# Patient Record
Sex: Male | Born: 1945 | Race: White | Hispanic: No | Marital: Married
Health system: Southern US, Academic
[De-identification: ages and names within clinical notes are randomized; demographics above are authoritative.]

## PROBLEM LIST (undated history)

## (undated) ENCOUNTER — Ambulatory Visit

## (undated) ENCOUNTER — Encounter

## (undated) ENCOUNTER — Encounter: Attending: "Endocrinology | Primary: "Endocrinology

## (undated) ENCOUNTER — Telehealth

## (undated) ENCOUNTER — Ambulatory Visit: Payer: MEDICARE | Attending: Internal Medicine | Primary: Internal Medicine

## (undated) ENCOUNTER — Ambulatory Visit: Payer: MEDICARE

## (undated) ENCOUNTER — Encounter: Attending: Nephrology | Primary: Nephrology

## (undated) ENCOUNTER — Telehealth: Attending: "Endocrinology | Primary: "Endocrinology

## (undated) ENCOUNTER — Ambulatory Visit: Payer: MEDICARE | Attending: Nephrology | Primary: Nephrology

## (undated) ENCOUNTER — Telehealth: Attending: Vascular Surgery | Primary: Vascular Surgery

## (undated) ENCOUNTER — Encounter: Attending: Internal Medicine | Primary: Internal Medicine

## (undated) ENCOUNTER — Ambulatory Visit: Payer: MEDICARE | Attending: Vascular Surgery | Primary: Vascular Surgery

## (undated) ENCOUNTER — Institutional Professional Consult (permissible substitution): Payer: MEDICARE | Attending: "Endocrinology | Primary: "Endocrinology

## (undated) ENCOUNTER — Ambulatory Visit: Payer: MEDICARE | Attending: "Endocrinology | Primary: "Endocrinology

## (undated) ENCOUNTER — Ambulatory Visit
Payer: MEDICARE | Attending: Student in an Organized Health Care Education/Training Program | Primary: Student in an Organized Health Care Education/Training Program

## (undated) DIAGNOSIS — M1990 Unspecified osteoarthritis, unspecified site: Secondary | ICD-10-CM

## (undated) DIAGNOSIS — G4730 Sleep apnea, unspecified: Secondary | ICD-10-CM

## (undated) DIAGNOSIS — L570 Actinic keratosis: Secondary | ICD-10-CM

## (undated) DIAGNOSIS — Z940 Kidney transplant status: Secondary | ICD-10-CM

## (undated) DIAGNOSIS — I639 Cerebral infarction, unspecified: Secondary | ICD-10-CM

## (undated) DIAGNOSIS — I2699 Other pulmonary embolism without acute cor pulmonale: Secondary | ICD-10-CM

## (undated) DIAGNOSIS — N184 Chronic kidney disease, stage 4 (severe): Secondary | ICD-10-CM

## (undated) DIAGNOSIS — I10 Essential (primary) hypertension: Secondary | ICD-10-CM

## (undated) DIAGNOSIS — G5790 Unspecified mononeuropathy of unspecified lower limb: Secondary | ICD-10-CM

## (undated) HISTORY — PX: KIDNEY TRANSPLANT: SHX239

## (undated) HISTORY — DX: Unspecified mononeuropathy of unspecified lower limb: G57.90

## (undated) HISTORY — DX: Unspecified osteoarthritis, unspecified site: M19.90

## (undated) HISTORY — DX: Essential (primary) hypertension: I10

## (undated) HISTORY — DX: Cerebral infarction, unspecified: I63.9

## (undated) HISTORY — PX: TOE SURGERY: SHX1073

## (undated) HISTORY — DX: Kidney transplant status: Z94.0

## (undated) HISTORY — DX: Chronic kidney disease, stage 4 (severe): N18.4

## (undated) HISTORY — DX: Sleep apnea, unspecified: G47.30

## (undated) HISTORY — DX: Actinic keratosis: L57.0

## (undated) MED ORDER — ASPIRIN 81 MG TABLET,DELAYED RELEASE: Freq: Every day | ORAL | 0 days | PRN

---
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
## 1977-06-04 HISTORY — PX: VASECTOMY: SHX75

## 2006-04-02 ENCOUNTER — Ambulatory Visit: Payer: Self-pay | Admitting: Gastroenterology

## 2006-04-24 ENCOUNTER — Ambulatory Visit: Payer: Self-pay | Admitting: Gastroenterology

## 2006-06-04 HISTORY — PX: KNEE ARTHROSCOPY: SUR90

## 2006-10-22 ENCOUNTER — Ambulatory Visit: Payer: Self-pay

## 2006-11-12 ENCOUNTER — Ambulatory Visit: Payer: Self-pay | Admitting: Orthopaedic Surgery

## 2006-11-22 ENCOUNTER — Ambulatory Visit: Payer: Self-pay | Admitting: Orthopaedic Surgery

## 2006-11-26 ENCOUNTER — Ambulatory Visit: Payer: Self-pay | Admitting: Orthopaedic Surgery

## 2006-12-24 ENCOUNTER — Ambulatory Visit: Payer: Self-pay | Admitting: Internal Medicine

## 2007-06-05 HISTORY — PX: CATARACT EXTRACTION: SUR2

## 2008-01-16 ENCOUNTER — Ambulatory Visit: Payer: Self-pay | Admitting: Urology

## 2008-07-08 ENCOUNTER — Telehealth: Payer: Self-pay | Admitting: Gastroenterology

## 2009-02-28 ENCOUNTER — Ambulatory Visit: Payer: Self-pay | Admitting: Ophthalmology

## 2010-08-25 ENCOUNTER — Ambulatory Visit: Payer: Self-pay

## 2010-09-03 ENCOUNTER — Ambulatory Visit: Payer: Self-pay

## 2010-10-03 ENCOUNTER — Ambulatory Visit: Payer: Self-pay

## 2011-01-23 ENCOUNTER — Encounter: Payer: Self-pay | Admitting: Gastroenterology

## 2011-02-22 ENCOUNTER — Ambulatory Visit (AMBULATORY_SURGERY_CENTER): Payer: BC Managed Care – PPO | Admitting: *Deleted

## 2011-02-22 ENCOUNTER — Encounter: Payer: Self-pay | Admitting: Gastroenterology

## 2011-02-22 VITALS — Ht 73.0 in | Wt 258.5 lb

## 2011-02-22 DIAGNOSIS — Z1211 Encounter for screening for malignant neoplasm of colon: Secondary | ICD-10-CM

## 2011-02-22 MED ORDER — PEG-KCL-NACL-NASULF-NA ASC-C 100 G PO SOLR: ORAL | Status: DC

## 2011-03-09 ENCOUNTER — Encounter: Payer: Self-pay | Admitting: Gastroenterology

## 2011-03-09 ENCOUNTER — Ambulatory Visit (AMBULATORY_SURGERY_CENTER): Payer: BC Managed Care – PPO | Admitting: Gastroenterology

## 2011-03-09 VITALS — BP 144/68 | HR 65 | Temp 98.7°F | Resp 14 | Ht 73.0 in | Wt 258.0 lb

## 2011-03-09 DIAGNOSIS — D126 Benign neoplasm of colon, unspecified: Secondary | ICD-10-CM | POA: Insufficient documentation

## 2011-03-09 DIAGNOSIS — Z860100 Personal history of colon polyps, unspecified: Secondary | ICD-10-CM | POA: Insufficient documentation

## 2011-03-09 DIAGNOSIS — Z1211 Encounter for screening for malignant neoplasm of colon: Secondary | ICD-10-CM

## 2011-03-09 DIAGNOSIS — K5730 Diverticulosis of large intestine without perforation or abscess without bleeding: Secondary | ICD-10-CM | POA: Insufficient documentation

## 2011-03-09 MED ORDER — SODIUM CHLORIDE 0.9 % IV SOLN: 500.0000 mL | INTRAVENOUS | Status: DC

## 2011-03-09 NOTE — Patient Instructions (Signed)
Moderate diverticulosis, multiple polyps, await pathology results  See blue and green sheets for additional d/c instructions.

## 2011-03-09 NOTE — Progress Notes (Signed)
Pt states yesterday with first bowel prep passed out about 5pm and when wife returned home at 7pm was still unresponsive. Called ems and they arrived and determined pts blood sugar low. Dr Henrene Pastor notified of episode last pm, pt instructed to continue prep as directed per pt and pt did with no further complications. Blood sugar in admitting was 234. Pt alert and denies and problems. EWM

## 2011-03-12 ENCOUNTER — Telehealth: Payer: Self-pay | Admitting: *Deleted

## 2011-03-12 DIAGNOSIS — D126 Benign neoplasm of colon, unspecified: Secondary | ICD-10-CM

## 2011-03-12 NOTE — Telephone Encounter (Signed)

## 2011-03-13 ENCOUNTER — Encounter: Payer: Self-pay | Admitting: Gastroenterology

## 2011-06-12 ENCOUNTER — Emergency Department: Payer: Self-pay | Admitting: Emergency Medicine

## 2011-06-13 LAB — COMPREHENSIVE METABOLIC PANEL
Albumin: 3.6 g/dL (ref 3.4–5.0)
Alkaline Phosphatase: 67 U/L (ref 50–136)
BUN: 99 mg/dL — ABNORMAL HIGH (ref 7–18)
Bilirubin,Total: 0.4 mg/dL (ref 0.2–1.0)
Co2: 24 mmol/L (ref 21–32)
Creatinine: 4.36 mg/dL — ABNORMAL HIGH (ref 0.60–1.30)
EGFR (African American): 18 — ABNORMAL LOW
EGFR (Non-African Amer.): 15 — ABNORMAL LOW
SGOT(AST): 18 U/L (ref 15–37)
SGPT (ALT): 23 U/L
Total Protein: 7.3 g/dL (ref 6.4–8.2)

## 2011-06-13 LAB — CBC WITH DIFFERENTIAL/PLATELET
Basophil #: 0 10*3/uL (ref 0.0–0.1)
Eosinophil #: 0 10*3/uL (ref 0.0–0.7)
Eosinophil %: 0.1 %
HGB: 11.2 g/dL — ABNORMAL LOW (ref 13.0–18.0)
MCH: 29 pg (ref 26.0–34.0)
MCHC: 33.8 g/dL (ref 32.0–36.0)
MCV: 86 fL (ref 80–100)
Neutrophil #: 7.4 10*3/uL — ABNORMAL HIGH (ref 1.4–6.5)
Neutrophil %: 87.9 %
Platelet: 150 10*3/uL (ref 150–440)
RBC: 3.86 10*6/uL — ABNORMAL LOW (ref 4.40–5.90)
RDW: 14.1 % (ref 11.5–14.5)

## 2011-06-13 LAB — CK TOTAL AND CKMB (NOT AT ARMC)
CK, Total: 164 U/L (ref 35–232)
CK-MB: 5 ng/mL — ABNORMAL HIGH (ref 0.5–3.6)

## 2011-06-13 LAB — TROPONIN I: Troponin-I: 0.03 ng/mL

## 2011-06-13 LAB — PRO B NATRIURETIC PEPTIDE: B-Type Natriuretic Peptide: 1872 pg/mL — ABNORMAL HIGH (ref 0–125)

## 2011-08-27 ENCOUNTER — Other Ambulatory Visit: Payer: Self-pay

## 2011-08-27 LAB — CBC WITH DIFFERENTIAL/PLATELET
Basophil #: 0 10*3/uL (ref 0.0–0.1)
HCT: 40.4 % (ref 40.0–52.0)
Lymphocyte %: 0.6 %
MCH: 27.5 pg (ref 26.0–34.0)
MCV: 86 fL (ref 80–100)
Monocyte #: 0.2 10*3/uL (ref 0.0–0.7)
Monocyte %: 1.3 %
Neutrophil #: 17.4 10*3/uL — ABNORMAL HIGH (ref 1.4–6.5)
Neutrophil %: 98.1 %
RDW: 14.2 % (ref 11.5–14.5)

## 2011-08-27 LAB — BASIC METABOLIC PANEL
Anion Gap: 11 (ref 7–16)
BUN: 47 mg/dL — ABNORMAL HIGH (ref 7–18)
Creatinine: 1.4 mg/dL — ABNORMAL HIGH (ref 0.60–1.30)
Glucose: 176 mg/dL — ABNORMAL HIGH (ref 65–99)
Osmolality: 294 (ref 275–301)
Potassium: 5.2 mmol/L — ABNORMAL HIGH (ref 3.5–5.1)
Sodium: 139 mmol/L (ref 136–145)

## 2011-08-27 LAB — MAGNESIUM: Magnesium: 1.9 mg/dL

## 2011-08-27 LAB — PROTIME-INR
INR: 1.7
Prothrombin Time: 20.7 secs — ABNORMAL HIGH (ref 11.5–14.7)

## 2011-08-29 LAB — CBC WITH DIFFERENTIAL/PLATELET
Eosinophil %: 0 %
HCT: 37.1 % — ABNORMAL LOW (ref 40.0–52.0)
HGB: 12.4 g/dL — ABNORMAL LOW (ref 13.0–18.0)
Lymphocyte #: 0.1 10*3/uL — ABNORMAL LOW (ref 1.0–3.6)
MCH: 28.6 pg (ref 26.0–34.0)
MCHC: 33.5 g/dL (ref 32.0–36.0)
MCV: 85 fL (ref 80–100)
Monocyte %: 2.6 %
Neutrophil %: 96.3 %

## 2011-08-29 LAB — BASIC METABOLIC PANEL
Anion Gap: 10 (ref 7–16)
Chloride: 102 mmol/L (ref 98–107)
Co2: 25 mmol/L (ref 21–32)
Creatinine: 1.65 mg/dL — ABNORMAL HIGH (ref 0.60–1.30)
EGFR (African American): 54 — ABNORMAL LOW
Glucose: 105 mg/dL — ABNORMAL HIGH (ref 65–99)

## 2011-08-29 LAB — MAGNESIUM: Magnesium: 1.8 mg/dL

## 2011-08-29 LAB — PROTIME-INR: Prothrombin Time: 25.2 secs — ABNORMAL HIGH (ref 11.5–14.7)

## 2011-09-03 ENCOUNTER — Other Ambulatory Visit: Payer: Self-pay

## 2011-09-03 LAB — CBC WITH DIFFERENTIAL/PLATELET
Basophil #: 0 10*3/uL (ref 0.0–0.1)
Basophil %: 0.1 %
Eosinophil %: 0.1 %
HCT: 38.7 % — ABNORMAL LOW (ref 40.0–52.0)
Lymphocyte #: 0.1 10*3/uL — ABNORMAL LOW (ref 1.0–3.6)
Lymphocyte %: 1.1 %
MCHC: 32.6 g/dL (ref 32.0–36.0)
Monocyte #: 0.6 10*3/uL (ref 0.0–0.7)
Monocyte %: 4.6 %
Neutrophil #: 11.7 10*3/uL — ABNORMAL HIGH (ref 1.4–6.5)
Neutrophil %: 94.1 %
RBC: 4.42 10*6/uL (ref 4.40–5.90)
RDW: 15 % — ABNORMAL HIGH (ref 11.5–14.5)
WBC: 12.4 10*3/uL — ABNORMAL HIGH (ref 3.8–10.6)

## 2011-09-03 LAB — BASIC METABOLIC PANEL
BUN: 9 mg/dL (ref 7–18)
Creatinine: 1.09 mg/dL (ref 0.60–1.30)
EGFR (African American): >60
EGFR (Non-African Amer.): >60
Glucose: 261 mg/dL — ABNORMAL HIGH (ref 65–99)
Osmolality: 280 (ref 275–301)
Potassium: 4.8 mmol/L (ref 3.5–5.1)

## 2011-09-03 LAB — MAGNESIUM: Magnesium: 1.1 mg/dL — ABNORMAL LOW

## 2011-09-05 LAB — CBC WITH DIFFERENTIAL/PLATELET
Basophil #: 0 10*3/uL (ref 0.0–0.1)
Basophil %: 0 %
HCT: 36.3 % — ABNORMAL LOW (ref 40.0–52.0)
Lymphocyte #: 0.1 10*3/uL — ABNORMAL LOW (ref 1.0–3.6)
MCH: 28.6 pg (ref 26.0–34.0)
MCHC: 33.7 g/dL (ref 32.0–36.0)
MCV: 85 fL (ref 80–100)
Monocyte #: 0.4 10*3/uL (ref 0.0–0.7)
Neutrophil %: 93.4 %
RBC: 4.27 10*6/uL — ABNORMAL LOW (ref 4.40–5.90)
RDW: 16.1 % — ABNORMAL HIGH (ref 11.5–14.5)
WBC: 8.1 10*3/uL (ref 3.8–10.6)

## 2011-09-05 LAB — MAGNESIUM: Magnesium: 1 mg/dL — ABNORMAL LOW

## 2011-09-05 LAB — BASIC METABOLIC PANEL
BUN: 15 mg/dL (ref 7–18)
Chloride: 102 mmol/L (ref 98–107)
Co2: 25 mmol/L (ref 21–32)
Creatinine: 0.86 mg/dL (ref 0.60–1.30)
Osmolality: 275 (ref 275–301)
Potassium: 5 mmol/L (ref 3.5–5.1)

## 2011-09-05 LAB — PHOSPHORUS: Phosphorus: 2.5 mg/dL (ref 2.5–4.9)

## 2011-09-10 LAB — CBC WITH DIFFERENTIAL/PLATELET
Basophil #: 0 10*3/uL (ref 0.0–0.1)
Eosinophil %: 0.2 %
HGB: 12.8 g/dL — ABNORMAL LOW (ref 13.0–18.0)
Lymphocyte %: 1.6 %
MCH: 28.7 pg (ref 26.0–34.0)
MCV: 86 fL (ref 80–100)
Monocyte #: 0.5 10*3/uL (ref 0.0–0.7)
Monocyte %: 5.1 %
Neutrophil %: 93.1 %
Platelet: 320 10*3/uL (ref 150–440)
RBC: 4.46 10*6/uL (ref 4.40–5.90)
RDW: 15.9 % — ABNORMAL HIGH (ref 11.5–14.5)

## 2011-09-10 LAB — BASIC METABOLIC PANEL
BUN: 22 mg/dL — ABNORMAL HIGH (ref 7–18)
Calcium, Total: 8.6 mg/dL (ref 8.5–10.1)
Glucose: 216 mg/dL — ABNORMAL HIGH (ref 65–99)
Osmolality: 284 (ref 275–301)
Sodium: 137 mmol/L (ref 136–145)

## 2011-09-10 LAB — MAGNESIUM: Magnesium: 1.2 mg/dL — ABNORMAL LOW

## 2011-09-12 LAB — BASIC METABOLIC PANEL
Anion Gap: 7 (ref 7–16)
Chloride: 103 mmol/L (ref 98–107)
Co2: 28 mmol/L (ref 21–32)
Creatinine: 1.23 mg/dL (ref 0.60–1.30)
EGFR (African American): >60
Glucose: 178 mg/dL — ABNORMAL HIGH (ref 65–99)
Osmolality: 285 (ref 275–301)
Potassium: 5.4 mmol/L — ABNORMAL HIGH (ref 3.5–5.1)
Sodium: 138 mmol/L (ref 136–145)

## 2011-09-12 LAB — CBC WITH DIFFERENTIAL/PLATELET
Eosinophil #: 0 10*3/uL (ref 0.0–0.7)
Eosinophil %: 0.2 %
HCT: 38.1 % — ABNORMAL LOW (ref 40.0–52.0)
HGB: 12.5 g/dL — ABNORMAL LOW (ref 13.0–18.0)
Lymphocyte %: 2.6 %
MCH: 28.4 pg (ref 26.0–34.0)
MCV: 87 fL (ref 80–100)
Monocyte #: 0.5 x10 3/mm (ref 0.2–1.0)
Monocyte %: 5.8 %
Neutrophil #: 7.2 10*3/uL — ABNORMAL HIGH (ref 1.4–6.5)
Neutrophil %: 91.4 %

## 2011-09-12 LAB — MAGNESIUM: Magnesium: 1.3 mg/dL — ABNORMAL LOW

## 2011-09-12 LAB — PROTIME-INR: Prothrombin Time: 22.5 secs — ABNORMAL HIGH (ref 11.5–14.7)

## 2011-09-14 LAB — CBC WITH DIFFERENTIAL/PLATELET
Basophil #: 0 10*3/uL (ref 0.0–0.1)
Basophil %: 0.2 %
Eosinophil %: 0.6 %
HGB: 12.5 g/dL — ABNORMAL LOW (ref 13.0–18.0)
MCH: 27.9 pg (ref 26.0–34.0)
MCHC: 33.2 g/dL (ref 32.0–36.0)
MCV: 84 fL (ref 80–100)
Monocyte #: 0.6 x10 3/mm (ref 0.2–1.0)
Monocyte %: 8.7 %
Neutrophil %: 88.7 %
Platelet: 313 10*3/uL (ref 150–440)
RDW: 15.7 % — ABNORMAL HIGH (ref 11.5–14.5)

## 2011-09-14 LAB — BASIC METABOLIC PANEL
Chloride: 99 mmol/L (ref 98–107)
Creatinine: 1.17 mg/dL (ref 0.60–1.30)
EGFR (African American): >60
EGFR (Non-African Amer.): >60
Glucose: 291 mg/dL — ABNORMAL HIGH (ref 65–99)
Osmolality: 281 (ref 275–301)
Potassium: 4.8 mmol/L (ref 3.5–5.1)
Sodium: 133 mmol/L — ABNORMAL LOW (ref 136–145)

## 2011-09-14 LAB — PROTIME-INR
INR: 2
Prothrombin Time: 22.9 secs — ABNORMAL HIGH (ref 11.5–14.7)

## 2011-09-14 LAB — PHOSPHORUS: Phosphorus: 2.7 mg/dL (ref 2.5–4.9)

## 2011-09-14 LAB — MAGNESIUM: Magnesium: 1.3 mg/dL — ABNORMAL LOW

## 2011-09-19 LAB — BASIC METABOLIC PANEL
Anion Gap: 8 (ref 7–16)
BUN: 22 mg/dL — ABNORMAL HIGH (ref 7–18)
Calcium, Total: 8.9 mg/dL (ref 8.5–10.1)
Chloride: 99 mmol/L (ref 98–107)
Co2: 28 mmol/L (ref 21–32)
Creatinine: 1.24 mg/dL (ref 0.60–1.30)
EGFR (Non-African Amer.): >60
Glucose: 300 mg/dL — ABNORMAL HIGH (ref 65–99)
Osmolality: 285 (ref 275–301)
Sodium: 135 mmol/L — ABNORMAL LOW (ref 136–145)

## 2011-09-19 LAB — PROTIME-INR
INR: 1.9
Prothrombin Time: 22 secs — ABNORMAL HIGH (ref 11.5–14.7)

## 2011-09-19 LAB — PHOSPHORUS: Phosphorus: 3.2 mg/dL (ref 2.5–4.9)

## 2011-09-19 LAB — CBC WITH DIFFERENTIAL/PLATELET
Eosinophil %: 0.4 %
HGB: 12.7 g/dL — ABNORMAL LOW (ref 13.0–18.0)
Lymphocyte #: 0.2 10*3/uL — ABNORMAL LOW (ref 1.0–3.6)
Lymphocyte %: 2.4 %
MCHC: 33.3 g/dL (ref 32.0–36.0)
Monocyte %: 10.1 %
Platelet: 344 10*3/uL (ref 150–440)
RBC: 4.51 10*6/uL (ref 4.40–5.90)
RDW: 15.8 % — ABNORMAL HIGH (ref 11.5–14.5)
WBC: 7.7 10*3/uL (ref 3.8–10.6)

## 2011-09-19 LAB — MAGNESIUM: Magnesium: 1.3 mg/dL — ABNORMAL LOW

## 2011-09-21 LAB — BASIC METABOLIC PANEL
Anion Gap: 8 (ref 7–16)
BUN: 22 mg/dL — ABNORMAL HIGH (ref 7–18)
Creatinine: 1.27 mg/dL (ref 0.60–1.30)
EGFR (Non-African Amer.): 59 — ABNORMAL LOW
Glucose: 278 mg/dL — ABNORMAL HIGH (ref 65–99)

## 2011-09-21 LAB — CBC WITH DIFFERENTIAL/PLATELET
Basophil #: 0 10*3/uL (ref 0.0–0.1)
Basophil %: 0.3 %
HCT: 38.3 % — ABNORMAL LOW (ref 40.0–52.0)
Lymphocyte %: 1.9 %
MCH: 27.7 pg (ref 26.0–34.0)
MCHC: 33 g/dL (ref 32.0–36.0)
MCV: 84 fL (ref 80–100)
Monocyte #: 0.8 x10 3/mm (ref 0.2–1.0)
Monocyte %: 8.9 %
Neutrophil #: 7.4 10*3/uL — ABNORMAL HIGH (ref 1.4–6.5)
RDW: 15.7 % — ABNORMAL HIGH (ref 11.5–14.5)
WBC: 8.4 10*3/uL (ref 3.8–10.6)

## 2011-09-21 LAB — PROTIME-INR
INR: 2
Prothrombin Time: 23.3 secs — ABNORMAL HIGH (ref 11.5–14.7)

## 2011-09-21 LAB — MAGNESIUM: Magnesium: 1.3 mg/dL — ABNORMAL LOW

## 2011-09-21 LAB — PHOSPHORUS: Phosphorus: 2.9 mg/dL (ref 2.5–4.9)

## 2011-09-24 LAB — CBC WITH DIFFERENTIAL/PLATELET
Basophil %: 0.3 %
Eosinophil #: 0.1 10*3/uL (ref 0.0–0.7)
HGB: 13 g/dL (ref 13.0–18.0)
Lymphocyte #: 0.2 10*3/uL — ABNORMAL LOW (ref 1.0–3.6)
MCV: 84 fL (ref 80–100)
Monocyte %: 7.5 %
Neutrophil #: 10.6 10*3/uL — ABNORMAL HIGH (ref 1.4–6.5)
Neutrophil %: 89.5 %
Platelet: 368 10*3/uL (ref 150–440)

## 2011-09-24 LAB — PROTIME-INR: INR: 2.1

## 2011-09-24 LAB — BASIC METABOLIC PANEL
Anion Gap: 7 (ref 7–16)
BUN: 24 mg/dL — ABNORMAL HIGH (ref 7–18)
Chloride: 101 mmol/L (ref 98–107)
Co2: 28 mmol/L (ref 21–32)
Creatinine: 1.24 mg/dL (ref 0.60–1.30)
EGFR (Non-African Amer.): >60
Glucose: 255 mg/dL — ABNORMAL HIGH (ref 65–99)
Osmolality: 285 (ref 275–301)
Sodium: 136 mmol/L (ref 136–145)

## 2011-09-28 LAB — BASIC METABOLIC PANEL
Anion Gap: 11 (ref 7–16)
BUN: 24 mg/dL — ABNORMAL HIGH (ref 7–18)
Chloride: 101 mmol/L (ref 98–107)
Creatinine: 1.28 mg/dL (ref 0.60–1.30)
Potassium: 5 mmol/L (ref 3.5–5.1)
Sodium: 136 mmol/L (ref 136–145)

## 2011-09-28 LAB — CBC WITH DIFFERENTIAL/PLATELET
Basophil #: 0 10*3/uL (ref 0.0–0.1)
Basophil %: 0.2 %
HCT: 38.3 % — ABNORMAL LOW (ref 40.0–52.0)
Lymphocyte #: 0.2 10*3/uL — ABNORMAL LOW (ref 1.0–3.6)
Lymphocyte %: 2.3 %
MCHC: 33.7 g/dL (ref 32.0–36.0)
MCV: 85 fL (ref 80–100)
Monocyte #: 0.6 x10 3/mm (ref 0.2–1.0)
Neutrophil %: 90.7 %
Platelet: 286 10*3/uL (ref 150–440)
RDW: 15.9 % — ABNORMAL HIGH (ref 11.5–14.5)
WBC: 9.9 10*3/uL (ref 3.8–10.6)

## 2011-09-28 LAB — PHOSPHORUS: Phosphorus: 3.1 mg/dL (ref 2.5–4.9)

## 2011-09-28 LAB — MAGNESIUM: Magnesium: 1.4 mg/dL — ABNORMAL LOW

## 2011-09-28 LAB — APTT: Activated PTT: 42.7 secs — ABNORMAL HIGH (ref 23.6–35.9)

## 2011-09-28 LAB — PROTIME-INR: Prothrombin Time: 24 secs — ABNORMAL HIGH (ref 11.5–14.7)

## 2011-10-01 LAB — CBC WITH DIFFERENTIAL/PLATELET
Basophil #: 0 10*3/uL (ref 0.0–0.1)
Eosinophil #: 0.2 10*3/uL (ref 0.0–0.7)
Eosinophil %: 1.7 %
HCT: 38 % — ABNORMAL LOW (ref 40.0–52.0)
HGB: 12.9 g/dL — ABNORMAL LOW (ref 13.0–18.0)
Lymphocyte %: 1.9 %
MCH: 28.4 pg (ref 26.0–34.0)
MCHC: 34.1 g/dL (ref 32.0–36.0)
MCV: 83 fL (ref 80–100)
Monocyte %: 6.5 %
Neutrophil %: 89.6 %
Platelet: 276 10*3/uL (ref 150–440)
RBC: 4.55 10*6/uL (ref 4.40–5.90)
WBC: 10.1 10*3/uL (ref 3.8–10.6)

## 2011-10-01 LAB — BASIC METABOLIC PANEL
Anion Gap: 8 (ref 7–16)
Calcium, Total: 9 mg/dL (ref 8.5–10.1)
Chloride: 102 mmol/L (ref 98–107)
Co2: 27 mmol/L (ref 21–32)
Creatinine: 1.1 mg/dL (ref 0.60–1.30)
EGFR (Non-African Amer.): >60
Glucose: 253 mg/dL — ABNORMAL HIGH (ref 65–99)
Osmolality: 286 (ref 275–301)
Potassium: 4.9 mmol/L (ref 3.5–5.1)

## 2011-10-01 LAB — PROTIME-INR: INR: 1.7

## 2011-10-03 ENCOUNTER — Other Ambulatory Visit: Payer: Self-pay

## 2011-10-08 LAB — CBC WITH DIFFERENTIAL/PLATELET
Basophil %: 0.1 %
Eosinophil %: 2.7 %
HCT: 37.1 % — ABNORMAL LOW (ref 40.0–52.0)
HGB: 12.7 g/dL — ABNORMAL LOW (ref 13.0–18.0)
Lymphocyte #: 0.3 10*3/uL — ABNORMAL LOW (ref 1.0–3.6)
Lymphocyte %: 3.8 %
MCV: 84 fL (ref 80–100)
Monocyte #: 0.5 x10 3/mm (ref 0.2–1.0)
Monocyte %: 6.6 %
Neutrophil #: 6.8 10*3/uL — ABNORMAL HIGH (ref 1.4–6.5)
Platelet: 265 10*3/uL (ref 150–440)
RBC: 4.4 10*6/uL (ref 4.40–5.90)

## 2011-10-08 LAB — BASIC METABOLIC PANEL
BUN: 21 mg/dL — ABNORMAL HIGH (ref 7–18)
Calcium, Total: 8.5 mg/dL (ref 8.5–10.1)
Osmolality: 290 (ref 275–301)
Potassium: 4.2 mmol/L (ref 3.5–5.1)
Sodium: 140 mmol/L (ref 136–145)

## 2011-10-08 LAB — MAGNESIUM: Magnesium: 1.2 mg/dL — ABNORMAL LOW

## 2011-10-15 LAB — CBC WITH DIFFERENTIAL/PLATELET
Basophil #: 0 10*3/uL (ref 0.0–0.1)
Eosinophil #: 0.3 10*3/uL (ref 0.0–0.7)
HCT: 38.4 % — ABNORMAL LOW (ref 40.0–52.0)
Lymphocyte %: 4.2 %
MCHC: 33.6 g/dL (ref 32.0–36.0)
Monocyte #: 0.7 x10 3/mm (ref 0.2–1.0)
Neutrophil %: 83.4 %
Platelet: 277 10*3/uL (ref 150–440)
WBC: 8.3 10*3/uL (ref 3.8–10.6)

## 2011-10-15 LAB — BASIC METABOLIC PANEL
Anion Gap: 9 (ref 7–16)
BUN: 20 mg/dL — ABNORMAL HIGH (ref 7–18)
Co2: 24 mmol/L (ref 21–32)
EGFR (African American): >60
Glucose: 187 mg/dL — ABNORMAL HIGH (ref 65–99)
Osmolality: 285 (ref 275–301)
Potassium: 4.7 mmol/L (ref 3.5–5.1)
Sodium: 139 mmol/L (ref 136–145)

## 2011-10-15 LAB — PROTIME-INR
INR: 2
Prothrombin Time: 22.6 secs — ABNORMAL HIGH (ref 11.5–14.7)

## 2011-10-15 LAB — MAGNESIUM: Magnesium: 1.2 mg/dL — ABNORMAL LOW

## 2011-10-22 LAB — COMPREHENSIVE METABOLIC PANEL
Alkaline Phosphatase: 105 U/L (ref 50–136)
Anion Gap: 10 (ref 7–16)
BUN: 20 mg/dL — ABNORMAL HIGH (ref 7–18)
Co2: 24 mmol/L (ref 21–32)
EGFR (African American): >60
Glucose: 218 mg/dL — ABNORMAL HIGH (ref 65–99)
Osmolality: 289 (ref 275–301)
Potassium: 4.7 mmol/L (ref 3.5–5.1)
SGOT(AST): 24 U/L (ref 15–37)
SGPT (ALT): 24 U/L
Sodium: 140 mmol/L (ref 136–145)
Total Protein: 7.3 g/dL (ref 6.4–8.2)

## 2011-10-22 LAB — CBC WITH DIFFERENTIAL/PLATELET
Eosinophil %: 2.6 %
HGB: 13.1 g/dL (ref 13.0–18.0)
MCH: 29.1 pg (ref 26.0–34.0)
MCV: 85 fL (ref 80–100)
Monocyte #: 0.6 x10 3/mm (ref 0.2–1.0)
Neutrophil #: 5.6 10*3/uL (ref 1.4–6.5)
Platelet: 257 10*3/uL (ref 150–440)
RBC: 4.52 10*6/uL (ref 4.40–5.90)
RDW: 16.2 % — ABNORMAL HIGH (ref 11.5–14.5)

## 2011-10-22 LAB — LIPID PANEL
Cholesterol: 169 mg/dL (ref 0–200)
HDL Cholesterol: 35 mg/dL — ABNORMAL LOW (ref 40–60)
VLDL Cholesterol, Calc: 39 mg/dL (ref 5–40)

## 2011-10-22 LAB — MAGNESIUM: Magnesium: 1.1 mg/dL — ABNORMAL LOW

## 2011-10-22 LAB — PHOSPHORUS: Phosphorus: 3 mg/dL (ref 2.5–4.9)

## 2011-10-29 LAB — CBC WITH DIFFERENTIAL/PLATELET
Basophil %: 0.2 %
HCT: 37.6 % — ABNORMAL LOW (ref 40.0–52.0)
HGB: 12.7 g/dL — ABNORMAL LOW (ref 13.0–18.0)
Lymphocyte %: 5.6 %
MCH: 28.9 pg (ref 26.0–34.0)
MCHC: 33.9 g/dL (ref 32.0–36.0)
MCV: 85 fL (ref 80–100)
Monocyte #: 0.6 x10 3/mm (ref 0.2–1.0)
Monocyte %: 9.2 %
Neutrophil #: 5.6 10*3/uL (ref 1.4–6.5)
Neutrophil %: 82.3 %
RBC: 4.4 10*6/uL (ref 4.40–5.90)
RDW: 15.9 % — ABNORMAL HIGH (ref 11.5–14.5)
WBC: 6.8 10*3/uL (ref 3.8–10.6)

## 2011-10-29 LAB — PHOSPHORUS: Phosphorus: 3.2 mg/dL (ref 2.5–4.9)

## 2011-10-29 LAB — COMPREHENSIVE METABOLIC PANEL
Albumin: 3.3 g/dL — ABNORMAL LOW (ref 3.4–5.0)
Alkaline Phosphatase: 102 U/L (ref 50–136)
Anion Gap: 8 (ref 7–16)
BUN: 21 mg/dL — ABNORMAL HIGH (ref 7–18)
Bilirubin,Total: 0.3 mg/dL (ref 0.2–1.0)
Calcium, Total: 8.5 mg/dL (ref 8.5–10.1)
Co2: 25 mmol/L (ref 21–32)
Creatinine: 1.28 mg/dL (ref 0.60–1.30)
EGFR (African American): >60
EGFR (Non-African Amer.): 59 — ABNORMAL LOW
Glucose: 123 mg/dL — ABNORMAL HIGH (ref 65–99)
Potassium: 4.5 mmol/L (ref 3.5–5.1)
SGOT(AST): 26 U/L (ref 15–37)
SGPT (ALT): 22 U/L
Sodium: 140 mmol/L (ref 136–145)
Total Protein: 7 g/dL (ref 6.4–8.2)

## 2011-10-29 LAB — LIPID PANEL
Cholesterol: 153 mg/dL (ref 0–200)
Ldl Cholesterol, Calc: 89 mg/dL (ref 0–100)
Triglycerides: 152 mg/dL (ref 0–200)
VLDL Cholesterol, Calc: 30 mg/dL (ref 5–40)

## 2011-10-29 LAB — HEPATIC FUNCTION PANEL A (ARMC): Bilirubin, Direct: <0.1 mg/dL (ref 0.00–0.20)

## 2011-11-03 ENCOUNTER — Other Ambulatory Visit: Payer: Self-pay

## 2011-11-05 LAB — CBC WITH DIFFERENTIAL/PLATELET
HGB: 13.1 g/dL (ref 13.0–18.0)
Lymphocyte %: 4.9 %
Monocyte #: 0.5 x10 3/mm (ref 0.2–1.0)
Monocyte %: 8.4 %
Neutrophil #: 5 10*3/uL (ref 1.4–6.5)
Platelet: 247 10*3/uL (ref 150–440)
RBC: 4.5 10*6/uL (ref 4.40–5.90)
RDW: 16.3 % — ABNORMAL HIGH (ref 11.5–14.5)

## 2011-11-05 LAB — MAGNESIUM: Magnesium: 1.2 mg/dL — ABNORMAL LOW

## 2011-11-05 LAB — BASIC METABOLIC PANEL
BUN: 20 mg/dL — ABNORMAL HIGH (ref 7–18)
Chloride: 105 mmol/L (ref 98–107)
Co2: 22 mmol/L (ref 21–32)
Creatinine: 1.26 mg/dL (ref 0.60–1.30)
EGFR (African American): >60
Potassium: 4.7 mmol/L (ref 3.5–5.1)
Sodium: 138 mmol/L (ref 136–145)

## 2011-11-12 LAB — CBC WITH DIFFERENTIAL/PLATELET
Basophil #: 0 10*3/uL (ref 0.0–0.1)
Eosinophil %: 1.8 %
HGB: 13.1 g/dL (ref 13.0–18.0)
Lymphocyte #: 0.3 10*3/uL — ABNORMAL LOW (ref 1.0–3.6)
Lymphocyte %: 5.6 %
MCV: 85 fL (ref 80–100)
Monocyte #: 0.5 x10 3/mm (ref 0.2–1.0)
Monocyte %: 9.1 %
Neutrophil #: 4.9 10*3/uL (ref 1.4–6.5)
RBC: 4.58 10*6/uL (ref 4.40–5.90)
WBC: 5.9 10*3/uL (ref 3.8–10.6)

## 2011-11-12 LAB — BASIC METABOLIC PANEL
Anion Gap: 9 (ref 7–16)
BUN: 24 mg/dL — ABNORMAL HIGH (ref 7–18)
Co2: 23 mmol/L (ref 21–32)
Creatinine: 1.23 mg/dL (ref 0.60–1.30)
EGFR (African American): >60
Glucose: 198 mg/dL — ABNORMAL HIGH (ref 65–99)
Potassium: 4.5 mmol/L (ref 3.5–5.1)
Sodium: 137 mmol/L (ref 136–145)

## 2011-11-12 LAB — HEPATIC FUNCTION PANEL A (ARMC)
Alkaline Phosphatase: 131 U/L (ref 50–136)
Bilirubin, Direct: 0.1 mg/dL (ref 0.00–0.20)
Bilirubin,Total: 0.6 mg/dL (ref 0.2–1.0)
SGPT (ALT): 23 U/L
Total Protein: 7 g/dL (ref 6.4–8.2)

## 2011-11-12 LAB — LIPID PANEL
Cholesterol: 135 mg/dL (ref 0–200)
HDL Cholesterol: 30 mg/dL — ABNORMAL LOW (ref 40–60)
Ldl Cholesterol, Calc: 58 mg/dL (ref 0–100)
VLDL Cholesterol, Calc: 47 mg/dL — ABNORMAL HIGH (ref 5–40)

## 2011-11-19 LAB — BASIC METABOLIC PANEL
Anion Gap: 8 (ref 7–16)
BUN: 24 mg/dL — ABNORMAL HIGH (ref 7–18)
Calcium, Total: 9.2 mg/dL (ref 8.5–10.1)
Chloride: 100 mmol/L (ref 98–107)
Co2: 27 mmol/L (ref 21–32)
Creatinine: 1.46 mg/dL — ABNORMAL HIGH (ref 0.60–1.30)
EGFR (Non-African Amer.): 50 — ABNORMAL LOW
Glucose: 245 mg/dL — ABNORMAL HIGH (ref 65–99)
Osmolality: 282 (ref 275–301)
Sodium: 135 mmol/L — ABNORMAL LOW (ref 136–145)

## 2011-11-19 LAB — CBC WITH DIFFERENTIAL/PLATELET
Basophil #: 0 10*3/uL (ref 0.0–0.1)
HGB: 13.2 g/dL (ref 13.0–18.0)
Lymphocyte #: 0.4 10*3/uL — ABNORMAL LOW (ref 1.0–3.6)
Lymphocyte %: 6.3 %
MCH: 28.8 pg (ref 26.0–34.0)
MCV: 84 fL (ref 80–100)
Monocyte #: 0.6 x10 3/mm (ref 0.2–1.0)
Monocyte %: 10.2 %
Neutrophil #: 4.5 10*3/uL (ref 1.4–6.5)
Neutrophil %: 81.5 %
RDW: 15.3 % — ABNORMAL HIGH (ref 11.5–14.5)
WBC: 5.6 10*3/uL (ref 3.8–10.6)

## 2011-11-19 LAB — HEPATIC FUNCTION PANEL A (ARMC)
Albumin: 3.4 g/dL (ref 3.4–5.0)
Alkaline Phosphatase: 131 U/L (ref 50–136)
Bilirubin,Total: 0.5 mg/dL (ref 0.2–1.0)
SGOT(AST): 29 U/L (ref 15–37)
Total Protein: 7.2 g/dL (ref 6.4–8.2)

## 2011-11-19 LAB — MAGNESIUM: Magnesium: 1.6 mg/dL — ABNORMAL LOW

## 2011-11-19 LAB — PHOSPHORUS: Phosphorus: 3.3 mg/dL (ref 2.5–4.9)

## 2011-11-19 LAB — LIPID PANEL
Triglycerides: 217 mg/dL — ABNORMAL HIGH (ref 0–200)
VLDL Cholesterol, Calc: 43 mg/dL — ABNORMAL HIGH (ref 5–40)

## 2011-11-30 LAB — BASIC METABOLIC PANEL
Anion Gap: 9 (ref 7–16)
BUN: 28 mg/dL — ABNORMAL HIGH (ref 7–18)
Calcium, Total: 9.2 mg/dL (ref 8.5–10.1)
Chloride: 103 mmol/L (ref 98–107)
Creatinine: 1.42 mg/dL — ABNORMAL HIGH (ref 0.60–1.30)
EGFR (African American): >60
EGFR (Non-African Amer.): 52 — ABNORMAL LOW
Glucose: 252 mg/dL — ABNORMAL HIGH (ref 65–99)
Osmolality: 290 (ref 275–301)
Potassium: 4.6 mmol/L (ref 3.5–5.1)
Sodium: 138 mmol/L (ref 136–145)

## 2011-11-30 LAB — CBC WITH DIFFERENTIAL/PLATELET
Basophil #: 0 10*3/uL (ref 0.0–0.1)
Eosinophil %: 1.8 %
HGB: 13.2 g/dL (ref 13.0–18.0)
Lymphocyte #: 0.5 10*3/uL — ABNORMAL LOW (ref 1.0–3.6)
Lymphocyte %: 7.8 %
MCH: 28.7 pg (ref 26.0–34.0)
Monocyte #: 0.6 x10 3/mm (ref 0.2–1.0)
Monocyte %: 8.7 %
Neutrophil #: 5.6 10*3/uL (ref 1.4–6.5)
Neutrophil %: 81.4 %
Platelet: 259 10*3/uL (ref 150–440)
RBC: 4.59 10*6/uL (ref 4.40–5.90)
WBC: 6.9 10*3/uL (ref 3.8–10.6)

## 2011-11-30 LAB — APTT: Activated PTT: 30.9 secs (ref 23.6–35.9)

## 2011-11-30 LAB — PROTIME-INR
INR: 1.2
Prothrombin Time: 15.9 secs — ABNORMAL HIGH (ref 11.5–14.7)

## 2011-11-30 LAB — MAGNESIUM: Magnesium: 1.5 mg/dL — ABNORMAL LOW

## 2011-11-30 LAB — PHOSPHORUS: Phosphorus: 3.6 mg/dL (ref 2.5–4.9)

## 2011-12-03 ENCOUNTER — Other Ambulatory Visit: Payer: Self-pay

## 2011-12-10 LAB — BASIC METABOLIC PANEL
Anion Gap: 7 (ref 7–16)
BUN: 26 mg/dL — ABNORMAL HIGH (ref 7–18)
Chloride: 102 mmol/L (ref 98–107)
Co2: 29 mmol/L (ref 21–32)
Creatinine: 1.58 mg/dL — ABNORMAL HIGH (ref 0.60–1.30)
Glucose: 203 mg/dL — ABNORMAL HIGH (ref 65–99)
Potassium: 5.1 mmol/L (ref 3.5–5.1)
Sodium: 138 mmol/L (ref 136–145)

## 2011-12-10 LAB — CBC WITH DIFFERENTIAL/PLATELET
Basophil #: 0 10*3/uL (ref 0.0–0.1)
Eosinophil #: 0.2 10*3/uL (ref 0.0–0.7)
Lymphocyte %: 6.3 %
MCH: 28.9 pg (ref 26.0–34.0)
MCHC: 34.3 g/dL (ref 32.0–36.0)
Monocyte #: 0.8 x10 3/mm (ref 0.2–1.0)
Neutrophil #: 6.8 10*3/uL — ABNORMAL HIGH (ref 1.4–6.5)
Neutrophil %: 82.2 %
RDW: 14.7 % — ABNORMAL HIGH (ref 11.5–14.5)

## 2011-12-10 LAB — PHOSPHORUS: Phosphorus: 3.3 mg/dL (ref 2.5–4.9)

## 2011-12-17 LAB — BASIC METABOLIC PANEL
Anion Gap: 8 (ref 7–16)
Calcium, Total: 9 mg/dL (ref 8.5–10.1)
Co2: 28 mmol/L (ref 21–32)
Creatinine: 1.79 mg/dL — ABNORMAL HIGH (ref 0.60–1.30)
EGFR (African American): 45 — ABNORMAL LOW
Glucose: 217 mg/dL — ABNORMAL HIGH (ref 65–99)

## 2011-12-17 LAB — CBC WITH DIFFERENTIAL/PLATELET
Basophil #: 0 10*3/uL (ref 0.0–0.1)
Eosinophil #: 0.1 10*3/uL (ref 0.0–0.7)
HGB: 13.2 g/dL (ref 13.0–18.0)
Lymphocyte #: 0.6 10*3/uL — ABNORMAL LOW (ref 1.0–3.6)
MCH: 28.6 pg (ref 26.0–34.0)
MCHC: 34 g/dL (ref 32.0–36.0)
MCV: 84 fL (ref 80–100)
Monocyte #: 0.8 x10 3/mm (ref 0.2–1.0)
Monocyte %: 9 %
Neutrophil %: 82.6 %
Platelet: 265 10*3/uL (ref 150–440)
RBC: 4.62 10*6/uL (ref 4.40–5.90)
RDW: 14.5 % (ref 11.5–14.5)
WBC: 8.9 10*3/uL (ref 3.8–10.6)

## 2011-12-24 LAB — BASIC METABOLIC PANEL
Anion Gap: 13 (ref 7–16)
BUN: 27 mg/dL — ABNORMAL HIGH (ref 7–18)
Calcium, Total: 9.4 mg/dL (ref 8.5–10.1)
Creatinine: 1.51 mg/dL — ABNORMAL HIGH (ref 0.60–1.30)
EGFR (Non-African Amer.): 48 — ABNORMAL LOW
Glucose: 172 mg/dL — ABNORMAL HIGH (ref 65–99)
Potassium: 4.5 mmol/L (ref 3.5–5.1)

## 2011-12-24 LAB — CBC WITH DIFFERENTIAL/PLATELET
Basophil %: 0.2 %
Eosinophil %: 1 %
HCT: 39.6 % — ABNORMAL LOW (ref 40.0–52.0)
HGB: 13.5 g/dL (ref 13.0–18.0)
Lymphocyte #: 0.5 10*3/uL — ABNORMAL LOW (ref 1.0–3.6)
Lymphocyte %: 6.6 %
MCH: 28.3 pg (ref 26.0–34.0)
MCHC: 34.1 g/dL (ref 32.0–36.0)
Monocyte #: 0.7 x10 3/mm (ref 0.2–1.0)
Neutrophil %: 83 %
Platelet: 306 10*3/uL (ref 150–440)
RBC: 4.77 10*6/uL (ref 4.40–5.90)
WBC: 7.3 10*3/uL (ref 3.8–10.6)

## 2011-12-24 LAB — MAGNESIUM: Magnesium: 1.8 mg/dL

## 2011-12-24 LAB — PHOSPHORUS: Phosphorus: 4 mg/dL (ref 2.5–4.9)

## 2012-01-01 LAB — CBC WITH DIFFERENTIAL/PLATELET
Basophil #: 0 10*3/uL (ref 0.0–0.1)
Eosinophil #: 0.1 10*3/uL (ref 0.0–0.7)
Lymphocyte #: 0.8 10*3/uL — ABNORMAL LOW (ref 1.0–3.6)
MCH: 28.6 pg (ref 26.0–34.0)
MCHC: 34.7 g/dL (ref 32.0–36.0)
MCV: 82 fL (ref 80–100)
Monocyte #: 0.8 x10 3/mm (ref 0.2–1.0)
Neutrophil %: 78.5 %
Platelet: 286 10*3/uL (ref 150–440)
RDW: 13.8 % (ref 11.5–14.5)

## 2012-01-01 LAB — BASIC METABOLIC PANEL
Anion Gap: 12 (ref 7–16)
Calcium, Total: 8.7 mg/dL (ref 8.5–10.1)
Chloride: 96 mmol/L — ABNORMAL LOW (ref 98–107)
Creatinine: 2.27 mg/dL — ABNORMAL HIGH (ref 0.60–1.30)
EGFR (African American): 34 — ABNORMAL LOW
EGFR (Non-African Amer.): 29 — ABNORMAL LOW
Glucose: 249 mg/dL — ABNORMAL HIGH (ref 65–99)
Sodium: 136 mmol/L (ref 136–145)

## 2012-01-01 LAB — PHOSPHORUS: Phosphorus: 4.2 mg/dL (ref 2.5–4.9)

## 2012-01-01 LAB — MAGNESIUM: Magnesium: 2 mg/dL

## 2012-01-03 ENCOUNTER — Other Ambulatory Visit: Payer: Self-pay

## 2012-01-03 LAB — BASIC METABOLIC PANEL
BUN: 41 mg/dL — ABNORMAL HIGH (ref 7–18)
Calcium, Total: 9.2 mg/dL (ref 8.5–10.1)
Chloride: 96 mmol/L — ABNORMAL LOW (ref 98–107)
Co2: 30 mmol/L (ref 21–32)
Creatinine: 2.04 mg/dL — ABNORMAL HIGH (ref 0.60–1.30)
EGFR (African American): 39 — ABNORMAL LOW
EGFR (Non-African Amer.): 33 — ABNORMAL LOW
Glucose: 179 mg/dL — ABNORMAL HIGH (ref 65–99)
Osmolality: 287 (ref 275–301)

## 2012-01-03 LAB — MAGNESIUM: Magnesium: 2 mg/dL

## 2012-01-03 LAB — PHOSPHORUS: Phosphorus: 4.3 mg/dL (ref 2.5–4.9)

## 2012-01-14 LAB — CBC WITH DIFFERENTIAL/PLATELET
Basophil %: 0.5 %
Eosinophil #: 0.1 10*3/uL (ref 0.0–0.7)
Eosinophil %: 1.5 %
HCT: 38.5 % — ABNORMAL LOW (ref 40.0–52.0)
HGB: 13.2 g/dL (ref 13.0–18.0)
Lymphocyte %: 6.3 %
MCH: 28.1 pg (ref 26.0–34.0)
MCHC: 34.2 g/dL (ref 32.0–36.0)
Monocyte %: 7.6 %
Neutrophil #: 8 10*3/uL — ABNORMAL HIGH (ref 1.4–6.5)
Platelet: 273 10*3/uL (ref 150–440)
RBC: 4.69 10*6/uL (ref 4.40–5.90)

## 2012-01-14 LAB — PHOSPHORUS: Phosphorus: 2.9 mg/dL (ref 2.5–4.9)

## 2012-01-14 LAB — BASIC METABOLIC PANEL
BUN: 42 mg/dL — ABNORMAL HIGH (ref 7–18)
Calcium, Total: 9.1 mg/dL (ref 8.5–10.1)
Chloride: 95 mmol/L — ABNORMAL LOW (ref 98–107)
Co2: 30 mmol/L (ref 21–32)
EGFR (Non-African Amer.): 34 — ABNORMAL LOW
Glucose: 338 mg/dL — ABNORMAL HIGH (ref 65–99)
Osmolality: 292 (ref 275–301)
Potassium: 3.7 mmol/L (ref 3.5–5.1)
Sodium: 134 mmol/L — ABNORMAL LOW (ref 136–145)

## 2012-01-30 LAB — MAGNESIUM: Magnesium: 2.2 mg/dL

## 2012-01-30 LAB — PHOSPHORUS: Phosphorus: 3.3 mg/dL (ref 2.5–4.9)

## 2012-01-30 LAB — BASIC METABOLIC PANEL
BUN: 41 mg/dL — ABNORMAL HIGH (ref 7–18)
Chloride: 102 mmol/L (ref 98–107)
Co2: 32 mmol/L (ref 21–32)
Glucose: 155 mg/dL — ABNORMAL HIGH (ref 65–99)
Osmolality: 293 (ref 275–301)
Potassium: 3.7 mmol/L (ref 3.5–5.1)
Sodium: 140 mmol/L (ref 136–145)

## 2012-01-30 LAB — CBC WITH DIFFERENTIAL/PLATELET
Basophil %: 0.4 %
Eosinophil %: 2.9 %
HCT: 38.9 % — ABNORMAL LOW (ref 40.0–52.0)
Lymphocyte #: 0.7 10*3/uL — ABNORMAL LOW (ref 1.0–3.6)
MCH: 28.3 pg (ref 26.0–34.0)
MCV: 82 fL (ref 80–100)
Monocyte #: 0.6 x10 3/mm (ref 0.2–1.0)
Monocyte %: 8.8 %
Neutrophil #: 5.3 10*3/uL (ref 1.4–6.5)
RBC: 4.72 10*6/uL (ref 4.40–5.90)
WBC: 6.8 10*3/uL (ref 3.8–10.6)

## 2012-02-03 ENCOUNTER — Other Ambulatory Visit: Payer: Self-pay

## 2012-02-05 LAB — BASIC METABOLIC PANEL
Anion Gap: 7 (ref 7–16)
Calcium, Total: 9 mg/dL (ref 8.5–10.1)
Chloride: 100 mmol/L (ref 98–107)
Co2: 31 mmol/L (ref 21–32)
Creatinine: 1.94 mg/dL — ABNORMAL HIGH (ref 0.60–1.30)
EGFR (African American): 41 — ABNORMAL LOW
Glucose: 176 mg/dL — ABNORMAL HIGH (ref 65–99)
Potassium: 3.8 mmol/L (ref 3.5–5.1)
Sodium: 138 mmol/L (ref 136–145)

## 2012-02-05 LAB — CBC WITH DIFFERENTIAL/PLATELET
Eosinophil #: 0.1 10*3/uL (ref 0.0–0.7)
Eosinophil %: 1.8 %
Lymphocyte %: 7.6 %
MCH: 28.4 pg (ref 26.0–34.0)
MCHC: 34.7 g/dL (ref 32.0–36.0)
Monocyte #: 0.7 x10 3/mm (ref 0.2–1.0)
Neutrophil %: 81.9 %
Platelet: 257 10*3/uL (ref 150–440)
RBC: 4.75 10*6/uL (ref 4.40–5.90)
RDW: 14.2 % (ref 11.5–14.5)

## 2012-02-11 LAB — CBC WITH DIFFERENTIAL/PLATELET
Basophil %: 0.5 %
Eosinophil %: 1.8 %
HCT: 38.8 % — ABNORMAL LOW (ref 40.0–52.0)
HGB: 13.3 g/dL (ref 13.0–18.0)
Lymphocyte %: 7.9 %
MCH: 27.9 pg (ref 26.0–34.0)
MCHC: 34.3 g/dL (ref 32.0–36.0)
MCV: 81 fL (ref 80–100)
Monocyte %: 7.4 %
Neutrophil #: 6.7 10*3/uL — ABNORMAL HIGH (ref 1.4–6.5)
RBC: 4.76 10*6/uL (ref 4.40–5.90)
RDW: 14.2 % (ref 11.5–14.5)

## 2012-02-11 LAB — HEPATIC FUNCTION PANEL A (ARMC)
Albumin: 3.7 g/dL (ref 3.4–5.0)
Bilirubin, Direct: 0.1 mg/dL (ref 0.00–0.20)
SGOT(AST): 34 U/L (ref 15–37)
Total Protein: 7.7 g/dL (ref 6.4–8.2)

## 2012-02-11 LAB — BASIC METABOLIC PANEL
Anion Gap: 6 — ABNORMAL LOW (ref 7–16)
BUN: 42 mg/dL — ABNORMAL HIGH (ref 7–18)
Chloride: 99 mmol/L (ref 98–107)
Creatinine: 1.97 mg/dL — ABNORMAL HIGH (ref 0.60–1.30)
EGFR (African American): 40 — ABNORMAL LOW
EGFR (Non-African Amer.): 35 — ABNORMAL LOW
Glucose: 157 mg/dL — ABNORMAL HIGH (ref 65–99)
Osmolality: 286 (ref 275–301)

## 2012-02-11 LAB — LIPID PANEL
Cholesterol: 150 mg/dL (ref 0–200)
Ldl Cholesterol, Calc: 57 mg/dL (ref 0–100)

## 2012-02-11 LAB — MAGNESIUM: Magnesium: 1.9 mg/dL

## 2012-02-11 LAB — GAMMA GT: GGT: 26 U/L (ref 5–85)

## 2012-02-18 LAB — CBC WITH DIFFERENTIAL/PLATELET
Basophil %: 0.6 %
HCT: 39.3 % — ABNORMAL LOW (ref 40.0–52.0)
HGB: 13.1 g/dL (ref 13.0–18.0)
MCH: 27.3 pg (ref 26.0–34.0)
MCHC: 33.2 g/dL (ref 32.0–36.0)
MCV: 82 fL (ref 80–100)
Monocyte #: 0.6 x10 3/mm (ref 0.2–1.0)
Monocyte %: 8 %
Neutrophil #: 5.9 10*3/uL (ref 1.4–6.5)
Platelet: 285 10*3/uL (ref 150–440)
RDW: 14.5 % (ref 11.5–14.5)
WBC: 7.2 10*3/uL (ref 3.8–10.6)

## 2012-02-18 LAB — COMPREHENSIVE METABOLIC PANEL
Alkaline Phosphatase: 108 U/L (ref 50–136)
BUN: 37 mg/dL — ABNORMAL HIGH (ref 7–18)
Bilirubin,Total: 0.4 mg/dL (ref 0.2–1.0)
Co2: 30 mmol/L (ref 21–32)
Creatinine: 2.02 mg/dL — ABNORMAL HIGH (ref 0.60–1.30)
EGFR (African American): 39 — ABNORMAL LOW
EGFR (Non-African Amer.): 34 — ABNORMAL LOW
SGOT(AST): 24 U/L (ref 15–37)
SGPT (ALT): 24 U/L (ref 12–78)
Sodium: 138 mmol/L (ref 136–145)

## 2012-02-18 LAB — HEMOGLOBIN A1C: Hemoglobin A1C: 7.5 % — ABNORMAL HIGH (ref 4.2–6.3)

## 2012-03-04 ENCOUNTER — Other Ambulatory Visit: Payer: Self-pay

## 2012-03-17 LAB — CBC WITH DIFFERENTIAL/PLATELET
Basophil %: 0.6 %
Eosinophil #: 0.1 10*3/uL (ref 0.0–0.7)
Eosinophil %: 1.7 %
HCT: 40 % (ref 40.0–52.0)
HGB: 13.5 g/dL (ref 13.0–18.0)
Lymphocyte %: 9.1 %
MCH: 27.7 pg (ref 26.0–34.0)
Monocyte #: 0.7 x10 3/mm (ref 0.2–1.0)
Neutrophil %: 78.9 %
RBC: 4.87 10*6/uL (ref 4.40–5.90)

## 2012-03-17 LAB — BASIC METABOLIC PANEL
Anion Gap: 8 (ref 7–16)
BUN: 48 mg/dL — ABNORMAL HIGH (ref 7–18)
Chloride: 99 mmol/L (ref 98–107)
EGFR (Non-African Amer.): 36 — ABNORMAL LOW
Osmolality: 294 (ref 275–301)
Potassium: 3.9 mmol/L (ref 3.5–5.1)

## 2012-04-03 LAB — CBC WITH DIFFERENTIAL/PLATELET
Basophil %: 0.3 %
Eosinophil #: 0.1 10*3/uL (ref 0.0–0.7)
Eosinophil %: 1 %
HCT: 39.3 % — ABNORMAL LOW (ref 40.0–52.0)
HGB: 13.5 g/dL (ref 13.0–18.0)
Lymphocyte #: 0.7 10*3/uL — ABNORMAL LOW (ref 1.0–3.6)
MCH: 27.8 pg (ref 26.0–34.0)
MCHC: 34.3 g/dL (ref 32.0–36.0)
MCV: 81 fL (ref 80–100)
Monocyte #: 0.7 x10 3/mm (ref 0.2–1.0)
Monocyte %: 7.8 %
Neutrophil #: 7.7 10*3/uL — ABNORMAL HIGH (ref 1.4–6.5)
Platelet: 286 10*3/uL (ref 150–440)
RBC: 4.85 10*6/uL (ref 4.40–5.90)
WBC: 9.2 10*3/uL (ref 3.8–10.6)

## 2012-04-03 LAB — BASIC METABOLIC PANEL
Calcium, Total: 8.6 mg/dL (ref 8.5–10.1)
Co2: 27 mmol/L (ref 21–32)
Creatinine: 2.12 mg/dL — ABNORMAL HIGH (ref 0.60–1.30)
Sodium: 139 mmol/L (ref 136–145)

## 2012-04-04 ENCOUNTER — Other Ambulatory Visit: Payer: Self-pay

## 2012-04-14 LAB — CBC WITH DIFFERENTIAL/PLATELET
Basophil %: 0.5 %
Eosinophil %: 1.9 %
HCT: 40.4 % (ref 40.0–52.0)
HGB: 13.9 g/dL (ref 13.0–18.0)
Lymphocyte #: 0.7 10*3/uL — ABNORMAL LOW (ref 1.0–3.6)
Lymphocyte %: 7.6 %
MCH: 28.1 pg (ref 26.0–34.0)
MCV: 82 fL (ref 80–100)
Monocyte #: 0.7 x10 3/mm (ref 0.2–1.0)
Monocyte %: 8.4 %
Neutrophil #: 7.2 10*3/uL — ABNORMAL HIGH (ref 1.4–6.5)
Platelet: 277 10*3/uL (ref 150–440)
RBC: 4.95 10*6/uL (ref 4.40–5.90)
WBC: 8.8 10*3/uL (ref 3.8–10.6)

## 2012-04-14 LAB — BASIC METABOLIC PANEL
Anion Gap: 10 (ref 7–16)
BUN: 55 mg/dL — ABNORMAL HIGH (ref 7–18)
Chloride: 96 mmol/L — ABNORMAL LOW (ref 98–107)
Co2: 29 mmol/L (ref 21–32)
EGFR (African American): 31 — ABNORMAL LOW
Glucose: 189 mg/dL — ABNORMAL HIGH (ref 65–99)
Osmolality: 290 (ref 275–301)
Sodium: 135 mmol/L — ABNORMAL LOW (ref 136–145)

## 2012-04-14 LAB — MAGNESIUM: Magnesium: 2.2 mg/dL

## 2012-04-18 LAB — BASIC METABOLIC PANEL
BUN: 40 mg/dL — ABNORMAL HIGH (ref 7–18)
Calcium, Total: 9.2 mg/dL (ref 8.5–10.1)
Chloride: 101 mmol/L (ref 98–107)
Creatinine: 1.96 mg/dL — ABNORMAL HIGH (ref 0.60–1.30)
EGFR (African American): 41 — ABNORMAL LOW
EGFR (Non-African Amer.): 35 — ABNORMAL LOW
Glucose: 135 mg/dL — ABNORMAL HIGH (ref 65–99)
Osmolality: 287 (ref 275–301)
Potassium: 3.5 mmol/L (ref 3.5–5.1)

## 2012-04-18 LAB — CBC WITH DIFFERENTIAL/PLATELET
Basophil %: 0.8 %
Eosinophil %: 2.2 %
HCT: 39.5 % — ABNORMAL LOW (ref 40.0–52.0)
HGB: 13.6 g/dL (ref 13.0–18.0)
Lymphocyte #: 0.7 10*3/uL — ABNORMAL LOW (ref 1.0–3.6)
Lymphocyte %: 8.2 %
MCH: 27.9 pg (ref 26.0–34.0)
MCV: 81 fL (ref 80–100)
Monocyte %: 9.2 %
Neutrophil #: 6.6 10*3/uL — ABNORMAL HIGH (ref 1.4–6.5)
Platelet: 274 10*3/uL (ref 150–440)
RBC: 4.86 10*6/uL (ref 4.40–5.90)
WBC: 8.3 10*3/uL (ref 3.8–10.6)

## 2012-04-18 LAB — MAGNESIUM: Magnesium: 2.4 mg/dL

## 2012-04-18 LAB — PHOSPHORUS: Phosphorus: 3.5 mg/dL (ref 2.5–4.9)

## 2012-04-28 LAB — CBC WITH DIFFERENTIAL/PLATELET
HCT: 40.6 % (ref 40.0–52.0)
Lymphocyte #: 0.5 10*3/uL — ABNORMAL LOW (ref 1.0–3.6)
MCHC: 34.1 g/dL (ref 32.0–36.0)
MCV: 82 fL (ref 80–100)
Monocyte %: 7.4 %
Neutrophil #: 6.4 10*3/uL (ref 1.4–6.5)
Neutrophil %: 83.1 %
Platelet: 284 10*3/uL (ref 150–440)
RDW: 14.9 % — ABNORMAL HIGH (ref 11.5–14.5)
WBC: 7.7 10*3/uL (ref 3.8–10.6)

## 2012-04-28 LAB — BASIC METABOLIC PANEL
Anion Gap: 6 — ABNORMAL LOW (ref 7–16)
BUN: 38 mg/dL — ABNORMAL HIGH (ref 7–18)
Calcium, Total: 8.9 mg/dL (ref 8.5–10.1)
Chloride: 98 mmol/L (ref 98–107)
Creatinine: 2.2 mg/dL — ABNORMAL HIGH (ref 0.60–1.30)
EGFR (African American): 35 — ABNORMAL LOW
EGFR (Non-African Amer.): 31 — ABNORMAL LOW
Glucose: 166 mg/dL — ABNORMAL HIGH (ref 65–99)
Osmolality: 283 (ref 275–301)

## 2012-04-28 LAB — PHOSPHORUS: Phosphorus: 3.2 mg/dL (ref 2.5–4.9)

## 2012-05-04 ENCOUNTER — Other Ambulatory Visit: Payer: Self-pay

## 2012-05-12 LAB — CBC WITH DIFFERENTIAL/PLATELET
Basophil #: 0.1 10*3/uL (ref 0.0–0.1)
Basophil %: 1.1 %
Eosinophil #: 0.1 10*3/uL (ref 0.0–0.7)
Eosinophil %: 1.3 %
HCT: 40.9 % (ref 40.0–52.0)
HGB: 13.8 g/dL (ref 13.0–18.0)
Lymphocyte #: 0.8 10*3/uL — ABNORMAL LOW (ref 1.0–3.6)
MCH: 27.6 pg (ref 26.0–34.0)
MCHC: 33.8 g/dL (ref 32.0–36.0)
MCV: 82 fL (ref 80–100)
Monocyte #: 0.6 x10 3/mm (ref 0.2–1.0)
Monocyte %: 7.2 %
Neutrophil #: 7.3 10*3/uL — ABNORMAL HIGH (ref 1.4–6.5)
Platelet: 292 10*3/uL (ref 150–440)
RBC: 5.02 10*6/uL (ref 4.40–5.90)
WBC: 9 10*3/uL (ref 3.8–10.6)

## 2012-05-12 LAB — LIPID PANEL
HDL Cholesterol: 31 mg/dL — ABNORMAL LOW (ref 40–60)
Ldl Cholesterol, Calc: 45 mg/dL (ref 0–100)
Triglycerides: 310 mg/dL — ABNORMAL HIGH (ref 0–200)
VLDL Cholesterol, Calc: 62 mg/dL — ABNORMAL HIGH (ref 5–40)

## 2012-05-12 LAB — COMPREHENSIVE METABOLIC PANEL
Alkaline Phosphatase: 117 U/L (ref 50–136)
Anion Gap: 7 (ref 7–16)
BUN: 42 mg/dL — ABNORMAL HIGH (ref 7–18)
Chloride: 99 mmol/L (ref 98–107)
Creatinine: 1.95 mg/dL — ABNORMAL HIGH (ref 0.60–1.30)
EGFR (African American): 41 — ABNORMAL LOW
Glucose: 164 mg/dL — ABNORMAL HIGH (ref 65–99)
SGOT(AST): 27 U/L (ref 15–37)
SGPT (ALT): 28 U/L (ref 12–78)
Total Protein: 7.3 g/dL (ref 6.4–8.2)

## 2012-05-12 LAB — MAGNESIUM: Magnesium: 2.1 mg/dL

## 2012-05-12 LAB — GAMMA GT: GGT: 18 U/L (ref 5–85)

## 2012-05-12 LAB — PHOSPHORUS: Phosphorus: 3.3 mg/dL (ref 2.5–4.9)

## 2012-05-12 LAB — BILIRUBIN, DIRECT: Bilirubin, Direct: <0.1 mg/dL (ref 0.00–0.20)

## 2012-05-26 LAB — CBC WITH DIFFERENTIAL/PLATELET
Eosinophil %: 1.5 %
HCT: 39.1 % — ABNORMAL LOW (ref 40.0–52.0)
Lymphocyte #: 0.8 10*3/uL — ABNORMAL LOW (ref 1.0–3.6)
Lymphocyte %: 9.4 %
MCHC: 33.6 g/dL (ref 32.0–36.0)
MCV: 82 fL (ref 80–100)
Monocyte #: 0.6 x10 3/mm (ref 0.2–1.0)
Monocyte %: 7.3 %
Neutrophil %: 81.4 %
Platelet: 264 10*3/uL (ref 150–440)
RBC: 4.75 10*6/uL (ref 4.40–5.90)
WBC: 8.2 10*3/uL (ref 3.8–10.6)

## 2012-05-26 LAB — BASIC METABOLIC PANEL
Anion Gap: 10 (ref 7–16)
Calcium, Total: 9 mg/dL (ref 8.5–10.1)
Chloride: 102 mmol/L (ref 98–107)
Co2: 26 mmol/L (ref 21–32)
EGFR (African American): 38 — ABNORMAL LOW
Osmolality: 292 (ref 275–301)

## 2012-05-26 LAB — PHOSPHORUS: Phosphorus: 3.2 mg/dL (ref 2.5–4.9)

## 2012-05-26 LAB — MAGNESIUM: Magnesium: 2.3 mg/dL

## 2012-06-04 ENCOUNTER — Other Ambulatory Visit: Payer: Self-pay

## 2012-06-09 LAB — CBC WITH DIFFERENTIAL/PLATELET
Eosinophil #: 0.2 10*3/uL (ref 0.0–0.7)
Eosinophil %: 2.9 %
HGB: 13.5 g/dL (ref 13.0–18.0)
Lymphocyte #: 0.7 10*3/uL — ABNORMAL LOW (ref 1.0–3.6)
MCH: 27.8 pg (ref 26.0–34.0)
MCV: 82 fL (ref 80–100)
Monocyte #: 0.7 x10 3/mm (ref 0.2–1.0)
Monocyte %: 8.5 %
Neutrophil #: 6.3 10*3/uL (ref 1.4–6.5)
Neutrophil %: 79.1 %

## 2012-06-09 LAB — BASIC METABOLIC PANEL
BUN: 33 mg/dL — ABNORMAL HIGH (ref 7–18)
Calcium, Total: 8.8 mg/dL (ref 8.5–10.1)
EGFR (African American): 44 — ABNORMAL LOW
EGFR (Non-African Amer.): 38 — ABNORMAL LOW
Glucose: 103 mg/dL — ABNORMAL HIGH (ref 65–99)
Osmolality: 287 (ref 275–301)
Potassium: 4.1 mmol/L (ref 3.5–5.1)
Sodium: 140 mmol/L (ref 136–145)

## 2012-06-09 LAB — PHOSPHORUS: Phosphorus: 3.4 mg/dL (ref 2.5–4.9)

## 2012-06-09 LAB — MAGNESIUM: Magnesium: 2 mg/dL

## 2012-07-05 ENCOUNTER — Other Ambulatory Visit: Payer: Self-pay

## 2012-07-07 LAB — BASIC METABOLIC PANEL
Anion Gap: 9 (ref 7–16)
Co2: 24 mmol/L (ref 21–32)
Creatinine: 2.11 mg/dL — ABNORMAL HIGH (ref 0.60–1.30)
EGFR (Non-African Amer.): 32 — ABNORMAL LOW
Osmolality: 285 (ref 275–301)
Sodium: 136 mmol/L (ref 136–145)

## 2012-07-07 LAB — CBC WITH DIFFERENTIAL/PLATELET
Basophil %: 0.3 %
Eosinophil %: 2.1 %
Lymphocyte #: 0.9 10*3/uL — ABNORMAL LOW (ref 1.0–3.6)
Lymphocyte %: 9.9 %
MCHC: 34 g/dL (ref 32.0–36.0)
MCV: 82 fL (ref 80–100)
Monocyte #: 0.8 x10 3/mm (ref 0.2–1.0)
Platelet: 275 10*3/uL (ref 150–440)
RBC: 4.96 10*6/uL (ref 4.40–5.90)
RDW: 14.6 % — ABNORMAL HIGH (ref 11.5–14.5)

## 2012-07-07 LAB — PHOSPHORUS: Phosphorus: 3.5 mg/dL (ref 2.5–4.9)

## 2012-07-23 LAB — CBC WITH DIFFERENTIAL/PLATELET
Basophil #: 0 10*3/uL (ref 0.0–0.1)
Basophil %: 0.6 %
HCT: 41.9 % (ref 40.0–52.0)
HGB: 14.2 g/dL (ref 13.0–18.0)
Lymphocyte %: 9.2 %
MCH: 28 pg (ref 26.0–34.0)
MCV: 83 fL (ref 80–100)
Monocyte #: 0.8 x10 3/mm (ref 0.2–1.0)
Monocyte %: 8.6 %
Neutrophil #: 6.9 10*3/uL — ABNORMAL HIGH (ref 1.4–6.5)
Neutrophil %: 79 %
RDW: 14.3 % (ref 11.5–14.5)
WBC: 8.7 10*3/uL (ref 3.8–10.6)

## 2012-07-23 LAB — MAGNESIUM: Magnesium: 2.1 mg/dL

## 2012-07-23 LAB — BASIC METABOLIC PANEL
Anion Gap: 6 — ABNORMAL LOW (ref 7–16)
Calcium, Total: 9 mg/dL (ref 8.5–10.1)
Chloride: 100 mmol/L (ref 98–107)
Creatinine: 2.14 mg/dL — ABNORMAL HIGH (ref 0.60–1.30)
EGFR (Non-African Amer.): 31 — ABNORMAL LOW
Glucose: 115 mg/dL — ABNORMAL HIGH (ref 65–99)
Osmolality: 288 (ref 275–301)

## 2012-08-02 ENCOUNTER — Other Ambulatory Visit: Payer: Self-pay

## 2012-08-04 LAB — CBC WITH DIFFERENTIAL/PLATELET
Basophil %: 0.3 %
Eosinophil %: 1.6 %
Lymphocyte %: 7.8 %
MCHC: 34.2 g/dL (ref 32.0–36.0)
Monocyte #: 0.9 x10 3/mm (ref 0.2–1.0)
Monocyte %: 9.4 %
Neutrophil #: 7.9 10*3/uL — ABNORMAL HIGH (ref 1.4–6.5)
Neutrophil %: 80.9 %
Platelet: 267 10*3/uL (ref 150–440)
RBC: 5.09 10*6/uL (ref 4.40–5.90)
RDW: 14.4 % (ref 11.5–14.5)

## 2012-08-04 LAB — COMPREHENSIVE METABOLIC PANEL
Alkaline Phosphatase: 92 U/L (ref 50–136)
Bilirubin,Total: 0.7 mg/dL (ref 0.2–1.0)
Calcium, Total: 8.9 mg/dL (ref 8.5–10.1)
Chloride: 102 mmol/L (ref 98–107)
Creatinine: 1.88 mg/dL — ABNORMAL HIGH (ref 0.60–1.30)
EGFR (African American): 42 — ABNORMAL LOW
EGFR (Non-African Amer.): 37 — ABNORMAL LOW
Glucose: 183 mg/dL — ABNORMAL HIGH (ref 65–99)
Osmolality: 287 (ref 275–301)
Potassium: 3.7 mmol/L (ref 3.5–5.1)
SGOT(AST): 26 U/L (ref 15–37)
SGPT (ALT): 26 U/L (ref 12–78)
Total Protein: 7.6 g/dL (ref 6.4–8.2)

## 2012-08-04 LAB — LIPID PANEL
Cholesterol: 158 mg/dL (ref 0–200)
HDL Cholesterol: 32 mg/dL — ABNORMAL LOW (ref 40–60)

## 2012-08-04 LAB — GAMMA GT: GGT: 15 U/L (ref 5–85)

## 2012-08-04 LAB — BILIRUBIN, DIRECT: Bilirubin, Direct: 0.1 mg/dL (ref 0.00–0.20)

## 2012-08-04 LAB — HEMOGLOBIN A1C: Hemoglobin A1C: 6 % (ref 4.2–6.3)

## 2012-08-04 LAB — MAGNESIUM: Magnesium: 1.9 mg/dL

## 2012-08-04 LAB — PHOSPHORUS: Phosphorus: 3.5 mg/dL (ref 2.5–4.9)

## 2012-09-02 ENCOUNTER — Other Ambulatory Visit: Payer: Self-pay

## 2012-09-15 LAB — BASIC METABOLIC PANEL
Anion Gap: 7 (ref 7–16)
BUN: 34 mg/dL — ABNORMAL HIGH (ref 7–18)
Calcium, Total: 8.9 mg/dL (ref 8.5–10.1)
Co2: 27 mmol/L (ref 21–32)
Creatinine: 1.85 mg/dL — ABNORMAL HIGH (ref 0.60–1.30)
EGFR (African American): 43 — ABNORMAL LOW
Glucose: 202 mg/dL — ABNORMAL HIGH (ref 65–99)
Osmolality: 287 (ref 275–301)
Potassium: 3.8 mmol/L (ref 3.5–5.1)

## 2012-09-15 LAB — CBC WITH DIFFERENTIAL/PLATELET
Basophil #: 0 10*3/uL (ref 0.0–0.1)
Basophil %: 0.3 %
Eosinophil %: 1.8 %
Lymphocyte #: 0.8 10*3/uL — ABNORMAL LOW (ref 1.0–3.6)
MCH: 27.2 pg (ref 26.0–34.0)
MCHC: 33.2 g/dL (ref 32.0–36.0)
MCV: 82 fL (ref 80–100)
Monocyte #: 0.7 x10 3/mm (ref 0.2–1.0)
Monocyte %: 8.2 %
Neutrophil #: 6.6 10*3/uL — ABNORMAL HIGH (ref 1.4–6.5)
Neutrophil %: 80.2 %
RDW: 14 % (ref 11.5–14.5)

## 2012-09-15 LAB — PHOSPHORUS: Phosphorus: 3.9 mg/dL (ref 2.5–4.9)

## 2012-09-15 LAB — MAGNESIUM: Magnesium: 1.9 mg/dL

## 2012-09-29 LAB — BASIC METABOLIC PANEL
Anion Gap: 9 (ref 7–16)
Calcium, Total: 9.1 mg/dL (ref 8.5–10.1)
Co2: 29 mmol/L (ref 21–32)
EGFR (Non-African Amer.): 37 — ABNORMAL LOW
Glucose: 246 mg/dL — ABNORMAL HIGH (ref 65–99)
Osmolality: 288 (ref 275–301)
Potassium: 4 mmol/L (ref 3.5–5.1)
Sodium: 136 mmol/L (ref 136–145)

## 2012-09-29 LAB — CBC WITH DIFFERENTIAL/PLATELET
Basophil #: 0.1 10*3/uL (ref 0.0–0.1)
Basophil %: 0.6 %
Eosinophil #: 0.1 10*3/uL (ref 0.0–0.7)
Eosinophil %: 1.4 %
HCT: 40.5 % (ref 40.0–52.0)
HGB: 14.1 g/dL (ref 13.0–18.0)
Lymphocyte #: 0.7 10*3/uL — ABNORMAL LOW (ref 1.0–3.6)
MCV: 81 fL (ref 80–100)
Monocyte #: 0.7 x10 3/mm (ref 0.2–1.0)
Neutrophil %: 82.4 %
RBC: 5.04 10*6/uL (ref 4.40–5.90)

## 2012-09-29 LAB — PHOSPHORUS: Phosphorus: 3.3 mg/dL (ref 2.5–4.9)

## 2012-09-29 LAB — MAGNESIUM: Magnesium: 1.7 mg/dL — ABNORMAL LOW

## 2012-10-02 ENCOUNTER — Other Ambulatory Visit: Payer: Self-pay

## 2012-10-14 LAB — BASIC METABOLIC PANEL
Anion Gap: 8 (ref 7–16)
BUN: 40 mg/dL — ABNORMAL HIGH (ref 7–18)
Calcium, Total: 9.4 mg/dL (ref 8.5–10.1)
Chloride: 101 mmol/L (ref 98–107)
Co2: 29 mmol/L (ref 21–32)
Creatinine: 2.04 mg/dL — ABNORMAL HIGH (ref 0.60–1.30)
Osmolality: 288 (ref 275–301)
Potassium: 3.7 mmol/L (ref 3.5–5.1)
Sodium: 138 mmol/L (ref 136–145)

## 2012-10-14 LAB — CBC WITH DIFFERENTIAL/PLATELET
Basophil %: 0.5 %
Eosinophil #: 0.2 10*3/uL (ref 0.0–0.7)
Lymphocyte #: 0.7 10*3/uL — ABNORMAL LOW (ref 1.0–3.6)
Lymphocyte %: 10.4 %
MCHC: 34 g/dL (ref 32.0–36.0)
Monocyte #: 0.5 x10 3/mm (ref 0.2–1.0)
Monocyte %: 7.2 %
Neutrophil #: 5.7 10*3/uL (ref 1.4–6.5)
Neutrophil %: 79.6 %
Platelet: 271 10*3/uL (ref 150–440)
RBC: 5.05 10*6/uL (ref 4.40–5.90)
RDW: 13.8 % (ref 11.5–14.5)
WBC: 7.2 10*3/uL (ref 3.8–10.6)

## 2012-10-14 LAB — MAGNESIUM: Magnesium: 1.9 mg/dL

## 2012-10-27 LAB — MAGNESIUM: Magnesium: 1.8 mg/dL

## 2012-10-27 LAB — CBC WITH DIFFERENTIAL/PLATELET
Basophil #: 0 10*3/uL (ref 0.0–0.1)
Basophil %: 0.4 %
Eosinophil %: 1.8 %
HCT: 41.3 % (ref 40.0–52.0)
HGB: 14.3 g/dL (ref 13.0–18.0)
Lymphocyte #: 0.9 10*3/uL — ABNORMAL LOW (ref 1.0–3.6)
Lymphocyte %: 10.3 %
MCH: 27.9 pg (ref 26.0–34.0)
MCHC: 34.5 g/dL (ref 32.0–36.0)
MCV: 81 fL (ref 80–100)
Monocyte %: 7.8 %
Neutrophil #: 6.9 10*3/uL — ABNORMAL HIGH (ref 1.4–6.5)
Neutrophil %: 79.7 %
Platelet: 269 10*3/uL (ref 150–440)
RBC: 5.12 10*6/uL (ref 4.40–5.90)
RDW: 14.3 % (ref 11.5–14.5)
WBC: 8.6 10*3/uL (ref 3.8–10.6)

## 2012-10-27 LAB — HEMOGLOBIN A1C: Hemoglobin A1C: 6.8 % — ABNORMAL HIGH (ref 4.2–6.3)

## 2012-10-27 LAB — BASIC METABOLIC PANEL
Anion Gap: 8 (ref 7–16)
Calcium, Total: 9.1 mg/dL (ref 8.5–10.1)
Chloride: 100 mmol/L (ref 98–107)
Co2: 29 mmol/L (ref 21–32)
Creatinine: 1.98 mg/dL — ABNORMAL HIGH (ref 0.60–1.30)
EGFR (African American): 40 — ABNORMAL LOW
Osmolality: 290 (ref 275–301)
Potassium: 3.8 mmol/L (ref 3.5–5.1)
Sodium: 137 mmol/L (ref 136–145)

## 2012-10-27 LAB — PHOSPHORUS: Phosphorus: 3.9 mg/dL (ref 2.5–4.9)

## 2012-11-02 ENCOUNTER — Other Ambulatory Visit: Payer: Self-pay

## 2012-11-10 LAB — BASIC METABOLIC PANEL
Anion Gap: 6 — ABNORMAL LOW (ref 7–16)
Calcium, Total: 9.2 mg/dL (ref 8.5–10.1)
Chloride: 102 mmol/L (ref 98–107)
Co2: 29 mmol/L (ref 21–32)
EGFR (African American): 39 — ABNORMAL LOW
EGFR (Non-African Amer.): 34 — ABNORMAL LOW
Glucose: 168 mg/dL — ABNORMAL HIGH (ref 65–99)
Osmolality: 287 (ref 275–301)
Sodium: 137 mmol/L (ref 136–145)

## 2012-11-10 LAB — CBC WITH DIFFERENTIAL/PLATELET
Basophil %: 0.3 %
HCT: 40.6 % (ref 40.0–52.0)
MCH: 27.9 pg (ref 26.0–34.0)
MCHC: 34.4 g/dL (ref 32.0–36.0)
Neutrophil #: 7.1 10*3/uL — ABNORMAL HIGH (ref 1.4–6.5)
Neutrophil %: 82.5 %
Platelet: 268 10*3/uL (ref 150–440)
RBC: 5.01 10*6/uL (ref 4.40–5.90)
RDW: 14.6 % — ABNORMAL HIGH (ref 11.5–14.5)
WBC: 8.6 10*3/uL (ref 3.8–10.6)

## 2012-11-10 LAB — LIPID PANEL
Cholesterol: 140 mg/dL (ref 0–200)
HDL Cholesterol: 31 mg/dL — ABNORMAL LOW (ref 40–60)
Ldl Cholesterol, Calc: 76 mg/dL (ref 0–100)
Triglycerides: 164 mg/dL (ref 0–200)
VLDL Cholesterol, Calc: 33 mg/dL (ref 5–40)

## 2012-11-10 LAB — HEPATIC FUNCTION PANEL A (ARMC)
Bilirubin,Total: 0.4 mg/dL (ref 0.2–1.0)
Total Protein: 7.2 g/dL (ref 6.4–8.2)

## 2012-11-10 LAB — HEMOGLOBIN A1C: Hemoglobin A1C: 6.9 % — ABNORMAL HIGH (ref 4.2–6.3)

## 2012-11-10 LAB — GAMMA GT: GGT: 22 U/L (ref 5–85)

## 2012-11-10 LAB — MAGNESIUM: Magnesium: 1.7 mg/dL — ABNORMAL LOW

## 2012-11-25 LAB — CBC WITH DIFFERENTIAL/PLATELET
Eosinophil #: 0.1 10*3/uL (ref 0.0–0.7)
Eosinophil %: 1.9 %
HCT: 40.2 % (ref 40.0–52.0)
HGB: 13.8 g/dL (ref 13.0–18.0)
Lymphocyte #: 0.7 10*3/uL — ABNORMAL LOW (ref 1.0–3.6)
Lymphocyte %: 9.1 %
MCH: 27.8 pg (ref 26.0–34.0)
MCHC: 34.4 g/dL (ref 32.0–36.0)
MCV: 81 fL (ref 80–100)
Neutrophil #: 6.2 10*3/uL (ref 1.4–6.5)
Platelet: 278 10*3/uL (ref 150–440)
RDW: 14.5 % (ref 11.5–14.5)
WBC: 7.7 10*3/uL (ref 3.8–10.6)

## 2012-11-25 LAB — BASIC METABOLIC PANEL
Anion Gap: 6 — ABNORMAL LOW (ref 7–16)
Calcium, Total: 8.8 mg/dL (ref 8.5–10.1)
Co2: 30 mmol/L (ref 21–32)
Creatinine: 1.91 mg/dL — ABNORMAL HIGH (ref 0.60–1.30)
EGFR (African American): 42 — ABNORMAL LOW
EGFR (Non-African Amer.): 36 — ABNORMAL LOW
Sodium: 138 mmol/L (ref 136–145)

## 2012-11-25 LAB — MAGNESIUM: Magnesium: 1.9 mg/dL

## 2012-11-25 LAB — PHOSPHORUS: Phosphorus: 3.5 mg/dL (ref 2.5–4.9)

## 2012-12-02 ENCOUNTER — Other Ambulatory Visit: Payer: Self-pay

## 2012-12-23 LAB — BASIC METABOLIC PANEL
Calcium, Total: 8.9 mg/dL (ref 8.5–10.1)
Chloride: 99 mmol/L (ref 98–107)
Co2: 32 mmol/L (ref 21–32)
Creatinine: 2.02 mg/dL — ABNORMAL HIGH (ref 0.60–1.30)
EGFR (African American): 39 — ABNORMAL LOW
EGFR (Non-African Amer.): 34 — ABNORMAL LOW
Potassium: 3.6 mmol/L (ref 3.5–5.1)

## 2012-12-23 LAB — CBC WITH DIFFERENTIAL/PLATELET
Basophil #: 0 10*3/uL (ref 0.0–0.1)
Basophil %: 0.3 %
Eosinophil #: 0.2 10*3/uL (ref 0.0–0.7)
Eosinophil %: 2.6 %
HCT: 40.6 % (ref 40.0–52.0)
HGB: 13.8 g/dL (ref 13.0–18.0)
Lymphocyte #: 1 10*3/uL (ref 1.0–3.6)
Lymphocyte %: 11.4 %
MCH: 27.4 pg (ref 26.0–34.0)
MCHC: 33.9 g/dL (ref 32.0–36.0)
MCV: 81 fL (ref 80–100)
Monocyte #: 0.7 x10 3/mm (ref 0.2–1.0)
Monocyte %: 8.1 %
Neutrophil %: 77.6 %
Platelet: 259 10*3/uL (ref 150–440)
RDW: 14.5 % (ref 11.5–14.5)

## 2012-12-23 LAB — MAGNESIUM: Magnesium: 1.8 mg/dL

## 2013-01-02 ENCOUNTER — Other Ambulatory Visit: Payer: Self-pay

## 2013-01-19 LAB — CBC WITH DIFFERENTIAL/PLATELET
Eosinophil %: 1.8 %
HCT: 42.3 % (ref 40.0–52.0)
HGB: 15 g/dL (ref 13.0–18.0)
MCH: 28.4 pg (ref 26.0–34.0)
MCHC: 35.4 g/dL (ref 32.0–36.0)
MCV: 80 fL (ref 80–100)
Monocyte #: 0.7 x10 3/mm (ref 0.2–1.0)
Neutrophil #: 6.2 10*3/uL (ref 1.4–6.5)
Platelet: 275 10*3/uL (ref 150–440)
RBC: 5.28 10*6/uL (ref 4.40–5.90)

## 2013-01-19 LAB — BASIC METABOLIC PANEL
BUN: 35 mg/dL — ABNORMAL HIGH (ref 7–18)
Calcium, Total: 9.3 mg/dL (ref 8.5–10.1)
Co2: 29 mmol/L (ref 21–32)
Creatinine: 1.87 mg/dL — ABNORMAL HIGH (ref 0.60–1.30)
EGFR (Non-African Amer.): 37 — ABNORMAL LOW
Osmolality: 282 (ref 275–301)
Potassium: 3.8 mmol/L (ref 3.5–5.1)
Sodium: 133 mmol/L — ABNORMAL LOW (ref 136–145)

## 2013-01-19 LAB — PHOSPHORUS: Phosphorus: 3.7 mg/dL (ref 2.5–4.9)

## 2013-02-02 ENCOUNTER — Other Ambulatory Visit: Payer: Self-pay

## 2013-02-16 LAB — CBC WITH DIFFERENTIAL/PLATELET
Basophil #: 0 10*3/uL (ref 0.0–0.1)
Basophil %: 0.4 %
Eosinophil #: 0.1 10*3/uL (ref 0.0–0.7)
Eosinophil %: 1.3 %
HCT: 43.4 % (ref 40.0–52.0)
HGB: 14.9 g/dL (ref 13.0–18.0)
Lymphocyte #: 0.8 10*3/uL — ABNORMAL LOW (ref 1.0–3.6)
Lymphocyte %: 11.4 %
MCH: 27.9 pg (ref 26.0–34.0)
MCHC: 34.3 g/dL (ref 32.0–36.0)
MCV: 81 fL (ref 80–100)
Neutrophil #: 5.7 10*3/uL (ref 1.4–6.5)
RBC: 5.34 10*6/uL (ref 4.40–5.90)
RDW: 14.9 % — ABNORMAL HIGH (ref 11.5–14.5)
WBC: 7.2 10*3/uL (ref 3.8–10.6)

## 2013-02-16 LAB — LIPID PANEL
HDL Cholesterol: 35 mg/dL — ABNORMAL LOW (ref 40–60)
Ldl Cholesterol, Calc: 86 mg/dL (ref 0–100)

## 2013-02-16 LAB — BASIC METABOLIC PANEL
BUN: 32 mg/dL — ABNORMAL HIGH (ref 7–18)
Chloride: 97 mmol/L — ABNORMAL LOW (ref 98–107)
Co2: 31 mmol/L (ref 21–32)
Creatinine: 1.84 mg/dL — ABNORMAL HIGH (ref 0.60–1.30)
EGFR (Non-African Amer.): 38 — ABNORMAL LOW
Osmolality: 285 (ref 275–301)
Potassium: 4.1 mmol/L (ref 3.5–5.1)

## 2013-02-16 LAB — GAMMA GT: GGT: 21 U/L (ref 5–85)

## 2013-02-16 LAB — HEPATIC FUNCTION PANEL A (ARMC)
Albumin: 3.7 g/dL (ref 3.4–5.0)
Alkaline Phosphatase: 98 U/L (ref 50–136)
SGOT(AST): 23 U/L (ref 15–37)

## 2013-02-16 LAB — HEMOGLOBIN A1C: Hemoglobin A1C: 7.5 % — ABNORMAL HIGH (ref 4.2–6.3)

## 2013-02-16 LAB — PHOSPHORUS: Phosphorus: 3.2 mg/dL (ref 2.5–4.9)

## 2013-03-04 ENCOUNTER — Other Ambulatory Visit: Payer: Self-pay

## 2013-03-16 LAB — CBC WITH DIFFERENTIAL/PLATELET
Basophil #: 0 10*3/uL (ref 0.0–0.1)
Basophil %: 0.6 %
HCT: 41.6 % (ref 40.0–52.0)
HGB: 14.3 g/dL (ref 13.0–18.0)
Lymphocyte %: 9.6 %
MCH: 28.1 pg (ref 26.0–34.0)
MCV: 82 fL (ref 80–100)
Neutrophil #: 5.2 10*3/uL (ref 1.4–6.5)
RBC: 5.07 10*6/uL (ref 4.40–5.90)
RDW: 14.3 % (ref 11.5–14.5)

## 2013-03-16 LAB — BASIC METABOLIC PANEL
Anion Gap: 7 (ref 7–16)
BUN: 28 mg/dL — ABNORMAL HIGH (ref 7–18)
Calcium, Total: 9.4 mg/dL (ref 8.5–10.1)
Co2: 30 mmol/L (ref 21–32)
Creatinine: 1.88 mg/dL — ABNORMAL HIGH (ref 0.60–1.30)
Osmolality: 286 (ref 275–301)
Potassium: 4.3 mmol/L (ref 3.5–5.1)
Sodium: 137 mmol/L (ref 136–145)

## 2013-03-16 LAB — MAGNESIUM: Magnesium: 1.8 mg/dL

## 2013-04-04 ENCOUNTER — Other Ambulatory Visit: Payer: Self-pay

## 2013-04-04 IMAGING — CR DG CHEST 2V
1 series · 2 of 2 positions shown · non-contrast
Comparison: none

REASON FOR EXAM: cough, wheezing and congestion
COMMENTS:

PROCEDURE:     DXR - DXR CHEST PA (OR AP) AND LATERAL  - June 12, 2011  [DATE]
RESULT:     The lung fields are clear. No pneumonia, pneumothorax or pleural
effusion is seen. The heart is mildly enlarged. No acute bony abnormalities
are seen.

[Series 1: pa · 0.17mm/px · 2 of 2 slices shown]
[im 1/2]
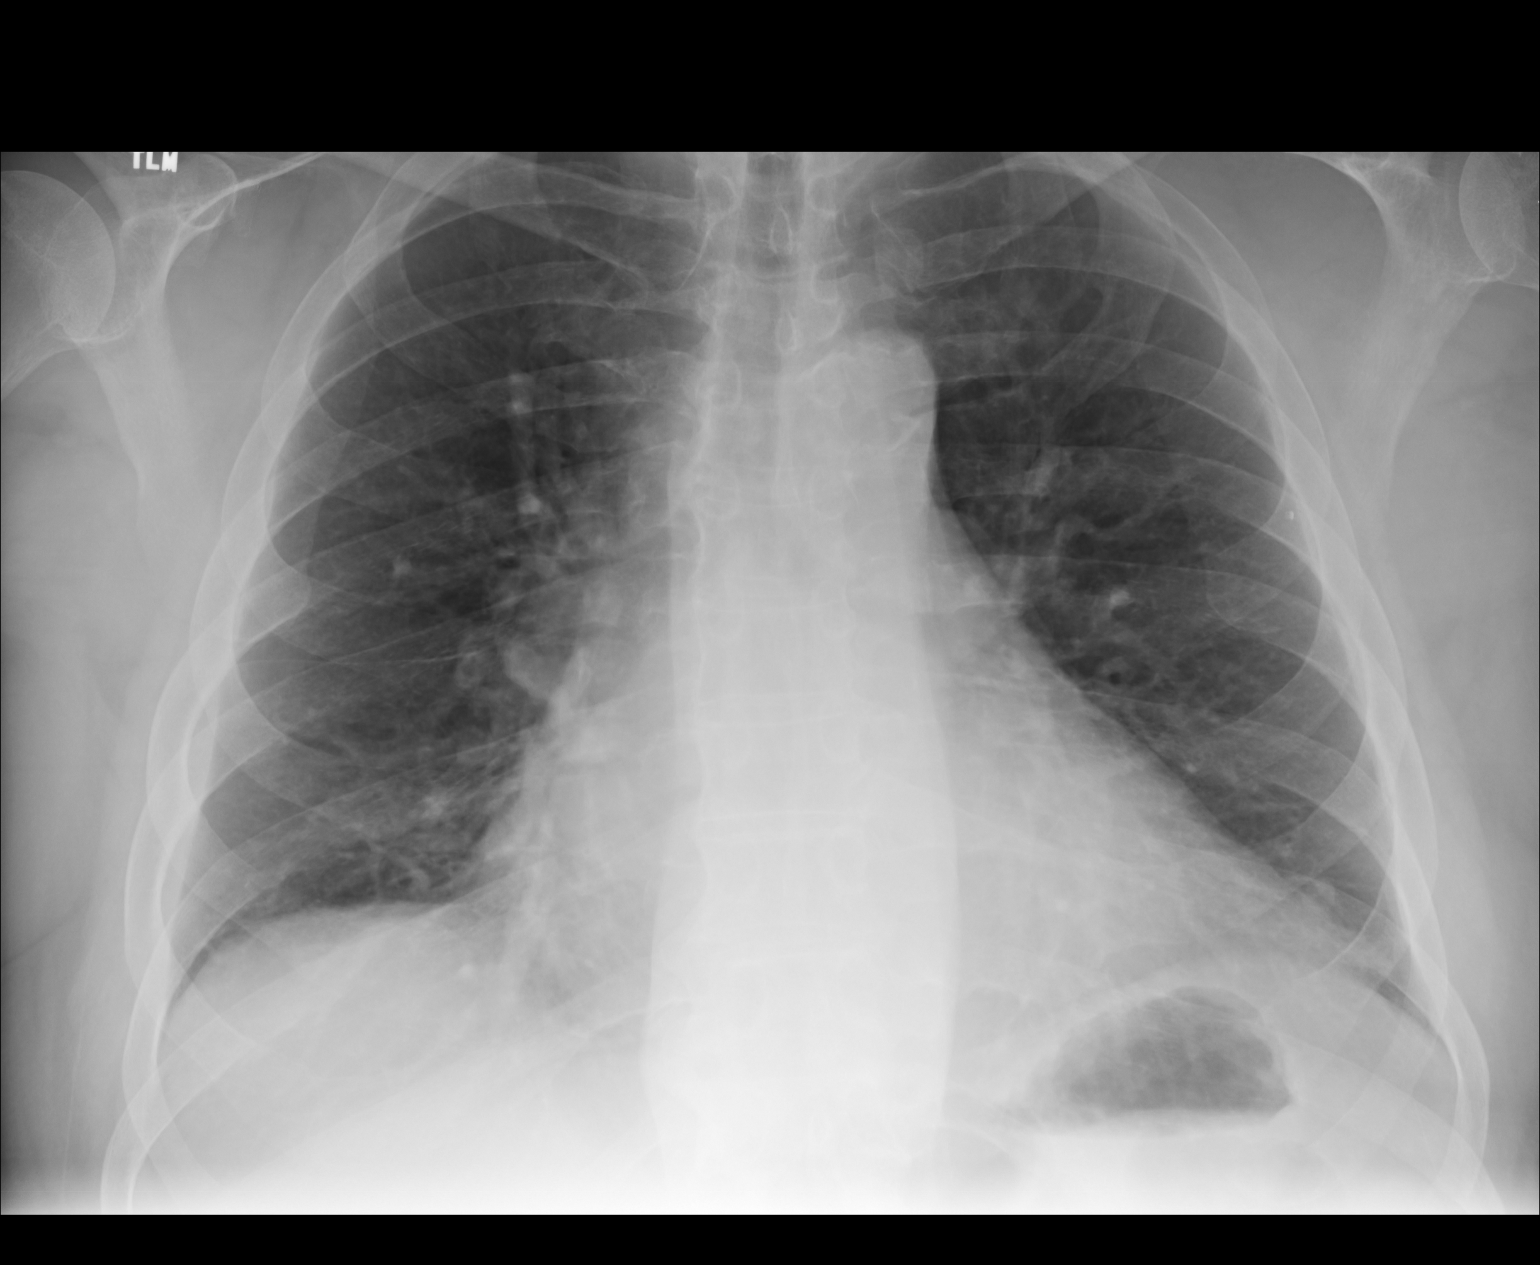
[im 2/2]
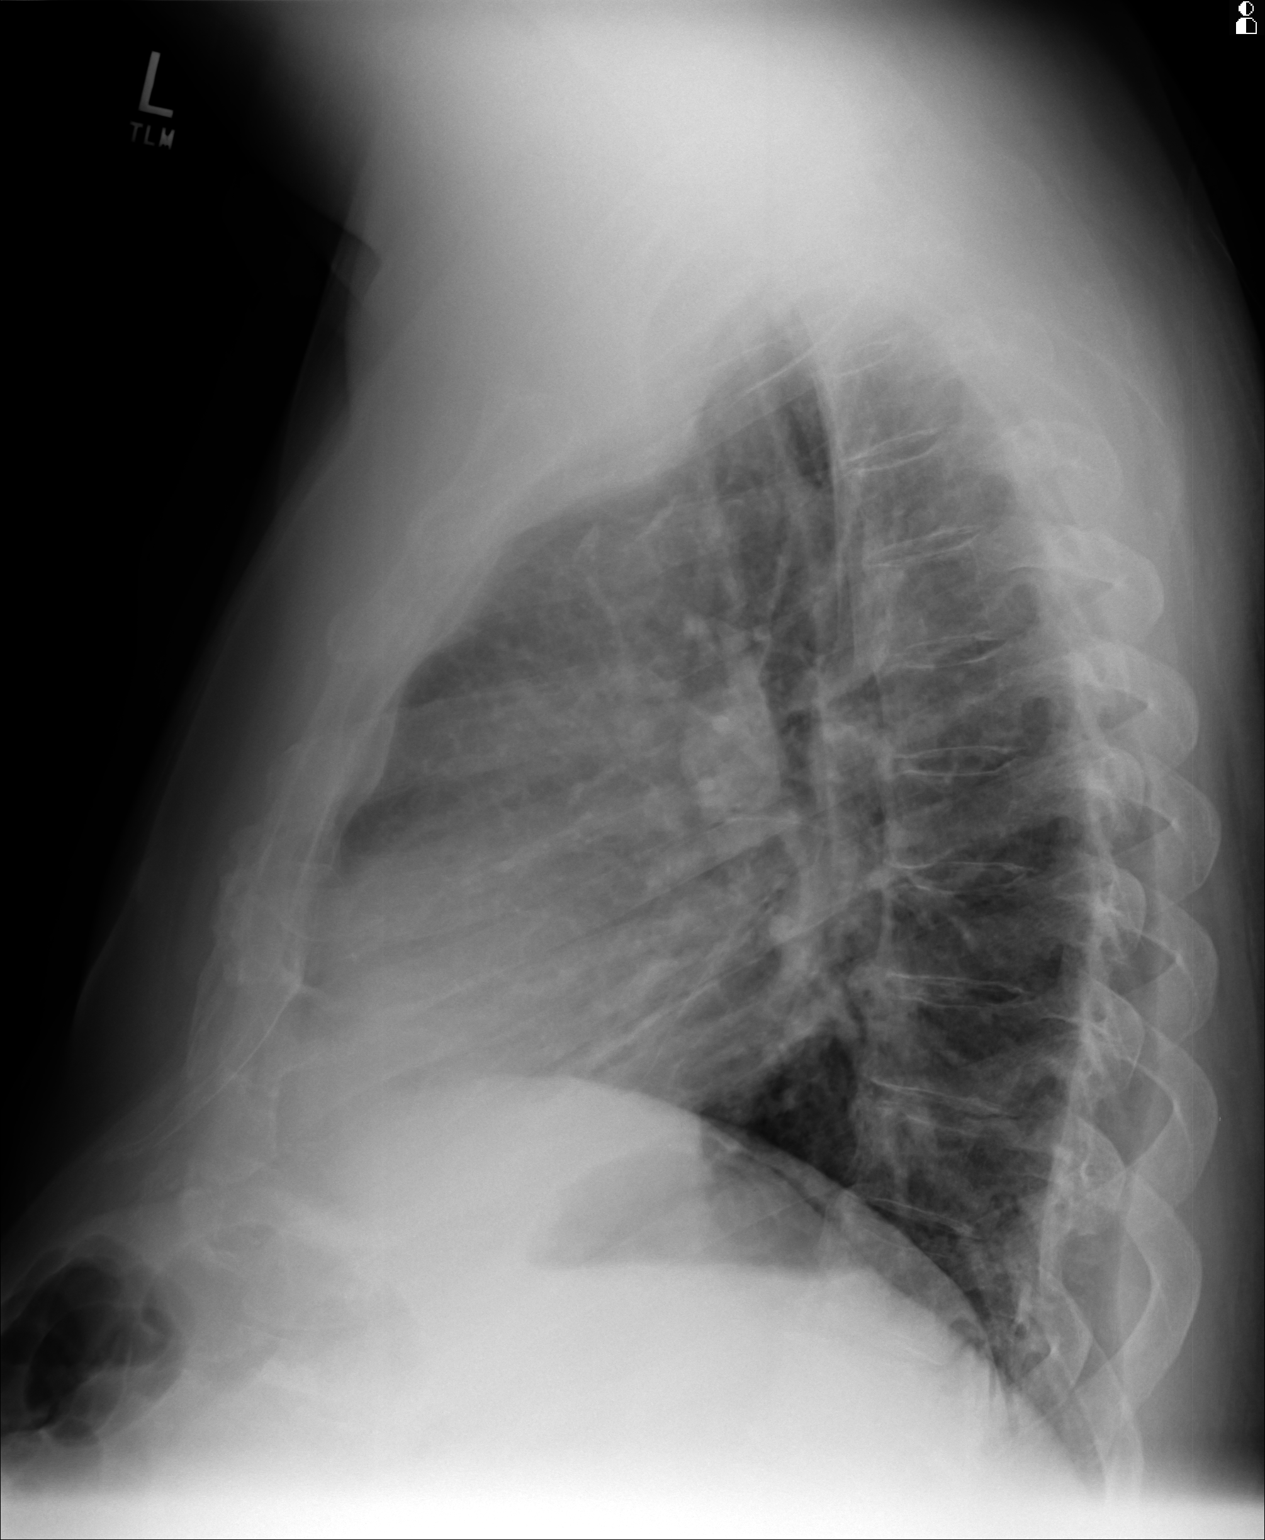

[2 of 2 positions shown; findings below may reference images not displayed]

IMPRESSION: 1. The lung fields are clear.
2. The heart is mildly enlarged.

## 2013-04-13 LAB — BASIC METABOLIC PANEL
Anion Gap: 4 — ABNORMAL LOW (ref 7–16)
Calcium, Total: 9.6 mg/dL (ref 8.5–10.1)
Chloride: 100 mmol/L (ref 98–107)
Creatinine: 1.7 mg/dL — ABNORMAL HIGH (ref 0.60–1.30)
Osmolality: 287 (ref 275–301)
Potassium: 3.8 mmol/L (ref 3.5–5.1)
Sodium: 135 mmol/L — ABNORMAL LOW (ref 136–145)

## 2013-04-13 LAB — CBC WITH DIFFERENTIAL/PLATELET
Basophil #: 0 10*3/uL (ref 0.0–0.1)
Basophil %: 0.8 %
HCT: 41.1 % (ref 40.0–52.0)
Lymphocyte %: 11.1 %
MCHC: 34.9 g/dL (ref 32.0–36.0)
MCV: 81 fL (ref 80–100)
Monocyte #: 0.5 x10 3/mm (ref 0.2–1.0)
Monocyte %: 9.3 %
Platelet: 215 10*3/uL (ref 150–440)
RBC: 5.08 10*6/uL (ref 4.40–5.90)

## 2013-04-13 LAB — PHOSPHORUS: Phosphorus: 3.3 mg/dL (ref 2.5–4.9)

## 2013-04-13 LAB — MAGNESIUM: Magnesium: 1.4 mg/dL — ABNORMAL LOW

## 2013-05-11 ENCOUNTER — Other Ambulatory Visit: Payer: Self-pay

## 2013-05-11 LAB — CBC WITH DIFFERENTIAL/PLATELET
Basophil #: 0 10*3/uL (ref 0.0–0.1)
Basophil %: 0.7 %
Eosinophil #: 0 10*3/uL (ref 0.0–0.7)
Eosinophil %: 0.9 %
HCT: 42.5 % (ref 40.0–52.0)
HGB: 14.7 g/dL (ref 13.0–18.0)
Lymphocyte %: 9.6 %
MCH: 28 pg (ref 26.0–34.0)
Monocyte %: 10.7 %
Platelet: 303 10*3/uL (ref 150–440)

## 2013-05-11 LAB — BASIC METABOLIC PANEL
Anion Gap: 8 (ref 7–16)
BUN: 26 mg/dL — ABNORMAL HIGH (ref 7–18)
Calcium, Total: 9.4 mg/dL (ref 8.5–10.1)
Chloride: 99 mmol/L (ref 98–107)
EGFR (African American): 46 — ABNORMAL LOW
EGFR (Non-African Amer.): 40 — ABNORMAL LOW
Glucose: 234 mg/dL — ABNORMAL HIGH (ref 65–99)
Osmolality: 284 (ref 275–301)
Sodium: 136 mmol/L (ref 136–145)

## 2013-05-11 LAB — HEPATIC FUNCTION PANEL A (ARMC)
Albumin: 3.8 g/dL (ref 3.4–5.0)
Alkaline Phosphatase: 89 U/L
Bilirubin,Total: 0.7 mg/dL (ref 0.2–1.0)
SGOT(AST): 37 U/L (ref 15–37)
SGPT (ALT): 24 U/L (ref 12–78)

## 2013-05-11 LAB — LIPID PANEL
Cholesterol: 166 mg/dL (ref 0–200)
Ldl Cholesterol, Calc: 91 mg/dL (ref 0–100)
Triglycerides: 193 mg/dL (ref 0–200)
VLDL Cholesterol, Calc: 39 mg/dL (ref 5–40)

## 2013-05-11 LAB — MAGNESIUM: Magnesium: 1.7 mg/dL — ABNORMAL LOW

## 2013-05-22 ENCOUNTER — Emergency Department: Payer: Self-pay | Admitting: Emergency Medicine

## 2013-05-22 LAB — BASIC METABOLIC PANEL
Anion Gap: 9 (ref 7–16)
BUN: 41 mg/dL — ABNORMAL HIGH (ref 7–18)
Chloride: 101 mmol/L (ref 98–107)
Co2: 28 mmol/L (ref 21–32)
EGFR (Non-African Amer.): 28 — ABNORMAL LOW
Potassium: 3.7 mmol/L (ref 3.5–5.1)
Sodium: 138 mmol/L (ref 136–145)

## 2013-05-22 LAB — CBC WITH DIFFERENTIAL/PLATELET
Basophil #: 0 10*3/uL (ref 0.0–0.1)
Basophil %: 0.5 %
Eosinophil #: 0 10*3/uL (ref 0.0–0.7)
HCT: 41.1 % (ref 40.0–52.0)
HGB: 13.6 g/dL (ref 13.0–18.0)
Lymphocyte %: 12 %
MCHC: 33.1 g/dL (ref 32.0–36.0)
Monocyte #: 0.6 x10 3/mm (ref 0.2–1.0)
Monocyte %: 8.2 %
RDW: 14 % (ref 11.5–14.5)
WBC: 6.8 10*3/uL (ref 3.8–10.6)

## 2013-05-22 LAB — PROTIME-INR: Prothrombin Time: 13.9 secs (ref 11.5–14.7)

## 2013-05-22 LAB — APTT: Activated PTT: 30.3 secs (ref 23.6–35.9)

## 2013-05-23 ENCOUNTER — Ambulatory Visit (HOSPITAL_COMMUNITY)
Admission: AD | Admit: 2013-05-23 | Discharge: 2013-05-23 | Disposition: A | Discharge status: Home / Self Care | Payer: Medicare Other | Source: Other Acute Inpatient Hospital | Attending: Emergency Medicine | Admitting: Emergency Medicine

## 2013-05-23 DIAGNOSIS — R55 Syncope and collapse: Secondary | ICD-10-CM | POA: Insufficient documentation

## 2013-05-23 DIAGNOSIS — I2699 Other pulmonary embolism without acute cor pulmonale: Secondary | ICD-10-CM | POA: Insufficient documentation

## 2013-05-23 DIAGNOSIS — R0902 Hypoxemia: Secondary | ICD-10-CM | POA: Insufficient documentation

## 2013-05-26 MED FILL — Ondansetron HCl Inj 4 MG/2ML (2 MG/ML): INTRAMUSCULAR | Qty: 2 | Status: AC

## 2013-06-04 ENCOUNTER — Other Ambulatory Visit: Payer: Self-pay

## 2013-06-08 LAB — CBC WITH DIFFERENTIAL/PLATELET
Basophil #: 0.1 10*3/uL (ref 0.0–0.1)
Basophil %: 1 %
Eosinophil #: 0 10*3/uL (ref 0.0–0.7)
Eosinophil %: 0.6 %
HCT: 42.4 % (ref 40.0–52.0)
HGB: 14 g/dL (ref 13.0–18.0)
Lymphocyte #: 0.5 10*3/uL — ABNORMAL LOW (ref 1.0–3.6)
Lymphocyte %: 9.1 %
MCH: 26.7 pg (ref 26.0–34.0)
MCHC: 33.1 g/dL (ref 32.0–36.0)
MCV: 81 fL (ref 80–100)
MONO ABS: 0.5 x10 3/mm (ref 0.2–1.0)
Monocyte %: 10.5 %
NEUTROS ABS: 4 10*3/uL (ref 1.4–6.5)
Neutrophil %: 78.8 %
PLATELETS: 376 10*3/uL (ref 150–440)
RBC: 5.25 10*6/uL (ref 4.40–5.90)
RDW: 14.5 % (ref 11.5–14.5)
WBC: 5.1 10*3/uL (ref 3.8–10.6)

## 2013-06-08 LAB — MAGNESIUM: Magnesium: 1.9 mg/dL

## 2013-06-08 LAB — BASIC METABOLIC PANEL
ANION GAP: 9 (ref 7–16)
BUN: 34 mg/dL — AB (ref 7–18)
CHLORIDE: 103 mmol/L (ref 98–107)
CREATININE: 1.9 mg/dL — AB (ref 0.60–1.30)
Calcium, Total: 9.1 mg/dL (ref 8.5–10.1)
Co2: 22 mmol/L (ref 21–32)
EGFR (African American): 42 — ABNORMAL LOW
EGFR (Non-African Amer.): 36 — ABNORMAL LOW
Glucose: 219 mg/dL — ABNORMAL HIGH (ref 65–99)
Osmolality: 283 (ref 275–301)
Potassium: 5.2 mmol/L — ABNORMAL HIGH (ref 3.5–5.1)
SODIUM: 134 mmol/L — AB (ref 136–145)

## 2013-06-08 LAB — PROTIME-INR
INR: 2.3
PROTHROMBIN TIME: 24.8 s — AB (ref 11.5–14.7)

## 2013-06-08 LAB — PHOSPHORUS: PHOSPHORUS: 4 mg/dL (ref 2.5–4.9)

## 2013-06-11 LAB — PROTIME-INR
INR: 3.1
Prothrombin Time: 31.3 secs — ABNORMAL HIGH (ref 11.5–14.7)

## 2013-06-15 LAB — PROTIME-INR
INR: 3.5
PROTHROMBIN TIME: 33.7 s — AB (ref 11.5–14.7)

## 2013-06-18 LAB — PROTIME-INR
INR: 3.3
PROTHROMBIN TIME: 32.6 s — AB (ref 11.5–14.7)

## 2013-06-22 LAB — PROTIME-INR
INR: 3
Prothrombin Time: 30.5 secs — ABNORMAL HIGH (ref 11.5–14.7)

## 2013-06-29 LAB — PROTIME-INR
INR: 2.8
Prothrombin Time: 28.7 secs — ABNORMAL HIGH (ref 11.5–14.7)

## 2013-07-05 ENCOUNTER — Other Ambulatory Visit: Payer: Self-pay

## 2013-10-27 DIAGNOSIS — C4492 Squamous cell carcinoma of skin, unspecified: Secondary | ICD-10-CM

## 2013-10-27 HISTORY — DX: Squamous cell carcinoma of skin, unspecified: C44.92

## 2014-08-09 ENCOUNTER — Other Ambulatory Visit: Payer: Self-pay | Admitting: Internal Medicine

## 2014-10-08 ENCOUNTER — Encounter: Payer: Self-pay | Admitting: Gastroenterology

## 2015-05-16 DIAGNOSIS — C4491 Basal cell carcinoma of skin, unspecified: Secondary | ICD-10-CM

## 2015-05-16 HISTORY — DX: Basal cell carcinoma of skin, unspecified: C44.91

## 2015-12-12 NOTE — Unmapped (Signed)
Erroneous encounter

## 2016-01-25 ENCOUNTER — Encounter: Payer: Self-pay | Admitting: Gastroenterology

## 2016-03-16 ENCOUNTER — Encounter: Payer: Self-pay | Admitting: Gastroenterology

## 2016-06-07 ENCOUNTER — Ambulatory Visit (INDEPENDENT_AMBULATORY_CARE_PROVIDER_SITE_OTHER): Payer: Medicare Other | Admitting: Gastroenterology

## 2016-06-07 ENCOUNTER — Encounter: Payer: Self-pay | Admitting: Gastroenterology

## 2016-06-07 VITALS — BP 100/64 | HR 64 | Ht 72.0 in | Wt 269.2 lb

## 2016-06-07 DIAGNOSIS — Z86010 Personal history of colonic polyps: Secondary | ICD-10-CM

## 2016-06-07 DIAGNOSIS — Z8673 Personal history of transient ischemic attack (TIA), and cerebral infarction without residual deficits: Secondary | ICD-10-CM

## 2016-06-07 DIAGNOSIS — Z86711 Personal history of pulmonary embolism: Secondary | ICD-10-CM

## 2016-06-07 DIAGNOSIS — I4891 Unspecified atrial fibrillation: Secondary | ICD-10-CM

## 2016-06-07 DIAGNOSIS — Z940 Kidney transplant status: Secondary | ICD-10-CM

## 2016-06-07 NOTE — Progress Notes (Signed)
[facility] Consult Note:  History: Larry Bright 06/07/2016  Referring physician: Pcp Not In System  Reason for consult/chief complaint: colon recall History of adenomatous colon polyps  Subjective  HPI:  Larry Bright was brought for an office visit to discuss: Polyp surveillance in light of his multiple medical problems. He had a colonoscopy with adenomatous polyps in 2004, and again in 2007, and 2012 year 3 mm tubular adenoma. There was no dysplasia on any of them. Of note, his wife reports that his glucose became "very low" the day prior to his last colonoscopy. They were told to use half dose of his typical insulin, but he was not told to consume glucose containing liquids. His wife reports that he was "practically comatose" when she got home from work that day. He is a, located medical history, most of which could be cleaned from review of his chart. Unfortunately, it is made more challenging since virtually all of his care is in the [facility] system. I reviewed his most recent cardiologist note from 03/09/2016. 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 Even his wife report that he was on Coumadin after that, but it was stopped by hematologist when it was felt it was no longer necessary. He then suffered a "light stroke" sometime after that, and seems to have had paroxysmal atrial fibrillation. His cardiologist note indicates an EKG earlier this year in sinus rhythm, and he also had a regular rhythm on exam in the October visit. Echocardiogram March 2017 shows normal ejection fraction. [name] had a renal transplant about 5 years ago and has chronic kidney disease after that. Last known creatinine in our system was 1.9 in January 2015. He has had applications of toes in the past due to poorly controlled diabetes. He is on U 500 insulin for this.  He denies chronic abdominal pain and change in bowel habits or rectal bleeding. He denies  dysphagia, odynophagia, nausea vomiting or weight loss.  ROS:  Review of Systems  He denies chest pain or dysuria, he has chronic stable dyspnea with exertion. Past Medical History: Past Medical History:  Diagnosis Date  . Arthritis   . Chronic kidney disease (CKD), stage IV (severe) ([place])   . Diabetes mellitus   . Hypertension   . Kidney transplant recipient   . Neuropathy of foot   . Sleep apnea    CPAP  . Stroke (cerebrum) ([place])    A fibrillation A flutter ablation 2013  Past Surgical History: Past Surgical History:  Procedure Laterality Date  . CATARACT EXTRACTION  2009  . KIDNEY TRANSPLANT    . KNEE ARTHROSCOPY  2008   left  . TOE SURGERY     X 2  . [place] for CVA 2014  Family History: Family History  Problem Relation Age of Onset  . Diabetes Mother   . Kidney disease Father     was on dialysis  . Skin cancer Father   . Alcoholism Brother   . Colon cancer Neg Hx   . Stomach cancer Neg Hx   . Rectal cancer Neg Hx   . Esophageal cancer Neg Hx   . Liver cancer Neg Hx     Social History: Social History   Social History  . Marital status: Married    Spouse name: N/A  . Number of children: 2  . Years of education: N/A   Occupational History  . Retired    Social History  Main Topics  . Smoking status: Never Smoker  . Smokeless tobacco: Never Used  . Alcohol use No  . Drug use: No  . Sexual activity: Not Asked   Other Topics Concern  . None   Social History Narrative  . None    Allergies: Allergies  Allergen Reactions  . Eggs Or Egg-Derived Products     rash  . Enalapril     cough  . Epinephrine     Heart palpitations  . Mepivacaine Hcl     unspecified  . Penicillins     As child    Outpatient Meds: Current Outpatient Prescriptions  Medication Sig Dispense Refill  . aspirin 81 MG tablet Take 81 mg by mouth as needed.     . chlorthalidone (HYGROTON) 25 MG tablet daily at 2 PM.  10  . Cholecalciferol (VITAMIN  D-1000 MAX ST) 1000 units tablet Take 2,000 Int'l Units by mouth daily at 2 PM.    . furosemide (LASIX) 40 MG tablet Take 40 mg by mouth daily at 2 PM.     . gabapentin (NEURONTIN) 300 MG capsule 2 (two) times daily.  2  . HUMULIN R U-500 KWIKPEN 500 UNIT/ML kwikpen Sliding scale  10  . magnesium chloride (SLOW-MAG) 64 MG TBEC SR tablet Take 2 tablets by mouth 3 (three) times daily.    . metoprolol (TOPROL-XL) 200 MG 24 hr tablet 2 (two) times daily.  2  . Multiple Vitamin (MULTIVITAMIN) tablet Take 1 tablet by mouth daily.    . mycophenolate (MYFORTIC) 180 MG EC tablet Take 180 mg by mouth as directed. Three   Twice daily    . ONE TOUCH ULTRA TEST test strip     . [place] LANCETS MISC     . pravastatin (PRAVACHOL) 20 MG tablet Take 20 mg by mouth daily at 2 PM.    . tacrolimus (PROGRAF) 1 MG capsule Take 3 mg by mouth. Pt taking 3mg  b.i d  DOSE VARIES    . valsartan (DIOVAN) 80 MG tablet daily at 2 PM.  2  . warfarin (COUMADIN) 5 MG tablet Alternating with 2.5 and 5mg  qod     No current facility-administered medications for this visit.       ___________________________________________________________________ Objective   Exam:  BP 100/64   Pulse 64   Ht 6' (1.829 m)   Wt 269 lb 4 oz (122.1 kg)   BMI 36.52 kg/m    General: this is a(n) Obese, chronically ill-appearing man, alert conversational and appropriate   Eyes: sclera anicteric, no redness  ENT: oral mucosa moist without lesions, no cervical or supraclavicular lymphadenopathy, good dentition  CV: RRR without murmur, S1/S2, no JVD, bilateral leg edema, left greater than right she says is due to previous knee surgery  Resp: clear to auscultation bilaterally, normal RR and effort noted  GI: soft, no tenderness, with active bowel sounds. No guarding or palpable organomegaly noted.  Skin; warm and dry, no rash or jaundice noted. He has brawny erythema of the lower left leg  Neuro: awake, alert and oriented x 3.  Normal gross motor function and fluent speech  Labs:  Recent CMP noted   Assessment: Encounter Diagnoses  Name Primary?  . History of colonic polyps Yes  . Atrial fibrillation, unspecified type ([place])   . History of stroke   . History of pulmonary embolism   . History of renal transplant     He is definitely at increased risk for colonoscopy. I advised him  to have a colon guard test, though it has not been studied for polyp surveillance. Nevertheless, it has utility in this situation because if negative, we can be reassured and not pursue a colonoscopy. If positive, he would be willing to undergo the procedure, at which time we would contact his cardiologist regarding recommendations on his Coumadin and possible need for Lovenox bridging.  Plan:  Colo-guard  Total 30 minute time, over half spent reviewing his extensive records counseling and Court knitting care. Thank you for the courtesy of this consult.  Please call me with any questions or concerns.  Nelida Meuse III  CC: Pcp Not In System

## 2016-06-07 NOTE — Patient Instructions (Signed)
We have sent your demographic and insurance information to [employer]. They should contact you within the next week regarding your Cologuard (colon cancer screening) test. If you have not heard from them within the next week, please call our office at [phone].  If you are age 69 or older, your body mass index should be between 23-30. Your Body mass index is 36.52 kg/m. If this is out of the aforementioned range listed, please consider follow up with your Primary Care Provider.  If you are age 88 or younger, your body mass index should be between 19-25. Your Body mass index is 36.52 kg/m. If this is out of the aformentioned range listed, please consider follow up with your Primary Care Provider.   Thank you for choosing [facility]  Dr Wilfrid Lund III

## 2016-07-05 ENCOUNTER — Other Ambulatory Visit: Payer: Self-pay

## 2016-07-05 LAB — COLOGUARD: Cologuard: NEGATIVE

## 2016-12-04 ENCOUNTER — Ambulatory Visit: Admission: RE | Admit: 2016-12-04 | Discharge: 2016-12-04 | Admitting: Ophthalmology

## 2016-12-04 DIAGNOSIS — H4312 Vitreous hemorrhage, left eye: Principal | ICD-10-CM

## 2016-12-17 ENCOUNTER — Ambulatory Visit
Admission: RE | Admit: 2016-12-17 | Discharge: 2016-12-17 | Disposition: A | Discharge status: Home / Self Care | Attending: Physician Assistant | Admitting: Physician Assistant

## 2016-12-17 ENCOUNTER — Ambulatory Visit
Admission: RE | Admit: 2016-12-17 | Discharge: 2016-12-17 | Disposition: A | Discharge status: Home / Self Care | Payer: MEDICARE

## 2016-12-17 DIAGNOSIS — L03116 Cellulitis of left lower limb: Secondary | ICD-10-CM

## 2016-12-17 DIAGNOSIS — L97522 Non-pressure chronic ulcer of other part of left foot with fat layer exposed: Secondary | ICD-10-CM

## 2016-12-17 DIAGNOSIS — I739 Peripheral vascular disease, unspecified: Secondary | ICD-10-CM

## 2016-12-17 DIAGNOSIS — E11621 Type 2 diabetes mellitus with foot ulcer: Principal | ICD-10-CM

## 2016-12-17 MED ORDER — DOXYCYCLINE HYCLATE 100 MG TABLET
ORAL_TABLET | Freq: Two times a day (BID) | ORAL | 0 refills | 0.00000 days | Status: CP
Start: 2016-12-17 — End: 2016-12-31

## 2016-12-19 MED ORDER — MYCOPHENOLATE SODIUM 180 MG TABLET,DELAYED RELEASE
ORAL_TABLET | Freq: Two times a day (BID) | ORAL | 3 refills | 0 days | Status: CP
Start: 2016-12-19 — End: 2017-12-08

## 2016-12-19 MED ORDER — MYFORTIC 180 MG TABLET,DELAYED RELEASE
ORAL_TABLET | 11 refills | 0 days
Start: 2016-12-19 — End: 2016-12-19

## 2016-12-19 MED ORDER — TACROLIMUS 1 MG CAPSULE
ORAL_CAPSULE | Freq: Two times a day (BID) | ORAL | 3 refills | 0 days | Status: CP
Start: 2016-12-19 — End: 2017-12-04

## 2016-12-19 MED ORDER — PROGRAF 1 MG CAPSULE
ORAL_CAPSULE | 11 refills | 0 days
Start: 2016-12-19 — End: 2016-12-19

## 2016-12-20 MED FILL — MYFORTIC/180MG/TAB: MYFORTIC/180MG/TAB | 30 days supply | Qty: 180 | Fill #0

## 2016-12-20 MED FILL — PROGRAF/1MG/CAP: PROGRAF/1MG/CAP | 30 days supply | Qty: 180 | Fill #0

## 2016-12-26 ENCOUNTER — Ambulatory Visit
Admission: RE | Admit: 2016-12-26 | Discharge: 2016-12-26 | Disposition: A | Discharge status: Home / Self Care | Attending: Vascular Surgery | Admitting: Vascular Surgery

## 2016-12-26 ENCOUNTER — Ambulatory Visit: Admission: RE | Admit: 2016-12-26 | Discharge: 2016-12-26 | Disposition: A | Discharge status: Home / Self Care

## 2016-12-26 DIAGNOSIS — L03116 Cellulitis of left lower limb: Secondary | ICD-10-CM

## 2016-12-26 DIAGNOSIS — Z79899 Other long term (current) drug therapy: Secondary | ICD-10-CM

## 2016-12-26 DIAGNOSIS — E11621 Type 2 diabetes mellitus with foot ulcer: Principal | ICD-10-CM

## 2016-12-26 DIAGNOSIS — L97522 Non-pressure chronic ulcer of other part of left foot with fat layer exposed: Secondary | ICD-10-CM

## 2016-12-26 DIAGNOSIS — D899 Disorder involving the immune mechanism, unspecified: Secondary | ICD-10-CM

## 2016-12-26 DIAGNOSIS — I739 Peripheral vascular disease, unspecified: Secondary | ICD-10-CM

## 2016-12-26 DIAGNOSIS — Z940 Kidney transplant status: Secondary | ICD-10-CM

## 2016-12-26 DIAGNOSIS — I4891 Unspecified atrial fibrillation: Secondary | ICD-10-CM

## 2016-12-26 DIAGNOSIS — L97529 Non-pressure chronic ulcer of other part of left foot with unspecified severity: Secondary | ICD-10-CM

## 2016-12-26 LAB — COMPREHENSIVE METABOLIC PANEL
ALBUMIN: 3.7 g/dL (ref 3.5–5.0)
ALT (SGPT): 30 U/L (ref 19–72)
ANION GAP: 15 mmol/L (ref 9–15)
AST (SGOT): 29 U/L (ref 19–55)
BILIRUBIN TOTAL: 0.5 mg/dL (ref 0.0–1.2)
BLOOD UREA NITROGEN: 58 mg/dL — ABNORMAL HIGH (ref 7–21)
CALCIUM: 8.6 mg/dL (ref 8.5–10.2)
CHLORIDE: 98 mmol/L (ref 98–107)
CO2: 25 mmol/L (ref 22.0–30.0)
CREATININE: 1.64 mg/dL — ABNORMAL HIGH (ref 0.70–1.30)
EGFR MDRD AF AMER: 51 mL/min/{1.73_m2} — ABNORMAL LOW (ref >=60)
GLUCOSE RANDOM: 223 mg/dL — ABNORMAL HIGH (ref 65–179)
POTASSIUM: 4 mmol/L (ref 3.5–5.0)
PROTEIN TOTAL: 6.4 g/dL — ABNORMAL LOW (ref 6.5–8.3)
SODIUM: 138 mmol/L (ref 135–145)

## 2016-12-26 LAB — CBC W/ AUTO DIFF
BASOPHILS ABSOLUTE COUNT: 0 10*9/L (ref 0.0–0.1)
EOSINOPHILS ABSOLUTE COUNT: 0.1 10*9/L (ref 0.0–0.4)
HEMATOCRIT: 45.5 % (ref 41.0–53.0)
HEMOGLOBIN: 15 g/dL (ref 13.5–17.5)
LARGE UNSTAINED CELLS: 2 % (ref 0–4)
MEAN CORPUSCULAR HEMOGLOBIN CONC: 33 g/dL (ref 31.0–37.0)
MEAN CORPUSCULAR HEMOGLOBIN: 27.8 pg (ref 26.0–34.0)
MEAN CORPUSCULAR VOLUME: 84.3 fL (ref 80.0–100.0)
MEAN PLATELET VOLUME: 7.1 fL (ref 7.0–10.0)
MONOCYTES ABSOLUTE COUNT: 0.5 10*9/L (ref 0.2–0.8)
NEUTROPHILS ABSOLUTE COUNT: 5.3 10*9/L (ref 2.0–7.5)
PLATELET COUNT: 245 10*9/L (ref 150–440)
RED CELL DISTRIBUTION WIDTH: 14.2 % (ref 12.0–15.0)
WBC ADJUSTED: 7.2 10*9/L (ref 4.5–11.0)

## 2016-12-26 LAB — EGFR MDRD AF AMER
Glomerular filtration rate/1.73 sq M.predicted.black:ArVRat:Pt:Ser/Plas/Bld:Qn:Creatinine-based formula (MDRD): 51 — ABNORMAL LOW

## 2016-12-26 LAB — TACROLIMUS BLOOD: Lab: 6.3

## 2016-12-26 LAB — PHOSPHORUS
PHOSPHORUS: 4.3 mg/dL (ref 2.9–4.7)
Phosphate:MCnc:Pt:Ser/Plas:Qn:: 4.3

## 2016-12-26 LAB — PROTIME-INR: PROTIME: 35.4 s — ABNORMAL HIGH (ref 10.2–12.8)

## 2016-12-26 LAB — PROTIME: Lab: 35.4 — ABNORMAL HIGH

## 2016-12-26 LAB — MEAN PLATELET VOLUME: Lab: 7.1

## 2016-12-26 NOTE — Unmapped (Signed)
David Ellis is a 71 y.o. yo male who returns today for f/u of a recurrent left DFU. The patient reports no specific problems with his previous dressings and no fevers/chills or other new symptoms. No problems with offloading orthotic. Has been using offloading boot and brings custom footwear for evaluation.     Physical exam:    Well developed well nourished adult patient in no acute distress  Vitals:    12/26/16 0905   BP: 140/67   Pulse: 63   Temp: 36.3 ??C (97.3 ??F)       Chest: normal air entry  Abdomen: abdomen is soft without significant tenderness  Pulses: present  Extremities: diabetic foot ulcer is significantly decreased in size, new granulation in wound bed. Some eschar that is separating and requires debrdiement  Infection: , no wound infection noted..    Wound exam:   Wound 07/19/15 left lateral foot (Active)   Wound Status Not Healed 12/26/2016  9:06 AM   Pain 0 12/26/2016  9:06 AM   Dressing Status      Removed 12/26/2016  9:06 AM   Wound Length (cm)      0.5 12/26/2016  9:06 AM   Wound Width (cm)      0.3 12/26/2016  9:06 AM   Wound Depth (cm)      0.2 12/26/2016  9:06 AM   Area (sq cm) 0.15 12/26/2016  9:06 AM   Volume (mL) 0.03 12/26/2016  9:06 AM   Odor None 12/26/2016  9:06 AM   Margins Undefined edges 12/26/2016  9:06 AM   Peri-wound Assessment      Callus 12/26/2016  9:06 AM   Encounter Subsequent 12/26/2016  9:06 AM   Slough % None 12/26/2016  9:06 AM   Eschar % None 12/26/2016  9:06 AM   Epithelialization % None 12/26/2016  9:06 AM   Granulation % 76-100% 12/26/2016  9:06 AM   Exudate Type      Sero-sanguineous 12/26/2016  9:06 AM   Exudate Amnt      Moderate 12/26/2016  9:06 AM   Thickness Full Thickness 12/26/2016  9:06 AM   Exposed Structure N/A 12/26/2016  9:06 AM   Paring No 12/17/2016 10:23 AM   Texture Callus 12/26/2016  9:06 AM   Moisture Moist 12/26/2016  9:06 AM   Temperature WNL 12/26/2016  9:06 AM   S/S Infection No 12/17/2016 10:23 AM   Hypergranuation No 12/26/2016  9:06 AM   Tunneling      No 12/26/2016  9:06 AM   Undermining     No 12/26/2016  9:06 AM   Sinus Tract No 12/26/2016  9:06 AM   Treatments Cleansed/Irrigation 12/26/2016  9:06 AM   Picture Taken Yes 12/26/2016  9:06 AM   Dressing Dry gauze;Gauze wrap - 4.5 Kerlix;Other (Comment) 12/17/2016 12:46 PM   Length (Pre Debridement) 0.5 cm 12/26/2016  9:47 AM   Width (Pre Debridement) 0.3 cm 12/26/2016  9:47 AM   Depth (Pre Debridement) 0.2 cm 12/26/2016  9:47 AM   Pre-Procedure Pain 0 12/26/2016  9:47 AM   Time Out Taken Yes 12/26/2016  9:47 AM   Instrument Used scalpel 12/26/2016  9:47 AM   Tissue/Material Removed non-viable;callus 12/26/2016  9:47 AM   Bleeding minimal 12/26/2016  9:47 AM   Bleeding controlled with pressure 12/26/2016  9:47 AM   Specimen Taken none 12/26/2016  9:47 AM   Type of Debridement selective 12/26/2016  9:47 AM   Level of Debridement skin, dermis 12/26/2016  9:47 AM   Procedural Pain 0 12/26/2016  9:47 AM   Length (Post-Debridement) 0.5 cm 12/26/2016  9:47 AM   Width (Post Debridement) 0.3 cm 12/26/2016  9:47 AM   Depth (Post-Debridement) 0.2 cm 12/26/2016  9:47 AM   Post Procedural Pain 0 12/26/2016  9:47 AM   Area(Pre-Debridement) 0.15 sq cm 12/26/2016  9:47 AM   Area(Post-Debridement) 0.15 sq cm 12/26/2016  9:47 AM   Volume(Pre-Debridement) 0.03 sq cm 12/26/2016  9:47 AM   Volume(Post-Debridement) 0.03 sq cm 12/26/2016  9:47 AM       Wound 08/25/15 Foot Left Bottom Lateral Fissure (Active)       Wound 09/14/16 left lateral foot (Active)      Based on the necrotic tissue and biofilm present, the decision was made to debride. All Benefits, risks, and alternatives were explained in detail to the patient. A time out was taken prior to the procedure. The area was prepped and draped in standard clinic fashion. Selective debridement was performed to fully debride all devitalized and necrotic tissue down to bleeding granulation tissue with the use of a curette, forceps and scalpel. Topical 4% lidocaine was applied 10 minutes prior to the debridement and the patient tolerated this well. Hemostasis was achieved with pressure and the measurements did not change post-operatively.    Assessment:  Diabetic foot ulceration. Wound is improving No new problems identified. Discussed options for offloading and exercise. Reviewed best ways to avoid excessive pressure on feet while exercising.     Plan:  1. Topical dressing using hydrogel  2. Offloading using diabetic boot  3. F/U per below for re-evaluation and dressing change.

## 2016-12-26 NOTE — Unmapped (Signed)
Diabetic Foot Ulcer: Care Instructions  Your Care Instructions  Diabetes can damage the nerve endings and blood vessels in your feet. That means you are less likely to notice when your feet are injured. A small skin problem like a callus, blister, or cracked skin can turn into a larger sore, called a foot ulcer. Foot ulcers form most often on the pad (ball) of the foot or the bottom of the big toe. You can also get them on the top and bottom of each toe.  Foot ulcers can get infected. If the infection is severe, then tissue in the foot can die. This is called gangrene. In that case, one or more of the toes, part or all of the foot, and sometimes part of the leg may have to be removed (amputated).  Your doctor may have removed the dead tissue and cleaned the ulcer. Your foot wound may be wrapped in a protective bandage. It is very important to keep your weight off your injured foot. After a foot ulcer has formed, it will not heal as long as you keep putting weight on the area.  Always get early treatment for foot problems. A minor irritation can lead to a major problem if it's not taken care of soon.  Follow-up care is a key part of your treatment and safety. Be sure to make and go to all appointments, and call your doctor if you are having problems. It's also a good idea to know your test results and keep a list of the medicines you take.  How can you care for yourself at home?  ?? Follow your doctor's instructions about keeping pressure off the foot ulcer. You may need to use crutches or a wheelchair. Or you may wear a cast or a walking boot.  ?? Follow your doctor's instructions on how to clean the ulcer and change the bandage.  ?? If your doctor prescribed antibiotics, take them as directed. Do not stop taking them just because you feel better. You need to take the full course of antibiotics.  To prevent foot ulcers  ?? Keep your blood sugar close to normal by watching what and how much you eat. Track your blood sugar, take medicines if prescribed, and get regular exercise.  ?? Do not smoke. Smoking affects blood flow and can make foot problems worse. If you need help quitting, talk to your doctor about stop-smoking programs and medicines. These can increase your chances of quitting for good.  ?? Do not go barefoot. Protect your feet by wearing shoes that fit well. Choose shoes that are made of materials that are flexible and breathable, such as leather or cloth.  ?? Inspect your feet daily for blisters, cuts, cracks, or sores. If you can't see well, use a mirror or have someone help you.  ?? Have your doctor check your feet during each visit. If you have a foot problem, see your doctor. Do not try to treat your foot problem on your own. Home remedies or treatments that you can buy without a prescription (such as corn removers) can be harmful.  When should you call for help?  Call your doctor now or seek immediate medical care if:  ?? ?? You have symptoms of infection, such as:  ?? Increased pain, swelling, warmth, or redness.  ?? Red streaks leading from the area.  ?? Pus draining from the area.  ?? A fever.   ??Watch closely for changes in your health, and be sure to contact your  doctor if:  ?? ?? You have a new problem with your feet, such as:  ?? A new sore or ulcer.  ?? A break in the skin that is not healing after several days.  ?? Bleeding corns or calluses.  ?? An ingrown toenail.   ?? ?? You do not get better as expected.   Where can you learn more?  Go to [facility] at [email or web address]  Select Preferences in the upper right hand corner, then select Health Library under Resources. Enter T131 in the search box to learn more about Diabetic Foot Ulcer: Care Instructions.  Current as of: May 10, 2016  Content Version: 11.7  ?? 2006-2018 [employer]. Care instructions adapted under license by [facility]. If you have questions about a medical condition or this instruction, always ask your healthcare professional. [employer] Inc[employer] disclaims any warranty or liability for your use of this information.

## 2016-12-28 ENCOUNTER — Ambulatory Visit
Admission: RE | Admit: 2016-12-28 | Discharge: 2016-12-28 | Disposition: A | Discharge status: Home / Self Care | Payer: MEDICARE | Attending: "Endocrinology | Admitting: "Endocrinology

## 2016-12-28 DIAGNOSIS — E11649 Type 2 diabetes mellitus with hypoglycemia without coma: Secondary | ICD-10-CM

## 2016-12-28 DIAGNOSIS — E11621 Type 2 diabetes mellitus with foot ulcer: Principal | ICD-10-CM

## 2016-12-28 NOTE — Unmapped (Signed)
Personal Freestyle Libre meter uploaded under [employer]. POC glucose done. PP 7:45 am. 220 mg/dL

## 2016-12-28 NOTE — Unmapped (Signed)
ASSESSMENT / PLAN  1. 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 Continue Freestyle Libre with hypoglycemic unawareness.   ?? With some overnight hypoglycemia, we should make slight reduction in dinner time insulin:    Current Regimen:  (9-10am) Breakfast dial up to 90 units (he counts 18 clicks = 90 units)               Reduce to 70 units if premeal glucose 75-100              Reduce 60 units if glucose < 75 AND you are getting ready to eat a full meal  (2pm) Lunch dial up to 60 units (he counts 12 clicks = 60 units)  (7ish) Dinner: +45 u units 101- 200 (he counts 9 clicks = 45 units)              +50   units for glucose 201- 300 (he counts 10 clicks = 50 units)              +55   units 301- 400 (count 11 clicks = 55 units)    If glucose < 100 at meal, reduce dose by 75%, and if glucose is < 75, reduce dose 50%.     RTC 87mo    SUBJECTIVE    PCP: [name]  Tplant: Amy Mottle  Optho:  Wynonia Sours MD, [facility]  Wound: Keagy  Podiatry: Dr. Ether Griffins, [facility]     HPI: David Ellis is a 71 y.o. male who I last saw 08/2016 returns for follow up of T2DM with hx of kidney transplant.    He saw Fletcher Anon 11/2016 with continuation of current plan, though noted improvement in mid day spikes with increased AM dose of insulin.   Complications since last visit: Left foot back in boot and on antibiotics, also s/p vitrectomy though vision in R eye improving.     Maintains u500 insulin, and is now using freestyle libre. Out of doughnut hole, so u500 affordable, and freestyle libre covered.   Trulicity was covered via 11/2017, but not affordable so not using.     On vacation, glucoses definitely incresaed, but since home on diet of increased meat/veg, less carbs, glucoses much more stable during the day though with some concerns of nocturnal hypoglycemia that he typically does not sense, but detects on POC glucose checks before bed 2-3am.        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 Waunita Schooner 12/2016 at [facility].  CKD followed by Nephrology.  Peripheral neuropathy w hx of amputation, now active foot ulcer managed by podiatry.  On Neurontin.  BP controlled on meds including ARB  Lipids on low dose pravachol.  Need to review next visit why not on more powerful statin.  Not on ASA, but chonic coumadin.    Endocrine Obtained / Updated Problem List:   1. T2DM dx 1992, insulin roughly 2000:   ?? Proliferative DR, Stable, Wynonia Sours MD, [facility]  ?? 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 No vibratory 08/2016.   ?? Hypoglycemic emergency requiring EMS 03/2011.   ?? Considered Victoza in past, though he was resistant in setting of costs and already multiple daily injections. Trulicity 600$ per mo.  ?? Offered to check cpeptide to see if he would qualify for insulin pump under medcare, though he is not interested.    ?? Off  MTF since end of 2015/early 2016.   2. 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   3. HTN   4. Nephrolithiasis   5. OSA on cpap   6. Aflutter s/p ablation 06/2011, atrial fibrillation  7. PE    Patient Active Problem List   Diagnosis   ??? Type II diabetes mellitus with renal manifestations, uncontrolled (CMS-HCC)   ??? Hypertension   ??? History of kidney transplant   ??? Mixed hyperlipidemia   ??? 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 Transplanted kidney   ??? Pulmonary embolism (CMS-HCC)   ??? Edema   ??? Right ventricular dysfunction   ??? Atrial fibrillation (CMS-HCC)   ??? Aftercare following organ transplant   ??? Diverticulosis of colon   ??? Routine general medical examination at a health care facility   ??? OSA on CPAP   ??? Diabetic polyneuropathy associated with type 2 diabetes mellitus (CMS-HCC)   ??? Diabetic ulcer of left foot associated with type 2 diabetes mellitus (CMS-HCC)   ??? Toe amputation status (CMS-HCC)   ??? On statin therapy due to risk of future cardiovascular event   ??? Left knee pain   ??? BCC (basal cell carcinoma), face   ??? Critical lower limb ischemia   ??? Acquired absence of other toe(s), unspecified side (CMS-HCC)   ??? History of colonic polyps   ??? Non-pressure chronic ulcer of other part of unspecified foot with unspecified severity (CMS-HCC)   ??? Type 2 diabetes mellitus with foot ulcer (CODE) (CMS-HCC)   ??? 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 Hypoglycemia associated with type 2 diabetes mellitus (CMS-HCC)     Past Medical History:   Diagnosis Date   ??? Atrial flutter (CMS-HCC)    ??? Diabetes mellitus (CMS-HCC)    ??? Diabetic nephropathy (CMS-HCC)    ??? Diabetic retinopathy (CMS-HCC)    ??? Hyperlipidemia    ??? Hypertension    ??? Osteomyelitis (CMS-HCC) June 2016   ??? Pulmonary embolism (CMS-HCC) Dec 2015   ??? 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 Stroke (CMS-HCC)    ??? Transplanted kidney 08/21/2011     Medicines:     Current Outpatient Prescriptions   Medication Sig Dispense Refill   ??? amLODIPine (NORVASC) 5 MG tablet Take 5 mg by mouth daily.     ??? ammonium lactate (LAC-HYDRIN) 12 % lotion Apply 1 application topically Two (2) times a day. 400 g 3   ??? aspirin (ECOTRIN) 81 MG tablet Take 81 mg by mouth continuous as needed. Only when flying and/or traveling by car more than 2 hours.     ??? BD INSULIN SYRINGE ULTRA-FINE 0.3 mL 31 gauge x 15/64 Syrg 1 each by Other route Three (3) times a day. 300 Syringe 3   ??? BD ULTRA-FINE NANO PEN NEEDLE 32 gauge x 5/32 Ndle USE UP TO 3 TIMES DAILY 100 each 11   ??? blood sugar diagnostic (FREESTYLE PRECISION NEO STRIPS) Strp Use to test blood sugar 4 times daily. Dx E11.65. 150 each 5   ??? chlorthalidone (HYGROTON) 25 MG tablet Take 1 tablet (25 mg total) by mouth daily. 90 tablet 3   ??? cholecalciferol, vitamin D3, (VITAMIN D3) 2,000 unit Tab Take 2,000 Units by mouth daily.      ??? doxycycline (VIBRA-TABS) 100 MG tablet Take 1 tablet (100 mg total)  by mouth Two (2) times a day. for 14 days 28 tablet 0   ??? furosemide (LASIX) 40 MG tablet Take 1-2 tablets once daily 180 tablet 3   ??? gabapentin (NEURONTIN) 300 MG capsule TAKE 1 CAPSULE TWICE DAILY 180 capsule 3   ??? glucagon, human recombinant, (GLUCAGON) 1 mg injection Follow package directions for low blood sugar. 1 mg 1   ??? HUMULIN R U-500, CONC, KWIKPEN 500 unit/mL (3 mL) CONCENTRATED injection DIAL TO MAX 100 UNITS BEFORE [facility] AND DINNER.INJECT 30 MINS BEFORE MEALS.MAX OF 300 UNITS DAILY 18 mL 11   ??? hydrocortisone 2.5 % cream   0   ??? ketoconazole (NIZORAL) 2 % cream   0   ??? lancets Misc 1 each by Miscellaneous route Four (4) times a day. 150 each 6   ??? magnesium chloride (SLOW_MAG) 64 mg TbEC Take 128 mg by mouth three (3) times a day (at 6am, noon and 6pm).      ??? metoprolol succinate (TOPROL-XL) 200 MG 24 hr tablet 200 mg by mouth twice daily 180 tablet 3   ??? multivitamin capsule Take 1 capsule by mouth daily.     ??? mycophenolate (MYFORTIC) 180 MG EC tablet Take 3 tablets (540 mg total) by mouth Two (2) times a day. 540 tablet 3   ??? tacrolimus (PROGRAF) 1 MG capsule Take 3 capsules (3 mg total) by mouth Two (2) times a day. Z94.0 540 capsule 3   ??? valsartan (DIOVAN) 80 MG tablet Take 1 tablet (80 mg total) by mouth daily. 90 tablet 3   ??? warfarin (COUMADIN) 2.5 MG tablet Take 1 tablet (2.5 mg total) by mouth Every Monday. 30 tablet 2   ??? warfarin (COUMADIN) 5 MG tablet Take 5 mg every day except mondays 30 tablet 6   ??? blood-glucose meter kit Use as instructed. 1 each 0   ??? pravastatin (PRAVACHOL) 20 MG tablet Take 1 tablet (20 mg total) by mouth daily. 30 tablet 11     No current facility-administered medications for this visit.        Social History: reviewed: his wife was kidney donor. rare etoh. no cig. 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     Other past medical history, medications, allergies and problem list are reviewed in the medical record    REVIEW OF SYSTEMS: Pertinent positives and negatives as in HPI, otherwise, all other systems reviewed are negative.     OBJECTIVE  Vitals:    12/28/16 0845   BP: 112/58   Pulse: 61   Weight: (!) 117.9 kg (259 lb 14.8 oz)   Height: 185.4 cm (6' 0.99)     Body mass index is 34.3 kg/m??.    BP Readings from Last 3 Encounters:   12/28/16 112/58   12/26/16 140/67   12/17/16 158/72     Wt Readings from Last 3 Encounters:   12/28/16 (!) 117.9 kg (259 lb 14.8 oz)   12/26/16 (!) 117.9 kg (260 lb)   11/30/16 (!) 121.7 kg (268 lb 3.2 oz)     GENERAL: well appearing  PSYCH: calm, cooperative with full affect  EYES: eomi, sclera anicteric    Pertinent labs:  Office Visit on 12/28/2016   Component Date Value Ref Range Status   ??? Glucose, POC 12/28/2016 220* 65 - 179 mg/dL Final   Lab on 40/98/1191   Component Date Value Ref Range Status   ??? Sodium 12/26/2016 138  135 - 145 mmol/L Final   ??? Potassium 12/26/2016 4.0  3.5 -  5.0 mmol/L Final   ??? Chloride 12/26/2016 98  98 - 107 mmol/L Final   ??? CO2 12/26/2016 25.0  22.0 - 30.0 mmol/L Final   ??? BUN 12/26/2016 58* 7 - 21 mg/dL Final   ??? Creatinine 12/26/2016 1.64* 0.70 - 1.30 mg/dL Final   ??? BUN/Creatinine Ratio 12/26/2016 35   Final   ??? EGFR MDRD Non Af Amer 12/26/2016 42* >=60 mL/min/1.20m2 Final   ??? EGFR MDRD Af Amer 12/26/2016 51* >=60 mL/min/1.67m2 Final   ??? Anion Gap 12/26/2016 15  9 - 15 mmol/L Final   ??? Glucose 12/26/2016 223* 65 - 179 mg/dL Final   ??? Calcium 16/03/9603 8.6  8.5 - 10.2 mg/dL Final   ??? Albumin 54/02/8118 3.7  3.5 - 5.0 g/dL Final   ??? Total Protein 12/26/2016 6.4* 6.5 - 8.3 g/dL Final   ??? Total Bilirubin 12/26/2016 0.5  0.0 - 1.2 mg/dL Final   ??? AST 14/78/2956 29  19 - 55 U/L Final   ??? ALT 12/26/2016 30  19 - 72 U/L Final   ??? Alkaline Phosphatase 12/26/2016 88  38 - 126 U/L Final   ??? Phosphorus 12/26/2016 4.3  2.9 - 4.7 mg/dL Final   ??? Tacrolimus, Timed 12/26/2016 6.3  ng/mL Final   ??? PT 12/26/2016 35.4* 10.2 - 12.8 sec Final   ??? INR 12/26/2016 3.11   Final   ??? WBC 12/26/2016 7.2  4.5 - 11.0 10*9/L Final   ??? RBC 12/26/2016 5.40  4.50 - 5.90 10*12/L Final   ??? HGB 12/26/2016 15.0  13.5 - 17.5 g/dL Final   ??? HCT 21/30/8657 45.5  41.0 - 53.0 % Final   ??? MCV 12/26/2016 84.3  80.0 - 100.0 fL Final   ??? MCH 12/26/2016 27.8  26.0 - 34.0 pg Final   ??? MCHC 12/26/2016 33.0  31.0 - 37.0 g/dL Final   ??? RDW 84/69/6295 14.2  12.0 - 15.0 % Final   ??? MPV 12/26/2016 7.1  7.0 - 10.0 fL Final   ??? Platelet 12/26/2016 245  150 - 440 10*9/L Final   ??? Absolute Neutrophils 12/26/2016 5.3  2.0 - 7.5 10*9/L Final   ??? Absolute Lymphocytes 12/26/2016 1.3* 1.5 - 5.0 10*9/L Final   ??? Absolute Monocytes 12/26/2016 0.5  0.2 - 0.8 10*9/L Final   ??? Absolute Eosinophils 12/26/2016 0.1  0.0 - 0.4 10*9/L Final   ??? Absolute Basophils 12/26/2016 0.0  0.0 - 0.1 10*9/L Final   ??? Large Unstained Cells 12/26/2016 2  0 - 4 % Final   Office Visit on 12/17/2016   Component Date Value Ref Range Status   ??? Aerobic Culture 12/17/2016 Mixed Gram Positive/Gram Negative Organisms Isolated; This culture was screened and determined to be negative for multi-drug resistant Enterobacteriaceae    Final   ??? Gram Stain Result 12/17/2016 No polymorphonuclear leukocytes seen   Final   ??? Gram Stain Result 12/17/2016 3+ Mixed Gram Positive/Negative Organisms   Final   ??? Sed Rate 12/17/2016 12  0 - 20 mm/h Final   ??? Creatinine 12/17/2016 1.41* 0.70 - 1.30 mg/dL Final   ??? EGFR MDRD Af Amer 12/17/2016 >=60  >=60 mL/min/1.87m2 Final   ??? EGFR MDRD Non Af Amer 12/17/2016 50* >=60 mL/min/1.82m2 Final   ??? WBC 12/17/2016 7.0  4.5 - 11.0 10*9/L Final   ??? RBC 12/17/2016 5.18  4.50 - 5.90 10*12/L Final   ??? HGB 12/17/2016 15.0  13.5 - 17.5 g/dL Final   ??? HCT 28/41/3244 44.4  41.0 -  53.0 % Final   ??? MCV 12/17/2016 85.7  80.0 - 100.0 fL Final   ??? MCH 12/17/2016 29.0  26.0 - 34.0 pg Final   ??? MCHC 12/17/2016 33.8  31.0 - 37.0 g/dL Final   ??? RDW 13/01/6577 13.8  12.0 - 15.0 % Final   ??? MPV 12/17/2016 7.5  7.0 - 10.0 fL Final   ??? Platelet 12/17/2016 222  150 - 440 10*9/L Final   ??? Absolute Neutrophils 12/17/2016 5.4  2.0 - 7.5 10*9/L Final   ??? Absolute Lymphocytes 12/17/2016 1.0* 1.5 - 5.0 10*9/L Final   ??? Absolute Monocytes 12/17/2016 0.3  0.2 - 0.8 10*9/L Final   ??? Absolute Eosinophils 12/17/2016 0.1  0.0 - 0.4 10*9/L Final   ??? Absolute Basophils 12/17/2016 0.0  0.0 - 0.1 10*9/L Final   ??? Large Unstained Cells 12/17/2016 3  0 - 4 % Final   ??? Hypochromasia 12/17/2016 Slight* Not Present Final   Office Visit on 11/30/2016   Component Date Value Ref Range Status   ??? Spec Gravity/POC 11/30/2016 1.010  1.003 - 1.030 Final   ??? PH/POC 11/30/2016 5.0  5.0 - 9.0 Final   ??? Leuk Esterase/POC 11/30/2016 Negative  Negative Final   ??? Nitrite/POC 11/30/2016 Negative  Negative Final   ??? Protein/POC 11/30/2016 Negative  Negative Final   ??? UA Glucose/POC 11/30/2016 Negative  Negative Final   ??? Ketones, POC 11/30/2016 Negative  Negative Final   ??? Bilirubin/POC 11/30/2016 Negative  Negative Final   ??? Blood/POC 11/30/2016 Negative  Negative Final   ??? Urobilinogen/POC 11/30/2016 0.2  0.2 - 1.0 mg/dL Final   Lab on 46/96/2952   Component Date Value Ref Range Status   ??? Sodium 11/30/2016 140  135 - 145 mmol/L Final   ??? Potassium 11/30/2016 4.9  3.5 - 5.0 mmol/L Final   ??? Chloride 11/30/2016 96* 98 - 107 mmol/L Final   ??? CO2 11/30/2016 32.0* 22.0 - 30.0 mmol/L Final   ??? BUN 11/30/2016 43* 7 - 21 mg/dL Final   ??? Creatinine 11/30/2016 1.70* 0.70 - 1.30 mg/dL Final   ??? BUN/Creatinine Ratio 11/30/2016 25   Final   ??? EGFR MDRD Non Af Amer 11/30/2016 40* >=60 mL/min/1.35m2 Final   ??? EGFR MDRD Af Amer 11/30/2016 49* >=60 mL/min/1.48m2 Final   ??? Anion Gap 11/30/2016 12  9 - 15 mmol/L Final   ??? Glucose 11/30/2016 252* 65 - 99 mg/dL Final   ??? Calcium 84/13/2440 9.9  8.5 - 10.2 mg/dL Final   ??? Albumin 03/31/2535 3.8  3.5 - 5.0 g/dL Final   ??? Total Protein 11/30/2016 6.7  6.5 - 8.3 g/dL Final   ??? Total Bilirubin 11/30/2016 0.9  0.0 - 1.2 mg/dL Final   ??? AST 64/40/3474 31  19 - 55 U/L Final   ??? ALT 11/30/2016 41  19 - 72 U/L Final   ??? Alkaline Phosphatase 11/30/2016 75  38 - 126 U/L Final   ??? Phosphorus 11/30/2016 4.1  2.9 - 4.7 mg/dL Final   ??? Tacrolimus, Timed 11/30/2016 7.0  ng/mL Final   ??? PT 11/30/2016 29.1* 10.2 - 12.8 sec Final   ??? INR 11/30/2016 2.55   Final   ??? WBC 11/30/2016 6.6  4.5 - 11.0 10*9/L Final   ??? RBC 11/30/2016 5.67  4.50 - 5.90 10*12/L Final   ??? HGB 11/30/2016 16.2  13.5 - 17.5 g/dL Final   ??? HCT 25/95/6387 48.1  41.0 - 53.0 % Final   ??? MCV 11/30/2016 84.8  80.0 - 100.0 fL Final   ??? MCH 11/30/2016 28.7  26.0 - 34.0 pg Final   ??? MCHC 11/30/2016 33.8  31.0 - 37.0 g/dL Final   ??? RDW 60/45/4098 14.2  12.0 - 15.0 % Final   ??? MPV 11/30/2016 6.9* 7.0 - 10.0 fL Final   ??? Platelet 11/30/2016 223  150 - 440 10*9/L Final   ??? Absolute Neutrophils 11/30/2016 4.7  2.0 - 7.5 10*9/L Final   ??? Absolute Lymphocytes 11/30/2016 1.2* 1.5 - 5.0 10*9/L Final   ??? Absolute Monocytes 11/30/2016 0.4  0.2 - 0.8 10*9/L Final   ??? Absolute Eosinophils 11/30/2016 0.1  0.0 - 0.4 10*9/L Final   ??? Absolute Basophils 11/30/2016 0.0  0.0 - 0.1 10*9/L Final   ??? Large Unstained Cells 11/30/2016 2  0 - 4 % Final     Lab Results   Component Value Date    A1C 8.3 (H) 10/24/2016    A1C 7.5 (H) 10/01/2016    A1C 8.0 (H) 08/08/2016    A1C 8.1 (H) 07/13/2016    A1C 8.0 (H) 10/03/2015    A1C 6.7 (H) 08/04/2014    A1C 7.5 07/20/2014    A1C 7.1 (H) 07/04/2014    A1C 7.6 (H) 05/17/2014    A1C 8.0 (H) 11/23/2013    A1C 6.7 (H) 04/07/2012    A1C 7.4 (H) 03/30/2011     No components found for: SODIUM  Lab Results   Component Value Date    K 4.0 12/26/2016     Lab Results   Component Value Date    CREATININE 1.64 (H) 12/26/2016     Lab Results   Component Value Date    PTH 88.2 (H) 08/08/2016    CALCIUM 8.6 12/26/2016     Lab Results   Component Value Date    ALT 30 12/26/2016    ALT 22 04/21/2015     Lab Results   Component Value Date    CHOL 124 06/14/2016     Lab Results   Component Value Date    LDL 53 (L) 06/14/2016     Lab Results   Component Value Date    HDL 30 (L) 06/14/2016     Lab Results   Component Value Date    TRIG 204 (H) 06/14/2016     Lab Results   Component Value Date    TSH 1.840 07/13/2015    TSH 1.420 07/09/2015    TSH 1.20 07/02/2011     Lab Results   Component Value Date    TSH 1.840 07/13/2015     No results found for: CBC  No results found for: VITAMINB12  Lab Results   Component Value Date    VITD2 <5 08/17/2015    VITD3 42 08/17/2015    VITDTOTAL 42 08/17/2015     No results found for: Janeece Fitting, MALBCRERAT  Lab Results   Component Value Date    PROTEINUR 10.1 08/08/2016    PROTEINUR 15 08/05/2014    PCRATIOUR 0.061 08/08/2016    PCRATIOUR 0.116 08/05/2014       IMAGING:

## 2016-12-28 NOTE — Unmapped (Addendum)
??   Continue Freestyle Libre.   ?? With some overnight hypoglycemia, we should make slight reduction in dinner time insulin:    Current Regimen:  (9-10am) Breakfast dial up to 90 units (he counts 18 clicks = 90 units)               Reduce to 70 units if premeal glucose 75-100              Reduce 60 units if glucose < 75 AND you are getting ready to eat a full meal  (2pm) Lunch dial up to 60 units (he counts 12 clicks = 60 units)  (7ish) Dinner: +45 u units 101- 200 (he counts 9 clicks = 45 units)              +50   units for glucose 201- 300 (he counts 10 clicks = 50 units)              +55   units 301- 400 (count 11 clicks = 55 units)    If glucose < 100 at meal, reduce dose by 75%, and if glucose is < 75, reduce dose 50%.

## 2016-12-31 NOTE — Unmapped (Signed)
-----   Message from Sharlee Blew, MD sent at 12/30/2016  9:11 PM EDT -----  Call and let patient know labs ok except INR is a touch high.  What is current dosing?    Kidneys stable.   Forwarded to nephroloyg.    [name], M.D.     [facility] Internal Medicine at [facility]   [address]  [address]  [place], [place]  [address]  [phone]

## 2016-12-31 NOTE — Unmapped (Signed)
INR high.  Take 5 mg daily ecept Mon and THu, take 2.5.    REpeat INR one week.    [name], M.D.     [facility] Internal Medicine at [facility]   [address]  [address]  [place], [place]  [address]  [phone]

## 2016-12-31 NOTE — Unmapped (Signed)
Patient states that he takes 5 mg coumadin daily, except 2.5 mg on Mondays.  He is unsure if he could have taken an extra dose one time.   He denies diet changes, medication changes.      Please advise.

## 2016-12-31 NOTE — Unmapped (Signed)
Patient notified of dose change, voiced understanding.   Coumadin clinic visit scheduled for Aug 8, and anticoagulation tracker updated.

## 2017-01-01 ENCOUNTER — Ambulatory Visit
Admission: RE | Admit: 2017-01-01 | Discharge: 2017-01-01 | Disposition: A | Discharge status: Home / Self Care | Attending: Vascular Surgery | Admitting: Vascular Surgery

## 2017-01-01 DIAGNOSIS — I739 Peripheral vascular disease, unspecified: Secondary | ICD-10-CM

## 2017-01-01 DIAGNOSIS — L97522 Non-pressure chronic ulcer of other part of left foot with fat layer exposed: Principal | ICD-10-CM

## 2017-01-01 DIAGNOSIS — L97529 Non-pressure chronic ulcer of other part of left foot with unspecified severity: Secondary | ICD-10-CM

## 2017-01-01 DIAGNOSIS — E11621 Type 2 diabetes mellitus with foot ulcer: Secondary | ICD-10-CM

## 2017-01-01 MED ORDER — SILVER SULFADIAZINE 1 % TOPICAL CREAM
Freq: Every day | TOPICAL | 0 refills | 0.00000 days | Status: CP
Start: 2017-01-01 — End: 2018-01-23

## 2017-01-01 NOTE — Unmapped (Signed)
BP 143/62  - Pulse 58  - Temp 36.8 ??C (98.3 ??F) (Temporal)  - Ht 185.4 cm (6' 0.99)  - Wt (!) 117.9 kg (259 lb 14.8 oz)  - BMI 34.30 kg/m??     Wound 07/19/15 left lateral foot (Active)   Wound Status Not Healed 01/01/2017 12:48 PM   Pain 0 01/01/2017 12:48 PM   Dressing Status      Removed 01/01/2017 12:48 PM   Wound Length (cm)      0.5 01/01/2017 12:48 PM   Wound Width (cm)      0.3 01/01/2017 12:48 PM   Wound Depth (cm)      0.2 01/01/2017 12:48 PM   Area (sq cm) 0.15 01/01/2017 12:48 PM   Volume (mL) 0.03 01/01/2017 12:48 PM   Odor None 01/01/2017 12:48 PM   Margins Undefined edges 01/01/2017 12:48 PM   Peri-wound Assessment      Callus 01/01/2017 12:48 PM   Encounter Subsequent 01/01/2017 12:48 PM   Slough % None 01/01/2017 12:48 PM   Eschar % None 01/01/2017 12:48 PM   Epithelialization % None 01/01/2017 12:48 PM   Granulation % 76-100% 01/01/2017 12:48 PM   Exudate Type      Sero-sanguineous 01/01/2017 12:48 PM   Exudate Amnt      Small 01/01/2017 12:48 PM   Thickness Full Thickness 01/01/2017 12:48 PM   Exposed Structure N/A 01/01/2017 12:48 PM   Paring No 12/17/2016 10:23 AM   Texture Callus 01/01/2017 12:48 PM   Moisture Moist 01/01/2017 12:48 PM   Temperature WNL 01/01/2017 12:48 PM   S/S Infection No 12/17/2016 10:23 AM   Hypergranuation No 01/01/2017 12:48 PM   Tunneling      No 01/01/2017 12:48 PM   Undermining     No 01/01/2017 12:48 PM   Sinus Tract No 01/01/2017 12:48 PM   Treatments Cleansed/Irrigation;Pharmaceutical agent 01/01/2017 12:48 PM   Picture Taken No 01/01/2017 12:48 PM   Dressing Dry gauze;Gauze wrap - 4.5 Kerlix;Other (Comment) 12/17/2016 12:46 PM   Length (Pre Debridement) 0.5 cm 01/01/2017  1:25 PM   Width (Pre Debridement) 0.3 cm 01/01/2017  1:25 PM   Depth (Pre Debridement) 0.2 cm 01/01/2017  1:25 PM   Pre-Procedure Pain 0 01/01/2017  1:25 PM   Time Out Taken Yes 01/01/2017  1:25 PM   Instrument Used scalpel;forceps 01/01/2017  1:25 PM   Tissue/Material Removed non-viable;callus;slough 01/01/2017  1:25 PM   Bleeding minimal 01/01/2017  1:25 PM   Bleeding controlled with pressure 01/01/2017  1:25 PM   Specimen Taken none 01/01/2017  1:25 PM   Type of Debridement selective 01/01/2017  1:25 PM   Level of Debridement skin, dermis 01/01/2017  1:25 PM   Procedural Pain 0 01/01/2017  1:25 PM   Length (Post-Debridement) 0.5 cm 01/01/2017  1:25 PM   Width (Post Debridement) 0.4 cm 01/01/2017  1:25 PM   Depth (Post-Debridement) 0.3 cm 01/01/2017  1:25 PM   Post Procedural Pain 0 01/01/2017  1:25 PM   Area(Pre-Debridement) 0.15 sq cm 01/01/2017  1:25 PM   Area(Post-Debridement) 0.2 sq cm 01/01/2017  1:25 PM   Volume(Pre-Debridement) 0.03 sq cm 01/01/2017  1:25 PM   Volume(Post-Debridement) 0.06 sq cm 01/01/2017  1:25 PM       Wound 08/25/15 Foot Left Bottom Lateral Fissure (Active)       Wound 09/14/16 left lateral foot (Active)     This is a patient we are following for wounds on the lateral aspect of left foot  at the site of a previous amputation. At one point the wound had an odor to it however this is much improved.    On physical exam the patient's awake alert in no acute distress. He is able to communicate without difficulty    Vital signs reviewed and the patient's afebrile    The wound on the lateral aspect of the left foot is much improved. There is some scab present in the ulcer itself measures only 3 x 3 mm.    Impression wound is about    Plan we will have him use Silvadene on the wound in addition to the triple cleansing solutions and we will see him back in clinic in a week

## 2017-01-01 NOTE — Unmapped (Addendum)
WOUND CARE INSTRUCTIONS:        Wash area with Antibacterial liquid soap with one wash cloth, clean the soap off with a clean cloth, and then pat dry with a dry cloth before the dressing change.    Dressing: silvadene, gauze kerlix and tape      If you have any questions or concerns regarding your wound or wound care, please contact us at the [facility] clinic at [phone].      If your wound starts to develop the following , please call the [facility] for further advise:    ??  Increased drainage  ??  Redness around the wound  ??  Strong odor from the wound when changing the bandages  ??  Increased pain    Please do not hesitate to leave a voicemail on the nurse line. We make every effort to return your call the same day or the next day. Please leave a clear message with your name, date of birth,  and your medical record number. Leave a brief description of your problem.    If you are experiencing the following, please call us for advise or consider going to the nearest local Emergency Department or call 911.    ??  Fever of 100 F  ??  Nausea or Vomitting  ??  Pus draining from your wound  ??  Redness of the whole foot or leg  ??  Severe increase in pain above your baseline.    [facility]  [phone]

## 2017-01-08 ENCOUNTER — Ambulatory Visit
Admission: RE | Admit: 2017-01-08 | Discharge: 2017-01-08 | Disposition: A | Discharge status: Home / Self Care | Payer: MEDICARE | Attending: Vascular Surgery | Admitting: Vascular Surgery

## 2017-01-08 ENCOUNTER — Ambulatory Visit: Admission: RE | Admit: 2017-01-08 | Discharge: 2017-01-08 | Disposition: A | Discharge status: Home / Self Care

## 2017-01-08 ENCOUNTER — Ambulatory Visit
Admission: RE | Admit: 2017-01-08 | Discharge: 2017-01-08 | Disposition: A | Discharge status: Home / Self Care | Attending: Internal Medicine | Admitting: Internal Medicine

## 2017-01-08 DIAGNOSIS — I4891 Unspecified atrial fibrillation: Secondary | ICD-10-CM

## 2017-01-08 DIAGNOSIS — E785 Hyperlipidemia, unspecified: Secondary | ICD-10-CM

## 2017-01-08 DIAGNOSIS — L97401 Non-pressure chronic ulcer of unspecified heel and midfoot limited to breakdown of skin: Principal | ICD-10-CM

## 2017-01-08 DIAGNOSIS — I10 Essential (primary) hypertension: Principal | ICD-10-CM

## 2017-01-08 MED ORDER — IRBESARTAN 150 MG TABLET
ORAL_TABLET | Freq: Every evening | ORAL | 3 refills | 0 days | Status: CP
Start: 2017-01-08 — End: 2017-09-25

## 2017-01-08 MED ORDER — WARFARIN 5 MG TABLET
ORAL_TABLET | 6 refills | 0 days | Status: CP
Start: 2017-01-08 — End: 2017-11-25

## 2017-01-08 NOTE — Unmapped (Signed)
COnfirmed.  Thanks.  [name], M.D.     [facility] Internal Medicine at [facility]   [address]  [address]  [place], [place]  [address]  [phone]

## 2017-01-08 NOTE — Unmapped (Signed)
BP 151/76  - Pulse 61  - Temp 36.6 ??C (97.9 ??F) (Temporal)     Wound 07/19/15 left lateral foot (Active)   Wound Status Not Healed 01/08/2017 10:09 AM   Pain 0 01/08/2017 10:09 AM   Dressing Status      Removed 01/08/2017 10:09 AM   Wound Length (cm)      0.3 01/08/2017 10:09 AM   Wound Width (cm)      0.3 01/08/2017 10:09 AM   Wound Depth (cm)      0.1 01/08/2017 10:09 AM   Area (sq cm) 0.09 01/08/2017 10:09 AM   Volume (mL) 0.01 01/08/2017 10:09 AM   Odor None 01/08/2017 10:09 AM   Margins Undefined edges 01/08/2017 10:09 AM   Peri-wound Assessment      Callus 01/08/2017 10:09 AM   Encounter Subsequent 01/08/2017 10:09 AM   Slough % None 01/08/2017 10:09 AM   Eschar % None 01/08/2017 10:09 AM   Epithelialization % None 01/08/2017 10:09 AM   Granulation % 76-100% 01/08/2017 10:09 AM   Exudate Type      Sero-sanguineous 01/08/2017 10:09 AM   Exudate Amnt      Small 01/08/2017 10:09 AM   Thickness Full Thickness 01/08/2017 10:09 AM   Exposed Structure N/A 01/08/2017 10:09 AM   Paring No 12/17/2016 10:23 AM   Texture Callus 01/08/2017 10:09 AM   Moisture Moist 01/08/2017 10:09 AM   Temperature WNL 01/08/2017 10:09 AM   S/S Infection No 12/17/2016 10:23 AM   Hypergranuation No 01/08/2017 10:09 AM   Tunneling      No 01/08/2017 10:09 AM   Undermining     No 01/08/2017 10:09 AM   Sinus Tract No 01/08/2017 10:09 AM   Treatments Cleansed/Irrigation;Pharmaceutical agent 01/08/2017 10:09 AM   Picture Taken Yes 01/08/2017 10:09 AM   Dressing Dry gauze;Gauze wrap - 4.5 Kerlix;Other (Comment) 12/17/2016 12:46 PM   Length (Pre Debridement) 0.5 cm 12/26/2016  9:47 AM   Width (Pre Debridement) 0.3 cm 12/26/2016  9:47 AM   Depth (Pre Debridement) 0.2 cm 12/26/2016  9:47 AM   Pre-Procedure Pain 0 12/26/2016  9:47 AM   Time Out Taken Yes 12/26/2016  9:47 AM   Instrument Used scalpel 12/26/2016  9:47 AM   Tissue/Material Removed non-viable;callus 12/26/2016  9:47 AM   Bleeding minimal 12/26/2016  9:47 AM   Bleeding controlled with pressure 12/26/2016  9:47 AM   Specimen Taken none 12/26/2016  9:47 AM Type of Debridement selective 12/26/2016  9:47 AM   Level of Debridement skin, dermis 12/26/2016  9:47 AM   Procedural Pain 0 12/26/2016  9:47 AM   Length (Post-Debridement) 0.5 cm 12/26/2016  9:47 AM   Width (Post Debridement) 0.3 cm 12/26/2016  9:47 AM   Depth (Post-Debridement) 0.2 cm 12/26/2016  9:47 AM   Post Procedural Pain 0 12/26/2016  9:47 AM   Area(Pre-Debridement) 0.15 sq cm 12/26/2016  9:47 AM   Area(Post-Debridement) 0.15 sq cm 12/26/2016  9:47 AM   Volume(Pre-Debridement) 0.03 sq cm 12/26/2016  9:47 AM   Volume(Post-Debridement) 0.03 sq cm 12/26/2016  9:47 AM       Wound 08/25/15 Foot Left Bottom Lateral Fissure (Active)       Wound 09/14/16 left lateral foot (Active)     This is a patient with a wound on the lateral aspect of the left foot. This is at the site of a previous amputation of toes 4 and 5 and a portion of the metatarsal.    On physical  exam the patient's awake alert in no acute distress. He is able to communicate without difficulty    Vital signs were reviewed and the patient's afebrile    The wound shows improvement. The dimensions are much less with the wound diameter of approximately 3 mm. There is minimal depth to the wound and no evidence of infection    Impression healing wound lateral aspect of left foot    Plan he will continue the present dressing. I have given employment for 2 weeks time.

## 2017-01-08 NOTE — Unmapped (Addendum)
Assessment and Plan:   David Ellis is a 71 y.o. male with a history of atrial fibrillation, hypertension, atrial flutter status post ablation, and pulmonary embolism status post catheter directed lytics 05/2013 with subsequent RV dysfunction who presents in clinic today for follow-up.    1. Atrial fibrillation  Symptomatically doing well. Continue metoprolol and warfarin.  We did discuss anticoagulation options and his preference is to remain on warfarin.    2. Hypertension  Blood pressure is well-controlled.  However, he has been on valsartan which has recently been recalled.  We will switch him to a different ARB.  I will contact Dr. Arvin Collard to see if she has a strong preference on which ARB.  Otherwise, may consider irbesartan 150 mg daily.    3. Hyperlipidemia  Continue pravastatin 20 mg daily. As we reviewed, this tends to minimally interact with transplant drugs and has been the statin of choice in our heart transplant program. Non-HDL recently less than 100.      I personally spent more than 25 minutes with the patient and greater than 50% of the time was spent in counseling and coordination of care regarding the above diagnoses.       ADDENDUM:    Heard back from Dr. Arvin Collard who was in agreement with irbesartan.  We will send prescription for 150 mg daily to his pharmacy.    Vevelyn Francois, MD  [facility]  Pager [phone]      Subjective:   PCP: [name], MD  Patient: David Ellis  DOB: Dec 23, 1945    Reason for visit:  Afib, HTN, RV failure  HPI: David Ellis is a 71 y.o. male with a history of atrial fibrillation, hypertension, atrial flutter status post ablation, and pulmonary embolism status post catheter directed lytics 05/2013 with subsequent RV dysfunction who presents in clinic today for follow-up. Since his last visit, he has had a setback with the wound on his foot.  This occurred after a recent trip with his grandchildren.  He has been prescribed a different orthotic and has been instructed to limit weightbearing exercises such as walking around the mall.  Otherwise, he reports that he is feeling well.  He has not noticed any palpitations or atrial fibrillation recently.  He also denies any chest pain, shortness of breath, lightheadedness, fevers, nausea, or other complaints today.  Of note, he reports his blood pressures have typically been in the 130s over 70s at home.  He has been off his valsartan for a few days due to the recent recall and wishes to discuss alternative options today.    ______________________________________________________________________  Pertinent Medical History, Cardiovascular History & Procedures:    Pertinent PMH:   ?? AFib  ?? Typical atrial flutter s/p ablation 06/2011  ?? DM  ?? 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 ESRD s/p transplant  ?? HTN  ?? OSA on CPAP  ?? Skin CA  ?? PE s/p catheter directed lytics 05/2013  ?? 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 Wedge defects in left lung without any thrombus visualized, could represent chronic changes or small subsegmental PEs.    Risk Factors: HTN, DM     Cath / PCI:   ?? None    CV Surgery:   ?? None    EP Procedures and Devices:   ?? None    Non-Invasive Evaluation(s):   ?? TTE 3/17: Normal EF, LVH, grade 1 diastolic dysfunction  ??  Ziopatch 2/16: Rare SVE and VE. 29 episodes of SVT, longest episode lasting 20 beats. No atrial fibrillation identified.  ?? TTE from 06/30/13: Normal EF 55-60%, mildly dilated RV, mild RV dysfunction.  ?? Nuclear stress test 07/2011:  ?? Normal myocardial perfusion study  ?? No evidence for significant ischemia or scar EF 62% and RV is mildly dilated.  ?? Minimal coronary calcification is noted involving the LAD and RCA    ______________________________________________________________________    Other past medical history, social history, family history, medications, allergies and problem list reviewed in the medical record.    Current cardiac medications include:   ?? Metoprolol 200 mg twice daily  ?? Amlodipine 5 mg daily  ?? Aspirin 81 mg daily  ?? Pravastatin 20 mg daily  ?? Chlorthalidone 25 mg daily  ?? Furosemide 40 mg once daily  ?? Valsartan 80 mg daily, switching today  ?? Warfarin      Objective:     BP 132/60  - Pulse 66  - Resp 19  - Ht 185.4 cm (6' 0.99)  - Wt (!) 120.2 kg (265 lb)  - SpO2 96%  - BMI 34.97 kg/m??     PHYSICAL EXAMINATION:   GENERAL: Alert, NAD  EYES: Sclerae clear, EOMI b/l  ENT: OP clear w/o exudate  NECK: Supple  CARDIOVASCULAR: RRR, normal S1/S2, no murmurs, rubs, or gallops.  RESPIRATORY: Clear to auscultation bilaterally. No wheezes, crackles, or rhonchi. Normal work of breathing.  ABDOMEN/GI: Soft, non-tender, non-distended with normoactive bowel sounds.  NEUROLOGIC: CN III-XII in tact, motor exam grossly non-focal.  SKIN: L foot in walking boot  PSYCH: Normal mental status, mood, and affect.  MSK: No effusions      ______________________________________________________________________  EKG 07/10/16 shows sinus bradycardia, left axis deviation, and septal Q waves    Recent CV pertinent labs:  Lab Results   Component Value Date    LDL 53 (L) 06/14/2016    LDL 74 04/21/2015    NONHDL 94 06/14/2016    HDL 30 (L) 06/14/2016    HDL 36 (L) 04/21/2015    INR 3.0 01/08/2017    INR 1.3 07/08/2015    INR 2.80 12/10/2014    PROBNP 375 (H) 06/29/2014    CR 1.36 08/09/2014    CREATININE 1.64 (H) 12/26/2016    CREATININE 1.43 (H) 04/21/2015    K 4.0 12/26/2016    K 4.5 04/21/2015    BUN 58 (H) 12/26/2016    BUN 25 04/21/2015    TSH 1.840 07/13/2015    TSH 1.20 07/02/2011

## 2017-01-08 NOTE — Unmapped (Signed)
Patient came in today for POC INR visit which read 3.0. Patient is currently taking 2.5 mg coumadin on Mondays and Thursdays, and 5 mg all other days. Denies any missed doses, diet changes, bleeding, or new medications.     Last INR was 3.1 on 12/26/16. At that time Dr. Dellis Filbert advised current dosing (above).    Patient advised to continue current dose and recheck INR in 2 weeks per standing order.

## 2017-01-08 NOTE — Unmapped (Signed)
WOUND CARE INSTRUCTIONS:        Wash area with Antibacterial liquid soap with one wash cloth, clean the soap off with a clean cloth, and then pat dry with a dry cloth before the dressing change.    Dressing: silvadene, gauze kerlix and tape      If you have any questions or concerns regarding your wound or wound care, please contact us at the [facility] clinic at [phone].      If your wound starts to develop the following , please call the [facility] for further advise:    ??  Increased drainage  ??  Redness around the wound  ??  Strong odor from the wound when changing the bandages  ??  Increased pain    Please do not hesitate to leave a voicemail on the nurse line. We make every effort to return your call the same day or the next day. Please leave a clear message with your name, date of birth,  and your medical record number. Leave a brief description of your problem.    If you are experiencing the following, please call us for advise or consider going to the nearest local Emergency Department or call 911.    ??  Fever of 100 F  ??  Nausea or Vomitting  ??  Pus draining from your wound  ??  Redness of the whole foot or leg  ??  Severe increase in pain above your baseline.    [facility]  [phone]

## 2017-01-09 NOTE — Unmapped (Signed)
Addended by: Midge Minium on: 01/08/2017 05:43 PM     Modules accepted: Orders

## 2017-01-09 NOTE — Unmapped (Signed)
-----   Message from Yehuda Budd, MD sent at 01/08/2017  5:43 PM EDT -----  Could you please let him know that I heard back from his nephrologist and she was in agreement with me about starting irbesartan 150 mg daily?  I sent a prescription to his pharmacy.    Ree Kida    ----- Message -----  From: Lilia Pro, MD  Sent: 01/08/2017   5:00 PM  To: Yehuda Budd, MD    Cleone Slim Ree Kida- that would be great. Thanks so much!  Amy     ----- Message -----  From: Yehuda Budd, MD  Sent: 01/08/2017   2:16 PM  To: Amy Vinnie Langton, MD    Amy,    I saw [name] today. He seems to be doing well aside from the setback with his foot. As you may remember, he is on valsartan and is affected by the recall. I was planning to switch him to an equivalent dose of irbesartan instead, unless there is a different ARB you prefer.    Thanks,    Ree Kida

## 2017-01-09 NOTE — Unmapped (Signed)
[facility] Refill Coordination Note  Specialty Medication(s): PROGRAF 1MG  AND MYFORTIC 180MG   Additional Medications shipped: NO    JURRELL ROYSTER, DOB: 01/06/1946  Phone: [phone] (home) [phone] (work), Alternate phone contact: N/A  Phone or address changes today?: No  All above HIPAA information was verified with patient.  Shipping Address: [address]  [place] [address]   Insurance changes? No    Completed refill call assessment today to schedule patient's medication shipment from the [facility] [phone]).      Confirmed the medication and dosage are correct and have not changed: Yes, regimen is correct and unchanged.    Confirmed patient started or stopped the following medications in the past month:  No, there are no changes reported at this time.    Are you tolerating your medication?:  Lehi reports tolerating the medication.    ADHERENCE    Prograf 1 mg   Quantity filled last month: 180   # of tablets left on hand: 84      Myfortic 180 mg   Quantity filled last month: 180   # of tablets left on hand: 84    Did you miss any doses in the past 4 weeks? No missed doses reported.    FINANCIAL/SHIPPING    Delivery Scheduled: Yes, Expected medication delivery date: 01/18/17     [facility] did not have any additional questions at this time.    Delivery address validated in [facility] scheduling system: Yes, address listed in [facility] is correct.    We will follow up with patient monthly for standard refill processing and delivery.      Thank you,  Marletta Lor   [facility] Specialty Pharmacist

## 2017-01-09 NOTE — Unmapped (Signed)
Spoke with patient about starting the irbesartan 150 mg daily per Dr. Zenaida Deed. David Ellis said he would start the new med today!

## 2017-01-17 MED FILL — PROGRAF/1MG/CAP: PROGRAF/1MG/CAP | 30 days supply | Qty: 180 | Fill #1

## 2017-01-17 MED FILL — MYFORTIC/180MG/TAB: MYFORTIC/180MG/TAB | 30 days supply | Qty: 180 | Fill #1

## 2017-01-28 ENCOUNTER — Ambulatory Visit
Admission: RE | Admit: 2017-01-28 | Discharge: 2017-01-28 | Disposition: A | Discharge status: Home / Self Care | Payer: MEDICARE | Attending: Pain Medicine | Admitting: Pain Medicine

## 2017-01-28 ENCOUNTER — Ambulatory Visit: Admission: RE | Admit: 2017-01-28 | Discharge: 2017-01-28 | Disposition: A | Discharge status: Home / Self Care

## 2017-01-28 DIAGNOSIS — H4312 Vitreous hemorrhage, left eye: Principal | ICD-10-CM

## 2017-01-28 NOTE — Unmapped (Signed)
Ophthalmology Pre-Operative H&P  ??  ??  History of Present Illness:  71 y.o. male with vitreous hemorrhage OS  ??     ??  Allergies   Allergen Reactions   ??? Enalapril Other (See Comments)     cough  cough   ??? Grass Pollen-[place], Standard      Other reaction(s): ITCHING   ??? Levofloxacin Other (See Comments)     Other reaction(s): Unknown  Unknown   ??? Lidocaine Other (See Comments)     I don't remember States is fine with Bupivicaine   ??? Penicillins      As child  Other reaction(s): UNKNOWN.   (UPDATE: Tolerated amp/sulbactam without side effects during 10/2014 hospitalization)   ??? Egg Derived Rash     rash  rash   ??? Epinephrine Palpitations     Heart palpitations   ??? Lisinopril Rash   ??? Mepivacaine Hcl Palpitations     unspecified       ??  No current facility-administered medications on file prior to encounter.      Current Outpatient Prescriptions on File Prior to Encounter   Medication Sig Dispense Refill   ??? amLODIPine (NORVASC) 5 MG tablet Take 5 mg by mouth daily.     ??? ammonium lactate (LAC-HYDRIN) 12 % lotion Apply 1 application topically Two (2) times a day. 400 g 3   ??? aspirin (ECOTRIN) 81 MG tablet Take 81 mg by mouth continuous as needed. Only when flying and/or traveling by car more than 2 hours.     ??? BD INSULIN SYRINGE ULTRA-FINE 0.3 mL 31 gauge x 15/64 Syrg 1 each by Other route Three (3) times a day. 300 Syringe 3   ??? BD ULTRA-FINE NANO PEN NEEDLE 32 gauge x 5/32 Ndle USE UP TO 3 TIMES DAILY 100 each 11   ??? blood sugar diagnostic (FREESTYLE PRECISION NEO STRIPS) Strp Use to test blood sugar 4 times daily. Dx E11.65. 150 each 5   ??? chlorthalidone (HYGROTON) 25 MG tablet Take 1 tablet (25 mg total) by mouth daily. 90 tablet 3   ??? cholecalciferol, vitamin D3, (VITAMIN D3) 2,000 unit Tab Take 2,000 Units by mouth daily.      ??? furosemide (LASIX) 40 MG tablet Take 1-2 tablets once daily 180 tablet 3   ??? gabapentin (NEURONTIN) 300 MG capsule TAKE 1 CAPSULE TWICE DAILY 180 capsule 3   ??? HUMULIN R U-500, CONC, KWIKPEN 500 unit/mL (3 mL) CONCENTRATED injection DIAL TO MAX 100 UNITS BEFORE [facility] AND DINNER.INJECT 30 MINS BEFORE MEALS.MAX OF 300 UNITS DAILY 18 mL 11   ??? irbesartan (AVAPRO) 150 MG tablet Take 1 tablet (150 mg total) by mouth nightly. 90 tablet 3   ??? lancets Misc 1 each by Miscellaneous route Four (4) times a day. 150 each 6   ??? magnesium chloride (SLOW_MAG) 64 mg TbEC Take 128 mg by mouth three (3) times a day (at 6am, noon and 6pm).      ??? metoprolol succinate (TOPROL-XL) 200 MG 24 hr tablet 200 mg by mouth twice daily 180 tablet 3   ??? multivitamin capsule Take 1 capsule by mouth daily.     ??? mycophenolate (MYFORTIC) 180 MG EC tablet Take 3 tablets (540 mg total) by mouth Two (2) times a day. 540 tablet 3   ??? silver sulfaDIAZINE (SILVADENE) 1 % cream Apply topically daily. 50 g 0   ??? tacrolimus (PROGRAF) 1 MG capsule Take 3 capsules (3 mg total) by mouth Two (2) times  a day. Z94.0 540 capsule 3   ??? warfarin (COUMADIN) 2.5 MG tablet Take 1 tablet (2.5 mg total) by mouth Every Monday. 30 tablet 2   ??? warfarin (COUMADIN) 5 MG tablet Take 5 mg every day except mondays 30 tablet 6   ??? blood-glucose meter kit Use as instructed. 1 each 0   ??? glucagon, human recombinant, (GLUCAGON) 1 mg injection Follow package directions for low blood sugar. (Patient not taking: Reported on 01/28/2017) 1 mg 1   ??? hydrocortisone 2.5 % cream   0   ??? ketoconazole (NIZORAL) 2 % cream   0   ??? pravastatin (PRAVACHOL) 20 MG tablet Take 1 tablet (20 mg total) by mouth daily. 30 tablet 11       ??  Past Medical History:   Diagnosis Date   ??? Atrial flutter (CMS-HCC)    ??? Diabetes mellitus (CMS-HCC)    ??? Diabetic nephropathy (CMS-HCC)    ??? Diabetic retinopathy (CMS-HCC)    ??? Hyperlipidemia    ??? Hypertension    ??? Osteomyelitis (CMS-HCC) June 2016   ??? Pulmonary embolism (CMS-HCC) Dec 2015   ??? 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 Stroke (CMS-HCC)    ??? 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 NEPHRECTOMY TRANSPLANTED ORGAN Right     LURD - spouse received in 2013.   ??? PR AMPUTATION METATARSAL+TOE,SINGLE Left 07/06/2014    Procedure: AMPUTATION, METATARSAL, WITH TOE SINGLE;  Surgeon: Marion Downer, MD;  Location: MAIN OR [facility];  Service: Vascular   ??? PR AMPUTATION METATARSAL+TOE,SINGLE Left 10/28/2014    Procedure: AMPUTATION, METATARSAL, WITH TOE SINGLE;  Surgeon: Maple Mirza, MD;  Location: MAIN OR [facility];  Service: Vascular   ??? PR VITRECTOMY,PANRETINAL LASER RX Right 11/26/2016    Procedure: VITRECTOMY, MECHANICAL, PARS PLANA APPROACH; WITH ENDOLASER PANRETINAL PHOTOCOAGULATION;  Surgeon: Melvyn Novas, MD;  Location[facility];  Service: Ophthalmology   ??? SKIN BIOPSY         ??  Family History   Problem Relation Age of Onset   ??? Kidney disease Mother    ??? Diabetes Mother    ??? Kidney disease Father    ??? Kidney disease Maternal Grandmother    ??? Diabetes Maternal Grandmother      ??       Review of Systems:  10 point review of systems is negative other than HPI.  ??  Objective:   ??  Vitals:    01/28/17 0642   BP: 120/64   Pulse: (!) 49   Resp: 19   Temp: 36.3 ??C (97.3 ??F)   SpO2: 98%     ??  ??  Ocular Exam:  vitreous hemorrhage OS  ??  Impression:  vitreous hemorrhage OS  ??  Recommendations:  Pars plana vitrectomy/endolaser/gas vs oil OS  ??  Please page the ophthalmology consults resident or resident on call with questions.  The [facility], [address] ([address], [address]) can be reached at [phone].

## 2017-01-28 NOTE — Unmapped (Addendum)
Preoperative Diagnosis:   1) Persistent vitreous hemorrhage left eye  2) Proliferative diabetic retinopathy left eye   3)  pseudophakia left eye  Procedure(s) Performed:  1)  Pars plana vitrectomy,  endolaser  left eye      Teaching Surgeon: Murray Hodgkins, M.D.   Assistants: Joanna Hews, MD.   Resident surgeon: Dayna Barker, MD  Anesthesia: Monitored anesthesia care with retrobulbar block.   Specimens: None.   Estimated Blood Loss: Less than 1 mL.  Complications: None.     Indications for Surgery: David Ellis is a 71 y.o. male with persistant vitreous hemorrhage  in the left eye. Risks and benefits of surgery, including but not limited to need for additional surgery, pain, vision loss, infection, hemorrhage, double vision, cataract and blindness were discussed with the patient in detail who agreed to proceed.   Operative Findings: See above.    Procedure: The patient was identified in the holding area and brought to operating room #2 at [facility]. A peribulbar block consisting of 2% lidocaine and 0.75% bupivacaine was administered to the left eye in standard fashion. The right eye was then prepped and draped in the usual sterile fashion and a lid speculum applied. Three trocars of standard 25-gauge vitrectomy were inserted 3.5 mm posterior to the limbus. The wide angle viewing system was brought into place. A core vitrecotmy was performed and the vitreous shave out to the periphery. A PVD was not present. There was a fibrous stalk emanating from the disc which was shaved down and diathermized.  PRP laser was placed 360. The optic nerve was clearly perfused and there was no active hemorrhage.  A scleral depressed exam was performed and no new peripheral retinal pathology was noted. Each sclerotomy was found to be self -sealing. 1.25mg  of avastin was injected. Subconjunctival Dexamethasone and Ancef were injected and the lid speculum was removed. A drop of Vigamox and Atropine was instilled as well as Bacitracin ointment.  A light patch and shield was placed. The patient left the operating room in good condition, having tolerated the procedure well without any complications. Positioning was discussed with the patient.  Post-op Plan/Instructions: A follow up appointment was made for the next day.  Teaching Surgeon Attestation: Dr. Waunita Schooner performed was present and scrubbed for the entirety of the procedure. A fully trained ophthalmologist (Dr. Joanna Hews) was necessary as assistant given the complexity of this case and the need for scleral depression and or bimanual manipulation.

## 2017-01-29 ENCOUNTER — Ambulatory Visit: Admission: RE | Admit: 2017-01-29 | Discharge: 2017-01-29 | Disposition: A | Discharge status: Home / Self Care

## 2017-01-29 DIAGNOSIS — L97529 Non-pressure chronic ulcer of other part of left foot with unspecified severity: Secondary | ICD-10-CM

## 2017-01-29 DIAGNOSIS — E11621 Type 2 diabetes mellitus with foot ulcer: Principal | ICD-10-CM

## 2017-01-29 DIAGNOSIS — H4312 Vitreous hemorrhage, left eye: Principal | ICD-10-CM

## 2017-01-29 DIAGNOSIS — E1129 Type 2 diabetes mellitus with other diabetic kidney complication: Secondary | ICD-10-CM

## 2017-01-29 DIAGNOSIS — I4891 Unspecified atrial fibrillation: Secondary | ICD-10-CM

## 2017-01-29 DIAGNOSIS — Z0000 Encounter for general adult medical examination without abnormal findings: Secondary | ICD-10-CM

## 2017-01-29 DIAGNOSIS — I159 Secondary hypertension, unspecified: Secondary | ICD-10-CM

## 2017-01-29 DIAGNOSIS — Z794 Long term (current) use of insulin: Secondary | ICD-10-CM

## 2017-01-29 DIAGNOSIS — Z940 Kidney transplant status: Secondary | ICD-10-CM

## 2017-01-29 DIAGNOSIS — E1165 Type 2 diabetes mellitus with hyperglycemia: Secondary | ICD-10-CM

## 2017-01-29 DIAGNOSIS — I2699 Other pulmonary embolism without acute cor pulmonale: Secondary | ICD-10-CM

## 2017-01-29 LAB — CBC W/ AUTO DIFF
HEMATOCRIT: 43.8 % (ref 41.0–53.0)
HEMOGLOBIN: 14.4 g/dL (ref 13.5–17.5)
LARGE UNSTAINED CELLS: 2 % (ref 0–4)
LYMPHOCYTES ABSOLUTE COUNT: 1 10*9/L — ABNORMAL LOW (ref 1.5–5.0)
MEAN CORPUSCULAR HEMOGLOBIN CONC: 32.8 g/dL (ref 31.0–37.0)
MEAN CORPUSCULAR VOLUME: 86.6 fL (ref 80.0–100.0)
MEAN PLATELET VOLUME: 8.4 fL (ref 7.0–10.0)
MONOCYTES ABSOLUTE COUNT: 0.5 10*9/L (ref 0.2–0.8)
NEUTROPHILS ABSOLUTE COUNT: 5.2 10*9/L (ref 2.0–7.5)
PLATELET COUNT: 217 10*9/L (ref 150–440)
RED BLOOD CELL COUNT: 5.06 10*12/L (ref 4.50–5.90)
RED CELL DISTRIBUTION WIDTH: 14.2 % (ref 12.0–15.0)
WBC ADJUSTED: 6.9 10*9/L (ref 4.5–11.0)

## 2017-01-29 LAB — COMPREHENSIVE METABOLIC PANEL
ALBUMIN: 3.7 g/dL (ref 3.5–5.0)
ALKALINE PHOSPHATASE: 85 U/L (ref 38–126)
ALT (SGPT): 35 U/L (ref 19–72)
ANION GAP: 14 mmol/L (ref 9–15)
AST (SGOT): 26 U/L (ref 19–55)
BLOOD UREA NITROGEN: 42 mg/dL — ABNORMAL HIGH (ref 7–21)
BUN / CREAT RATIO: 26
CALCIUM: 8.8 mg/dL (ref 8.5–10.2)
CO2: 30 mmol/L (ref 22.0–30.0)
CREATININE: 1.61 mg/dL — ABNORMAL HIGH (ref 0.70–1.30)
EGFR MDRD AF AMER: 52 mL/min/{1.73_m2} — ABNORMAL LOW (ref >=60)
EGFR MDRD NON AF AMER: 43 mL/min/{1.73_m2} — ABNORMAL LOW (ref >=60)
GLUCOSE RANDOM: 421 mg/dL (ref 65–179)
POTASSIUM: 5 mmol/L (ref 3.5–5.0)
PROTEIN TOTAL: 6.4 g/dL — ABNORMAL LOW (ref 6.5–8.3)
SODIUM: 139 mmol/L (ref 135–145)

## 2017-01-29 LAB — URINALYSIS WITH CULTURE REFLEX
BACTERIA: NONE SEEN /HPF
BILIRUBIN UA: NEGATIVE
BLOOD UA: NEGATIVE
GLUCOSE UA: >1000 — AB
KETONES UA: NEGATIVE
LEUKOCYTE ESTERASE UA: NEGATIVE
NITRITE UA: NEGATIVE
SPECIFIC GRAVITY UA: 1.016 (ref 1.003–1.030)
SQUAMOUS EPITHELIAL: <1 /HPF (ref 0–5)
UROBILINOGEN UA: 0.2
WBC UA: <1 /HPF (ref <2)

## 2017-01-29 LAB — LIPID PANEL
CHOLESTEROL/HDL RATIO SCREEN: 3.8 (ref <5.0)
HDL CHOLESTEROL: 31 mg/dL — ABNORMAL LOW (ref 40–59)
NON-HDL CHOLESTEROL: 86 mg/dL
TRIGLYCERIDES: 237 mg/dL — ABNORMAL HIGH (ref 1–149)
VLDL CHOLESTEROL CAL: 47.4 mg/dL — ABNORMAL HIGH (ref 12–42)

## 2017-01-29 LAB — CHOLESTEROL/HDL RATIO SCREEN: Lab: 3.8

## 2017-01-29 LAB — PROSTATE SPECIFIC ANTIGEN: Prostate specific Ag:MCnc:Pt:Ser/Plas:Qn:: 0.66

## 2017-01-29 LAB — LARGE UNSTAINED CELLS: Lab: 2

## 2017-01-29 LAB — BLOOD UREA NITROGEN: Urea nitrogen:MCnc:Pt:Ser/Plas:Qn:: 42 — ABNORMAL HIGH

## 2017-01-29 LAB — INR: Lab: 2.07

## 2017-01-29 LAB — PROTIME-INR: PROTIME: 23.6 s — ABNORMAL HIGH (ref 10.2–12.8)

## 2017-01-29 LAB — WBC UA: Lab: <1

## 2017-01-29 LAB — TACROLIMUS BLOOD: Lab: 7.1

## 2017-01-29 LAB — ESTIMATED AVERAGE GLUCOSE: Estimated average glucose:MCnc:Pt:Bld:Qn:Estimated from glycated hemoglobin: 220

## 2017-01-29 NOTE — Unmapped (Signed)
[facility] called to report a critical value from today's labs   Glucose value is 421.

## 2017-01-29 NOTE — Unmapped (Signed)
Patient ID: David Ellis is a 71 y.o. male who presents for eval of medical issues.      Assessment/Plan:        Atrial fibrillation (CMS-HCC)  Does not appear tob e in afib today?  Remains on coumadin.  Continue.  Check INR todyad.    Diabetic ulcer of left foot associated with type 2 diabetes mellitus (CMS-HCC)  Per wound clinic.  He will address any of these issues with them and is aware of high risk of recurrence and non healing.    Transplanted kidney  Check Tac level and creatiine today.  Has been reasonably stable.    Type II diabetes mellitus with renal manifestations, uncontrolled (CMS-HCC)  Check A1C with lipid toyda.  He reports he is seeing endocrinology next month.  He understanding importance of compliance.  He is uncertain of pravastatin taking and he'll check on that.    Hypertension  Well controlled on current meds.    Labs today; rtc in three months.    Orders Placed This Encounter   Procedures   ??? Comprehensive Metabolic Panel   ??? Lipid Panel   ??? CBC w/ Differential   ??? Hemoglobin A1c   ??? Urinalysis with Culture Reflex   ??? PSA, Diagnostic   ??? PT-INR   ??? Tacrolimus Level, Timed ([place])       -- Discussed the new prescription noted above, including potential side effects, drug interactions, instructions for taking the medication, and the consequences of not taking it.  -- Patient verbalized an understanding of today's assessment and recommendations, as well as the purpose of ongoing medications.    Subjective:     Current Health Status  Patient Active Problem List    Diagnosis Date Noted   ??? Diabetic ulcer of left foot associated with type 2 diabetes mellitus (CMS-HCC) 07/03/2014     Priority: High   ??? OSA on CPAP 02/17/2014     Priority: High   ??? Routine general medical examination at a health care facility 11/17/2013     Priority: High   ??? Atrial fibrillation (CMS-HCC) 07/06/2013     Priority: High   ??? Right ventricular dysfunction 06/30/2013     Priority: High   ??? Pulmonary embolism (CMS-HCC) 05/23/2013     Priority: High   ??? Transplanted kidney      Priority: High   ??? Mixed hyperlipidemia 10/30/2011     Priority: High   ??? Type II diabetes mellitus with renal manifestations, uncontrolled (CMS-HCC) 03/26/2011     Priority: High   ??? Hypertension 10/10/2010     Priority: High   ??? Hypoglycemia associated with type 2 diabetes mellitus (CMS-HCC) 09/03/2016   ??? Critical lower limb ischemia 08/08/2015   ??? BCC (basal cell carcinoma), face 07/18/2015   ??? Left knee pain 11/09/2014   ??? On statin therapy due to risk of future cardiovascular event 10/04/2014   ??? Non-pressure chronic ulcer of other part of unspecified foot with unspecified severity (CMS-HCC) 08/24/2014   ??? Toe amputation status (CMS-HCC) 08/10/2014   ??? Acquired absence of other toe(s), unspecified side (CMS-HCC) 08/10/2014   ??? Type 2 diabetes mellitus with foot ulcer (CODE) (CMS-HCC) 07/03/2014   ??? Diabetic polyneuropathy associated with type 2 diabetes mellitus (CMS-HCC) 03/15/2014   ??? Aftercare following organ transplant 08/27/2013   ??? Edema 06/30/2013   ??? 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 History of kidney transplant 10/30/2011   ??? Type 2 diabetes mellitus with other  diabetic kidney complication (CMS-HCC) 03/26/2011   ??? Diverticulosis of colon 03/09/2011   ??? History of colonic polyps 03/09/2011      Here today for eval of medical issues.    He reports he has been doing ok.    He recently underwent surgery on the left eye.  Underwent vitrectomy by Dr. Waunita Schooner without complications.  No pain in the eye overnight.  Seeing Dr. Waunita Schooner later this morning.      The left foot continues to smolder.  He continues to wear a foot from podiatry to unload the weight off the pressure ulcer on the foot.  He has been following up with them closel.  He is aware of not using sandals or anything to put his foot at higher risk.  He looks at both of his feet every day.      Remains on coumadin for afib and PE through me.  No bleeding problems.  Needs INR today.    Continues to see Dr. Leretha Pol for DM and remains compliant with insulin.  Denies any hypoglycemia.    NO other concerns.  The patient is without complaints of chest pain, sob, fever, chills, nausea, vomiting, diarrhea, constipation, BRBPR, melena, abdominal pains, weight changes, confusion, headaches.        Allergies   Allergen Reactions   ??? Enalapril Other (See Comments)     cough  cough   ??? Grass Pollen-[place], Standard      Other reaction(s): ITCHING   ??? Levofloxacin Other (See Comments)     Other reaction(s): Unknown  Unknown   ??? Lidocaine Other (See Comments)     I don't remember States is fine with Bupivicaine   ??? Penicillins      As child  Other reaction(s): UNKNOWN.   (UPDATE: Tolerated amp/sulbactam without side effects during 10/2014 hospitalization)   ??? Egg Derived Rash     rash  rash   ??? Epinephrine Palpitations     Heart palpitations   ??? Lisinopril Rash   ??? Mepivacaine Hcl Palpitations     unspecified       Current Outpatient Prescriptions   Medication Sig Dispense Refill   ??? amLODIPine (NORVASC) 5 MG tablet Take 5 mg by mouth daily.     ??? BD INSULIN SYRINGE ULTRA-FINE 0.3 mL 31 gauge x 15/64 Syrg 1 each by Other route Three (3) times a day. 300 Syringe 3   ??? BD ULTRA-FINE NANO PEN NEEDLE 32 gauge x 5/32 Ndle USE UP TO 3 TIMES DAILY 100 each 11   ??? blood sugar diagnostic (FREESTYLE PRECISION NEO STRIPS) Strp Use to test blood sugar 4 times daily. Dx E11.65. 150 each 5   ??? chlorthalidone (HYGROTON) 25 MG tablet Take 1 tablet (25 mg total) by mouth daily. 90 tablet 3   ??? cholecalciferol, vitamin D3, (VITAMIN D3) 2,000 unit Tab Take 2,000 Units by mouth daily.      ??? furosemide (LASIX) 40 MG tablet Take 1-2 tablets once daily 180 tablet 3   ??? gabapentin (NEURONTIN) 300 MG capsule TAKE 1 CAPSULE TWICE DAILY 180 capsule 3   ??? HUMULIN R U-500, CONC, KWIKPEN 500 unit/mL (3 mL) CONCENTRATED injection DIAL TO MAX 100 UNITS BEFORE [facility] [place] AND DINNER.INJECT 30 MINS BEFORE MEALS.MAX OF 300 UNITS DAILY 18 mL 11   ??? irbesartan (AVAPRO) 150 MG tablet Take 1 tablet (150 mg total) by mouth nightly. 90 tablet 3   ??? lancets Misc 1 each by Miscellaneous route Four (4) times a day.  150 each 6   ??? magnesium chloride (SLOW_MAG) 64 mg TbEC Take 128 mg by mouth three (3) times a day (at 6am, noon and 6pm).      ??? metoprolol succinate (TOPROL-XL) 200 MG 24 hr tablet 200 mg by mouth twice daily 180 tablet 3   ??? multivitamin capsule Take 1 capsule by mouth daily.     ??? mycophenolate (MYFORTIC) 180 MG EC tablet Take 3 tablets (540 mg total) by mouth Two (2) times a day. 540 tablet 3   ??? tacrolimus (PROGRAF) 1 MG capsule Take 3 capsules (3 mg total) by mouth Two (2) times a day. Z94.0 540 capsule 3   ??? warfarin (COUMADIN) 2.5 MG tablet Take 1 tablet (2.5 mg total) by mouth Every Monday. 30 tablet 2   ??? warfarin (COUMADIN) 5 MG tablet Take 5 mg every day except mondays 30 tablet 6   ??? ammonium lactate (LAC-HYDRIN) 12 % lotion Apply 1 application topically Two (2) times a day. 400 g 3   ??? aspirin (ECOTRIN) 81 MG tablet Take 81 mg by mouth continuous as needed. Only when flying and/or traveling by car more than 2 hours.     ??? blood-glucose meter kit Use as instructed. 1 each 0   ??? glucagon, human recombinant, (GLUCAGON) 1 mg injection Follow package directions for low blood sugar. 1 mg 1   ??? hydrocortisone 2.5 % cream   0   ??? ketoconazole (NIZORAL) 2 % cream   0   ??? pravastatin (PRAVACHOL) 20 MG tablet Take 1 tablet (20 mg total) by mouth daily. (Patient not taking: Reported on 01/29/2017) 30 tablet 11   ??? silver sulfaDIAZINE (SILVADENE) 1 % cream Apply topically daily. 50 g 0     No current facility-administered medications for this visit.      Facility-Administered Medications Ordered in Other Visits   Medication Dose Route Frequency Provider Last Rate Last Dose   ??? bevacizumab    PRN (once a day) Melvyn Novas, MD   2.5 mg at 01/28/17 0750       Past Medical History:   Diagnosis Date   ??? Atrial flutter (CMS-HCC)    ??? Diabetes mellitus (CMS-HCC)    ??? Diabetic nephropathy (CMS-HCC)    ??? Diabetic retinopathy (CMS-HCC)    ??? Hyperlipidemia    ??? Hypertension    ??? Osteomyelitis (CMS-HCC) June 2016   ??? Pulmonary embolism (CMS-HCC) Dec 2015   ??? 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 Stroke (CMS-HCC)    ??? 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 NEPHRECTOMY TRANSPLANTED ORGAN Right     LURD - spouse received in 2013.   ??? PR AMPUTATION METATARSAL+TOE,SINGLE Left 07/06/2014    Procedure: AMPUTATION, METATARSAL, WITH TOE SINGLE;  Surgeon: Marion Downer, MD;  Location: MAIN OR [facility];  Service: Vascular   ??? PR AMPUTATION METATARSAL+TOE,SINGLE Left 10/28/2014    Procedure: AMPUTATION, METATARSAL, WITH TOE SINGLE;  Surgeon: Maple Mirza, MD;  Location: MAIN OR [facility];  Service: Vascular   ??? PR VITRECTOMY,PANRETINAL LASER RX Right 11/26/2016    Procedure: VITRECTOMY, MECHANICAL, PARS PLANA APPROACH; WITH ENDOLASER PANRETINAL PHOTOCOAGULATION;  Surgeon: Melvyn Novas, MD;  Location: [facility];  Service: Ophthalmology   ??? SKIN BIOPSY         Family History   Problem Relation Age of Onset   ???  Kidney disease Mother    ??? Diabetes Mother    ??? Kidney disease Father    ??? Kidney disease Maternal Grandmother    ??? Diabetes Maternal Grandmother        Social History   Substance Use Topics   ??? Smoking status: Never Smoker   ??? Smokeless tobacco: Never Used   ??? Alcohol use No             Objective:       Vital Signs  BP 120/64  - Pulse 59  - Temp 36.4 ??C (97.6 ??F) (Oral)  - Ht 182.9 cm (6')  - Wt (!) 121.1 kg (267 lb)  - SpO2 97%  - BMI 36.21 kg/m??      Exam  Physical Exam:    Well developed, well nourished male in no acute distress.      Vitals:    01/29/17 0842   BP: 120/64   Pulse: 59   Temp: 36.4 ??C (97.6 ??F)   SpO2: 97%     LUNGS:  CTA  CARDIOVASCULAR:  RRR without murmurs, or gallops, or rubs.  Pulses 2+ bilaterally and symmetric.    ABDOMEN:  NABS/ NT/ND/soft.  NO hsm nor abdominal masses.    EXTREMITIES:  No c/c/e.  No ulcers or abrasions.

## 2017-01-29 NOTE — Unmapped (Signed)
Check labs today for   Coumadin, sugar, kidney, tac and psa.  I will let you know results.    Meds confirmed BUT  Please confirm you are on pravastatin 20  Mg daily (this is for chol).    Bp is ok.    Back every three months.

## 2017-01-29 NOTE — Unmapped (Signed)
Post-op phone call not required due to next day follow-up with surgeon.

## 2017-01-29 NOTE — Unmapped (Signed)
1 day postop. Doing well. IOP WNL, Retinal all flat. Start Pred acetate qid, vigamox qid, scop bid. Instructions reviewed and handout given.   RTC 1 week

## 2017-01-30 MED ORDER — PRAVASTATIN 20 MG TABLET
ORAL_TABLET | Freq: Every day | ORAL | 11 refills | 0 days | Status: CP
Start: 2017-01-30 — End: 2017-07-09

## 2017-01-30 NOTE — Unmapped (Signed)
Well-controlled on current meds 

## 2017-01-30 NOTE — Unmapped (Signed)
Per wound clinic.  He will address any of these issues with them and is aware of high risk of recurrence and non healing.

## 2017-01-30 NOTE — Unmapped (Signed)
Does not appear tob e in afib today?  Remains on coumadin.  Continue.  Check INR todyad.

## 2017-01-30 NOTE — Unmapped (Signed)
Check A1C with lipid toyda.  He reports he is seeing endocrinology next month.  He understanding importance of compliance.  He is uncertain of pravastatin taking and he'll check on that.

## 2017-01-30 NOTE — Unmapped (Signed)
I caled and spoke with aptient.   He reports sugars this evening 270.     A1C high however.    9.3.  This is incresaing.    He'll call and speak with Dr. Lanora Manis about further care: re: diabetes.      [name], M.D.     [facility] at [facility]   [address]  [address]  [place], [place]  [address]  [phone]

## 2017-01-30 NOTE — Unmapped (Signed)
Check Tac level and creatiine today.  Has been reasonably stable.

## 2017-01-31 ENCOUNTER — Ambulatory Visit
Admission: RE | Admit: 2017-01-31 | Discharge: 2017-01-31 | Disposition: A | Discharge status: Home / Self Care | Payer: MEDICARE | Attending: Vascular Surgery | Admitting: Vascular Surgery

## 2017-01-31 DIAGNOSIS — E08621 Diabetes mellitus due to underlying condition with foot ulcer: Principal | ICD-10-CM

## 2017-01-31 DIAGNOSIS — L97401 Non-pressure chronic ulcer of unspecified heel and midfoot limited to breakdown of skin: Secondary | ICD-10-CM

## 2017-01-31 NOTE — Unmapped (Signed)
Patient notified and voiced understanding.    Anticoagulation tracker updated.

## 2017-01-31 NOTE — Unmapped (Signed)
-----   Message from Sharlee Blew, MD sent at 01/31/2017 10:16 AM EDT -----  Call and letaptient know labs stable except sugars high.  Forwarded to Dr. Tiburcio Pea his endocrinologist.      Kidney stable.    INR ok.  Same dose; repeat INR one month.    [name], M.D.     [facility] at [facility]   [address]  [address]  [place], [place]  [address]  [phone]

## 2017-01-31 NOTE — Unmapped (Signed)
Left a message for patient to return our call.

## 2017-01-31 NOTE — Unmapped (Signed)
WOUND CARE INSTRUCTIONS:      ??  Wash area with Antibacterial liquid soap with one wash cloth, clean the soap off with a clean cloth, and then pat dry with a dry cloth before the dressing change.  ??  Dressing: iodoform packing strip, gently packed into areas with depth and laid across the superficial areas  ??  ??  If you have any questions or concerns regarding your wound or wound care, please contact us at the [facility] clinic at [phone].  ??  ??  If your wound starts to develop the following , please call the [facility] for further advise:  ??  ??             Increased drainage  ??             Redness around the wound  ??             Strong odor from the wound when changing the bandages  ??             Increased pain  ??  Please do not hesitate to leave a voicemail on the nurse line. We make every effort to return your call the same day or the next day. Please leave a clear message with your name, date of birth,  and your medical record number. Leave a brief description of your problem.  ??  If you are experiencing the following, please call us for advise or consider going to the nearest local Emergency Department or call 911.  ??  ??             Fever of 100 F  ??             Nausea or Vomitting  ??             Pus draining from your wound  ??             Redness of the whole foot or leg  ??             Severe increase in pain above your baseline.  ??  [facility]

## 2017-01-31 NOTE — Unmapped (Signed)
BP 160/81  - Pulse 67  - Temp 36.5 ??C (97.7 ??F) (Temporal)     Wound 07/19/15 left lateral foot (Active)   Wound Status Not Healed 01/31/2017  8:54 AM   Pain 0 01/31/2017  8:54 AM   Dressing Status      Removed 01/31/2017  8:54 AM   Wound Length (cm)      0.3 01/31/2017  8:54 AM   Wound Width (cm)      0.3 01/31/2017  8:54 AM   Wound Depth (cm)      0.1 01/31/2017  8:54 AM   Area (sq cm) 0.09 01/31/2017  8:54 AM   Volume (mL) 0.01 01/31/2017  8:54 AM   Odor None 01/31/2017  8:54 AM   Margins Undefined edges 01/31/2017  8:54 AM   Peri-wound Assessment      Callus 01/31/2017  8:54 AM   Encounter Subsequent 01/31/2017  8:54 AM   Slough % None 01/31/2017  8:54 AM   Eschar % None 01/31/2017  8:54 AM   Epithelialization % None 01/31/2017  8:54 AM   Granulation % 76-100% 01/31/2017  8:54 AM   Exudate Type      Sero-sanguineous 01/31/2017  8:54 AM   Exudate Amnt      Small 01/31/2017  8:54 AM   Thickness Full Thickness 01/31/2017  8:54 AM   Exposed Structure N/A 01/31/2017  8:54 AM   Texture Callus 01/31/2017  8:54 AM   Moisture Moist 01/31/2017  8:54 AM   Temperature WNL 01/31/2017  8:54 AM   Hypergranuation No 01/31/2017  8:54 AM   Tunneling      No 01/31/2017  8:54 AM   Undermining     No 01/31/2017  8:54 AM   Sinus Tract No 01/31/2017  8:54 AM   Treatments Cleansed/Irrigation;Pharmaceutical agent 01/31/2017  8:54 AM   Picture Taken Yes 01/31/2017  8:54 AM   Dressing Other (Comment) 01/31/2017  9:06 AM   Length (Pre Debridement) 0.3 cm 01/31/2017  9:06 AM   Width (Pre Debridement) 0.3 cm 01/31/2017  9:06 AM   Depth (Pre Debridement) 0.1 cm 01/31/2017  9:06 AM   Pre-Procedure Pain 0 01/31/2017  9:06 AM   Time Out Taken Yes 01/31/2017  9:06 AM   Instrument Used scalpel;forceps 01/31/2017  9:06 AM   Tissue/Material Removed non-viable;callus 01/31/2017  9:06 AM   Bleeding none 01/31/2017  9:06 AM   Bleeding controlled with n/a 01/31/2017  9:06 AM   Specimen Taken none 01/31/2017  9:06 AM   Type of Debridement selective 01/31/2017  9:06 AM   Level of Debridement skin, dermis 01/31/2017  9:06 AM   Procedural Pain 0 01/31/2017  9:06 AM   Length (Post-Debridement) 0.3 cm 01/31/2017  9:06 AM   Width (Post Debridement) 0.3 cm 01/31/2017  9:06 AM   Depth (Post-Debridement) 0.1 cm 01/31/2017  9:06 AM   Post Procedural Pain 0 01/31/2017  9:06 AM   Area(Pre-Debridement) 0.09 sq cm 01/31/2017  9:06 AM   Area(Post-Debridement) 0.09 sq cm 01/31/2017  9:06 AM   Volume(Pre-Debridement) 0.01 sq cm 01/31/2017  9:06 AM   Volume(Post-Debridement) 0.01 sq cm 01/31/2017  9:06 AM       Wound 08/25/15 Foot Left Bottom Lateral Fissure (Active)       Wound 09/14/16 left lateral foot (Active)     Based on the necrotic tissue and biofilm present, the decision was made to debride. All Benefits, risks, and alternatives were explained in detail to the patient. A time  out was taken prior to the procedure. The area was prepped and draped in standard clinic fashion. Selective debridement was performed to fully debride all devitalized and necrotic tissue down to bleeding granulation tissue with the use of a curette, forceps and scalpel. Topical 4% lidocaine was applied 10 minutes prior to the debridement and the patient tolerated this well. Hemostasis was achieved with pressure and the measurements did not change post-operatively.    This is a patient with a wound on the lateral aspect of the left foot. He is had minor toe amputations on that side as well as a portion of the metatarsals. He periodically gets ulcers in this area    On physical exam the patient's awake alert in no acute distress. He is able to communicate without difficulty    Vital signs were reviewed and the patient's afebrile    There is callus present on the lateral aspect of the left foot. After this was removed as is noted there are 2 ulcers present. 1 him which is most posterior one has a depth of approximately 1 cm. The anterior wound has a depth of 5 mm    Impression ulcers as above    Plan we will have him pack the wound with iodoform gauze. I will see him again in 7-10 days. If there is not significant improvement obtain an x-ray at that time.

## 2017-02-01 NOTE — Unmapped (Signed)
Called pt in regards to note from Dr. Dellis Filbert w rising HgbA1c and glu > 400.   Pt feels like this was a blip, and improving with glu this AM of 91.   He does still have wound, and is less active.   Request review of info.    Will see if RN can download his freestyle when in building to see wound care on 9/11.   I could then review data.   Order placed.  If RN cannot see him, he will try downloading at home, and bringing to me.

## 2017-02-01 NOTE — Unmapped (Signed)
-----   Message from Sharlee Blew, MD sent at 01/31/2017 10:16 AM EDT -----  Call and letaptient know labs stable except sugars high.  Forwarded to Dr. Tiburcio Pea his endocrinologist.      Kidney stable.    INR ok.  Same dose; repeat INR one month.    [name], M.D.     [facility] at [facility]   [address]  [address]  [place], [place]  [address]  [phone]

## 2017-02-11 NOTE — Unmapped (Signed)
[facility] Refill Coordination Note  Specialty Medication(s): MYFORTIC 180MG  AND PROGRAF 1MG    Additional Medications shipped: NONE     CUONG MOORMAN, DOB: 1945/10/28  Phone: [phone] (home) [phone] (work), Alternate phone contact: N/A  Phone or address changes today?: No  All above HIPAA information was verified with patient.  Shipping Address: [address]  [place] [address]   Insurance changes? No    Completed refill call assessment today to schedule patient's medication shipment from the [facility] [phone]).      Confirmed the medication and dosage are correct and have not changed: Yes, regimen is correct and unchanged.    Confirmed patient started or stopped the following medications in the past month:  No, there are no changes reported at this time.    Are you tolerating your medication?:  Harris reports tolerating the medication.    ADHERENCE    (Below is required for Medicare Part B or Transplant patients only - per drug):   Prograf 1 mg   Quantity filled last month: 180   # of tablets left on hand: 42  Myfortic 180 mg   Quantity filled last month: 180   # of tablets left on hand: 42        Did you miss any doses in the past 4 weeks? No missed doses reported.    FINANCIAL/SHIPPING    Delivery Scheduled: Yes, Expected medication delivery date: 02/19/2017     Bing did not have any additional questions at this time.    Delivery address validated in [facility] scheduling system: Yes, address listed in [facility] is correct.    We will follow up with patient monthly for standard refill processing and delivery.      Thank you,  Dorothea Ogle   [facility] Specialty Technician

## 2017-02-12 ENCOUNTER — Ambulatory Visit: Admission: RE | Admit: 2017-02-12 | Discharge: 2017-02-12 | Disposition: A | Discharge status: Home / Self Care

## 2017-02-12 ENCOUNTER — Ambulatory Visit
Admission: RE | Admit: 2017-02-12 | Discharge: 2017-02-12 | Disposition: A | Discharge status: Home / Self Care | Payer: MEDICARE

## 2017-02-12 ENCOUNTER — Ambulatory Visit
Admission: RE | Admit: 2017-02-12 | Discharge: 2017-02-12 | Disposition: A | Discharge status: Home / Self Care | Attending: Vascular Surgery | Admitting: Vascular Surgery

## 2017-02-12 DIAGNOSIS — E11621 Type 2 diabetes mellitus with foot ulcer: Secondary | ICD-10-CM

## 2017-02-12 DIAGNOSIS — I739 Peripheral vascular disease, unspecified: Principal | ICD-10-CM

## 2017-02-12 DIAGNOSIS — H4312 Vitreous hemorrhage, left eye: Principal | ICD-10-CM

## 2017-02-12 DIAGNOSIS — L97522 Non-pressure chronic ulcer of other part of left foot with fat layer exposed: Secondary | ICD-10-CM

## 2017-02-12 NOTE — Unmapped (Signed)
Freestyle libre flash CGM uploaded and save to patient file. Routed to MD

## 2017-02-12 NOTE — Unmapped (Signed)
BP 152/76  - Pulse 57  - Temp 36.8 ??C (98.3 ??F) (Oral)     Wound 07/19/15 left lateral foot scattered (Active)   Wound Status Not Healed 02/12/2017 10:17 AM   Pain 0 02/12/2017 10:17 AM   Dressing Status      Removed 02/12/2017 10:17 AM   Wound Length (cm)      1.5 02/12/2017 10:17 AM   Wound Width (cm)      1 02/12/2017 10:17 AM   Wound Depth (cm)      0.2 02/12/2017 10:17 AM   Area (sq cm) 1.5 02/12/2017 10:17 AM   Volume (mL) 0.3 02/12/2017 10:17 AM   Odor None 02/12/2017 10:17 AM   Margins Undefined edges 02/12/2017 10:17 AM   Peri-wound Assessment      Callus 02/12/2017 10:17 AM   Encounter Subsequent 02/12/2017 10:17 AM   Slough % None 02/12/2017 10:17 AM   Eschar % None 02/12/2017 10:17 AM   Epithelialization % None 02/12/2017 10:17 AM   Granulation % 76-100% 02/12/2017 10:17 AM   Exudate Type      Sero-sanguineous 02/12/2017 10:17 AM   Exudate Amnt      Moderate 02/12/2017 10:17 AM   Thickness Full Thickness 02/12/2017 10:17 AM   Exposed Structure N/A 02/12/2017 10:17 AM   Texture Callus 02/12/2017 10:17 AM   Moisture Dry/Scaly 02/12/2017 10:17 AM   Temperature WNL 02/12/2017 10:17 AM   Hypergranuation No 02/12/2017 10:17 AM   Tunneling      No 02/12/2017 10:17 AM   Undermining     No 02/12/2017 10:17 AM   Sinus Tract No 02/12/2017 10:17 AM   Treatments Cleansed/Irrigation;Pharmaceutical agent 02/12/2017 10:17 AM   Picture Taken Yes 02/12/2017 10:17 AM   Dressing Other (Comment) 01/31/2017  9:06 AM   Length (Pre Debridement) 1.5 cm 02/12/2017 10:27 AM   Width (Pre Debridement) 1 cm 02/12/2017 10:27 AM   Depth (Pre Debridement) 0.2 cm 02/12/2017 10:27 AM   Pre-Procedure Pain 0 02/12/2017 10:27 AM   Time Out Taken Yes 02/12/2017 10:27 AM   Instrument Used scalpel 02/12/2017 10:27 AM   Tissue/Material Removed non-viable;slough 02/12/2017 10:27 AM   Bleeding minimal 02/12/2017 10:27 AM   Bleeding controlled with pressure 02/12/2017 10:27 AM   Specimen Taken none 02/12/2017 10:27 AM   Type of Debridement selective 02/12/2017 10:27 AM   Level of Debridement skin, dermis 02/12/2017 10:27 AM   Procedural Pain 0 02/12/2017 10:27 AM   Length (Post-Debridement) 1.5 cm 02/12/2017 10:27 AM   Width (Post Debridement) 1 cm 02/12/2017 10:27 AM   Depth (Post-Debridement) 0.5 cm 02/12/2017 10:27 AM   Post Procedural Pain 0 02/12/2017 10:27 AM   Area(Pre-Debridement) 1.5 sq cm 02/12/2017 10:27 AM   Area(Post-Debridement) 1.5 sq cm 02/12/2017 10:27 AM   Volume(Pre-Debridement) 0.3 sq cm 02/12/2017 10:27 AM   Volume(Post-Debridement) 0.75 sq cm 02/12/2017 10:27 AM       Wound 08/25/15 Foot Left Bottom Lateral Fissure (Active)       Wound 09/14/16 left lateral foot (Active)     This is a patient with a wound on the lateral aspect of the left foot. Of note is he said previous minor amputations in that location.    On physical exam the patient's awake alert in no acute distress. He is able to communicate without difficulty    Vital signs were reviewed and the patient's afebrile    The wound on the lateral aspect of the left foot still has 2 areas with depth  of approximately 5 mm. There is no purulent drainage none evidence of infection. However I'm able to go down to the bone on the most anterior wound.    Impression wounds as above    Plan:  1. We will continue packing with iodoform gauze  2. We will obtain an x-ray today to rule out osteomyelitis.

## 2017-02-12 NOTE — Unmapped (Signed)
3 week postop. Doing well. IOP WNL, Retinal all flat.   Start taper Pred acetate to TID for 1 week, BID for 1 week and QD, stop other drops.   RTC 12 weeks

## 2017-02-12 NOTE — Unmapped (Signed)
Dressing:    Wash area with Antibacterial liquid soap with one wash cloth, clean the soap off with a clean cloth, and then pat dry with a dry cloth before the dressing change.    Continue with you current dressing of iodoform packing, and 4 sided adhesive band-aid      If you have any questions or concerns regarding your wound or wound care, please contact us at the [facility] Wound Healing and Podiatry clinic at [phone].        Today Dr Clide Dales ordered you [facility]  Address: [address], [place], [place] [address]

## 2017-02-18 MED FILL — PROGRAF/1MG/CAP: PROGRAF/1MG/CAP | 30 days supply | Qty: 180 | Fill #2

## 2017-02-18 MED FILL — MYFORTIC/180MG/TAB: MYFORTIC/180MG/TAB | 30 days supply | Qty: 180 | Fill #2

## 2017-02-27 ENCOUNTER — Ambulatory Visit
Admission: RE | Admit: 2017-02-27 | Discharge: 2017-02-27 | Disposition: A | Discharge status: Home / Self Care | Payer: MEDICARE | Attending: Vascular Surgery | Admitting: Vascular Surgery

## 2017-02-27 DIAGNOSIS — E11621 Type 2 diabetes mellitus with foot ulcer: Principal | ICD-10-CM

## 2017-02-27 DIAGNOSIS — L97522 Non-pressure chronic ulcer of other part of left foot with fat layer exposed: Secondary | ICD-10-CM

## 2017-02-27 NOTE — Unmapped (Signed)
Dressing:    Wash area with Antibacterial liquid soap with one wash cloth, clean the soap off with a clean cloth, and then pat dry with a dry cloth before the dressing change.    Continue with you current dressing of iodoform packing, and 4 sided adhesive band-aid      If you have any questions or concerns regarding your wound or wound care, please contact us at the [facility] Wound Healing and Podiatry clinic at [phone].

## 2017-02-28 NOTE — Unmapped (Signed)
Salik Grewell Lavalley is a patient with a wound on the lateral aspect of the left foot associated with DM, and neuropathy. S/p 4th/5th toe amputations and this is at the lateral foot pressure site. He is using a CROW type boot for offloading and is doing daily dressing changes. Reports decrease in drainage, no recent F/C. No other new symptoms    Past Medical History:   Diagnosis Date   ??? Atrial flutter (CMS-HCC)    ??? Diabetes mellitus (CMS-HCC)    ??? Diabetic nephropathy (CMS-HCC)    ??? Diabetic retinopathy (CMS-HCC)    ??? Hyperlipidemia    ??? Hypertension    ??? Osteomyelitis (CMS-HCC) June 2016   ??? Pulmonary embolism (CMS-HCC) Dec 2015   ??? 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 Stroke (CMS-HCC)    ??? Transplanted kidney 08/21/2011     PE:  Vitals:    02/27/17 1434   BP: 131/65   Pulse: 60   Temp: 36.5 ??C (97.7 ??F)     Chest: CTA bilat  Abd: soft, NT, Nd,   Extr: Palpable pedal pulses  No erythema/cellulitis  Wound has moderate overlying callus and non-viable tissue requiring debridement, does not probe to bone.     Wound 07/19/15 left lateral foot scattered (Active)   Wound Status Not Healed 02/27/2017  2:36 PM   Pain 0 02/27/2017  2:36 PM   Dressing Status      Removed 02/27/2017  2:36 PM   Wound Length (cm)      1.5 02/27/2017  2:36 PM   Wound Width (cm)      1.3 02/27/2017  2:36 PM   Wound Depth (cm)      0.3 02/27/2017  2:36 PM   Area (sq cm) 1.95 02/27/2017  2:36 PM   Volume (mL) 0.59 02/27/2017  2:36 PM   Odor None 02/27/2017  2:36 PM   Margins Undefined edges 02/27/2017  2:36 PM   Peri-wound Assessment      Callus 02/27/2017  2:36 PM   Encounter Subsequent 02/27/2017  2:36 PM   Slough % None 02/27/2017  2:36 PM   Eschar % None 02/27/2017  2:36 PM   Epithelialization % None 02/27/2017  2:36 PM   Granulation % 76-100% 02/27/2017  2:36 PM   Exudate Type      Sero-sanguineous 02/27/2017  2:36 PM   Exudate Amnt      Moderate 02/27/2017  2:36 PM   Thickness Full Thickness 02/27/2017  2:36 PM   Exposed Structure N/A 02/27/2017  2:36 PM   Texture Callus 02/27/2017  2:36 PM   Moisture Dry/Scaly 02/27/2017  2:36 PM   Temperature WNL 02/27/2017  2:36 PM   Hypergranuation No 02/27/2017  2:36 PM   Tunneling      No 02/27/2017  2:36 PM   Undermining     No 02/27/2017  2:36 PM   Sinus Tract No 02/27/2017  2:36 PM   Treatments Cleansed/Irrigation 02/27/2017  2:36 PM   Picture Taken Yes 02/27/2017  2:36 PM   Dressing Other (Comment) 02/27/2017  3:11 PM   Length (Pre Debridement) 1.5 cm 02/12/2017 10:27 AM   Width (Pre Debridement) 1 cm 02/12/2017 10:27 AM   Depth (Pre Debridement) 0.2 cm 02/12/2017 10:27 AM   Pre-Procedure Pain 0 02/12/2017 10:27 AM   Time Out Taken Yes 02/12/2017 10:27 AM   Instrument Used scalpel 02/12/2017 10:27 AM   Tissue/Material Removed non-viable;slough  02/12/2017 10:27 AM   Bleeding minimal 02/12/2017 10:27 AM   Bleeding controlled with pressure 02/12/2017 10:27 AM   Specimen Taken none 02/12/2017 10:27 AM   Type of Debridement selective 02/12/2017 10:27 AM   Level of Debridement skin, dermis 02/12/2017 10:27 AM   Procedural Pain 0 02/12/2017 10:27 AM   Length (Post-Debridement) 1.5 cm 02/12/2017 10:27 AM   Width (Post Debridement) 1 cm 02/12/2017 10:27 AM   Depth (Post-Debridement) 0.5 cm 02/12/2017 10:27 AM   Post Procedural Pain 0 02/12/2017 10:27 AM   Area(Pre-Debridement) 1.5 sq cm 02/12/2017 10:27 AM   Area(Post-Debridement) 1.5 sq cm 02/12/2017 10:27 AM   Volume(Pre-Debridement) 0.3 sq cm 02/12/2017 10:27 AM   Volume(Post-Debridement) 0.75 sq cm 02/12/2017 10:27 AM       Wound 08/25/15 Foot Left Bottom Lateral Fissure (Active)       Wound 09/14/16 left lateral foot (Active)       Wounds no longer probe to bone  Based on the necrotic tissue and biofilm present, the decision was made to debride. All Benefits, risks, and alternatives were explained in detail to the patient. A time out was taken prior to the procedure. The area was prepped and draped in standard clinic fashion. Selective debridement was performed to fully debride all devitalized and necrotic tissue down to bleeding granulation tissue with the use of a curette, forceps and scalpel. Topical 4% lidocaine was applied 10 minutes prior to the debridement and the patient tolerated this well. Hemostasis was achieved with pressure and the measurements did not change post-operatively..    Impression wounds as above  No evidence of OM on films from 9/11    Plan:  1. We will continue packing with iodoform gauze  2.Continue offloading  3. RTC 2-3 weeks

## 2017-03-01 ENCOUNTER — Ambulatory Visit
Admission: RE | Admit: 2017-03-01 | Discharge: 2017-03-01 | Disposition: A | Discharge status: Home / Self Care | Payer: MEDICARE

## 2017-03-01 DIAGNOSIS — Z940 Kidney transplant status: Principal | ICD-10-CM

## 2017-03-01 LAB — COMPREHENSIVE METABOLIC PANEL
ALKALINE PHOSPHATASE: 75 U/L (ref 38–126)
ANION GAP: 15 mmol/L (ref 9–15)
BILIRUBIN TOTAL: 0.9 mg/dL (ref 0.0–1.2)
BLOOD UREA NITROGEN: 44 mg/dL — ABNORMAL HIGH (ref 7–21)
BUN / CREAT RATIO: 27
CALCIUM: 9.2 mg/dL (ref 8.5–10.2)
CHLORIDE: 98 mmol/L (ref 98–107)
CO2: 25 mmol/L (ref 22.0–30.0)
CREATININE: 1.63 mg/dL — ABNORMAL HIGH (ref 0.70–1.30)
EGFR MDRD AF AMER: 51 mL/min/{1.73_m2} — ABNORMAL LOW (ref >=60)
EGFR MDRD NON AF AMER: 42 mL/min/{1.73_m2} — ABNORMAL LOW (ref >=60)
GLUCOSE RANDOM: 335 mg/dL — ABNORMAL HIGH (ref 65–179)
POTASSIUM: 4.6 mmol/L (ref 3.5–5.0)
PROTEIN TOTAL: 6.6 g/dL (ref 6.5–8.3)
SODIUM: 138 mmol/L (ref 135–145)

## 2017-03-01 LAB — PHOSPHORUS: Phosphate:MCnc:Pt:Ser/Plas:Qn:: 3.1

## 2017-03-01 LAB — CBC W/ AUTO DIFF
EOSINOPHILS ABSOLUTE COUNT: 0.1 10*9/L (ref 0.0–0.4)
HEMOGLOBIN: 14.8 g/dL (ref 13.5–17.5)
LARGE UNSTAINED CELLS: 2 % (ref 0–4)
LYMPHOCYTES ABSOLUTE COUNT: 0.9 10*9/L — ABNORMAL LOW (ref 1.5–5.0)
MEAN CORPUSCULAR HEMOGLOBIN CONC: 33.4 g/dL (ref 31.0–37.0)
MEAN CORPUSCULAR HEMOGLOBIN: 28.7 pg (ref 26.0–34.0)
MEAN CORPUSCULAR VOLUME: 85.9 fL (ref 80.0–100.0)
MEAN PLATELET VOLUME: 9 fL (ref 7.0–10.0)
MONOCYTES ABSOLUTE COUNT: 0.5 10*9/L (ref 0.2–0.8)
NEUTROPHILS ABSOLUTE COUNT: 5 10*9/L (ref 2.0–7.5)
PLATELET COUNT: 237 10*9/L (ref 150–440)
RED BLOOD CELL COUNT: 5.16 10*12/L (ref 4.50–5.90)
RED CELL DISTRIBUTION WIDTH: 14.2 % (ref 12.0–15.0)
WBC ADJUSTED: 6.7 10*9/L (ref 4.5–11.0)

## 2017-03-01 LAB — PROTIME: Lab: 18.9 — ABNORMAL HIGH

## 2017-03-01 LAB — NEUTROPHILS ABSOLUTE COUNT: Lab: 5

## 2017-03-01 LAB — ALKALINE PHOSPHATASE: Alkaline phosphatase:CCnc:Pt:Ser/Plas:Qn:: 75

## 2017-03-01 NOTE — Unmapped (Signed)
PCP:  [name], MD    12/29/2016      ASSESSMENT/PLAN:  [name] is a 71 y.o. year old patient s/p LURD kidney transplant 2013 with postoperative complications of a massive PE in 2014 and osteomyelitis requiring 4th/5th toe amputations in 2016.     1. CKD stage 3: Renal functionat his baseline and his electrolytes are WNL. Urine dipstick negative for proteinuria and most recent UACR was within normal range. Continue ARB at low dose. Could consider maximizing at some point but doing well so will leave alone.    2. S/P LURD kidney transplant 2013.  Has had 3 prior biopsies all in 2013 revealing moderate to severe arteriosclerosis.  The patient has a history of a DQ 7 donor specific antibody noted on 09/17/2011 with all subsequent HLA antibody screens revealing no DSA's.  Continue current dose of mycophenolate and tacrolimus (goal trough of 4-7).    3. HTN. BP within goal.    4. T2DM. Uncontrolled. Discussed the importance of this to retain kidney function and eyesight. Goal HbA1c < 8%.    5. Hyperlipidemia. Controlled. Continue pravastatin.     6. Need for chronic anticoagulation. Completed treatment for PE but continues on coumadin for paroxysmal AFib. We'd discussed transition to eliquis but given lack of reversal agent, it was decided to continue the warfarin since he's been very stable on it.     7. HM: Had his flu shot and UTD on pneumonia vaccine. Due for colonoscopy and reminded him of this. Continue follow up with dermatology re: recurrent basal cell CA.    DavidOnnie A Dolinski will return to see me in 3mos.    ______________________________________________________________________    HPI:  David Ellis is an 71 y.o. year old man s/p LURD kidney transplant (spouse) 08/21/2011 and B/L creatinine ~1.4-1.7 who returns for f/u. 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 He has significant PAD and neuropathy and has no sensation on either leg below his knees.     He  feels well and denies F/C, CP, SOB, cough, palpitations, N/V/D, hematuria/dysuria/frothy urine. Tacrolimus troughs have been 5-7 which is his target range. Having worsening blood sugars with A1c in August of 9.3% and working with Dr. Tiburcio Pea in Endo to increase his insulin. He continues on warfarin and has this tightly monitored by Dr. Dellis Filbert. He had a right vitrectomy in August and left one this month and he's doing well. Continues to see wound clinic for left foot wound and still using offloading boot. He denies F/C, CP, SOB, N/V/D, hematuria/dysuria or changes in LE edema. He's frustrated with having so many medical issues but continues to have a great attitude and sense of humor.     ROS: As per HPI. The remainder of the 10 system review is negative.    PAST MEDICAL HISTORY:  Past Medical History:   Diagnosis Date   ??? Atrial flutter (CMS-HCC)    ??? Diabetes mellitus (CMS-HCC)    ??? Diabetic nephropathy (CMS-HCC)    ??? Diabetic retinopathy (CMS-HCC)    ??? Hyperlipidemia    ??? Hypertension    ??? Osteomyelitis (CMS-HCC) June 2016   ??? Pulmonary embolism (CMS-HCC) Dec 2015   ??? Retinopathy due to secondary diabetes (  CMS-HCC) Aug 2014   ??? 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 Stroke (CMS-HCC)    ??? Transplanted kidney 08/21/2011       ALLERGIES  Enalapril; Grass pollen-bermuda, standard; Levofloxacin; Lidocaine; Penicillins; Egg derived; Epinephrine; Lisinopril; and Mepivacaine hcl    MEDICATIONS:  Current Outpatient Prescriptions   Medication Sig Dispense Refill   ??? amLODIPine (NORVASC) 5 MG tablet Take 5 mg by mouth daily.     ??? ammonium lactate (LAC-HYDRIN) 12 % lotion Apply 1 application topically Two (2) times a day. 400 g 3   ??? aspirin (ECOTRIN) 81 MG tablet Take 81 mg by mouth continuous as needed. Only when flying and/or traveling by car more than 2 hours.     ??? BD INSULIN SYRINGE ULTRA-FINE 0.3 mL 31 gauge x 15/64 Syrg 1 each by Other route Three (3) times a day. 300 Syringe 3   ??? BD ULTRA-FINE NANO PEN NEEDLE 32 gauge x 5/32 Ndle USE UP TO 3 TIMES DAILY 100 each 11   ??? blood sugar diagnostic (FREESTYLE PRECISION NEO STRIPS) Strp Use to test blood sugar 4 times daily. Dx E11.65. 150 each 5   ??? blood-glucose meter kit Use as instructed. 1 each 0   ??? chlorthalidone (HYGROTON) 25 MG tablet Take 1 tablet (25 mg total) by mouth daily. 90 tablet 3   ??? cholecalciferol, vitamin D3, (VITAMIN D3) 2,000 unit Tab Take 2,000 Units by mouth daily.      ??? furosemide (LASIX) 40 MG tablet Take 1-2 tablets once daily 180 tablet 3   ??? gabapentin (NEURONTIN) 300 MG capsule TAKE 1 CAPSULE TWICE DAILY 180 capsule 3   ??? glucagon, human recombinant, (GLUCAGON) 1 mg injection Follow package directions for low blood sugar. 1 mg 1   ??? HUMULIN R U-500, CONC, KWIKPEN 500 unit/mL (3 mL) CONCENTRATED injection DIAL TO MAX 100 UNITS BEFORE [facility] AND DINNER.INJECT 30 MINS BEFORE MEALS.MAX OF 300 UNITS DAILY 18 mL 11   ??? hydrocortisone 2.5 % cream   0   ??? irbesartan (AVAPRO) 150 MG tablet Take 1 tablet (150 mg total) by mouth nightly. 90 tablet 3   ??? ketoconazole (NIZORAL) 2 % cream   0   ??? lancets Misc 1 each by Miscellaneous route Four (4) times a day. 150 each 6   ??? magnesium chloride (SLOW_MAG) 64 mg TbEC Take 128 mg by mouth three (3) times a day (at 6am, noon and 6pm).      ??? metoprolol succinate (TOPROL-XL) 200 MG 24 hr tablet 200 mg by mouth twice daily 180 tablet 3   ??? multivitamin capsule Take 1 capsule by mouth daily.     ??? mycophenolate (MYFORTIC) 180 MG EC tablet Take 3 tablets (540 mg total) by mouth Two (2) times a day. 540 tablet 3   ??? pravastatin (PRAVACHOL) 20 MG tablet Take 1 tablet (20 mg total) by mouth daily. 30 tablet 11   ??? silver sulfaDIAZINE (SILVADENE) 1 % cream Apply topically daily. 50 g 0   ??? tacrolimus (PROGRAF) 1 MG capsule Take 3 capsules (3 mg total) by mouth Two (2) times a day. Z94.0 540 capsule 3   ??? warfarin (COUMADIN) 2.5 MG tablet Take 1 tablet (2.5 mg total) by mouth Every Monday. 30 tablet 2   ??? warfarin (COUMADIN) 5 MG tablet Take 5 mg every day except mondays 30 tablet 6     No current facility-administered medications for this visit.      Facility-Administered Medications Ordered in  Other Visits   Medication Dose Route Frequency Provider Last Rate Last Dose   ??? bevacizumab    PRN (once a day) Melvyn Novas, MD   2.5 mg at 01/28/17 0750         PHYSICAL EXAM:  BP 126/72 HR 72 AF  CONSTITUTIONAL: Alert,well appearing, no distress  CARDIOVASCULAR: Regular, normal S1/S2 heart sounds  PULM: Clear to auscultation bilaterally  EXTREMITIES:  1+ B/L LE edema (L>R chronic)    MEDICAL DECISION MAKING    Results for orders placed or performed in visit on 03/01/17   POCT Urinalysis Dipstick   Result Value Ref Range    Spec Gravity/POC 1.010 1.003 - 1.030    PH/POC 5.5 5.0 - 9.0    Leuk Esterase/POC Negative Negative    Nitrite/POC Negative Negative    Protein/POC Negative Negative    UA Glucose/POC 2+ (A) Negative    Ketones, POC Negative Negative    Bilirubin/POC Negative Negative    Blood/POC Negative Negative    Urobilinogen/POC 0.2 0.2 - 1.0 mg/dL     *Note: Due to a large number of results and/or encounters for the requested time period, some results have not been displayed. A complete set of results can be found in Results Review.        Lab Results   Component Value Date    NA 138 03/01/2017    K 4.6 03/01/2017    CL 98 03/01/2017    CO2 25.0 03/01/2017    BUN 44 (H) 03/01/2017    CREATININE 1.63 (H) 03/01/2017    GFRAA 51 (L) 03/01/2017    GFRNONAA 42 (L) 03/01/2017    ALBUMIN 3.8 03/01/2017     Lab Results   Component Value Date    WBC 6.7 03/01/2017    HGB 14.8 03/01/2017    HCT 44.3 03/01/2017    PLT 237 03/01/2017 Lab Results   Component Value Date    PTH 88.2 (H) 08/08/2016    CALCIUM 9.2 03/01/2017    PHOS 3.1 03/01/2017

## 2017-03-01 NOTE — Unmapped (Signed)
Urine sample collected

## 2017-03-01 NOTE — Unmapped (Signed)
Nice pickup.  Sorry.  Incresae to 5 mg daily except Wed, 2.5.    Repeat INR one week.  [name], M.D.     [facility] Internal Medicine at [facility]   [address]  [address]  [place], [place]  [address]  [phone]

## 2017-03-01 NOTE — Unmapped (Signed)
INR low.  INcrease to 2.5/5 mg alternating days.  REpeat INR 1-2 weeks.    [name], M.D.     [facility] Internal Medicine at [facility]   [address]  [address]  [place], [place]  [address]  [phone]

## 2017-03-01 NOTE — Unmapped (Signed)
-----   Message from Sharlee Blew, MD sent at 03/01/2017  2:46 PM EDT -----  INR low.  What is current coumadin doing?    Other labs stable.    [name], M.D.     [facility] Internal Medicine at [facility]   [address]  [address]  [place], [place]  [address]  [phone]

## 2017-03-01 NOTE — Unmapped (Signed)
Patient notified of new dosing instructions, voiced understanding.

## 2017-03-01 NOTE — Unmapped (Signed)
Patient notified of coumadin dosage change and voiced understanding.  Coumadin clinic appt scheduled for Oct 10.

## 2017-03-01 NOTE — Unmapped (Signed)
Patient states that he has been taking 2.5 mg on Mondays and Thursdays, and 5 mg all other days.  Please advise.

## 2017-03-01 NOTE — Unmapped (Signed)
That dosing is actually a decrease , please advise.

## 2017-03-02 LAB — TACROLIMUS BLOOD: Lab: 7.3

## 2017-03-04 LAB — VITAMIN D, TOTAL (25OH): Lab: 33.8

## 2017-03-07 LAB — VITAMIN D 1,25 DIHYDROXY: VITAMIN D 1,25-DIHYDROXY: 44 pg/mL

## 2017-03-07 LAB — VITAMIN D 1,25-DIHYDROXY: 1,25-Dihydroxyvitamin D:MCnc:Pt:Ser/Plas:Qn:: 44

## 2017-03-12 NOTE — Unmapped (Signed)
[facility] Refill and Clinical Coordination Note  Medication(s): PROGRAF 1MG  AND MYFORTIC 180MG     David Ellis, DOB: [birth year]  Phone: [phone] (home) [phone] (work), Alternate phone contact: N/A  Shipping address: [name]  [place] [address]  Phone or address changes today?: No  All above HIPAA information verified.  Insurance changes? No    Completed refill and clinical call assessment today to schedule patient's medication shipment from the [facility] [phone]).      MEDICATION RECONCILIATION    Confirmed the medication and dosage are correct and have not changed: Yes, regimen is correct and unchanged.    Were there any changes to your medication(s) in the past month:  No, there are no changes reported at this time.    ADHERENCE    Is this medicine transplant or covered by Medicare Part B? Yes.    Prograf 1 mg   Quantity filled last month: 180   # of tablets left on hand: 84      Myfortic 180 mg   Quantity filled last month: 180   # of tablets left on hand: 84    Did you miss any doses in the past 4 weeks? No missed doses reported.  Adherence counseling provided? Not needed     SIDE EFFECT MANAGEMENT    Are you tolerating your medication?:  David Ellis reports tolerating the medication.  Side effect management discussed: None      Therapy is appropriate and should be continued.    Evidence of clinical benefit: See Epic note from 03/01/17      FINANCIAL/SHIPPING    Delivery Scheduled: Yes, Expected medication delivery date: 03/20/17   Additional medications refilled: No additional medications/refills needed at this time.    David Ellis did not have any additional questions at this time.    Delivery address validated in [facility] scheduling system: Yes, address listed above is correct.      We will follow up with patient monthly for standard refill processing and delivery.      Thank you,  Marletta Lor   [facility] Specialty Pharmacist

## 2017-03-13 ENCOUNTER — Ambulatory Visit: Admission: RE | Admit: 2017-03-13 | Discharge: 2017-03-13 | Payer: MEDICARE

## 2017-03-13 DIAGNOSIS — I482 Chronic atrial fibrillation, unspecified: Principal | ICD-10-CM

## 2017-03-13 NOTE — Unmapped (Signed)
Patient came in today for POC INR visit which read 2.8. Patient is currently taking 2.5 mg on Wednesdays, 5 mg all other days for a weekly total of 32.5 mg. Denies any missed doses, diet changes, bleeding, or new medications.     Last INR was 1.66 on 03/01/17. At that time we advised the patient to increase weekly coumadin dose from 30 mg to 32.5 mg.    Patient advised to continue current dose and recheck INR in 2 weeks per standing order.

## 2017-03-18 NOTE — Unmapped (Signed)
I called patient because as of today we have not received any forms form [employer]. He needs to contact [employer] and have them fax [place] the form to [phone] or [phone]    I'm also sending message via my chart

## 2017-03-18 NOTE — Unmapped (Signed)
-----   Message from Donzetta Kohut, MD sent at 03/18/2017  2:22 PM EDT -----  Regarding: form from [employer]  Pt contacted me via Text Fri PM.   I have looked in EPIC and see no prior mychart messages, etc, so not sure why he resulted to text as 1st notification, but I am not going to respond to his text.    His request:  Felisa Bonier called him to tell him they are holding his libre flash until we contact them w chart notes.   They need last office visit, T1vsT2DM, how many times check daily, and sig/date.  [employer] fax: [phone].     I will make sure my last clinic note has the above information, but could you contact pt and/or [employer] to get appropriate paperwork moving.     Thx, beth

## 2017-03-19 MED FILL — MYFORTIC/180MG/TAB: MYFORTIC/180MG/TAB | 30 days supply | Qty: 180 | Fill #3

## 2017-03-19 MED FILL — PROGRAF/1MG/CAP: PROGRAF/1MG/CAP | 30 days supply | Qty: 180 | Fill #3

## 2017-03-21 ENCOUNTER — Ambulatory Visit
Admission: RE | Admit: 2017-03-21 | Discharge: 2017-03-21 | Disposition: A | Discharge status: Home / Self Care | Attending: Vascular Surgery | Admitting: Vascular Surgery

## 2017-03-21 DIAGNOSIS — I739 Peripheral vascular disease, unspecified: Secondary | ICD-10-CM

## 2017-03-21 DIAGNOSIS — L97522 Non-pressure chronic ulcer of other part of left foot with fat layer exposed: Secondary | ICD-10-CM

## 2017-03-21 DIAGNOSIS — E11621 Type 2 diabetes mellitus with foot ulcer: Principal | ICD-10-CM

## 2017-03-21 NOTE — Unmapped (Signed)
BP 128/60  - Pulse 59  - Temp 36.5 ??C (97.7 ??F) (Oral)     Wound 07/19/15 left lateral foot scattered (Active)   Wound Status Not Healed 03/21/2017  8:19 AM   Pain 3 03/21/2017  8:19 AM   Dressing Status      Removed 03/21/2017  8:19 AM   Wound Length (cm)      0.1 03/21/2017  8:19 AM   Wound Width (cm)      0.1 03/21/2017  8:19 AM   Wound Depth (cm)      0.1 03/21/2017  8:19 AM   Area (sq cm) 0.01 03/21/2017  8:19 AM   Volume (mL) 0 03/21/2017  8:19 AM   Odor None 03/21/2017  8:19 AM   Margins Undefined edges 03/21/2017  8:19 AM   Peri-wound Assessment      Callus 03/21/2017  8:19 AM   Encounter Subsequent 03/21/2017  8:19 AM   Slough % None 03/21/2017  8:19 AM   Eschar % None 03/21/2017  8:19 AM   Epithelialization % None 03/21/2017  8:19 AM   Granulation % None 03/21/2017  8:19 AM   Exudate Type      Sero-sanguineous 03/21/2017  8:19 AM   Exudate Amnt      Scant 03/21/2017  8:19 AM   Thickness Partial Thickness 03/21/2017  8:19 AM   Exposed Structure N/A 03/21/2017  8:19 AM   Texture Callus 03/21/2017  8:19 AM   Moisture Dry/Scaly 03/21/2017  8:19 AM   Temperature WNL 03/21/2017  8:19 AM   Hypergranuation No 03/21/2017  8:19 AM   Tunneling      No 03/21/2017  8:19 AM   Undermining     No 03/21/2017  8:19 AM   Sinus Tract No 03/21/2017  8:19 AM   Treatments Cleansed/Irrigation 03/21/2017  8:19 AM   Picture Taken Yes 03/21/2017  8:19 AM   Dressing Other (Comment) 03/21/2017  8:57 AM   Length (Pre Debridement) 0.1 cm 03/21/2017  8:57 AM   Width (Pre Debridement) 0.1 cm 03/21/2017  8:57 AM   Depth (Pre Debridement) 0.1 cm 03/21/2017  8:57 AM   Pre-Procedure Pain 0 03/21/2017  8:57 AM   Time Out Taken Yes 03/21/2017  8:57 AM   Instrument Used scalpel;forceps 03/21/2017  8:57 AM   Tissue/Material Removed non-viable;callus 03/21/2017  8:57 AM   Bleeding minimal 03/21/2017  8:57 AM   Bleeding controlled with pressure 03/21/2017  8:57 AM   Specimen Taken none 03/21/2017  8:57 AM   Type of Debridement selective 03/21/2017 8:57 AM   Level of Debridement skin, dermis 03/21/2017  8:57 AM   Procedural Pain 0 03/21/2017  8:57 AM   Length (Post-Debridement) 0.2 cm 03/21/2017  8:57 AM   Width (Post Debridement) 0.2 cm 03/21/2017  8:57 AM   Depth (Post-Debridement) 0.2 cm 03/21/2017  8:57 AM   Post Procedural Pain 0 03/21/2017  8:57 AM   Area(Pre-Debridement) 0.01 sq cm 03/21/2017  8:57 AM   Area(Post-Debridement) 0.04 sq cm 03/21/2017  8:57 AM   Volume(Pre-Debridement) 0 sq cm 03/21/2017  8:57 AM   Volume(Post-Debridement) 0.01 sq cm 03/21/2017  8:57 AM       Wound 08/25/15 Foot Left Bottom Lateral Fissure (Active)       Wound 09/14/16 left lateral foot (Active)     Based on the necrotic tissue and biofilm present, the decision was made to debride. All Benefits, risks, and alternatives were explained in detail to the patient. A time out  was taken prior to the procedure. The area was prepped and draped in standard clinic fashion. Selective debridement was performed to fully debride all devitalized and necrotic tissue down to bleeding granulation tissue with the use of a curette, forceps and scalpel. Topical 4% lidocaine was applied 10 minutes prior to the debridement and the patient tolerated this well. Hemostasis was achieved with pressure and the measurements did not change post-operatively.  This is a patient who is had previous amputations on the lateral portion of the left foot including toes and metatarsal. He has at the amputation site a small ulceration.    On physical exam the patient's awake alert no acute distress. He is able to communicate without difficulty    Vital signs were reviewed and the patient's afebrile    There is a callus present in the lateral portion of the left foot. This was removed with the use of forceps and a #10 blade. There is a 3 mm in depth wound under the callus.    Impression wound is described above    Plan we will have him pack the wound with iodoform gauze see him back in clinic in 2-3 weeks.

## 2017-03-21 NOTE — Unmapped (Addendum)
Dressing:    Wash area with Antibacterial liquid soap with one wash cloth, clean the soap off with a clean cloth, and then pat dry with a dry cloth before the dressing change.    Continue with you current dressing of iodoform packing, and 4 sided adhesive band-aid. Change daily      If you have any questions or concerns regarding your wound or wound care, please contact us at the [facility] Wound Healing and Podiatry clinic at [phone].        Wear your DH2 shoe

## 2017-03-28 ENCOUNTER — Ambulatory Visit
Admission: RE | Admit: 2017-03-28 | Discharge: 2017-03-28 | Disposition: A | Discharge status: Home / Self Care | Payer: MEDICARE

## 2017-03-28 DIAGNOSIS — I4891 Unspecified atrial fibrillation: Secondary | ICD-10-CM

## 2017-03-28 DIAGNOSIS — Z940 Kidney transplant status: Principal | ICD-10-CM

## 2017-03-28 LAB — COMPREHENSIVE METABOLIC PANEL
ALBUMIN: 3.8 g/dL (ref 3.5–5.0)
ALKALINE PHOSPHATASE: 78 U/L (ref 38–126)
ALT (SGPT): 44 U/L (ref 19–72)
ANION GAP: 12 mmol/L (ref 9–15)
AST (SGOT): 28 U/L (ref 19–55)
BILIRUBIN TOTAL: 0.9 mg/dL (ref 0.0–1.2)
BLOOD UREA NITROGEN: 49 mg/dL — ABNORMAL HIGH (ref 7–21)
BUN / CREAT RATIO: 29
CALCIUM: 9.2 mg/dL (ref 8.5–10.2)
CHLORIDE: 96 mmol/L — ABNORMAL LOW (ref 98–107)
CO2: 29 mmol/L (ref 22.0–30.0)
CREATININE: 1.71 mg/dL — ABNORMAL HIGH (ref 0.70–1.30)
EGFR MDRD AF AMER: 48 mL/min/{1.73_m2} — ABNORMAL LOW (ref >=60)
EGFR MDRD NON AF AMER: 40 mL/min/{1.73_m2} — ABNORMAL LOW (ref >=60)
POTASSIUM: 4.8 mmol/L (ref 3.5–5.0)
SODIUM: 137 mmol/L (ref 135–145)

## 2017-03-28 LAB — CBC W/ AUTO DIFF
BASOPHILS ABSOLUTE COUNT: 0 10*9/L (ref 0.0–0.1)
HEMATOCRIT: 44.3 % (ref 41.0–53.0)
HEMOGLOBIN: 14.4 g/dL (ref 13.5–17.5)
LARGE UNSTAINED CELLS: 2 % (ref 0–4)
LYMPHOCYTES ABSOLUTE COUNT: 1.2 10*9/L — ABNORMAL LOW (ref 1.5–5.0)
MEAN CORPUSCULAR HEMOGLOBIN CONC: 32.6 g/dL (ref 31.0–37.0)
MEAN CORPUSCULAR HEMOGLOBIN: 28.4 pg (ref 26.0–34.0)
MEAN CORPUSCULAR VOLUME: 87 fL (ref 80.0–100.0)
MONOCYTES ABSOLUTE COUNT: 0.4 10*9/L (ref 0.2–0.8)
NEUTROPHILS ABSOLUTE COUNT: 5 10*9/L (ref 2.0–7.5)
RED BLOOD CELL COUNT: 5.09 10*12/L (ref 4.50–5.90)
RED CELL DISTRIBUTION WIDTH: 13.9 % (ref 12.0–15.0)
WBC ADJUSTED: 6.8 10*9/L (ref 4.5–11.0)

## 2017-03-28 LAB — PROTIME: Lab: 21 — ABNORMAL HIGH

## 2017-03-28 LAB — EGFR MDRD NON AF AMER
Glomerular filtration rate/1.73 sq M.predicted.non black:ArVRat:Pt:Ser/Plas/Bld:Qn:Creatinine-based formula (MDRD): 40 — ABNORMAL LOW

## 2017-03-28 LAB — TACROLIMUS BLOOD: Lab: 6.9

## 2017-03-28 LAB — EOSINOPHILS ABSOLUTE COUNT: Lab: 0.1

## 2017-03-28 LAB — PHOSPHORUS: Phosphate:MCnc:Pt:Ser/Plas:Qn:: 4.3

## 2017-03-29 NOTE — Unmapped (Signed)
Increase to 5 mg daily.    REpeat INR one to two weeks.    [name], M.D.     [facility] Internal Medicine at [facility]   [address]  [address]  [place], [place]  [address]  [phone]

## 2017-03-29 NOTE — Unmapped (Signed)
-----   Message from Sharlee Blew, MD sent at 03/29/2017  7:49 AM EDT -----  PT sl low.  WHat is current coumadin dosing?    [name], M.D.     [facility] Internal Medicine at [facility]   [address]  [address]  [place], [place]  [address]  [phone]

## 2017-03-29 NOTE — Unmapped (Signed)
INR was 1.84 on 10/25, previous INR was 2.8 on 10/10.  Current dosing is 2.5mg  on Wednesdays and 5mg  all other days.

## 2017-04-01 NOTE — Unmapped (Signed)
Relayed new dosing to patient. Patient's wife will call and schedule Coumadin Clinic visit for 1-2 weeks.

## 2017-04-10 ENCOUNTER — Ambulatory Visit
Admission: RE | Admit: 2017-04-10 | Discharge: 2017-04-10 | Disposition: A | Discharge status: Home / Self Care | Attending: Physician Assistant

## 2017-04-10 DIAGNOSIS — L97522 Non-pressure chronic ulcer of other part of left foot with fat layer exposed: Secondary | ICD-10-CM

## 2017-04-10 DIAGNOSIS — E11621 Type 2 diabetes mellitus with foot ulcer: Principal | ICD-10-CM

## 2017-04-10 DIAGNOSIS — I739 Peripheral vascular disease, unspecified: Secondary | ICD-10-CM

## 2017-04-11 NOTE — Unmapped (Signed)
Dressing:    Wash area with Antibacterial liquid soap with one wash cloth, clean the soap off with a clean cloth, and then pat dry with a dry cloth before the dressing change.    Apply Iodoflex to the wound bed, and 4 sided adhesive band-aid. Change daily      If you have any questions or concerns regarding your wound or wound care, please contact us at the [facility] Wound Healing and Podiatry clinic at [phone].        Wear your DH2 shoe

## 2017-04-15 ENCOUNTER — Ambulatory Visit: Admission: RE | Admit: 2017-04-15 | Discharge: 2017-04-15 | Payer: MEDICARE

## 2017-04-15 DIAGNOSIS — I4891 Unspecified atrial fibrillation: Principal | ICD-10-CM

## 2017-04-15 NOTE — Unmapped (Signed)
[facility] Specialty Pharmacy Refill Coordination Note  Specialty Medication(s): Myfortic 180mg  & Prograf 1mg      David Ellis, DOB: [birth year]  Phone: [phone] (home) [phone] (work), Alternate phone contact: N/A  Phone or address changes today?: No  All above HIPAA information was verified with patient.  Shipping Address: [address]  [place] [address]   Insurance changes? No    Completed refill call assessment today to schedule patient's medication shipment from the [facility] [phone]).      Confirmed the medication and dosage are correct and have not changed: Yes, regimen is correct and unchanged.    Confirmed patient started or stopped the following medications in the past month:  No, there are no changes reported at this time.    Are you tolerating your medication?:  David Ellis reports tolerating the medication.    ADHERENCE    (Below is required for Medicare Part B or Transplant patients only - per drug):   How many tablets were dispensed last month:   Myfortic 180 mg   Quantity filled last month: 180   # of tablets left on hand: 10 days  Prograf 1 mg   Quantity filled last month: 180   # of tablets left on hand: 10 days    Did you miss any doses in the past 4 weeks? No missed doses reported.    FINANCIAL/SHIPPING    Delivery Scheduled: Yes, Expected medication delivery date: 04/24/2017     David Ellis did not have any additional questions at this time.    Delivery address validated in FSI scheduling system: Yes, address listed in FSI is correct.    We will follow up with patient monthly for standard refill processing and delivery.      Thank you,  Tamala Fothergill   [facility] Specialty Technician

## 2017-04-15 NOTE — Unmapped (Signed)
Patient came in today for POC INR visit which read 2.9. Patient is currently taking  5 mg per day. Denies any missed doses, diet changes, bleeding, or new medications.     Last INR was 1.84 on 03/28/17. At that time we changed dosage of medication back to 5 mg daily.    Patient advised to continue current dose and recheck INR in 2 weeks per standing order.

## 2017-04-23 MED FILL — MYFORTIC/180MG/TAB: MYFORTIC/180MG/TAB | 30 days supply | Qty: 180 | Fill #4

## 2017-04-23 MED FILL — PROGRAF/1MG/CAP: PROGRAF/1MG/CAP | 30 days supply | Qty: 180 | Fill #4

## 2017-04-26 NOTE — Unmapped (Signed)
Debridement  Date/Time: 04/10/2017 1:29 PM  Performed by: Barbette Or  Authorized by: Lattie Corns SUE   Preparation: Patient was prepped and draped in the usual sterile fashion.  Local anesthesia used: yes    Anesthesia:  Local anesthesia used: yes  Local Anesthetic: topical anesthetic    Sedation:  Patient sedated: no  Patient tolerance: Patient tolerated the procedure well with no immediate complications  Comments: Based on the necrotic tissue and biofilm present, the decision was made to debride. All Benefits, risks, and alternatives were explained in detail to the patient. A time out was taken prior to the procedure. The area was prepped and draped in standard clinic fashion. Selective debridement was performed to fully debride all devitalized and necrotic tissue down to bleeding granulation tissue with the use of a curette, forceps and scalpel. Topical 4% lidocaine was applied 10 minutes prior to the debridement and the patient tolerated this well. Hemostasis was achieved with pressure and the measurements did not change post-operatively.            Patient Active Problem List   Diagnosis   ??? Type II diabetes mellitus with renal manifestations, uncontrolled (CMS-HCC)   ??? Hypertension   ??? History of kidney transplant   ??? Mixed hyperlipidemia   ??? 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 Transplanted kidney   ??? Pulmonary embolism (CMS-HCC)   ??? Edema   ??? Right ventricular dysfunction   ??? Atrial fibrillation (CMS-HCC)   ??? Aftercare following organ transplant   ??? Diverticulosis of colon   ??? Routine general medical examination at a health care facility   ??? Diabetic polyneuropathy associated with type 2 diabetes mellitus (CMS-HCC)   ??? Diabetic ulcer of left foot associated with type 2 diabetes mellitus (CMS-HCC)   ??? Toe amputation status (CMS-HCC)   ??? On statin therapy due to risk of future cardiovascular event   ??? Left knee pain   ??? BCC (basal cell carcinoma), face   ??? Critical lower limb ischemia   ??? Acquired absence of other toe(s), unspecified side (CMS-HCC)   ??? History of colonic polyps   ??? Non-pressure chronic ulcer of other part of unspecified foot with unspecified severity (CMS-HCC)   ??? 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 Hypoglycemia associated with type 2 diabetes mellitus (CMS-HCC)   ??? PAD (peripheral artery disease) (CMS-HCC)       David Ellis is a 71 year old gentleman with the above medical problems who returns for routine follow-up of his left lateral diabetic foot ulcer.  He has underlying PAD with runoff disease and a toe pressure of 92 mmHg.  His last x-ray was in September showing no evidence of osteomyelitis.  His last PVL was in July showing runoff disease but a normal toe pressure.  The area continues to callus over with no resolution of the ulceration.  He has had no fevers or chills or night sweats or any redness or swelling around the ulcerative area.  He has had the fourth and fifth toe amputated as well as partial metatarsal resection.  The ulcer is over the lateral foot at the amputation site.    Physical exam    General: 71 year old gentleman in no apparent distress.    Blood pressure 149/61, pulse 55, temperature 36.6 ??C (97.8 ??F), temperature source Oral, height 182.9 cm (6' 0.01), weight (!) 122.8 kg (270 lb 11.6 oz).    Wound 07/19/15 left lateral foot scattered (Active)  Wound Status Not Healed 04/10/2017  3:32 PM   Pain 0 04/10/2017  3:32 PM   Dressing Status      Removed 04/10/2017  3:32 PM   Wound Length (cm)      0.2 04/10/2017  3:32 PM   Wound Width (cm)      0.6 04/10/2017  3:32 PM   Wound Depth (cm)      0.6 04/10/2017  3:32 PM   Area (sq cm) 0.12 04/10/2017  3:32 PM   Volume (mL) 0.07 04/10/2017  3:32 PM   Odor None 04/10/2017  3:32 PM   Margins Undefined edges 04/10/2017  3:32 PM   Peri-wound Assessment      Callus 04/10/2017  3:32 PM   Encounter Subsequent 04/10/2017  3:32 PM   Slough % 26-50% 04/10/2017  3:32 PM   Eschar % None 03/21/2017  8:19 AM   Epithelialization % None 03/21/2017  8:19 AM   Granulation % 26-50% 04/10/2017  3:32 PM   Exudate Type      Sero-sanguineous 04/10/2017  3:32 PM   Exudate Amnt      Small 04/10/2017  3:32 PM   Thickness Full Thickness 04/10/2017  3:32 PM   Exposed Structure N/A 03/21/2017  8:19 AM   Texture Callus 04/10/2017  3:32 PM   Moisture Normal For Patient 04/10/2017  3:32 PM   Temperature WNL 04/10/2017  3:32 PM   Hypergranuation No 03/21/2017  8:19 AM   Tunneling      No 03/21/2017  8:19 AM   Undermining     No 03/21/2017  8:19 AM   Sinus Tract No 03/21/2017  8:19 AM   Treatments Cleansed/Irrigation 04/10/2017  3:32 PM   Picture Taken Yes 04/10/2017  3:32 PM   Dressing Other (Comment) 03/21/2017  8:57 AM   Length (Pre Debridement) 0.2 cm 04/10/2017  3:48 PM   Width (Pre Debridement) 0.6 cm 04/10/2017  3:48 PM   Depth (Pre Debridement) 0.6 cm 04/10/2017  3:48 PM   Pre-Procedure Pain 0 04/10/2017  3:48 PM   Time Out Taken Yes 04/10/2017  3:48 PM   Instrument Used scalpel 04/10/2017  3:48 PM   Tissue/Material Removed callus;non-viable 04/10/2017  3:48 PM   Bleeding minimal 04/10/2017  3:48 PM   Bleeding controlled with pressure 04/10/2017  3:48 PM   Specimen Taken none 04/10/2017  3:48 PM   Type of Debridement selective 04/10/2017  3:48 PM   Level of Debridement subcutaneous tissue 04/10/2017  3:48 PM   Procedural Pain 0 04/10/2017  3:48 PM   Length (Post-Debridement) 0.4 cm 04/10/2017  3:48 PM   Width (Post Debridement) 0.9 cm 04/10/2017  3:48 PM   Depth (Post-Debridement) 0.2 cm 04/10/2017  3:48 PM   Post Procedural Pain 0 03/21/2017  8:57 AM   Area(Pre-Debridement) 0.12 sq cm 04/10/2017  3:48 PM   Area(Post-Debridement) 0.36 sq cm 04/10/2017  3:48 PM   Volume(Pre-Debridement) 0.07 sq cm 04/10/2017  3:48 PM   Volume(Post-Debridement) 0.07 sq cm 04/10/2017  3:48 PM       Wound 08/25/15 Foot Left Bottom Lateral Fissure (Active)       Wound 09/14/16 left lateral foot (Active)     Extremities: The patient has strong audible Doppler signals that are biphasic by hand-held Doppler.  The lateral foot ulcer is completely covered with callusing.  However there is fluctuance with a small pinpoint amount of drainage when palpating over the callus.  Therefore selective debridement was performed as described above.  A small  ulcer which is 0.12 cm?? is evident.  Post debridement measurements as described.  I was able to excise all of the callus encroaching over the ulcer.  There was granulation tissue present at the base of the wound.  Maceration noted where drainage had eroded intact skin surrounding the wound.    Impression: Left diabetic foot ulcer and PAD    Treatment plan    1.  There is no evidence of infection so we will hold off on culturing the wound.  No antibiotics warranted at this time but low threshold to culture and call in a prescription for antibiotics.  Patient is status post renal transplant.    2.  Feel confident now that all of the callusing has been excised that the Iodoflex will now be able to work.  He will use a triple cleansing regimen including Hibiclens, acetic acid wash, and Dakin solution to cleanse the ulcer prior to dressing changes.  Daily dressings.    3.  Continue to use the offloading shoe.    4.  If the ulcer has no improvement by the next visit then will repeat x-rays and PVLs.    5.  Return to clinic in 2 weeks for routine evaluation and dressing change and return precautions have been reviewed in detail.

## 2017-05-01 ENCOUNTER — Ambulatory Visit
Admission: RE | Admit: 2017-05-01 | Discharge: 2017-05-01 | Disposition: A | Discharge status: Home / Self Care | Payer: MEDICARE | Attending: Vascular Surgery | Admitting: Vascular Surgery

## 2017-05-01 ENCOUNTER — Ambulatory Visit
Admission: RE | Admit: 2017-05-01 | Discharge: 2017-05-01 | Disposition: A | Discharge status: Home / Self Care | Payer: MEDICARE

## 2017-05-01 DIAGNOSIS — L97529 Non-pressure chronic ulcer of other part of left foot with unspecified severity: Secondary | ICD-10-CM

## 2017-05-01 DIAGNOSIS — L97522 Non-pressure chronic ulcer of other part of left foot with fat layer exposed: Secondary | ICD-10-CM

## 2017-05-01 DIAGNOSIS — E11621 Type 2 diabetes mellitus with foot ulcer: Principal | ICD-10-CM

## 2017-05-01 LAB — COMPREHENSIVE METABOLIC PANEL
ALBUMIN: 3.9 g/dL (ref 3.5–5.0)
ALKALINE PHOSPHATASE: 73 U/L (ref 38–126)
ALT (SGPT): 37 U/L (ref 19–72)
ANION GAP: 11 mmol/L (ref 9–15)
AST (SGOT): 32 U/L (ref 19–55)
BILIRUBIN TOTAL: 0.7 mg/dL (ref 0.0–1.2)
BLOOD UREA NITROGEN: 39 mg/dL — ABNORMAL HIGH (ref 7–21)
BUN / CREAT RATIO: 25
CALCIUM: 9.2 mg/dL (ref 8.5–10.2)
CHLORIDE: 101 mmol/L (ref 98–107)
CO2: 28 mmol/L (ref 22.0–30.0)
CREATININE: 1.56 mg/dL — ABNORMAL HIGH (ref 0.70–1.30)
EGFR MDRD AF AMER: 54 mL/min/{1.73_m2} — ABNORMAL LOW (ref >=60)
EGFR MDRD NON AF AMER: 44 mL/min/{1.73_m2} — ABNORMAL LOW (ref >=60)
GLUCOSE RANDOM: 217 mg/dL — ABNORMAL HIGH (ref 65–179)
PROTEIN TOTAL: 7 g/dL (ref 6.5–8.3)
SODIUM: 140 mmol/L (ref 135–145)

## 2017-05-01 LAB — PROTIME-INR: PROTIME: 30.3 s — ABNORMAL HIGH (ref 10.2–12.8)

## 2017-05-01 LAB — CBC W/ AUTO DIFF
BASOPHILS ABSOLUTE COUNT: 0 10*9/L (ref 0.0–0.1)
EOSINOPHILS ABSOLUTE COUNT: 0.1 10*9/L (ref 0.0–0.4)
HEMATOCRIT: 47.6 % (ref 41.0–53.0)
HEMOGLOBIN: 15.7 g/dL (ref 13.5–17.5)
LARGE UNSTAINED CELLS: 3 % (ref 0–4)
LYMPHOCYTES ABSOLUTE COUNT: 1.2 10*9/L — ABNORMAL LOW (ref 1.5–5.0)
MEAN CORPUSCULAR HEMOGLOBIN CONC: 33 g/dL (ref 31.0–37.0)
MEAN CORPUSCULAR HEMOGLOBIN: 29.1 pg (ref 26.0–34.0)
MEAN CORPUSCULAR VOLUME: 88.3 fL (ref 80.0–100.0)
MEAN PLATELET VOLUME: 8.9 fL (ref 7.0–10.0)
NEUTROPHILS ABSOLUTE COUNT: 4.5 10*9/L (ref 2.0–7.5)
PLATELET COUNT: 170 10*9/L (ref 150–440)
RED CELL DISTRIBUTION WIDTH: 14.5 % (ref 12.0–15.0)
WBC ADJUSTED: 6.5 10*9/L (ref 4.5–11.0)

## 2017-05-01 LAB — CREATININE: Creatinine:MCnc:Pt:Ser/Plas:Qn:: 1.56 — ABNORMAL HIGH

## 2017-05-01 LAB — MONOCYTES ABSOLUTE COUNT: Lab: 0.4

## 2017-05-01 LAB — PHOSPHORUS: Phosphate:MCnc:Pt:Ser/Plas:Qn:: 4.5

## 2017-05-01 LAB — PROTIME: Lab: 30.3 — ABNORMAL HIGH

## 2017-05-01 LAB — TACROLIMUS BLOOD: Lab: 7.3

## 2017-05-01 NOTE — Unmapped (Signed)
Dressing:    Wash area with Antibacterial liquid soap with one wash cloth, clean the soap off with a clean cloth, and then pat dry with a dry cloth before the dressing change.    Apply silver gel to the wound bed, and 4 sided adhesive band-aid. Change daily      If you have any questions or concerns regarding your wound or wound care, please contact us at the [facility] Wound Healing and Podiatry clinic at [phone].      Wear your DH2 shoe

## 2017-05-02 NOTE — Unmapped (Signed)
David Ellis is a 71 year old gentleman with multiple medical problems who returns for routine follow-up of his left lateral diabetic foot ulcer. He has underlying PAD with runoff disease and a toe pressure of 92 mmHg.   The area continues to callus over with no resolution of the ulceration.  He has had no fevers or chills or night sweats or any redness or swelling around the ulcerative area. Currently applying iodoform dressings which seem to dry out the wound and callus is thick.  Physical exam    General: 71 year old gentleman in no apparent distress.    Vitals:    05/01/17 1447   BP: 132/74   Pulse: 60   Temp: 36.8 ??C (98.3 ??F)     Wound 07/19/15 left lateral foot scattered (Active)   Wound Status Not Healed 04/10/2017  3:32 PM   Pain 0 04/10/2017  3:32 PM   Dressing Status      Removed 04/10/2017  3:32 PM   Wound Length (cm)      0.2 05/01/2017  2:43 PM   Wound Width (cm)      0.3 05/01/2017  2:43 PM   Wound Depth (cm)      0.5 05/01/2017  2:43 PM   Area (sq cm) 0.06 05/01/2017  2:43 PM   Volume (mL) 0.03 05/01/2017  2:43 PM   Odor None 05/01/2017  2:43 PM   Margins Undefined edges 05/01/2017  2:43 PM   Peri-wound Assessment      Callus 05/01/2017  2:43 PM   Encounter Subsequent 05/01/2017  2:43 PM   Slough % 26-50% 05/01/2017  2:43 PM   Granulation % 26-50% 05/01/2017  2:43 PM   Exudate Type      Sero-sanguineous 05/01/2017  2:43 PM   Exudate Amnt      Moderate 05/01/2017  2:43 PM   Thickness Full Thickness 05/01/2017  2:43 PM   Exposed Structure N/A 05/01/2017  2:43 PM   Paring Yes 05/01/2017  2:43 PM   Texture Callus 05/01/2017  2:43 PM   Moisture Normal For Patient 05/01/2017  2:43 PM   Temperature WNL 05/01/2017  2:43 PM   Treatments Cleansed/Irrigation 05/01/2017  2:43 PM   Picture Taken Yes 05/01/2017  2:43 PM   Dressing Other (Comment) 05/01/2017  3:32 PM   Length (Pre Debridement) 0.2 cm 04/10/2017  3:48 PM   Width (Pre Debridement) 0.6 cm 04/10/2017  3:48 PM   Depth (Pre Debridement) 0.6 cm 04/10/2017 3:48 PM   Pre-Procedure Pain 0 04/10/2017  3:48 PM   Time Out Taken Yes 04/10/2017  3:48 PM   Instrument Used scalpel 04/10/2017  3:48 PM   Tissue/Material Removed callus;non-viable 04/10/2017  3:48 PM   Bleeding minimal 04/10/2017  3:48 PM   Bleeding controlled with pressure 04/10/2017  3:48 PM   Specimen Taken none 04/10/2017  3:48 PM   Type of Debridement selective 04/10/2017  3:48 PM   Level of Debridement subcutaneous tissue 04/10/2017  3:48 PM   Procedural Pain 0 04/10/2017  3:48 PM   Length (Post-Debridement) 0.4 cm 04/10/2017  3:48 PM   Width (Post Debridement) 0.9 cm 04/10/2017  3:48 PM   Depth (Post-Debridement) 0.2 cm 04/10/2017  3:48 PM   Area(Pre-Debridement) 0.12 sq cm 04/10/2017  3:48 PM   Area(Post-Debridement) 0.36 sq cm 04/10/2017  3:48 PM   Volume(Pre-Debridement) 0.07 sq cm 04/10/2017  3:48 PM   Volume(Post-Debridement) 0.07 sq cm 04/10/2017  3:48 PM       Wound 08/25/15 Foot Left Bottom  Lateral Fissure (Active)       Wound 09/14/16 left lateral foot (Active)     Extremities:  Palp pedal pulses bilat  The lateral foot ulcer is completely covered with callusing, no evidence of drainage beneath. No evidence of infection. After callus removal tissue is healthy except for a very small pinpoint wound.    No tunnels, no other complications    Impression: Left diabetic foot ulcer and PAD    Treatment plan    1.  The dressings may be drying the wound too much. Discussed use of Silvasorb gel QD and protective cover    2.  Continue to use the offloading shoe.    3.  Continue aggressive offloading.    4.  Consider football if no progress

## 2017-05-03 NOTE — Unmapped (Signed)
Left a message for patient to return our call.

## 2017-05-03 NOTE — Unmapped (Signed)
Patient notified, voiced understanding.

## 2017-05-03 NOTE — Unmapped (Signed)
-----   Message from Sharlee Blew, MD sent at 05/03/2017  7:50 AM EST -----  Call and let patient know labs stable.  INR ok.  Same dose; repeat in one month    [name], M.D.     [facility] Internal Medicine at [facility]   [address]  [address]  [place], [place]  [address]  [phone]

## 2017-05-15 NOTE — Unmapped (Signed)
[facility] Pharm[facility] Refill Coordination Note  Specialty Medication(s): Prograf 1mg  & Myfortic 180mg     STONY STEGMANN, DOB: February 15, 1946  Phone: [phone] (home) [phone] (work), Alternate phone contact: N/A  Phone or address changes today?: No  All above HIPAA information was verified with patient.  Shipping Address: [address]  [place] [address]   Insurance changes? No    Completed refill call assessment today to schedule patient's medication shipment from the [facility] [phone]).      Confirmed the medication and dosage are correct and have not changed: Yes, regimen is correct and unchanged.    Confirmed patient started or stopped the following medications in the past month:  No, there are no changes reported at this time.    Are you tolerating your medication?:  [place] reports tolerating the medication.    ADHERENCE    (Below is required for Medicare Part B or Transplant patients only - per drug):   How many tablets were dispensed last month:     Myfortic 180 mg   Quantity filled last month: 180   # of tablets left on hand: 10 Days    Prograf 1 mg   Quantity filled last month: 180   # of tablets left on hand: 10 Days    Did you miss any doses in the past 4 weeks? No missed doses reported.    FINANCIAL/SHIPPING    Delivery Scheduled: Yes, Expected medication delivery date: 05/23/2017     [place] did not have any additional questions at this time.    Delivery address validated in FSI scheduling system: Yes, address listed in FSI is correct.    We will follow up with patient monthly for standard refill processing and delivery.      Thank you,  Tamala Fothergill   [facility] Specialty Technician

## 2017-05-17 NOTE — Unmapped (Signed)
Per lab result note from Dr. Dellis Filbert on 05/03/17, INR okay and patient to recheck in one month. INR was 2.66, patient is taking 5mg  every day.

## 2017-05-20 MED ORDER — METOPROLOL SUCCINATE ER 200 MG TABLET,EXTENDED RELEASE 24 HR
ORAL_TABLET | 3 refills | 0 days | Status: CP
Start: 2017-05-20 — End: 2018-01-24

## 2017-05-20 NOTE — Unmapped (Signed)
[facility] Internal Med pt

## 2017-05-21 NOTE — Unmapped (Signed)
Rx

## 2017-05-22 ENCOUNTER — Ambulatory Visit
Admission: RE | Admit: 2017-05-22 | Discharge: 2017-05-22 | Disposition: A | Discharge status: Home / Self Care | Payer: MEDICARE | Attending: Vascular Surgery | Admitting: Vascular Surgery

## 2017-05-22 DIAGNOSIS — L97522 Non-pressure chronic ulcer of other part of left foot with fat layer exposed: Secondary | ICD-10-CM

## 2017-05-22 DIAGNOSIS — E11621 Type 2 diabetes mellitus with foot ulcer: Principal | ICD-10-CM

## 2017-05-22 MED FILL — MYFORTIC/180MG/TAB: MYFORTIC/180MG/TAB | 30 days supply | Qty: 180 | Fill #5

## 2017-05-22 MED FILL — PROGRAF/1MG/CAP: PROGRAF/1MG/CAP | 30 days supply | Qty: 180 | Fill #5

## 2017-05-22 NOTE — Unmapped (Signed)
Dressing:    Wash area with Antibacterial liquid soap with one wash cloth, clean the soap off with a clean cloth, and then pat dry with a dry cloth before the dressing change.    Continue with you current dressing of silver gel, bordered gauze      If you have any questions or concerns regarding your wound or wound care, please contact us at the [facility] Wound Healing and Podiatry clinic at [phone].

## 2017-05-23 NOTE — Unmapped (Signed)
David Ellis is a 71 year old gentleman with multiple medical problems who returns for routine follow-up of his left lateral diabetic foot ulcer. He has underlying PAD with runoff disease and a toe pressure of 92 mmHg.   The area continues to callus over with no resolution of the ulceration.  He has had no fevers or chills or night sweats or any redness or swelling around the ulcerative area. Currently applying iodoform dressings which seem to dry out the wound and callus is thick.  Physical exam    General: 71 year old gentleman in no apparent distress.    Vitals:    05/22/17 0817   BP: 145/70   Pulse: 64   Temp: 36.4 ??C (97.5 ??F)     Wound 07/19/15 left lateral foot scattered (Active)   Wound Status Not Healed 05/22/2017  8:19 AM   Pain 0 05/22/2017  8:19 AM   Dressing Status      Removed 05/22/2017  8:19 AM   Wound Length (cm)      0.1 05/22/2017  8:19 AM   Wound Width (cm)      0.3 05/22/2017  8:19 AM   Wound Depth (cm)      0.3 05/22/2017  8:19 AM   Area (sq cm) (Calculated) 0.03 05/22/2017  8:19 AM   Volume (mL) (Calculated) 0.01 05/22/2017  8:19 AM   Odor None 05/22/2017  8:19 AM   Margins Defined edges 05/22/2017  8:19 AM   Peri-wound Assessment      Callus 05/22/2017  8:19 AM   Encounter Subsequent 05/22/2017  8:19 AM   Slough % None 05/22/2017  8:19 AM   Eschar % None 05/22/2017  8:19 AM   Epithelialization % None 05/22/2017  8:19 AM   Granulation % 76-100% 05/22/2017  8:19 AM   Exudate Type      Sero-sanguineous 05/22/2017  8:19 AM   Exudate Amnt      Moderate 05/22/2017  8:19 AM   Thickness Full Thickness 05/22/2017  8:19 AM   Exposed Structure N/A 05/22/2017  8:19 AM   Paring Yes 05/01/2017  2:43 PM   Texture Callus 05/22/2017  8:19 AM   Moisture Normal For Patient 05/22/2017  8:19 AM   Temperature WNL 05/22/2017  8:19 AM   Hypergranuation No 05/22/2017  8:19 AM   Tunneling      No 05/22/2017  8:19 AM   Undermining     No 05/22/2017  8:19 AM   Sinus Tract No 05/22/2017  8:19 AM   Treatments Cleansed/Irrigation 05/22/2017  8:19 AM   Picture Taken Yes 05/22/2017  8:19 AM   Dressing Other (Comment) 05/01/2017  3:32 PM   Length (Pre Debridement) 0.1 cm 05/22/2017  8:58 AM   Width (Pre Debridement) 0.3 cm 05/22/2017  8:58 AM   Depth (Pre Debridement) 0.3 cm 05/22/2017  8:58 AM   Pre-Procedure Pain 0 05/22/2017  8:58 AM   Time Out Taken Yes 05/22/2017  8:58 AM   Instrument Used scalpel 05/22/2017  8:58 AM   Tissue/Material Removed non-viable;callus 05/22/2017  8:58 AM   Bleeding minimal 05/22/2017  8:58 AM   Bleeding controlled with pressure 05/22/2017  8:58 AM   Specimen Taken none 05/22/2017  8:58 AM   Type of Debridement selective 05/22/2017  8:58 AM   Level of Debridement skin, dermis 05/22/2017  8:58 AM   Procedural Pain 0 05/22/2017  8:58 AM   Length (Post-Debridement) 0.2 cm 05/22/2017  8:58 AM   Width (Post Debridement) 0.3 cm 05/22/2017  8:58 AM   Depth (Post-Debridement) 0.2 cm 05/22/2017  8:58 AM   Area(Pre-Debridement) 0.03 sq cm 05/22/2017  8:58 AM   Area(Post-Debridement) 0.06 sq cm 05/22/2017  8:58 AM   Volume(Pre-Debridement) 0.01 sq cm 05/22/2017  8:58 AM   Volume(Post-Debridement) 0.01 sq cm 05/22/2017  8:58 AM       Wound 08/25/15 Foot Left Bottom Lateral Fissure (Active)       Wound 09/14/16 left lateral foot (Active)       Extremities:  Palp pedal pulses bilat  The lateral foot ulcer is completely covered with callusing, no evidence of drainage beneath. No evidence of infection. After callus removal tissue is healthy except for a very small pinpoint wound.    Overall this wound has shown good progress over the last 6 weeks.  No tunnels, no other complications    Based on the necrotic tissue and biofilm present, the decision was made to debride. All Benefits, risks, and alternatives were explained in detail to the patient. A time out was taken prior to the procedure. The area was prepped and draped in standard clinic fashion. Selective debridement was performed to fully debride all devitalized and necrotic tissue down to bleeding granulation tissue with the use of a curette, forceps and scalpel. Topical 4% lidocaine was applied 10 minutes prior to the debridement and the patient tolerated this well. Hemostasis was achieved with pressure and the measurements did not change post-operatively.  Impression: Left diabetic foot ulcer and PAD    Treatment plan    1.  The dressings may be drying the wound too much. Discussed use of Silvasorb gel QD and protective cover    2.  Continue to use the offloading shoe.    3.  Continue aggressive offloading.    4.  Will need to slowly increase activity to prevent recurrent ulceration.

## 2017-05-30 ENCOUNTER — Ambulatory Visit: Admission: RE | Admit: 2017-05-30 | Discharge: 2017-05-30 | Disposition: A | Discharge status: Home / Self Care

## 2017-05-30 ENCOUNTER — Ambulatory Visit
Admission: RE | Admit: 2017-05-30 | Discharge: 2017-05-30 | Disposition: A | Discharge status: Home / Self Care | Payer: MEDICARE

## 2017-05-30 DIAGNOSIS — E1159 Type 2 diabetes mellitus with other circulatory complications: Secondary | ICD-10-CM

## 2017-05-30 DIAGNOSIS — I4891 Unspecified atrial fibrillation: Secondary | ICD-10-CM

## 2017-05-30 DIAGNOSIS — Z940 Kidney transplant status: Principal | ICD-10-CM

## 2017-05-30 LAB — CBC W/ AUTO DIFF
BASOPHILS ABSOLUTE COUNT: 0 10*9/L (ref 0.0–0.1)
EOSINOPHILS ABSOLUTE COUNT: 0.1 10*9/L (ref 0.0–0.4)
HEMATOCRIT: 47.5 % (ref 41.0–53.0)
LARGE UNSTAINED CELLS: 2 % (ref 0–4)
MEAN CORPUSCULAR HEMOGLOBIN CONC: 31.6 g/dL (ref 31.0–37.0)
MEAN CORPUSCULAR VOLUME: 87.4 fL (ref 80.0–100.0)
MEAN PLATELET VOLUME: 9.3 fL (ref 7.0–10.0)
MONOCYTES ABSOLUTE COUNT: 0.5 10*9/L (ref 0.2–0.8)
NEUTROPHILS ABSOLUTE COUNT: 5.4 10*9/L (ref 2.0–7.5)
PLATELET COUNT: 217 10*9/L (ref 150–440)
RED BLOOD CELL COUNT: 5.43 10*12/L (ref 4.50–5.90)
RED CELL DISTRIBUTION WIDTH: 14.2 % (ref 12.0–15.0)
WBC ADJUSTED: 7.4 10*9/L (ref 4.5–11.0)

## 2017-05-30 LAB — COMPREHENSIVE METABOLIC PANEL
ALBUMIN: 3.9 g/dL (ref 3.5–5.0)
ALKALINE PHOSPHATASE: 76 U/L (ref 38–126)
ALT (SGPT): 35 U/L (ref 19–72)
BILIRUBIN TOTAL: 0.9 mg/dL (ref 0.0–1.2)
BLOOD UREA NITROGEN: 37 mg/dL — ABNORMAL HIGH (ref 7–21)
BUN / CREAT RATIO: 27
CALCIUM: 9.1 mg/dL (ref 8.5–10.2)
CHLORIDE: 104 mmol/L (ref 98–107)
CO2: 26 mmol/L (ref 22.0–30.0)
CREATININE: 1.36 mg/dL — ABNORMAL HIGH (ref 0.70–1.30)
EGFR MDRD AF AMER: >=60 mL/min/{1.73_m2} (ref >=60)
EGFR MDRD NON AF AMER: 52 mL/min/{1.73_m2} — ABNORMAL LOW (ref >=60)
GLUCOSE RANDOM: 231 mg/dL — ABNORMAL HIGH (ref 65–179)
POTASSIUM: 5.5 mmol/L — ABNORMAL HIGH (ref 3.5–5.0)
PROTEIN TOTAL: 6.4 g/dL — ABNORMAL LOW (ref 6.5–8.3)
SODIUM: 139 mmol/L (ref 135–145)

## 2017-05-30 LAB — HEMOGLOBIN A1C
HEMOGLOBIN A1C: 9.8 % — ABNORMAL HIGH (ref 4.8–5.6)
Hemoglobin A1c/Hemoglobin.total:MFr:Pt:Bld:Qn:: 9.8 — ABNORMAL HIGH

## 2017-05-30 LAB — PHOSPHORUS: Phosphate:MCnc:Pt:Ser/Plas:Qn:: 4.3

## 2017-05-30 LAB — TACROLIMUS BLOOD: Lab: 7.5

## 2017-05-30 LAB — INR: Lab: 3.48

## 2017-05-30 LAB — CREATININE: Creatinine:MCnc:Pt:Ser/Plas:Qn:: 1.36 — ABNORMAL HIGH

## 2017-05-30 LAB — RED BLOOD CELL COUNT: Lab: 5.43

## 2017-05-31 NOTE — Unmapped (Signed)
Left message for patient to return call to office. 

## 2017-05-31 NOTE — Unmapped (Signed)
Spoke with patient who voiced understanding of dosing change. He states that his wife coordinates his schedule, so she will call back to set appointment for INR check.

## 2017-05-31 NOTE — Unmapped (Signed)
Decrease from 5 mg daily to 5 mg daily except MOn, Thu, take 2.5.    REpeat INR 1-2 weeks.    [name], M.D.     [facility] Internal Medicine at [facility]   [address]  [address]  [place], [place]  [address]  [phone]

## 2017-05-31 NOTE — Unmapped (Signed)
-----   Message from Sharlee Blew, MD sent at 05/30/2017  7:01 PM EST -----  Call and let aptienet know las ok except INR is high.  What is current dosing?    [name], M.D.     [facility] Internal Medicine at [facility]   [address]  [address]  [place], [place]  [address]  [phone]

## 2017-05-31 NOTE — Unmapped (Signed)
Patient states he is taking 5 mg daily for a weekly total of 35 mg. Please advise as to any change in dosing.

## 2017-06-06 ENCOUNTER — Ambulatory Visit: Admit: 2017-06-06 | Discharge: 2017-06-06 | Payer: MEDICARE

## 2017-06-06 ENCOUNTER — Other Ambulatory Visit: Admit: 2017-06-06 | Discharge: 2017-06-06 | Payer: MEDICARE

## 2017-06-06 DIAGNOSIS — H4312 Vitreous hemorrhage, left eye: Principal | ICD-10-CM

## 2017-06-06 DIAGNOSIS — H35373 Puckering of macula, bilateral: Secondary | ICD-10-CM

## 2017-06-06 DIAGNOSIS — E113593 Type 2 diabetes mellitus with proliferative diabetic retinopathy without macular edema, bilateral: Secondary | ICD-10-CM

## 2017-06-06 LAB — PROTIME-INR: PROTIME: 24.7 s — ABNORMAL HIGH (ref 10.2–12.8)

## 2017-06-06 LAB — INR: Lab: 2.17

## 2017-06-06 NOTE — Unmapped (Addendum)
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  I reviewed the resident???s note.  I agree with the resident???s findings and plan including interpretation of images.

## 2017-06-07 NOTE — Unmapped (Signed)
Patient had labs drawn today, INR was 2.17. Last INR was 3.48 on 05/30/17. Patient to keep dose the same and recheck in two weeks.

## 2017-06-12 ENCOUNTER — Ambulatory Visit: Admit: 2017-06-12 | Discharge: 2017-06-13 | Payer: MEDICARE

## 2017-06-12 DIAGNOSIS — L97522 Non-pressure chronic ulcer of other part of left foot with fat layer exposed: Secondary | ICD-10-CM

## 2017-06-12 DIAGNOSIS — I739 Peripheral vascular disease, unspecified: Secondary | ICD-10-CM

## 2017-06-12 DIAGNOSIS — E11621 Type 2 diabetes mellitus with foot ulcer: Principal | ICD-10-CM

## 2017-06-12 NOTE — Unmapped (Addendum)
Dressing:    Wash area with Antibacterial liquid soap with one wash cloth, clean the soap off with a clean cloth, and then pat dry with a dry cloth before the dressing change.    Continue with you current dressing of silver gel, bordered gauze      If you have any questions or concerns regarding your wound or wound care, please contact us at the [facility] clinic at [phone].

## 2017-06-12 NOTE — Unmapped (Signed)
Debridement  Date/Time: 06/12/2017 9:44 AM  Performed by: Barbette Or  Authorized by: Barbette Or   Preparation: Patient was prepped and draped in the usual sterile fashion.  Local anesthesia used: yes    Anesthesia:  Local anesthesia used: yes  Local Anesthetic: topical anesthetic    Sedation:  Patient sedated: no  Patient tolerance: Patient tolerated the procedure well with no immediate complications  Comments: Based on the necrotic tissue and biofilm present, the decision was made to debride. All Benefits, risks, and alternatives were explained in detail to the patient. A time out was taken prior to the procedure. The area was prepped and draped in standard clinic fashion. Selective debridement was performed to fully debride all devitalized and necrotic tissue down to bleeding granulation tissue with the use of a curette, forceps and scalpel. Topical 4% lidocaine was applied 10 minutes prior to the debridement and the patient tolerated this well. Hemostasis was achieved with pressure and the measurements did not change post-operatively.               Patient Active Problem List   Diagnosis   ??? Type II diabetes mellitus with renal manifestations, uncontrolled (CMS-HCC)   ??? Hypertension   ??? History of kidney transplant   ??? Mixed hyperlipidemia   ??? 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 Transplanted kidney   ??? Pulmonary embolism (CMS-HCC)   ??? Edema   ??? Right ventricular dysfunction   ??? Atrial fibrillation (CMS-HCC)   ??? Aftercare following organ transplant   ??? Diverticulosis of colon   ??? Routine general medical examination at a health care facility   ??? Diabetic polyneuropathy associated with type 2 diabetes mellitus (CMS-HCC)   ??? Diabetic ulcer of left foot associated with type 2 diabetes mellitus (CMS-HCC)   ??? Toe amputation status (CMS-HCC)   ??? On statin therapy due to risk of future cardiovascular event   ??? Left knee pain   ??? BCC (basal cell carcinoma), face   ??? Critical lower limb ischemia ??? Acquired absence of other toe(s), unspecified side (CMS-HCC)   ??? History of colonic polyps   ??? Non-pressure chronic ulcer of other part of unspecified foot with unspecified severity (CMS-HCC)   ??? 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 Hypoglycemia associated with type 2 diabetes mellitus (CMS-HCC)   ??? PAD (peripheral artery disease) (CMS-HCC)     David Ellis is a 72 year old gentleman with the above medical problems who returns for routine follow-up of his left lateral diabetic foot ulcer.  He has underlying PAD with a toe pressure of 92 mmHg on his last evaluation.  He denies any rest pain or claudication symptoms.  He is needing a new offloading shoe prescription and if appropriate would like to be considered for diabetic shoes and custom orthotics.  On his last visit he was switched from iodoform packing to silver sorb gel and there has been some improvement.  He is not developed nearly as much callus over the ulcer as has been in the past.  No recent fevers or chills or night sweats or other new concerns.    Physical exam    General: 72 year old gentleman in no apparent distress.    Blood pressure 139/73, pulse 64, temperature 36.6 ??C (97.9 ??F), temperature source Temporal.    Wound 07/19/15 left lateral foot scattered (Active)   Wound Status Not Healed 06/12/2017  8:03 AM   Pain 0 06/12/2017  8:03 AM  Dressing Status      Removed;Other (Comment) 06/12/2017  8:03 AM   Wound Length (cm)      0.1 06/12/2017  8:03 AM   Wound Width (cm)      0.1 06/12/2017  8:03 AM   Wound Depth (cm)      0.1 06/12/2017  8:03 AM   Area (sq cm) (Calculated) 0.01 06/12/2017  8:03 AM   Volume (mL) (Calculated) 0 06/12/2017  8:03 AM   Odor None 06/12/2017  8:03 AM   Margins Undefined edges 06/12/2017  8:03 AM   Peri-wound Assessment      Clean;Dry;Callus 06/12/2017  8:03 AM   Encounter Subsequent 06/12/2017  8:03 AM   Slough % None 05/22/2017  8:19 AM   Eschar % None 05/22/2017  8:19 AM   Epithelialization % None 05/22/2017  8:19 AM   Granulation % 76-100% 06/12/2017  8:03 AM   Exudate Type      Sero-sanguineous 06/12/2017  8:03 AM   Exudate Amnt      Scant 06/12/2017  8:03 AM   Thickness Full Thickness 06/12/2017  8:03 AM   Exposed Structure N/A 05/22/2017  8:19 AM   Paring No 06/12/2017  8:03 AM   Texture Callus 06/12/2017  8:03 AM   Moisture Normal For Patient 06/12/2017  8:03 AM   Temperature WNL 05/22/2017  8:19 AM   Hypergranuation No 06/12/2017  8:03 AM   Tunneling      No 06/12/2017  8:03 AM   Undermining     No 06/12/2017  8:03 AM   Sinus Tract No 06/12/2017  8:03 AM   Treatments Cleansed/Irrigation 06/12/2017  8:03 AM   Picture Taken Yes 06/12/2017  8:03 AM   Dressing Other (Comment) 06/12/2017  8:31 AM   Length (Pre Debridement) 0.1 cm 06/12/2017  8:31 AM   Width (Pre Debridement) 0.1 cm 06/12/2017  8:31 AM   Depth (Pre Debridement) 0.1 cm 06/12/2017  8:31 AM   Pre-Procedure Pain 0 06/12/2017  8:31 AM   Time Out Taken Yes 06/12/2017  8:31 AM   Instrument Used scalpel 06/12/2017  8:31 AM   Tissue/Material Removed non-viable;callus 06/12/2017  8:31 AM   Bleeding minimal 06/12/2017  8:31 AM   Bleeding controlled with pressure 06/12/2017  8:31 AM   Specimen Taken none 06/12/2017  8:31 AM   Type of Debridement selective 06/12/2017  8:31 AM   Level of Debridement skin, dermis 06/12/2017  8:31 AM   Procedural Pain 0 06/12/2017  8:31 AM   Length (Post-Debridement) 0.2 cm 06/12/2017  8:31 AM   Width (Post Debridement) 0.2 cm 06/12/2017  8:31 AM   Depth (Post-Debridement) 0.1 cm 06/12/2017  8:31 AM   Post Procedural Pain 0 06/12/2017  8:31 AM   Area(Pre-Debridement) 0.01 sq cm 06/12/2017  8:31 AM   Area(Post-Debridement) 0.04 sq cm 06/12/2017  8:31 AM   Volume(Pre-Debridement) 0 sq cm 06/12/2017  8:31 AM   Volume(Post-Debridement) 0 sq cm 06/12/2017  8:31 AM       Wound 08/25/15 Foot Left Bottom Lateral Fissure (Active)       Wound 09/14/16 left lateral foot (Active)     Extremities: The patient has brisk audible Doppler signals by handheld Doppler.  There is callus overlying the ulceration on the plantar left foot which is located in the high pressure area where multiple toes have been amputated as well as partial metatarsal resection.  Despite this there is less callus formation since his last visit.  Selective debridement has been  performed to fully debride all excessive callusing and necrotic tissue surrounding the ulcer.  After debridement the ulcer only measures approximately 2 mm x 1 mm x 1 mm.  It is nearly healed.  No evidence of acute infection and no sinus tracts or undermining.    Impression: Nearly healed left diabetic foot ulcer    Treatment plan    1.  Continue with topical silver sorb gel once daily.    2.  The offloading shoe he is wearing appears to be significantly oversized.    Description for an offloading shoe has been provided.    3.  I think at this point he will be ready to transition to diabetic shoes and orthotics fairly soon.  A prescription has been provided.  He tends to have swelling that waxes and wanes and so edema accommodating shoes would likely be appropriate and this patient.  Toe filler also recommended as well.    4.  Return to clinic in 2-3 weeks for routine evaluation and dressing change and return precautions have been reviewed in detail.

## 2017-06-13 NOTE — Unmapped (Signed)
[facility] Refill Coordination Note  Specialty Medication(s): Prograf 1mg , and Myfortic 180mg   Additional Medications shipped: none    David Ellis, DOB: 12-11-1945  Phone: [phone] (home) [phone] (work), Alternate phone contact: N/A  Phone or address changes today?: No  All above HIPAA information was verified with patient.  Shipping Address: [address]  [place] [address]   Insurance changes? No    Completed refill call assessment today to schedule patient's medication shipment from the [facility] [phone]).      Confirmed the medication and dosage are correct and have not changed: Yes, regimen is correct and unchanged.    Confirmed patient started or stopped the following medications in the past month:  No, there are no changes reported at this time.    Are you tolerating your medication?:  [name] reports tolerating the medication.    [facility]    Prograf 1 mg   Quantity filled last month: 180   # of capsules left on hand: 84      Myfortic 180 mg   Quantity filled last month: 180   # of tablets left on hand: 84          Did you miss any doses in the past 4 weeks? No missed doses reported.    FINANCIAL/SHIPPING    Delivery Scheduled: Yes, Expected medication delivery date: 06/19/17     David Ellis did not have any additional questions at this time.    Delivery address validated in [facility] scheduling system: Yes, address listed in [facility] is correct.    We will follow up with patient monthly for standard refill processing and delivery.      Thank you,  Roderic Palau   [facility] Specialty Pharmacist

## 2017-06-17 MED ORDER — GABAPENTIN 300 MG CAPSULE
ORAL_CAPSULE | 0 refills | 0 days | Status: CP
Start: 2017-06-17 — End: 2017-08-27

## 2017-06-17 MED ORDER — CHLORTHALIDONE 25 MG TABLET
ORAL_TABLET | 0 refills | 0 days | Status: CP
Start: 2017-06-17 — End: 2017-09-25

## 2017-06-17 NOTE — Unmapped (Signed)
Per last MD note pt to continue taking Chorthalidone 25mg  daily. Refilled #60 with no refills to get pt to f/u appt in Feb. And refilled Gabapentin 300mg  twice daily. Refilled #90 with no refills to get pt to f/u appt in Feb

## 2017-06-18 MED FILL — MYFORTIC/180MG/TAB: MYFORTIC/180MG/TAB | 30 days supply | Qty: 180 | Fill #6

## 2017-06-18 MED FILL — PROGRAF/1MG/CAP: PROGRAF/1MG/CAP | 30 days supply | Qty: 180 | Fill #6

## 2017-06-18 NOTE — Unmapped (Signed)
Error

## 2017-06-27 ENCOUNTER — Ambulatory Visit: Admit: 2017-06-27 | Discharge: 2017-06-28 | Payer: MEDICARE | Attending: "Endocrinology | Primary: "Endocrinology

## 2017-06-27 DIAGNOSIS — E1129 Type 2 diabetes mellitus with other diabetic kidney complication: Principal | ICD-10-CM

## 2017-06-27 DIAGNOSIS — L97529 Non-pressure chronic ulcer of other part of left foot with unspecified severity: Secondary | ICD-10-CM

## 2017-06-27 DIAGNOSIS — E11621 Type 2 diabetes mellitus with foot ulcer: Secondary | ICD-10-CM

## 2017-06-27 DIAGNOSIS — Z89429 Acquired absence of other toe(s), unspecified side: Secondary | ICD-10-CM

## 2017-06-27 DIAGNOSIS — I739 Peripheral vascular disease, unspecified: Secondary | ICD-10-CM

## 2017-06-27 DIAGNOSIS — E11649 Type 2 diabetes mellitus with hypoglycemia without coma: Secondary | ICD-10-CM

## 2017-06-27 NOTE — Unmapped (Signed)
ASSESSMENT / PLAN  1. 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)  ?? Although HgbA1c was above goal in December, your glucoses the past several weeks have been in general exceellent, though trend of hypERglycemia at noon, and risk for hypOglycemia overnight -- I assume this is from stacking of insulin doses as day progresses.  To address this, increase bfast coverage to decrease mid day hyperglycemia, and decrease lunch and dinner coverage to reduce risk of hypoglycemia with accumulating doses.   ??   (9-10am) Bfast DIAL UP TO 105 UNITS (21 CLICKS)   ????????????????????????Reduce to 70 units if premeal glucose 75-100   ????????????????????????Reduce 60 units if glucose <??75 AND you are getting ready to eat a full meal   (2pm) Lunch DIAL UP TO 60 UNITS (12 CLICKS)  For PNButter, reduce to 50 units (10 clicks)  (7ish) Dinner: DIAL UP TO 40 UNITS (8 CLICKS)  For seafood, reduce to 30units (6 clicks)  ????????????????????????+45 ????units for glucose 201- 300   ????????????????????????+50 ????units 301- 400   If glucose <??100 at meal, reduce dose by 75%, and if glucose is <??75, reduce dose 50%.     - TSH; Future  - Endocrine Nurse; Future    2. Hypoglycemia associated with type 2 diabetes mellitus (CMS-HCC)  *The patient requires a therapeutic CGM.   *The patient has diabetes mellitus and is insulin-treated with 3 or more daily injections of insulin(MDII).   *The patient has been using CGM and is checking glucose 4 or more times per day x 30 days.  *The patient???s insulin treatment regimen requires frequent adjustments by the patient on the basis of therapeutic CGM testing results.   *Within the last six months prior to ordering the CGM, I had an in-person visit with the beneficiary to evaluate their diabetes control and determine that the above criteria are met.   *Every six months following the initial prescription of the CGM, I will have an in-person visit with the beneficiary to assess adherence to their CGM regimen and diabetes treatment plan.      3. Diabetic ulcer of left foot associated with type 2 diabetes mellitus, unspecified part of foot, unspecified ulcer stage (CMS-HCC)  4. Acquired absence of other toe(s), unspecified side (CMS-HCC)  5. PAD (peripheral artery disease) (CMS-HCC)  ?? Pt qualifies for DM shoes.  Please see details of exam below.     ?? Return in 3 months for RN visit to download [facility]; Return 6 months to see Tiburcio Pea  ?? Next time labs are drawn, add on TSH.     Greater than 50% of 25 min visit was spent face to face with patient on counseling and coordination of care regarding management of insulin dependent diabetes with multiple complications as detailed in note.      SUBJECTIVE  PCP: [name]  Tplant: Amy Mottle  Optho:  Herma Ard  Wound: Keagy  Podiatry: Dr. Ether Griffins, Gavin Potters     HPI: David Ellis is a 72 y.o. male who I last saw 12/2016 returns for follow up of T2DM with hx of kidney transplant.    Here with wife today.    After download of Freestyle in Sept, we communicated via MyChart with increase in insulin:  (9-10am) Bfast DIAL UP TO 95 UNITS (19 CLICKS = 95 UNITS)   ????????????????????????Reduce to 70 units if premeal glucose 75-100   ????????????????????????Reduce 60 units if glucose <??75 AND you are getting ready to eat a full meal   (  2pm) Lunch DIAL UP TO 65 UNITS (13 CLICKS ??= 65 UNITS)   (7ish) Dinner: DIAL UP TO 50 UNITS (10 CLICKS = 50 UNITS)   ????????????????????????+55 ????units for glucose 201- 300 (he counts 11 clicks = 555 units)   ????????????????????????+60 ????units 301- 400 (count 12 clicks = 60 units)   If glucose <??100 at meal, reduce dose by 75%, and if glucose is <??75, reduce dose 50%.     Although late December HgbA1c remained elevated above goal at 9.8, he has been aware of lower glucoses the past several weeks for no certain reasons.   Denies any change with new year, though did remove his boot for 1st time in 6 months last week, and is moving more.   Example of excellent glucoses this past week checking glucose 4x daily with Freestyle Libre.     Trends with chronic hyperglycemia around noon (after bfast) with hypoglycemia M-6am.  He reports his hypoglycemia hits quickly with change in vision and incoherent.  His wife can also tell.   He is aware of hypoglycemia after PNButter sandwich for lunch and after seafood for dinner.    Notably, he has gained 15 lbs (260 to 275 lbs) since I last saw him 12/2016.  He is aware of some increase in LE.     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 S/p 2 operations June/Aug 2018 w Dr. Waunita Schooner.  Saw FU 06/2017. Improved vision, but still some blurry.   CKD followed by Nephrology.  Peripheral neuropathy w hx of amputation, now healing foot ulcer managed by podiatry.  On Neurontin.  BP controlled on meds including ARB  Lipids on low dose pravachol.    ASA only prn on chonic coumadin.      Endocrine Obtained / Updated Problem List:   1. T2DM dx 1992, insulin roughly 2000:   ?? Proliferative DR, Stable, Wynonia Sours MD, [facility]  ?? CKD s/p renal transplant  ?? 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 No vibratory 06/2017.   ?? Hypoglycemic emergency requiring EMS 03/2011.   ?? Considered Victoza in past, though he was resistant in setting of costs and already multiple daily injections. Trulicity 600$ per mo.  ?? Offered to check cpeptide to see if he would qualify for insulin pump under medcare, though he is not interested.    ?? Off MTF since end of 2015/early 2016.   2. 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   3. HTN   4. Nephrolithiasis   5. OSA on cpap   6. Aflutter s/p ablation 06/2011, atrial fibrillation  7. PE    Patient Active Problem List   Diagnosis   ??? Type II diabetes mellitus with renal manifestations, uncontrolled (CMS-HCC)   ??? Hypertension   ??? History of kidney transplant   ??? Mixed hyperlipidemia   ??? 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 Transplanted kidney   ??? Pulmonary embolism (CMS-HCC)   ??? Edema   ??? Right ventricular dysfunction   ??? Atrial fibrillation (CMS-HCC)   ??? Aftercare following organ transplant   ??? Diverticulosis of colon   ??? Routine general medical examination at a health care facility   ??? Diabetic polyneuropathy associated with type 2 diabetes mellitus (CMS-HCC)   ??? Diabetic ulcer of left foot associated with type 2 diabetes mellitus (CMS-HCC)   ??? Toe amputation status (CMS-HCC)   ??? On statin therapy due to risk of future cardiovascular  event   ??? Left knee pain   ??? BCC (basal cell carcinoma), face   ??? Critical lower limb ischemia   ??? Acquired absence of other toe(s), unspecified side (CMS-HCC)   ??? History of colonic polyps   ??? Non-pressure chronic ulcer of other part of unspecified foot with unspecified severity (CMS-HCC)   ??? 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 Hypoglycemia associated with type 2 diabetes mellitus (CMS-HCC)   ??? PAD (peripheral artery disease) (CMS-HCC)     Past Medical History:   Diagnosis Date   ??? Atrial flutter (CMS-HCC)    ??? Diabetes mellitus (CMS-HCC)    ??? Diabetic nephropathy (CMS-HCC)    ??? Diabetic retinopathy (CMS-HCC)    ??? Hyperlipidemia    ??? Hypertension    ??? Osteomyelitis (CMS-HCC) June 2016   ??? Pulmonary embolism (CMS-HCC) Dec 2015   ??? 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 Stroke (CMS-HCC)    ??? Transplanted kidney 08/21/2011     Medicines:     Current Outpatient Prescriptions   Medication Sig Dispense Refill   ??? amLODIPine (NORVASC) 5 MG tablet Take 5 mg by mouth daily.     ??? aspirin (ECOTRIN) 81 MG tablet Take 81 mg by mouth continuous as needed. Only when flying and/or traveling by car more than 2 hours.     ??? BD ULTRA-FINE NANO PEN NEEDLE 32 gauge x 5/32 Ndle USE UP TO 3 TIMES DAILY 100 each 11   ??? blood sugar diagnostic (FREESTYLE PRECISION NEO STRIPS) Strp Use to test blood sugar 4 times daily. Dx E11.65. 150 each 5   ??? chlorthalidone (HYGROTON) 25 MG tablet TAKE ONE TABLET EVERY DAY 60 tablet 0   ??? cholecalciferol, vitamin D3, (VITAMIN D3) 2,000 unit Tab Take 2,000 Units by mouth daily.      ??? furosemide (LASIX) 40 MG tablet Take 1-2 tablets once daily 180 tablet 3   ??? gabapentin (NEURONTIN) 300 MG capsule TAKE 1 CAPSULE BY MOUTH TWICE DAILY 90 capsule 0   ??? HUMULIN R U-500, CONC, KWIKPEN 500 unit/mL (3 mL) CONCENTRATED injection DIAL TO MAX 100 UNITS BEFORE [facility] AND DINNER.INJECT 30 MINS BEFORE MEALS.MAX OF 300 UNITS DAILY 18 mL 11   ??? hydrocortisone 2.5 % cream   0   ??? irbesartan (AVAPRO) 150 MG tablet Take 1 tablet (150 mg total) by mouth nightly. 90 tablet 3   ??? ketoconazole (NIZORAL) 2 % cream   0   ??? lancets Misc 1 each by Miscellaneous route Four (4) times a day. 150 each 6   ??? magnesium chloride (SLOW_MAG) 64 mg TbEC Take 128 mg by mouth three (3) times a day (at 6am, noon and 6pm).      ??? metoprolol succinate (TOPROL-XL) 200 MG 24 hr tablet TAKE ONE TABLET BY MOUTH TWICE DAILY 180 tablet 3   ??? multivitamin capsule Take 1 capsule by mouth daily.     ??? mycophenolate (MYFORTIC) 180 MG EC tablet Take 3 tablets (540 mg total) by mouth Two (2) times a day. 540 tablet 3   ??? silver sulfaDIAZINE (SILVADENE) 1 % cream Apply topically daily. 50 g 0   ??? tacrolimus (PROGRAF) 1 MG capsule Take 3 capsules (3 mg total) by mouth Two (2) times a day. Z94.0 540 capsule 3   ??? warfarin (COUMADIN) 2.5 MG tablet Take 1 tablet (2.5 mg total) by mouth Every Monday.  30 tablet 2   ??? warfarin (COUMADIN) 5 MG tablet Take 5 mg every day except mondays 30 tablet 6   ??? BD INSULIN SYRINGE ULTRA-FINE 0.3 mL 31 gauge x 15/64 Syrg 1 each by Other route Three (3) times a day. (Patient not taking: Reported on 06/06/2017) 300 Syringe 3   ??? blood-glucose meter kit Use as instructed. 1 each 0   ??? glucagon, human recombinant, (GLUCAGON) 1 mg injection Follow package directions for low blood sugar. (Patient not taking: Reported on 06/06/2017) 1 mg 1   ??? pravastatin (PRAVACHOL) 20 MG tablet Take 1 tablet (20 mg total) by mouth daily. 30 tablet 11     No current facility-administered medications for this visit.      Facility-Administered Medications Ordered in Other Visits   Medication Dose Route Frequency Provider Last Rate Last Dose   ??? bevacizumab    PRN (once a day) Melvyn Novas, MD   2.5 mg at 01/28/17 1610       Social History: reviewed: his wife was kidney donor. rare etoh. no cig. 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     Other past medical history, medications, allergies and problem list are reviewed in the medical record    REVIEW OF SYSTEMS: Pertinent positives and negatives as in HPI, otherwise, all other systems reviewed are negative.     OBJECTIVE  Vitals:    06/27/17 0907   BP: 120/56   Pulse: 62   Weight: (!) 124.9 kg (275 lb 6.4 oz)   Height: 182.9 cm (6' 0.01)     Body mass index is 37.34 kg/m??.    BP Readings from Last 3 Encounters:   06/27/17 120/56   06/12/17 139/73   05/22/17 145/70     Wt Readings from Last 3 Encounters:   06/27/17 (!) 124.9 kg (275 lb 6.4 oz)   05/01/17 (!) 122.5 kg (270 lb)   04/10/17 (!) 122.8 kg (270 lb 11.6 oz)     GENERAL: well appearing  PSYCH: calm, cooperative with full affect  EYES: eomi, sclera anicteric  NECK: thyroid normal size, no nodules  CV: sinus rate, reg rhythm, neither DP nor PT palpable   LUNG: CTA throughout  LE: L foot s/p partial amputation, callous covering L plantar ulceration    Pertinent labs:  Office Visit on 06/27/2017   Component Date Value Ref Range Status   ??? Glucose, POC 06/27/2017 216* 65 - 179 mg/dL Final   Lab on 96/09/5407   Component Date Value Ref Range Status   ??? PT 06/06/2017 24.7* 10.2 - 12.8 sec Final   ??? INR 06/06/2017 2.17   Final   ??? Sodium 05/30/2017 139  135 - 145 mmol/L Final   ??? Potassium 05/30/2017 5.5* 3.5 - 5.0 mmol/L Final   ??? Chloride 05/30/2017 104  98 - 107 mmol/L Final   ??? CO2 05/30/2017 26.0  22.0 - 30.0 mmol/L Final   ??? BUN 05/30/2017 37* 7 - 21 mg/dL Final   ??? Creatinine 05/30/2017 1.36* 0.70 - 1.30 mg/dL Final   ??? BUN/Creatinine Ratio 05/30/2017 27   Final   ??? EGFR MDRD Non Af Amer 05/30/2017 52* >=60 mL/min/1.54m2 Final   ??? EGFR MDRD Af Amer 05/30/2017 >=60  >=60 mL/min/1.15m2 Final   ??? Anion Gap 05/30/2017 9  9 - 15 mmol/L Final   ??? Glucose 05/30/2017 231* 65 - 179 mg/dL Final   ??? Calcium 81/19/1478 9.1  8.5 - 10.2 mg/dL Final   ??? Albumin 29/56/2130 3.9  3.5 -  5.0 g/dL Final   ??? Total Protein 05/30/2017 6.4* 6.5 - 8.3 g/dL Final   ??? Total Bilirubin 05/30/2017 0.9  0.0 - 1.2 mg/dL Final   ??? AST 16/03/9603 30  19 - 55 U/L Final   ??? ALT 05/30/2017 35  19 - 72 U/L Final   ??? Alkaline Phosphatase 05/30/2017 76  38 - 126 U/L Final   ??? Phosphorus 05/30/2017 4.3  2.9 - 4.7 mg/dL Final   ??? Tacrolimus, Timed 05/30/2017 7.5  ng/mL Final   ??? PT 05/30/2017 39.7* 10.2 - 12.8 sec Final   ??? INR 05/30/2017 3.48   Final   ??? WBC 05/30/2017 7.4  4.5 - 11.0 10*9/L Final   ??? RBC 05/30/2017 5.43  4.50 - 5.90 10*12/L Final   ??? HGB 05/30/2017 15.0  13.5 - 17.5 g/dL Final   ??? HCT 54/02/8118 47.5  41.0 - 53.0 % Final   ??? MCV 05/30/2017 87.4  80.0 - 100.0 fL Final   ??? MCH 05/30/2017 27.7  26.0 - 34.0 pg Final   ??? MCHC 05/30/2017 31.6  31.0 - 37.0 g/dL Final   ??? RDW 14/78/2956 14.2  12.0 - 15.0 % Final   ??? MPV 05/30/2017 9.3  7.0 - 10.0 fL Final   ??? Platelet 05/30/2017 217  150 - 440 10*9/L Final   ??? Absolute Neutrophils 05/30/2017 5.4  2.0 - 7.5 10*9/L Final   ??? Absolute Lymphocytes 05/30/2017 1.3* 1.5 - 5.0 10*9/L Final   ??? Absolute Monocytes 05/30/2017 0.5  0.2 - 0.8 10*9/L Final   ??? Absolute Eosinophils 05/30/2017 0.1  0.0 - 0.4 10*9/L Final   ??? Absolute Basophils 05/30/2017 0.0  0.0 - 0.1 10*9/L Final   ??? Large Unstained Cells 05/30/2017 2  0 - 4 % Final   ??? Hemoglobin A1C 05/30/2017 9.8* 4.8 - 5.6 % Final   ??? Estimated Average Glucose 05/30/2017 235  mg/dL Final     Lab Results   Component Value Date    A1C 9.8 (H) 05/30/2017    A1C 9.3 (H) 01/29/2017    A1C 8.3 (H) 10/24/2016    A1C 7.5 (H) 10/01/2016    A1C 8.0 (H) 10/03/2015    A1C 6.7 (H) 08/04/2014    A1C 7.5 07/20/2014    A1C 7.1 (H) 07/04/2014    A1C 7.6 (H) 05/17/2014    A1C 8.0 (H) 11/23/2013    A1C 6.7 (H) 04/07/2012    A1C 7.4 (H) 03/30/2011     No components found for: SODIUM  Lab Results   Component Value Date    K 5.5 (H) 05/30/2017     Lab Results   Component Value Date    CREATININE 1.36 (H) 05/30/2017     Lab Results   Component Value Date    PTH 88.2 (H) 08/08/2016    CALCIUM 9.1 05/30/2017     Lab Results   Component Value Date    ALT 35 05/30/2017    ALT 22 04/21/2015     Lab Results   Component Value Date    CHOL 117 01/29/2017     Lab Results   Component Value Date    LDL 39 (L) 01/29/2017     Lab Results   Component Value Date    HDL 31 (L) 01/29/2017     Lab Results   Component Value Date    TRIG 237 (H) 01/29/2017     Lab Results   Component Value Date    TSH 1.840 07/13/2015  TSH 1.420 07/09/2015    TSH 1.20 07/02/2011     Lab Results   Component Value Date    TSH 1.840 07/13/2015     No results found for: CBC  No results found for: VITAMINB12  Lab Results   Component Value Date    VITD2 <5 08/17/2015    VITD3 42 08/17/2015    VITDTOTAL 33.8 03/01/2017     No results found for: Janeece Fitting, MALBCRERAT  Lab Results   Component Value Date    PROTEINUR 10.1 08/08/2016    PROTEINUR 15 08/05/2014    PCRATIOUR 0.061 08/08/2016    PCRATIOUR 0.116 08/05/2014       IMAGING:

## 2017-06-27 NOTE — Unmapped (Signed)
Freestyle [employer]. See report under media. PP 0800 today. POCT glucose performed. A1c not due today. 216 mg/dL.

## 2017-06-27 NOTE — Unmapped (Addendum)
Although HgbA1c was above goal in December, your glucoses the past several weeks have been in general exceellent, though trend of hyperglycemia at noon, and risk for hypoglycemia overnight -- assume this is from stacking of insulin doses as day goes one.  To address this, increase bfast coverage, and decrease lunch and dinner coverage.     (9-10am) Bfast DIAL UP TO 105 UNITS (21 CLICKS)   ????????????????????????Reduce to 70 units if premeal glucose 75-100   ????????????????????????Reduce 60 units if glucose <??75 AND you are getting ready to eat a full meal   (2pm) Lunch DIAL UP TO 60 UNITS (12 CLICKS)  For PNButter, reduce to 50 units (10 clicks)  (7ish) Dinner: DIAL UP TO 40 UNITS (8 CLICKS)  For seafood, reduce to 30units (6 clicks)  ????????????????????????+45 ????units for glucose 201- 300   ????????????????????????+50 ????units 301- 400   If glucose <??100 at meal, reduce dose by 75%, and if glucose is <??75, reduce dose 50%.     Schedule nurse visit for download of Freestyle Libre in 3 months.   Klee Kolek 6 months.     I will document for medicare to continue Freestyle [name]

## 2017-06-28 NOTE — Unmapped (Signed)
[employer] [facility] forms placed in your box. Please fill out

## 2017-07-02 ENCOUNTER — Ambulatory Visit: Admit: 2017-07-02 | Discharge: 2017-07-03 | Payer: MEDICARE

## 2017-07-02 DIAGNOSIS — Z0000 Encounter for general adult medical examination without abnormal findings: Secondary | ICD-10-CM

## 2017-07-02 DIAGNOSIS — Z940 Kidney transplant status: Secondary | ICD-10-CM

## 2017-07-02 DIAGNOSIS — E1129 Type 2 diabetes mellitus with other diabetic kidney complication: Secondary | ICD-10-CM

## 2017-07-02 DIAGNOSIS — I159 Secondary hypertension, unspecified: Secondary | ICD-10-CM

## 2017-07-02 DIAGNOSIS — E782 Mixed hyperlipidemia: Secondary | ICD-10-CM

## 2017-07-02 DIAGNOSIS — L97529 Non-pressure chronic ulcer of other part of left foot with unspecified severity: Secondary | ICD-10-CM

## 2017-07-02 DIAGNOSIS — I2699 Other pulmonary embolism without acute cor pulmonale: Secondary | ICD-10-CM

## 2017-07-02 DIAGNOSIS — E11621 Type 2 diabetes mellitus with foot ulcer: Secondary | ICD-10-CM

## 2017-07-02 DIAGNOSIS — E1165 Type 2 diabetes mellitus with hyperglycemia: Secondary | ICD-10-CM

## 2017-07-02 LAB — PHOSPHORUS: Phosphate:MCnc:Pt:Ser/Plas:Qn:: 3.8

## 2017-07-02 LAB — CREATININE: Creatinine:MCnc:Pt:Ser/Plas:Qn:: 1.72 — ABNORMAL HIGH

## 2017-07-02 LAB — LIPID PANEL
CHOLESTEROL/HDL RATIO SCREEN: 5 — ABNORMAL HIGH (ref <5.0)
CHOLESTEROL: 156 mg/dL (ref 100–199)
HDL CHOLESTEROL: 31 mg/dL — ABNORMAL LOW (ref 40–59)
LDL CHOLESTEROL CALCULATED: 77 mg/dL (ref 60–99)
TRIGLYCERIDES: 242 mg/dL — ABNORMAL HIGH (ref 1–149)
VLDL CHOLESTEROL CAL: 48.4 mg/dL — ABNORMAL HIGH (ref 12–42)

## 2017-07-02 LAB — FASTING

## 2017-07-02 LAB — COMPREHENSIVE METABOLIC PANEL
ALBUMIN: 4.1 g/dL (ref 3.5–5.0)
ALKALINE PHOSPHATASE: 75 U/L (ref 38–126)
ALT (SGPT): 31 U/L (ref 19–72)
ANION GAP: 6 mmol/L — ABNORMAL LOW (ref 9–15)
AST (SGOT): 25 U/L (ref 19–55)
BILIRUBIN TOTAL: 0.7 mg/dL (ref 0.0–1.2)
BLOOD UREA NITROGEN: 44 mg/dL — ABNORMAL HIGH (ref 7–21)
BUN / CREAT RATIO: 26
CALCIUM: 9.5 mg/dL (ref 8.5–10.2)
CHLORIDE: 102 mmol/L (ref 98–107)
CO2: 30 mmol/L (ref 22.0–30.0)
EGFR MDRD AF AMER: 48 mL/min/{1.73_m2} — ABNORMAL LOW (ref >=60)
EGFR MDRD NON AF AMER: 40 mL/min/{1.73_m2} — ABNORMAL LOW (ref >=60)
GLUCOSE RANDOM: 190 mg/dL — ABNORMAL HIGH (ref 65–99)
POTASSIUM: 5.9 mmol/L — ABNORMAL HIGH (ref 3.5–5.0)
PROTEIN TOTAL: 6.7 g/dL (ref 6.5–8.3)
SODIUM: 138 mmol/L (ref 135–145)

## 2017-07-02 LAB — CBC W/ AUTO DIFF
BASOPHILS ABSOLUTE COUNT: 0 10*9/L (ref 0.0–0.1)
EOSINOPHILS ABSOLUTE COUNT: 0.1 10*9/L (ref 0.0–0.4)
HEMOGLOBIN: 15.7 g/dL (ref 13.5–17.5)
LARGE UNSTAINED CELLS: 1 % (ref 0–4)
LYMPHOCYTES ABSOLUTE COUNT: 1 10*9/L — ABNORMAL LOW (ref 1.5–5.0)
MEAN CORPUSCULAR HEMOGLOBIN CONC: 32.6 g/dL (ref 31.0–37.0)
MEAN CORPUSCULAR HEMOGLOBIN: 28.2 pg (ref 26.0–34.0)
MEAN CORPUSCULAR VOLUME: 86.6 fL (ref 80.0–100.0)
MEAN PLATELET VOLUME: 8.6 fL (ref 7.0–10.0)
MONOCYTES ABSOLUTE COUNT: 0.5 10*9/L (ref 0.2–0.8)
NEUTROPHILS ABSOLUTE COUNT: 5.6 10*9/L (ref 2.0–7.5)
PLATELET COUNT: 234 10*9/L (ref 150–440)
RED BLOOD CELL COUNT: 5.55 10*12/L (ref 4.50–5.90)

## 2017-07-02 LAB — HEMOGLOBIN A1C
ESTIMATED AVERAGE GLUCOSE: 203 mg/dL
Hemoglobin A1c/Hemoglobin.total:MFr:Pt:Bld:Qn:: 8.7 — ABNORMAL HIGH

## 2017-07-02 LAB — URINALYSIS WITH CULTURE REFLEX
BILIRUBIN UA: NEGATIVE
BLOOD UA: NEGATIVE
KETONES UA: NEGATIVE
LEUKOCYTE ESTERASE UA: NEGATIVE
NITRITE UA: NEGATIVE
PH UA: 5.5 (ref 5.0–9.0)
RBC UA: <1 /HPF (ref <3)
SPECIFIC GRAVITY UA: 1.015 (ref 1.003–1.030)
SQUAMOUS EPITHELIAL: <1 /HPF (ref 0–5)
WBC UA: 1 /HPF (ref <2)

## 2017-07-02 LAB — GLUCOSE UA: Lab: NEGATIVE

## 2017-07-02 LAB — PROSTATE SPECIFIC ANTIGEN: Prostate specific Ag:MCnc:Pt:Ser/Plas:Qn:: 0.73

## 2017-07-02 LAB — PARATHYROID HORMONE INTACT: Parathyrin.intact:MCnc:Pt:Ser/Plas:Qn:: 129.2 — ABNORMAL HIGH

## 2017-07-02 LAB — THYROID STIMULATING HORMONE: Thyrotropin:ACnc:Pt:Ser/Plas:Qn:: 2.202

## 2017-07-02 LAB — TACROLIMUS BLOOD: Lab: 8.5

## 2017-07-02 LAB — LYMPHOCYTES ABSOLUTE COUNT: Lab: 1 — ABNORMAL LOW

## 2017-07-02 LAB — PTH: CALCIUM: 9.5 mg/dL (ref 8.5–10.2)

## 2017-07-02 LAB — PROTIME: Lab: 24.8 — ABNORMAL HIGH

## 2017-07-02 LAB — PROTIME-INR: PROTIME: 24.8 s — ABNORMAL HIGH (ref 10.2–12.8)

## 2017-07-02 NOTE — Unmapped (Signed)
Tolerating statin.  Labs today.

## 2017-07-02 NOTE — Unmapped (Signed)
INR today.    REpeat monthly.

## 2017-07-02 NOTE — Unmapped (Signed)
Check labs today and forward nephrology.  Renal function has been stable.

## 2017-07-02 NOTE — Unmapped (Signed)
Patient ID: David Ellis is a 72 y.o. male who presents for eval of medical issues.      Assessment/Plan:        Type II diabetes mellitus with renal manifestations, uncontrolled (CMS-HCC)  Per endocrinology; Check A1C and TSH today.  Repeat A1C and lipid and ua every three months.  He is aware of the high a1c, but reports that [place] Insulin is prohibitively expensive.  Has been offered insulin pump but he declined.      Transplanted kidney  Check labs today and forward nephrology.  Renal function has been stable.    Routine general medical examination at a health care facility  Need to discuss colonoscopy again in the future; lots of medical things goin gon right now.  Defer to later this year.  PSA today.      Pulmonary embolism (CMS-HCC)  INR today.    REpeat monthly.    Mixed hyperlipidemia  Tolerating statin.  Labs today.    Hypertension  Outstanding control of BP.    RTC in three months.    Orders Placed This Encounter   Procedures   ??? HM DIABETES FOOT EXAM   ??? Comprehensive Metabolic Panel   ??? Lipid Panel   ??? CBC w/ Differential   ??? Hemoglobin A1c   ??? PSA, Diagnostic   ??? Urinalysis with Culture Reflex   ??? PTH   ??? PT-INR   ??? Tacrolimus Level, Timed ([place])   ??? Phosphorus Level   ??? TSH       -- Discussed the new prescription noted above, including potential side effects, drug interactions, instructions for taking the medication, and the consequences of not taking it.  -- Patient verbalized an understanding of today's assessment and recommendations, as well as the purpose of ongoing medications.    Subjective:     Current Health Status  Patient Active Problem List    Diagnosis Date Noted   ??? Diabetic ulcer of left foot associated with type 2 diabetes mellitus (CMS-HCC) 07/03/2014     Priority: High   ??? Routine general medical examination at a health care facility 11/17/2013     Priority: High   ??? Atrial fibrillation (CMS-HCC) 07/06/2013     Priority: High   ??? Right ventricular dysfunction 06/30/2013 Priority: High   ??? Pulmonary embolism (CMS-HCC) 05/23/2013     Priority: High   ??? Transplanted kidney      Priority: High   ??? Mixed hyperlipidemia 10/30/2011     Priority: High   ??? Type II diabetes mellitus with renal manifestations, uncontrolled (CMS-HCC) 03/26/2011     Priority: High   ??? Hypertension 10/10/2010     Priority: High   ??? PAD (peripheral artery disease) (CMS-HCC) 02/12/2017   ??? Hypoglycemia associated with type 2 diabetes mellitus (CMS-HCC) 09/03/2016   ??? Critical lower limb ischemia 08/08/2015   ??? BCC (basal cell carcinoma), face 07/18/2015   ??? Left knee pain 11/09/2014   ??? On statin therapy due to risk of future cardiovascular event 10/04/2014   ??? Non-pressure chronic ulcer of other part of unspecified foot with unspecified severity (CMS-HCC) 08/24/2014   ??? Toe amputation status (CMS-HCC) 08/10/2014   ??? Acquired absence of other toe(s), unspecified side (CMS-HCC) 08/10/2014   ??? Diabetic polyneuropathy associated with type 2 diabetes mellitus (CMS-HCC) 03/15/2014   ??? Aftercare following organ transplant 08/27/2013   ??? Edema 06/30/2013   ??? 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 History of kidney transplant 10/30/2011   ???  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 Diverticulosis of colon 03/09/2011   ??? History of colonic polyps 03/09/2011      Here today for eval of medical issues.    No specific issues.    Reports that his bp has been doing well and does not believe he is taking amlodipine.  BP has been outstanding.      He reports that his sugars have been doing well recently, but more frequent hypoglycemia.  He continues to see Dr. Tiburcio Pea for this.  Last time he saw Dr. Tiburcio Pea last week and will see them again. He is aware of the high a1c, but reports that [place] Insulin is prohibitively expensive.  Has been offered insulin pump but he declined.      'Notes as small right sided conjunctival hemorrhoage this morning, but had two days of solid sneezing.  Sneezing has resolved, but hemorrhage which is normal.     Every three months, lipid and A1C, urinalysis, PTH starting in March  Every month CMP, PHos, tacrolimus, INR, CBC  Just thyroid today.  AND PSA>    Allergies   Allergen Reactions   ??? Enalapril Other (See Comments)     cough  cough   ??? Grass Pollen-[place], Standard      Other reaction(s): ITCHING   ??? Levofloxacin Other (See Comments)     Other reaction(s): Unknown  Unknown   ??? Lidocaine Other (See Comments)     I don't remember States is fine with Bupivicaine   ??? Penicillins      As child  Other reaction(s): UNKNOWN.   (UPDATE: Tolerated amp/sulbactam without side effects during 10/2014 hospitalization)   ??? Egg Derived Rash     rash  rash   ??? Epinephrine Palpitations     Heart palpitations   ??? Lisinopril Rash   ??? Mepivacaine Hcl Palpitations     unspecified       Current Outpatient Prescriptions   Medication Sig Dispense Refill   ??? BD INSULIN SYRINGE ULTRA-FINE 0.3 mL 31 gauge x 15/64 Syrg 1 each by Other route Three (3) times a day. 300 Syringe 3   ??? BD ULTRA-FINE NANO PEN NEEDLE 32 gauge x 5/32 Ndle USE UP TO 3 TIMES DAILY 100 each 11   ??? blood sugar diagnostic (FREESTYLE PRECISION NEO STRIPS) Strp Use to test blood sugar 4 times daily. Dx E11.65. 150 each 5   ??? chlorthalidone (HYGROTON) 25 MG tablet TAKE ONE TABLET EVERY DAY 60 tablet 0   ??? cholecalciferol, vitamin D3, (VITAMIN D3) 2,000 unit Tab Take 2,000 Units by mouth daily.      ??? furosemide (LASIX) 40 MG tablet Take 1-2 tablets once daily 180 tablet 3   ??? gabapentin (NEURONTIN) 300 MG capsule TAKE 1 CAPSULE BY MOUTH TWICE DAILY 90 capsule 0   ??? glucagon, human recombinant, (GLUCAGON) 1 mg injection Follow package directions for low blood sugar. 1 mg 1   ??? HUMULIN R U-500, CONC, KWIKPEN 500 unit/mL (3 mL) CONCENTRATED injection DIAL TO MAX 100 UNITS BEFORE [facility] AND DINNER.INJECT 30 MINS BEFORE MEALS.MAX OF 300 UNITS DAILY 18 mL 11   ??? irbesartan (AVAPRO) 150 MG tablet Take 1 tablet (150 mg total) by mouth nightly. 90 tablet 3   ??? ketoconazole (NIZORAL) 2 % cream   0   ??? lancets Misc 1 each by Miscellaneous route Four (4) times a day. 150 each 6   ??? magnesium chloride (SLOW_MAG) 64 mg  TbEC Take 128 mg by mouth three (3) times a day (at 6am, noon and 6pm).      ??? metoprolol succinate (TOPROL-XL) 200 MG 24 hr tablet TAKE ONE TABLET BY MOUTH TWICE DAILY 180 tablet 3   ??? multivitamin capsule Take 1 capsule by mouth daily.     ??? mycophenolate (MYFORTIC) 180 MG EC tablet Take 3 tablets (540 mg total) by mouth Two (2) times a day. 540 tablet 3   ??? pravastatin (PRAVACHOL) 20 MG tablet Take 1 tablet (20 mg total) by mouth daily. 30 tablet 11   ??? tacrolimus (PROGRAF) 1 MG capsule Take 3 capsules (3 mg total) by mouth Two (2) times a day. Z94.0 540 capsule 3   ??? warfarin (COUMADIN) 2.5 MG tablet Take 1 tablet (2.5 mg total) by mouth Every Monday. 30 tablet 2   ??? warfarin (COUMADIN) 5 MG tablet Take 5 mg every day except mondays 30 tablet 6   ??? aspirin (ECOTRIN) 81 MG tablet Take 81 mg by mouth continuous as needed. Only when flying and/or traveling by car more than 2 hours.     ??? blood-glucose meter kit Use as instructed. 1 each 0   ??? hydrocortisone 2.5 % cream   0   ??? silver sulfaDIAZINE (SILVADENE) 1 % cream Apply topically daily. (Patient not taking: Reported on 07/02/2017) 50 g 0     No current facility-administered medications for this visit.      Facility-Administered Medications Ordered in Other Visits   Medication Dose Route Frequency Provider Last Rate Last Dose   ??? bevacizumab    PRN (once a day) Melvyn Novas, MD   2.5 mg at 01/28/17 0750       Past Medical History:   Diagnosis Date   ??? Atrial flutter (CMS-HCC)    ??? Diabetes mellitus (CMS-HCC)    ??? Diabetic nephropathy (CMS-HCC)    ??? Diabetic retinopathy (CMS-HCC)    ??? Hyperlipidemia    ??? Hypertension    ??? Osteomyelitis (CMS-HCC) June 2016   ??? Pulmonary embolism (CMS-HCC) Dec 2015   ??? 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 Stroke (CMS-HCC)    ??? 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 NEPHRECTOMY TRANSPLANTED ORGAN Right     LURD - spouse received in 2013.   ??? PR AMPUTATION METATARSAL+TOE,SINGLE Left 07/06/2014    Procedure: AMPUTATION, METATARSAL, WITH TOE SINGLE;  Surgeon: Marion Downer, MD;  Location[facility] MAIN [facility];  Service: Vascular   ??? PR AMPUTATION METATARSAL+TOE,SINGLE Left 10/28/2014    Procedure: AMPUTATION, METATARSAL, WITH TOE SINGLE;  Surgeon: Maple Mirza, MD;  Location: MAIN OR [facility];  Service: Vascular   ??? PR VITRECTOMY,PANRETINAL LASER RX Right 11/26/2016    Procedure: VITRECTOMY, MECHANICAL, PARS PLANA APPROACH; WITH ENDOLASER PANRETINAL PHOTOCOAGULATION;  Surgeon: Melvyn Novas, MD;  Location: [facility];  Service: Ophthalmology   ??? PR VITRECTOMY,PANRETINAL LASER RX Left 01/28/2017    Procedure: VITRECTOMY, MECHANICAL, PARS PLANA APPROACH; WITH ENDOLASER PANRETINAL PHOTOCOAGULATION;  Surgeon: Melvyn Novas, MD;  [facility]: [facility] OR [facility];  Service: Ophthalmology   ??? SKIN BIOPSY         Family History   Problem Relation Age of Onset   ??? Kidney disease Mother    ??? Diabetes Mother    ??? Kidney disease Father    ??? Kidney disease  Maternal Grandmother    ??? Diabetes Maternal Grandmother        Social History   Substance Use Topics   ??? Smoking status: Never Smoker   ??? Smokeless tobacco: Never Used   ??? Alcohol use No             Objective:       Vital Signs  BP 112/60 (BP Site: L Arm, BP Position: Sitting)  - Pulse 56  - Ht 185.4 cm (6' 1)  - Wt (!) 124.4 kg (274 lb 3.2 oz)  - SpO2 97%  - BMI 36.18 kg/m??      Exam  Physical Exam:    Well developed, well nourished male in no acute distress.      Vitals:    07/02/17 0810   BP: 112/60   Pulse: 56   SpO2: 97%     LUNGS:  CTA  CARDIOVASCULAR:  RRR without murmurs, or gallops, or rubs.  Pulses 2+ bilaterally and symmetric.    ABDOMEN:  NABS/ NT/ND/soft.  NO hsm nor abdominal masses.    EXTREMITIES:  No c/c/e.  No ulcers or abrasions.  Surgical sites without infection.  Feet are dry.

## 2017-07-02 NOTE — Unmapped (Signed)
Check labs today.    Please confirm that you are no longer taking amlodipine/norvasc; check at home.    Labs reordered.      Back with me every t hree months.

## 2017-07-02 NOTE — Unmapped (Signed)
Outstanding control of BP.

## 2017-07-02 NOTE — Unmapped (Addendum)
Need to discuss colonoscopy again in the future; lots of medical things goin gon right now.  Defer to later this year.  PSA today.

## 2017-07-02 NOTE — Unmapped (Signed)
Per endocrinology; Check A1C and TSH today.  Repeat A1C and lipid and ua every three months.  He is aware of the high a1c, but reports that [place] Insulin is prohibitively expensive.  Has been offered insulin pump but he declined.

## 2017-07-03 NOTE — Unmapped (Signed)
Completed.

## 2017-07-03 NOTE — Unmapped (Signed)
Patient notified and voiced understanding.    Anticoagulation tracker updated.

## 2017-07-03 NOTE — Unmapped (Signed)
-----   Message from Sharlee Blew, MD sent at 07/03/2017  7:56 AM EST -----  Call and let patient know all labs stable.    Creatinine sl worse; repeat in a month.    INR ok.  Same dose; repeat in a month.    A1C improved.    Copies sent o nephrology and endocrinology.    [name], M.D.     [facility] at [facility]   [address]  [address]  [place], [place]  [address]  [phone]

## 2017-07-08 ENCOUNTER — Ambulatory Visit: Admit: 2017-07-08 | Discharge: 2017-07-09 | Payer: MEDICARE

## 2017-07-08 DIAGNOSIS — E11621 Type 2 diabetes mellitus with foot ulcer: Principal | ICD-10-CM

## 2017-07-08 DIAGNOSIS — I739 Peripheral vascular disease, unspecified: Secondary | ICD-10-CM

## 2017-07-08 DIAGNOSIS — L97522 Non-pressure chronic ulcer of other part of left foot with fat layer exposed: Secondary | ICD-10-CM

## 2017-07-08 NOTE — Unmapped (Signed)
Diabetic Foot Ulcer: Care Instructions  Your Care Instructions  Diabetes can damage the nerve endings and blood vessels in your feet. That means you are less likely to notice when your feet are injured. A small skin problem like a callus, blister, or cracked skin can turn into a larger sore, called a foot ulcer. Foot ulcers form most often on the pad (ball) of the foot or the bottom of the big toe. You can also get them on the top and bottom of each toe.  Foot ulcers can get infected. If the infection is severe, then tissue in the foot can die. This is called gangrene. In that case, one or more of the toes, part or all of the foot, and sometimes part of the leg may have to be removed (amputated).  Your doctor may have removed the dead tissue and cleaned the ulcer. Your foot wound may be wrapped in a protective bandage. It is very important to keep your weight off your injured foot. After a foot ulcer has formed, it will not heal as long as you keep putting weight on the area.  Always get early treatment for foot problems. A minor irritation can lead to a major problem if it's not taken care of soon.  Follow-up care is a key part of your treatment and safety. Be sure to make and go to all appointments, and call your doctor if you are having problems. It's also a good idea to know your test results and keep a list of the medicines you take.  How can you care for yourself at home?  ?? Follow your doctor's instructions about keeping pressure off the foot ulcer. You may need to use crutches or a wheelchair. Or you may wear a cast or a walking boot.  ?? Follow your doctor's instructions on how to clean the ulcer and change the bandage.  ?? If your doctor prescribed antibiotics, take them as directed. Do not stop taking them just because you feel better. You need to take the full course of antibiotics.  To prevent foot ulcers  ?? Keep your blood sugar close to normal by watching what and how much you eat. Track your blood sugar, take medicines if prescribed, and get regular exercise.  ?? Do not smoke. Smoking affects blood flow and can make foot problems worse. If you need help quitting, talk to your doctor about stop-smoking programs and medicines. These can increase your chances of quitting for good.  ?? Do not go barefoot. Protect your feet by wearing shoes that fit well. Choose shoes that are made of materials that are flexible and breathable, such as leather or cloth.  ?? Inspect your feet daily for blisters, cuts, cracks, or sores. If you can't see well, use a mirror or have someone help you.  ?? Have your doctor check your feet during each visit. If you have a foot problem, see your doctor. Do not try to treat your foot problem on your own. Home remedies or treatments that you can buy without a prescription (such as corn removers) can be harmful.  When should you call for help?  Call your doctor now or seek immediate medical care if:  ?? ?? You have symptoms of infection, such as:  ? Increased pain, swelling, warmth, or redness.  ? Red streaks leading from the area.  ? Pus draining from the area.  ? A fever.   ??Watch closely for changes in your health, and be sure to contact your  doctor if:  ?? ?? You have a new problem with your feet, such as:  ? A new sore or ulcer.  ? A break in the skin that is not healing after several days.  ? Bleeding corns or calluses.  ? An ingrown toenail.   ?? ?? You do not get better as expected.   Where can you learn more?  Go to [facility] at [email or web address]  Select Preferences in the upper right hand corner, then select Health Library under Resources. Enter T131 in the search box to learn more about Diabetic Foot Ulcer: Care Instructions.  Current as of: December 26, 2016  Content Version: 11.9  ?? 2006-2018 [employer]. Care instructions adapted under license by [facility]. If you have questions about a medical condition or this instruction, always ask your healthcare professional. [employer] Inc[employer] disclaims any warranty or liability for your use of this information.

## 2017-07-08 NOTE — Unmapped (Signed)
Patient Active Problem List   Diagnosis   ??? Type II diabetes mellitus with renal manifestations, uncontrolled (CMS-HCC)   ??? Hypertension   ??? History of kidney transplant   ??? Mixed hyperlipidemia   ??? 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 Transplanted kidney   ??? Pulmonary embolism (CMS-HCC)   ??? Edema   ??? Right ventricular dysfunction   ??? Atrial fibrillation (CMS-HCC)   ??? Aftercare following organ transplant   ??? Diverticulosis of colon   ??? Routine general medical examination at a health care facility   ??? Diabetic polyneuropathy associated with type 2 diabetes mellitus (CMS-HCC)   ??? Diabetic ulcer of left foot associated with type 2 diabetes mellitus (CMS-HCC)   ??? Toe amputation status (CMS-HCC)   ??? On statin therapy due to risk of future cardiovascular event   ??? Left knee pain   ??? BCC (basal cell carcinoma), face   ??? Critical lower limb ischemia   ??? Acquired absence of other toe(s), unspecified side (CMS-HCC)   ??? History of colonic polyps   ??? Non-pressure chronic ulcer of other part of unspecified foot with unspecified severity (CMS-HCC)   ??? 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 Hypoglycemia associated with type 2 diabetes mellitus (CMS-HCC)   ??? PAD (peripheral artery disease) (CMS-HCC)     David Ellis is a pleasant 72 year old gentleman with the above medical problems who returns for routine follow-up.  He has a history of a left diabetic foot ulcer at the plantar lateral portion of his foot.  He has a history of fourth and fifth toe amputation at separate encounters and metatarsal head resection.  The wound is no longer draining and he has gotten his diabetic shoes with custom inserts.  The inserts have a carbon fiber footplate to prevent the foot from rolling laterally.  He has a significant decrease in the amount of callus that is formed.  No recent fevers or chills or night sweats.  No other new concerns.    Physical exam    General: 72 year old gentleman in no apparent distress.    Blood pressure 155/65, pulse 58, temperature 36.4 ??C (97.5 ??F), temperature source Temporal.    Wound 07/19/15 left lateral foot scattered (Active)   Wound Status Not Healed 06/12/2017  8:03 AM   Pain 0 06/12/2017  8:03 AM   Dressing Status      No dressing 07/08/2017  2:50 PM   Wound Length (cm)      0 07/08/2017  2:50 PM   Wound Width (cm)      0 07/08/2017  2:50 PM   Wound Depth (cm)      0 07/08/2017  2:50 PM   Area (sq cm) (Calculated) 0 07/08/2017  2:50 PM   Volume (mL) (Calculated) 0 07/08/2017  2:50 PM   Odor None 06/12/2017  8:03 AM   Margins Undefined edges 06/12/2017  8:03 AM   Peri-wound Assessment      Clean;Dry;Callus 06/12/2017  8:03 AM   Encounter Subsequent 06/12/2017  8:03 AM   Granulation % 76-100% 06/12/2017  8:03 AM   Exudate Type      Sero-sanguineous 06/12/2017  8:03 AM   Exudate Amnt      Scant 06/12/2017  8:03 AM   Thickness Full Thickness 06/12/2017  8:03 AM   Paring No 06/12/2017  8:03 AM   Texture Callus 06/12/2017  8:03 AM   Moisture Normal For Patient 06/12/2017  8:03 AM  Hypergranuation No 06/12/2017  8:03 AM   Tunneling      No 06/12/2017  8:03 AM   Undermining     No 06/12/2017  8:03 AM   Sinus Tract No 06/12/2017  8:03 AM   Treatments Cleansed/Irrigation 06/12/2017  8:03 AM   Picture Taken Yes 06/12/2017  8:03 AM   Dressing Other (Comment) 06/12/2017  8:31 AM   Length (Pre Debridement) 0.1 cm 06/12/2017  8:31 AM   Width (Pre Debridement) 0.1 cm 06/12/2017  8:31 AM   Depth (Pre Debridement) 0.1 cm 06/12/2017  8:31 AM   Pre-Procedure Pain 0 06/12/2017  8:31 AM   Time Out Taken Yes 06/12/2017  8:31 AM   Instrument Used scalpel 06/12/2017  8:31 AM   Tissue/Material Removed non-viable;callus 06/12/2017  8:31 AM   Bleeding minimal 06/12/2017  8:31 AM   Bleeding controlled with pressure 06/12/2017  8:31 AM   Specimen Taken none 06/12/2017  8:31 AM   Type of Debridement selective 06/12/2017  8:31 AM   Level of Debridement skin, dermis 06/12/2017  8:31 AM   Procedural Pain 0 06/12/2017  8:31 AM   Length (Post-Debridement) 0.2 cm 06/12/2017 8:31 AM   Width (Post Debridement) 0.2 cm 06/12/2017  8:31 AM   Depth (Post-Debridement) 0.1 cm 06/12/2017  8:31 AM   Post Procedural Pain 0 06/12/2017  8:31 AM   Area(Pre-Debridement) 0.01 sq cm 06/12/2017  8:31 AM   Area(Post-Debridement) 0.04 sq cm 06/12/2017  8:31 AM   Volume(Pre-Debridement) 0 sq cm 06/12/2017  8:31 AM   Volume(Post-Debridement) 0 sq cm 06/12/2017  8:31 AM       Wound 08/25/15 Foot Left Bottom Lateral Fissure (Active)       Wound 09/14/16 left lateral foot (Active)     Extremities: Brisk audible Doppler signals by handheld Doppler.  Very minimal calluses overlying the plantar lateral left foot where the previous ulcer had been.  I do not feel any soft tissue defect to suggest an ulcer underneath the callus.  Benign paring of the callus has been performed with a #10 scalpel and there is no further ulcer remaining.  Healthy skin was encountered and there was no bleeding at the time of the procedure.  No evidence of acute infection and again all ulcers have resolved.    Impression: Healed left diabetic foot ulcer    Treatment plan    1.  I think at this point a good thick emollient that is applied at least twice per day could be utilized.  I do not think that he needs any further primary dressings such as silver gel or any other antibiotic creams.    2.  He has excellent appearing shoes and orthotics which appear to stabilize his foot.  I was able to watch him walk out of the clinic and his foot did not tend to roll laterally at all.    3.  We will see him back in 3 weeks since he is going on a trip at which point he is going to be doing a good bit of walking.  We will check on him briefly prior to his trip to make sure that there are no problems prior to leaving.  If everything is good at his next appointment then we can see him back in 6 weeks to 2 months.    4.  Return precautions have been reviewed in detail.

## 2017-07-09 ENCOUNTER — Ambulatory Visit: Admit: 2017-07-09 | Discharge: 2017-07-10 | Payer: MEDICARE | Attending: Internal Medicine | Primary: Internal Medicine

## 2017-07-09 DIAGNOSIS — I10 Essential (primary) hypertension: Principal | ICD-10-CM

## 2017-07-09 DIAGNOSIS — I519 Heart disease, unspecified: Secondary | ICD-10-CM

## 2017-07-09 DIAGNOSIS — I4891 Unspecified atrial fibrillation: Secondary | ICD-10-CM

## 2017-07-09 LAB — GLUCOSE: Lab: 248 — ABNORMAL HIGH

## 2017-07-09 LAB — BASIC METABOLIC PANEL
BUN / CREAT RATIO: 28.8 ratio — ABNORMAL HIGH (ref 12.0–25.0)
CALCIUM: 8.9 mg/dL (ref 8.5–10.5)
CHLORIDE: 104 mmol/L (ref 95–110)
CREATININE: 1.7 mg/dL — ABNORMAL HIGH (ref 0.5–1.5)
EGFR: 43 mL/min/{1.73_m2} — ABNORMAL LOW
GLUCOSE: 248 mg/dL — ABNORMAL HIGH (ref 60.00–100.00)
POTASSIUM: 4.6 mmol/L (ref 3.5–5.1)
SODIUM: 139 mmol/L (ref 136–145)

## 2017-07-09 MED ORDER — PRAVASTATIN 20 MG TABLET
ORAL_TABLET | Freq: Every day | ORAL | 3 refills | 0 days | Status: CP
Start: 2017-07-09 — End: 2018-03-25

## 2017-07-09 NOTE — Unmapped (Signed)
RESTART pravastatin

## 2017-07-10 NOTE — Unmapped (Signed)
Patient does not need a refill of specialty medication at this time. Moving specialty refill reminder call to appropriate date and removed call attempt data. Spoke with patient and he states he has enough medication for over 2 weeks and does not need any refills at this time.

## 2017-07-10 NOTE — Unmapped (Signed)
Assessment and Plan:   David Ellis is a 72 y.o. male with a history of atrial fibrillation, hypertension, atrial flutter status post ablation, and pulmonary embolism status post catheter directed lytics 05/2013 with subsequent RV dysfunction who presents in clinic today for follow-up.    1. Atrial fibrillation  Symptomatically doing well. Continue metoprolol and warfarin.  We had previously discussed anticoagulation options and his preference is to remain on warfarin.    2. Hypertension, hyperkalemia  Blood pressure is well-controlled.  However, I am a bit concerned by his potassium trend recently.  He was fairly hyperkalemic last week.  I discussed this with the patient.  Plan to repeat lab work today.  If her potassium remains elevated, we may need to adjust his ARB.  He has follow-up next week with Dr. Arvin Ellis.  Plan to message for if adjustments may be warranted.    3. Hyperlipidemia  We reviewed his recent lipid profile which demonstrates worsened control recently.  He has gained some weight which he attributes to being less active--he has had some setbacks with wound care over the past 6 months.  I had considered increasing his dose of pravastatin.  However, in speaking with him and his wife today, it sounds as though he has inadvertently stopped taking the pravastatin.  In particular, he thought that it was an ARB.  When we had stopped valsartan and switched him to irbesartan at his last visit, he had also inadvertently stopped pravastatin.  I reviewed the difference with him and his wife today.  He will restart the drug.  Previous non-HDL have been well controlled less than 100.  I am hopeful his triglycerides will come down if he is able to lose weight and improve control of his diabetes.  I discussed this with the patient today who communicated understanding.       I personally spent more than 25 minutes with the patient and greater than 50% of the time was spent in counseling and coordination of care regarding the above diagnoses.       ADDENDUM:    Repeat potassium today much better, we will not make any adjustments to his irbesartan.      David Francois, MD  [facility]  Pager [phone]      Subjective:   PCP: David Addison, MD  Patient: David Ellis  DOB: [birth year]    Reason for visit:  Afib, HTN, RV failure  HPI: David Ellis is a 72 y.o. male with a history of atrial fibrillation, hypertension, atrial flutter status post ablation, and pulmonary embolism status post catheter directed lytics 05/2013 with subsequent RV dysfunction who presents in clinic today for follow-up. Since his last visit, he overall reports doing well from the cardiovascular standpoint.  He has not noticed any problems with palpitations or atrial fibrillation.  He also denies any chest pain or shortness of breath.  He has occasional discomfort near his left shoulder that seems to be worse when he is using his arm.  He otherwise denies fevers, nausea, or other complaints today.  Of note, he admits he has gained some weight recently.  Because of the setback he had with his foot last year, he had been off of his feet for quite some time and had limited exercise capacity.  He has now recovered and is hopeful to be more active in this year.    ______________________________________________________________________  Pertinent Medical History, Cardiovascular History & Procedures:    Pertinent PMH:   ??  AFib  ?? Typical atrial flutter s/p ablation 06/2011  ?? DM  ?? 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 ESRD s/p transplant  ?? HTN  ?? OSA on CPAP  ?? Skin CA  ?? PE s/p catheter directed lytics 05/2013  ?? 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 Wedge defects in left lung without any thrombus visualized, could represent chronic changes or small subsegmental PEs.    Risk Factors: HTN, DM     Cath / PCI:   ?? None    CV Surgery:   ?? None    EP Procedures and Devices:   ?? None    Non-Invasive Evaluation(s):   ?? TTE 3/17: Normal EF, LVH, grade 1 diastolic dysfunction  ?? Ziopatch 2/16: Rare SVE and VE. 29 episodes of SVT, longest episode lasting 20 beats. No atrial fibrillation identified.  ?? TTE from 06/30/13: Normal EF 55-60%, mildly dilated RV, mild RV dysfunction.  ?? Nuclear stress test 07/2011:  ?? Normal myocardial perfusion study  ?? No evidence for significant ischemia or scar EF 62% and RV is mildly dilated.  ?? Minimal coronary calcification is noted involving the LAD and RCA    ______________________________________________________________________    Other past medical history, social history, family history, medications, allergies and problem list reviewed in the medical record.    Current cardiac medications include:   ?? Metoprolol 200 mg twice daily  ?? Aspirin 81 mg daily  ?? Chlorthalidone 25 mg daily  ?? Furosemide 40 mg once daily  ?? Irbesartan 150 mg daily  ?? Warfarin  ?? Restart today: Pravastatin      Objective:     BP 134/62 (BP Site: L Arm, BP Position: Sitting, BP Cuff Size: Large)  - Pulse 59  - Resp 12  - Ht 185.4 cm (6' 1)  - Wt (!) 124.7 kg (275 lb)  - SpO2 97%  - BMI 36.28 kg/m??     PHYSICAL EXAMINATION:   GENERAL: Alert, NAD  EYES: Sclerae clear, EOMI b/l  ENT: OP clear w/o exudate  NECK: Supple  CARDIOVASCULAR: RRR, normal S1/S2, no murmurs, rubs, or gallops.  RESPIRATORY: Clear to auscultation bilaterally. No wheezes, crackles, or rhonchi. Normal work of breathing.  ABDOMEN/GI: Soft, non-tender, non-distended with normoactive bowel sounds.  NEUROLOGIC: CN III-XII in tact, motor exam grossly non-focal.  SKIN: Small, scattered ecchymoses  PSYCH: Normal mental status, mood, and affect.  MSK: No effusions      ______________________________________________________________________  EKG today shows sinus bradycardia, left axis deviation, and poor R wave progression    Recent CV pertinent labs:  Lab Results   Component Value Date    LDL 77 07/02/2017    LDL 74 04/21/2015    NONHDL 125 07/02/2017    HDL 31 (L) 07/02/2017    HDL 36 (L) 04/21/2015    INR 2.18 07/02/2017    INR 2.9 04/15/2017    INR 1.3 07/08/2015    INR 2.80 12/10/2014    PROBNP 375 (H) 06/29/2014    CR 1.36 08/09/2014    CREATININE 1.7 (H) 07/09/2017    K 4.6 07/09/2017    BUN 49.00 (H) 07/09/2017    TSH 2.202 07/02/2017    TSH 1.20 07/02/2011

## 2017-07-16 NOTE — Unmapped (Signed)
Limbionic forms placed in your box along with last foot exam from 10/2016

## 2017-07-18 NOTE — Unmapped (Signed)
I also signed/dated that I agreed w Kashefksy's assessment that you had printed, but as you suggested, do not think that is recent enough, so would also print my 06/2017 visit

## 2017-07-19 ENCOUNTER — Ambulatory Visit: Admit: 2017-07-19 | Discharge: 2017-07-20 | Payer: MEDICARE

## 2017-07-19 DIAGNOSIS — Z940 Kidney transplant status: Principal | ICD-10-CM

## 2017-07-19 NOTE — Unmapped (Signed)
A urine specimen was collected at the visit.

## 2017-07-19 NOTE — Unmapped (Signed)
PCP:  [name], MD      ASSESSMENT/PLAN:  [name] is a 72 y.o. year old patient s/p LURD kidney transplant 2013 with postoperative complications of a massive PE in 2014, osteomyelitis requiring 4th/5th toe amputations in 2016 chronic nonhealing wound RLE 2016-2019.     1. CKD stage 3: Renal function at his baseline and his electrolytes are WNL. Urine dipstick negative for proteinuria and most recent UACR was within normal range. Continue ARB at low dose.     2. S/P LURD kidney transplant 2013.  Has had 3 prior biopsies all in 2013 revealing moderate to severe arteriosclerosis.  The patient has a history of a DQ 7 donor specific antibody noted on 09/17/2011 with all subsequent HLA antibody screens revealing no DSA's.  Continue current dose of mycophenolate and tacrolimus (goal trough of 4-7). Most recent was a little high at 8.5 so will continue to monitor and adjust as necessary.    3. HTN. BP within goal.    4. T2DM. Uncontrolled. Very insulin resistant and has had to be more sedentary over last year or two due to foot wound. Followed by endocrine. Goal HbA1c < 8%.    5. Hyperlipidemia. Controlled. Continue pravastatin.     6. Need for chronic anticoagulation. Completed treatment for PE but continues on coumadin for paroxysmal AFib. We'd discussed transition to eliquis but given lack of reversal agent, it was decided to continue the warfarin since he's been very stable on it.     7. HM: Had his flu shot and UTD on pneumonia vaccine. Due for colonoscopy but reportedly GI is leary of stopping anticoagulation so are following cologuard test results. No FH colon CA. Continue biannual follow up with dermatology re: recurrent basal cell CA.    [name] will return to see me in 3mos.    ______________________________________________________________________    HPI:  [name] is an 72 y.o. year old man s/p LURD kidney transplant (spouse) 08/21/2011 and B/L creatinine ~1.4-1.7 who returns for f/u. 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 He has significant PAD and neuropathy and has no sensation on either leg below his knees.     Glycemic control worsened quite a bit in December 2018 to HbA1c of 9.8% but this month had come down to 8.6%. Continues to work with Dr. Tiburcio Pea on this who has been adjusting his insulin regimen. Has a CGM. Also continues to be seen by wound care and thankfully his left lateral ulcer is nearly healed. Eye sight is doing okay and s/p vitreal hemorrhag requiring PPV/endolaser over the summer 2018.     Today, he  feels well and denies F/C, CP, SOB, cough, palpitations, N/V/D, hematuria/dysuria/frothy urine. Tacrolimus troughs have been 4-7 but most recent was 8.5. Creatinine stable at 1.7mg /dl and UA negative for hematuria or proteinuria.  He's finally been discharged from wound care as his foot has completely heeled. His diabetes control has worsened a bit with recent A1c levels of >9%, but most recent one was 8.7%. He's feeling generally well and is celebrating his 72th birthday today. He and his wife are planning a cruise in the fall to celebrate their 50th wedding anniversary. He denies F/C, CP, SOB, N/V/D, hematuria/dysuria  or changes in LE edema.    ROS: As per HPI. The remainder of the 10 system review is negative.    PAST MEDICAL HISTORY:  Past Medical History:   Diagnosis Date   ??? Atrial flutter (CMS-HCC)    ??? Diabetes mellitus (CMS-HCC)    ??? Diabetic nephropathy (CMS-HCC)    ??? Diabetic retinopathy (CMS-HCC)    ??? Hyperlipidemia    ??? Hypertension    ??? Osteomyelitis (CMS-HCC) June 2016   ??? Pulmonary embolism (CMS-HCC) Dec 2015   ??? 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 Stroke (CMS-HCC)    ??? Transplanted kidney 08/21/2011       ALLERGIES  Enalapril; Grass pollen-bermuda, standard; Levofloxacin; Lidocaine; Penicillins; Egg derived; Epinephrine; Lisinopril; and Mepivacaine hcl    MEDICATIONS:  Current Outpatient Prescriptions   Medication Sig Dispense Refill   ??? aspirin (ECOTRIN) 81 MG tablet Take 81 mg by mouth continuous as needed. Only when flying and/or traveling by car more than 2 hours.     ??? BD INSULIN SYRINGE ULTRA-FINE 0.3 mL 31 gauge x 15/64 Syrg 1 each by Other route Three (3) times a day. 300 Syringe 3   ??? BD ULTRA-FINE NANO PEN NEEDLE 32 gauge x 5/32 Ndle USE UP TO 3 TIMES DAILY 100 each 11   ??? blood sugar diagnostic (FREESTYLE PRECISION NEO STRIPS) Strp Use to test blood sugar 4 times daily. Dx E11.65. 150 each 5   ??? blood-glucose meter kit Use as instructed. 1 each 0   ??? chlorthalidone (HYGROTON) 25 MG tablet TAKE ONE TABLET EVERY DAY 60 tablet 0   ??? cholecalciferol, vitamin D3, (VITAMIN D3) 2,000 unit Tab Take 2,000 Units by mouth daily.      ??? furosemide (LASIX) 40 MG tablet Take 1-2 tablets once daily 180 tablet 3   ??? gabapentin (NEURONTIN) 300 MG capsule TAKE 1 CAPSULE BY MOUTH TWICE DAILY 90 capsule 0   ??? glucagon, human recombinant, (GLUCAGON) 1 mg injection Follow package directions for low blood sugar. 1 mg 1   ??? HUMULIN R U-500, CONC, KWIKPEN 500 unit/mL (3 mL) CONCENTRATED injection DIAL TO MAX 100 UNITS BEFORE [facility] AND DINNER.INJECT 30 MINS BEFORE MEALS.MAX OF 300 UNITS DAILY 18 mL 11   ??? hydrocortisone 2.5 % cream   0   ??? irbesartan (AVAPRO) 150 MG tablet Take 1 tablet (150 mg total) by mouth nightly. 90 tablet 3   ??? ketoconazole (NIZORAL) 2 % cream   0   ??? lancets Misc 1 each by Miscellaneous route Four (4) times a day. 150 each 6   ??? magnesium chloride (SLOW_MAG) 64 mg TbEC Take 128 mg by mouth three (3) times a day (at 6am, noon and 6pm).      ??? metoprolol succinate (TOPROL-XL) 200 MG 24 hr tablet TAKE ONE TABLET BY MOUTH TWICE DAILY 180 tablet 3   ??? multivitamin capsule Take 1 capsule by mouth daily.     ??? mycophenolate (MYFORTIC) 180 MG EC tablet Take 3 tablets (540 mg total) by mouth Two (2) times a day. 540 tablet 3   ??? pravastatin (PRAVACHOL) 20 MG tablet Take 1 tablet (20 mg total) by mouth daily. 90 tablet 3   ??? silver sulfaDIAZINE (SILVADENE) 1 % cream Apply topically daily. 50 g 0   ??? tacrolimus (PROGRAF) 1 MG capsule Take 3 capsules (3 mg total) by mouth Two (2) times a day. Z94.0 540 capsule 3   ???  warfarin (COUMADIN) 2.5 MG tablet Take 1 tablet (2.5 mg total) by mouth Every Monday. 30 tablet 2   ??? warfarin (COUMADIN) 5 MG tablet Take 5 mg every day except mondays 30 tablet 6     No current facility-administered medications for this visit.      Facility-Administered Medications Ordered in Other Visits   Medication Dose Route Frequency Provider Last Rate Last Dose   ??? bevacizumab    PRN (once a day) Melvyn Novas, MD   2.5 mg at 01/28/17 0750         PHYSICAL EXAM:  146/70 HR 64 Temp 37C  CONSTITUTIONAL: Alert,well appearing, no distress  CARDIOVASCULAR: Regular, normal S1/S2 heart sounds  PULM: Clear to auscultation bilaterally  EXTREMITIES:  1+ B/L LE edema (L>R chronic)    MEDICAL DECISION MAKING    Results for orders placed or performed in visit on 07/09/17   Basic Metabolic Panel   Result Value Ref Range    Potassium 4.6 3.5 - 5.1 mmol/L    Sodium 139 136 - 145 mmol/L    Chloride 104 95 - 110 mmol/L    CO2 24.2 21.0 - 31.0 mmol/L    Glucose 248.00 (H) 60.00 - 100.00 mg/dL    BUN 42.59 (H) 5.63 - 26.00 mg/dL    Creatinine 1.7 (H) 0.5 - 1.5 mg/dL    BUN/Creatinine Ratio 28.8 (H) 12.0 - 25.0 ratio    Calcium 8.9 8.5 - 10.5 mg/dL    eGFR 43 (L) >87 FI/EPP/2.95 m2    eGFR If Africn Am 52 (L) >59 mL/min/1.73 m2     *Note: Due to a large number of results and/or encounters for the requested time period, some results have not been displayed. A complete set of results can be found in Results Review.        Lab Results   Component Value Date    NA 139 07/09/2017    K 4.6 07/09/2017    CL 104 07/09/2017    CO2 24.2 07/09/2017    BUN 49.00 (H) 07/09/2017    CREATININE 1.7 (H) 07/09/2017    GFRAA 48 (L) 07/02/2017    GFRNONAA 40 (L) 07/02/2017    ALBUMIN 4.1 07/02/2017     Lab Results   Component Value Date    WBC 7.3 07/02/2017    HGB 15.7 07/02/2017    HCT 48.1 07/02/2017    PLT 234 07/02/2017     Lab Results   Component Value Date    PTH 129.2 (H) 07/02/2017    CALCIUM 8.9 07/09/2017    PHOS 3.8 07/02/2017

## 2017-07-22 NOTE — Unmapped (Signed)
Limbionics form placed in box Dr. Tiburcio Pea for completion along with last Endocrinology clinic note.  Will forward to Dr. Tiburcio Pea for follow-up.

## 2017-07-22 NOTE — Unmapped (Signed)
[facility] Refill Coordination Note  Specialty Medication(s): Prograf 1mg  & Myfortic 180mg     David Ellis, DOB: [birth year]  Phone: [phone] (home) , Alternate phone contact: N/A  Phone or address changes today?: No  All above HIPAA information was verified with patient.  Shipping Address: [address]  [place] [address]   Insurance changes? No    Completed refill call assessment today to schedule patient's medication shipment from the [facility] [phone]).      Confirmed the medication and dosage are correct and have not changed: Yes, regimen is correct and unchanged.    Confirmed patient started or stopped the following medications in the past month:  No, there are no changes reported at this time.    Are you tolerating your medication?:  David Ellis reports tolerating the medication.    ADHERENCE    (Below is required for Medicare Part B or Transplant patients only - per drug):   How many tablets were dispensed last month:     Myfortic 180 mg   Quantity filled last month: 180   # of tablets left on hand: 6 DAYS    Prograf 1 mg   Quantity filled last month: 180   # of tablets left on hand: 6 DAYS    Did you miss any doses in the past 4 weeks? No missed doses reported.    FINANCIAL/SHIPPING    Delivery Scheduled: Yes, Expected medication delivery date: 07/26/2017     David Ellis did not have any additional questions at this time.    Delivery address validated in [facility] scheduling system: Yes, address listed in [facility] is correct.    We will follow up with patient monthly for standard refill processing and delivery.      Thank you,  Tamala Fothergill   [facility] Specialty Technician

## 2017-07-25 MED FILL — MYFORTIC/180MG/TAB: MYFORTIC/180MG/TAB | 30 days supply | Qty: 180 | Fill #7

## 2017-07-25 MED FILL — PROGRAF/1MG/CAP: PROGRAF/1MG/CAP | 30 days supply | Qty: 180 | Fill #7

## 2017-07-26 NOTE — Unmapped (Signed)
Signed.

## 2017-07-29 ENCOUNTER — Ambulatory Visit: Admit: 2017-07-29 | Discharge: 2017-07-29 | Payer: MEDICARE

## 2017-07-29 DIAGNOSIS — E1129 Type 2 diabetes mellitus with other diabetic kidney complication: Secondary | ICD-10-CM

## 2017-07-29 DIAGNOSIS — N189 Chronic kidney disease, unspecified: Secondary | ICD-10-CM

## 2017-07-29 DIAGNOSIS — E1165 Type 2 diabetes mellitus with hyperglycemia: Secondary | ICD-10-CM

## 2017-07-29 DIAGNOSIS — D631 Anemia in chronic kidney disease: Secondary | ICD-10-CM

## 2017-07-29 DIAGNOSIS — Z940 Kidney transplant status: Secondary | ICD-10-CM

## 2017-07-29 DIAGNOSIS — I2699 Other pulmonary embolism without acute cor pulmonale: Secondary | ICD-10-CM

## 2017-07-29 DIAGNOSIS — I739 Peripheral vascular disease, unspecified: Principal | ICD-10-CM

## 2017-07-29 DIAGNOSIS — Z1159 Encounter for screening for other viral diseases: Secondary | ICD-10-CM

## 2017-07-29 DIAGNOSIS — Z79899 Other long term (current) drug therapy: Secondary | ICD-10-CM

## 2017-07-29 LAB — COMPREHENSIVE METABOLIC PANEL
ALBUMIN: 4.1 g/dL (ref 3.5–5.0)
ALKALINE PHOSPHATASE: 74 U/L (ref 38–126)
ALT (SGPT): 35 U/L (ref 19–72)
AST (SGOT): 26 U/L (ref 19–55)
BILIRUBIN TOTAL: 0.8 mg/dL (ref 0.0–1.2)
BLOOD UREA NITROGEN: 54 mg/dL — ABNORMAL HIGH (ref 7–21)
BUN / CREAT RATIO: 32
CALCIUM: 9.2 mg/dL (ref 8.5–10.2)
CHLORIDE: 96 mmol/L — ABNORMAL LOW (ref 98–107)
CO2: 28 mmol/L (ref 22.0–30.0)
CREATININE: 1.67 mg/dL — ABNORMAL HIGH (ref 0.70–1.30)
EGFR MDRD AF AMER: 50 mL/min/{1.73_m2} — ABNORMAL LOW (ref >=60)
EGFR MDRD NON AF AMER: 41 mL/min/{1.73_m2} — ABNORMAL LOW (ref >=60)
GLUCOSE RANDOM: 290 mg/dL — ABNORMAL HIGH (ref 65–179)
POTASSIUM: 4.2 mmol/L (ref 3.5–5.0)
PROTEIN TOTAL: 6.4 g/dL — ABNORMAL LOW (ref 6.5–8.3)
SODIUM: 139 mmol/L (ref 135–145)

## 2017-07-29 LAB — URINALYSIS WITH CULTURE REFLEX
BACTERIA: NONE SEEN /HPF
BILIRUBIN UA: NEGATIVE
BLOOD UA: NEGATIVE
GLUCOSE UA: NEGATIVE
KETONES UA: NEGATIVE
LEUKOCYTE ESTERASE UA: NEGATIVE
NITRITE UA: NEGATIVE
PH UA: 5 (ref 5.0–9.0)
PROTEIN UA: NEGATIVE
RBC UA: <1 /HPF (ref <=3)
SPECIFIC GRAVITY UA: 1.011 (ref 1.003–1.030)
SQUAMOUS EPITHELIAL: <1 /HPF (ref 0–5)
UROBILINOGEN UA: 0.2

## 2017-07-29 LAB — CBC W/ AUTO DIFF
BASOPHILS ABSOLUTE COUNT: 0 10*9/L (ref 0.0–0.1)
BASOPHILS RELATIVE PERCENT: 0.2 %
EOSINOPHILS ABSOLUTE COUNT: 0.1 10*9/L (ref 0.0–0.4)
EOSINOPHILS RELATIVE PERCENT: 1.4 %
HEMATOCRIT: 47.3 % (ref 41.0–53.0)
LARGE UNSTAINED CELLS: 2 % (ref 0–4)
LYMPHOCYTES ABSOLUTE COUNT: 1.1 10*9/L — ABNORMAL LOW (ref 1.5–5.0)
LYMPHOCYTES RELATIVE PERCENT: 15.7 %
MEAN CORPUSCULAR HEMOGLOBIN: 28.1 pg (ref 26.0–34.0)
MEAN CORPUSCULAR VOLUME: 86.2 fL (ref 80.0–100.0)
MEAN PLATELET VOLUME: 9 fL (ref 7.0–10.0)
MONOCYTES ABSOLUTE COUNT: 0.4 10*9/L (ref 0.2–0.8)
MONOCYTES RELATIVE PERCENT: 5.8 %
NEUTROPHILS ABSOLUTE COUNT: 5.1 10*9/L (ref 2.0–7.5)
NEUTROPHILS RELATIVE PERCENT: 75.5 %
PLATELET COUNT: 202 10*9/L (ref 150–440)
RED BLOOD CELL COUNT: 5.5 10*12/L (ref 4.50–5.90)
RED CELL DISTRIBUTION WIDTH: 13.4 % (ref 12.0–15.0)

## 2017-07-29 LAB — INR: Lab: 1.89

## 2017-07-29 LAB — EOSINOPHILS ABSOLUTE COUNT: Lab: 0.1

## 2017-07-29 LAB — LIPID PANEL
CHOLESTEROL/HDL RATIO SCREEN: 4.7 (ref <5.0)
CHOLESTEROL: 131 mg/dL (ref 100–199)
NON-HDL CHOLESTEROL: 103 mg/dL
VLDL CHOLESTEROL CAL: 52.6 mg/dL — ABNORMAL HIGH (ref 12–42)

## 2017-07-29 LAB — PARATHYROID HORMONE INTACT
Parathyrin.intact:MCnc:Pt:Ser/Plas:Qn:: 155.5 — ABNORMAL HIGH
Parathyrin.intact:MCnc:Pt:Ser/Plas:Qn:: 157.8 — ABNORMAL HIGH

## 2017-07-29 LAB — IRON & TIBC
IRON SATURATION (CALC): 31 % (ref 20–50)
IRON: 105 ug/dL (ref 35–165)
TRANSFERRIN: 269.6 mg/dL (ref 200.0–380.0)

## 2017-07-29 LAB — CMV DNA, QUANTITATIVE, PCR: CMV VIRAL LD: NOT DETECTED

## 2017-07-29 LAB — CHLORIDE: Chloride:SCnc:Pt:Ser/Plas:Qn:: 96 — ABNORMAL LOW

## 2017-07-29 LAB — CHOLESTEROL/HDL RATIO SCREEN: Lab: 4.7

## 2017-07-29 LAB — PARATHYROID HOMONE (PTH): CALCIUM: 9.2 mg/dL (ref 8.5–10.2)

## 2017-07-29 LAB — PHOSPHORUS: Phosphate:MCnc:Pt:Ser/Plas:Qn:: 3.9

## 2017-07-29 LAB — TACROLIMUS BLOOD: Lab: 9

## 2017-07-29 LAB — SPECIFIC GRAVITY UA: Lab: 1.011

## 2017-07-29 LAB — BILIRUBIN DIRECT: Bilirubin.glucuronidated:MCnc:Pt:Ser/Plas:Qn:: 0.3

## 2017-07-29 LAB — TOTAL IRON BINDING CAPACITY (CALC): Lab: 339.7

## 2017-07-29 LAB — CMV VIRAL LD: Lab: NOT DETECTED

## 2017-07-29 NOTE — Unmapped (Signed)
Diabetes Foot Health: Care Instructions  Your Care Instructions    When you have diabetes, your feet need extra care and attention. Diabetes can damage the nerve endings and blood vessels in your feet, making you less likely to notice when your feet are injured. Diabetes also limits your body's ability to fight infection and get blood to areas that need it. If you get a minor foot injury, it could become an ulcer or a serious infection. With good foot care, you can prevent most of these problems.  Caring for your feet can be quick and easy. Most of the care can be done when you are bathing or getting ready for bed.  Follow-up care is a key part of your treatment and safety. Be sure to make and go to all appointments, and call your doctor if you are having problems. It's also a good idea to know your test results and keep a list of the medicines you take.  How can you care for yourself at home?  ?? Keep your blood sugar close to normal by watching what and how much you eat, monitoring blood sugar, taking medicines if prescribed, and getting regular exercise.  ?? Do not smoke. Smoking affects blood flow and can make foot problems worse. If you need help quitting, talk to your doctor about stop-smoking programs and medicines. These can increase your chances of quitting for good.  ?? Eat a diet that is low in fats. High fat intake can cause fat to build up in your blood vessels and decrease blood flow.  ?? Inspect your feet daily for blisters, cuts, cracks, or sores. If you cannot see well, use a mirror or have someone help you.  ?? Take care of your feet:  ? Wash your feet every day. Use warm (not hot) water. Check the water temperature with your wrists or other part of your body, not your feet.  ? Dry your feet well. Pat them dry. Do not rub the skin on your feet too hard. Dry well between your toes. If the skin on your feet stays moist, bacteria or a fungus can grow, which can lead to infection.  ? Keep your skin soft. Use moisturizing skin cream to keep the skin on your feet soft and prevent calluses and cracks. But do not put the cream between your toes, and stop using any cream that causes a rash.  ? Clean underneath your toenails carefully. Do not use a sharp object to clean underneath your toenails. Use the blunt end of a nail file or other rounded tool.  ? Trim and file your toenails straight across to prevent ingrown toenails. Use a nail clipper, not scissors. Use an emery board to smooth the edges.  ?? Change socks daily. Socks without seams are best, because seams often rub the feet. You can find socks for people with diabetes from specialty catalogs.  ?? Look inside your shoes every day for things like gravel or torn linings, which could cause blisters or sores.  ?? Buy shoes that fit well:  ? Look for shoes that have plenty of space around the toes. This helps prevent bunions and blisters.  ? Try on shoes while wearing the kind of socks you will usually wear with the shoes.  ? Avoid plastic shoes. They may rub your feet and cause blisters. Good shoes should be made of materials that are flexible and breathable, such as leather or cloth.  ? Break in new shoes slowly by wearing them  for no more than an hour a day for several days. Take extra time to check your feet for red areas, blisters, or other problems after you wear new shoes.  ?? Do not go barefoot. Do not wear sandals, and do not wear shoes with very thin soles. Thin soles are easy to puncture. They also do not protect your feet from hot pavement or cold weather.  ?? Have your doctor check your feet during each visit. If you have a foot problem, see your doctor. Do not try to treat an early foot problem at home. Home remedies or treatments that you can buy without a prescription (such as corn removers) can be harmful.  ?? Always get early treatment for foot problems. A minor irritation can lead to a major problem if not properly cared for early.  When should you call for help?  Call your doctor now or seek immediate medical care if:  ?? ?? You have a foot sore, an ulcer or break in the skin that is not healing after 4 days, bleeding corns or calluses, or an ingrown toenail.   ?? ?? You have blue or black areas, which can mean bruising or blood flow problems.   ?? ?? You have peeling skin or tiny blisters between your toes or cracking or oozing of the skin.   ?? ?? You have a fever for more than 24 hours and a foot sore.   ?? ?? You have new numbness or tingling in your feet that does not go away after you move your feet or change positions.   ?? ?? You have unexplained or unusual swelling of the foot or ankle.   ??Watch closely for changes in your health, and be sure to contact your doctor if:  ?? ?? You cannot do proper foot care.   Where can you learn more?  Go to [facility] at [email or web address]  Select Preferences in the upper right hand corner, then select Health Library under Resources. Enter A739 in the search box to learn more about Diabetes Foot Health: Care Instructions.  Current as of: December 26, 2016  Content Version: 11.9  ?? 2006-2018 [employer]. Care instructions adapted under license by [facility] If you have questions about a medical condition or this instruction, always ask your healthcare professional. [employer] disclaims any warranty or liability for your use of this information.

## 2017-07-29 NOTE — Unmapped (Signed)
Patient Active Problem List   Diagnosis   ??? Type II diabetes mellitus with renal manifestations, uncontrolled (CMS-HCC)   ??? Hypertension   ??? History of kidney transplant   ??? Mixed hyperlipidemia   ??? 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 Transplanted kidney   ??? Pulmonary embolism (CMS-HCC)   ??? Edema   ??? Right ventricular dysfunction   ??? Atrial fibrillation (CMS-HCC)   ??? Aftercare following organ transplant   ??? Diverticulosis of colon   ??? Routine general medical examination at a health care facility   ??? Diabetic polyneuropathy associated with type 2 diabetes mellitus (CMS-HCC)   ??? Diabetic ulcer of left foot associated with type 2 diabetes mellitus (CMS-HCC)   ??? Toe amputation status (CMS-HCC)   ??? On statin therapy due to risk of future cardiovascular event   ??? Left knee pain   ??? BCC (basal cell carcinoma), face   ??? Critical lower limb ischemia   ??? Acquired absence of other toe(s), unspecified side (CMS-HCC)   ??? History of colonic polyps   ??? Non-pressure chronic ulcer of other part of unspecified foot with unspecified severity (CMS-HCC)   ??? 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 Hypoglycemia associated with type 2 diabetes mellitus (CMS-HCC)   ??? PAD (peripheral artery disease) (CMS-HCC)     David Ellis is a 72 year old gentleman with the above medical problems who returns for routine follow-up today.  He has a history of fourth and fifth toe amputations as well as partial metatarsal resection and had a nonhealing ulcer for many months.  He now has diabetic shoes with custom inserts.  There is no further evidence of ulceration.  He is back for routine evaluation prior to him going on a 10-day trip.  No recent fevers or chills or night sweats.  Other 10 system review negative except for that mentioned in above history of present illness.    Physical exam    General: 72 year old gentleman in no apparent distress.    Blood pressure 136/54, pulse 61, temperature 36.7 ??C (98 ??F), temperature source Temporal, height 185.4 cm (6' 1), weight (!) 123.8 kg (273 lb).    Wound 07/19/15 left lateral foot scattered (Active)   Dressing Status      No dressing 07/08/2017  2:50 PM   Wound Length (cm)      0 07/08/2017  2:50 PM   Wound Width (cm)      0 07/08/2017  2:50 PM   Wound Depth (cm)      0 07/08/2017  2:50 PM   Area (sq cm) (Calculated) 0 07/08/2017  2:50 PM   Volume (mL) (Calculated) 0 07/08/2017  2:50 PM       Wound 08/25/15 Foot Left Bottom Lateral Fissure (Active)       Wound 09/14/16 left lateral foot (Active)     Extremities: Brisk audible Doppler signals by handheld Doppler for DP and PT pulses for the left foot.  There is very little evidence of callus remaining at the fourth and fifth toe amputation site or previous ulcer had been noted.  There is no need.  Down in the callus as it is very soft and responding well to thick emollients.  No other abnormalities of the left foot.  No interdigital ulcers or signs of infection.    Impression: Healed left diabetic foot ulcer status post previous fourth and fifth toe amputation with partial metatarsal resection    Treatment plan  1.  Continue to wear diabetic shoes with custom inserts.    2.  I see no problem with him going on his trip at this point.  No open ulcers.  He is to evaluate his foot several times per day while on vacation.  He is to avoid walking barefoot at any time and if he walks on the beach he is to have some sort of beach shoe which will prevent rubbing of the sand on his skin.  All precautions reviewed prior to him going on a trip.    3.  We will see him back in a month and if at that point he has done well and has no further concerns then we can extend his visits out to every 3 months or so.  All return precautions reviewed in detail.  He is to seek emergent care should he note any new ulcer.

## 2017-07-30 MED ORDER — GABAPENTIN 300 MG CAPSULE
ORAL_CAPSULE | 0 refills | 0 days | Status: CP
Start: 2017-07-30 — End: 2017-08-24

## 2017-07-30 NOTE — Unmapped (Signed)
-----   Message from Sharlee Blew, MD sent at 07/30/2017  9:59 AM EST -----  Call and let patient know labs are stable except INR is sl low.  What is current coumadin dosing?      Forwarded to nephrology and endocrinology?    [name], M.D.     [facility] Internal Medicine at [facility]   [address]  [address]  [place], [place]  [address]  [phone]

## 2017-07-30 NOTE — Unmapped (Signed)
Patient notified of coumadin dosage change and voiced understanding.   Anticoagulation tracker updated.  Patient stated that he will return in 2 weeks for recheck.

## 2017-07-30 NOTE — Unmapped (Signed)
Patient states that he has not missed any doses and is currently taking 2.5 mg coumadin on Mondays Thursdays, and 5 mg all other days.  Please advise on dosing thanks.

## 2017-07-30 NOTE — Unmapped (Signed)
Increase to 5 mg daily except mOn, just take 2.5.    Repeat INR in one to two weeks.    [name], M.D.     [facility] Internal Medicine at [facility]   [address]  [address]  [place], [place]  [address]  [phone]

## 2017-07-30 NOTE — Unmapped (Signed)
Pt last seen 07/23/17 by Dr.Mottl. Gabapentin 300mg  BID #180 with no refills escribed to get pt to his F/U in three months.

## 2017-07-31 LAB — HLA DS POST TRANSPLANT
ANTI-DONOR DRW #2 MFI: 0 MFI
ANTI-DONOR HLA-B #1 MFI: 58 MFI
ANTI-DONOR HLA-B #2 MFI: 0 MFI
ANTI-DONOR HLA-C #1 MFI: 0 MFI
ANTI-DONOR HLA-C #2 MFI: 0 MFI
ANTI-DONOR HLA-DQB #1 MFI: 19 MFI
ANTI-DONOR HLA-DQB #2 MFI: 99 MFI
ANTI-DONOR HLA-DR #1 MFI: 0 MFI
ANTI-DONOR HLA-DR #2 MFI: 0 MFI
DONOR DRW ANTIGEN #2: 53
DONOR HLA-A ANTIGEN #1: 31
DONOR HLA-B ANTIGEN #2: 62
DONOR HLA-C ANTIGEN #1: 4
DONOR HLA-C ANTIGEN #2: 12
DONOR HLA-DQB ANTIGEN #1: 5
DONOR HLA-DQB ANTIGEN #2: 7
DONOR HLA-DR ANTIGEN #1: 1
DONOR HLA-DR ANTIGEN #2: 4

## 2017-07-31 LAB — FSAB CLASS 1 ANTIBODY SPECIFICITY: HLA CLASS 1 ANTIBODY RESULT: NEGATIVE

## 2017-07-31 LAB — FSAB CLASS 2 ANTIBODY SPECIFICITY: HLA CL2 AB RESULT: NEGATIVE

## 2017-07-31 LAB — ESTIMATED AVERAGE GLUCOSE: Estimated average glucose:MCnc:Pt:Bld:Qn:Estimated from glycated hemoglobin: 212

## 2017-07-31 LAB — CPRA%: Lab: 0

## 2017-07-31 LAB — ANTI-DONOR HLA-DQB #1 MFI: Lab: 19

## 2017-07-31 LAB — HLA CL1 ANTIBODY COMM: Lab: 0

## 2017-08-01 LAB — VITAMIN D 1,25-DIHYDROXY: 1,25-Dihydroxyvitamin D:MCnc:Pt:Ser/Plas:Qn:: 27

## 2017-08-01 NOTE — Unmapped (Signed)
Patient notified and voiced understanding.

## 2017-08-01 NOTE — Unmapped (Signed)
Baby aspirin daily is minimal additional risk to coumadin.  Ok with me.    Do not change the coumadin dosing based on additional aspirin.  THey work on different pathways.    [name], M.D.     [facility] Internal Medicine at [facility]   [address]  [address]  [place], [place]  [address]  [phone]

## 2017-08-01 NOTE — Unmapped (Signed)
Patient is traveling to [facility] for 10 days and stated that his cardiologist advised him to take aspirin 81 mg daily while traveling.  Patient is concerned of the effect on his coumadin level and would like to know if he should change dosing or reschedule the coumadin clinic visit scheduled for two weeks out.  Please advise.

## 2017-08-12 NOTE — Unmapped (Signed)
[facility] Refill and Clinical Coordination Note  Medication(s): MYFORTIC, PROGRAF    [name], DOB: [birth year]  Phone: [phone] (home) , Alternate phone contact: N/A  Shipping address: [name]  [place] [address]  Phone or address changes today?: No  All above HIPAA information verified.  Insurance changes? No    Completed refill and clinical call assessment today to schedule patient's medication shipment from the [facility] [phone]).      MEDICATION RECONCILIATION    Confirmed the medication and dosage are correct and have not changed: Yes, regimen is correct and unchanged.    Were there any changes to your medication(s) in the past month:  WARFARIN IS BEING CONTINUALLY ADJUSTED    ADHERENCE    Is this medicine transplant or covered by Medicare Part B? Yes.    Myfortic 180 mg   Quantity filled last month: 180   # of tablets left on hand: 13 DAYS LEFT  Prograf 1 mg   Quantity filled last month: 180   # of tablets left on hand: 13 DAYS LEFT      Did you miss any doses in the past 4 weeks? No missed doses reported.  Adherence counseling provided? Not needed     SIDE EFFECT MANAGEMENT    Are you tolerating your medication?:  [place]field reports tolerating the medication.  Side effect management discussed: None      Therapy is appropriate and should be continued.    Evidence of clinical benefit: See Epic note from 07/19/17      FINANCIAL/SHIPPING    Delivery Scheduled: Yes, Expected medication delivery date: 08/20/17   Additional medications refilled: No additional medications/refills needed at this time.    The patient will receive an FSI print out for each medication shipped and additional FDA Medication Guides as required.  Patient education from [employer] or [facility]u may also be included in the shipment.    [facility] did not have any additional questions at this time.    Delivery address validated in FSI scheduling system: Yes, address listed above is correct. We will follow up with patient monthly for standard refill processing and delivery.      Thank you,  [name]   [facility] Specialty Pharmacist

## 2017-08-14 ENCOUNTER — Ambulatory Visit: Admit: 2017-08-14 | Discharge: 2017-08-15 | Payer: MEDICARE

## 2017-08-14 DIAGNOSIS — I4891 Unspecified atrial fibrillation: Principal | ICD-10-CM

## 2017-08-14 NOTE — Unmapped (Signed)
Patient came in today for POC INR visit which read 2.8. Patient is currently taking 5 mg daily for a weekly total of 35 mg. Denies any missed doses, diet changes, bleeding, or new medications. The anticoag. tracker states that patient should be taking 32.5 mg weekly.    Last INR was 1.89 on 2/25. At that time we advised the patient to increase dose.    Patient advised to continue current dose (35 mg per week) and recheck INR in 2 weeks per standing order.     Dr. Dellis Filbert states that patient will be back for venipuncture INR check in 1 week.

## 2017-08-19 MED FILL — PROGRAF/1MG/CAP: PROGRAF/1MG/CAP | 30 days supply | Qty: 180 | Fill #8

## 2017-08-19 MED FILL — MYFORTIC/180MG/TAB: MYFORTIC/180MG/TAB | 30 days supply | Qty: 180 | Fill #8

## 2017-08-27 ENCOUNTER — Ambulatory Visit: Admit: 2017-08-27 | Discharge: 2017-08-27 | Payer: MEDICARE

## 2017-08-27 ENCOUNTER — Ambulatory Visit: Admit: 2017-08-27 | Discharge: 2017-08-27 | Payer: MEDICARE | Attending: Nephrology | Primary: Nephrology

## 2017-08-27 ENCOUNTER — Ambulatory Visit: Admit: 2017-08-27 | Discharge: 2017-08-27 | Payer: MEDICARE | Attending: Vascular Surgery | Primary: Vascular Surgery

## 2017-08-27 DIAGNOSIS — M25551 Pain in right hip: Principal | ICD-10-CM

## 2017-08-27 DIAGNOSIS — Z48298 Encounter for aftercare following other organ transplant: Secondary | ICD-10-CM

## 2017-08-27 DIAGNOSIS — Z79899 Other long term (current) drug therapy: Secondary | ICD-10-CM

## 2017-08-27 DIAGNOSIS — I2699 Other pulmonary embolism without acute cor pulmonale: Secondary | ICD-10-CM

## 2017-08-27 DIAGNOSIS — Z940 Kidney transplant status: Principal | ICD-10-CM

## 2017-08-27 DIAGNOSIS — Z1159 Encounter for screening for other viral diseases: Secondary | ICD-10-CM

## 2017-08-27 DIAGNOSIS — I739 Peripheral vascular disease, unspecified: Principal | ICD-10-CM

## 2017-08-27 LAB — PROTEIN URINE: Protein:MCnc:Pt:Urine:Qn:: <4

## 2017-08-27 LAB — CBC W/ AUTO DIFF
BASOPHILS ABSOLUTE COUNT: 0 10*9/L (ref 0.0–0.1)
BASOPHILS RELATIVE PERCENT: 0.5 %
EOSINOPHILS RELATIVE PERCENT: 1.2 %
HEMOGLOBIN: 15.1 g/dL (ref 13.5–17.5)
LARGE UNSTAINED CELLS: 2 % (ref 0–4)
LYMPHOCYTES ABSOLUTE COUNT: 0.9 10*9/L — ABNORMAL LOW (ref 1.5–5.0)
LYMPHOCYTES RELATIVE PERCENT: 14.2 %
MEAN CORPUSCULAR HEMOGLOBIN CONC: 33.2 g/dL (ref 31.0–37.0)
MEAN CORPUSCULAR HEMOGLOBIN: 28.1 pg (ref 26.0–34.0)
MEAN CORPUSCULAR VOLUME: 84.7 fL (ref 80.0–100.0)
MEAN PLATELET VOLUME: 8 fL (ref 7.0–10.0)
MONOCYTES ABSOLUTE COUNT: 0.3 10*9/L (ref 0.2–0.8)
NEUTROPHILS ABSOLUTE COUNT: 5 10*9/L (ref 2.0–7.5)
NEUTROPHILS RELATIVE PERCENT: 77.3 %
PLATELET COUNT: 213 10*9/L (ref 150–440)
RED BLOOD CELL COUNT: 5.36 10*12/L (ref 4.50–5.90)
RED CELL DISTRIBUTION WIDTH: 13.8 % (ref 12.0–15.0)
WBC ADJUSTED: 6.4 10*9/L (ref 4.5–11.0)

## 2017-08-27 LAB — COMPREHENSIVE METABOLIC PANEL
ALBUMIN: 3.6 g/dL (ref 3.5–5.0)
ALKALINE PHOSPHATASE: 77 U/L (ref 38–126)
ALT (SGPT): 32 U/L (ref 19–72)
ANION GAP: 12 mmol/L (ref 9–15)
AST (SGOT): 26 U/L (ref 19–55)
BILIRUBIN TOTAL: 0.6 mg/dL (ref 0.0–1.2)
BLOOD UREA NITROGEN: 49 mg/dL — ABNORMAL HIGH (ref 7–21)
BUN / CREAT RATIO: 30
CALCIUM: 8.7 mg/dL (ref 8.5–10.2)
CO2: 25 mmol/L (ref 22.0–30.0)
CREATININE: 1.66 mg/dL — ABNORMAL HIGH (ref 0.70–1.30)
EGFR MDRD AF AMER: 50 mL/min/{1.73_m2} — ABNORMAL LOW (ref >=60)
EGFR MDRD NON AF AMER: 41 mL/min/{1.73_m2} — ABNORMAL LOW (ref >=60)
GLUCOSE RANDOM: 272 mg/dL — ABNORMAL HIGH (ref 65–179)
POTASSIUM: 4.3 mmol/L (ref 3.5–5.0)
PROTEIN TOTAL: 6.1 g/dL — ABNORMAL LOW (ref 6.5–8.3)
SODIUM: 136 mmol/L (ref 135–145)

## 2017-08-27 LAB — PHOSPHORUS: Phosphate:MCnc:Pt:Ser/Plas:Qn:: 3.6

## 2017-08-27 LAB — URINALYSIS
BACTERIA: NONE SEEN /HPF
BILIRUBIN UA: NEGATIVE
BLOOD UA: NEGATIVE
GLUCOSE UA: NEGATIVE
HYALINE CASTS: 2 /LPF — ABNORMAL HIGH (ref 0–1)
LEUKOCYTE ESTERASE UA: NEGATIVE
NITRITE UA: NEGATIVE
PH UA: 5 (ref 5.0–9.0)
PROTEIN UA: NEGATIVE
RBC UA: <1 /HPF (ref <=3)
SPECIFIC GRAVITY UA: 1.015 (ref 1.003–1.030)
SQUAMOUS EPITHELIAL: <1 /HPF (ref 0–5)
WBC UA: 1 /HPF (ref <=2)

## 2017-08-27 LAB — TACROLIMUS, TROUGH: Lab: 7.4

## 2017-08-27 LAB — MAGNESIUM: Magnesium:MCnc:Pt:Ser/Plas:Qn:: 1.9

## 2017-08-27 LAB — PARATHYROID HORMONE INTACT: Parathyrin.intact:MCnc:Pt:Ser/Plas:Qn:: 133.6 — ABNORMAL HIGH

## 2017-08-27 LAB — HEMATOCRIT: Lab: 45.4

## 2017-08-27 LAB — RBC UA: Lab: <1

## 2017-08-27 LAB — PTH: CALCIUM: 8.9 mg/dL (ref 8.5–10.2)

## 2017-08-27 LAB — ALBUMIN: Albumin:MCnc:Pt:Ser/Plas:Qn:: 3.6

## 2017-08-27 LAB — PROTIME: Lab: 34.1 — ABNORMAL HIGH

## 2017-08-27 LAB — PROTEIN / CREATININE RATIO, URINE

## 2017-08-27 MED ORDER — GABAPENTIN 300 MG CAPSULE
ORAL_CAPSULE | 5 refills | 0 days | Status: CP
Start: 2017-08-27 — End: 2018-09-10

## 2017-08-27 NOTE — Unmapped (Signed)
This is a transplant pt. Can you please refill this medication?    Thanks,  Lenis Dickinson

## 2017-08-27 NOTE — Unmapped (Signed)
Diabetes Foot Health: Care Instructions  Your Care Instructions     When you have diabetes, your feet need extra care and attention. Diabetes can damage the nerve endings and blood vessels in your feet, making you less likely to notice when your feet are injured. Diabetes also limits your body's ability to fight infection and get blood to areas that need it. If you get a minor foot injury, it could become an ulcer or a serious infection. With good foot care, you can prevent most of these problems.  Caring for your feet can be quick and easy. Most of the care can be done when you are bathing or getting ready for bed.  Follow-up care is a key part of your treatment and safety. Be sure to make and go to all appointments, and call your doctor if you are having problems. It???s also a good idea to know your test results and keep a list of the medicines you take.  How can you care for yourself at home?  ?? Keep your blood sugar close to normal by watching what and how much you eat, monitoring blood sugar, taking medicines if prescribed, and getting regular exercise.  ?? Do not smoke. Smoking affects blood flow and can make foot problems worse. If you need help quitting, talk to your doctor about stop-smoking programs and medicines. These can increase your chances of quitting for good.  ?? Eat a diet that is low in fats. High fat intake can cause fat to build up in your blood vessels and decrease blood flow.  ?? Inspect your feet daily for blisters, cuts, cracks, or sores. If you cannot see well, use a mirror or have someone help you.  ?? Take care of your feet:  ?? Wash your feet every day. Use warm (not hot) water. Check the water temperature with your wrists or other part of your body, not your feet.  ?? Dry your feet well. Pat them dry. Do not rub the skin on your feet too hard. Dry well between your toes. If the skin on your feet stays moist, bacteria or a fungus can grow, which can lead to infection.  ?? Keep your skin soft. Use moisturizing skin cream to keep the skin on your feet soft and prevent calluses and cracks. But do not put the cream between your toes, and stop using any cream that causes a rash.  ?? Clean underneath your toenails carefully. Do not use a sharp object to clean underneath your toenails. Use the blunt end of a nail file or other rounded tool.  ?? Trim and file your toenails straight across to prevent ingrown toenails. Use a nail clipper, not scissors. Use an emery board to smooth the edges.  ?? Change socks daily. Socks without seams are best, because seams often rub the feet. You can find socks for people with diabetes from specialty catalogs.  ?? Look inside your shoes every day for things like gravel or torn linings, which could cause blisters or sores.  ?? Buy shoes that fit well:  ?? Look for shoes that have plenty of space around the toes. This helps prevent bunions and blisters.  ?? Try on shoes while wearing the kind of socks you will usually wear with the shoes.  ?? Avoid plastic shoes. They may rub your feet and cause blisters. Good shoes should be made of materials that are flexible and breathable, such as leather or cloth.  ?? Break in new shoes slowly by wearing  them for no more than an hour a day for several days. Take extra time to check your feet for red areas, blisters, or other problems after you wear new shoes.  ?? Do not go barefoot. Do not wear sandals, and do not wear shoes with very thin soles. Thin soles are easy to puncture. They also do not protect your feet from hot pavement or cold weather.  ?? Have your doctor check your feet during each visit. If you have a foot problem, see your doctor. Do not try to treat an early foot problem at home. Home remedies or treatments that you can buy without a prescription (such as corn removers) can be harmful.  ?? Always get early treatment for foot problems. A minor irritation can lead to a major problem if not properly cared for early.  When should you call for help?  Call your doctor now or seek immediate medical care if:  ?? You have a foot sore, an ulcer or break in the skin that is not healing after 4 days, bleeding corns or calluses, or an ingrown toenail.  ?? You have blue or black areas, which can mean bruising or blood flow problems.  ?? You have peeling skin or tiny blisters between your toes or cracking or oozing of the skin.  ?? You have a fever for more than 24 hours and a foot sore.  ?? You have new numbness or tingling in your feet that does not go away after you move your feet or change positions.  ?? You have unexplained or unusual swelling of the foot or ankle.  Watch closely for changes in your health, and be sure to contact your doctor if:  ?? You cannot do proper foot care.  Where can you learn more?  Go to https://myuncchart.org  Enter A739 in the search box to learn more about Diabetes Foot Health: Care Instructions.  ?? 2006-2016 [employer]. Care instructions adapted under license by [facility]. This care instruction is for use with your licensed healthcare professional. If you have questions about a medical condition or this instruction, always ask your healthcare professional. [employer] disclaims any warranty or liability for your use of this information.  Content Version: 11.0.578772; Current as of: Oct 25, 2014

## 2017-08-27 NOTE — Unmapped (Signed)
Transplant Nephrology Clinic Visit    History of Present Illness    Transplant History:  David Ellis is 72 y.o. 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  He has no history of rejection, urinary protein has been consistently normal, and serum creatinine has been stable in the range of approximately 1.3-1.8 mg/dL. 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    His posttransplant course was complicated by a pulmonary embolus in December 2014, foot infections necessitating amputation of 2 toes, and a stroke in February 2017.    Interval history:   Patient underwent several debridements of recurrent left foot diabetic ulcer and treatment for infection. He states the ulcer is currently healed and he is scheduled to be seen in wound clinic today. Diarrhea has largely resolved with only occasional loose stools, lasting 1-2 days, and resolving spontaneously. Appetite remains normal. He denies fever, nausea or vomiting.     He is complaining of right hip pain. He states this pain is not similar to when he had a DVT as its location is in the hip. He is scheduled to see wound clinic today. Patient continues to follow with endocrinology for management of his diabetes. Most recent A1c from February 2019 was 9.0%. At home blood pressure has been normal. Denies urinary symptoms, chest pain or shortness of breath.       Review of Systems    All other systems are reviewed and are negative. A 10 systems review is completed.    Medications    Current Outpatient Prescriptions   Medication Sig Dispense Refill   ??? aspirin (ECOTRIN) 81 MG tablet Take 81 mg by mouth continuous as needed. Only when flying and/or traveling by car more than 2 hours.     ??? BD INSULIN SYRINGE ULTRA-FINE 0.3 mL 31 gauge x 15/64 Syrg 1 each by Other route Three (3) times a day. 300 Syringe 3   ??? BD ULTRA-FINE NANO PEN NEEDLE 32 gauge x 5/32 Ndle USE UP TO 3 TIMES DAILY 100 each 11   ??? blood sugar diagnostic (FREESTYLE PRECISION NEO STRIPS) Strp Use to test blood sugar 4 times daily. Dx E11.65. 150 each 5   ??? chlorthalidone (HYGROTON) 25 MG tablet TAKE ONE TABLET EVERY DAY 60 tablet 0   ??? cholecalciferol, vitamin D3, (VITAMIN D3) 2,000 unit Tab Take 2,000 Units by mouth daily.      ??? furosemide (LASIX) 40 MG tablet Take 1-2 tablets once daily 180 tablet 3   ??? gabapentin (NEURONTIN) 300 MG capsule TAKE 1 CAPSULE BY MOUTH TWICE DAILY 90 capsule 0   ??? gabapentin (NEURONTIN) 300 MG capsule TAKE 1 CAPSULE BY MOUTH TWICE DAILY 180 capsule 0   ??? HUMULIN R U-500, CONC, KWIKPEN 500 unit/mL (3 mL) CONCENTRATED injection DIAL TO MAX 100 UNITS BEFORE [facility] AND DINNER.INJECT 30 MINS BEFORE MEALS.MAX OF 300 UNITS DAILY 18 mL 11   ??? irbesartan (AVAPRO) 150 MG tablet Take 1 tablet (150 mg total) by mouth nightly. 90 tablet 3   ??? lancets Misc 1 each by Miscellaneous route Four (4) times a day. 150 each 6   ??? magnesium chloride (SLOW_MAG) 64 mg TbEC Take 128 mg by mouth three (3) times a day (at 6am, noon and 6pm).      ??? metoprolol succinate (TOPROL-XL) 200 MG 24 hr tablet TAKE ONE TABLET BY MOUTH TWICE DAILY 180 tablet  3   ??? multivitamin capsule Take 1 capsule by mouth daily.     ??? mycophenolate (MYFORTIC) 180 MG EC tablet Take 3 tablets (540 mg total) by mouth Two (2) times a day. 540 tablet 3   ??? pravastatin (PRAVACHOL) 20 MG tablet Take 1 tablet (20 mg total) by mouth daily. 90 tablet 3   ??? tacrolimus (PROGRAF) 1 MG capsule Take 3 capsules (3 mg total) by mouth Two (2) times a day. Z94.0 540 capsule 3   ??? warfarin (COUMADIN) 5 MG tablet Take 5 mg every day except mondays (Patient taking differently: 5 mg. Take 5 mg every day except mondays) 30 tablet 6   ??? blood-glucose meter kit Use as instructed. 1 each 0   ??? glucagon, human recombinant, (GLUCAGON) 1 mg injection Follow package directions for low blood sugar. (Patient not taking: Reported on 08/27/2017) 1 mg 1   ??? hydrocortisone 2.5 % cream   0   ??? ketoconazole (NIZORAL) 2 % cream   0   ??? silver sulfaDIAZINE (SILVADENE) 1 % cream Apply topically daily. (Patient not taking: Reported on 07/19/2017) 50 g 0     No current facility-administered medications for this visit.      Facility-Administered Medications Ordered in Other Visits   Medication Dose Route Frequency Provider Last Rate Last Dose   ??? bevacizumab    PRN (once a day) Melvyn Novas, MD   2.5 mg at 01/28/17 0750       Physical Exam    BP 92/52 (BP Site: L Arm, BP Position: Sitting, BP Cuff Size: Large)  - Pulse 60  - Temp 36.8 ??C (98.2 ??F) (Temporal)  - Ht 185.4 cm (6' 0.99)  - Wt (!) 123.5 kg (272 lb 3.2 oz)  - BMI 35.92 kg/m??   General: Patient is a overweight pleasant male in no apparent distress. Noted well healing surgical site over right nasal bridge. Nontoxic.   Eyes: Sclera anicteric.  Neck: Supple without LAD/JVD/bruits.  Lungs: Clear to auscultation bilaterally, no wheezes/rales/rhonchi.  Cardiovascular: bradycardic, regular, no murmurs.  Abdomen: graft site appears well, overweight, nontender, soft.  Extremities: Status post left foot 4th and 5th toe ray amputation, noted 2+ edema bilaterally   Skin: Without rash.  Foot wound sites are healed.  Neurological: Grossly nonfocal.  Psychiatric: Mood and affect appropriate.    Laboratory Results    Recent Results (from the past 170 hour(s))   Phosphorus Level    Collection Time: 08/27/17  8:17 AM   Result Value Ref Range    Phosphorus 3.6 2.9 - 4.7 mg/dL   Magnesium Level    Collection Time: 08/27/17  8:17 AM   Result Value Ref Range    Magnesium 1.9 1.6 - 2.2 mg/dL   Comprehensive Metabolic Panel    Collection Time: 08/27/17  8:17 AM   Result Value Ref Range    Sodium 136 135 - 145 mmol/L    Potassium 4.3 3.5 - 5.0 mmol/L    Chloride 99 98 - 107 mmol/L    CO2 25.0 22.0 - 30.0 mmol/L    BUN 49 (H) 7 - 21 mg/dL    Creatinine 0.98 (H) 0.70 - 1.30 mg/dL BUN/Creatinine Ratio 30     EGFR MDRD Non Af Amer 41 (L) >=60 mL/min/1.70m2    EGFR MDRD Af Amer 50 (L) >=60 mL/min/1.13m2    Anion Gap 12 9 - 15 mmol/L    Glucose 272 (H) 65 - 179 mg/dL    Calcium 8.7 8.5 -  10.2 mg/dL    Albumin 3.6 3.5 - 5.0 g/dL    Total Protein 6.1 (L) 6.5 - 8.3 g/dL    Total Bilirubin 0.6 0.0 - 1.2 mg/dL    AST 26 19 - 55 U/L    ALT 32 19 - 72 U/L    Alkaline Phosphatase 77 38 - 126 U/L   CBC w/ Differential    Collection Time: 08/27/17  8:17 AM   Result Value Ref Range    WBC 6.4 4.5 - 11.0 10*9/L    RBC 5.36 4.50 - 5.90 10*12/L    HGB 15.1 13.5 - 17.5 g/dL    HCT 16.1 09.6 - 04.5 %    MCV 84.7 80.0 - 100.0 fL    MCH 28.1 26.0 - 34.0 pg    MCHC 33.2 31.0 - 37.0 g/dL    RDW 40.9 81.1 - 91.4 %    MPV 8.0 7.0 - 10.0 fL    Platelet 213 150 - 440 10*9/L    Neutrophils % 77.3 %    Lymphocytes % 14.2 %    Monocytes % 5.2 %    Eosinophils % 1.2 %    Basophils % 0.5 %    Absolute Neutrophils 5.0 2.0 - 7.5 10*9/L    Absolute Lymphocytes 0.9 (L) 1.5 - 5.0 10*9/L    Absolute Monocytes 0.3 0.2 - 0.8 10*9/L    Absolute Eosinophils 0.1 0.0 - 0.4 10*9/L    Absolute Basophils 0.0 0.0 - 0.1 10*9/L    Large Unstained Cells 2 0 - 4 %   PT-INR    Collection Time: 08/27/17  8:17 AM   Result Value Ref Range    PT 34.1 (H) 10.2 - 12.8 sec    INR 2.99    PTH    Collection Time: 08/27/17  8:17 AM   Result Value Ref Range    PTH 133.6 (H) 12.0 - 72.0 pg/mL    Calcium 8.9 8.5 - 10.2 mg/dL       Assessment and Plan    Broadus Costilla is a 72 y.o. male s/p LD kidney transplant 08/21/2011. Active medical issues include:    1.  Status post living unrelated donor kidney transplant with stable allograft function and prior biopsies revealing moderate to severe arteriosclerosis. The patient has a history of a DQ 7 donor specific antibody noted on 09/17/2011 with all subsequent HLA antibody screens revealing no DSA's. Serum creatinine today is 1.66 mg/dL which is within his baseline range of 1.3-1.8 mg/dL. UP/C remains normal. Baseline creatinine has been creeping up over past 2 years but not to a degree to warrant repeat biopsy.    2.  Immunosuppression management.  Patient is tolerating his current immunosuppressive regimen well. His tacrolimus target level is 6-10 and today's level of 7.4 is within the target range.  Mycophenolate will be continued at a dosage of 540 mg twice daily.    3. Intermittent diarrhea. This is relatively infrequent. No further work-up is planned.Patient reports occasional recurrence of symptoms, however this is manageable.       4. Right hip pain. X-ray today does not suggest avascular necrosis. It is unclear if the pain is secondary to primary hip pathology or spine disease with radiculpathy. If persistent he may need to be seen by ortho.      5.  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 His wounds are healed today. Patient will continue to follow in the vascular clinic.  6. Hypertension. Blood pressure is 92/58 in clinic today. He remains asymptomatic. I will not make any antihypertensive medication changes. Will continue to monitor and patient will contact our clinic should he dizzy or weak.    7.  History of  DVT and pulmonary embolus. Patient will continue Coumadin.      8.  Diabetes mellitus.  Hemoglobin A1c 9.0% on 07/2017. He will follow with his endocrinologist.     9.  Atrial fibrillation.  Patient is on Coumadin and rate is controlled with metoprolol.    10. CVA. status post alteplase 07/2015. No residual deficits. On coumadin.    11.  Immunizations.  Patient received first dose of shingrix vaccine today. Will get booster in clinic with Dr. Arvin Collard in 3 months if available.     12.  Hyperparathyroidism and bone and mineral metabolism.  PTH is  133.6 and serum calcium is within normal limits.  Patient does not meet criteria for initiation of Sensipar.    13.  Follow-up.  Patient will continue to follow with Dr. Dellis Filbert and Dr. Arvin Collard . He is scheduled to see dermatology June 2019 and continues to follow biannually, given his long history of sun exposure with his profession.      Scribe's Attestation: Jackey Loge, MD obtained and performed the history, physical exam and medical decision making elements that were entered into the chart.  Signed by Jerl Santos, Scribe, on August 27, 2017 at 9:38 AM.    ----------------------------------------------------------------------------------------------------------------------  August 27, 2017 4:16 PM. Documentation assistance provided by the Scribe. I was present during the time the encounter was recorded. The information recorded by the Scribe was done at my direction and has been reviewed and validated by me.  ----------------------------------------------------------------------------------------------------------------------

## 2017-08-27 NOTE — Unmapped (Signed)
A urine specimen was collected at the visit.

## 2017-08-27 NOTE — Unmapped (Signed)
BP 156/59  - Pulse 57  - Temp 36.5 ??C (97.7 ??F) (Temporal)  - Ht 185.4 cm (6' 1)  - Wt (!) 123.4 kg (272 lb) Comment: stated - BMI 35.89 kg/m??     This is a patient who underwent amputation of the lateral portion of the left foot.  This was open for a while but it is subsequently healed.    On physical exam the patient is awake alert in no acute distress.  He is able to communicate without difficulty    Vital signs are reviewed and the patient's afebrile    The patient's wound is healed.  We will see him back in clinic on a as needed basis.

## 2017-08-28 LAB — CMV DNA, QUANTITATIVE, PCR: CMV VIRAL LD: NOT DETECTED

## 2017-08-28 LAB — CMV COMMENT: Lab: 0

## 2017-08-28 NOTE — Unmapped (Signed)
Pt called and informed of lab results. He is currently on the lab schedule for April 29th, but unsure what labs need to be drawn at that time. Pt stated that his wife will call back to clarify the appt b/c she makes all of his appointments

## 2017-08-28 NOTE — Unmapped (Signed)
-----   Message from Larene Beach, MD sent at 08/27/2017  8:04 PM EDT -----  INR is stable.  Continue present dose and repeat INR in two weeks.

## 2017-09-02 ENCOUNTER — Ambulatory Visit: Admit: 2017-09-02 | Discharge: 2017-09-02 | Payer: MEDICARE

## 2017-09-02 DIAGNOSIS — L97529 Non-pressure chronic ulcer of other part of left foot with unspecified severity: Secondary | ICD-10-CM

## 2017-09-02 DIAGNOSIS — R238 Other skin changes: Secondary | ICD-10-CM

## 2017-09-02 DIAGNOSIS — E11621 Type 2 diabetes mellitus with foot ulcer: Principal | ICD-10-CM

## 2017-09-02 LAB — CBC W/ AUTO DIFF
BASOPHILS RELATIVE PERCENT: 0.4 %
EOSINOPHILS ABSOLUTE COUNT: 0.2 10*9/L (ref 0.0–0.4)
EOSINOPHILS RELATIVE PERCENT: 2.4 %
HEMATOCRIT: 44.2 % (ref 41.0–53.0)
HEMOGLOBIN: 14.4 g/dL (ref 13.5–17.5)
LARGE UNSTAINED CELLS: 3 % (ref 0–4)
LYMPHOCYTES ABSOLUTE COUNT: 1.2 10*9/L — ABNORMAL LOW (ref 1.5–5.0)
LYMPHOCYTES RELATIVE PERCENT: 15.8 %
MEAN CORPUSCULAR HEMOGLOBIN CONC: 32.6 g/dL (ref 31.0–37.0)
MEAN CORPUSCULAR HEMOGLOBIN: 27.9 pg (ref 26.0–34.0)
MEAN CORPUSCULAR VOLUME: 85.7 fL (ref 80.0–100.0)
MEAN PLATELET VOLUME: 8.7 fL (ref 7.0–10.0)
MONOCYTES ABSOLUTE COUNT: 0.6 10*9/L (ref 0.2–0.8)
NEUTROPHILS ABSOLUTE COUNT: 5.3 10*9/L (ref 2.0–7.5)
NEUTROPHILS RELATIVE PERCENT: 70.8 %
PLATELET COUNT: 242 10*9/L (ref 150–440)
RED BLOOD CELL COUNT: 5.16 10*12/L (ref 4.50–5.90)
RED CELL DISTRIBUTION WIDTH: 13.7 % (ref 12.0–15.0)
WBC ADJUSTED: 7.5 10*9/L (ref 4.5–11.0)

## 2017-09-02 LAB — SEDIMENTATION RATE, MANUAL: ERYTHROCYTE SEDIMENTATION RATE: 23 mm/h — ABNORMAL HIGH (ref 0–20)

## 2017-09-02 LAB — PLATELET COUNT: Lab: 242

## 2017-09-02 LAB — C-REACTIVE PROTEIN: C reactive protein:MCnc:Pt:Ser/Plas:Qn:: 16.1 — ABNORMAL HIGH

## 2017-09-02 LAB — ERYTHROCYTE SEDIMENTATION RATE: Lab: 23 — ABNORMAL HIGH

## 2017-09-02 MED ORDER — DOXYCYCLINE HYCLATE 100 MG TABLET/CAPSULE WRAPPER
ORAL_TABLET | Freq: Two times a day (BID) | ORAL | 0 refills | 0 days | Status: CP
Start: 2017-09-02 — End: 2017-09-12

## 2017-09-02 NOTE — Unmapped (Addendum)
Today during your visit Dr. Melburn Hake has ordered for you:    1. X-rays of the left foot.  [facility]  Address: [address] [place], [place] [address]    2. WOUND CARE INSTRUCTIONS:        Wash area with Antibacterial liquid soap with one wash cloth, clean the soap off with a clean cloth, and then pat dry with a dry cloth before the dressing change.    Dressing: paint toe with Betadine and cover with a band-aid.        If you have any questions or concerns regarding your wound or wound care, please contact us at the [facility] clinic at [phone].      If your wound starts to develop the following , please call the [facility] for further advise:    ??  Increased drainage  ??  Redness around the wound  ??  Strong odor from the wound when changing the bandages  ??  Increased pain    Please do not hesitate to leave a voicemail on the nurse line. We make every effort to return your call the same day or the next day. Please leave a clear message with your name, date of birth,  and your medical record number. Leave a brief description of your problem.    If you are experiencing the following, please call us for advise or consider going to the nearest local Emergency Department or call 911.    ??  Fever of 100 F  ??  Nausea or Vomitting  ??  Pus draining from your wound  ??  Redness of the whole foot or leg  ??  Severe increase in pain above your baseline.    [facility]  [phone]

## 2017-09-02 NOTE — Unmapped (Signed)
[facility] Return Visit Note        SUBJECTIVE:         History of Present Illness    David Ellis is a 72 y.o. male, with a history of Kidney transplant (on tacrolimus), DM II, HTN, PAD, left foot amputation and A fib (on warfarin) who returns to the [facility] with left great toe ulcer that appeared 3 days ago.  He's uncertain how this started but he says he uses diabetic shoes or bedroom shoes (at home) consistently.  The blister is intact but the perilesional area is red.  He denies fever, chills, diaphoresis, malaise, nausea, or vomiting.      Past Medical, Family, and Social History    The following portions of the patient's history were reviewed and updated as appropriate: allergies, current medications, past medical history and problem list.    Review of Systems    The following systems were reviewed and found to be negative: Constitutional and Respiratory.  See HPI for other pertinent ROS.            OBJECTIVE:        Physical Exam  Vitals: BP 124/57  - Pulse 55  - Temp 36.6 ??C (97.9 ??F) (Temporal)    General Apperance: obese, comfortable and no acute distress   Eyes: pink conjunctivae, anicteric sclerae and lids clear   ENT / Mouth: ears and nose without external deformities, masses, or lesions and hearing grossly intact   Neck: neck symmetric, trachea midline   Respiratory:  normal respiratory effort   Cardiovascular:  Pedal pulses audible on handheld doppler exam   Musculoskeletal:  no clubbing, cyanosis, effusion   Skin:  Intact hemorrhagic blister.  After puncturing the intact bulla, thick sangiunous exudate noted but no purulence. Proximal and lateral nailfolds are erythematous and warm but no purulence, fluctuance or pus pockets. No probing to bone   Neurologic:  Neuropathy previously noted   Psychiatric:  normal mood, appropriate affect     Wound 07/19/15 left lateral foot scattered (Active)       Wound 08/25/15 Foot Left Bottom Lateral Fissure (Active)       Wound 09/14/16 left lateral foot (Active)       Diagnostic Data  Labs / Tests:    Results for orders placed or performed in visit on 08/27/17   Urine Culture   Result Value Ref Range    Urine Culture, Comprehensive NO GROWTH    Urinalysis   Result Value Ref Range    Color, UA Yellow     Clarity, UA Clear     Specific Gravity, UA 1.015 1.003 - 1.030    pH, UA 5.0 5.0 - 9.0    Leukocyte Esterase, UA Negative Negative    Nitrite, UA Negative Negative    Protein, UA Negative Negative    Glucose, UA Negative Negative    Ketones, UA Negative Negative    Urobilinogen, UA 0.2 mg/dL 0.2 mg/dL, 1.0 mg/dL    Bilirubin, UA Negative Negative    Blood, UA Negative Negative    RBC, UA <1 <=3 /HPF    WBC, UA 1 <=2 /HPF    Squam Epithel, UA <1 0 - 5 /HPF    Bacteria, UA None Seen None Seen /HPF    Hyaline Casts, UA 2 (H) 0 - 1 /LPF   Protein/Creatinine Ratio, Urine   Result Value Ref Range    Creat U 127.2 Undefined mg/dL  Protein, Ur <4.0 Undefined mg/dL    Protein/Creatinine Ratio, Urine  Undefined   Cytology-Urine   Result Value Ref Range    Case Report       Non-gynecologic Cytology                          Case: [id number]                                 Authorizing Provider:  Clayborne Artist, MD   Collected:           08/27/2017 0853              Ordering Location:     [facility] Received:            08/28/2017 1323                                     [address] [place]                                                    Pathologist:           Elise Benne, MD                                                        Specimen:    Urine, voided, decoys                                                                      Final Diagnosis       Urine, voided  - No malignant cells identified  - No decoy cells identified  - Degenerated specimen  - Acute inflammation      This electronic signature is attestation that the pathologist personally reviewed the submitted material(s) and the final diagnosis reflects that evaluation.      Clinical History       The patient is a 72 year old male with history of kidney transplant.  Urine for Decoy cells, s/p Kidney Transplant      Gross Description       Urine, voided  40 mL yellow fluid, unfixed    Materials Prepared & Examined:    Smear Slides???..0  Monolayers??????.1  Cytospins?????????0  Cell Blocks???.???.0  Core Biopsy???....0  Touch Prep???.???0        Microscopic Description       Microscopic examination substantiates the above diagnosis.    Resident Physician: None Assigned      EMBEDDED IMAGES      Specimen Adequacy Satisfactory for evaluation     Disclaimer       Unless otherwise specified, specimens are preserved using 10% neutral buffered formalin. For cases in which immunohistochemical and/or in-situ  hybridization stains are performed, the following statement applies: Appropriate controls for each stain (positive controls with or without negative controls) have been evaluated and stain as expected. These stains have not been separately validated for use on decalcified specimens and should be interpreted with caution in that setting. Some of the reagents used for these stains may be classified as analyte specific reagents (ASR). Tests using ASRs were developed, and their performance characteristics were determined, by the Anatomic Pathology Department [facility]). They have not been cleared or approved by the [place] Food and Drug Administration (FDA). The FDA does not require these tests to go through premarket FDA review. These tests are used for clinical purposes. They should not be regarded as investigational or for research. This laboratory is certified under the Clinical Laboratory Improvement Amendments (CLIA) as qualified to perform high complexity clinical laboratory testing.       *Note: Due to a large number of results and/or encounters for the requested time period, some results have not been displayed. A complete set of results can be found in Results Review.     Imaging:  Radiology studies were personally reviewed          ASSESSMENT / PLAN:      72 y.o. male, with T2DM, neuropathy, S/P kidney transplant and currently immunesuppressed, presenting with:    ##   L great toe diabetic hemorrhagic bulla, with mild clinical infection  ?? Dressings: betadine, then bandaid - change daily  ?? 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 Offloading: DH shoe. Stop diabetic shoes for now  ?? Xray ordered - r/o osteo  ?? Arterial PVLs: 11/2016 - Left with runoff disease, toe pressure 92  ?? Red flags reviewed; ED precautions given  ?? PROCEDURE: puncture of hemorrhagic bulla performed using #15 blade, aerobic cx sent. Pt tolerated procedure well. No complications.   ?? RTC in 1 wk or sooner prn

## 2017-09-02 NOTE — Unmapped (Signed)
Addended by: Benjiman Core on: 09/02/2017 03:02 PM     Modules accepted: Orders

## 2017-09-05 NOTE — Unmapped (Signed)
unos form

## 2017-09-05 NOTE — Unmapped (Signed)
Opened in error

## 2017-09-10 ENCOUNTER — Ambulatory Visit: Admit: 2017-09-10 | Discharge: 2017-09-10 | Payer: MEDICARE | Attending: Vascular Surgery | Primary: Vascular Surgery

## 2017-09-10 ENCOUNTER — Ambulatory Visit: Admit: 2017-09-10 | Discharge: 2017-09-10 | Payer: MEDICARE

## 2017-09-10 DIAGNOSIS — Z940 Kidney transplant status: Secondary | ICD-10-CM

## 2017-09-10 DIAGNOSIS — M25551 Pain in right hip: Principal | ICD-10-CM

## 2017-09-10 DIAGNOSIS — I4891 Unspecified atrial fibrillation: Principal | ICD-10-CM

## 2017-09-10 DIAGNOSIS — I739 Peripheral vascular disease, unspecified: Principal | ICD-10-CM

## 2017-09-10 NOTE — Unmapped (Signed)
BP 159/69  - Pulse 58  - Temp 36.6 ??C (97.8 ??F) (Temporal)     This is a patient who had osteomyelitis of.  He underwent an amputation.  Of the lateral portion of the foot.    More recently probably secondary to minor trauma he developed a small ulcer and cellular toe.  He had an x-ray taken which showed no evidence of osteo-mild    He was treated with antibiotics in the cellulite    On physical exam the patient is awake alert in no acute distress.  He is able to communicate without difficulty    Vital signs are reviewed and the patient's afebrile    The wound is healed.  There is no ulcer present and no drainage.    Impression healed wound great toe    Plan we will see him back on a as needed basis.

## 2017-09-10 NOTE — Unmapped (Signed)
ORTHOPAEDIC NOTE       Farrah Skoda L. 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  Advised to discontinue analgesic and call PCP or got to ER if any stomach pain or blood in the stool is noted.  -Discussed treatment options and patient was amenable to the above plan and was instructed to call and be seen if there is any increasing pain or concerns.    Follow up:  -  As needed per patient's request     Chief Complaint:     Right hip pain     SUBJECTIVE:     HPI: David Ellis is a 72 y.o. male presenting to Clinic with his medical history of diabetes, pulmonary embolism, atrial fibrillation, peripheral artery disease currently being treated for left foot infection complaining of right hip pain referrable to low back pain rated as 4/10 that is chronically but exacerbated the past 8 months after he has had been immobilized in a boot on his left foot due to ulceration.  He is currently on antibiotics.  He is having pain rating down his right leg.   Pain is worse with standing and shaving and improved with leg elevation.   complains of numbness, tingling, but not weakness Right lower extremity.  denies fever, chills, night sweats.  denies bowel or bladder changes  Has no other previous history of trauma or surgery to the low back.       Allergies  Allergies   Allergen Reactions   ??? Enalapril Other (See Comments)     cough  cough   ??? Grass Pollen-[place], Standard      Other reaction(s): ITCHING   ??? Levofloxacin Other (See Comments)     Other reaction(s): Unknown  Unknown   ??? Lidocaine Other (See Comments)     I don't remember States is fine with Bupivicaine   ??? Penicillins      As child  Other reaction(s): UNKNOWN.   (UPDATE: Tolerated amp/sulbactam without side effects during 10/2014 hospitalization)   ??? Egg Derived Rash     rash  rash   ??? Epinephrine Palpitations     Heart palpitations   ??? Lisinopril Rash   ??? Mepivacaine Hcl Palpitations     unspecified      Medicaions  Current Outpatient Medications   Medication Sig Dispense Refill   ??? aspirin (ECOTRIN) 81 MG tablet Take 81 mg by mouth continuous as needed. Only when flying and/or traveling by car more than 2 hours.     ??? BD INSULIN SYRINGE ULTRA-FINE 0.3 mL 31 gauge x 15/64 Syrg 1 each by Other route Three (3) times a day. 300 Syringe 3   ??? BD ULTRA-FINE NANO PEN NEEDLE 32 gauge x 5/32 Ndle USE UP TO 3 TIMES DAILY 100 each 11   ??? blood  sugar diagnostic (FREESTYLE PRECISION NEO STRIPS) Strp Use to test blood sugar 4 times daily. Dx E11.65. 150 each 5   ??? blood-glucose meter kit Use as instructed. 1 each 0   ??? chlorthalidone (HYGROTON) 25 MG tablet TAKE ONE TABLET EVERY DAY 60 tablet 0   ??? cholecalciferol, vitamin D3, (VITAMIN D3) 2,000 unit Tab Take 2,000 Units by mouth daily.      ??? doxycycline (VIBRA-TABS) 100 MG tablet/capsule Take 1 each (100 mg total) by mouth Two (2) times a day. for 10 days 20 tablet 0   ??? furosemide (LASIX) 40 MG tablet Take 1-2 tablets once daily 180 tablet 3   ??? gabapentin (NEURONTIN) 300 MG capsule TAKE 1 CAPSULE TWICE DAILY 180 capsule 5   ??? glucagon, human recombinant, (GLUCAGON) 1 mg injection Follow package directions for low blood sugar. (Patient not taking: Reported on 08/27/2017) 1 mg 1   ??? HUMULIN R U-500, CONC, KWIKPEN 500 unit/mL (3 mL) CONCENTRATED injection DIAL TO MAX 100 UNITS BEFORE [facility] AND DINNER.INJECT 30 MINS BEFORE MEALS.MAX OF 300 UNITS DAILY 18 mL 11   ??? hydrocortisone 2.5 % cream   0   ??? irbesartan (AVAPRO) 150 MG tablet Take 1 tablet (150 mg total) by mouth nightly. 90 tablet 3   ??? ketoconazole (NIZORAL) 2 % cream   0   ??? lancets Misc 1 each by Miscellaneous route Four (4) times a day. 150 each 6   ??? magnesium chloride (SLOW_MAG) 64 mg TbEC Take 128 mg by mouth three (3) times a day (at 6am, noon and 6pm).      ??? metoprolol succinate (TOPROL-XL) 200 MG 24 hr tablet TAKE ONE TABLET BY MOUTH TWICE DAILY 180 tablet 3   ??? multivitamin capsule Take 1 capsule by mouth daily.     ??? mycophenolate (MYFORTIC) 180 MG EC tablet Take 3 tablets (540 mg total) by mouth Two (2) times a day. 540 tablet 3   ??? pravastatin (PRAVACHOL) 20 MG tablet Take 1 tablet (20 mg total) by mouth daily. 90 tablet 3   ??? silver sulfaDIAZINE (SILVADENE) 1 % cream Apply topically daily. (Patient not taking: Reported on 07/19/2017) 50 g 0   ??? tacrolimus (PROGRAF) 1 MG capsule Take 3 capsules (3 mg total) by mouth Two (2) times a day. Z94.0 540 capsule 3   ??? warfarin (COUMADIN) 5 MG tablet Take 5 mg every day except mondays (Patient taking differently: 5 mg. Take 5 mg every day except mondays) 30 tablet 6     No current facility-administered medications for this visit.      Facility-Administered Medications Ordered in Other Visits   Medication Dose Route Frequency Provider Last Rate Last Dose   ??? bevacizumab    PRN (once a day) Melvyn Novas, MD   2.5 mg at 01/28/17 0750      Past Medical HIstory  Past Medical History:   Diagnosis Date   ??? Atrial flutter (CMS-HCC)    ??? Diabetes mellitus (CMS-HCC)    ??? Diabetic nephropathy (CMS-HCC)    ??? Diabetic retinopathy (CMS-HCC)    ??? Fractures    ??? Ganglion cyst    ??? Hand injury    ??? Heart disease    ??? Hyperlipidemia    ??? Hypertension    ??? Joint pain    ??? Osteomyelitis (CMS-HCC) June 2016   ??? Pulmonary embolism (CMS-HCC) Dec 2015   ??? 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 Squamous cell skin cancer June  2015   ??? Stroke (CMS-HCC)    ??? Tear of meniscus of knee    ??? 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 NEPHRECTOMY TRANSPLANTED ORGAN Right     LURD - spouse received in 2013.   ??? PR AMPUTATION METATARSAL+TOE,SINGLE Left 07/06/2014    Procedure: AMPUTATION, METATARSAL, WITH TOE SINGLE;  Surgeon: Marion Downer, MD;  Location: MAIN OR [facility];  Service: Vascular   ??? PR AMPUTATION METATARSAL+TOE,SINGLE Left 10/28/2014    Procedure: AMPUTATION, METATARSAL, WITH TOE SINGLE;  Surgeon: Maple Mirza, MD;  Location: MAIN [facility];  Service: Vascular   ??? PR VITRECTOMY,PANRETINAL LASER RX Right 11/26/2016    Procedure: VITRECTOMY, MECHANICAL, PARS PLANA APPROACH; WITH ENDOLASER PANRETINAL PHOTOCOAGULATION;  Surgeon: Melvyn Novas, MD;  Location: [facility] OR [facility];  Service: Ophthalmology   ??? PR VITRECTOMY,PANRETINAL LASER RX Left 01/28/2017    Procedure: VITRECTOMY, MECHANICAL, PARS PLANA APPROACH; WITH ENDOLASER PANRETINAL PHOTOCOAGULATION;  Surgeon: Melvyn Novas, MD;  Location: [facility] OR [facility];  Service: Ophthalmology   ??? SKIN BIOPSY        Family HIstory  Family History   Problem Relation Age of Onset   ??? Kidney disease Mother    ??? Diabetes Mother    ??? Kidney disease Father    ??? Kidney disease Maternal Grandmother    ??? Diabetes Maternal Grandmother       Social History        Occupational History   ??? Not on file     Social History     Socioeconomic History   ??? Marital status: Married     Spouse name: Not on file   ??? Number of children: Not on file   ??? Years of education: Not on file   ??? Highest education level: Not on file   Occupational History   ??? Not on file   Social Needs   ??? Financial resource strain: Not on file   ??? Food insecurity:     Worry: Not on file     Inability: Not on file   ??? Transportation needs:     Medical: Not on file     Non-medical: Not on file   Tobacco Use   ??? Smoking status: Never Smoker   ??? Smokeless tobacco: Never Used   Substance and Sexual Activity   ??? Alcohol use: No   ??? Drug use: No   ??? Sexual activity: Not on file   Lifestyle   ??? Physical activity:     Days per week: Not on file     Minutes per session: Not on file   ??? Stress: Not on file   Relationships   ??? Social connections:     Talks on phone: Not on file     Gets together: Not on file     Attends religious service: Not on file     Active member of club or organization: Not on file     Attends meetings of clubs or organizations: Not on file     Relationship status: Not on file   ??? Intimate partner violence:     Fear of current or ex partner: Not on file     Emotionally abused: Not on file     Physically abused: Not on file     Forced sexual activity: Not on file   Other Topics Concern   ??? Do you use sunscreen? Yes  Comment: long sleeves & floppy hat   ??? Tanning bed use? No   ??? Excessive sun exposure? No   ??? Blistering sunburns? No   Social History Narrative    Works for the city of [employer].    Retired from the [employer].      Married.      Two healthy children.          Review of Systems  Review of systems was negative except for pertinent items noted in the HPI  (!) Joint pain.       PHYSICAL EXAM:     General: Well developed, well nourished, in no acute distress.  Alert & oriented x 3.   Appropriate Mood and affect  Gait: Antalgic and guarded.   Neuro: Decreased sensation Bilateral lower extremity consistent with neuropathy  DTR: Non pathologic Right patella, achilles tendon reflex  Cardiovascular: Decreased  Bilateral dorsal pedalis pulse  Skin: No scars, rashes or lesions.     MSK: Lumbar Spine       Inspection: No edema. No erythema, ecchymosis, skin intact.       Palpation: Right SI joint and sciatic notch tenderness. No other spinal process, paravertebral, iliac crest, sciatic notch, SI joint or CVA tenderness       Range of motion:  moderately restricted flexion. mildly restricted extension.  moderately restricted lateral bending.  mildly restricted rotation.  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 No other obvious fractures, lucencies, dislocations, or abnormalities.    cc:  [name], MD  *Patient note was created using Dragon Dictation sotware. Errors in syntax or grammar may not have been identified and edited on initial review.

## 2017-09-10 NOTE — Unmapped (Signed)
Patient came in today for POC INR visit which read 3.3. Patient is currently taking 5 mg daily. Denies any missed doses, diet changes, bleeding. Patient is currently taking antibiotics to treat a foot infection.    Last INR was 2.8 on 08/14/17. At that time we did not change above dosing.    Patient advised to alternate 5 mg and 2.5 mg every other day and recheck INR in 1 week per Dr. Dellis Filbert.

## 2017-09-10 NOTE — Unmapped (Signed)
Confirmed.  [name], M.D.     [facility] at [facility]   [address]  [address]  [place], [place]  [address]  [phone]

## 2017-09-11 NOTE — Unmapped (Signed)
[facility] Refill Coordination Note  Specialty Medication(s): Prograf, Myfortic  Additional Medications shipped: na    David Ellis, DOB: 28-Aug-1945  Phone: [phone] (home) [phone] (work), Alternate phone contact: N/A  Phone or address changes today?: No  All above HIPAA information was verified with patient.  Shipping Address: [address]  [place] [address]   Insurance changes? No    Completed refill call assessment today to schedule patient's medication shipment from the [facility] [phone]).      Confirmed the medication and dosage are correct and have not changed: Yes, regimen is correct and unchanged.    Confirmed patient started or stopped the following medications in the past month:  No, there are no changes reported at this time.    Are you tolerating your medication?:  David Ellis reports tolerating the medication.    ADHERENCE    (Below is required for Medicare Part B or Transplant patients only - per drug):   Prograf 1 mg   Quantity filled last month: 180   # of tablets left on hand: 84      Myfortic 180 mg   Quantity filled last month: 180   # of tablets left on hand: 84      Did you miss any doses in the past 4 weeks? No missed doses reported.    FINANCIAL/SHIPPING    Delivery Scheduled: Yes, Expected medication delivery date: Thurs, April 18     The patient will receive an FSI print out for each medication shipped and additional FDA Medication Guides as required.  Patient education from [facility] or Robet Leu may also be included in the shipment    David Ellis did not have any additional questions at this time.    Delivery address validated in FSI scheduling system: Yes, address listed in FSI is correct.    We will follow up with patient monthly for standard refill processing and delivery.      Thank you,  Tawanna Solo [facility] Specialty Pharmacist

## 2017-09-12 NOTE — Unmapped (Signed)
Patient would like advice on recent INR of 3.3 (his goal is 2.0-3.0).  Thank you.

## 2017-09-13 NOTE — Unmapped (Signed)
Patient states that he is taking 5 mg coumadin daily, has not taken extra doses or missed any doses.    Patient states that he has been doxycycline and that this is the last day of treatment.  Please advise thanks.

## 2017-09-16 NOTE — Unmapped (Signed)
Patient notified and voiced understanding to hold tonight's dose, then to resume 5 mg coumadin daily.  One week coumadin clinic scheduled.    Anticoagulation tracker updated.

## 2017-09-18 MED FILL — MYFORTIC/180MG/TAB: MYFORTIC/180MG/TAB | 30 days supply | Qty: 180 | Fill #9

## 2017-09-18 MED FILL — PROGRAF/1MG/CAP: PROGRAF/1MG/CAP | 30 days supply | Qty: 180 | Fill #9

## 2017-09-25 ENCOUNTER — Ambulatory Visit: Admit: 2017-09-25 | Discharge: 2017-09-26 | Payer: MEDICARE

## 2017-09-25 ENCOUNTER — Institutional Professional Consult (permissible substitution): Admit: 2017-09-25 | Discharge: 2017-09-26 | Payer: MEDICARE

## 2017-09-25 DIAGNOSIS — I4891 Unspecified atrial fibrillation: Principal | ICD-10-CM

## 2017-09-25 DIAGNOSIS — E1129 Type 2 diabetes mellitus with other diabetic kidney complication: Principal | ICD-10-CM

## 2017-09-25 NOTE — Unmapped (Signed)
Patient came for [place] meter to upload. Freestyle libre uploaded to [employer]. Patient also given  instruction to download at home for future reference and can let us know through [place] or call that he uploaded it. Patient receptive.

## 2017-09-25 NOTE — Unmapped (Signed)
Patient came in today for POC INR visit which read 3.8. Patient is currently taking 5 mg daily for a weekly total of 35 mg. Denies any missed doses, diet changes, bleeding, or new medications. Patient states that he may have taken a second dose one day in the past two weeks.    Last INR was 3.3 on 09/10/17. At that time we advised the patient hold their dose for one day and then resume 5 mg daily.    Patient advised to decrease dose to 30 mg weekly and recheck INR in 1 week per Dr. Dellis Filbert.

## 2017-09-26 MED ORDER — CHLORTHALIDONE 25 MG TABLET
ORAL_TABLET | 0 refills | 0 days | Status: CP
Start: 2017-09-26 — End: 2017-11-25

## 2017-09-26 MED ORDER — FUROSEMIDE 40 MG TABLET
ORAL_TABLET | 3 refills | 0 days | Status: CP
Start: 2017-09-26 — End: 2018-07-25

## 2017-09-26 MED ORDER — IRBESARTAN 150 MG TABLET
ORAL_TABLET | 3 refills | 0 days | Status: CP
Start: 2017-09-26 — End: 2018-09-25

## 2017-09-26 NOTE — Unmapped (Signed)
Confirmed.  [name], M.D.     [facility] at [facility]   [address]  [address]  [place], [place]  [address]  [phone]

## 2017-09-26 NOTE — Unmapped (Signed)
Lauren, please see refill requests for lasix and chlorthalidone. 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

## 2017-09-30 ENCOUNTER — Ambulatory Visit: Admit: 2017-09-30 | Discharge: 2017-09-30 | Payer: MEDICARE

## 2017-09-30 DIAGNOSIS — Z0000 Encounter for general adult medical examination without abnormal findings: Secondary | ICD-10-CM

## 2017-09-30 DIAGNOSIS — Z940 Kidney transplant status: Secondary | ICD-10-CM

## 2017-09-30 DIAGNOSIS — E782 Mixed hyperlipidemia: Secondary | ICD-10-CM

## 2017-09-30 DIAGNOSIS — I4891 Unspecified atrial fibrillation: Secondary | ICD-10-CM

## 2017-09-30 DIAGNOSIS — I2699 Other pulmonary embolism without acute cor pulmonale: Secondary | ICD-10-CM

## 2017-09-30 DIAGNOSIS — E1165 Type 2 diabetes mellitus with hyperglycemia: Secondary | ICD-10-CM

## 2017-09-30 DIAGNOSIS — E1121 Type 2 diabetes mellitus with diabetic nephropathy: Principal | ICD-10-CM

## 2017-09-30 DIAGNOSIS — I159 Secondary hypertension, unspecified: Secondary | ICD-10-CM

## 2017-09-30 DIAGNOSIS — E1129 Type 2 diabetes mellitus with other diabetic kidney complication: Principal | ICD-10-CM

## 2017-09-30 LAB — URINALYSIS WITH CULTURE REFLEX
BACTERIA: NONE SEEN /HPF
BILIRUBIN UA: NEGATIVE
BLOOD UA: NEGATIVE
GLUCOSE UA: NEGATIVE
HYALINE CASTS: 6 /LPF — ABNORMAL HIGH (ref 0–1)
KETONES UA: NEGATIVE
LEUKOCYTE ESTERASE UA: NEGATIVE
NITRITE UA: NEGATIVE
PH UA: 5.5 (ref 5.0–9.0)
PROTEIN UA: NEGATIVE
RBC UA: <1 /HPF (ref <=3)
SQUAMOUS EPITHELIAL: <1 /HPF (ref 0–5)
UROBILINOGEN UA: 0.2
WBC UA: 1 /HPF (ref <=2)

## 2017-09-30 LAB — LARGE UNSTAINED CELLS: Lab: 1

## 2017-09-30 LAB — COMPREHENSIVE METABOLIC PANEL
ALBUMIN: 3.8 g/dL (ref 3.5–5.0)
ALKALINE PHOSPHATASE: 81 U/L (ref 38–126)
ALT (SGPT): 42 U/L (ref 19–72)
ANION GAP: 12 mmol/L (ref 9–15)
AST (SGOT): 30 U/L (ref 19–55)
BILIRUBIN TOTAL: 1.1 mg/dL (ref 0.0–1.2)
BLOOD UREA NITROGEN: 41 mg/dL — ABNORMAL HIGH (ref 7–21)
BUN / CREAT RATIO: 29
CALCIUM: 9.2 mg/dL (ref 8.5–10.2)
CHLORIDE: 103 mmol/L (ref 98–107)
CO2: 24 mmol/L (ref 22.0–30.0)
EGFR MDRD AF AMER: >=60 mL/min/{1.73_m2} (ref >=60)
EGFR MDRD NON AF AMER: 50 mL/min/{1.73_m2} — ABNORMAL LOW (ref >=60)
GLUCOSE RANDOM: 239 mg/dL — ABNORMAL HIGH (ref 65–179)
POTASSIUM: 5.4 mmol/L — ABNORMAL HIGH (ref 3.5–5.0)
PROTEIN TOTAL: 6.8 g/dL (ref 6.5–8.3)
SODIUM: 139 mmol/L (ref 135–145)

## 2017-09-30 LAB — LIPID PANEL
CHOLESTEROL/HDL RATIO SCREEN: 4.4 (ref <5.0)
CHOLESTEROL: 135 mg/dL (ref 100–199)
LDL CHOLESTEROL CALCULATED: 55 mg/dL — ABNORMAL LOW (ref 60–99)
NON-HDL CHOLESTEROL: 104 mg/dL
VLDL CHOLESTEROL CAL: 48.6 mg/dL — ABNORMAL HIGH (ref 12–42)

## 2017-09-30 LAB — CBC W/ AUTO DIFF
BASOPHILS ABSOLUTE COUNT: 0 10*9/L (ref 0.0–0.1)
BASOPHILS RELATIVE PERCENT: 0.4 %
EOSINOPHILS ABSOLUTE COUNT: 0.1 10*9/L (ref 0.0–0.4)
EOSINOPHILS RELATIVE PERCENT: 1 %
HEMATOCRIT: 46.4 % (ref 41.0–53.0)
HEMOGLOBIN: 15.4 g/dL (ref 13.5–17.5)
LARGE UNSTAINED CELLS: 1 % (ref 0–4)
LYMPHOCYTES ABSOLUTE COUNT: 1.1 10*9/L — ABNORMAL LOW (ref 1.5–5.0)
LYMPHOCYTES RELATIVE PERCENT: 13.1 %
MEAN CORPUSCULAR HEMOGLOBIN: 28.5 pg (ref 26.0–34.0)
MEAN CORPUSCULAR VOLUME: 85.8 fL (ref 80.0–100.0)
MEAN PLATELET VOLUME: 8.3 fL (ref 7.0–10.0)
MONOCYTES ABSOLUTE COUNT: 0.5 10*9/L (ref 0.2–0.8)
MONOCYTES RELATIVE PERCENT: 5.8 %
NEUTROPHILS ABSOLUTE COUNT: 6.3 10*9/L (ref 2.0–7.5)
NEUTROPHILS RELATIVE PERCENT: 78.6 %
RED CELL DISTRIBUTION WIDTH: 14 % (ref 12.0–15.0)
WBC ADJUSTED: 8 10*9/L (ref 4.5–11.0)

## 2017-09-30 LAB — BLOOD UA: Lab: NEGATIVE

## 2017-09-30 LAB — HEMOGLOBIN A1C
ESTIMATED AVERAGE GLUCOSE: 214 mg/dL
Hemoglobin A1c/Hemoglobin.total:MFr:Pt:Bld:Qn:: 9.1 — ABNORMAL HIGH

## 2017-09-30 LAB — TACROLIMUS BLOOD: Lab: 5.9

## 2017-09-30 LAB — INR: Lab: 2.22

## 2017-09-30 LAB — LDL CHOLESTEROL CALCULATED: Cholesterol.in LDL:MCnc:Pt:Ser/Plas:Qn:Calculated: 55 — ABNORMAL LOW

## 2017-09-30 LAB — ALT (SGPT): Alanine aminotransferase:CCnc:Pt:Ser/Plas:Qn:: 42

## 2017-09-30 NOTE — Unmapped (Signed)
Check today; will forward to endo.

## 2017-09-30 NOTE — Unmapped (Signed)
[facility] labs today.  Back in three months.  Call if problems.

## 2017-09-30 NOTE — Unmapped (Signed)
Well controle.  Continued meds.  Lab sdoya.

## 2017-09-30 NOTE — Unmapped (Signed)
Continue AC>  Discussed today about changing to NOAC, which he declines.    PT INR today.

## 2017-09-30 NOTE — Unmapped (Signed)
Patient ID: David Ellis is a 72 y.o. male who presents for eval of medical issues.        Assessment/Plan:        Type II diabetes mellitus with renal manifestations, uncontrolled (CMS-HCC)  Check today; will forward to endo.    Pulmonary embolism (CMS-HCC)  Continue AC>  Discussed today about changing to NOAC, which he declines.    PT INR today.    Routine general medical examination at a health care facility  Reports negative cologuard early 2019.      Hypertension  Well controle.  Continued meds.  Lab sdoya.      Atrial fibrillation (CMS-HCC)  None recently.  Continue AC.      RTC 3 months or prn.    Orders Placed This Encounter   Procedures   ??? CBC w/ Differential   ??? Hemoglobin A1c   ??? Comprehensive Metabolic Panel   ??? Lipid Panel   ??? Urinalysis with Culture Reflex   ??? PT-INR   ??? Tacrolimus Level, Timed ([place])       -- Discussed the new prescription noted above, including potential side effects, drug interactions, instructions for taking the medication, and the consequences of not taking it.  -- Patient verbalized an understanding of today's assessment and recommendations, as well as the purpose of ongoing medications.    Subjective:     Current Health Status  Patient Active Problem List    Diagnosis Date Noted   ??? Routine general medical examination at a health care facility 11/17/2013     Priority: High   ??? Atrial fibrillation (CMS-HCC) 07/06/2013     Priority: High   ??? Right ventricular dysfunction 06/30/2013     Priority: High   ??? Pulmonary embolism (CMS-HCC) 05/23/2013     Priority: High   ??? Transplanted kidney      Priority: High   ??? Mixed hyperlipidemia 10/30/2011     Priority: High   ??? Type II diabetes mellitus with renal manifestations, uncontrolled (CMS-HCC) 03/26/2011     Priority: High   ??? Hypertension 10/10/2010     Priority: High   ??? PAD (peripheral artery disease) (CMS-HCC) 02/12/2017   ??? Hypoglycemia associated with type 2 diabetes mellitus (CMS-HCC) 09/03/2016   ??? BCC (basal cell carcinoma), face 07/18/2015   ??? Left knee pain 11/09/2014   ??? On statin therapy due to risk of future cardiovascular event 10/04/2014   ??? Toe amputation status (CMS-HCC) 08/10/2014   ??? Acquired absence of other toe(s), unspecified side (CMS-HCC) 08/10/2014   ??? Diabetic polyneuropathy associated with type 2 diabetes mellitus (CMS-HCC) 03/15/2014   ??? Aftercare following organ transplant 08/27/2013   ??? Edema 06/30/2013   ??? 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 History of kidney transplant 10/30/2011   ??? 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 Diverticulosis of colon 03/09/2011   ??? History of colonic polyps 03/09/2011      Here today for eval of medical issues.    He reports that generally he has been doing well.  Tolerating coumadin without bleeding.    REports bp is doing well at home.    Avoiding walking too much to avoid foot ulcers; reports that his feet are fine.  Awaiting vascular studies via wound clinic.  REmains on coumadin without bleeding.  The patient is without complaints of chest pain, sob, fever, chills, nausea, vomiting, diarrhea, constipation, BRBPR, melena, abdominal pains, weight changes, confusion,  headaches.        Allergies   Allergen Reactions   ??? Levofloxacin Other (See Comments)     Other reaction(s): Unknown  Unknown   ??? Lidocaine Other (See Comments)     I don't remember States is fine with Bupivicaine   ??? Penicillins      As child  Other reaction(s): UNKNOWN.   (UPDATE: Tolerated amp/sulbactam without side effects during 10/2014 hospitalization)   ??? Egg Derived Rash   ??? Enalapril Cough   ??? Epinephrine Palpitations   ??? Grass Pollen-[place], Standard Itching   ??? Lisinopril Rash   ??? Mepivacaine Hcl Palpitations       Current Outpatient Medications   Medication Sig Dispense Refill   ??? aspirin (ECOTRIN) 81 MG tablet Take 81 mg by mouth continuous as needed. Only when flying and/or traveling by car more than 2 hours.     ??? BD INSULIN SYRINGE ULTRA-FINE 0.3 mL 31 gauge x 15/64 Syrg 1 each by Other route Three (3) times a day. 300 Syringe 3   ??? BD ULTRA-FINE NANO PEN NEEDLE 32 gauge x 5/32 Ndle USE UP TO 3 TIMES DAILY 100 each 11   ??? blood sugar diagnostic (FREESTYLE PRECISION NEO STRIPS) Strp Use to test blood sugar 4 times daily. Dx E11.65. 150 each 5   ??? chlorthalidone (HYGROTON) 25 MG tablet TAKE ONE TABLET BY MOUTH EVERY DAY 60 tablet 0   ??? cholecalciferol, vitamin D3, (VITAMIN D3) 2,000 unit Tab Take 2,000 Units by mouth daily.      ??? furosemide (LASIX) 40 MG tablet TAKE 1 TO 2 TABLETS ONCE A DAY 180 tablet 3   ??? gabapentin (NEURONTIN) 300 MG capsule TAKE 1 CAPSULE TWICE DAILY 180 capsule 5   ??? HUMULIN R U-500, CONC, KWIKPEN 500 unit/mL (3 mL) CONCENTRATED injection DIAL TO MAX 100 UNITS BEFORE [facility] AND DINNER.INJECT 30 MINS BEFORE MEALS.MAX OF 300 UNITS DAILY 18 mL 11   ??? hydrocortisone 2.5 % cream   0   ??? irbesartan (AVAPRO) 150 MG tablet TAKE ONE TABLET AT BEDTIME 90 tablet 3   ??? ketoconazole (NIZORAL) 2 % cream   0   ??? lancets Misc 1 each by Miscellaneous route Four (4) times a day. 150 each 6   ??? magnesium chloride (SLOW_MAG) 64 mg TbEC Take 128 mg by mouth three (3) times a day (at 6am, noon and 6pm).      ??? metoprolol succinate (TOPROL-XL) 200 MG 24 hr tablet TAKE ONE TABLET BY MOUTH TWICE DAILY 180 tablet 3   ??? multivitamin capsule Take 1 capsule by mouth daily.     ??? mycophenolate (MYFORTIC) 180 MG EC tablet Take 3 tablets (540 mg total) by mouth Two (2) times a day. 540 tablet 3   ??? pravastatin (PRAVACHOL) 20 MG tablet Take 1 tablet (20 mg total) by mouth daily. 90 tablet 3   ??? silver sulfaDIAZINE (SILVADENE) 1 % cream Apply topically daily. 50 g 0   ??? tacrolimus (PROGRAF) 1 MG capsule Take 3 capsules (3 mg total) by mouth Two (2) times a day. Z94.0 540 capsule 3   ??? warfarin (COUMADIN) 5 MG tablet Take 5 mg every day except mondays (Patient taking differently: 5 mg. Take 5 mg every day except mondays) 30 tablet 6   ??? blood-glucose meter kit Use as instructed. 1 each 0   ??? glucagon, human recombinant, (GLUCAGON) 1 mg injection Follow package directions for low blood sugar. 1 mg 1     No  current facility-administered medications for this visit.      Facility-Administered Medications Ordered in Other Visits   Medication Dose Route Frequency Provider Last Rate Last Dose   ??? bevacizumab    PRN (once a day) Melvyn Novas, MD   2.5 mg at 01/28/17 0750       Past Medical History:   Diagnosis Date   ??? Atrial flutter (CMS-HCC)    ??? Diabetes mellitus (CMS-HCC)    ??? Diabetic nephropathy (CMS-HCC)    ??? Diabetic retinopathy (CMS-HCC)    ??? Fractures    ??? Ganglion cyst    ??? Hand injury    ??? Heart disease    ??? Hyperlipidemia    ??? Hypertension    ??? Joint pain    ??? Osteomyelitis (CMS-HCC) June 2016   ??? Pulmonary embolism (CMS-HCC) Dec 2015   ??? 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 Stroke (CMS-HCC)    ??? Tear of meniscus of knee    ??? 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 NEPHRECTOMY TRANSPLANTED ORGAN Right     LURD - spouse received in 2013.   ??? PR AMPUTATION METATARSAL+TOE,SINGLE Left 07/06/2014    Procedure: AMPUTATION, METATARSAL, WITH TOE SINGLE;  Surgeon: Marion Downer, MD;  Location: MAIN OR [facility];  Service: Vascular   ??? PR AMPUTATION METATARSAL+TOE,SINGLE Left 10/28/2014    Procedure: AMPUTATION, METATARSAL, WITH TOE SINGLE;  Surgeon: Maple Mirza, MD;  Location: MAIN OR [facility];  Service: Vascular   ??? PR VITRECTOMY,PANRETINAL LASER RX Right 11/26/2016    Procedure: VITRECTOMY, MECHANICAL, PARS PLANA APPROACH; WITH ENDOLASER PANRETINAL PHOTOCOAGULATION;  Surgeon: Melvyn Novas, MD;  Location[facility] OR [facility];  Service: Ophthalmology   ??? PR VITRECTOMY,PANRETINAL LASER RX Left 01/28/2017    Procedure: VITRECTOMY, MECHANICAL, PARS PLANA APPROACH; WITH ENDOLASER PANRETINAL PHOTOCOAGULATION;  Surgeon: Melvyn Novas, MD;  Location[facility] OR [facility];  Service: Ophthalmology   ??? SKIN BIOPSY         Family History   Problem Relation Age of Onset   ??? Kidney disease Mother    ??? Diabetes Mother    ??? Kidney disease Father    ??? Kidney disease Maternal Grandmother    ??? Diabetes Maternal Grandmother        Social History     Tobacco Use   ??? Smoking status: Never Smoker   ??? Smokeless tobacco: Never Used   Substance Use Topics   ??? Alcohol use: No   ??? Drug use: No             Objective:       Vital Signs  BP 118/70 (BP Site: L Arm, BP Position: Sitting)  - Pulse 59  - Ht 185.4 cm (6' 1)  - Wt (!) 122.5 kg (270 lb)  - SpO2 97%  - BMI 35.62 kg/m??      Exam  Physical Exam:    Well developed, well nourished male in no acute distress.      Vitals:    09/30/17 0845   BP: 118/70   Pulse: 59   SpO2: 97%     LUNGS:  CTA  CARDIOVASCULAR:  RRR without murmurs, or gallops, or rubs.  Pulses 2+ bilaterally and symmetric.    ABDOMEN:  NABS/ NT/ND/soft.  NO hsm nor abdominal masses.    EXTREMITIES:  No c/c/e.  No ulcers or abrasions.

## 2017-09-30 NOTE — Unmapped (Signed)
Reports negative cologuard early 2019.

## 2017-09-30 NOTE — Unmapped (Signed)
None recently.  Continue AC.

## 2017-10-03 NOTE — Unmapped (Signed)
-----   Message from Sharlee Blew, MD sent at 10/03/2017  7:50 AM EDT -----  Call and let patient know labs stable except sugars up to 9.1.  INR Ok.  Same dose; repeat INR one month.    [name], M.D.     [facility] Internal Medicine at [facility]   [address]  [address]  [place], [place]  [address]  [phone]

## 2017-10-03 NOTE — Unmapped (Signed)
Pt notified of lab results. No further questions or concerns

## 2017-10-09 NOTE — Unmapped (Signed)
[facility] Refill and Clinical Coordination Note  Medication(s): PROGRAF, MYFORTIC    David Ellis, DOB: 04/26/1946  Phone: [phone] (home) [phone] (work), Alternate phone contact: N/A  Shipping address: [name]  [place] [address]  Phone or address changes today?: No  All above HIPAA information verified.  Insurance changes? No    Completed refill and clinical call assessment today to schedule patient's medication shipment from the [facility] [phone]).      MEDICATION RECONCILIATION    Confirmed the medication and dosage are correct and have not changed: Yes, regimen is correct and unchanged.    Were there any changes to your medication(s) in the past month:  No, there are no changes reported at this time.    ADHERENCE    Is this medicine transplant or covered by Medicare Part B? Yes.    Prograf 1 mg   Quantity filled last month: 180   # of tablets left on hand: 10 DAYS LEFT      Myfortic 180 mg   Quantity filled last month: 180   # of tablets left on hand: 10 DAYS LEFT      Did you miss any doses in the past 4 weeks? No missed doses reported.  Adherence counseling provided? Not needed     SIDE EFFECT MANAGEMENT    Are you tolerating your medication?:  David Ellis reports tolerating the medication.  Side effect management discussed: None      Therapy is appropriate and should be continued.    Evidence of clinical benefit: See Epic note from 08/27/17      FINANCIAL/SHIPPING    Delivery Scheduled: Yes, Expected medication delivery date: 10/14/17   Additional medications refilled: No additional medications/refills needed at this time.    The patient will receive an FSI print out for each medication shipped and additional FDA Medication Guides as required.  Patient education from [place] or Robet Leu may also be included in the shipment.    David Ellis did not have any additional questions at this time.    Delivery address validated in FSI scheduling system: Yes, address listed above is correct.      We will follow up with patient monthly for standard refill processing and delivery.      Thank you,  [name]   [facility] Specialty Pharmacist

## 2017-10-11 MED FILL — PROGRAF/1MG/CAP: PROGRAF/1MG/CAP | 30 days supply | Qty: 180 | Fill #10

## 2017-10-11 MED FILL — MYFORTIC/180MG/TAB: MYFORTIC/180MG/TAB | 30 days supply | Qty: 180 | Fill #10

## 2017-10-25 MED ORDER — BD ULTRA-FINE NANO PEN NEEDLE 32 GAUGE X 5/32" (4 MM)
3 refills | 0 days | Status: CP
Start: 2017-10-25 — End: 2018-11-17

## 2017-10-25 MED ORDER — HUMULIN R U-500 (CONC) INSULIN KWIKPEN 500 UNIT/ML (3 ML) SUBCUTANEOUS
11 refills | 0 days | Status: CP
Start: 2017-10-25 — End: 2018-12-03

## 2017-10-29 ENCOUNTER — Ambulatory Visit: Admit: 2017-10-29 | Discharge: 2017-10-30 | Payer: MEDICARE

## 2017-10-29 DIAGNOSIS — Z940 Kidney transplant status: Secondary | ICD-10-CM

## 2017-10-29 DIAGNOSIS — E1165 Type 2 diabetes mellitus with hyperglycemia: Secondary | ICD-10-CM

## 2017-10-29 DIAGNOSIS — I2699 Other pulmonary embolism without acute cor pulmonale: Secondary | ICD-10-CM

## 2017-10-29 DIAGNOSIS — E1129 Type 2 diabetes mellitus with other diabetic kidney complication: Principal | ICD-10-CM

## 2017-10-29 LAB — HEMOGLOBIN A1C: ESTIMATED AVERAGE GLUCOSE: 209 mg/dL

## 2017-10-29 LAB — CBC W/ AUTO DIFF
BASOPHILS ABSOLUTE COUNT: 0 10*9/L (ref 0.0–0.1)
BASOPHILS RELATIVE PERCENT: 0.4 %
EOSINOPHILS ABSOLUTE COUNT: 0.1 10*9/L (ref 0.0–0.4)
EOSINOPHILS RELATIVE PERCENT: 1.6 %
HEMATOCRIT: 46.4 % (ref 41.0–53.0)
HEMOGLOBIN: 15 g/dL (ref 13.5–17.5)
LYMPHOCYTES ABSOLUTE COUNT: 1.3 10*9/L — ABNORMAL LOW (ref 1.5–5.0)
LYMPHOCYTES RELATIVE PERCENT: 19.4 %
MEAN CORPUSCULAR HEMOGLOBIN CONC: 32.3 g/dL (ref 31.0–37.0)
MEAN CORPUSCULAR HEMOGLOBIN: 28.2 pg (ref 26.0–34.0)
MEAN CORPUSCULAR VOLUME: 87.1 fL (ref 80.0–100.0)
MEAN PLATELET VOLUME: 8.3 fL (ref 7.0–10.0)
MONOCYTES ABSOLUTE COUNT: 0.5 10*9/L (ref 0.2–0.8)
MONOCYTES RELATIVE PERCENT: 6.8 %
NEUTROPHILS ABSOLUTE COUNT: 4.7 10*9/L (ref 2.0–7.5)
NEUTROPHILS RELATIVE PERCENT: 69.4 %
PLATELET COUNT: 205 10*9/L (ref 150–440)
RED BLOOD CELL COUNT: 5.33 10*12/L (ref 4.50–5.90)

## 2017-10-29 LAB — SPECIFIC GRAVITY UA: Lab: 1.014

## 2017-10-29 LAB — COMPREHENSIVE METABOLIC PANEL
ALKALINE PHOSPHATASE: 71 U/L (ref 38–126)
ALT (SGPT): 34 U/L (ref 19–72)
ANION GAP: 11 mmol/L (ref 9–15)
AST (SGOT): 27 U/L (ref 19–55)
BILIRUBIN TOTAL: 0.8 mg/dL (ref 0.0–1.2)
BLOOD UREA NITROGEN: 49 mg/dL — ABNORMAL HIGH (ref 7–21)
BUN / CREAT RATIO: 30
CALCIUM: 9.1 mg/dL (ref 8.5–10.2)
CHLORIDE: 99 mmol/L (ref 98–107)
CO2: 29 mmol/L (ref 22.0–30.0)
CREATININE: 1.63 mg/dL — ABNORMAL HIGH (ref 0.70–1.30)
EGFR MDRD AF AMER: 51 mL/min/{1.73_m2} — ABNORMAL LOW (ref >=60)
EGFR MDRD NON AF AMER: 42 mL/min/{1.73_m2} — ABNORMAL LOW (ref >=60)
GLUCOSE RANDOM: 308 mg/dL — ABNORMAL HIGH (ref 65–179)
POTASSIUM: 5.2 mmol/L — ABNORMAL HIGH (ref 3.5–5.0)
PROTEIN TOTAL: 6.6 g/dL (ref 6.5–8.3)
SODIUM: 139 mmol/L (ref 135–145)

## 2017-10-29 LAB — TACROLIMUS BLOOD: Lab: 6.1

## 2017-10-29 LAB — URINALYSIS WITH CULTURE REFLEX
BACTERIA: NONE SEEN /HPF
BILIRUBIN UA: NEGATIVE
LEUKOCYTE ESTERASE UA: NEGATIVE
NITRITE UA: NEGATIVE
PH UA: 5.5 (ref 5.0–9.0)
PROTEIN UA: NEGATIVE
RBC UA: <1 /HPF (ref <=3)
SPECIFIC GRAVITY UA: 1.014 (ref 1.003–1.030)
SQUAMOUS EPITHELIAL: <1 /HPF (ref 0–5)
UROBILINOGEN UA: 0.2
WBC UA: 1 /HPF (ref <=2)

## 2017-10-29 LAB — LIPID PANEL
CHOLESTEROL/HDL RATIO SCREEN: 4.3 (ref <5.0)
LDL CHOLESTEROL CALCULATED: 47 mg/dL — ABNORMAL LOW (ref 60–99)
NON-HDL CHOLESTEROL: 97 mg/dL
TRIGLYCERIDES: 251 mg/dL — ABNORMAL HIGH (ref 1–149)
VLDL CHOLESTEROL CAL: 50.2 mg/dL — ABNORMAL HIGH (ref 12–42)

## 2017-10-29 LAB — PTH: PARATHYROID HORMONE INTACT: 157.3 pg/mL — ABNORMAL HIGH (ref 12.0–72.0)

## 2017-10-29 LAB — CO2: Carbon dioxide:SCnc:Pt:Ser/Plas:Qn:: 29

## 2017-10-29 LAB — NEUTROPHILS RELATIVE PERCENT: Lab: 69.4

## 2017-10-29 LAB — PHOSPHORUS: Phosphate:MCnc:Pt:Ser/Plas:Qn:: 4.3

## 2017-10-29 LAB — ESTIMATED AVERAGE GLUCOSE: Estimated average glucose:MCnc:Pt:Bld:Qn:Estimated from glycated hemoglobin: 209

## 2017-10-29 LAB — NON-HDL CHOLESTEROL: Cholesterol.non HDL:MCnc:Pt:Ser/Plas:Qn:: 97

## 2017-10-29 LAB — PARATHYROID HORMONE INTACT: Parathyrin.intact:MCnc:Pt:Ser/Plas:Qn:: 157.3 — ABNORMAL HIGH

## 2017-10-29 LAB — INR: Lab: 2.31

## 2017-10-29 NOTE — Unmapped (Signed)
-----   Message from Sharlee Blew, MD sent at 10/29/2017  3:10 PM EDT -----  Call ad letp pt know labs are sable except    1) sugar remains high; follow up with Endocrinology for this.  2) Creatinine bumpbed a bit.  Repeat BMP In a month.  3) INR is ok.  Same dose on coumadin re;eat in a month.    [name], M.D.     [facility] Internal Medicine at [facility]   [address]  [address]  [place], [place]  [address]  [phone]

## 2017-10-29 NOTE — Unmapped (Signed)
Left message for patient to call back for results.  

## 2017-10-30 NOTE — Unmapped (Signed)
Message relayed to patient. No questions at this time. Per patient, he gets labs checked monthly anyway. Updated Anticoag tracker.

## 2017-11-05 NOTE — Unmapped (Signed)
Patient does not need a refill of specialty medication at this time. Moving specialty refill reminder call to appropriate date and removed call attempt data. Spoke with patient and he states he has enough medication (Prograf & myfortic) till the 24th of June. And does not need any refills at this time. Will reach out to him on June 17th to schedule out refills.

## 2017-11-12 NOTE — Unmapped (Signed)
[facility] Refill Coordination Note    Specialty Medication(s) to be Shipped:   Transplant: Myfortic 180mg  and Prograf 1mg      David Ellis, DOB: [birth year]  Phone: [phone] (home) [phone] (work)  Shipping Address: [address]  [place] [address]    All above HIPAA information was verified with patient.     Completed refill call assessment today to schedule patient's medication shipment from the [facility] [phone]).       Specialty medication(s) and dose(s) confirmed: Regimen is correct and unchanged.   Changes to medications: David Ellis reports no changes reported at this time.  Changes to insurance: No  Questions for the pharmacist: No    The patient will receive an FSI print out for each medication shipped and additional FDA Medication Guides as required.  Patient education from David Ellis or David Ellis may also be included in the shipment.    DISEASE-SPECIFIC INFORMATION        N/A    ADHERENCE     Medication Adherence    Patient reported X missed doses in the last month:  0         MEDICARE PART B DOCUMENTATION     Myfortic 180mg : Patient has 6 days worth of tablets on hand.  Prograf 1mg : Patient has 6 days worth of capsules on hand.    SHIPPING     Shipping address confirmed in FSI.     Delivery Scheduled: Yes, Expected medication delivery date: 11/14/2017 via UPS or courier.     David Ellis   [facility] Specialty Technician

## 2017-11-13 MED FILL — PROGRAF/1MG/CAP: PROGRAF/1MG/CAP | 30 days supply | Qty: 180 | Fill #11

## 2017-11-13 MED FILL — MYFORTIC/180MG/TAB: MYFORTIC/180MG/TAB | 30 days supply | Qty: 180 | Fill #11

## 2017-11-15 NOTE — Unmapped (Signed)
Trulicity 0.75mg  Prior Auth approved. Effective from 11/15/2017 through 11/16/2018

## 2017-11-15 NOTE — Unmapped (Signed)
Trulicity 0.75mg  Prior Auth pending

## 2017-11-25 MED ORDER — WARFARIN 5 MG TABLET
ORAL_TABLET | 6 refills | 0 days | Status: CP
Start: 2017-11-25 — End: 2018-12-12

## 2017-11-25 MED ORDER — CHLORTHALIDONE 25 MG TABLET
ORAL_TABLET | 5 refills | 0 days | Status: CP
Start: 2017-11-25 — End: 2018-10-10

## 2017-11-25 NOTE — Unmapped (Signed)
Can you please refill?    Thanks,  Lenis Dickinson

## 2017-11-27 ENCOUNTER — Ambulatory Visit: Admit: 2017-11-27 | Discharge: 2017-11-28 | Payer: MEDICARE

## 2017-11-27 DIAGNOSIS — E1165 Type 2 diabetes mellitus with hyperglycemia: Secondary | ICD-10-CM

## 2017-11-27 DIAGNOSIS — I2699 Other pulmonary embolism without acute cor pulmonale: Secondary | ICD-10-CM

## 2017-11-27 DIAGNOSIS — Z940 Kidney transplant status: Secondary | ICD-10-CM

## 2017-11-27 DIAGNOSIS — E1129 Type 2 diabetes mellitus with other diabetic kidney complication: Principal | ICD-10-CM

## 2017-11-27 LAB — LIPID PANEL
CHOLESTEROL/HDL RATIO SCREEN: 4.1 (ref <5.0)
CHOLESTEROL: 114 mg/dL (ref 100–199)
LDL CHOLESTEROL CALCULATED: 36 mg/dL — ABNORMAL LOW (ref 60–99)
NON-HDL CHOLESTEROL: 86 mg/dL
TRIGLYCERIDES: 250 mg/dL — ABNORMAL HIGH (ref 1–149)

## 2017-11-27 LAB — COMPREHENSIVE METABOLIC PANEL
ALKALINE PHOSPHATASE: 78 U/L (ref 38–126)
ALT (SGPT): 32 U/L (ref 19–72)
ANION GAP: 10 mmol/L (ref 9–15)
AST (SGOT): 22 U/L (ref 19–55)
BILIRUBIN TOTAL: 0.8 mg/dL (ref 0.0–1.2)
BLOOD UREA NITROGEN: 47 mg/dL — ABNORMAL HIGH (ref 7–21)
BUN / CREAT RATIO: 29
CALCIUM: 8.9 mg/dL (ref 8.5–10.2)
CHLORIDE: 97 mmol/L — ABNORMAL LOW (ref 98–107)
CO2: 28 mmol/L (ref 22.0–30.0)
CREATININE: 1.62 mg/dL — ABNORMAL HIGH (ref 0.70–1.30)
EGFR CKD-EPI AA MALE: 49 mL/min/{1.73_m2} — ABNORMAL LOW (ref >=60)
EGFR CKD-EPI NON-AA MALE: 42 mL/min/{1.73_m2} — ABNORMAL LOW (ref >=60)
GLUCOSE RANDOM: 375 mg/dL — ABNORMAL HIGH (ref 65–179)
POTASSIUM: 4.4 mmol/L (ref 3.5–5.0)
PROTEIN TOTAL: 6.2 g/dL — ABNORMAL LOW (ref 6.5–8.3)
SODIUM: 135 mmol/L (ref 135–145)

## 2017-11-27 LAB — URINALYSIS WITH CULTURE REFLEX
BACTERIA: NONE SEEN /HPF
BILIRUBIN UA: NEGATIVE
BLOOD UA: NEGATIVE
GLUCOSE UA: >1000 — AB
HYALINE CASTS: 1 /LPF (ref 0–1)
LEUKOCYTE ESTERASE UA: NEGATIVE
PH UA: 5.5 (ref 5.0–9.0)
RBC UA: <1 /HPF (ref <=3)
SPECIFIC GRAVITY UA: 1.017 (ref 1.003–1.030)
SQUAMOUS EPITHELIAL: <1 /HPF (ref 0–5)
UROBILINOGEN UA: 0.2
WBC UA: 1 /HPF (ref <=2)

## 2017-11-27 LAB — BILIRUBIN UA: Lab: NEGATIVE

## 2017-11-27 LAB — CBC W/ AUTO DIFF
BASOPHILS RELATIVE PERCENT: 0.5 %
EOSINOPHILS RELATIVE PERCENT: 1.2 %
HEMATOCRIT: 44.4 % (ref 41.0–53.0)
HEMOGLOBIN: 14.6 g/dL (ref 13.5–17.5)
LARGE UNSTAINED CELLS: 2 % (ref 0–4)
LYMPHOCYTES RELATIVE PERCENT: 16.2 %
MEAN CORPUSCULAR HEMOGLOBIN CONC: 32.9 g/dL (ref 31.0–37.0)
MEAN CORPUSCULAR HEMOGLOBIN: 28.4 pg (ref 26.0–34.0)
MEAN CORPUSCULAR VOLUME: 86.2 fL (ref 80.0–100.0)
MEAN PLATELET VOLUME: 8 fL (ref 7.0–10.0)
MONOCYTES ABSOLUTE COUNT: 0.4 10*9/L (ref 0.2–0.8)
MONOCYTES RELATIVE PERCENT: 6.5 %
NEUTROPHILS ABSOLUTE COUNT: 4.9 10*9/L (ref 2.0–7.5)
NEUTROPHILS RELATIVE PERCENT: 74 %
PLATELET COUNT: 240 10*9/L (ref 150–440)
RED BLOOD CELL COUNT: 5.16 10*12/L (ref 4.50–5.90)
RED CELL DISTRIBUTION WIDTH: 14.1 % (ref 12.0–15.0)
WBC ADJUSTED: 6.7 10*9/L (ref 4.5–11.0)

## 2017-11-27 LAB — NEUTROPHILS RELATIVE PERCENT: Lab: 74

## 2017-11-27 LAB — HEMOGLOBIN A1C
HEMOGLOBIN A1C: 9.4 % — ABNORMAL HIGH (ref 4.8–5.6)
Hemoglobin A1c/Hemoglobin.total:MFr:Pt:Bld:Qn:: 9.4 — ABNORMAL HIGH

## 2017-11-27 LAB — CHOLESTEROL/HDL RATIO SCREEN: Lab: 4.1

## 2017-11-27 LAB — EGFR CKD-EPI NON-AA MALE: Lab: 42 — ABNORMAL LOW

## 2017-11-27 LAB — INR: Lab: 2.52

## 2017-11-27 LAB — PHOSPHORUS: Phosphate:MCnc:Pt:Ser/Plas:Qn:: 3.7

## 2017-11-27 LAB — PARATHYROID HORMONE INTACT: Parathyrin.intact:MCnc:Pt:Ser/Plas:Qn:: 192.3 — ABNORMAL HIGH

## 2017-11-27 LAB — TACROLIMUS BLOOD: Lab: 5.7

## 2017-12-02 NOTE — Unmapped (Signed)
-----   Message from Sharlee Blew, MD sent at 11/30/2017  1:55 PM EDT -----  David Ellis patient know labs stable except sugars are still high.  Please have him follow up with his endocrinologist about this.  INR is ok.  Same dose; repeat INR one month.    [name], M.D.     [facility] Internal Medicine at [facility]   [address]  [address]  [place], [place]  [address]  [phone]

## 2017-12-03 NOTE — Unmapped (Signed)
Patient has scheduled appt with endocrinologist and will follow up with INR in one month. No further questions at this time.

## 2017-12-04 NOTE — Unmapped (Signed)
[facility] Refill Coordination Note    Specialty Medication(s) to be Shipped:   Transplant: Myfortic 180mg  and Prograf 1mg     Other medication(s) to be shipped:      [name], DOB: 1946/02/17  Phone: [phone] (home) [phone] (work)  Shipping Address: [name]  [place] [address]    All above HIPAA information was verified with patient.     Completed refill call assessment today to schedule patient's medication shipment from the [facility] [phone]).       Specialty medication(s) and dose(s) confirmed: Regimen is correct and unchanged.   Changes to medications: Estel reports no changes reported at this time.  Changes to insurance: No  Questions for the pharmacist: No    The patient will receive an FSI print out for each medication shipped and additional FDA Medication Guides as required.  Patient education from [place] or Robet Leu may also be included in the shipment.    DISEASE-SPECIFIC INFORMATION        N/A    ADHERENCE     Medication Adherence    Patient reported X missed doses in the last month:  0  Specialty Medication:  PROGRAF 1 MG #OH-13 DAYS  Patient is on additional specialty medications:  Yes  Additional Specialty Medications:  MYFORTIC 180MG  #OH 13 DAYS  Patient Reported Additional Medication X Missed Doses in the Last Month:  0  Patient is on more than two specialty medications:  No  Demonstrates understanding of importance of adherence:  yes  Informant:  patient  Reliability of informant:  reliable  Patient is at risk for Non-Adherence:  No  Confirmed plan for next specialty medication refill:  delivery by pharmacy  Refills needed for supportive medications:  yes, ordered or provider notified          Refill Coordination    Has the Patients' Contact Information Changed:  No  Is the Shipping Address Different:  No         MEDICARE PART B DOCUMENTATION     Myfortic 180mg : Patient has 78 tablets on hand.  Prograf 1mg : Patient has 78 capsules on hand.    SHIPPING     Shipping address confirmed in FSI.     Delivery Scheduled: Yes, Expected medication delivery date: 12/11/17.  However, Rx request for refills was sent to the provider as there are none remaining.     Lajean Silvius   [facility] Specialty Technician

## 2017-12-06 MED ORDER — PROGRAF 1 MG CAPSULE
ORAL_CAPSULE | PRN refills | 0.00000 days | Status: CP
Start: 2017-12-06 — End: 2017-12-08

## 2017-12-06 MED ORDER — PROGRAF 1 MG CAPSULE: capsule | 99 refills | 0 days

## 2017-12-06 NOTE — Unmapped (Signed)
Pt request for RX Refill

## 2017-12-08 MED ORDER — MYCOPHENOLATE SODIUM 180 MG TABLET,DELAYED RELEASE
ORAL_TABLET | Freq: Two times a day (BID) | ORAL | 11 refills | 0.00000 days | Status: CP
Start: 2017-12-08 — End: 2017-12-08

## 2017-12-08 MED ORDER — MYFORTIC 180 MG TABLET,DELAYED RELEASE: tablet | 35 refills | 0 days | Status: AC

## 2017-12-08 MED ORDER — PROGRAF 1 MG CAPSULE: capsule | 11 refills | 0 days | Status: AC

## 2017-12-08 MED ORDER — MYFORTIC 180 MG TABLET,DELAYED RELEASE
ORAL_TABLET | ORAL | 35 refills | 0.00000 days | Status: CP
Start: 2017-12-08 — End: 2018-12-12
  Filled 2018-01-30: qty 180, 30d supply, fill #0

## 2017-12-08 MED ORDER — TACROLIMUS 1 MG CAPSULE
ORAL_CAPSULE | Freq: Two times a day (BID) | ORAL | 11 refills | 0 days | Status: CP
Start: 2017-12-08 — End: 2017-12-08

## 2017-12-08 MED ORDER — PROGRAF 1 MG CAPSULE
ORAL_CAPSULE | ORAL | 11 refills | 0.00000 days | Status: CP
Start: 2017-12-08 — End: 2018-10-20
  Filled 2018-01-30: qty 180, 30d supply, fill #0

## 2017-12-10 ENCOUNTER — Ambulatory Visit: Admit: 2017-12-10 | Discharge: 2017-12-10 | Payer: MEDICARE

## 2017-12-10 ENCOUNTER — Ambulatory Visit: Admit: 2017-12-10 | Discharge: 2017-12-10 | Payer: MEDICARE | Attending: Vascular Surgery | Primary: Vascular Surgery

## 2017-12-10 DIAGNOSIS — I739 Peripheral vascular disease, unspecified: Principal | ICD-10-CM

## 2017-12-10 DIAGNOSIS — L97909 Non-pressure chronic ulcer of unspecified part of unspecified lower leg with unspecified severity: Secondary | ICD-10-CM

## 2017-12-10 DIAGNOSIS — E11622 Type 2 diabetes mellitus with other skin ulcer: Secondary | ICD-10-CM

## 2017-12-10 DIAGNOSIS — L03115 Cellulitis of right lower limb: Secondary | ICD-10-CM

## 2017-12-10 MED ORDER — DOXYCYCLINE HYCLATE 100 MG TABLET
ORAL_TABLET | Freq: Two times a day (BID) | ORAL | 0 refills | 0 days | Status: CP
Start: 2017-12-10 — End: 2017-12-20

## 2017-12-10 MED FILL — MYFORTIC/180MG/TAB: MYFORTIC/180MG/TAB | 30 days supply | Qty: 180 | Fill #0

## 2017-12-10 MED FILL — PROGRAF/1MG/CAP: PROGRAF/1MG/CAP | 30 days supply | Qty: 180 | Fill #0

## 2017-12-10 NOTE — Unmapped (Addendum)
Wound Care Instructions:    First, cleanse hands with antibacterial soap and water or hand sanitizer solution. Then, begin wound care.     Cleanse wound with TRIPLE antiseptic rinse shown below:    1.) First, mix one tsp hibiclens mixed with 2 cups distilled water and spray over wound then gently wipe clean.     2.) Second, use one cup white vinegar to three cups distilled water mixture applied to the wound bed and surrounding area using a gauze pad. Allow wound to soak for 5 minutes.     3.) Third, spray Dakins, Anasept, or Vashe cleanser to the wound bed and surrounding area using a gauze pad. Allow wound to soak for 5 minutes.         Finally, apply dressing explained below.        Wound Dressing instructions:  Apply silvadene to the wound bed and cover with gauze and roll gauze. May substitute gauze with bordered band aid.

## 2017-12-10 NOTE — Unmapped (Addendum)
PCP:  David Addison, MD      ASSESSMENT/PLAN:  [name] is a 72 y.o. patient s/p LURD kidney transplant 2013 with postoperative complications of a massive PE in 2014, osteomyelitis requiring 4th/5th toe amputations in 2016.     1. CKD stage 3: Renal function at his baseline and his electrolytes are WNL. Urine dipstick negative for proteinuria and most recent UACR was within normal range. Continue ARB at low dose.     2. S/P LURD kidney transplant 2013. Has had 3 prior biopsies all in 2013 revealing moderate to severe arteriosclerosis. The patient has a history of a DQ 7 donor specific antibody noted on 09/17/2011 with all subsequent HLA antibody screens revealing no DSA's. Continue current dose of mycophenolate and tacrolimus (goal trough of 4-7). Tac troughs have been within range 5.7-7.4 since March 2019. Had urine cytology this year which were negative for BK.    3. HTN. BP low today but was high the other day, so likely an aberration, particularly since he is asymptomatic and it's been running in the 130s/80s at home. Asked him to continue to monitor.    4. T2DM. Uncontrolled. Very insulin resistant and has had to be more sedentary over last year or two due to foot wound. Followed by endocrine. Goal HbA1c < 8%. Looking to see if the trulicity will be affordable.  Follows with Dr. Tiburcio Ellis in endocrinology. History of proliferative retinopathy s/p laser treatments, last evaluated by ophthalmology 06/2017.    5. CVD risk. Continue pravastatin.     6. Need for chronic anticoagulation. Completed treatment for PE but continues on coumadin for paroxysmal AFib. We'd discussed transition to eliquis but given lack of reversal agent, it was decided to continue the warfarin since he's been very stable on it.     7. HM: Had his flu shot and UTD on pneumonia vaccine. Receiving second shinrix today. Due for colonoscopy but reportedly GI is leary of stopping anticoagulation so are following cologuard test results. No FH colon CA. Continue biannual follow-up with dermatology re: recurrent basal cell CA.    8. Falls risk: Severe neuropathy with recent fall at home while not using a cane. Asked him to get a rolling walker with a seat as well as a straight cane and to use one or the other at all times.    [name] will return to see me in 3mos.    ______________________________________________________________________    HPI:  [name] is a 72 y.o. man s/p LURD kidney transplant (spouse) 08/21/2011 and B/L creatinine ~1.4-1.7 who returns for f/u. 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 He has significant PAD and neuropathy and has no sensation on either leg below his knees.     Glycemic control worsened quite a bit with HbA1c 9.4% last month. Just received notice that his trulicity is now covered by his insurance but unclear what this cost will be for him. Continues to work with Dr. Tiburcio Ellis on this who has been adjusting his insulin regimen. Has a CGM. Also continues to be seen by wound care. Eye sight is doing okay and s/p vitreal hemorrhage requiring PPV/endolaser over the summer 2018.     Since last seen, patient has had  no hospitalizations or ED visits. He was followed by vascular surgery, internal medicine, and transplant nephrology in the interim.    He presents today feeling overall well. He reports cutting his R leg recently while getting out of bed at night. He was evaluated by podiatry and started on an antibiotic for infection, changes dressings daily. He also reports suffering a fall recently and endorses lower back and hip pain. He has been exercising regularly and reports losing 13 lbs. He and his wife, David Ellis, are planning a 50th wedding anniversary cruise in [place] in October.     He denies fever, chills, chest pain, shortness of breath, nausea, vomiting, diarrhea, hematuria, dysuria, or changes in LE edema.      ROS: As per HPI. The remainder of the 10 system review is negative.    PAST MEDICAL HISTORY:  Past Medical History:   Diagnosis Date   ??? Atrial flutter (CMS-HCC)    ??? Diabetes mellitus (CMS-HCC)    ??? Diabetic nephropathy (CMS-HCC)    ??? Diabetic retinopathy (CMS-HCC)    ??? Fractures    ??? Ganglion cyst    ??? Hand injury    ??? Heart disease    ??? Hyperlipidemia    ??? Hypertension    ??? Joint pain    ??? Osteomyelitis (CMS-HCC) June 2016   ??? Pulmonary embolism (CMS-HCC) Dec 2015   ??? 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 Stroke (CMS-HCC)    ??? Tear of meniscus of knee    ??? Transplanted kidney 08/21/2011       ALLERGIES  Levofloxacin; Lidocaine; Penicillins; Egg derived; Enalapril; Epinephrine; Grass pollen-bermuda, standard; Lisinopril; and Mepivacaine hcl    MEDICATIONS:  Current Outpatient Medications   Medication Sig Dispense Refill   ??? aspirin (ECOTRIN) 81 MG tablet Take 81 mg by mouth continuous as needed. Only when flying and/or traveling by car more than 2 hours.     ??? BD INSULIN SYRINGE ULTRA-FINE 0.3 mL 31 gauge x 15/64 Syrg 1 each by Other route Three (3) times a day. 300 Syringe 3   ??? BD ULTRA-FINE NANO PEN NEEDLE 32 gauge x 5/32 Ndle USE UP TO 3 TIMES A DAY 300 each 3   ??? blood sugar diagnostic (FREESTYLE PRECISION NEO STRIPS) Strp Use to test blood sugar 4 times daily. Dx E11.65. 150 each 5   ??? chlorthalidone (HYGROTON) 25 MG tablet TAKE ONE TABLET BY MOUTH EVERY DAY 60 tablet 5   ??? cholecalciferol, vitamin D3, (VITAMIN D3) 2,000 unit Tab Take 2,000 Units by mouth daily.      ??? doxycycline (VIBRA-TABS) 100 MG tablet Take 1 tablet (100 mg total) by mouth Two (2) times a day. for 10 days 20 tablet 0   ??? furosemide (LASIX) 40 MG tablet TAKE 1 TO 2 TABLETS ONCE A DAY 180 tablet 3   ??? gabapentin (NEURONTIN) 300 MG capsule TAKE 1 CAPSULE TWICE DAILY 180 capsule 5   ??? irbesartan (AVAPRO) 150 MG tablet TAKE ONE TABLET AT BEDTIME 90 tablet 3   ??? ketoconazole (NIZORAL) 2 % cream   0   ??? magnesium chloride (SLOW_MAG) 64 mg TbEC Take 128 mg by mouth three (3) times a day (at 6am, noon and 6pm).      ??? metoprolol succinate (TOPROL-XL) 200 MG 24 hr tablet TAKE ONE TABLET BY MOUTH TWICE DAILY 180 tablet 3   ??? multivitamin capsule Take 1 capsule by mouth daily.     ???  mycophenolate (MYFORTIC) 180 MG EC tablet Take 3 tablets (540 mg total) by mouth Two (2) times a day. 540 tablet 11   ??? pravastatin (PRAVACHOL) 20 MG tablet Take 1 tablet (20 mg total) by mouth daily. 90 tablet 3   ??? silver sulfaDIAZINE (SILVADENE) 1 % cream Apply topically daily. 50 g 0   ??? tacrolimus (PROGRAF) 1 MG capsule Take 3 capsules (3 mg total) by mouth two (2) times a day. 180 capsule 11   ??? warfarin (COUMADIN) 5 MG tablet Take 5 mg every day except mondays 30 tablet 6   ??? blood-glucose meter kit Use as instructed. 1 each 0   ??? glucagon, human recombinant, (GLUCAGON) 1 mg injection Follow package directions for low blood sugar. (Patient not taking: Reported on 12/12/2017) 1 mg 1   ??? HUMULIN R U-500, CONC, KWIKPEN 500 unit/mL (3 mL) CONCENTRATED injection DIAL TO MAX 100 UNITS BEFORE BREAKFAST, LUNCH, AND DINNER.  INJECT BEFOREMEALS MAX OF 300 UNITS DAILY 18 mL 11   ??? hydrocortisone 2.5 % cream   0   ??? lancets Misc 1 each by Miscellaneous route Four (4) times a day. 150 each 6   ??? warfarin (COUMADIN) 2.5 MG tablet   5     No current facility-administered medications for this visit.      Facility-Administered Medications Ordered in Other Visits   Medication Dose Route Frequency Provider Last Rate Last Dose   ??? bevacizumab    PRN (once a day) Melvyn Novas, MD   2.5 mg at 01/28/17 0750       PHYSICAL EXAM:  BP 88/50  - Pulse 60  - Temp 36.7 ??C (98 ??F) (Temporal)  - Ht 185.4 cm (6' 0.99)  - Wt (!) 121.5 kg (267 lb 12.8 oz)  - BMI 35.34 kg/m??    CONSTITUTIONAL: Alert,well appearing, no distress  CARDIOVASCULAR: Regular, normal S1/S2 heart sounds  PULM: Clear to auscultation bilaterally  EXTREMITIES: 1+ B/L LE edema (L>R chronic)  NEUROLOGIC: Nonfocal    MEDICAL DECISION MAKING    Results for orders placed or performed in visit on 11/27/17   Comprehensive Metabolic Panel   Result Value Ref Range    Sodium 135 135 - 145 mmol/L    Potassium 4.4 3.5 - 5.0 mmol/L    Chloride 97 (L) 98 - 107 mmol/L    CO2 28.0 22.0 - 30.0 mmol/L    Anion Gap 10 9 - 15 mmol/L    BUN 47 (H) 7 - 21 mg/dL    Creatinine 1.30 (H) 0.70 - 1.30 mg/dL    BUN/Creatinine Ratio 29     EGFR CKD-EPI Non-African American, Male 42 (L) >=60 mL/min/1.80m2    EGFR CKD-EPI African American, Male 49 (L) >=60 mL/min/1.23m2    Glucose 375 (H) 65 - 179 mg/dL    Calcium 8.9 8.5 - 86.5 mg/dL    Albumin 3.7 3.5 - 5.0 g/dL    Total Protein 6.2 (L) 6.5 - 8.3 g/dL    Total Bilirubin 0.8 0.0 - 1.2 mg/dL    AST 22 19 - 55 U/L    ALT 32 19 - 72 U/L    Alkaline Phosphatase 78 38 - 126 U/L   Phosphorus Level   Result Value Ref Range    Phosphorus 3.7 2.9 - 4.7 mg/dL   PT-INR   Result Value Ref Range    PT 28.7 (H) 10.2 - 12.8 sec    INR 2.52    Tacrolimus Level, Timed ([place])  Result Value Ref Range    Tacrolimus, Timed 5.7 ng/mL   Hemoglobin A1c   Result Value Ref Range    Hemoglobin A1C 9.4 (H) 4.8 - 5.6 %    Estimated Average Glucose 223 mg/dL   Lipid Panel   Result Value Ref Range    Triglycerides 250 (H) 1 - 149 mg/dL    Cholesterol 573 220 - 199 mg/dL    HDL 28 (L) 40 - 59 mg/dL    LDL Calculated 36 (L) 60 - 99 mg/dL    VLDL Cholesterol Cal 50 (H) 12 - 42 mg/dL    Chol/HDL Ratio 4.1 <2.5    Non-HDL Cholesterol 86 mg/dL    FASTING No    Urinalysis with Culture Reflex   Result Value Ref Range    Color, UA Yellow     Clarity, UA Clear     Specific Gravity, UA 1.017 1.003 - 1.030    pH, UA 5.5 5.0 - 9.0    Leukocyte Esterase, UA Negative Negative    Nitrite, UA Negative Negative    Protein, UA Trace (A) Negative    Glucose, UA >1000 mg/dL (A) Negative    Ketones, UA Negative Negative    Urobilinogen, UA 0.2 mg/dL 0.2 mg/dL, 1.0 mg/dL    Bilirubin, UA Negative Negative    Blood, UA Negative Negative    RBC, UA <1 <=3 /HPF    WBC, UA 1 <=2 /HPF    Squam Epithel, UA <1 0 - 5 /HPF    Bacteria, UA None Seen None Seen /HPF    Hyaline Casts, UA 1 0 - 1 /LPF    Mucus, UA Rare (A) None Seen /HPF   PTH   Result Value Ref Range    PTH 192.3 (H) 12.0 - 72.0 pg/mL    Calcium 8.9 8.5 - 10.2 mg/dL   CBC w/ Differential   Result Value Ref Range    WBC 6.7 4.5 - 11.0 10*9/L    RBC 5.16 4.50 - 5.90 10*12/L    HGB 14.6 13.5 - 17.5 g/dL    HCT 42.7 06.2 - 37.6 %    MCV 86.2 80.0 - 100.0 fL    MCH 28.4 26.0 - 34.0 pg    MCHC 32.9 31.0 - 37.0 g/dL    RDW 28.3 15.1 - 76.1 %    MPV 8.0 7.0 - 10.0 fL    Platelet 240 150 - 440 10*9/L    Neutrophils % 74.0 %    Lymphocytes % 16.2 %    Monocytes % 6.5 %    Eosinophils % 1.2 %    Basophils % 0.5 %    Absolute Neutrophils 4.9 2.0 - 7.5 10*9/L    Absolute Lymphocytes 1.1 (L) 1.5 - 5.0 10*9/L    Absolute Monocytes 0.4 0.2 - 0.8 10*9/L    Absolute Eosinophils 0.1 0.0 - 0.4 10*9/L    Absolute Basophils 0.0 0.0 - 0.1 10*9/L    Large Unstained Cells 2 0 - 4 %     *Note: Due to a large number of results and/or encounters for the requested time period, some results have not been displayed. A complete set of results can be found in Results Review.        Lab Results   Component Value Date    NA 135 11/27/2017    K 4.4 11/27/2017    CL 97 (L) 11/27/2017    CO2 28.0 11/27/2017    BUN 47 (H) 11/27/2017  CREATININE 1.62 (H) 11/27/2017    GFRAA 51 (L) 10/29/2017    GFRNONAA 42 (L) 10/29/2017    ALBUMIN 3.7 11/27/2017     Lab Results   Component Value Date    WBC 6.7 11/27/2017    HGB 14.6 11/27/2017    HCT 44.4 11/27/2017    PLT 240 11/27/2017     Lab Results   Component Value Date    PTH 192.3 (H) 11/27/2017    CALCIUM 8.9 11/27/2017 CALCIUM 8.9 11/27/2017    PHOS 3.7 11/27/2017       Scribe's Attestation: Trey Sailors, MD obtained and performed the history, physical exam and medical decision making elements that were entered into the chart.  Signed by Gaye Alken, Scribe, on December 12, 2017 at 3:58 PM.    Attending Statement: Documentation assistance provided by the Scribe. I was present during the time the encounter was recorded. The information recorded by the Scribe was done at my direction and has been reviewed and validated by me.

## 2017-12-10 NOTE — Unmapped (Signed)
This is a patient with a history of osteomyelitis of the foot.  He underwent a minor amputation.  This is healed.  More recently he is developed a wound in the pretibial region of the right lower extremity.  This is secondary to trauma.    He had a noninvasive arterial studies today which showed outflow and runoff obstruction on the right side and runoff obstruction on the left.  He has no symptoms related to his peripheral arterial disease.    There is a wound with mild cellulitis present in the pretibial region of the right lower extremity.  There is some mild erythema surrounding the wound there is no drainage present    Impression wound as above    Plan we will have him clean the wound with triple cleansing solution.  In addition we will have him put Silvadene on the wound.

## 2017-12-12 ENCOUNTER — Ambulatory Visit: Admit: 2017-12-12 | Discharge: 2017-12-13 | Payer: MEDICARE

## 2017-12-12 DIAGNOSIS — Z79899 Other long term (current) drug therapy: Secondary | ICD-10-CM

## 2017-12-12 DIAGNOSIS — N183 Chronic kidney disease, stage 3 (moderate): Secondary | ICD-10-CM

## 2017-12-12 DIAGNOSIS — Z940 Kidney transplant status: Principal | ICD-10-CM

## 2017-12-12 MED ORDER — DULAGLUTIDE 0.75 MG/0.5 ML SUBCUTANEOUS PEN INJECTOR
INJECTION | SUBCUTANEOUS | 0 refills | 0 days | Status: CP
Start: 2017-12-12 — End: 2018-01-30

## 2017-12-24 ENCOUNTER — Ambulatory Visit: Admit: 2017-12-24 | Discharge: 2017-12-25 | Payer: MEDICARE | Attending: Vascular Surgery | Primary: Vascular Surgery

## 2017-12-24 ENCOUNTER — Ambulatory Visit: Admit: 2017-12-24 | Discharge: 2017-12-25 | Payer: MEDICARE

## 2017-12-24 DIAGNOSIS — I159 Secondary hypertension, unspecified: Secondary | ICD-10-CM

## 2017-12-24 DIAGNOSIS — E1129 Type 2 diabetes mellitus with other diabetic kidney complication: Principal | ICD-10-CM

## 2017-12-24 DIAGNOSIS — E1165 Type 2 diabetes mellitus with hyperglycemia: Secondary | ICD-10-CM

## 2017-12-24 DIAGNOSIS — T8610 Unspecified complication of kidney transplant: Secondary | ICD-10-CM

## 2017-12-24 DIAGNOSIS — Z940 Kidney transplant status: Secondary | ICD-10-CM

## 2017-12-24 DIAGNOSIS — E782 Mixed hyperlipidemia: Secondary | ICD-10-CM

## 2017-12-24 DIAGNOSIS — I4891 Unspecified atrial fibrillation: Secondary | ICD-10-CM

## 2017-12-24 DIAGNOSIS — I2699 Other pulmonary embolism without acute cor pulmonale: Secondary | ICD-10-CM

## 2017-12-24 LAB — URINALYSIS WITH CULTURE REFLEX
BACTERIA: NONE SEEN /HPF
BILIRUBIN UA: NEGATIVE
BLOOD UA: NEGATIVE
GLUCOSE UA: 150 — AB
LEUKOCYTE ESTERASE UA: NEGATIVE
NITRITE UA: NEGATIVE
PH UA: 5.5 (ref 5.0–9.0)
PROTEIN UA: NEGATIVE
RBC UA: <1 /HPF (ref <=3)
SPECIFIC GRAVITY UA: 1.016 (ref 1.003–1.030)
SQUAMOUS EPITHELIAL: <1 /HPF (ref 0–5)
UROBILINOGEN UA: 0.2
WBC UA: 1 /HPF (ref <=2)

## 2017-12-24 LAB — CBC W/ AUTO DIFF
BASOPHILS ABSOLUTE COUNT: 0 10*9/L (ref 0.0–0.1)
BASOPHILS RELATIVE PERCENT: 0.3 %
EOSINOPHILS ABSOLUTE COUNT: 0.1 10*9/L (ref 0.0–0.4)
EOSINOPHILS RELATIVE PERCENT: 0.7 %
HEMATOCRIT: 44.2 % (ref 41.0–53.0)
LARGE UNSTAINED CELLS: 1 % (ref 0–4)
LYMPHOCYTES ABSOLUTE COUNT: 0.8 10*9/L — ABNORMAL LOW (ref 1.5–5.0)
MEAN CORPUSCULAR HEMOGLOBIN CONC: 32.7 g/dL (ref 31.0–37.0)
MEAN CORPUSCULAR HEMOGLOBIN: 28.4 pg (ref 26.0–34.0)
MEAN PLATELET VOLUME: 7.8 fL (ref 7.0–10.0)
MONOCYTES ABSOLUTE COUNT: 0.4 10*9/L (ref 0.2–0.8)
MONOCYTES RELATIVE PERCENT: 4.5 %
NEUTROPHILS ABSOLUTE COUNT: 7 10*9/L (ref 2.0–7.5)
NEUTROPHILS RELATIVE PERCENT: 84.2 %
PLATELET COUNT: 240 10*9/L (ref 150–440)
RED BLOOD CELL COUNT: 5.09 10*12/L (ref 4.50–5.90)
WBC ADJUSTED: 8.3 10*9/L (ref 4.5–11.0)

## 2017-12-24 LAB — COMPREHENSIVE METABOLIC PANEL
ALBUMIN: 3.7 g/dL (ref 3.5–5.0)
ALKALINE PHOSPHATASE: 78 U/L (ref 38–126)
ALT (SGPT): 25 U/L (ref 19–72)
ANION GAP: 10 mmol/L (ref 9–15)
AST (SGOT): 23 U/L (ref 19–55)
BILIRUBIN TOTAL: 0.7 mg/dL (ref 0.0–1.2)
BLOOD UREA NITROGEN: 54 mg/dL — ABNORMAL HIGH (ref 7–21)
BUN / CREAT RATIO: 35
CALCIUM: 9.1 mg/dL (ref 8.5–10.2)
CO2: 25 mmol/L (ref 22.0–30.0)
CREATININE: 1.53 mg/dL — ABNORMAL HIGH (ref 0.70–1.30)
EGFR CKD-EPI AA MALE: 52 mL/min/{1.73_m2} — ABNORMAL LOW (ref >=60)
EGFR CKD-EPI NON-AA MALE: 45 mL/min/{1.73_m2} — ABNORMAL LOW (ref >=60)
GLUCOSE RANDOM: 340 mg/dL — ABNORMAL HIGH (ref 65–179)
POTASSIUM: 4.4 mmol/L (ref 3.5–5.0)
PROTEIN TOTAL: 6.3 g/dL — ABNORMAL LOW (ref 6.5–8.3)
SODIUM: 134 mmol/L — ABNORMAL LOW (ref 135–145)

## 2017-12-24 LAB — TACROLIMUS BLOOD: Lab: 7.4

## 2017-12-24 LAB — NEUTROPHILS RELATIVE PERCENT: Lab: 84.2

## 2017-12-24 LAB — BLOOD UREA NITROGEN: Urea nitrogen:MCnc:Pt:Ser/Plas:Qn:: 54 — ABNORMAL HIGH

## 2017-12-24 LAB — KETONES UA: Lab: NEGATIVE

## 2017-12-24 LAB — PROTIME-INR: INR: 2.57

## 2017-12-24 LAB — PROTIME: Lab: 29.3 — ABNORMAL HIGH

## 2017-12-24 NOTE — Unmapped (Signed)
Per endocrniology.

## 2017-12-24 NOTE — Unmapped (Signed)
DOing eryw ell.  Contiue meds.

## 2017-12-24 NOTE — Unmapped (Signed)
Check albs today.  Numbers good.    Back in 4 months.

## 2017-12-24 NOTE — Unmapped (Signed)
Patient ID: David Ellis is a 72 y.o. male who presents for eval of medical issues.      Assessment/Plan:        Type II diabetes mellitus with renal manifestations, uncontrolled (CMS-HCC)  Per endocrniology.      Hypertension  DOing eryw ell.  Contiue meds.    Atrial fibrillation (CMS-HCC)  COntinue coumadin indefinitely.        Pulmonary embolism (CMS-HCC)  Continue coumadin; INR toyda.      Transplanted kidney  Creatinine and Tac level today.     RTC in 4 months or prn.    Orders Placed This Encounter   Procedures   ??? Comprehensive Metabolic Panel   ??? CBC w/ Differential   ??? PT-INR   ??? Tacrolimus Level, Timed ([place])       -- Discussed the new prescription noted above, including potential side effects, drug interactions, instructions for taking the medication, and the consequences of not taking it.  -- Patient verbalized an understanding of today's assessment and recommendations, as well as the purpose of ongoing medications.    Subjective:     Current Health Status  Patient Active Problem List    Diagnosis Date Noted   ??? Routine general medical examination at a health care facility 11/17/2013     Priority: High   ??? Atrial fibrillation (CMS-HCC) 07/06/2013     Priority: High   ??? Right ventricular dysfunction 06/30/2013     Priority: High   ??? Pulmonary embolism (CMS-HCC) 05/23/2013     Priority: High   ??? Transplanted kidney      Priority: High   ??? Mixed hyperlipidemia 10/30/2011     Priority: High   ??? Type II diabetes mellitus with renal manifestations, uncontrolled (CMS-HCC) 03/26/2011     Priority: High   ??? Hypertension 10/10/2010     Priority: High   ??? PAD (peripheral artery disease) (CMS-HCC) 02/12/2017   ??? Hypoglycemia associated with type 2 diabetes mellitus (CMS-HCC) 09/03/2016   ??? BCC (basal cell carcinoma), face 07/18/2015   ??? Left knee pain 11/09/2014   ??? On statin therapy due to risk of future cardiovascular event 10/04/2014   ??? Toe amputation status (CMS-HCC) 08/10/2014   ??? Acquired absence of other toe(s), unspecified side (CMS-HCC) 08/10/2014   ??? Diabetic polyneuropathy associated with type 2 diabetes mellitus (CMS-HCC) 03/15/2014   ??? Aftercare following organ transplant 08/27/2013   ??? Edema 06/30/2013   ??? 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 History of kidney transplant 10/30/2011   ??? 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 Diverticulosis of colon 03/09/2011   ??? History of colonic polyps 03/09/2011        Here today for eval of medical issues.  He reports that generally he is doing well.    He did have some right shin infection, but completely improved with doxycycline.  Minimal drainage and drying up well now.    REports that his breathing is doing well.  REmains on coumadin for both afib and massive PE.  TOlerating well.  No bleeding.   INR through me.      He will be starting trulicity with endocrinology for his DM and will follow up with them for that.    NO other complaints.  The patient is without complaints of chest pain, sob, fever, chills, nausea, vomiting, diarrhea, constipation, BRBPR, melena, abdominal pains, weight changes, confusion, headaches.  Allergies   Allergen Reactions   ??? Levofloxacin Other (See Comments)     Other reaction(s): Unknown  Unknown   ??? Lidocaine Other (See Comments)     I don't remember States is fine with Bupivicaine   ??? Penicillins      As child  Other reaction(s): UNKNOWN.   (UPDATE: Tolerated amp/sulbactam without side effects during 10/2014 hospitalization)   ??? Egg Derived Rash   ??? Enalapril Cough   ??? Epinephrine Palpitations   ??? Grass Pollen-[place], Standard Itching   ??? Lisinopril Rash   ??? Mepivacaine Hcl Palpitations       Current Outpatient Medications   Medication Sig Dispense Refill   ??? BD INSULIN SYRINGE ULTRA-FINE 0.3 mL 31 gauge x 15/64 Syrg 1 each by Other route Three (3) times a day. 300 Syringe 3   ??? BD ULTRA-FINE NANO PEN NEEDLE 32 gauge x 5/32 Ndle USE UP TO 3 TIMES A DAY 300 each 3   ??? blood sugar diagnostic (FREESTYLE PRECISION NEO STRIPS) Strp Use to test blood sugar 4 times daily. Dx E11.65. 150 each 5   ??? chlorthalidone (HYGROTON) 25 MG tablet TAKE ONE TABLET BY MOUTH EVERY DAY 60 tablet 5   ??? cholecalciferol, vitamin D3, (VITAMIN D3) 2,000 unit Tab Take 2,000 Units by mouth daily.      ??? furosemide (LASIX) 40 MG tablet TAKE 1 TO 2 TABLETS ONCE A DAY 180 tablet 3   ??? gabapentin (NEURONTIN) 300 MG capsule TAKE 1 CAPSULE TWICE DAILY 180 capsule 5   ??? HUMULIN R U-500, CONC, KWIKPEN 500 unit/mL (3 mL) CONCENTRATED injection DIAL TO MAX 100 UNITS BEFORE BREAKFAST, LUNCH, AND DINNER.  INJECT BEFOREMEALS MAX OF 300 UNITS DAILY 18 mL 11   ??? irbesartan (AVAPRO) 150 MG tablet TAKE ONE TABLET AT BEDTIME 90 tablet 3   ??? lancets Misc 1 each by Miscellaneous route Four (4) times a day. 150 each 6   ??? magnesium chloride (SLOW_MAG) 64 mg TbEC Take 128 mg by mouth three (3) times a day (at 6am, noon and 6pm).      ??? metoprolol succinate (TOPROL-XL) 200 MG 24 hr tablet TAKE ONE TABLET BY MOUTH TWICE DAILY 180 tablet 3   ??? multivitamin capsule Take 1 capsule by mouth daily.     ??? mycophenolate (MYFORTIC) 180 MG EC tablet Take 3 tablets (540 mg total) by mouth Two (2) times a day. 540 tablet 11   ??? pravastatin (PRAVACHOL) 20 MG tablet Take 1 tablet (20 mg total) by mouth daily. 90 tablet 3   ??? silver sulfaDIAZINE (SILVADENE) 1 % cream Apply topically daily. 50 g 0   ??? tacrolimus (PROGRAF) 1 MG capsule Take 3 capsules (3 mg total) by mouth two (2) times a day. 180 capsule 11   ??? warfarin (COUMADIN) 2.5 MG tablet   5   ??? warfarin (COUMADIN) 5 MG tablet Take 5 mg every day except mondays 30 tablet 6   ??? aspirin (ECOTRIN) 81 MG tablet Take 81 mg by mouth continuous as needed. Only when flying and/or traveling by car more than 2 hours.     ??? blood-glucose meter kit Use as instructed. 1 each 0   ??? dulaglutide (TRULICITY) 0.75 mg/0.5 mL injection pen Inject 0.5 mL (0.75 mg total) under the skin once a week. (Patient not taking: Reported on 12/24/2017) 12 Syringe 0   ??? glucagon, human recombinant, (GLUCAGON) 1 mg injection Follow package directions for low blood sugar. (Patient not taking: Reported on  12/12/2017) 1 mg 1   ??? hydrocortisone 2.5 % cream   0   ??? ketoconazole (NIZORAL) 2 % cream   0     No current facility-administered medications for this visit.      Facility-Administered Medications Ordered in Other Visits   Medication Dose Route Frequency Provider Last Rate Last Dose   ??? bevacizumab    PRN (once a day) Melvyn Novas, MD   2.5 mg at 01/28/17 0750       Past Medical History:   Diagnosis Date   ??? Atrial flutter (CMS-HCC)    ??? Diabetes mellitus (CMS-HCC)    ??? Diabetic nephropathy (CMS-HCC)    ??? Diabetic retinopathy (CMS-HCC)    ??? Fractures    ??? Ganglion cyst    ??? Hand injury    ??? Heart disease    ??? Hyperlipidemia    ??? Hypertension    ??? Joint pain    ??? Osteomyelitis (CMS-HCC) June 2016   ??? Pulmonary embolism (CMS-HCC) Dec 2015   ??? 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 Stroke (CMS-HCC)    ??? Tear of meniscus of knee    ??? 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 NEPHRECTOMY TRANSPLANTED ORGAN Right     LURD - spouse received in 2013.   ??? PR AMPUTATION METATARSAL+TOE,SINGLE Left 07/06/2014    Procedure: AMPUTATION, METATARSAL, WITH TOE SINGLE;  Surgeon: Marion Downer, MD;  Location: MAIN OR [facility];  Service: Vascular   ??? PR AMPUTATION METATARSAL+TOE,SINGLE Left 10/28/2014    Procedure: AMPUTATION, METATARSAL, WITH TOE SINGLE;  Surgeon: Maple Mirza, MD;  Location: MAIN [facility];  Service: Vascular   ??? PR VITRECTOMY,PANRETINAL LASER RX Right 11/26/2016    Procedure: VITRECTOMY, MECHANICAL, PARS PLANA APPROACH; WITH ENDOLASER PANRETINAL PHOTOCOAGULATION;  Surgeon: Melvyn Novas, MD;  Location: [facility] OR [facility];  Service: Ophthalmology   ??? PR VITRECTOMY,PANRETINAL LASER RX Left 01/28/2017 Procedure: VITRECTOMY, MECHANICAL, PARS PLANA APPROACH; WITH ENDOLASER PANRETINAL PHOTOCOAGULATION;  Surgeon: Melvyn Novas, MD;  Location: [facility] OR [facility];  Service: Ophthalmology   ??? SKIN BIOPSY         Family History   Problem Relation Age of Onset   ??? Kidney disease Mother    ??? Diabetes Mother    ??? Kidney disease Father    ??? Kidney disease Maternal Grandmother    ??? Diabetes Maternal Grandmother        Social History     Tobacco Use   ??? Smoking status: Never Smoker   ??? Smokeless tobacco: Never Used   Substance Use Topics   ??? Alcohol use: No   ??? Drug use: No             Objective:       Vital Signs  BP 112/60 (BP Site: L Arm, BP Position: Sitting)  - Pulse 69  - Ht 185.4 cm (6' 1)  - Wt (!) 121.2 kg (267 lb 3.2 oz)  - SpO2 95%  - BMI 35.25 kg/m??      Exam  Physical Exam:    Well developed, well nourished male in no acute distress.      Vitals:    12/24/17 0843   BP: 112/60   Pulse: 69   SpO2: 95%     LUNGS:  CTA  CARDIOVASCULAR:  IRR without murmurs, or  gallops, or rubs.  Pulses 2+ bilaterally and symmetric.    ABDOMEN:  NABS/ NT/ND/soft.  NO hsm nor abdominal masses.    EXTREMITIES:  No c/c/e.  No ulcers or abrasions.  Front right shin with well healed wound.

## 2017-12-24 NOTE — Unmapped (Signed)
COntinue coumadin indefinitely.

## 2017-12-24 NOTE — Unmapped (Signed)
Creatinine and Tac level today.

## 2017-12-24 NOTE — Unmapped (Signed)
Continue coumadin; INR toyda.

## 2017-12-27 NOTE — Unmapped (Signed)
-----   Message from Sharlee Blew, MD sent at 12/27/2017  8:01 AM EDT -----  Call and let patient know labs stable.  INR ok.  Same dose; repeat INR one month.    [name], M.D.     [facility]   [address]  [address]  [place], [place]  [address]  [phone]

## 2017-12-27 NOTE — Unmapped (Signed)
Message relayed to patient. No questions at this time. Anticoag tracker updated.

## 2017-12-30 NOTE — Unmapped (Signed)
[facility] ([facility]                                  RECRUITMENT SUMMARY NOTE     ?? Called patient today to introduce [facility] as part of [facility]  ?? Referral:No  ?? Call attempt:1st attempt  ?? Outcome of call: call completed with patient  ?? Discussed the following: Program Services and Expectations of participation  ?? Program status: Declined  ??    Lossie Faes  Clinical Operations Specialist/CPHT  [facility] ([facility])

## 2017-12-31 ENCOUNTER — Ambulatory Visit: Admit: 2017-12-31 | Discharge: 2017-12-31 | Payer: MEDICARE | Attending: "Endocrinology | Primary: "Endocrinology

## 2017-12-31 DIAGNOSIS — E11649 Type 2 diabetes mellitus with hypoglycemia without coma: Secondary | ICD-10-CM

## 2017-12-31 DIAGNOSIS — E1129 Type 2 diabetes mellitus with other diabetic kidney complication: Principal | ICD-10-CM

## 2017-12-31 DIAGNOSIS — E669 Obesity, unspecified: Secondary | ICD-10-CM

## 2017-12-31 DIAGNOSIS — E1165 Type 2 diabetes mellitus with hyperglycemia: Secondary | ICD-10-CM

## 2017-12-31 NOTE — Unmapped (Signed)
Freestyle libre uploaded to [employer]. POC glucose  Done A1C not due. [id number] 210 mg/dL

## 2017-12-31 NOTE — Unmapped (Signed)
ASSESSMENT / PLAN  1. Type II diabetes mellitus with renal manifestations, uncontrolled (CMS-HCC)  2. Hypoglycemia associated with type 2 diabetes mellitus (CMS-HCC)  3. Class 2 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, unspecified BMI, unspecified obesity type, unspecified whether serious comorbidity present  ?? Freestyle Libre suggests HgbA1c for the past 2 weeks of mid 7 range which is technically at goal, and much improved from measured HgbA1c of 9 in 11/2017  ?? We still have room for improvement to reduce spike in hyperglycemia mid morning, and to reduce risk for late afternoon hypoglycemia.  ?? Other than reducing insulin coverage with PNButter sandwich, no changes in insulin doses, but goal will be to  try and give insulin doses BEFORE meals to reduce post meal hyperglycemia:  ?? Give u500 before eating.  If glucoses are already rising when you wake before you eat, you could give up to before eating.     (9-10am) Bfast DIAL UP TO 105 UNITS (21 CLICKS)   ????????????????????????Reduce to 70 units if premeal glucose 75-100 (14 clicks)  ????????????????????????Reduce 60 units if glucose <??75 AND you are getting ready to eat a full meal (12 clicks)  (2pm) Lunch DIAL UP TO 55 UNITS (11 CLICKS)  Try reducing to 6 clicks with PNButter sandwich.  (7ish) Dinner: DIAL UP TO 40 UNITS (8 CLICKS)  For seafood, reduce to 30units (6 clicks)  ????????????????????????+45 ????units for glucose 201- 300 (9 clicks)  ????????????????????????+50 ????units 301- 400  (10 clicks)  If glucose <??100 at meal, reduce dose by 75%, and if glucose is <??75, reduce dose 50%.     ?? We have agreed to a 3-6 month trial of Trulicity to see if addition of this medication can help with weight loss/glucose control/reduction in insulin requirements.  Trulicity will not allow you to stop insulin.  If no improvement noted over this time frame, Trulicity does not have to be continued long term.   ?? Start 0.75mg  once weekly.  If any nausea, continue to see if this resolves.  If stomach pain and vomiting, stop drug and let me know.    ?? After one month, if well tolerated, we could provide new script to increase to max dose 1.5mg  weekly.   ?? If you notice improved glucoses, you will need to let me know so we can start reducing insulin doses.     MEDICARE:   *The patient requires a therapeutic CGM.   *The patient has diabetes mellitus and is insulin-treated with 3 or more daily injections of insulin(MDII).   *The patient has been using CGM and is checking glucose 4 or more times per day x 30 days.  *The patient???s insulin treatment regimen requires frequent adjustments by the patient on the basis of therapeutic CGM testing results.   *Within the last six months prior to ordering the CGM, I had an in-person visit with the beneficiary to evaluate their diabetes control and determine that the above criteria are met.   *Every six months following the initial prescription of the CGM, I will have an in-person visit with the beneficiary to assess adherence to their CGM regimen and diabetes treatment plan.      RTC 4 weeks [would like to reassess how he is doing on lower dose Trulicity before increasing dose, and also want to check in before he goes on cruise in early October].    Greater than 50% of 40 min visit was spent face to face with patient on counseling and coordination of care regarding management  of insulin dependent diabetes with multiple complications as detailed in note.      SUBJECTIVE  PCP: [name]  Tplant: Amy Mottle  Optho:  Herma Ard  Wound: Keagy  Podiatry: Dr. Ether Griffins, Gavin Potters     HPI: David Ellis is a 72 y.o. male who I last saw 06/2017 returns for follow up of T2DM with hx of kidney transplant.    Here with wife today.     He is following regimen:  (9-10am) Bfast DIAL UP TO 105 UNITS (21 CLICKS)   ????????????????????????Reduce to 70 units if premeal glucose 75-100 (14 clicks)  ????????????????????????Reduce 60 units if glucose <??75 AND you are getting ready to eat a full meal (12 clicks)  (2pm) Lunch DIAL UP TO 55 UNITS (11 CLICKS)    (7ish) Dinner: DIAL UP TO 40 UNITS (8 CLICKS)  For seafood, reduce to 30units (6 clicks)  ????????????????????????+45 ????units for glucose 201- 300 (9 clicks)  ????????????????????????+50 ????units 301- 400  (10 clicks)  If glucose <??100 at meal, reduce dose by 75%, and if glucose is <??75, reduce dose 50%.     Dr. Chinita Pester has suggested he restart Trulicity which has been approved through 11/2018. [he never tried before b/c previously cost $600 a mo].   After discussion, willing to retry with reported now $125 per mo.  I am pleased to see he has lost 7 lbs since LV.  He does feel better with weight loss, more active, and would like to lose more.  With foot healing, he has been cleared for working out, so now trying to get to gym TIW.   He feels like stamina is improving with bike recliner.  He avoids treadmill and cannot use elliptical due to balance.   Has weights at home, intermittently used.     Although HgbA1c above goal 11/2017, he reports his glucoses much improved this past month, but he is not sure why.  In agreement with this, 14 day glucose AVG on Freestyle Libre of 164 suggests HgbA1c of mid 7 range which is at his goal.  In addition, he has 1/2 the amount of minutes spent w hypoglycemia as last visit.   If glucose <85 at bedtime, he has a protein bar, but otherwise, is not snacking at night on crackers,etc.  Trends noted are a rise between 8-10am even if not eating yet. He is also aware of risk in drop of glucose late afternoon if he only has PNButter sandwich for lunch prior).       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 S/p 2 operations June/Aug 2018 w Dr. Waunita Schooner.  FU 01/2018. He is aware of increase in blurry.   CKD followed by Nephrology.  Peripheral neuropathy w hx of amputation.  On Neurontin. Since LV has obtained new DM shoes with no open wounds on feet. Caring for a scrape on his R shin he got from bedframe.   BP controlled on meds including ARB  Lipids on low dose pravachol.    ASA only prn on chonic coumadin. Endocrine Obtained / Updated Problem List:   1. T2DM dx 1992, insulin roughly 2000:   ?? Proliferative DR, Stable, Wynonia Sours MD,  Eye  ?? CKD s/p renal transplant  ?? 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 No vibratory 06/2017.   ?? Hypoglycemic emergency requiring EMS 03/2011.   ?? Considered Victoza in past, though he was resistant in setting of costs and already multiple daily injections. Trulicity 600$ per mo. Ozempic  not covered by Medicare.  ?? Offered to check cpeptide to see if he would qualify for insulin pump under medcare, though he is not interested.    ?? Off MTF since end of 2015/early 2016.   2. 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   3. HTN   4. Nephrolithiasis   5. OSA on cpap   6. Aflutter s/p ablation 06/2011, atrial fibrillation  7. PE    Patient Active Problem List   Diagnosis   ??? Type II diabetes mellitus with renal manifestations, uncontrolled (CMS-HCC)   ??? Hypertension   ??? History of kidney transplant   ??? Mixed hyperlipidemia   ??? 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 Transplanted kidney   ??? Pulmonary embolism (CMS-HCC)   ??? Edema   ??? Right ventricular dysfunction   ??? Atrial fibrillation (CMS-HCC)   ??? Aftercare following organ transplant   ??? Diverticulosis of colon   ??? Routine general medical examination at a health care facility   ??? Diabetic polyneuropathy associated with type 2 diabetes mellitus (CMS-HCC)   ??? Toe amputation status (CMS-HCC)   ??? On statin therapy due to risk of future cardiovascular event   ??? Left knee pain   ??? BCC (basal cell carcinoma), face   ??? Acquired absence of other toe(s), unspecified side (CMS-HCC)   ??? History of colonic polyps   ??? 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 Hypoglycemia associated with type 2 diabetes mellitus (CMS-HCC)   ??? PAD (peripheral artery disease) (CMS-HCC)     Past Medical History:   Diagnosis Date   ??? Atrial flutter (CMS-HCC)    ??? Diabetes mellitus (CMS-HCC)    ??? Diabetic nephropathy (CMS-HCC)    ??? Diabetic retinopathy (CMS-HCC)    ??? Fractures    ??? Ganglion cyst    ??? Hand injury    ??? Heart disease    ??? Hyperlipidemia    ??? Hypertension    ??? Joint pain    ??? Osteomyelitis (CMS-HCC) June 2016   ??? Pulmonary embolism (CMS-HCC) Dec 2015   ??? 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 Stroke (CMS-HCC)    ??? Tear of meniscus of knee    ??? Transplanted kidney 08/21/2011     Medicines:     Current Outpatient Medications   Medication Sig Dispense Refill   ??? aspirin (ECOTRIN) 81 MG tablet Take 81 mg by mouth continuous as needed. Only when flying and/or traveling by car more than 2 hours.     ??? BD INSULIN SYRINGE ULTRA-FINE 0.3 mL 31 gauge x 15/64 Syrg 1 each by Other route Three (3) times a day. 300 Syringe 3   ??? BD ULTRA-FINE NANO PEN NEEDLE 32 gauge x 5/32 Ndle USE UP TO 3 TIMES A DAY 300 each 3   ??? blood sugar diagnostic (FREESTYLE PRECISION NEO STRIPS) Strp Use to test blood sugar 4 times daily. Dx E11.65. 150 each 5   ??? chlorthalidone (HYGROTON) 25 MG tablet TAKE ONE TABLET BY MOUTH EVERY DAY 60 tablet 5   ??? cholecalciferol, vitamin D3, (VITAMIN D3) 2,000 unit Tab Take 2,000 Units by mouth daily.      ??? dulaglutide (TRULICITY) 0.75 mg/0.5 mL injection pen Inject 0.5 mL (0.75 mg total) under the skin once a week. 12 Syringe 0   ??? furosemide (LASIX) 40  MG tablet TAKE 1 TO 2 TABLETS ONCE A DAY 180 tablet 3   ??? gabapentin (NEURONTIN) 300 MG capsule TAKE 1 CAPSULE TWICE DAILY 180 capsule 5   ??? glucagon, human recombinant, (GLUCAGON) 1 mg injection Follow package directions for low blood sugar. 1 mg 1   ??? HUMULIN R U-500, CONC, KWIKPEN 500 unit/mL (3 mL) CONCENTRATED injection DIAL TO MAX 100 UNITS BEFORE BREAKFAST, LUNCH, AND DINNER.  INJECT BEFOREMEALS MAX OF 300 UNITS DAILY 18 mL 11   ??? hydrocortisone 2.5 % cream   0   ??? irbesartan (AVAPRO) 150 MG tablet TAKE ONE TABLET AT BEDTIME 90 tablet 3   ??? ketoconazole (NIZORAL) 2 % cream   0   ??? lancets Misc 1 each by Miscellaneous route Four (4) times a day. 150 each 6   ??? magnesium chloride (SLOW_MAG) 64 mg TbEC Take 128 mg by mouth three (3) times a day (at 6am, noon and 6pm).      ??? metoprolol succinate (TOPROL-XL) 200 MG 24 hr tablet TAKE ONE TABLET BY MOUTH TWICE DAILY 180 tablet 3   ??? multivitamin capsule Take 1 capsule by mouth daily.     ??? mycophenolate (MYFORTIC) 180 MG EC tablet Take 3 tablets (540 mg total) by mouth Two (2) times a day. 540 tablet 11   ??? pravastatin (PRAVACHOL) 20 MG tablet Take 1 tablet (20 mg total) by mouth daily. 90 tablet 3   ??? silver sulfaDIAZINE (SILVADENE) 1 % cream Apply topically daily. 50 g 0   ??? tacrolimus (PROGRAF) 1 MG capsule Take 3 capsules (3 mg total) by mouth two (2) times a day. 180 capsule 11   ??? warfarin (COUMADIN) 2.5 MG tablet   5   ??? warfarin (COUMADIN) 5 MG tablet Take 5 mg every day except mondays 30 tablet 6   ??? blood-glucose meter kit Use as instructed. 1 each 0     No current facility-administered medications for this visit.      Facility-Administered Medications Ordered in Other Visits   Medication Dose Route Frequency Provider Last Rate Last Dose   ??? bevacizumab    PRN (once a day) Melvyn Novas, MD   2.5 mg at 01/28/17 1610       Social History: reviewed: his wife was kidney donor. rare etoh. no cig. 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     Other past medical history, medications, allergies and problem list are reviewed in the medical record    REVIEW OF SYSTEMS: Pertinent positives and negatives as in HPI, otherwise, all other systems reviewed are negative.     OBJECTIVE  Vitals:    12/31/17 0959   BP: 126/59   Pulse: 63   Weight: (!) 121.7 kg (268 lb 4.8 oz)     Body mass index is 35.4 kg/m??.    BP Readings from Last 3 Encounters:   12/31/17 126/59   12/24/17 112/60   12/12/17 88/50     Wt Readings from Last 3 Encounters:   12/31/17 (!) 121.7 kg (268 lb 4.8 oz)   12/24/17 (!) 121.2 kg (267 lb 3.2 oz)   12/12/17 (!) 121.5 kg (267 lb 12.8 oz)     GENERAL: appears more energetic with better coloring than last visit.  PSYCH: calm, cooperative with full affect  EYES: eomi, sclera anicteric  NECK: thyroid normal size, no nodules  CV: sinus rate, reg rhythm, neither DP nor PT palpable   LUNG: CTA throughout  LE: no edema  R shin with dry  scabbed over vertical several cm long scrape, no surrounding errythema, no exudate  R 1st toe dorsal 2mm shallow scrape/scab with no surrounding edema.   L area over lateral amputed toes with some thickening/callousing of skin, no errythema, no opening, no warmth nor fluctuance    Pertinent labs:  Office Visit on 12/31/2017   Component Date Value Ref Range Status   ??? Glucose, POC 12/31/2017 210* 65 - 179 mg/dL Final   Office Visit on 12/24/2017   Component Date Value Ref Range Status   ??? Color, UA 12/24/2017 Yellow   Final   ??? Clarity, UA 12/24/2017 Clear   Final   ??? Specific Gravity, UA 12/24/2017 1.016  1.003 - 1.030 Final   ??? pH, UA 12/24/2017 5.5  5.0 - 9.0 Final   ??? Leukocyte Esterase, UA 12/24/2017 Negative  Negative Final   ??? Nitrite, UA 12/24/2017 Negative  Negative Final   ??? Protein, UA 12/24/2017 Negative  Negative Final   ??? Glucose, UA 12/24/2017 150 mg/dL* Negative Final   ??? Ketones, UA 12/24/2017 Negative  Negative Final   ??? Urobilinogen, UA 12/24/2017 0.2 mg/dL  0.2 mg/dL, 1.0 mg/dL Final   ??? Bilirubin, UA 12/24/2017 Negative  Negative Final   ??? Blood, UA 12/24/2017 Negative  Negative Final   ??? RBC, UA 12/24/2017 <1  <=3 /HPF Final   ??? WBC, UA 12/24/2017 1  <=2 /HPF Final   ??? Squam Epithel, UA 12/24/2017 <1  0 - 5 /HPF Final   ??? Bacteria, UA 12/24/2017 None Seen  None Seen /HPF Final   ??? Sodium 12/24/2017 134* 135 - 145 mmol/L Final   ??? Potassium 12/24/2017 4.4  3.5 - 5.0 mmol/L Final   ??? Chloride 12/24/2017 99  98 - 107 mmol/L Final   ??? CO2 12/24/2017 25.0  22.0 - 30.0 mmol/L Final   ??? Anion Gap 12/24/2017 10  9 - 15 mmol/L Final   ??? BUN 12/24/2017 54* 7 - 21 mg/dL Final   ??? Creatinine 12/24/2017 1.53* 0.70 - 1.30 mg/dL Final   ??? BUN/Creatinine Ratio 12/24/2017 35   Final   ??? EGFR CKD-EPI Non-African American,* 12/24/2017 45* >=60 mL/min/1.47m2 Final   ??? EGFR CKD-EPI African American, Male 12/24/2017 52* >=60 mL/min/1.85m2 Final   ??? Glucose 12/24/2017 340* 65 - 179 mg/dL Final   ??? Calcium 16/03/9603 9.1  8.5 - 10.2 mg/dL Final   ??? Albumin 54/02/8118 3.7  3.5 - 5.0 g/dL Final   ??? Total Protein 12/24/2017 6.3* 6.5 - 8.3 g/dL Final   ??? Total Bilirubin 12/24/2017 0.7  0.0 - 1.2 mg/dL Final   ??? AST 14/78/2956 23  19 - 55 U/L Final   ??? ALT 12/24/2017 25  19 - 72 U/L Final   ??? Alkaline Phosphatase 12/24/2017 78  38 - 126 U/L Final   ??? PT 12/24/2017 29.3* 10.2 - 12.8 sec Final   ??? INR 12/24/2017 2.57   Final   ??? Tacrolimus, Timed 12/24/2017 7.4  ng/mL Final   ??? WBC 12/24/2017 8.3  4.5 - 11.0 10*9/L Final   ??? RBC 12/24/2017 5.09  4.50 - 5.90 10*12/L Final   ??? HGB 12/24/2017 14.4  13.5 - 17.5 g/dL Final   ??? HCT 21/30/8657 44.2  41.0 - 53.0 % Final   ??? MCV 12/24/2017 87.0  80.0 - 100.0 fL Final   ??? MCH 12/24/2017 28.4  26.0 - 34.0 pg Final   ??? MCHC 12/24/2017 32.7  31.0 - 37.0 g/dL Final   ??? RDW  12/24/2017 14.0  12.0 - 15.0 % Final   ??? MPV 12/24/2017 7.8  7.0 - 10.0 fL Final   ??? Platelet 12/24/2017 240  150 - 440 10*9/L Final   ??? Neutrophils % 12/24/2017 84.2  % Final   ??? Lymphocytes % 12/24/2017 9.5  % Final   ??? Monocytes % 12/24/2017 4.5  % Final   ??? Eosinophils % 12/24/2017 0.7  % Final   ??? Basophils % 12/24/2017 0.3  % Final   ??? Absolute Neutrophils 12/24/2017 7.0  2.0 - 7.5 10*9/L Final   ??? Absolute Lymphocytes 12/24/2017 0.8* 1.5 - 5.0 10*9/L Final   ??? Absolute Monocytes 12/24/2017 0.4  0.2 - 0.8 10*9/L Final   ??? Absolute Eosinophils 12/24/2017 0.1  0.0 - 0.4 10*9/L Final   ??? Absolute Basophils 12/24/2017 0.0  0.0 - 0.1 10*9/L Final   ??? Large Unstained Cells 12/24/2017 1  0 - 4 % Final     Lab Results   Component Value Date    A1C 9.4 (H) 11/27/2017 A1C 8.9 (H) 10/29/2017    A1C 9.1 (H) 09/30/2017    A1C 9.0 (H) 07/29/2017    A1C 8.0 (H) 10/03/2015    A1C 6.7 (H) 08/04/2014    A1C 7.5 07/20/2014    A1C 7.1 (H) 07/04/2014    A1C 7.6 (H) 05/17/2014    A1C 8.0 (H) 11/23/2013    A1C 6.7 (H) 04/07/2012    A1C 7.4 (H) 03/30/2011     No components found for: SODIUM  Lab Results   Component Value Date    K 4.4 12/24/2017     Lab Results   Component Value Date    CREATININE 1.53 (H) 12/24/2017     Lab Results   Component Value Date    PTH 192.3 (H) 11/27/2017    CALCIUM 9.1 12/24/2017     Lab Results   Component Value Date    ALT 25 12/24/2017    ALT 22 04/21/2015     Lab Results   Component Value Date    CHOL 114 11/27/2017     Lab Results   Component Value Date    LDL 36 (L) 11/27/2017     Lab Results   Component Value Date    HDL 28 (L) 11/27/2017     Lab Results   Component Value Date    TRIG 250 (H) 11/27/2017     Lab Results   Component Value Date    TSH 2.202 07/02/2017    TSH 1.840 07/13/2015    TSH 1.420 07/09/2015     Lab Results   Component Value Date    TSH 2.202 07/02/2017     No results found for: CBC  No results found for: VITAMINB12  Lab Results   Component Value Date    VITD2 <5 08/17/2015    VITD3 42 08/17/2015    VITDTOTAL 33.8 03/01/2017     No results found for: Janeece Fitting, MALBCRERAT  Lab Results   Component Value Date    PROTEINUR <4.0 08/27/2017    PROTEINUR 15 08/05/2014    PCRATIOUR  08/27/2017      Comment:        Unable to calculate due to value below lower limit of assay linearity.    PCRATIOUR 0.116 08/05/2014       IMAGING:

## 2017-12-31 NOTE — Unmapped (Addendum)
Freestyle Libre suggests HgbA1c for the past 2 weeks of mid 7 range which is technically at goal.  We still have room for improvement to reduce spike mid morning, and reduce risk for late afternoon hypoglycemia.  Other than reducing insulin with PNButter sandwich, no changes in insulin doses, but try and give before meals:  Give u500 before eating.  If glucoses are already rising when you wake before you eat, you could give up to before eating.     (9-10am) Bfast DIAL UP TO 105 UNITS (21 CLICKS)   ????????????????????????Reduce to 70 units if premeal glucose 75-100 (14 clicks)  ????????????????????????Reduce 60 units if glucose <??75 AND you are getting ready to eat a full meal (12 clicks)  (2pm) Lunch DIAL UP TO 55 UNITS (11 CLICKS)  Try reducing to 6 clicks with PNButter sandwich.  (7ish) Dinner: DIAL UP TO 40 UNITS (8 CLICKS)  For seafood, reduce to 30units (6 clicks)  ????????????????????????+45 ????units for glucose 201- 300 (9 clicks)  ????????????????????????+50 ????units 301- 400  (10 clicks)  If glucose <??100 at meal, reduce dose by 75%, and if glucose is <??75, reduce dose 50%.     We have agreed to a 3-6 month trial of Trulicity to see if any further help with weight loss/glucose control/reduction in insulin requirements.   Start 0.75mg  once weekly.  If any nausea continue to see if this resolves.  If stomach pain and vomiting, stop drug and let me know.    After one month, if well tolerated, we could increase to max dose 1.5mg  weekly.   If you notice improved glucoses, you will need to let me know so we can start reducing insulin doses.

## 2018-01-03 NOTE — Unmapped (Signed)
[facility] Refill Coordination Note    Specialty Medication(s) to be Shipped:   Transplant: Myfortic 180mg  and Prograf 1mg      David Ellis, DOB: 12/23/1945  Phone: [phone] (home) [phone] (work)  Shipping Address: [address]  [place] [address]    All above HIPAA information was verified with patient.     Completed refill call assessment today to schedule patient's medication shipment from the [facility] [phone]).       Specialty medication(s) and dose(s) confirmed: Regimen is correct and unchanged.   Changes to medications: David Ellis reports starting the following medications: Trulicity 0.75mg  ( inject 1 injection once a week)  Changes to insurance: No  Questions for the pharmacist: No    The patient will receive an [facility]SI print out for each medication shipped and additional FDA Medication Guides as required.  Patient education from David Ellis or David Ellis may also be included in the shipment.    DISEASE/MEDICATION-SPECIFIC INFORMATION        N/A    ADHERENCE     Medication Adherence    Patient reported X missed doses in the last month:  0          MEDICARE PART B DOCUMENTATION     Myfortic 180mg : Patient has 10 days worth of tablets on hand.  Prograf 1mg : Patient has 10 days worth of capsules on hand.    SHIPPING     Shipping address confirmed in FSI.     Delivery Scheduled: Yes, Expected medication delivery date: 01/10/2018 via UPS or courier.     David Ellis   [facility] Specialty Technician

## 2018-01-06 NOTE — Unmapped (Signed)
Current Outpatient Medications on File Prior to Visit   Medication Sig   ??? aspirin (ECOTRIN) 81 MG tablet Take 81 mg by mouth continuous as needed. Only when flying and/or traveling by car more than 2 hours.   ??? BD INSULIN SYRINGE ULTRA-FINE 0.3 mL 31 gauge x 15/64 Syrg 1 each by Other route Three (3) times a day.   ??? BD ULTRA-FINE NANO PEN NEEDLE 32 gauge x 5/32 Ndle USE UP TO 3 TIMES A DAY   ??? blood sugar diagnostic (FREESTYLE PRECISION NEO STRIPS) Strp Use to test blood sugar 4 times daily. Dx E11.65.   ??? blood-glucose meter kit Use as instructed.   ??? chlorthalidone (HYGROTON) 25 MG tablet TAKE ONE TABLET BY MOUTH EVERY DAY   ??? cholecalciferol, vitamin D3, (VITAMIN D3) 2,000 unit Tab Take 2,000 Units by mouth daily.    ??? [EXPIRED] doxycycline (VIBRA-TABS) 100 MG tablet Take 1 tablet (100 mg total) by mouth Two (2) times a day. for 10 days   ??? dulaglutide (TRULICITY) 0.75 mg/0.5 mL injection pen Inject 0.5 mL (0.75 mg total) under the skin once a week.   ??? furosemide (LASIX) 40 MG tablet TAKE 1 TO 2 TABLETS ONCE A DAY   ??? gabapentin (NEURONTIN) 300 MG capsule TAKE 1 CAPSULE TWICE DAILY   ??? glucagon, human recombinant, (GLUCAGON) 1 mg injection Follow package directions for low blood sugar.   ??? HUMULIN R U-500, CONC, KWIKPEN 500 unit/mL (3 mL) CONCENTRATED injection DIAL TO MAX 100 UNITS BEFORE BREAKFAST, LUNCH, AND DINNER.  INJECT BEFOREMEALS MAX OF 300 UNITS DAILY   ??? hydrocortisone 2.5 % cream    ??? irbesartan (AVAPRO) 150 MG tablet TAKE ONE TABLET AT BEDTIME   ??? ketoconazole (NIZORAL) 2 % cream    ??? lancets Misc 1 each by Miscellaneous route Four (4) times a day.   ??? magnesium chloride (SLOW_MAG) 64 mg TbEC Take 128 mg by mouth three (3) times a day (at 6am, noon and 6pm).    ??? metoprolol succinate (TOPROL-XL) 200 MG 24 hr tablet TAKE ONE TABLET BY MOUTH TWICE DAILY   ??? multivitamin capsule Take 1 capsule by mouth daily.   ??? mycophenolate (MYFORTIC) 180 MG EC tablet Take 3 tablets (540 mg total) by mouth Two (2) times a day.   ??? pravastatin (PRAVACHOL) 20 MG tablet Take 1 tablet (20 mg total) by mouth daily.   ??? silver sulfaDIAZINE (SILVADENE) 1 % cream Apply topically daily.   ??? tacrolimus (PROGRAF) 1 MG capsule Take 3 capsules (3 mg total) by mouth two (2) times a day.   ??? warfarin (COUMADIN) 2.5 MG tablet    ??? warfarin (COUMADIN) 5 MG tablet Take 5 mg every day except mondays     Current Facility-Administered Medications on File Prior to Visit   Medication   ??? bevacizumab

## 2018-01-07 ENCOUNTER — Ambulatory Visit: Admit: 2018-01-07 | Discharge: 2018-01-08 | Payer: MEDICARE | Attending: Internal Medicine | Primary: Internal Medicine

## 2018-01-07 DIAGNOSIS — I4891 Unspecified atrial fibrillation: Secondary | ICD-10-CM

## 2018-01-07 DIAGNOSIS — I739 Peripheral vascular disease, unspecified: Principal | ICD-10-CM

## 2018-01-07 DIAGNOSIS — I10 Essential (primary) hypertension: Secondary | ICD-10-CM

## 2018-01-07 NOTE — Unmapped (Signed)
DECREASE the evening dose of metoprolol to 100 mg. Continue 200 mg in the morning. Let me know if you have any problems with this change

## 2018-01-07 NOTE — Unmapped (Signed)
Assessment and Plan:   David Ellis is a 72 y.o. male with a history of atrial fibrillation, hypertension, atrial flutter status post ablation, and pulmonary embolism status post catheter directed lytics 05/2013 with subsequent RV dysfunction who presents in clinic today for follow-up.    1. Atrial fibrillation  Symptomatically doing well.  We reviewed his medical regimen today.  He has not had symptomatic atrial fibrillation in quite some time.  I also reviewed that he is on quite a high dose of beta-blocker.  Heart rates are typically on the lower side.  With this in mind, we discussed lowering dose today.  Patient agreeable.  We will continue 200 mg in the morning but only take 100 mg in the evening.  We had previously discussed anticoagulation options and his preference is to remain on warfarin.    2. Hypertension, hyperkalemia  Blood pressure is well-controlled, we will monitor this as we adjust his dose of metoprolol as above.    3. Hyperlipidemia  Lipids well controlled, continue pravastatin.  Congratulated him on his healthy lifestyle changes and weight loss.         Vevelyn Francois, MD  [facility] Card[facility]  Pager [phone]      Subjective:   PCP: [name], MD  Patient: David Ellis  DOB: April 12, 1946    Reason for visit:  Afib, HTN, RV failure  HPI: KACEE KOREN is a 72 y.o. male with a history of atrial fibrillation, hypertension, atrial flutter status post ablation, and pulmonary embolism status post catheter directed lytics 05/2013 with subsequent RV dysfunction who presents in clinic today for follow-up. Since his last visit, he overall reports doing well from the cardiovascular standpoint.  He has not noticed any problems with palpitations or atrial fibrillation.  He also denies any shortness of breath.  He recalls one brief isolated episode of right-sided chest discomfort.  It lasted for just a moment.  He jokes that it occurred after Alvy Bimler left [place] to coach the [employer].  He has not had any sustained chest discomfort.  He otherwise reports 2 episodes of lightheadedness recently.  His wife reports that they occurred with episodes of panic while he was trying to get on a treadmill.  Patient was concerned that he might fall off.  Fortunately, he has generally been feeling better.  He has been exercising at the gym more regularly.  He goes on the bike for 30 minutes and has been walking more.  He even hit golf balls recently.  He has been lifting weights as well.  He overall reports doing very well.  He denies any fevers, nausea, or other complaints today.    ______________________________________________________________________  Pertinent Medical History, Cardiovascular History & Procedures:    Pertinent PMH:   ?? AFib  ?? Typical atrial flutter s/p ablation 06/2011  ?? DM  ?? 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 ESRD s/p transplant  ?? HTN  ?? OSA on CPAP  ?? Skin CA  ?? PE s/p catheter directed lytics 05/2013  ?? 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 Wedge defects in left lung without any thrombus visualized, could represent chronic changes or small subsegmental PEs.    Risk Factors: HTN, DM     Cath / PCI:   ?? None    CV Surgery:   ?? None    EP Procedures and Devices:   ?? None    Non-Invasive Evaluation(s):   ??  TTE 3/17: Normal EF, LVH, grade 1 diastolic dysfunction  ?? Ziopatch 2/16: Rare SVE and VE. 29 episodes of SVT, longest episode lasting 20 beats. No atrial fibrillation identified.  ?? TTE from 06/30/13: Normal EF 55-60%, mildly dilated RV, mild RV dysfunction.  ?? Nuclear stress test 07/2011:  ?? Normal myocardial perfusion study  ?? No evidence for significant ischemia or scar EF 62% and RV is mildly dilated.  ?? Minimal coronary calcification is noted involving the LAD and RCA    ______________________________________________________________________    Other past medical history, social history, family history, medications, allergies and problem list reviewed in the medical record.    Current cardiac medications include:   ?? Metoprolol 200 mg twice daily, changed to 200 mg in the morning and 100 mg in the evening  ?? Chlorthalidone 25 mg daily  ?? Furosemide 40 mg once daily  ?? Irbesartan 150 mg daily  ?? Warfarin  ?? Pravastatin      Objective:     BP 120/62 (BP Site: R Arm, BP Position: Sitting, BP Cuff Size: Large)  - Pulse 60  - Resp 16  - Ht 185.4 cm (6' 1)  - Wt (!) 121.6 kg (268 lb)  - SpO2 96%  - BMI 35.36 kg/m??     PHYSICAL EXAMINATION:   GENERAL: Alert, NAD  EYES: Sclerae clear, EOMI b/l  ENT: OP clear w/o exudate  NECK: Supple  CARDIOVASCULAR: RRR, normal S1/S2, no murmurs, rubs, or gallops.  RESPIRATORY: Clear to auscultation bilaterally. No wheezes, crackles, or rhonchi. Normal work of breathing.  ABDOMEN/GI: Soft, non-tender, non-distended with normoactive bowel sounds.  NEUROLOGIC: CN III-XII in tact, motor exam grossly non-focal.  SKIN: Small, scattered ecchymoses  PSYCH: Normal mental status, mood, and affect.  MSK: No effusions      ______________________________________________________________________  EKG 07/09/17 shows sinus bradycardia, left axis deviation, and poor R wave progression    Recent CV pertinent labs:  Lab Results   Component Value Date    LDL 36 (L) 11/27/2017    LDL 74 04/21/2015    NONHDL 86 11/27/2017    HDL 28 (L) 11/27/2017    HDL 36 (L) 04/21/2015    INR 2.57 12/24/2017    INR 3.8 (A) 09/25/2017    INR 1.3 07/08/2015    INR 2.80 12/10/2014    PROBNP 375 (H) 06/29/2014    CR 1.36 08/09/2014    CREATININE 1.53 (H) 12/24/2017    CREATININE 1.7 (H) 07/09/2017    K 4.4 12/24/2017    K 4.6 07/09/2017    BUN 54 (H) 12/24/2017    BUN 49.00 (H) 07/09/2017    TSH 2.202 07/02/2017    TSH 1.20 07/02/2011

## 2018-01-09 MED FILL — MYFORTIC/180MG/TAB: MYFORTIC/180MG/TAB | 30 days supply | Qty: 180 | Fill #1

## 2018-01-09 MED FILL — PROGRAF/1MG/CAP: PROGRAF/1MG/CAP | 30 days supply | Qty: 180 | Fill #1

## 2018-01-23 ENCOUNTER — Ambulatory Visit: Admit: 2018-01-23 | Discharge: 2018-01-23 | Payer: MEDICARE

## 2018-01-23 DIAGNOSIS — Z940 Kidney transplant status: Secondary | ICD-10-CM

## 2018-01-23 DIAGNOSIS — E1165 Type 2 diabetes mellitus with hyperglycemia: Secondary | ICD-10-CM

## 2018-01-23 DIAGNOSIS — E1129 Type 2 diabetes mellitus with other diabetic kidney complication: Principal | ICD-10-CM

## 2018-01-23 DIAGNOSIS — H4312 Vitreous hemorrhage, left eye: Principal | ICD-10-CM

## 2018-01-23 DIAGNOSIS — E113593 Type 2 diabetes mellitus with proliferative diabetic retinopathy without macular edema, bilateral: Secondary | ICD-10-CM

## 2018-01-23 DIAGNOSIS — Z961 Presence of intraocular lens: Secondary | ICD-10-CM

## 2018-01-23 DIAGNOSIS — H35373 Puckering of macula, bilateral: Secondary | ICD-10-CM

## 2018-01-23 DIAGNOSIS — I2699 Other pulmonary embolism without acute cor pulmonale: Secondary | ICD-10-CM

## 2018-01-23 LAB — LIPID PANEL
CHOLESTEROL/HDL RATIO SCREEN: 4.3 (ref <5.0)
CHOLESTEROL: 130 mg/dL (ref 100–199)
LDL CHOLESTEROL CALCULATED: 37 mg/dL — ABNORMAL LOW (ref 60–99)
NON-HDL CHOLESTEROL: 100 mg/dL
TRIGLYCERIDES: 314 mg/dL — ABNORMAL HIGH (ref 1–149)
VLDL CHOLESTEROL CAL: 62.8 mg/dL — ABNORMAL HIGH (ref 12–42)

## 2018-01-23 LAB — COMPREHENSIVE METABOLIC PANEL
ALBUMIN: 4 g/dL (ref 3.5–5.0)
ALKALINE PHOSPHATASE: 81 U/L (ref 38–126)
ALT (SGPT): 29 U/L (ref 19–72)
ANION GAP: 11 mmol/L (ref 9–15)
BILIRUBIN TOTAL: 0.9 mg/dL (ref 0.0–1.2)
BLOOD UREA NITROGEN: 39 mg/dL — ABNORMAL HIGH (ref 7–21)
BUN / CREAT RATIO: 25
CALCIUM: 9.5 mg/dL (ref 8.5–10.2)
CHLORIDE: 95 mmol/L — ABNORMAL LOW (ref 98–107)
CO2: 31 mmol/L — ABNORMAL HIGH (ref 22.0–30.0)
CREATININE: 1.56 mg/dL — ABNORMAL HIGH (ref 0.70–1.30)
EGFR CKD-EPI AA MALE: 51 mL/min/{1.73_m2} — ABNORMAL LOW (ref >=60)
EGFR CKD-EPI NON-AA MALE: 44 mL/min/{1.73_m2} — ABNORMAL LOW (ref >=60)
GLUCOSE RANDOM: 321 mg/dL — ABNORMAL HIGH (ref 65–179)
POTASSIUM: 4.7 mmol/L (ref 3.5–5.0)
PROTEIN TOTAL: 6.8 g/dL (ref 6.5–8.3)
SODIUM: 137 mmol/L (ref 135–145)

## 2018-01-23 LAB — HEMOGLOBIN A1C
HEMOGLOBIN A1C: 8.8 % — ABNORMAL HIGH (ref 4.8–5.6)
Hemoglobin A1c/Hemoglobin.total:MFr:Pt:Bld:Qn:: 8.8 — ABNORMAL HIGH

## 2018-01-23 LAB — CBC W/ AUTO DIFF
BASOPHILS ABSOLUTE COUNT: 0 10*9/L (ref 0.0–0.1)
BASOPHILS RELATIVE PERCENT: 0.4 %
EOSINOPHILS RELATIVE PERCENT: 1.2 %
HEMATOCRIT: 45.8 % (ref 41.0–53.0)
HEMOGLOBIN: 14.7 g/dL (ref 13.5–17.5)
LARGE UNSTAINED CELLS: 2 % (ref 0–4)
LYMPHOCYTES ABSOLUTE COUNT: 0.9 10*9/L — ABNORMAL LOW (ref 1.5–5.0)
LYMPHOCYTES RELATIVE PERCENT: 12.9 %
MEAN CORPUSCULAR HEMOGLOBIN CONC: 32.1 g/dL (ref 31.0–37.0)
MEAN CORPUSCULAR HEMOGLOBIN: 28.1 pg (ref 26.0–34.0)
MEAN CORPUSCULAR VOLUME: 87.5 fL (ref 80.0–100.0)
MEAN PLATELET VOLUME: 8.3 fL (ref 7.0–10.0)
MONOCYTES ABSOLUTE COUNT: 0.5 10*9/L (ref 0.2–0.8)
MONOCYTES RELATIVE PERCENT: 6.3 %
NEUTROPHILS ABSOLUTE COUNT: 5.6 10*9/L (ref 2.0–7.5)
NEUTROPHILS RELATIVE PERCENT: 77.6 %
PLATELET COUNT: 231 10*9/L (ref 150–440)
RED BLOOD CELL COUNT: 5.23 10*12/L (ref 4.50–5.90)
RED CELL DISTRIBUTION WIDTH: 14.2 % (ref 12.0–15.0)

## 2018-01-23 LAB — PLATELET COUNT: Lab: 231

## 2018-01-23 LAB — ALKALINE PHOSPHATASE: Alkaline phosphatase:CCnc:Pt:Ser/Plas:Qn:: 81

## 2018-01-23 LAB — PROTIME-INR: PROTIME: 16.1 s — ABNORMAL HIGH (ref 10.2–12.8)

## 2018-01-23 LAB — TACROLIMUS BLOOD: Lab: 6.2

## 2018-01-23 LAB — VLDL CHOLESTEROL CAL: Cholesterol.in VLDL:MCnc:Pt:Ser/Plas:Qn:Calculated: 62.8 — ABNORMAL HIGH

## 2018-01-23 LAB — PHOSPHORUS: Phosphate:MCnc:Pt:Ser/Plas:Qn:: 3.3

## 2018-01-23 LAB — PROTIME: Lab: 16.1 — ABNORMAL HIGH

## 2018-01-23 NOTE — Unmapped (Signed)
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  I reviewed the resident???s note.  I agree with the resident???s findings and plan including interpretation of images.

## 2018-01-24 MED ORDER — METOPROLOL SUCCINATE ER 200 MG TABLET,EXTENDED RELEASE 24 HR
3 refills | 0 days | Status: CP
Start: 2018-01-24 — End: 2018-11-07

## 2018-01-24 NOTE — Unmapped (Signed)
Incrase to 5/2.5 alternative days.  Repeat INR one week.    [name], M.D.     [facility] Internal [facility] at [facility]   [address]  [address]  [place], [place]  [address]  [phone]

## 2018-01-24 NOTE — Unmapped (Signed)
I am trying to update anticoagulation tracker and it is warning of a 12.5% decrease with the new dosing.  Could you please correct.

## 2018-01-24 NOTE — Unmapped (Signed)
Patient notified of correction and voiced understanding.

## 2018-01-24 NOTE — Unmapped (Signed)
CHIM patient

## 2018-01-24 NOTE — Unmapped (Signed)
Ok.  Increase to 2.5 mg MOn, and then 5 mg every other day.    Repeat INR one week.    [name], M.D.     [facility] Internal Medicine at [facility]   [address]  [address]  [place], [place]  [address]  [phone]

## 2018-01-24 NOTE — Unmapped (Signed)
Patient notified of coumadin dose change and will return next Thursday for recheck.

## 2018-01-24 NOTE — Unmapped (Signed)
-----   Message from Sharlee Blew, MD sent at 01/24/2018 11:13 AM EDT -----  Call and let patient know INR is low 1.4.  Please check on compliance and dosing.      [name], M.D.     [facility] Internal Medicine at [facility]   [address]  [address]  [place], [place]  [address]  [phone]

## 2018-01-24 NOTE — Unmapped (Signed)
Patient states that he takes 2.5 mg coumadin on Mondays and Thursdays, and 5 mg all other days, and that he has not missed any doses.    He did state that he's been eating a few more salads, but not dark green salads.  Please advise.

## 2018-01-30 ENCOUNTER — Ambulatory Visit: Admit: 2018-01-30 | Discharge: 2018-01-30 | Payer: MEDICARE

## 2018-01-30 ENCOUNTER — Ambulatory Visit: Admit: 2018-01-30 | Discharge: 2018-01-30 | Payer: MEDICARE | Attending: "Endocrinology | Primary: "Endocrinology

## 2018-01-30 DIAGNOSIS — I4891 Unspecified atrial fibrillation: Principal | ICD-10-CM

## 2018-01-30 DIAGNOSIS — E1165 Type 2 diabetes mellitus with hyperglycemia: Secondary | ICD-10-CM

## 2018-01-30 DIAGNOSIS — E1129 Type 2 diabetes mellitus with other diabetic kidney complication: Principal | ICD-10-CM

## 2018-01-30 MED ORDER — DULAGLUTIDE 1.5 MG/0.5 ML SUBCUTANEOUS PEN INJECTOR
INJECTION | SUBCUTANEOUS | 11 refills | 0.00000 days | Status: CP
Start: 2018-01-30 — End: 2018-04-24

## 2018-01-30 MED FILL — MYFORTIC 180 MG TABLET,DELAYED RELEASE: 30 days supply | Qty: 180 | Fill #0 | Status: AC

## 2018-01-30 MED FILL — PROGRAF 1 MG CAPSULE: 30 days supply | Qty: 180 | Fill #0 | Status: AC

## 2018-01-30 NOTE — Unmapped (Signed)
Confirmed.

## 2018-01-30 NOTE — Unmapped (Signed)
[facility] Refill and Clinical Coordination Note  Medication(s): MYFORTIC, PROGRAF    JORI THRALL, DOB: [birth year]  Phone: [phone] (home) [phone] (work), Alternate phone contact: N/A  Shipping address: [name]  [place] [address]  Phone or address changes today?: No  All above HIPAA information verified.  Insurance changes? No    Completed refill and clinical call assessment today to schedule patient's medication shipment from the [facility] [phone]).      MEDICATION RECONCILIATION    Confirmed the medication and dosage are correct and have not changed: Yes, regimen is correct and unchanged.    Were there any changes to your medication(s) in the past month:  No, there are no changes reported at this time.Nothing new since last fill    ADHERENCE    Is this medicine transplant or covered by Medicare Part B? Yes.    Myfortic 180 mg   Quantity filled last month: 180   # of tablets left on hand: 7 days left  Prograf 1 mg   Quantity filled last month: 180   # of tablets left on hand: 7 days left    Did you miss any doses in the past 4 weeks? No missed doses reported.  Adherence counseling provided? Not needed     SIDE EFFECT MANAGEMENT    Are you tolerating your medication?:  Hershy reports tolerating the medication.  Side effect management discussed: none.      Therapy is appropriate and should be continued.    Evidence of clinical benefit: See Epic note from 12/12/17      FINANCIAL/SHIPPING    Delivery Scheduled: Yes, Expected medication delivery date: 01/31/18 via ups     Additional medications refilled: No additional medications/refills needed at this time.    The patient will receive a drug information handout for each medication shipped and additional FDA Medication Guides as required.      Jarmon did not have any additional questions at this time.    Delivery address confirmed in Epic.     We will follow up with patient monthly for standard refill processing and delivery.      Thank you,  [name]   [facility] Specialty Pharmacist

## 2018-01-30 NOTE — Unmapped (Signed)
Freestyle [employer]. PP 1100 am . POCT glucose performed. 350 mg/dL.

## 2018-01-30 NOTE — Unmapped (Signed)
Patient came in today for POC INR visit which read 1.9. Patient is currently taking 2.5 mg on Mondays; 5 mg all other days. Denies any missed doses, diet changes, bleeding, or new medications.     Last INR was 1.4 on 01/23/2018. At that time we changed to above dosing.    Patient advised to continue current dose and recheck INR in 2 weeks per Dr Dellis Filbert.

## 2018-01-30 NOTE — Unmapped (Addendum)
1. Type II diabetes mellitus with renal manifestations, uncontrolled (CMS-HCC)  ?? Pleased with increased stability of glucoses since adding low dose Trulicity. Otherwise, no effects on weight/appetite, and no GI side effects.   ?? Increase to 1.5mg  weekly.  Let me know if any stomach discomfort.  If no concerns, maintain higher dose.  If any nausea that does not improve, we will return to 0.75mg  dose.   ?? If you start to notice more frequent glucoses < 90-100 with increase in Trulicity, let me know so we can start down titrating the insulin.   - dulaglutide (TRULICITY) 1.5 mg/0.5 mL PnIj; Inject 1.5 mg under the skin once a week.  Dispense: 4 Syringe; Refill: 11    Please record BP and HR at home.   If you again have SBP < 100, we need to consider further reduction in metoprolol.   I will communicate with Dr. Kirtland Bouchard that we had this discussion.

## 2018-01-30 NOTE — Unmapped (Addendum)
ASSESSMENT / PLAN  1. Type II diabetes mellitus with renal manifestations, uncontrolled (CMS-HCC)  2. Hypoglycemia associated with type 2 diabetes mellitus (CMS-HCC)  3. Class 2 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, unspecified BMI, unspecified obesity type, unspecified whether serious comorbidity present  ?? Pleased with increased stability of glucoses since adding low dose Trulicity. Otherwise, no effects on weight/appetite, and no GI side effects.   ?? Increase to 1.5mg  weekly.  Let me know if any stomach discomfort.  If no concerns, maintain higher dose.  If any nausea that does not improve, we will return to 0.75mg  dose.   ?? If you start to notice more frequent glucoses < 90-100 with increase in Trulicity, let me know so we can start down titrating the insulin.   - dulaglutide (TRULICITY) 1.5 mg/0.5 mL PnIj; Inject 1.5 mg under the skin once a week.  Dispense: 4 Syringe; Refill: 11    Please record BP and HR at home.   If you again have SBP < 100, we need to consider further reduction in metoprolol.   I will communicate with Dr. Kirtland Bouchard that we had this discussion.     MEDICARE:   *The patient requires a therapeutic CGM.   *The patient has diabetes mellitus and is insulin-treated with 3 or more daily injections of insulin(MDII).   *The patient has been using CGM and is checking glucose 4 or more times per day x 30 days.  *The patient???s insulin treatment regimen requires frequent adjustments by the patient on the basis of therapeutic CGM testing results.   *Within the last six months prior to ordering the CGM, I had an in-person visit with the beneficiary to evaluate their diabetes control and determine that the above criteria are met.   *Every six months following the initial prescription of the CGM, I will have an in-person visit with the beneficiary to assess adherence to their CGM regimen and diabetes treatment plan.      Greater than 50% of 25 min visit was spent face to face with patient on counseling and coordination of care regarding management of insulin dependent diabetes with multiple complications as detailed in note.    RTC 6 mo unless concerns sooner.    SUBJECTIVE  PCP: [name]  Tplant: Amy Mottle  Optho:  Herma Ard  Wound: Keagy  Podiatry: Dr. Ether Griffins, Gavin Potters     HPI: David Ellis is a 72 y.o. male who I last saw 12/2017 returns for follow up of T2DM with hx of kidney transplant.    Here with wife today.      He started 0.75mg  Trulicity last visit [place].  [$37 per mo vs prior attempts $400-600 per mo]  Since then he has also been trying to give u500 a full 15+ min before eating.  He and his wife do not feel he eats too much. Wt stable. No stomach pain.   He feels it has helped his glucoses.           (9-10am) Bfast DIAL UP TO 105 UNITS (21 CLICKS)   ????????????????????????Reduce to 70 units if premeal glucose 75-100 (14 clicks)  ????????????????????????Reduce 60 units if glucose <??75 AND you are getting ready to eat a full meal (12 clicks)  (2pm) Lunch DIAL UP TO 55 UNITS (11 CLICKS)  Try reducing to 6 clicks with PNButter sandwich.  (7ish) Dinner: DIAL UP TO 40 UNITS (8 CLICKS)  For seafood, reduce to 30units (6 clicks)  ????????????????????????+45 ????units for glucose 201- 300 (9 clicks)  ????????????????????????+50 ????  units 301- 400  (10 clicks)  If glucose <??100 at meal, reduce dose by 75%, and if glucose is <??75, reduce dose 50%.     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 S/p 2 operations June/Aug 2018 w Dr. Waunita Schooner.  FU 01/2018. He is aware of increase in blurry.   CKD followed by Nephrology.  Peripheral neuropathy w hx of amputation.  On Neurontin. Has DM shoes with no open wounds on feet.   BP lower than normal.  Occ dizzyness if he stands too quickly. Recently his PM dose of metoprolol was reduced in half by Cards.   No recent BP monitoring at home.   Lipids on low dose pravachol.    ASA only prn on chonic coumadin.    Endocrine Obtained / Updated Problem List:   1. T2DM dx 1992, insulin roughly 2000:   ?? Proliferative DR, Stable, Wynonia Sours MD, [facility]  ?? CKD s/p renal transplant  ?? 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 No vibratory 06/2017.   ?? Hypoglycemic emergency requiring EMS 03/2011.   ?? Considered Victoza in past, though he was resistant in setting of costs and already multiple daily injections. Trulicity 600$ per mo. Ozempic not covered by Medicare.  ?? Offered to check cpeptide to see if he would qualify for insulin pump under medcare, though he is not interested.    ?? Off MTF since end of 2015/early 2016.   2. 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   3. HTN   4. Nephrolithiasis   5. OSA on cpap   6. Aflutter s/p ablation 06/2011, atrial fibrillation  7. PE    Patient Active Problem List   Diagnosis   ??? Type II diabetes mellitus with renal manifestations, uncontrolled (CMS-HCC)   ??? Hypertension   ??? History of kidney transplant   ??? Mixed hyperlipidemia   ??? 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 Transplanted kidney   ??? Pulmonary embolism (CMS-HCC)   ??? Edema   ??? Right ventricular dysfunction   ??? Atrial fibrillation (CMS-HCC)   ??? Aftercare following organ transplant   ??? Diverticulosis of colon   ??? Routine general medical examination at a health care facility   ??? Diabetic polyneuropathy associated with type 2 diabetes mellitus (CMS-HCC)   ??? Toe amputation status (CMS-HCC)   ??? On statin therapy due to risk of future cardiovascular event   ??? Left knee pain   ??? BCC (basal cell carcinoma), face   ??? Acquired absence of other toe(s), unspecified side (CMS-HCC)   ??? History of colonic polyps   ??? 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 Hypoglycemia associated with type 2 diabetes mellitus (CMS-HCC)   ??? PAD (peripheral artery disease) (CMS-HCC)   ??? Class 2 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     Past Medical History:   Diagnosis Date   ??? Atrial flutter (CMS-HCC)    ??? Diabetes mellitus (CMS-HCC)    ??? Diabetic nephropathy (CMS-HCC)    ??? Diabetic retinopathy (CMS-HCC) ??? Fractures    ??? Ganglion cyst    ??? Hand injury    ??? Heart disease    ??? Hyperlipidemia    ??? Hypertension    ??? Joint pain    ??? Osteomyelitis (CMS-HCC) June 2016   ??? Pulmonary embolism (CMS-HCC) Dec 2015   ??? 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 Squamous cell  skin cancer June 2015   ??? Stroke (CMS-HCC)    ??? Tear of meniscus of knee    ??? Transplanted kidney 08/21/2011     Medicines:     Current Outpatient Medications   Medication Sig Dispense Refill   ??? aspirin (ECOTRIN) 81 MG tablet Take 81 mg by mouth continuous as needed. Only when flying and/or traveling by car more than 2 hours.     ??? BD INSULIN SYRINGE ULTRA-FINE 0.3 mL 31 gauge x 15/64 Syrg 1 each by Other route Three (3) times a day. 300 Syringe 3   ??? BD ULTRA-FINE NANO PEN NEEDLE 32 gauge x 5/32 Ndle USE UP TO 3 TIMES A DAY 300 each 3   ??? blood sugar diagnostic (FREESTYLE PRECISION NEO STRIPS) Strp Use to test blood sugar 4 times daily. Dx E11.65. 150 each 5   ??? blood-glucose meter kit Use as instructed. 1 each 0   ??? chlorthalidone (HYGROTON) 25 MG tablet TAKE ONE TABLET BY MOUTH EVERY DAY 60 tablet 5   ??? cholecalciferol, vitamin D3, (VITAMIN D3) 2,000 unit Tab Take 2,000 Units by mouth daily.      ??? dulaglutide (TRULICITY) 0.75 mg/0.5 mL injection pen Inject 0.5 mL (0.75 mg total) under the skin once a week. 12 Syringe 0   ??? furosemide (LASIX) 40 MG tablet TAKE 1 TO 2 TABLETS ONCE A DAY 180 tablet 3   ??? gabapentin (NEURONTIN) 300 MG capsule TAKE 1 CAPSULE TWICE DAILY 180 capsule 5   ??? HUMULIN R U-500, CONC, KWIKPEN 500 unit/mL (3 mL) CONCENTRATED injection DIAL TO MAX 100 UNITS BEFORE BREAKFAST, LUNCH, AND DINNER.  INJECT BEFOREMEALS MAX OF 300 UNITS DAILY 18 mL 11   ??? hydrocortisone 2.5 % cream   0   ??? irbesartan (AVAPRO) 150 MG tablet TAKE ONE TABLET AT BEDTIME 90 tablet 3   ??? ketoconazole (NIZORAL) 2 % cream   0   ??? lancets Misc 1 each by Miscellaneous route Four (4) times a day. (Patient not taking: Reported on 01/07/2018) 150 each 6   ??? magnesium chloride (SLOW_MAG) 64 mg TbEC Take 128 mg by mouth three (3) times a day (at 6am, noon and 6pm).      ??? metoprolol succinate (TOPROL-XL) 200 MG 24 hr tablet Take 200 mg in the morning and 100 mg in the evening 135 each 3   ??? multivitamin capsule Take 1 capsule by mouth daily.     ??? MYFORTIC 180 mg EC tablet TAKE 3 TABLETS (540 MG) BY MOUTH TWICE DAILY 180 tablet 35   ??? pravastatin (PRAVACHOL) 20 MG tablet Take 1 tablet (20 mg total) by mouth daily. 90 tablet 3   ??? PROGRAF 1 mg capsule TAKE 3 CAPSULES (3 MG) BY MOUTH TWICE DAILY 180 capsule 11   ??? warfarin (COUMADIN) 2.5 MG tablet 2 days a week  5   ??? warfarin (COUMADIN) 5 MG tablet Take 5 mg every day except mondays (Patient taking differently: 5 days a week) 30 tablet 6     No current facility-administered medications for this visit.      Facility-Administered Medications Ordered in Other Visits   Medication Dose Route Frequency Provider Last Rate Last Dose   ??? bevacizumab    PRN (once a day) Melvyn Novas, MD   2.5 mg at 01/28/17 0960       Social History: reviewed: his wife was kidney donor. rare etoh. no cig. 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     Other past medical  history, medications, allergies and problem list are reviewed in the medical record    REVIEW OF SYSTEMS: Pertinent positives and negatives as in HPI, otherwise, all other systems reviewed are negative.     OBJECTIVE -- my repeat BP 120/70  Vitals:    01/30/18 1108   BP: 98/43   Pulse: 73   Weight: (!) 121.2 kg (267 lb 4.8 oz)   Height: 185.4 cm (6' 0.99)     Body mass index is 35.27 kg/m??.    BP Readings from Last 3 Encounters:   01/30/18 98/43   01/07/18 120/62   12/31/17 126/59     Wt Readings from Last 3 Encounters:   01/30/18 (!) 121.2 kg (267 lb 4.8 oz)   01/07/18 (!) 121.6 kg (268 lb)   12/31/17 (!) 121.7 kg (268 lb 4.8 oz)     PSYCH: calm, cooperative with full affect  EYES: eomi, sclera anicteric  NECK: thyroid normal size, no nodules  CV: sinus rate, reg rhythm, neither DP nor PT palpable   LUNG: CTA throughout  LE: no edema    Pertinent labs:  Office Visit on 01/30/2018   Component Date Value Ref Range Status   ??? Glucose, POC 01/30/2018 350* 65 - 179 mg/dL Final   Appointment on 01/23/2018   Component Date Value Ref Range Status   ??? Sodium 01/23/2018 137  135 - 145 mmol/L Final   ??? Potassium 01/23/2018 4.7  3.5 - 5.0 mmol/L Final   ??? Chloride 01/23/2018 95* 98 - 107 mmol/L Final   ??? CO2 01/23/2018 31.0* 22.0 - 30.0 mmol/L Final   ??? BUN 01/23/2018 39* 7 - 21 mg/dL Final   ??? Creatinine 01/23/2018 1.56* 0.70 - 1.30 mg/dL Final   ??? BUN/Creatinine Ratio 01/23/2018 25   Final   ??? EGFR CKD-EPI Non-African American,* 01/23/2018 44* >=60 mL/min/1.57m2 Final   ??? EGFR CKD-EPI African American, Male 01/23/2018 51* >=60 mL/min/1.62m2 Final   ??? Glucose 01/23/2018 321* 65 - 179 mg/dL Final   ??? Calcium 81/19/1478 9.5  8.5 - 10.2 mg/dL Final   ??? Albumin 29/56/2130 4.0  3.5 - 5.0 g/dL Final   ??? Total Protein 01/23/2018 6.8  6.5 - 8.3 g/dL Final   ??? Total Bilirubin 01/23/2018 0.9  0.0 - 1.2 mg/dL Final   ??? AST 86/57/8469 27  19 - 55 U/L Final   ??? ALT 01/23/2018 29  19 - 72 U/L Final   ??? Alkaline Phosphatase 01/23/2018 81  38 - 126 U/L Final   ??? Anion Gap 01/23/2018 11  9 - 15 mmol/L Final   ??? Phosphorus 01/23/2018 3.3  2.9 - 4.7 mg/dL Final   ??? PT 62/95/2841 16.1* 10.2 - 12.8 sec Final   ??? INR 01/23/2018 1.41   Final   ??? Tacrolimus, Timed 01/23/2018 6.2  ng/mL Final   ??? Hemoglobin A1C 01/23/2018 8.8* 4.8 - 5.6 % Final   ??? Estimated Average Glucose 01/23/2018 206  mg/dL Final   ??? Triglycerides 01/23/2018 314* 1 - 149 mg/dL Final   ??? Cholesterol 01/23/2018 130  100 - 199 mg/dL Final   ??? HDL 32/44/0102 30* 40 - 59 mg/dL Final   ??? LDL Calculated 01/23/2018 37* 60 - 99 mg/dL Final      NHLBI Recommended Ranges, LDL Cholesterol, for Adults (20+yrs) (ATPIII), mg/dL  Optimal              <725  Near Optimal        100-129  Borderline High  130-159  High                160-189  Very High >=190  NHLBI Recommended Ranges, LDL Cholesterol, for Children (2-19 yrs), mg/dL  Desirable            <811  Borderline High     110-129  High                 >=130     ??? VLDL Cholesterol Cal 01/23/2018 62.8* 12 - 42 mg/dL Final   ??? Chol/HDL Ratio 01/23/2018 4.3  <9.1 Final   ??? Non-HDL Cholesterol 01/23/2018 100  mg/dL Final      Non-HDL Cholesterol Recommended Ranges (mg/dL)  Optimal       <478  Near Optimal 130 - 159  Borderline High 160 - 189  High             190 - 219  Very High       >220     ??? FASTING 01/23/2018 No   Final   ??? WBC 01/23/2018 7.3  4.5 - 11.0 10*9/L Final   ??? RBC 01/23/2018 5.23  4.50 - 5.90 10*12/L Final   ??? HGB 01/23/2018 14.7  13.5 - 17.5 g/dL Final   ??? HCT 29/56/2130 45.8  41.0 - 53.0 % Final   ??? MCV 01/23/2018 87.5  80.0 - 100.0 fL Final   ??? MCH 01/23/2018 28.1  26.0 - 34.0 pg Final   ??? MCHC 01/23/2018 32.1  31.0 - 37.0 g/dL Final   ??? RDW 86/57/8469 14.2  12.0 - 15.0 % Final   ??? MPV 01/23/2018 8.3  7.0 - 10.0 fL Final   ??? Platelet 01/23/2018 231  150 - 440 10*9/L Final   ??? Neutrophils % 01/23/2018 77.6  % Final   ??? Lymphocytes % 01/23/2018 12.9  % Final   ??? Monocytes % 01/23/2018 6.3  % Final   ??? Eosinophils % 01/23/2018 1.2  % Final   ??? Basophils % 01/23/2018 0.4  % Final   ??? Absolute Neutrophils 01/23/2018 5.6  2.0 - 7.5 10*9/L Final   ??? Absolute Lymphocytes 01/23/2018 0.9* 1.5 - 5.0 10*9/L Final   ??? Absolute Monocytes 01/23/2018 0.5  0.2 - 0.8 10*9/L Final   ??? Absolute Eosinophils 01/23/2018 0.1  0.0 - 0.4 10*9/L Final   ??? Absolute Basophils 01/23/2018 0.0  0.0 - 0.1 10*9/L Final   ??? Large Unstained Cells 01/23/2018 2  0 - 4 % Final   Office Visit on 12/31/2017   Component Date Value Ref Range Status   ??? Glucose, POC 12/31/2017 210* 65 - 179 mg/dL Final     Lab Results   Component Value Date    A1C 8.8 (H) 01/23/2018    A1C 9.4 (H) 11/27/2017    A1C 8.9 (H) 10/29/2017    A1C 9.1 (H) 09/30/2017    A1C 8.0 (H) 10/03/2015    A1C 6.7 (H) 08/04/2014    A1C 7.5 07/20/2014    A1C 7.1 (H) 07/04/2014    A1C 7.6 (H) 05/17/2014    A1C 8.0 (H) 11/23/2013    A1C 6.7 (H) 04/07/2012    A1C 7.4 (H) 03/30/2011     No components found for: SODIUM  Lab Results   Component Value Date    K 4.7 01/23/2018     Lab Results   Component Value Date    CREATININE 1.56 (H) 01/23/2018     Lab Results   Component Value Date  PTH 192.3 (H) 11/27/2017    CALCIUM 9.5 01/23/2018     Lab Results   Component Value Date    ALT 29 01/23/2018    ALT 22 04/21/2015     Lab Results   Component Value Date    CHOL 130 01/23/2018     Lab Results   Component Value Date    LDL 37 (L) 01/23/2018     Lab Results   Component Value Date    HDL 30 (L) 01/23/2018     Lab Results   Component Value Date    TRIG 314 (H) 01/23/2018     Lab Results   Component Value Date    TSH 2.202 07/02/2017    TSH 1.840 07/13/2015    TSH 1.420 07/09/2015     Lab Results   Component Value Date    TSH 2.202 07/02/2017     No results found for: CBC  No results found for: VITAMINB12  Lab Results   Component Value Date    VITD2 <5 08/17/2015    VITD3 42 08/17/2015    VITDTOTAL 33.8 03/01/2017     No results found for: Janeece Fitting, MALBCRERAT  Lab Results   Component Value Date    PROTEINUR <4.0 08/27/2017    PROTEINUR 15 08/05/2014    PCRATIOUR  08/27/2017      Comment:        Unable to calculate due to value below lower limit of assay linearity.    PCRATIOUR 0.116 08/05/2014       IMAGING:

## 2018-02-06 NOTE — Unmapped (Signed)
Patient contact did not originate through upfront.    Outreached to patient. Patient declined scheduling of Annual Wellness Visit (AWV).    Outreach flow sheet completed: Yes.

## 2018-02-10 NOTE — Unmapped (Signed)
Hi Lauren,      Mr. Boley called today needing a letter another letter composed to give to the airlines for his flight out of the country to [place] and some other countries for his 50th wedding anniversary. Please see the letter dated 04/06/14 from [place] for an example.The patient needs this letter before the Oct 4,2019.     The letter needs to be addressed to [employer]    Please contact him at [phone].       Regards,    Marcy Panning

## 2018-02-11 ENCOUNTER — Ambulatory Visit: Admit: 2018-02-11 | Discharge: 2018-02-12 | Payer: MEDICARE

## 2018-02-11 DIAGNOSIS — I4891 Unspecified atrial fibrillation: Principal | ICD-10-CM

## 2018-02-11 NOTE — Unmapped (Signed)
COnfirmed.  David Ellis, M.D.     [facility] Internal Medicine at [facility]   [address]  [address]  [place], [place]  [address]  [phone]

## 2018-02-11 NOTE — Unmapped (Signed)
Patient came in today for POC INR visit which read 3.0. Patient is currently taking 2.5 mg on Mondays; 5 mg all other days. Denies any missed doses, diet changes, bleeding, or new medications.     Last INR was 1.9 on 01/30/2018. At that time we did not change to above dosing.    Patient advised to continue current dose and recheck INR in 2 weeks per standing order.

## 2018-02-21 NOTE — Unmapped (Signed)
Patient is doing well at this time  They will be out of town the weekend of the first week of October for 2 weeks  Sending out 10/1 so he will have meds on hand for the trip    [facility] Refill Coordination Note    Specialty Medication(s) to be Shipped:   Transplant: Myfortic 180mg  and Prograf 1mg     Other medication(s) to be shipped: n/a     David Ellis, DOB: Sep 02, 1945  Phone: [phone] (home) [phone] (work)  Shipping Address: [name]  [place] [address]    All above HIPAA information was verified with patient's wife     Completed refill call assessment today to schedule patient's medication shipment from the [facility] [phone]).       Specialty medication(s) and dose(s) confirmed: Regimen is correct and unchanged.   Changes to medications: David Ellis reports no changes reported at this time.  Changes to insurance: No  Questions for the pharmacist: No    The patient will receive a drug information handout for each medication shipped and additional FDA Medication Guides as required.      DISEASE/MEDICATION-SPECIFIC INFORMATION        N/A    ADHERENCE        He hasn't missed any doses that they're aware of      MEDICARE PART B DOCUMENTATION     Myfortic 180mg : Patient has 13 days of tablets on hand.  Prograf 1mg : Patient has 13 days of capsules on hand.    SHIPPING     Shipping address confirmed in Epic.     Delivery Scheduled: Yes, Expected medication delivery date: 10/1 via UPS or courier.     David Ellis   [facility] Specialty Technician

## 2018-02-24 ENCOUNTER — Ambulatory Visit: Admit: 2018-02-24 | Discharge: 2018-02-25 | Payer: MEDICARE

## 2018-02-24 DIAGNOSIS — I2699 Other pulmonary embolism without acute cor pulmonale: Secondary | ICD-10-CM

## 2018-02-24 DIAGNOSIS — Z940 Kidney transplant status: Secondary | ICD-10-CM

## 2018-02-24 DIAGNOSIS — E1165 Type 2 diabetes mellitus with hyperglycemia: Secondary | ICD-10-CM

## 2018-02-24 DIAGNOSIS — E1129 Type 2 diabetes mellitus with other diabetic kidney complication: Principal | ICD-10-CM

## 2018-02-24 LAB — CBC W/ AUTO DIFF
BASOPHILS ABSOLUTE COUNT: 0 10*9/L (ref 0.0–0.1)
BASOPHILS RELATIVE PERCENT: 0.2 %
EOSINOPHILS ABSOLUTE COUNT: 0.1 10*9/L (ref 0.0–0.4)
EOSINOPHILS RELATIVE PERCENT: 1 %
HEMATOCRIT: 46.6 % (ref 41.0–53.0)
HEMOGLOBIN: 14.9 g/dL (ref 13.5–17.5)
LARGE UNSTAINED CELLS: 2 % (ref 0–4)
LYMPHOCYTES ABSOLUTE COUNT: 1 10*9/L — ABNORMAL LOW (ref 1.5–5.0)
LYMPHOCYTES RELATIVE PERCENT: 13.2 %
MEAN CORPUSCULAR HEMOGLOBIN CONC: 32 g/dL (ref 31.0–37.0)
MEAN CORPUSCULAR HEMOGLOBIN: 28.2 pg (ref 26.0–34.0)
MEAN CORPUSCULAR VOLUME: 88.2 fL (ref 80.0–100.0)
MONOCYTES ABSOLUTE COUNT: 0.4 10*9/L (ref 0.2–0.8)
NEUTROPHILS ABSOLUTE COUNT: 5.8 10*9/L (ref 2.0–7.5)
NEUTROPHILS RELATIVE PERCENT: 78.1 %
PLATELET COUNT: 201 10*9/L (ref 150–440)
RED BLOOD CELL COUNT: 5.29 10*12/L (ref 4.50–5.90)
RED CELL DISTRIBUTION WIDTH: 14 % (ref 12.0–15.0)
WBC ADJUSTED: 7.4 10*9/L (ref 4.5–11.0)

## 2018-02-24 LAB — INR: Lab: 2.58

## 2018-02-24 LAB — COMPREHENSIVE METABOLIC PANEL
ALBUMIN: 3.9 g/dL (ref 3.5–5.0)
ALKALINE PHOSPHATASE: 77 U/L (ref 38–126)
ALT (SGPT): 34 U/L (ref 19–72)
ANION GAP: 10 mmol/L (ref 9–15)
AST (SGOT): 28 U/L (ref 19–55)
BILIRUBIN TOTAL: 0.7 mg/dL (ref 0.0–1.2)
BLOOD UREA NITROGEN: 37 mg/dL — ABNORMAL HIGH (ref 7–21)
BUN / CREAT RATIO: 27
CALCIUM: 9 mg/dL (ref 8.5–10.2)
CHLORIDE: 98 mmol/L (ref 98–107)
CO2: 29 mmol/L (ref 22.0–30.0)
CREATININE: 1.39 mg/dL — ABNORMAL HIGH (ref 0.70–1.30)
EGFR CKD-EPI AA MALE: 59 mL/min/{1.73_m2} — ABNORMAL LOW (ref >=60)
EGFR CKD-EPI NON-AA MALE: 51 mL/min/{1.73_m2} — ABNORMAL LOW (ref >=60)
GLUCOSE RANDOM: 324 mg/dL — ABNORMAL HIGH (ref 65–179)
PROTEIN TOTAL: 6.8 g/dL (ref 6.5–8.3)
SODIUM: 137 mmol/L (ref 135–145)

## 2018-02-24 LAB — ANION GAP: Anion gap 3:SCnc:Pt:Ser/Plas:Qn:: 10

## 2018-02-24 LAB — MONOCYTES ABSOLUTE COUNT: Lab: 0.4

## 2018-02-24 LAB — PHOSPHORUS: Phosphate:MCnc:Pt:Ser/Plas:Qn:: 2.4 — ABNORMAL LOW

## 2018-02-24 LAB — TACROLIMUS BLOOD: Lab: 6.5

## 2018-02-26 MED ORDER — DULAGLUTIDE 0.75 MG/0.5 ML SUBCUTANEOUS PEN INJECTOR
INJECTION | SUBCUTANEOUS | 11 refills | 0 days | Status: CP
Start: 2018-02-26 — End: 2019-02-26

## 2018-02-26 NOTE — Unmapped (Signed)
Pt contacted me with concerns of some changes in BM after increasing to 1.5mg  dose.   With upcoming trip overseas, he prefers to try reducing to 0.75mg  dose to confirm whether symptoms resolve on lower dose.

## 2018-02-27 NOTE — Unmapped (Signed)
-----   Message from Sharlee Blew, MD sent at 02/27/2018 12:43 PM EDT -----  Call and let patien tknow INR ok.  Same dose; repeat INR one month.    [name], M.D.     [facility] Internal Medicine at [facility]   [address]  [address]  [place], [place]  [address]  [phone]

## 2018-02-27 NOTE — Unmapped (Signed)
Message relayed to patient, patient verbalized understanding. No questions at this time. Anticoag tracker updated.

## 2018-03-03 MED FILL — PROGRAF 1 MG CAPSULE: 30 days supply | Qty: 180 | Fill #1 | Status: AC

## 2018-03-03 MED FILL — PROGRAF 1 MG CAPSULE: 30 days supply | Qty: 180 | Fill #1

## 2018-03-03 MED FILL — MYFORTIC 180 MG TABLET,DELAYED RELEASE: 30 days supply | Qty: 180 | Fill #1 | Status: AC

## 2018-03-03 MED FILL — MYFORTIC 180 MG TABLET,DELAYED RELEASE: 30 days supply | Qty: 180 | Fill #1

## 2018-03-24 NOTE — Unmapped (Signed)
[facility] Refill Coordination Note    Specialty Medication(s) to be Shipped:   Transplant: Myfortic 180mg  and Prograf 1mg     Other medication(s) to be shipped: none     David Ellis, DOB: 1946/01/22  Phone: [phone] (home) [phone] (work)      All above HIPAA information was verified with patient.     Completed refill call assessment today to schedule patient's medication shipment from the [facility] [phone]).       Specialty medication(s) and dose(s) confirmed: Regimen is correct and unchanged.   Changes to medications: David Ellis reports no changes reported at this time.  Changes to insurance: No  Questions for the pharmacist: No    The patient will receive a drug information handout for each medication shipped and additional FDA Medication Guides as required.      DISEASE/MEDICATION-SPECIFIC INFORMATION        N/A    ADHERENCE     Medication Adherence    Patient reported X missed doses in the last month:  0                          MEDICARE PART B DOCUMENTATION     Myfortic 180mg : Patient has 10 days worth of tablets on hand.  Prograf 1mg : Patient has 10 days worth of capsules on hand.    SHIPPING     Shipping address confirmed in Epic.     Delivery Scheduled: Yes, Expected medication delivery date: 03/28/18 via UPS or courier.     Medication will be delivered via UPS to the home address in Epic WAM.    David Ellis   [facility] Specialty Technician

## 2018-03-25 MED ORDER — PRAVASTATIN 20 MG TABLET
3 refills | 0 days | Status: CP
Start: 2018-03-25 — End: ?

## 2018-03-25 NOTE — Unmapped (Signed)
PCP:  [name], MD      ASSESSMENT/PLAN: [name] is a 72 y.o. patient s/p LURD kidney transplant 2013 with postoperative complications of a massive PE in 2014, osteomyelitis requiring lateral foot amputation in 2016.    1. S/P LURD kidney transplant 2013 with baseline creatinine ~1.4-1.7. Renal function elevated above baseline, 2.1 today. Tacrolimus trough pending. His electrolytes are WNL. Urine dipstick negative for proteinuria and most recent UACR was within normal range. Will recheck creatinine on Monday; if no improvement we will likely need to obtain repeat renal biopsy next week. Has had 3 prior biopsies all in 2013 revealing moderate to severe arteriosclerosis. The patient has a history of a DQ 7 donor specific antibody noted on 09/17/2011 with all subsequent HLA antibody screens revealing no DSA's. Continue current dose of mycophenolate and tacrolimus (goal trough of 4-7). Tac troughs have been within range 5.7-7.4 since March 2019. Had urine cytology this year which were negative for BK but resent today.    3. HTN. BP low today (102/66) but likely an aberration, particularly since he is asymptomatic and it's been running in the 130s/80s at home. Asked him to continue to monitor. On decreased metoprolol 200mg /100mg  BID.    4. T2DM. Uncontrolled. Very insulin resistant and has had to be more sedentary over last year or two due to foot wound. Follows with Dr. Tiburcio Pea in endocrinology. Goal HbA1c < 8%, last 8.8 on 01/23/18. Now on Trulicity. History of proliferative retinopathy s/p laser treatments, last evaluated by ophthalmology in 06/2017. Discussed possibly carb counting in order to get ahead of his blood sugars; I will follow up with Dr. Tiburcio Pea on plan to improve glycemic control.    5. CVD risk. Continue pravastatin.     6. Need for chronic anticoagulation. Completed treatment for PE but continues on coumadin for paroxysmal AFib. We'd discussed transition to eliquis but given lack of reversal agent, it was decided to continue the warfarin since he's been very stable on it. INR managed by Dr. Dellis Filbert.    7. HM: Had his flu shot and UTD on pneumonia and shinrix. Due for colonoscopy but reportedly GI is leary of stopping anticoagulation so are following cologuard test results. No FH colon CA. Continue biannual follow-up with dermatology re: recurrent basal cell CA.    8. High falls risk: Recent fall while on vacation but did not have his walker with him.     [name] will return to see me in 3 mos, or sooner as indicated, pending improvement in creatinine.    ______________________________________________________________________    HPI: [name] is a 72 y.o. man s/p LURD kidney transplant (from his wife, [name]) 08/21/2011 and B/L creatinine ~1.4-1.7 who returns for f/u. 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 He has significant PAD and neuropathy and has no sensation on either leg below his knees.    Since last seen, patient has had no hospitalizations or ED visits. Continues to follow with internal medicine, endocrinology, cardiology.     He suffered a fall in the bathroom about 2 weeks ago, while on their trip to [place]. Now with a large healing contusion on his back. He was in a lot of pain after this and  was laid up in bed the entire trip-he and his wife just returned on 10/19. His blood sugars were excessively uncontrolled (400s) during the trip. Today, he continues to report rib pain from his fall. He is still recovering from this trip; both he and [name] are very tired and seem depressed - [name] states that she is not taking him overseas ever again. Otherwise no acute complaints of CP, SOB, cough, palpitations, N/V/D. His edema is pretty much gone he says.    ROS: As per HPI. The remainder of the 10 system review is negative.    PAST MEDICAL HISTORY:  Past Medical History:   Diagnosis Date   ??? Atrial flutter (CMS-HCC)    ??? Diabetes mellitus (CMS-HCC)    ??? Diabetic nephropathy (CMS-HCC)    ??? Diabetic retinopathy (CMS-HCC)    ??? Fractures    ??? Ganglion cyst    ??? Hand injury    ??? Heart disease    ??? Hyperlipidemia    ??? Hypertension    ??? Joint pain    ??? Osteomyelitis (CMS-HCC) June 2016   ??? Pulmonary embolism (CMS-HCC) Dec 2015   ??? 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 Stroke (CMS-HCC)    ??? Tear of meniscus of knee    ??? Transplanted kidney 08/21/2011       ALLERGIES  Levofloxacin; Lidocaine; Penicillins; Egg derived; Enalapril; Epinephrine; Grass pollen-bermuda, standard; Lisinopril; and Mepivacaine hcl    MEDICATIONS:  Current Outpatient Medications   Medication Sig Dispense Refill   ??? chlorthalidone (HYGROTON) 25 MG tablet TAKE ONE TABLET BY MOUTH EVERY DAY 60 tablet 5   ??? cholecalciferol, vitamin D3, (VITAMIN D3) 2,000 unit Tab Take 2,000 Units by mouth daily.      ??? dulaglutide (TRULICITY) 0.75 mg/0.5 mL injection pen Inject 0.5 mL (0.75 mg total) under the skin once a week. 4 Syringe 11   ??? furosemide (LASIX) 40 MG tablet TAKE 1 TO 2 TABLETS ONCE A DAY 180 tablet 3   ??? gabapentin (NEURONTIN) 300 MG capsule TAKE 1 CAPSULE TWICE DAILY 180 capsule 5   ??? HUMULIN R U-500, CONC, KWIKPEN 500 unit/mL (3 mL) CONCENTRATED injection DIAL TO MAX 100 UNITS BEFORE BREAKFAST, LUNCH, AND DINNER.  INJECT BEFOREMEALS MAX OF 300 UNITS DAILY 18 mL 11   ??? irbesartan (AVAPRO) 150 MG tablet TAKE ONE TABLET AT BEDTIME 90 tablet 3   ??? magnesium chloride (SLOW_MAG) 64 mg TbEC Take 128 mg by mouth three (3) times a day (at 6am, noon and 6pm).      ??? metoprolol succinate (TOPROL-XL) 200 MG 24 hr tablet Take 200 mg in the morning and 100 mg in the evening 135 each 3   ??? multivitamin capsule Take 1 capsule by mouth daily.     ??? MYFORTIC 180 mg EC tablet TAKE 3 TABLETS (540 MG) BY MOUTH TWICE DAILY 180 tablet 35   ??? pravastatin (PRAVACHOL) 20 MG tablet TAKE ONE TABLET BY MOUTH EVERY DAY 90 each 3   ??? PROGRAF 1 mg capsule TAKE 3 CAPSULES (3 MG) BY MOUTH TWICE DAILY 180 capsule 11   ??? warfarin (COUMADIN) 2.5 MG tablet 2.5 mg. mondays  5   ??? warfarin (COUMADIN) 5 MG tablet Take 5 mg every day except mondays (Patient taking differently: 5 days a week) 30 tablet 6   ??? aspirin (ECOTRIN) 81 MG tablet Take 81 mg by mouth continuous as needed. Only when  flying and/or traveling by car more than 2 hours.     ??? BD INSULIN SYRINGE ULTRA-FINE 0.3 mL 31 gauge x 15/64 Syrg 1 each by Other route Three (3) times a day. 300 Syringe 3   ??? BD ULTRA-FINE NANO PEN NEEDLE 32 gauge x 5/32 Ndle USE UP TO 3 TIMES A DAY 300 each 3   ??? blood sugar diagnostic (FREESTYLE PRECISION NEO STRIPS) Strp Use to test blood sugar 4 times daily. Dx E11.65. 150 each 5   ??? blood-glucose meter kit Use as instructed. 1 each 0   ??? dulaglutide (TRULICITY) 1.5 mg/0.5 mL PnIj Inject 1.5 mg under the skin once a week. (Patient not taking: Reported on 03/27/2018) 4 Syringe 11   ??? hydrocortisone 2.5 % cream   0   ??? ketoconazole (NIZORAL) 2 % cream   0   ??? lancets Misc 1 each by Miscellaneous route Four (4) times a day. (Patient not taking: Reported on 01/07/2018) 150 each 6     No current facility-administered medications for this visit.      Facility-Administered Medications Ordered in Other Visits   Medication Dose Route Frequency Provider Last Rate Last Dose   ??? bevacizumab    PRN (once a day) Melvyn Novas, MD   2.5 mg at 01/28/17 0750       PHYSICAL EXAM:  BP 102/66 (BP Site: L Arm, BP Position: Sitting, BP Cuff Size: Medium)  - Pulse 80  - Temp 36.6 ??C (97.9 ??F) (Temporal)  - Ht 185.4 cm (6' 0.99)  - Wt (!) 117.9 kg (260 lb)  - BMI 34.31 kg/m??    CONSTITUTIONAL: Alert, well appearing, no distress  CARDIOVASCULAR: Regular, normal S1/S2 heart sounds  PULM: Clear to auscultation bilaterally  MUSCULOSKELETAL: Large contusion on his back, now healing well.  EXTREMITIES: No B/L LE edema (L>R chronic)  NEUROLOGIC: Non-focal    MEDICAL DECISION MAKING    Results for orders placed or performed in visit on 03/27/18   Comprehensive Metabolic Panel   Result Value Ref Range    Sodium 135 135 - 145 mmol/L    Potassium 4.8 3.5 - 5.0 mmol/L    Chloride 92 (L) 98 - 107 mmol/L    CO2 32.0 (H) 22.0 - 30.0 mmol/L    BUN 56 (H) 7 - 21 mg/dL    Creatinine 6.44 (H) 0.70 - 1.30 mg/dL    BUN/Creatinine Ratio 27     EGFR CKD-EPI Non-African American, Male 31 (L) >=60 mL/min/1.60m2    EGFR CKD-EPI African American, Male 36 (L) >=60 mL/min/1.85m2    Glucose 346 (H) 65 - 179 mg/dL    Calcium 9.9 8.5 - 03.4 mg/dL    Albumin 4.0 3.5 - 5.0 g/dL    Total Protein 7.0 6.5 - 8.3 g/dL    Total Bilirubin 0.9 0.0 - 1.2 mg/dL    AST 27 19 - 55 U/L    ALT 28 19 - 72 U/L    Alkaline Phosphatase 98 38 - 126 U/L    Anion Gap 11 7 - 15 mmol/L   Phosphorus Level   Result Value Ref Range    Phosphorus 3.0 2.9 - 4.7 mg/dL   CBC w/ Differential   Result Value Ref Range    WBC 8.2 4.5 - 11.0 10*9/L    RBC 5.46 4.50 - 5.90 10*12/L    HGB 15.6 13.5 - 17.5 g/dL    HCT 74.2 59.5 - 63.8 %    MCV 87.5 80.0 - 100.0  fL    MCH 28.5 26.0 - 34.0 pg    MCHC 32.5 31.0 - 37.0 g/dL    RDW 27.0 35.0 - 09.3 %    MPV 8.0 7.0 - 10.0 fL    Platelet 247 150 - 440 10*9/L    Neutrophils % 80.6 %    Lymphocytes % 11.2 %    Monocytes % 4.8 %    Eosinophils % 0.8 %    Basophils % 1.0 %    Absolute Neutrophils 6.6 2.0 - 7.5 10*9/L    Absolute Lymphocytes 0.9 (L) 1.5 - 5.0 10*9/L    Absolute Monocytes 0.4 0.2 - 0.8 10*9/L    Absolute Eosinophils 0.1 0.0 - 0.4 10*9/L    Absolute Basophils 0.1 0.0 - 0.1 10*9/L    Large Unstained Cells 2 0 - 4 %    Hypochromasia Slight (A) Not Present     *Note: Due to a large number of results and/or encounters for the requested time period, some results have not been displayed. A complete set of results can be found in Results Review.        Lab Results   Component Value Date    NA 135 03/27/2018    K 4.8 03/27/2018    CL 92 (L) 03/27/2018    CO2 32.0 (H) 03/27/2018    BUN 56 (H) 03/27/2018    CREATININE 2.11 (H) 03/27/2018    GFRAA 51 (L) 10/29/2017    GFRNONAA 42 (L) 10/29/2017    ALBUMIN 4.0 03/27/2018     Lab Results   Component Value Date    WBC 8.2 03/27/2018    HGB 15.6 03/27/2018    HCT 47.8 03/27/2018    PLT 247 03/27/2018     Lab Results   Component Value Date    PTH 192.3 (H) 11/27/2017    CALCIUM 9.9 03/27/2018    PHOS 3.0 03/27/2018       Scribe's Attestation: Trey Sailors, MD obtained and performed the history, physical exam and medical decision making elements that were entered into the chart.  Signed by Gaye Alken, Scribe, on March 27, 2018 at 11:12 AM.    Attending Statement: Documentation assistance provided by the Scribe. I was present during the time the encounter was recorded. The information recorded by the Scribe was done at my direction and has been reviewed and validated by me.

## 2018-03-27 ENCOUNTER — Ambulatory Visit: Admit: 2018-03-27 | Discharge: 2018-03-28 | Payer: MEDICARE

## 2018-03-27 DIAGNOSIS — E1165 Type 2 diabetes mellitus with hyperglycemia: Secondary | ICD-10-CM

## 2018-03-27 DIAGNOSIS — Z940 Kidney transplant status: Secondary | ICD-10-CM

## 2018-03-27 DIAGNOSIS — I2699 Other pulmonary embolism without acute cor pulmonale: Secondary | ICD-10-CM

## 2018-03-27 DIAGNOSIS — I10 Essential (primary) hypertension: Secondary | ICD-10-CM

## 2018-03-27 DIAGNOSIS — Z794 Long term (current) use of insulin: Secondary | ICD-10-CM

## 2018-03-27 DIAGNOSIS — E119 Type 2 diabetes mellitus without complications: Principal | ICD-10-CM

## 2018-03-27 DIAGNOSIS — E1129 Type 2 diabetes mellitus with other diabetic kidney complication: Principal | ICD-10-CM

## 2018-03-27 LAB — CBC W/ AUTO DIFF
BASOPHILS RELATIVE PERCENT: 1 %
EOSINOPHILS ABSOLUTE COUNT: 0.1 10*9/L (ref 0.0–0.4)
HEMATOCRIT: 47.8 % (ref 41.0–53.0)
HEMOGLOBIN: 15.6 g/dL (ref 13.5–17.5)
LARGE UNSTAINED CELLS: 2 % (ref 0–4)
LYMPHOCYTES ABSOLUTE COUNT: 0.9 10*9/L — ABNORMAL LOW (ref 1.5–5.0)
LYMPHOCYTES RELATIVE PERCENT: 11.2 %
MEAN CORPUSCULAR HEMOGLOBIN CONC: 32.5 g/dL (ref 31.0–37.0)
MEAN CORPUSCULAR HEMOGLOBIN: 28.5 pg (ref 26.0–34.0)
MEAN PLATELET VOLUME: 8 fL (ref 7.0–10.0)
MONOCYTES ABSOLUTE COUNT: 0.4 10*9/L (ref 0.2–0.8)
MONOCYTES RELATIVE PERCENT: 4.8 %
NEUTROPHILS ABSOLUTE COUNT: 6.6 10*9/L (ref 2.0–7.5)
NEUTROPHILS RELATIVE PERCENT: 80.6 %
PLATELET COUNT: 247 10*9/L (ref 150–440)
RED BLOOD CELL COUNT: 5.46 10*12/L (ref 4.50–5.90)
RED CELL DISTRIBUTION WIDTH: 14 % (ref 12.0–15.0)
WBC ADJUSTED: 8.2 10*9/L (ref 4.5–11.0)

## 2018-03-27 LAB — CREATININE, URINE: Lab: 139

## 2018-03-27 LAB — COMPREHENSIVE METABOLIC PANEL
ALBUMIN: 4 g/dL (ref 3.5–5.0)
ALKALINE PHOSPHATASE: 98 U/L (ref 38–126)
ALT (SGPT): 28 U/L (ref 19–72)
ANION GAP: 11 mmol/L (ref 7–15)
AST (SGOT): 27 U/L (ref 19–55)
BILIRUBIN TOTAL: 0.9 mg/dL (ref 0.0–1.2)
BLOOD UREA NITROGEN: 56 mg/dL — ABNORMAL HIGH (ref 7–21)
BUN / CREAT RATIO: 27
CALCIUM: 9.9 mg/dL (ref 8.5–10.2)
CHLORIDE: 92 mmol/L — ABNORMAL LOW (ref 98–107)
CO2: 32 mmol/L — ABNORMAL HIGH (ref 22.0–30.0)
CREATININE: 2.11 mg/dL — ABNORMAL HIGH (ref 0.70–1.30)
EGFR CKD-EPI AA MALE: 36 mL/min/{1.73_m2} — ABNORMAL LOW (ref >=60)
GLUCOSE RANDOM: 346 mg/dL — ABNORMAL HIGH (ref 65–179)
PROTEIN TOTAL: 7 g/dL (ref 6.5–8.3)

## 2018-03-27 LAB — PROTEIN / CREATININE RATIO, URINE
CREATININE, URINE: 139 mg/dL
PROTEIN/CREAT RATIO, URINE: 0.037

## 2018-03-27 LAB — TACROLIMUS BLOOD: Lab: 7.5

## 2018-03-27 LAB — MEAN CORPUSCULAR HEMOGLOBIN: Lab: 28.5

## 2018-03-27 LAB — INR: Lab: 3.08

## 2018-03-27 LAB — CALCIUM: Calcium:MCnc:Pt:Ser/Plas:Qn:: 9.9

## 2018-03-27 LAB — PHOSPHORUS: Phosphate:MCnc:Pt:Ser/Plas:Qn:: 3

## 2018-03-27 MED FILL — MYFORTIC 180 MG TABLET,DELAYED RELEASE: 30 days supply | Qty: 180 | Fill #2

## 2018-03-27 MED FILL — MYFORTIC 180 MG TABLET,DELAYED RELEASE: 30 days supply | Qty: 180 | Fill #2 | Status: AC

## 2018-03-27 MED FILL — PROGRAF 1 MG CAPSULE: 30 days supply | Qty: 180 | Fill #2

## 2018-03-27 MED FILL — PROGRAF 1 MG CAPSULE: 30 days supply | Qty: 180 | Fill #2 | Status: AC

## 2018-03-31 ENCOUNTER — Institutional Professional Consult (permissible substitution): Admit: 2018-03-31 | Discharge: 2018-04-01 | Payer: MEDICARE

## 2018-03-31 DIAGNOSIS — Z940 Kidney transplant status: Principal | ICD-10-CM

## 2018-03-31 LAB — BASIC METABOLIC PANEL
ANION GAP: 14 mmol/L (ref 7–15)
BLOOD UREA NITROGEN: 66 mg/dL — ABNORMAL HIGH (ref 7–21)
CALCIUM: 9.1 mg/dL (ref 8.5–10.2)
CHLORIDE: 94 mmol/L — ABNORMAL LOW (ref 98–107)
CO2: 28 mmol/L (ref 22.0–30.0)
CREATININE: 1.95 mg/dL — ABNORMAL HIGH (ref 0.70–1.30)
EGFR CKD-EPI AA MALE: 39 mL/min/{1.73_m2} — ABNORMAL LOW (ref >=60)
EGFR CKD-EPI NON-AA MALE: 34 mL/min/{1.73_m2} — ABNORMAL LOW (ref >=60)
GLUCOSE RANDOM: 217 mg/dL — ABNORMAL HIGH (ref 65–179)
POTASSIUM: 4.4 mmol/L (ref 3.5–5.0)
SODIUM: 136 mmol/L (ref 135–145)

## 2018-03-31 LAB — CHLORIDE: Chloride:SCnc:Pt:Ser/Plas:Qn:: 94 — ABNORMAL LOW

## 2018-03-31 MED ORDER — TRAMADOL 50 MG TABLET
ORAL_TABLET | Freq: Four times a day (QID) | ORAL | 2 refills | 0 days | Status: CP | PRN
Start: 2018-03-31 — End: 2018-06-29

## 2018-03-31 NOTE — Unmapped (Signed)
Labs drawn per Dr. Arvin Collard

## 2018-04-02 NOTE — Unmapped (Signed)
Creatinine still not back to baseline. Holding warfarin in case of need for biopsy. Recheck creatinine Thursday or Friday.

## 2018-04-04 ENCOUNTER — Institutional Professional Consult (permissible substitution): Admit: 2018-04-04 | Discharge: 2018-04-05 | Payer: MEDICARE

## 2018-04-04 DIAGNOSIS — N179 Acute kidney failure, unspecified: Secondary | ICD-10-CM

## 2018-04-04 DIAGNOSIS — R7989 Other specified abnormal findings of blood chemistry: Principal | ICD-10-CM

## 2018-04-04 DIAGNOSIS — Z940 Kidney transplant status: Secondary | ICD-10-CM

## 2018-04-04 LAB — BASIC METABOLIC PANEL
ANION GAP: 9 mmol/L (ref 7–15)
BLOOD UREA NITROGEN: 49 mg/dL — ABNORMAL HIGH (ref 7–21)
BUN / CREAT RATIO: 30
CALCIUM: 9.3 mg/dL (ref 8.5–10.2)
CHLORIDE: 97 mmol/L — ABNORMAL LOW (ref 98–107)
CO2: 32 mmol/L — ABNORMAL HIGH (ref 22.0–30.0)
CREATININE: 1.62 mg/dL — ABNORMAL HIGH (ref 0.70–1.30)
EGFR CKD-EPI NON-AA MALE: 42 mL/min/{1.73_m2} — ABNORMAL LOW (ref >=60)
GLUCOSE RANDOM: 101 mg/dL (ref 65–179)
POTASSIUM: 4.5 mmol/L (ref 3.5–5.0)
SODIUM: 138 mmol/L (ref 135–145)

## 2018-04-04 LAB — CHLORIDE: Chloride:SCnc:Pt:Ser/Plas:Qn:: 97 — ABNORMAL LOW

## 2018-04-04 NOTE — Unmapped (Signed)
Labs drawn per Dr. Arvin Collard

## 2018-04-05 NOTE — Unmapped (Signed)
Creatinine back to baseline 1.62. Called and spoke with Bill to restart coumadin and get INR checked Tuesday. Alerted Dr. Dellis Filbert so he can f/u INR.

## 2018-04-10 LAB — HLA DS POST TRANSPLANT
ANTI-DONOR DRW #1 MFI: 13 MFI
ANTI-DONOR HLA-A #1 MFI: 0 MFI
ANTI-DONOR HLA-B #1 MFI: 65 MFI
ANTI-DONOR HLA-B #2 MFI: 0 MFI
ANTI-DONOR HLA-C #1 MFI: 0 MFI
ANTI-DONOR HLA-C #2 MFI: 0 MFI
ANTI-DONOR HLA-DQB #1 MFI: 17 MFI
ANTI-DONOR HLA-DR #1 MFI: 0 MFI
ANTI-DONOR HLA-DR #2 MFI: 0 MFI

## 2018-04-10 LAB — ANTI-DONOR HLA-B #2 MFI: Lab: 0

## 2018-04-10 LAB — HLA CL2 AB RESULT: Lab: NEGATIVE

## 2018-04-10 LAB — FSAB CLASS 2 ANTIBODY SPECIFICITY: HLA CL2 AB RESULT: NEGATIVE

## 2018-04-10 LAB — HLA CLASS 1 ANTIBODY RESULT: Lab: NEGATIVE

## 2018-04-14 ENCOUNTER — Ambulatory Visit: Admit: 2018-04-14 | Discharge: 2018-04-15 | Payer: MEDICARE

## 2018-04-14 DIAGNOSIS — I2699 Other pulmonary embolism without acute cor pulmonale: Principal | ICD-10-CM

## 2018-04-15 NOTE — Unmapped (Signed)
MOnday is fine.  [name], M.D.     [facility] at [facility]   [address]  [address]  [place], [place]  [address]  [phone]

## 2018-04-15 NOTE — Unmapped (Signed)
Patient advised.

## 2018-04-15 NOTE — Unmapped (Signed)
Sherry from the lab called stating the PT test had to be cancelled due to insufficient quantity. Patient notified and can get redrawn tomorrow or Monday at his visit with you. What is your preference?

## 2018-04-16 NOTE — Unmapped (Signed)
[facility] Refill Coordination Note    Specialty Medication(s) to be Shipped:   Transplant: Myfortic 180mg  and Prograf 1mg      David Ellis, DOB: Jun 19, 1945  Phone: [phone] (home) [phone] (work)    All above HIPAA information was verified with patient.     Completed refill call assessment today to schedule patient's medication shipment from the [facility] [phone]).       Specialty medication(s) and dose(s) confirmed: Regimen is correct and unchanged.   Changes to medications: Ronav reports starting the following medications: Trilicity 0.75mg  ( Inject the contents of 1 pen (0.75mg ) once a week.  Changes to insurance: No  Questions for the pharmacist: No    The patient will receive a drug information handout for each medication shipped and additional FDA Medication Guides as required.      DISEASE/MEDICATION-SPECIFIC INFORMATION        N/A    ADHERENCE     Medication Adherence    Patient reported X missed doses in the last month:  0  Specialty Medication:  Myfortic 180mg  & Prograf 1mg    Patient is on additional specialty medications:  No  Patient is on more than two specialty medications:  No  Any gaps in refill history greater than 2 weeks in the last 3 months:  no  Demonstrates understanding of importance of adherence:  yes  Informant:  patient  Reliability of informant:  reliable      Adherence tools used:  patient uses a pill box to manage medications      Support network for adherence:  family member      Confirmed plan for next specialty medication refill:  delivery by pharmacy          Refill Coordination    Is the Shipping Address Different:  No         MEDICARE PART B DOCUMENTATION     Myfortic 180mg : Patient has 10 days worth of tablets on hand.  Prograf 1mg : Patient has 10 days worth of capsules on hand.    SHIPPING     Shipping address confirmed in Epic.     Delivery Scheduled: Yes, Expected medication delivery date: 04/25/2018 via UPS or courier. Medication will be delivered via UPS to the home address in [employer] [place].    Breeanne Oblinger P Allena Katz   [facility] Specialty Technician

## 2018-04-21 ENCOUNTER — Ambulatory Visit: Admit: 2018-04-21 | Discharge: 2018-04-21 | Payer: MEDICARE

## 2018-04-21 ENCOUNTER — Ambulatory Visit: Admit: 2018-04-21 | Discharge: 2018-04-21 | Payer: MEDICARE | Attending: Internal Medicine | Primary: Internal Medicine

## 2018-04-21 DIAGNOSIS — I4891 Unspecified atrial fibrillation: Secondary | ICD-10-CM

## 2018-04-21 DIAGNOSIS — I159 Secondary hypertension, unspecified: Principal | ICD-10-CM

## 2018-04-21 DIAGNOSIS — I10 Essential (primary) hypertension: Secondary | ICD-10-CM

## 2018-04-21 DIAGNOSIS — E1129 Type 2 diabetes mellitus with other diabetic kidney complication: Secondary | ICD-10-CM

## 2018-04-21 DIAGNOSIS — Z940 Kidney transplant status: Secondary | ICD-10-CM

## 2018-04-21 DIAGNOSIS — R002 Palpitations: Principal | ICD-10-CM

## 2018-04-21 DIAGNOSIS — E785 Hyperlipidemia, unspecified: Secondary | ICD-10-CM

## 2018-04-21 DIAGNOSIS — E1165 Type 2 diabetes mellitus with hyperglycemia: Secondary | ICD-10-CM

## 2018-04-21 DIAGNOSIS — I2699 Other pulmonary embolism without acute cor pulmonale: Secondary | ICD-10-CM

## 2018-04-21 DIAGNOSIS — Z0000 Encounter for general adult medical examination without abnormal findings: Secondary | ICD-10-CM

## 2018-04-21 LAB — CBC W/ AUTO DIFF
BASOPHILS ABSOLUTE COUNT: 0 10*9/L (ref 0.0–0.1)
BASOPHILS RELATIVE PERCENT: 0.3 %
EOSINOPHILS ABSOLUTE COUNT: 0.1 10*9/L (ref 0.0–0.4)
EOSINOPHILS RELATIVE PERCENT: 0.6 %
HEMATOCRIT: 47.6 % (ref 41.0–53.0)
HEMOGLOBIN: 15.3 g/dL (ref 13.5–17.5)
LARGE UNSTAINED CELLS: 1 % (ref 0–4)
LYMPHOCYTES RELATIVE PERCENT: 11.7 %
MEAN CORPUSCULAR HEMOGLOBIN: 28.3 pg (ref 26.0–34.0)
MEAN CORPUSCULAR VOLUME: 88 fL (ref 80.0–100.0)
MEAN PLATELET VOLUME: 8.8 fL (ref 7.0–10.0)
MONOCYTES ABSOLUTE COUNT: 0.6 10*9/L (ref 0.2–0.8)
MONOCYTES RELATIVE PERCENT: 5.8 %
NEUTROPHILS ABSOLUTE COUNT: 8.2 10*9/L — ABNORMAL HIGH (ref 2.0–7.5)
NEUTROPHILS RELATIVE PERCENT: 80.5 %
PLATELET COUNT: 283 10*9/L (ref 150–440)
RED BLOOD CELL COUNT: 5.41 10*12/L (ref 4.50–5.90)
RED CELL DISTRIBUTION WIDTH: 14.2 % (ref 12.0–15.0)
WBC ADJUSTED: 10.2 10*9/L (ref 4.5–11.0)

## 2018-04-21 LAB — COMPREHENSIVE METABOLIC PANEL
ALBUMIN: 4.1 g/dL (ref 3.5–5.0)
ALKALINE PHOSPHATASE: 87 U/L (ref 38–126)
ALT (SGPT): 23 U/L (ref <50)
ANION GAP: 9 mmol/L (ref 7–15)
AST (SGOT): 30 U/L (ref 19–55)
BILIRUBIN TOTAL: 0.6 mg/dL (ref 0.0–1.2)
BLOOD UREA NITROGEN: 48 mg/dL — ABNORMAL HIGH (ref 7–21)
BUN / CREAT RATIO: 25
CALCIUM: 9.4 mg/dL (ref 8.5–10.2)
CHLORIDE: 98 mmol/L (ref 98–107)
CO2: 30 mmol/L (ref 22.0–30.0)
CREATININE: 1.94 mg/dL — ABNORMAL HIGH (ref 0.70–1.30)
EGFR CKD-EPI NON-AA MALE: 34 mL/min/{1.73_m2} — ABNORMAL LOW (ref >=60)
GLUCOSE RANDOM: 221 mg/dL — ABNORMAL HIGH (ref 65–179)
POTASSIUM: 4.5 mmol/L (ref 3.5–5.0)
PROTEIN TOTAL: 7 g/dL (ref 6.5–8.3)
SODIUM: 137 mmol/L (ref 135–145)

## 2018-04-21 LAB — PHOSPHORUS: Phosphate:MCnc:Pt:Ser/Plas:Qn:: 3.3

## 2018-04-21 LAB — TACROLIMUS BLOOD: Lab: 6.3

## 2018-04-21 LAB — CREATININE: Creatinine:MCnc:Pt:Ser/Plas:Qn:: 1.94 — ABNORMAL HIGH

## 2018-04-21 LAB — MONOCYTES RELATIVE PERCENT: Lab: 5.8

## 2018-04-21 LAB — PROTIME: Lab: 26.3 — ABNORMAL HIGH

## 2018-04-21 LAB — PROTIME-INR
INR: 2.25
PROTIME: 26.3 s — ABNORMAL HIGH (ref 10.2–13.1)

## 2018-04-21 NOTE — Unmapped (Addendum)
Problem List Items Addressed This Visit        High    Type II diabetes mellitus with renal manifestations, uncontrolled (CMS-HCC)     Check today and will convey to Dr. Tiburcio Pea.         Hypertension - Primary     Blood pressure is a touch low today.   Probably why you're feelin gpoorly as is the afib.    HOld chlorthalidone for 2-3 days and only restart if weight goes up 2-3 pounds or if bp is above 140/90.    After the cardioversion, bp should return.  If it does not let me know.         Pulmonary embolism (CMS-HCC)     Can discuss about changing coumadin to eliquis.         Atrial fibrillation (CMS-HCC)     PLanning CV with TEE this week or next week.  Please speak with Dr. Adair Patter (after echo) about the possibiility of changing coumadin to eliquis.    However, keep in mind that you are coming for labs monthly anyways.           Routine general medical examination at a health care facility     Flu shot already thsi year.

## 2018-04-21 NOTE — Unmapped (Signed)
Flu shot already thsi year.

## 2018-04-21 NOTE — Unmapped (Signed)
Check today and will convey to Dr. Tiburcio Pea.

## 2018-04-21 NOTE — Unmapped (Signed)
Assessment and Plan:   David Ellis is a 72 y.o. male with a history of atrial fibrillation, hypertension, atrial flutter status post ablation, and pulmonary embolism status post catheter directed lytics 05/2013 with subsequent RV dysfunction who presents in clinic today for follow-up.    1. Atrial fibrillation  Patient has recurred with atrial fibrillation and appears to be symptomatic.  Suspect much of his fatigue, palpitations, and generalized weakness are attributable to atrial fibrillation.  Patient questions whether or not the lower dose of metoprolol may have contributed.  I suspect not as he had been doing well in the 2 months after lowering the dose.  Instead, I suspect the trigger was his fall, injuries, and impaired renal function that occurred on his cruise.  At this point, we discussed setting him up with a cardioversion to help restore normal rhythm.  He is not currently on an antiarrhythmic but traditionally has had very infrequent episodes of atrial fibrillation.  I am hoping now that his renal function has returned to normal and his acute issues otherwise have improved that he will be able to maintain normal rhythm.  If not, rhythm control may prove challenging given his comorbidities and transplant medication regimen.  Plan to cross that bridge if and when we get there.  Of note, he did hold warfarin for 3 days 3 weeks ago in anticipation of potential biopsy.  With this in mind, we will set him up for TEE prior to cardioversion.  He is due for INR today to make sure he is back to therapeutic levels.    2. Hypertension, hyperkalemia  Blood pressure is well-controlled, no changes to current regimen.    3. Hyperlipidemia  Lipids well controlled, continue pravastatin.          Vevelyn Francois, MD  [facility]  Pager [phone]      Subjective:   PCP: [name], MD  Patient: David Ellis  DOB: [birth year]    Reason for visit:  Afib, HTN, RV failure  HPI: David Ellis is a 72 y.o. male with a history of atrial fibrillation, hypertension, atrial flutter status post ablation, and pulmonary embolism status post catheter directed lytics 05/2013 with subsequent RV dysfunction who presents in clinic today for follow-up.  At his last visit, we had lowered his dose of metoprolol from 200 mg twice daily to 200 mg in the morning and 100 mg in the evening.  Patient did well for a couple months on this regimen.  He then went on a [employer] cruise in [place] for 2 weeks in October.  Unfortunately, 3 days into his 15-day trip, he sustained a fall.  He missed a step and struck his back.  He had severe flank pain and bruising.  He had difficulty getting out of bed for the remaining 10 days of the trip.  His wife also reports that he became dehydrated because he was not drinking very much.  Upon his return, his renal function had become impaired.  Aggressive hydration was pushed and the creatinine has since returned to normal.  He did briefly hold warfarin in anticipation of biopsy.  However, biopsy was not performed as his creatinine had improved.  He was off warfarin for a total of 3 days at the very end of October and early November.    However, over the past 3 weeks, he has noticed palpitations.  He describes his heart beating in his throat.  This has been associated with significant fatigue and feeling  tired.  He also complains of pain in his back and shoulders.  He denies any chest pain or shortness of breath.  He has felt dizzy at times when he sneezes.  He otherwise denies fevers or other complaints today.    ______________________________________________________________________  Pertinent Medical History, Cardiovascular History & Procedures:    Pertinent PMH:   ?? AFib  ?? Typical atrial flutter s/p ablation 06/2011  ?? DM  ?? 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 ESRD s/p transplant  ?? HTN  ?? OSA on CPAP  ?? Skin CA  ?? PE s/p catheter directed lytics 05/2013  ?? 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 Wedge defects in left lung without any thrombus visualized, could represent chronic changes or small subsegmental PEs.    Risk Factors: HTN, DM     Cath / PCI:   ?? None    CV Surgery:   ?? None    EP Procedures and Devices:   ?? None    Non-Invasive Evaluation(s):   ?? TTE 3/17: Normal EF, LVH, grade 1 diastolic dysfunction  ?? Ziopatch 2/16: Rare SVE and VE. 29 episodes of SVT, longest episode lasting 20 beats. No atrial fibrillation identified.  ?? TTE from 06/30/13: Normal EF 55-60%, mildly dilated RV, mild RV dysfunction.  ?? Nuclear stress test 07/2011:  ?? Normal myocardial perfusion study  ?? No evidence for significant ischemia or scar EF 62% and RV is mildly dilated.  ?? Minimal coronary calcification is noted involving the LAD and RCA    ______________________________________________________________________    Other past medical history, social history, family history, medications, allergies and problem list reviewed in the medical record.    Current cardiac medications include:   ?? Metoprolol 200 mg in the morning and 100 mg in the evening  ?? Chlorthalidone 25 mg daily  ?? Furosemide 40 mg once daily  ?? Irbesartan 150 mg daily  ?? Warfarin  ?? Pravastatin 20 mg daily      Objective:     BP 114/58 (BP Site: R Arm, BP Position: Sitting, BP Cuff Size: Large)  - Pulse 72  - Resp 18  - Ht 185.4 cm (6' 0.99)  - Wt (!) 121.1 kg (267 lb)  - SpO2 94%  - BMI 35.23 kg/m??     PHYSICAL EXAMINATION:   GENERAL: Alert, NAD  EYES: Sclerae clear, EOMI b/l  ENT: OP clear w/o exudate  NECK: Supple  CARDIOVASCULAR: Regular rate and irregular rhythm, normal S1/S2, no murmurs, rubs, or gallops.  RESPIRATORY: Clear to auscultation bilaterally. No wheezes, crackles, or rhonchi. Normal work of breathing.  ABDOMEN/GI: Soft, non-tender, non-distended with normoactive bowel sounds.  NEUROLOGIC: CN III-XII in tact, motor exam grossly non-focal.  SKIN: Small, scattered ecchymoses  PSYCH: Normal mental status, mood, and affect.  MSK: No effusions      ______________________________________________________________________  EKG today shows atrial fibrillation, normal axis, nonspecific ST/T changes    Recent CV pertinent labs:  Lab Results   Component Value Date    LDL Calculated 37 (L) 01/23/2018    LDL Calculated 74 04/21/2015    Non-HDL Cholesterol 100 01/23/2018    HDL 30 (L) 01/23/2018    HDL 36 (L) 04/21/2015    INR, POC 1.3 07/08/2015    INR, POC 2.80 12/10/2014    INR 3.08 03/27/2018    INR 3.0 02/11/2018    PRO-BNP 375 (H) 06/29/2014    Creatinine 1.62 (H) 04/04/2018  Creatinine 1.7 (H) 07/09/2017    Creatinine 1.36 08/09/2014    Potassium 4.5 04/04/2018    Potassium 4.6 07/09/2017    BUN 49 (H) 04/04/2018    BUN 49.00 (H) 07/09/2017    TSH 2.202 07/02/2017    TSH 1.20 07/02/2011       Chemistry        Component Value Date/Time    NA 138 04/04/2018 1203    NA 139 07/09/2017 1358    K 4.5 04/04/2018 1203    K 4.6 07/09/2017 1358    CL 97 (L) 04/04/2018 1203    CL 104 07/09/2017 1358    CO2 32.0 (H) 04/04/2018 1203    CO2 24.2 07/09/2017 1358    BUN 49 (H) 04/04/2018 1203    BUN 49.00 (H) 07/09/2017 1358    CREATININE 1.62 (H) 04/04/2018 1203    CREATININE 1.7 (H) 07/09/2017 1358    GLU 101 04/04/2018 1203    GLU 248.00 (H) 07/09/2017 1358        Component Value Date/Time    CALCIUM 9.3 04/04/2018 1203    CALCIUM 8.9 07/09/2017 1358    ALKPHOS 98 03/27/2018 0925    ALKPHOS 91 04/21/2015 0858    AST 27 03/27/2018 0925    AST 24 04/21/2015 0858    ALT 28 03/27/2018 0925    ALT 22 04/21/2015 0858    BILITOT 0.9 03/27/2018 0925    BILITOT 0.5 04/21/2015 2725

## 2018-04-21 NOTE — Unmapped (Signed)
Can discuss about changing coumadin to eliquis.

## 2018-04-21 NOTE — Unmapped (Signed)
Blood pressure is a touch low today.   Probably why you're feelin gpoorly as is the afib.    HOld chlorthalidone for 2-3 days and only restart if weight goes up 2-3 pounds or if bp is above 140/90.    After the cardioversion, bp should return.  If it does not let me know.

## 2018-04-21 NOTE — Unmapped (Signed)
Patient ID: David Ellis is a 72 y.o. male who presents for eval of medial issues.      Assessment/Plan:        Routine general medical examination at a health care facility  Flu shot already thsi year.      Hypertension  Blood pressure is a touch low today.   Probably why you're feelin gpoorly as is the afib.    HOld chlorthalidone for 2-3 days and only restart if weight goes up 2-3 pounds or if bp is above 140/90.    After the cardioversion, bp should return.  If it does not let me know.    Type II diabetes mellitus with renal manifestations, uncontrolled (CMS-HCC)  Check today and will convey to Dr. Tiburcio Pea.    Atrial fibrillation (CMS-HCC)  PLanning CV with TEE this week or next week.  Please speak with Dr. Adair Patter (after echo) about the possibiility of changing coumadin to eliquis.    However, keep in mind that you are coming for labs monthly anyways.      Pulmonary embolism (CMS-HCC)  Can discuss about changing coumadin to eliquis.      Orders Placed This Encounter   Procedures   ??? PT-INR       I have reviewed and addressed the patient???s adherence and response to prescribed medications. I have identified patient barriers to following the proposed medication and treatment plan, and have noted opportunities to optimize healthy behaviors. I have answered the patient???s questions to satisfaction and the patient voices understanding.           Return in about 4 months (around 08/20/2018) for Annual physical.     Subjective:     Current Health Status  Patient Active Problem List    Diagnosis Date Noted   ??? Routine general medical examination at a health care facility 11/17/2013     Priority: High   ??? Atrial fibrillation (CMS-HCC) 07/06/2013     Priority: High   ??? Right ventricular dysfunction 06/30/2013     Priority: High   ??? Pulmonary embolism (CMS-HCC) 05/23/2013     Priority: High   ??? Transplanted kidney      Priority: High   ??? Mixed hyperlipidemia 10/30/2011     Priority: High   ??? Type II diabetes mellitus with renal manifestations, uncontrolled (CMS-HCC) 03/26/2011     Priority: High   ??? Hypertension 10/10/2010     Priority: High   ??? Class 2 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 01/01/2018   ??? PAD (peripheral artery disease) (CMS-HCC) 02/12/2017   ??? Hypoglycemia associated with type 2 diabetes mellitus (CMS-HCC) 09/03/2016   ??? BCC (basal cell carcinoma), face 07/18/2015   ??? Left knee pain 11/09/2014   ??? On statin therapy due to risk of future cardiovascular event 10/04/2014   ??? 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 Acquired absence of other toe(s), unspecified side (CMS-HCC) 08/10/2014   ??? Diabetic polyneuropathy associated with type 2 diabetes mellitus (CMS-HCC) 03/15/2014   ??? Aftercare following organ transplant 08/27/2013   ??? Edema 06/30/2013   ??? 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 History of kidney transplant 10/30/2011   ??? 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 Diverticulosis of colon 03/09/2011   ??? History of colonic polyps 03/09/2011      Here today for eval of medical issues.  He has had a difficult run.  While in [place], he fell and hurt himself.  Although  no broken bones, he was limited in his walking.  THen returned and noted ot have elevated creatinine; planning on renal biopsy but was deferred due to creatinien back down again.    Unfortunately, over the pat few weeks, he has noted palpitations and has recurred back into afib.  Saw Cards today who recommeneded DC CV which will be done either this weke or next, with preprocedure TEE (coumadin held for a few days).    No bleeding.  NO weight gain or fluid retention.  NO chest pains or sob.      Allergies   Allergen Reactions   ??? Levofloxacin Other (See Comments)     Other reaction(s): Unknown  Unknown   ??? Lidocaine Other (See Comments)     I don't remember States is fine with Bupivicaine   ??? Penicillins      As child  Other reaction(s): UNKNOWN.   (UPDATE: Tolerated amp/sulbactam without side effects during 10/2014 hospitalization)   ??? Egg Derived Rash   ??? Enalapril Cough   ??? Epinephrine Palpitations   ??? Grass Pollen-[place], Standard Itching   ??? Lisinopril Rash   ??? Mepivacaine Hcl Palpitations       Current Outpatient Medications   Medication Sig Dispense Refill   ??? aspirin (ECOTRIN) 81 MG tablet Take 81 mg by mouth continuous as needed. Only when flying and/or traveling by car more than 2 hours.     ??? BD INSULIN SYRINGE ULTRA-FINE 0.3 mL 31 gauge x 15/64 Syrg 1 each by Other route Three (3) times a day. 300 Syringe 3   ??? BD ULTRA-FINE NANO PEN NEEDLE 32 gauge x 5/32 Ndle USE UP TO 3 TIMES A DAY 300 each 3   ??? blood sugar diagnostic (FREESTYLE PRECISION NEO STRIPS) Strp Use to test blood sugar 4 times daily. Dx E11.65. 150 each 5   ??? blood-glucose meter kit Use as instructed. 1 each 0   ??? chlorthalidone (HYGROTON) 25 MG tablet TAKE ONE TABLET BY MOUTH EVERY DAY 60 tablet 5   ??? cholecalciferol, vitamin D3, (VITAMIN D3) 2,000 unit Tab Take 2,000 Units by mouth daily.      ??? dulaglutide (TRULICITY) 0.75 mg/0.5 mL injection pen Inject 0.5 mL (0.75 mg total) under the skin once a week. 4 Syringe 11   ??? dulaglutide (TRULICITY) 1.5 mg/0.5 mL PnIj Inject 1.5 mg under the skin once a week. 4 Syringe 11   ??? furosemide (LASIX) 40 MG tablet TAKE 1 TO 2 TABLETS ONCE A DAY 180 tablet 3   ??? gabapentin (NEURONTIN) 300 MG capsule TAKE 1 CAPSULE TWICE DAILY 180 capsule 5   ??? HUMULIN R U-500, CONC, KWIKPEN 500 unit/mL (3 mL) CONCENTRATED injection DIAL TO MAX 100 UNITS BEFORE BREAKFAST, LUNCH, AND DINNER.  INJECT BEFOREMEALS MAX OF 300 UNITS DAILY 18 mL 11   ??? hydrocortisone 2.5 % cream   0   ??? irbesartan (AVAPRO) 150 MG tablet TAKE ONE TABLET AT BEDTIME 90 tablet 3   ??? ketoconazole (NIZORAL) 2 % cream   0   ??? lancets Misc 1 each by Miscellaneous route Four (4) times a day. (Patient not taking: Reported on 01/07/2018) 150 each 6   ??? magnesium chloride (SLOW_MAG) 64 mg TbEC Take 128 mg by mouth three (3) times a day (at 6am, noon and 6pm).      ??? metoprolol succinate (TOPROL-XL) 200 MG 24 hr tablet Take 200 mg in the morning and 100 mg in the evening 135 each 3   ???  multivitamin capsule Take 1 capsule by mouth daily.     ??? MYFORTIC 180 mg EC tablet TAKE 3 TABLETS (540 MG) BY MOUTH TWICE DAILY 180 tablet 35   ??? pravastatin (PRAVACHOL) 20 MG tablet TAKE ONE TABLET BY MOUTH EVERY DAY 90 each 3   ??? PROGRAF 1 mg capsule TAKE 3 CAPSULES (3 MG) BY MOUTH TWICE DAILY 180 capsule 11   ??? traMADol (ULTRAM) 50 mg tablet Take 1 tablet (50 mg total) by mouth every six (6) hours as needed for pain. (Patient not taking: Reported on 04/21/2018) 30 tablet 2   ??? warfarin (COUMADIN) 2.5 MG tablet 2.5 mg. mondays  5   ??? warfarin (COUMADIN) 5 MG tablet Take 5 mg every day except mondays (Patient taking differently: 5 days a week) 30 tablet 6     No current facility-administered medications for this visit.      Facility-Administered Medications Ordered in Other Visits   Medication Dose Route Frequency Provider Last Rate Last Dose   ??? bevacizumab    PRN (once a day) Melvyn Novas, MD   2.5 mg at 01/28/17 0750       Past Medical History:   Diagnosis Date   ??? Atrial flutter (CMS-HCC)    ??? Diabetes mellitus (CMS-HCC)    ??? Diabetic nephropathy (CMS-HCC)    ??? Diabetic retinopathy (CMS-HCC)    ??? Fractures    ??? Ganglion cyst    ??? Hand injury    ??? Heart disease    ??? Hyperlipidemia    ??? Hypertension    ??? Joint pain    ??? Osteomyelitis (CMS-HCC) June 2016   ??? Pulmonary embolism (CMS-HCC) Dec 2015   ??? 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 Stroke (CMS-HCC)    ??? Tear of meniscus of knee    ??? 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 NEPHRECTOMY TRANSPLANTED ORGAN Right     LURD - spouse received in 2013.   ??? PR AMPUTATION METATARSAL+TOE,SINGLE Left 07/06/2014    Procedure: AMPUTATION, METATARSAL, WITH TOE SINGLE;  Surgeon: Marion Downer, MD;  Location: MAIN OR [facility];  Service: Vascular   ??? PR AMPUTATION METATARSAL+TOE,SINGLE Left 10/28/2014    Procedure: AMPUTATION, METATARSAL, WITH TOE SINGLE;  Surgeon: Maple Mirza, MD;  Location: MAIN OR [facility];  Service: Vascular   ??? PR VITRECTOMY,PANRETINAL LASER RX Right 11/26/2016    Procedure: VITRECTOMY, MECHANICAL, PARS PLANA APPROACH; WITH ENDOLASER PANRETINAL PHOTOCOAGULATION;  Surgeon: Melvyn Novas, MD;  Location[facility];  Service: Ophthalmology   ??? PR VITRECTOMY,PANRETINAL LASER RX Left 01/28/2017    Procedure: VITRECTOMY, MECHANICAL, PARS PLANA APPROACH; WITH ENDOLASER PANRETINAL PHOTOCOAGULATION;  Surgeon: Melvyn Novas, MD;  Location: [facility] OR [facility];  Service: Ophthalmology   ??? SKIN BIOPSY         Family History   Problem Relation Age of Onset   ??? Kidney disease Mother    ??? Diabetes Mother    ??? Kidney disease Father    ??? Kidney disease Maternal Grandmother    ??? Diabetes Maternal Grandmother        Social History     Tobacco Use   ??? Smoking status: Never Smoker   ??? Smokeless tobacco: Never Used   Substance Use Topics   ??? Alcohol use: No   ??? Drug use: No  Objective:       Vital Signs  BP 98/68 (BP Site: R Arm, BP Position: Sitting)  - Pulse 62  - Ht 182.9 cm (6')  - Wt (!) 118.8 kg (262 lb)  - SpO2 96%  - BMI 35.53 kg/m??      Exam  Physical Exam:    Well developed, well nourished male in no acute distress.      Vitals:    04/21/18 0911   BP: 98/68   Pulse: 62   SpO2: 96%     LUNGS:  CTA  CARDIOVASCULAR:  IIRR without murmurs, or gallops, or rubs.  Pulses 2+ bilaterally and symmetric.    ABDOMEN:  NABS/ NT/ND/soft.  NO hsm nor abdominal masses.    EXTREMITIES:  No c/c/e.  No ulcers or abrasions.

## 2018-04-21 NOTE — Unmapped (Signed)
This patient's last AWV date: [facility] Last Medicare Wellness Visit Date: 11/17/2013  Abstraction Result Flowsheet Data    Reason for Encounter  Reason for Encounter: Outreach  Primary Reason for Call: AWV  Scheduling Outcome: Left message  Upfront: No

## 2018-04-21 NOTE — Unmapped (Signed)
PLanning CV with TEE this week or next week.  Please speak with Dr. Adair Patter (after echo) about the possibiility of changing coumadin to eliquis.    However, keep in mind that you are coming for labs monthly anyways.

## 2018-04-22 NOTE — Unmapped (Signed)
Patient notified of results, voiced understanding.  Anticoagulation tracker updated.

## 2018-04-22 NOTE — Unmapped (Signed)
-----   Message from Sharlee Blew, MD sent at 04/21/2018  1:19 PM EST -----  INR ok.   Same dose; repeat INR One month.    [name], M.D.     [facility] at [facility] [address]   [address]  [address]  [place], [place]  [address]  [phone]

## 2018-04-24 ENCOUNTER — Ambulatory Visit: Admit: 2018-04-24 | Discharge: 2018-04-25 | Payer: MEDICARE | Attending: "Endocrinology | Primary: "Endocrinology

## 2018-04-24 DIAGNOSIS — E1165 Type 2 diabetes mellitus with hyperglycemia: Secondary | ICD-10-CM

## 2018-04-24 DIAGNOSIS — E1129 Type 2 diabetes mellitus with other diabetic kidney complication: Principal | ICD-10-CM

## 2018-04-24 MED FILL — PROGRAF 1 MG CAPSULE: 30 days supply | Qty: 180 | Fill #3 | Status: AC

## 2018-04-24 MED FILL — MYFORTIC 180 MG TABLET,DELAYED RELEASE: 30 days supply | Qty: 180 | Fill #3 | Status: AC

## 2018-04-24 MED FILL — MYFORTIC 180 MG TABLET,DELAYED RELEASE: 30 days supply | Qty: 180 | Fill #3

## 2018-04-24 MED FILL — PROGRAF 1 MG CAPSULE: 30 days supply | Qty: 180 | Fill #3

## 2018-04-24 NOTE — Unmapped (Addendum)
ASSESSMENT / PLAN  1. Type II diabetes mellitus with renal manifestations, uncontrolled (CMS-HCC)  Glucoses in general very well controlled on u500 insulin and Trulicity 0.75mg  weekly.   Continue to watch portion size and carb intake with some days showing excellent control on current regimen.     (9-10am) Bfast DIAL UP TO 105 UNITS (21 CLICKS)   ????????????????????????Reduce to 70 units if premeal glucose 75-100 (14 clicks)  ????????????????????????Reduce 60 units if glucose <??75 AND you are getting ready to eat a full meal (12 clicks)  (2pm) Lunch DIAL UP TO 55 UNITS (11 CLICKS)  Try reducing to 6 clicks with PNButter sandwich.  (7ish) Dinner: DIAL UP TO 40 UNITS (8 CLICKS)  For seafood, reduce to 30units (6 clicks)  ????????????????????????+45 ????units for glucose 201- 300 (9 clicks)  ????????????????????????+50 ????units 301- 400  (10 clicks)  If glucose <??100 at meal, reduce dose by 75%, and if glucose is <??75, reduce dose 50%.     MEDICARE:   *The patient requires a therapeutic CGM.   *The patient has diabetes mellitus and is insulin-treated with 3 or more daily injections of insulin(MDII).   *The patient has been using CGM and is checking glucose 4 or more times per day x 30 days.  *The patient???s insulin treatment regimen requires frequent adjustments by the patient on the basis of therapeutic CGM testing results.   *Within the last six months prior to ordering the CGM, I had an in-person visit with the beneficiary to evaluate their diabetes control and determine that the above criteria are met.   *Every six months following the initial prescription of the CGM, I will have an in-person visit with the beneficiary to assess adherence to their CGM regimen and diabetes treatment plan.      Greater than 50% of 25 min visit was spent face to face with patient on counseling and coordination of care regarding management of insulin dependent diabetes with multiple complications as detailed in note.    RTC 6 mo unless concerns sooner.    SUBJECTIVE  PCP: [name] Tplant: Amy Mottle  Optho:  Herma Ard  Wound: Keagy  Podiatry: Dr. Ether Griffins, Gavin Potters     HPI: David Ellis is a 72 y.o. male who I last saw 01/2018 returns for follow up of T2DM with hx of kidney transplant.    Here with wife.   Now in afib with planned procedures next week. He is nervous about potentially changing coumadin.  Loves his [employer].   Maintains Trulicity 0.75mg  weekly (he thinks he had diarrhea on higher dose of 1.5mg  weekly) and u500 insulin:  (9-10am) Bfast DIAL UP TO 105 UNITS (21 CLICKS)   ????????????????????????Reduce to 70 units if premeal glucose 75-100 (14 clicks)  ????????????????????????Reduce 60 units if glucose <??75 AND you are getting ready to eat a full meal (12 clicks)  (2pm) Lunch DIAL UP TO 55 UNITS (11 CLICKS)  Try reducing to 6 clicks with PNButter sandwich.  (7ish) Dinner: DIAL UP TO 40 UNITS (8 CLICKS)  For seafood, reduce to 30units (6 clicks)  ????????????????????????+45 ????units for glucose 201- 300 (9 clicks)  ????????????????????????+50 ????units 301- 400  (10 clicks)  If glucose <??100 at meal, reduce dose by 75%, and if glucose is <??75, reduce dose 50%.         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 He is aware of reduced vision.   CKD followed by Nephrology. UPC 03/2018.  Peripheral neuropathy w hx  of amputation.  On Neurontin. Has DM shoes with no open wounds on feet.   BP tightly controlled.Hiram Gash dizzyness if he stands too quickly.   Lipids on low dose pravachol.    ASA only prn on chonic coumadin.    Endocrine Obtained / Updated Problem List:   1. T2DM dx 1992, insulin roughly 2000:   ?? Proliferative DR s/p hx vitreous hemorrhage and endolaser therapy. Last exam 01/2018.  ?? CKD s/p renal transplant  ?? 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 No vibratory 06/2017.   ?? Hypoglycemic emergency requiring EMS 03/2011.   ?? Considered Victoza in past, though he was resistant in setting of costs and already multiple daily injections. Trulicity 600$ per mo. Ozempic not covered by Medicare.  ?? Offered to check cpeptide to see if he would qualify for insulin pump under medcare, though he is not interested.    ?? Off MTF since end of 2015/early 2016.   2. 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   3. HTN   4. Nephrolithiasis   5. OSA on cpap   6. Aflutter s/p ablation 06/2011, atrial fibrillation  7. PE    Patient Active Problem List   Diagnosis   ??? Type II diabetes mellitus with renal manifestations, uncontrolled (CMS-HCC)   ??? Hypertension   ??? History of kidney transplant   ??? Mixed hyperlipidemia   ??? 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 Transplanted kidney   ??? Pulmonary embolism (CMS-HCC)   ??? Edema   ??? Right ventricular dysfunction   ??? Atrial fibrillation (CMS-HCC)   ??? Aftercare following organ transplant   ??? Diverticulosis of colon   ??? Routine general medical examination at a health care facility   ??? Diabetic polyneuropathy associated with type 2 diabetes mellitus (CMS-HCC)   ??? Toe amputation status   ??? On statin therapy due to risk of future cardiovascular event   ??? Left knee pain   ??? BCC (basal cell carcinoma), face   ??? Acquired absence of other toe(s), unspecified side (CMS-HCC)   ??? History of colonic polyps   ??? 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 Hypoglycemia associated with type 2 diabetes mellitus (CMS-HCC)   ??? PAD (peripheral artery disease) (CMS-HCC)   ??? Class 2 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     Past Medical History:   Diagnosis Date   ??? Atrial flutter (CMS-HCC)    ??? Diabetes mellitus (CMS-HCC)    ??? Diabetic nephropathy (CMS-HCC)    ??? Diabetic retinopathy (CMS-HCC)    ??? Fractures    ??? Ganglion cyst    ??? Hand injury    ??? Heart disease    ??? Hyperlipidemia    ??? Hypertension    ??? Joint pain    ??? Osteomyelitis (CMS-HCC) June 2016   ??? Pulmonary embolism (CMS-HCC) Dec 2015   ??? 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 Stroke (CMS-HCC)    ??? Tear of meniscus of knee    ??? Transplanted kidney 08/21/2011     Medicines:     Current Outpatient Medications   Medication Sig Dispense Refill   ??? aspirin (ECOTRIN) 81 MG tablet Take 81 mg by mouth continuous as needed. Only when flying and/or traveling by car more than 2 hours.     ??? BD INSULIN  SYRINGE ULTRA-FINE 0.3 mL 31 gauge x 15/64 Syrg 1 each by Other route Three (3) times a day. 300 Syringe 3   ??? BD ULTRA-FINE NANO PEN NEEDLE 32 gauge x 5/32 Ndle USE UP TO 3 TIMES A DAY 300 each 3   ??? blood sugar diagnostic (FREESTYLE PRECISION NEO STRIPS) Strp Use to test blood sugar 4 times daily. Dx E11.65. 150 each 5   ??? cholecalciferol, vitamin D3, (VITAMIN D3) 2,000 unit Tab Take 2,000 Units by mouth daily.      ??? dulaglutide (TRULICITY) 0.75 mg/0.5 mL injection pen Inject 0.5 mL (0.75 mg total) under the skin once a week. 4 Syringe 11   ??? dulaglutide (TRULICITY) 1.5 mg/0.5 mL PnIj Inject 1.5 mg under the skin once a week. 4 Syringe 11   ??? furosemide (LASIX) 40 MG tablet TAKE 1 TO 2 TABLETS ONCE A DAY 180 tablet 3   ??? gabapentin (NEURONTIN) 300 MG capsule TAKE 1 CAPSULE TWICE DAILY 180 capsule 5   ??? HUMULIN R U-500, CONC, KWIKPEN 500 unit/mL (3 mL) CONCENTRATED injection DIAL TO MAX 100 UNITS BEFORE BREAKFAST, LUNCH, AND DINNER.  INJECT BEFOREMEALS MAX OF 300 UNITS DAILY 18 mL 11   ??? hydrocortisone 2.5 % cream   0   ??? irbesartan (AVAPRO) 150 MG tablet TAKE ONE TABLET AT BEDTIME 90 tablet 3   ??? ketoconazole (NIZORAL) 2 % cream   0   ??? lancets Misc 1 each by Miscellaneous route Four (4) times a day. 150 each 6   ??? magnesium chloride (SLOW_MAG) 64 mg TbEC Take 128 mg by mouth three (3) times a day (at 6am, noon and 6pm).      ??? metoprolol succinate (TOPROL-XL) 200 MG 24 hr tablet Take 200 mg in the morning and 100 mg in the evening 135 each 3   ??? multivitamin capsule Take 1 capsule by mouth daily.     ??? MYFORTIC 180 mg EC tablet TAKE 3 TABLETS (540 MG) BY MOUTH TWICE DAILY 180 tablet 35   ??? pravastatin (PRAVACHOL) 20 MG tablet TAKE ONE TABLET BY MOUTH EVERY DAY 90 each 3   ??? PROGRAF 1 mg capsule TAKE 3 CAPSULES (3 MG) BY MOUTH TWICE DAILY 180 capsule 11   ??? traMADol (ULTRAM) 50 mg tablet Take 1 tablet (50 mg total) by mouth every six (6) hours as needed for pain. 30 tablet 2   ??? warfarin (COUMADIN) 2.5 MG tablet 2.5 mg. mondays  5   ??? warfarin (COUMADIN) 5 MG tablet Take 5 mg every day except mondays (Patient taking differently: 5 days a week) 30 tablet 6   ??? blood-glucose meter kit Use as instructed. 1 each 0   ??? chlorthalidone (HYGROTON) 25 MG tablet TAKE ONE TABLET BY MOUTH EVERY DAY (Patient not taking: Reported on 04/24/2018) 60 tablet 5     No current facility-administered medications for this visit.      Facility-Administered Medications Ordered in Other Visits   Medication Dose Route Frequency Provider Last Rate Last Dose   ??? bevacizumab    PRN (once a day) Melvyn Novas, MD   2.5 mg at 01/28/17 1610       Social History: reviewed: his wife was kidney donor. rare etoh. no cig. 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     Other past medical history, medications, allergies and problem list are reviewed in the medical record    REVIEW OF SYSTEMS: Pertinent positives and negatives as in HPI, otherwise, all other systems reviewed are negative.  OBJECTIVE -- my repeat BP 120/70  Vitals:    04/24/18 1109   BP: 117/68   Pulse: 70   Weight: (!) 120.2 kg (265 lb)   Height: 182.9 cm (6' 0.01)     Body mass index is 35.93 kg/m??.    BP Readings from Last 3 Encounters:   04/24/18 117/68   04/21/18 98/68   04/21/18 114/58     Wt Readings from Last 3 Encounters:   04/24/18 (!) 120.2 kg (265 lb)   04/21/18 (!) 118.8 kg (262 lb)   04/21/18 (!) 121.1 kg (267 lb)     PSYCH: calm, cooperative with full affect  EYES: eomi, sclera anicteric  NECK: thyroid normal size, no nodules  CV: sinus rate, reg rhythm, neither DP nor PT palpable   LUNG: CTA throughout  LE: no edema    Pertinent labs:  Office Visit on 04/24/2018 Component Date Value Ref Range Status   ??? Glucose, POC 04/24/2018 221* 65 - 179 mg/dL Final   ??? HGB Z6X, POC 04/24/2018 9.3* <7.0 % Final    A1c Glycemic Goal**        Age Group              <7%                     >=18 years  **Goals should be individualized; more or less stringent A1c glycemic goals may be appropriate for individual patients.      (Adopted from: 2018 ADA Standards of Medical Care In Diabetes)   Point of Care A1c testing is not FDA-approved for the diagnosis of Diabetes.   ??? EST AVERAGE GLUCOSE, POC 04/24/2018 220  mg/dL Final   Office Visit on 04/21/2018   Component Date Value Ref Range Status   ??? Tacrolimus, Timed 04/21/2018 6.3  ng/mL Final   ??? Phosphorus 04/21/2018 3.3  2.9 - 4.7 mg/dL Final   ??? Sodium 09/60/4540 137  135 - 145 mmol/L Final   ??? Potassium 04/21/2018 4.5  3.5 - 5.0 mmol/L Final   ??? Chloride 04/21/2018 98  98 - 107 mmol/L Final   ??? CO2 04/21/2018 30.0  22.0 - 30.0 mmol/L Final   ??? BUN 04/21/2018 48* 7 - 21 mg/dL Final   ??? Creatinine 04/21/2018 1.94* 0.70 - 1.30 mg/dL Final   ??? BUN/Creatinine Ratio 04/21/2018 25   Final   ??? EGFR CKD-EPI Non-African American,* 04/21/2018 34* >=60 mL/min/1.43m2 Final   ??? EGFR CKD-EPI African American, Male 04/21/2018 39* >=60 mL/min/1.82m2 Final   ??? Glucose 04/21/2018 221* 65 - 179 mg/dL Final   ??? Calcium 98/04/9146 9.4  8.5 - 10.2 mg/dL Final   ??? Albumin 82/95/6213 4.1  3.5 - 5.0 g/dL Final   ??? Total Protein 04/21/2018 7.0  6.5 - 8.3 g/dL Final   ??? Total Bilirubin 04/21/2018 0.6  0.0 - 1.2 mg/dL Final   ??? AST 08/65/7846 30  19 - 55 U/L Final   ??? ALT 04/21/2018 23  <50 U/L Final   ??? Alkaline Phosphatase 04/21/2018 87  38 - 126 U/L Final   ??? Anion Gap 04/21/2018 9  7 - 15 mmol/L Final   ??? WBC 04/21/2018 10.2  4.5 - 11.0 10*9/L Final   ??? RBC 04/21/2018 5.41  4.50 - 5.90 10*12/L Final   ??? HGB 04/21/2018 15.3  13.5 - 17.5 g/dL Final   ??? HCT 96/29/5284 47.6  41.0 - 53.0 % Final   ??? MCV 04/21/2018 88.0  80.0 -  100.0 fL Final   ??? MCH 04/21/2018 28.3  26.0 - 34.0 pg Final   ??? MCHC 04/21/2018 32.2  31.0 - 37.0 g/dL Final   ??? RDW 16/03/9603 14.2  12.0 - 15.0 % Final   ??? MPV 04/21/2018 8.8  7.0 - 10.0 fL Final   ??? Platelet 04/21/2018 283  150 - 440 10*9/L Final   ??? Neutrophils % 04/21/2018 80.5  % Final   ??? Lymphocytes % 04/21/2018 11.7  % Final   ??? Monocytes % 04/21/2018 5.8  % Final   ??? Eosinophils % 04/21/2018 0.6  % Final   ??? Basophils % 04/21/2018 0.3  % Final   ??? Absolute Neutrophils 04/21/2018 8.2* 2.0 - 7.5 10*9/L Final   ??? Absolute Lymphocytes 04/21/2018 1.2* 1.5 - 5.0 10*9/L Final   ??? Absolute Monocytes 04/21/2018 0.6  0.2 - 0.8 10*9/L Final   ??? Absolute Eosinophils 04/21/2018 0.1  0.0 - 0.4 10*9/L Final   ??? Absolute Basophils 04/21/2018 0.0  0.0 - 0.1 10*9/L Final   ??? Large Unstained Cells 04/21/2018 1  0 - 4 % Final   ??? Hypochromasia 04/21/2018 Moderate* Not Present Final   ??? PT 04/21/2018 26.3* 10.2 - 13.1 sec Final   ??? INR 04/21/2018 2.25   Final   Clinical Support on 04/04/2018   Component Date Value Ref Range Status   ??? Sodium 04/04/2018 138  135 - 145 mmol/L Final   ??? Potassium 04/04/2018 4.5  3.5 - 5.0 mmol/L Final   ??? Chloride 04/04/2018 97* 98 - 107 mmol/L Final   ??? CO2 04/04/2018 32.0* 22.0 - 30.0 mmol/L Final   ??? Anion Gap 04/04/2018 9  7 - 15 mmol/L Final   ??? BUN 04/04/2018 49* 7 - 21 mg/dL Final   ??? Creatinine 04/04/2018 1.62* 0.70 - 1.30 mg/dL Final   ??? BUN/Creatinine Ratio 04/04/2018 30   Final   ??? EGFR CKD-EPI Non-African American,* 04/04/2018 42* >=60 mL/min/1.70m2 Final   ??? EGFR CKD-EPI African American, Male 04/04/2018 49* >=60 mL/min/1.31m2 Final   ??? Glucose 04/04/2018 101  65 - 179 mg/dL Final   ??? Calcium 54/02/8118 9.3  8.5 - 10.2 mg/dL Final   ??? Donor ID 14/78/2956 [id number]   Final   ??? Donor HLA-A Antigen #1 04/04/2018 A31   Final   ??? Anti-Donor HLA-A #1 MFI 04/04/2018 0  <1000 MFI Final   ??? Donor HLA-B Antigen #1 04/04/2018 B39   Final   ??? Anti-Donor HLA-B #1 MFI 04/04/2018 65  <1000 MFI Final   ??? Donor HLA-B Antigen #2 04/04/2018 B62 Final   ??? Anti-Donor HLA-B #2 MFI 04/04/2018 0  <1000 MFI Final   ??? Donor HLA-C Antigen #1 04/04/2018 C4   Final   ??? Anti-Donor HLA-C #1 MFI 04/04/2018 0  <1000 MFI Final   ??? Donor HLA-C Antigen #2 04/04/2018 C12   Final   ??? Anti-Donor HLA-C #2 MFI 04/04/2018 0  <1000 MFI Final   ??? Donor HLA-DR Antigen #1 04/04/2018 DR1   Final   ??? Anti-Donor HLA-DR #1 MFI 04/04/2018 0  <1000 MFI Final   ??? Donor HLA-DR Antigen #2 04/04/2018 DR4   Final   ??? Anti-Donor HLA-DR #2 MFI 04/04/2018 0  <1000 MFI Final   ??? Donor DRw Antigen #1 04/04/2018 DR53   Final   ??? Anti-Donor DRw #1 MFI 04/04/2018 13  <1000 MFI Final   ??? Donor HLA-DQB Antigen #1 04/04/2018 DQ5   Final   ??? Anti-Donor HLA-DQB #1 MFI  04/04/2018 17  <1000 MFI Final   ??? Donor HLA-DQB Antigen #2 04/04/2018 DQ7   Final   ??? Anti-Donor HLA-DQB #2 MFI 04/04/2018 99  <1000 MFI Final   ??? DSA Comment 04/04/2018    Final    Comment: The HLA antigens listed are donor antigens and the corresponding MFI value.  MFI values >= 1000 are considered positive.  A negative result does not exclude the presence of low level DSA.  Blank donor antigen and MFI fields indicate unavailable donor HLA   type or lack of appropriate single antigen bead to determine DSA.  As such, the presence or absence of DSA to the locus cannot be confirmed.     Donor specific antibody (DSA) testing is performed with a  solid phase multiplex single antigen bead array method.  All procedures and reagents have been validated and performance characteristics determined by the [facility].    Certain of these tests have not been cleared/approved by the U.S. Food and Drug Administration (FDA).  The FDA has determined that such approval/clearance is not necessary because this laboratory is certified under the Clinical Laboratory Improvements   Amendments to perform high complexity testing.  This test is used for clinical purposes.  It                            should not be regarded as investigational or for research.  All HLA typings are performed using molecular methodologies.  Details of test procedures may be   obtained by calling [phone].  HLA-A, B, C, DR, and DQ results are serological equivalents of the molecular type.   ??? HLA Class 1 Antibody Result 04/04/2018 Negative   Final   ??? HLA Class 1 Antibody Comment 04/04/2018    Final    All procedures and reagents have been validated and performance characteristics determined by the [facility]. Certain of these tests have not been cleared/approved by the U.S. Food and Drug Administration (FDA). The FDA has determined   that such approval/clearance is not necessary because this laboratory is certified under the Clinical Laboratory Improvement Amendments to perform high complexity testing. This test is used for clinical purposes. It should not be regarded as   investigational or for research. All HLA typings performed using molecular methodologies. Details of test procedures may be obtained by calling [phone]. HLA-A, B, C, DR, and DQ results are serological equivalents of the molecular type.  MFI values => 1000 are considered positive.  HLA Class I antibody testing is performed with a  solid phase multiplex single antigen bead array method.  A negative result does not exclude the presence of low level antibody.   ??? HLA Class 2 Antibody Result 04/04/2018 Negative   Final   ??? HLA Class 2 Antibody Comment 04/04/2018    Final    All procedures and reagents have been validated and performance characteristics determined by the [facility]. Certain of these tests have not been cleared/approved by the U.S. Food and Drug Administration (FDA). The FDA has determined   that such approval/clearance is not necessary because this laboratory is certified under the Clinical Laboratory Improvement Amendments to perform high complexity testing. This test is used for clinical purposes. It should not be regarded as   investigational or for research. All HLA typings performed using molecular methodologies. Details of test procedures may be obtained by calling [phone]. HLA-A, B, C, DR, and DQ results are serological  equivalents of the molecular type.  MFI values => 1000 are considered positive.  HLA Class I antibody testing is performed with a  solid phase multiplex single antigen bead array method.  A negative result does not exclude the presence of low level antibody.   Clinical Support on 03/31/2018   Component Date Value Ref Range Status   ??? Sodium 03/31/2018 136  135 - 145 mmol/L Final   ??? Potassium 03/31/2018 4.4  3.5 - 5.0 mmol/L Final   ??? Chloride 03/31/2018 94* 98 - 107 mmol/L Final   ??? CO2 03/31/2018 28.0  22.0 - 30.0 mmol/L Final   ??? Anion Gap 03/31/2018 14  7 - 15 mmol/L Final   ??? BUN 03/31/2018 66* 7 - 21 mg/dL Final   ??? Creatinine 03/31/2018 1.95* 0.70 - 1.30 mg/dL Final   ??? BUN/Creatinine Ratio 03/31/2018 34   Final   ??? EGFR CKD-EPI Non-African American,* 03/31/2018 34* >=60 mL/min/1.71m2 Final   ??? EGFR CKD-EPI African American, Male 03/31/2018 39* >=60 mL/min/1.69m2 Final   ??? Glucose 03/31/2018 217* 65 - 179 mg/dL Final   ??? Calcium 32/44/0102 9.1  8.5 - 10.2 mg/dL Final   Office Visit on 03/27/2018   Component Date Value Ref Range Status   ??? Creat U 03/27/2018 139.0  Undefined mg/dL Final   ??? Protein, Ur 03/27/2018 5.1  Undefined mg/dL Final    This test was developed and its performance characteristics determined by the [facility] of the [employer] [employer]. This test has not been cleared or approved by the FDA. The laboratory is regulated under CAP and CLIA as qualified to perform high-complexity testing. This test is to be used for clinical purposes and should not be regarded as investigational or for research. Results should be interpreted in context with other laboratory and clinical data.   ??? Protein/Creatinine Ratio, Urine 03/27/2018 0.037  Undefined Final   ??? Case Report 03/27/2018 Final                    Value:Non-gynecologic Cytology                          Case: [id number]                                 Authorizing Provider:  Amy Vinnie Langton, MD    Collected:           03/27/2018 1158              Ordering Location:     [facility]  Received:            03/28/2018 0930                                     [facility]                                                                            [place]  Pathologist:           Sheran Lawless, MD                                                        Specimen:    Urine clean catch, voided decoy                                                           ??? Final Diagnosis 03/27/2018    Final                    Value:This result contains rich text formatting which cannot be displayed here.   ??? Clinical History 03/27/2018    Final                    Value:This result contains rich text formatting which cannot be displayed here.   ??? Gross Description 03/27/2018    Final                    Value:This result contains rich text formatting which cannot be displayed here.   ??? Microscopic Description 03/27/2018    Final                    Value:This result contains rich text formatting which cannot be displayed here.   ??? Specimen Adequacy 03/27/2018 Satisfactory for evaluation   Final   ??? Disclaimer 03/27/2018    Final                    Value:This result contains rich text formatting which cannot be displayed here.   ??? Spec Gravity/POC 03/27/2018 1.020  1.003 - 1.030 Final   ??? PH/POC 03/27/2018 5.5  5.0 - 9.0 Final   ??? Leuk Esterase/POC 03/27/2018 Negative  Negative Final   ??? Nitrite/POC 03/27/2018 Negative  Negative Final   ??? Protein/POC 03/27/2018 Negative  Negative Final   ??? UA Glucose/POC 03/27/2018 Trace* Negative Final   ??? Ketones, POC 03/27/2018 Negative  Negative Final   ??? Bilirubin/POC 03/27/2018 Negative  Negative Final   ??? Blood/POC 03/27/2018 Negative  Negative Final   ??? Urobilinogen/POC 03/27/2018 0.2  0.2 - 1.0 mg/dL Final   Appointment on 03/27/2018   Component Date Value Ref Range Status   ??? Sodium 03/27/2018 135  135 - 145 mmol/L Final   ??? Potassium 03/27/2018 4.8  3.5 - 5.0 mmol/L Final   ??? Chloride 03/27/2018 92* 98 - 107 mmol/L Final   ??? CO2 03/27/2018 32.0* 22.0 - 30.0 mmol/L Final   ??? BUN 03/27/2018 56* 7 - 21 mg/dL Final   ??? Creatinine 03/27/2018 2.11* 0.70 - 1.30 mg/dL Final   ??? BUN/Creatinine Ratio 03/27/2018 27   Final   ??? EGFR CKD-EPI Non-African American,* 03/27/2018 31* >=60 mL/min/1.7m2 Final   ??? EGFR CKD-EPI African American, Male 03/27/2018 36* >=60 mL/min/1.64m2 Final   ??? Glucose 03/27/2018 346* 65 - 179 mg/dL Final   ??? Calcium 06/06/7251 9.9  8.5 - 10.2 mg/dL Final   ??? Albumin 66/44/0347 4.0  3.5 - 5.0 g/dL Final   ???  Total Protein 03/27/2018 7.0  6.5 - 8.3 g/dL Final   ??? Total Bilirubin 03/27/2018 0.9  0.0 - 1.2 mg/dL Final   ??? AST 02/72/5366 27  19 - 55 U/L Final   ??? ALT 03/27/2018 28  19 - 72 U/L Final   ??? Alkaline Phosphatase 03/27/2018 98  38 - 126 U/L Final   ??? Anion Gap 03/27/2018 11  7 - 15 mmol/L Final   ??? Phosphorus 03/27/2018 3.0  2.9 - 4.7 mg/dL Final   ??? PT 44/08/4740 36.2* 10.2 - 13.1 sec Final   ??? INR 03/27/2018 3.08   Final   ??? Tacrolimus, Timed 03/27/2018 7.5  ng/mL Final   ??? WBC 03/27/2018 8.2  4.5 - 11.0 10*9/L Final   ??? RBC 03/27/2018 5.46  4.50 - 5.90 10*12/L Final   ??? HGB 03/27/2018 15.6  13.5 - 17.5 g/dL Final   ??? HCT 59/56/3875 47.8  41.0 - 53.0 % Final   ??? MCV 03/27/2018 87.5  80.0 - 100.0 fL Final   ??? MCH 03/27/2018 28.5  26.0 - 34.0 pg Final   ??? MCHC 03/27/2018 32.5  31.0 - 37.0 g/dL Final   ??? RDW 64/33/2951 14.0  12.0 - 15.0 % Final   ??? MPV 03/27/2018 8.0  7.0 - 10.0 fL Final   ??? Platelet 03/27/2018 247  150 - 440 10*9/L Final   ??? Neutrophils % 03/27/2018 80.6  % Final   ??? Lymphocytes % 03/27/2018 11.2  % Final   ??? Monocytes % 03/27/2018 4.8  % Final   ??? Eosinophils % 03/27/2018 0.8  % Final ??? Basophils % 03/27/2018 1.0  % Final   ??? Absolute Neutrophils 03/27/2018 6.6  2.0 - 7.5 10*9/L Final   ??? Absolute Lymphocytes 03/27/2018 0.9* 1.5 - 5.0 10*9/L Final   ??? Absolute Monocytes 03/27/2018 0.4  0.2 - 0.8 10*9/L Final   ??? Absolute Eosinophils 03/27/2018 0.1  0.0 - 0.4 10*9/L Final   ??? Absolute Basophils 03/27/2018 0.1  0.0 - 0.1 10*9/L Final   ??? Large Unstained Cells 03/27/2018 2  0 - 4 % Final   ??? Hypochromasia 03/27/2018 Slight* Not Present Final     Lab Results   Component Value Date    Hemoglobin A1C 8.8 (H) 01/23/2018    Hemoglobin A1C 9.4 (H) 11/27/2017    Hemoglobin A1C 8.9 (H) 10/29/2017    Hemoglobin A1C 9.1 (H) 09/30/2017    Hemoglobin A1C 6.7 (H) 08/04/2014    Hemoglobin A1C 7.5 07/20/2014    Hemoglobin A1C 7.1 (H) 07/04/2014    Hemoglobin A1C 8.0 (H) 11/23/2013    HB A1C, RAP/HGATE 6.7 (H) 04/07/2012    HB A1C, RAP/HGATE 7.4 (H) 03/30/2011    HGB A1C, POC 9.3 (H) 04/24/2018    HGB A1C, POC 8.0 (H) 10/03/2015    Hemoglobin A1c 7.6 (H) 05/17/2014     No components found for: SODIUM  Lab Results   Component Value Date    K 4.5 04/21/2018     Lab Results   Component Value Date    CREATININE 1.94 (H) 04/21/2018     Lab Results   Component Value Date    PTH 192.3 (H) 11/27/2017    CALCIUM 9.4 04/21/2018     Lab Results   Component Value Date    ALT 23 04/21/2018    ALT 22 04/21/2015     Lab Results   Component Value Date    CHOL 130 01/23/2018     Lab Results  Component Value Date    LDL 37 (L) 01/23/2018     Lab Results   Component Value Date    HDL 30 (L) 01/23/2018     Lab Results   Component Value Date    TRIG 314 (H) 01/23/2018     Lab Results   Component Value Date    TSH 2.202 07/02/2017    TSH 1.840 07/13/2015    TSH 1.420 07/09/2015     Lab Results   Component Value Date    TSH 2.202 07/02/2017     No results found for: CBC  No results found for: VITAMINB12  Lab Results   Component Value Date    VITD2 <5 08/17/2015    VITD3 42 08/17/2015    VITDTOTAL 33.8 03/01/2017     No results found for: Christa See, MSHCGMOM, MALBCRERAT  Lab Results   Component Value Date    Protein, Ur 5.1 03/27/2018    Protein, Ur 15 08/05/2014    Protein/Creatinine Ratio, Urine 0.037 03/27/2018    Protein/Creatinine Ratio, Urine 0.116 08/05/2014       IMAGING:

## 2018-04-24 NOTE — Unmapped (Signed)
Patient notified and voiced understanding.

## 2018-04-24 NOTE — Unmapped (Addendum)
Glucoses in general very well controlled on u500 insulin and Trulicity 0.75mg  weekly.   Continue to watch portion size and carb intake.     (9-10am) Bfast DIAL UP TO 105 UNITS (21 CLICKS)   ????????????????????????Reduce to 70 units if premeal glucose 75-100 (14 clicks)  ????????????????????????Reduce 60 units if glucose <??75 AND you are getting ready to eat a full meal (12 clicks)  (2pm) Lunch DIAL UP TO 55 UNITS (11 CLICKS)  Try reducing to 6 clicks with PNButter sandwich.  (7ish) Dinner: DIAL UP TO 40 UNITS (8 CLICKS)  For seafood, reduce to 30units (6 clicks)  ????????????????????????+45 ????units for glucose 201- 300 (9 clicks)  ????????????????????????+50 ????units 301- 400  (10 clicks)  If glucose <??100 at meal, reduce dose by 75%, and if glucose is <??75, reduce dose 50%.

## 2018-04-24 NOTE — Unmapped (Signed)
-----   Message from Sharlee Blew, MD sent at 04/24/2018  6:25 AM EST -----  Plesase call and let aptient know creatinine has bumped again.  Would like repeat in a week; forwarded to Dr. Arvin Collard.    [name], M.D.     [facility] Internal Medicine at [facility]   [address]  [address]  [place], [place]  [address]  [phone]

## 2018-04-24 NOTE — Unmapped (Signed)
[employer] uploaded to [employer]. POC glucose and A1C done today. PP 0800. 221 mg/dL.

## 2018-04-30 ENCOUNTER — Ambulatory Visit: Admit: 2018-04-30 | Discharge: 2018-04-30 | Payer: MEDICARE

## 2018-04-30 ENCOUNTER — Institutional Professional Consult (permissible substitution): Admit: 2018-04-30 | Discharge: 2018-04-30 | Payer: MEDICARE

## 2018-04-30 DIAGNOSIS — I4891 Unspecified atrial fibrillation: Secondary | ICD-10-CM

## 2018-04-30 DIAGNOSIS — Z940 Kidney transplant status: Principal | ICD-10-CM

## 2018-04-30 DIAGNOSIS — R002 Palpitations: Principal | ICD-10-CM

## 2018-04-30 LAB — RENAL FUNCTION PANEL
ALBUMIN: 3.9 g/dL (ref 3.5–5.0)
ANION GAP: 11 mmol/L (ref 7–15)
CALCIUM: 9.3 mg/dL (ref 8.5–10.2)
CHLORIDE: 96 mmol/L — ABNORMAL LOW (ref 98–107)
CO2: 28 mmol/L (ref 22.0–30.0)
CREATININE: 1.59 mg/dL — ABNORMAL HIGH (ref 0.70–1.30)
EGFR CKD-EPI AA MALE: 50 mL/min/{1.73_m2} — ABNORMAL LOW (ref >=60)
EGFR CKD-EPI NON-AA MALE: 43 mL/min/{1.73_m2} — ABNORMAL LOW (ref >=60)
GLUCOSE RANDOM: 315 mg/dL — ABNORMAL HIGH (ref 65–179)
PHOSPHORUS: 3.1 mg/dL (ref 2.9–4.7)
POTASSIUM: 5 mmol/L (ref 3.5–5.0)
SODIUM: 135 mmol/L (ref 135–145)

## 2018-04-30 LAB — POTASSIUM: Potassium:SCnc:Pt:Ser/Plas:Qn:: 5

## 2018-04-30 NOTE — Unmapped (Signed)
Labs drawn

## 2018-05-15 NOTE — Unmapped (Signed)
Abstraction Result Flowsheet Data    Reason for Encounter  Reason for Encounter: Outreach  Primary Reason for Call: AWV  Scheduling Outcome: Left message  Upfront: No

## 2018-05-15 NOTE — Unmapped (Signed)
[facility] Pharm[facility] Refill Coordination Note    Specialty Medication(s) to be Shipped:   Transplant: Myfortic 180mg  and Prograf 1mg      David Ellis, DOB: [birth year]  Phone: [phone] (home) [phone] (work)    All above HIPAA information was verified with patient.     Completed refill call assessment today to schedule patient's medication shipment from the [facility] [phone]).       Specialty medication(s) and dose(s) confirmed: Regimen is correct and unchanged.   Changes to medications: David Ellis reports no changes reported at this time.  Changes to insurance: No  Questions for the pharmacist: No    The patient will receive a drug information handout for each medication shipped and additional FDA Medication Guides as required.      DISEASE/MEDICATION-SPECIFIC INFORMATION        N/A    ADHERENCE     Medication Adherence    Patient reported X missed doses in the last month:  0  Specialty Medication:  Myfortic 180mg  & Prograf 1mg    Patient is on additional specialty medications:  No  Patient is on more than two specialty medications:  No  Any gaps in refill history greater than 2 weeks in the last 3 months:  no  Demonstrates understanding of importance of adherence:  yes  Informant:  patient  Reliability of informant:  reliable      Adherence tools used:  patient uses a pill box to manage medications      Support network for adherence:  family member      Confirmed plan for next specialty medication refill:  delivery by pharmacy          Refill Coordination    Has the Patients' Contact Information Changed:  No  Is the Shipping Address Different:  No         MEDICARE PART B DOCUMENTATION     Myfortic 180mg : Patient has 10 days worth of tablets on hand.  Prograf 1mg : Patient has 10 days worth of capsules on hand.    SHIPPING     Shipping address confirmed in Epic.     Delivery Scheduled: Yes, Expected medication delivery date: 05/22/2018 via UPS or courier.     Medication will be delivered via UPS to the home address in [employer] [place].    [name]   [facility] Specialty Technician

## 2018-05-20 ENCOUNTER — Institutional Professional Consult (permissible substitution): Admit: 2018-05-20 | Discharge: 2018-05-21 | Payer: MEDICARE

## 2018-05-20 DIAGNOSIS — Z940 Kidney transplant status: Secondary | ICD-10-CM

## 2018-05-20 DIAGNOSIS — I2699 Other pulmonary embolism without acute cor pulmonale: Principal | ICD-10-CM

## 2018-05-20 LAB — RENAL FUNCTION PANEL
ALBUMIN: 3.8 g/dL (ref 3.5–5.0)
BLOOD UREA NITROGEN: 46 mg/dL — ABNORMAL HIGH (ref 7–21)
BUN / CREAT RATIO: 29
CALCIUM: 9.4 mg/dL (ref 8.5–10.2)
CHLORIDE: 100 mmol/L (ref 98–107)
CO2: 24 mmol/L (ref 22.0–30.0)
CREATININE: 1.6 mg/dL — ABNORMAL HIGH (ref 0.70–1.30)
EGFR CKD-EPI AA MALE: 50 mL/min/{1.73_m2} — ABNORMAL LOW (ref >=60)
GLUCOSE RANDOM: 173 mg/dL (ref 65–179)
PHOSPHORUS: 3.6 mg/dL (ref 2.9–4.7)
POTASSIUM: 5 mmol/L (ref 3.5–5.0)
SODIUM: 136 mmol/L (ref 135–145)

## 2018-05-20 LAB — BUN / CREAT RATIO: Urea nitrogen/Creatinine:MRto:Pt:Ser/Plas:Qn:: 29

## 2018-05-20 NOTE — Unmapped (Signed)
Labs drawn per providers.

## 2018-05-21 MED FILL — PROGRAF 1 MG CAPSULE: 30 days supply | Qty: 180 | Fill #4

## 2018-05-21 MED FILL — MYFORTIC 180 MG TABLET,DELAYED RELEASE: 30 days supply | Qty: 180 | Fill #4

## 2018-05-21 MED FILL — PROGRAF 1 MG CAPSULE: 30 days supply | Qty: 180 | Fill #4 | Status: AC

## 2018-05-21 MED FILL — MYFORTIC 180 MG TABLET,DELAYED RELEASE: 30 days supply | Qty: 180 | Fill #4 | Status: AC

## 2018-05-30 NOTE — Unmapped (Signed)
I think it is clinic collect.

## 2018-05-30 NOTE — Unmapped (Signed)
Yes, [facility] in [place].

## 2018-05-30 NOTE — Unmapped (Signed)
Ok.  Is this nephrology at [facility] (so lab collect) or elsewhere?    [name], M.D.     [facility] Internal Medicine at [facility]   [address]  [address]  [place], [place]  [address]  [phone]

## 2018-05-30 NOTE — Unmapped (Signed)
Patient is due for INR (coumadin clinic visit) by 05/21/18.  Please call patient to schedule.

## 2018-05-30 NOTE — Unmapped (Signed)
If it's elsewhere (not this lab), then it is lab collect.  More importantly, is it at [facility] or elsewhere?    [name], M.D.     [facility] Internal Medicine at [facility]   [address]  [address]  [place], [place]  [address]  [phone]

## 2018-05-30 NOTE — Unmapped (Signed)
Contacted patient to schedule INR check. He states he did, at Nephrology in [place]. Per chart, INR specimen was canceled for insufficient quantity. Also, Tacrolimus level was not drawn. He requests you place six months or a year of standing orders for these tests to be drawn at Nephrology. He plans on going next week for redraw.

## 2018-05-31 NOTE — Unmapped (Signed)
New labs ordered as lab collect.    [name], M.D.     [facility] Internal Medicine at [facility]   [address]  [address]  [place], [place]  [address]  [phone]

## 2018-06-03 ENCOUNTER — Institutional Professional Consult (permissible substitution): Admit: 2018-06-03 | Discharge: 2018-06-04 | Payer: MEDICARE

## 2018-06-03 DIAGNOSIS — T8610 Unspecified complication of kidney transplant: Secondary | ICD-10-CM

## 2018-06-03 DIAGNOSIS — I2699 Other pulmonary embolism without acute cor pulmonale: Principal | ICD-10-CM

## 2018-06-03 NOTE — Unmapped (Signed)
Labs drawn per Dr. Arvin Collard

## 2018-06-04 LAB — TACROLIMUS BLOOD: Lab: 10.7

## 2018-06-05 LAB — HLA DS POST TRANSPLANT
ANTI-DONOR DRW #2 MFI: 3 MFI
ANTI-DONOR HLA-A #1 MFI: 5 MFI
ANTI-DONOR HLA-B #1 MFI: 64 MFI
ANTI-DONOR HLA-C #1 MFI: 0 MFI
ANTI-DONOR HLA-C #2 MFI: 0 MFI
ANTI-DONOR HLA-DQB #1 MFI: 22 MFI
ANTI-DONOR HLA-DQB #2 MFI: 79 MFI
ANTI-DONOR HLA-DR #1 MFI: 0 MFI
ANTI-DONOR HLA-DR #2 MFI: 0 MFI

## 2018-06-05 LAB — HLA CL2 AB COMMENT: Lab: 0

## 2018-06-05 LAB — HLA CLASS 1 ANTIBODY RESULT: Lab: NEGATIVE

## 2018-06-05 LAB — DONOR HLA-B ANTIGEN #2

## 2018-06-05 LAB — FSAB CLASS 2 ANTIBODY SPECIFICITY

## 2018-06-09 NOTE — Unmapped (Signed)
[facility] Specialty Pharmacy Refill and Clinical Coordination Note  Medication(s): myfortic, prograf    NIALL ILLES, DOB: 24-Sep-1945  Phone: [phone] (home) [phone] (work), Alternate phone contact: N/A  Shipping address: [place]  [place] [address]  Phone or address changes today?: No  All above HIPAA information verified.  Insurance changes? No    Completed refill and clinical call assessment today to schedule patient's medication shipment from the [facility] [phone]).      MEDICATION RECONCILIATION    Confirmed the medication and dosage are correct and have not changed: Yes, regimen is correct and unchanged.    Were there any changes to your medication(s) in the past month:  No, there are no changes reported at this time.    ADHERENCE    Is this medicine transplant or covered by Medicare Part B? Yes.    Myfortic 180mg : Patient has 10 days worth of tablets on hand.  Prograf 1mg : Patient has 10 days worth of capsules on hand.    Did you miss any doses in the past 4 weeks? No missed doses reported.  Adherence counseling provided? Not needed     SIDE EFFECT MANAGEMENT    Are you tolerating your medication?:  Jahbari reports tolerating the medication.  Side effect management discussed: None      Therapy is appropriate and should be continued.    Evidence of clinical benefit: Do you feel that that the medication is helping? Yes      FINANCIAL/SHIPPING    Delivery Scheduled: Yes, Expected medication delivery date: 06/13/2018     Medication will be delivered via UPS to the home address in [facility].    Additional medications refilled: No additional medications/refills needed at this time.    The patient will receive a drug information handout for each medication shipped and additional FDA Medication Guides as required.      Delois did not have any additional questions at this time.    Delivery address confirmed in Epic.     We will follow up with patient monthly for standard refill processing and delivery.      Thank you,  [name]   [facility] Specialty Pharmacist

## 2018-06-10 ENCOUNTER — Ambulatory Visit: Admit: 2018-06-10 | Discharge: 2018-06-11 | Payer: MEDICARE

## 2018-06-10 DIAGNOSIS — E1169 Type 2 diabetes mellitus with other specified complication: Principal | ICD-10-CM

## 2018-06-10 DIAGNOSIS — I2699 Other pulmonary embolism without acute cor pulmonale: Secondary | ICD-10-CM

## 2018-06-10 DIAGNOSIS — T8610 Unspecified complication of kidney transplant: Secondary | ICD-10-CM

## 2018-06-10 LAB — CBC W/ AUTO DIFF
BASOPHILS RELATIVE PERCENT: 0.7 %
EOSINOPHILS ABSOLUTE COUNT: 0.1 10*9/L (ref 0.0–0.4)
HEMATOCRIT: 47.2 % (ref 41.0–53.0)
LARGE UNSTAINED CELLS: 1 % (ref 0–4)
LYMPHOCYTES ABSOLUTE COUNT: 0.9 10*9/L — ABNORMAL LOW (ref 1.5–5.0)
LYMPHOCYTES RELATIVE PERCENT: 11.5 %
MEAN CORPUSCULAR HEMOGLOBIN CONC: 32 g/dL (ref 31.0–37.0)
MEAN CORPUSCULAR HEMOGLOBIN: 28.3 pg (ref 26.0–34.0)
MEAN CORPUSCULAR VOLUME: 88.5 fL (ref 80.0–100.0)
MEAN PLATELET VOLUME: 8.6 fL (ref 7.0–10.0)
MONOCYTES ABSOLUTE COUNT: 0.5 10*9/L (ref 0.2–0.8)
MONOCYTES RELATIVE PERCENT: 6.7 %
NEUTROPHILS ABSOLUTE COUNT: 5.8 10*9/L (ref 2.0–7.5)
PLATELET COUNT: 278 10*9/L (ref 150–440)
RED BLOOD CELL COUNT: 5.33 10*12/L (ref 4.50–5.90)
RED CELL DISTRIBUTION WIDTH: 14.1 % (ref 12.0–15.0)
WBC ADJUSTED: 7.4 10*9/L (ref 4.5–11.0)

## 2018-06-10 LAB — INR: Lab: 2.49

## 2018-06-10 LAB — COMPREHENSIVE METABOLIC PANEL
ALBUMIN: 3.9 g/dL (ref 3.5–5.0)
ALKALINE PHOSPHATASE: 74 U/L (ref 38–126)
ALT (SGPT): 25 U/L (ref <50)
ANION GAP: 11 mmol/L (ref 7–15)
AST (SGOT): 32 U/L (ref 19–55)
BILIRUBIN TOTAL: 0.8 mg/dL (ref 0.0–1.2)
BLOOD UREA NITROGEN: 46 mg/dL — ABNORMAL HIGH (ref 7–21)
BUN / CREAT RATIO: 27
CHLORIDE: 97 mmol/L — ABNORMAL LOW (ref 98–107)
CO2: 30 mmol/L (ref 22.0–30.0)
CREATININE: 1.68 mg/dL — ABNORMAL HIGH (ref 0.70–1.30)
EGFR CKD-EPI AA MALE: 47 mL/min/{1.73_m2} — ABNORMAL LOW (ref >=60)
EGFR CKD-EPI NON-AA MALE: 41 mL/min/{1.73_m2} — ABNORMAL LOW (ref >=60)
POTASSIUM: 4.7 mmol/L (ref 3.5–5.0)
PROTEIN TOTAL: 7 g/dL (ref 6.5–8.3)
SODIUM: 138 mmol/L (ref 135–145)

## 2018-06-10 LAB — LIPID PANEL
CHOLESTEROL/HDL RATIO SCREEN: 4.5 (ref <5.0)
CHOLESTEROL: 141 mg/dL (ref 100–199)
LDL CHOLESTEROL CALCULATED: 55 mg/dL — ABNORMAL LOW (ref 60–99)
NON-HDL CHOLESTEROL: 110 mg/dL
TRIGLYCERIDES: 274 mg/dL — ABNORMAL HIGH (ref 1–149)

## 2018-06-10 LAB — PHOSPHORUS: Phosphate:MCnc:Pt:Ser/Plas:Qn:: 3.6

## 2018-06-10 LAB — PROTIME-INR: INR: 2.49

## 2018-06-10 LAB — TACROLIMUS BLOOD: Lab: 7.2

## 2018-06-10 LAB — HEMOGLOBIN A1C: Hemoglobin A1c/Hemoglobin.total:MFr:Pt:Bld:Qn:: 8.1 — ABNORMAL HIGH

## 2018-06-10 LAB — HEMOGLOBIN: Lab: 15.1

## 2018-06-10 LAB — EGFR CKD-EPI AA MALE: Lab: 47 — ABNORMAL LOW

## 2018-06-10 LAB — TRIGLYCERIDES: Triglyceride:MCnc:Pt:Ser/Plas:Qn:: 274 — ABNORMAL HIGH

## 2018-06-12 MED FILL — MYFORTIC 180 MG TABLET,DELAYED RELEASE: 30 days supply | Qty: 180 | Fill #5

## 2018-06-12 MED FILL — PROGRAF 1 MG CAPSULE: 30 days supply | Qty: 180 | Fill #5

## 2018-06-12 MED FILL — PROGRAF 1 MG CAPSULE: 30 days supply | Qty: 180 | Fill #5 | Status: AC

## 2018-06-12 MED FILL — MYFORTIC 180 MG TABLET,DELAYED RELEASE: 30 days supply | Qty: 180 | Fill #5 | Status: AC

## 2018-06-16 NOTE — Unmapped (Signed)
-----   Message from Sharlee Blew, MD sent at 06/15/2018 10:06 PM EST -----  INR ok same dose; repat INR one month.      Sugar is up a bit.    Creatinine stable.    Labs forwarded to nephrology and to endocrinology.    [name], M.D.     [facility] Internal Medicine at [facility]   [address]  [address]  [place], [place]  [address]  [phone]

## 2018-06-17 NOTE — Unmapped (Signed)
Patient notified of results, voiced understanding.  Anticoagulation tracker updated.

## 2018-06-24 NOTE — Unmapped (Signed)
PCP:  David Addison, MD      ASSESSMENT/PLAN: David Ellis is a 73 y.o. 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    1. S/P LURD kidney transplant 2013 with baseline creatinine ~1.4-1.7. Renal function within baseline on most recent labs from 1/7. His tac trough at last visit was slightly supratherapeutic, however has since normalized within goal range of 5-7. His electrolytes are WNL. Urine dipstick negative for proteinuria and most recent UACR was within normal range. Has had 3 prior biopsies all in 2013 revealing moderate to severe arteriosclerosis. The patient has a history of a DQ 7 donor specific antibody noted on 09/17/2011 with all subsequent HLA antibody screens revealing no DSA's. Continue current dose of mycophenolate and tacrolimus. Urine cytology from October 2019 was negative for decoy cells.     3. HTN. Blood pressure within goal today. Asked him to continue to monitor.    4. T2DM. Uncontrolled. Very insulin resistant and has had to be more sedentary over last year or two due to foot wounds and mobility problems. Follows with David Ellis in endocrinology. Goal HbA1c < 8%, last 8.1 on 06/10/2018. Patient instructed to double Trulicity dose and monitor for hypoglycemia - will d/w David Ellis. History of proliferative retinopathy s/p PPV/endolaser treatments, last evaluated by ophthalmology in 01/2018 and has f/u scheduled next month.    5. ASHD/CVA history. Continue pravastatin.     6. A-Fib. Normal sinus rhythm by my exam today. He was scheduled for cardioversion in November 2019, given symptomatic recurrence. Fortunately, patient spontaneously converted prior to procedure and cardioversion was cancelled. Continue to follow with cardiology regularly. Continue coumadin. INR f/b Dr. Dellis Ellis.    7. HM: Had his flu shot and UTD on pneumonia and shinrix. Due for colonoscopy but reportedly GI is leary of stopping anticoagulation so are following cologuard test results. No FH colon CA. Continue biannual follow-up with dermatology re: recurrent basal cell CA.    9. High falls risk: No recurrent falls, admits to using walker regularly.      David Ellis will return to see me in 6 mos, or sooner as indicated. He will follow up with David Ellis in 3 months.   ______________________________________________________________________    HPI: David Ellis is a 73 y.o. man s/p LURD kidney transplant (from his wife, David Ellis) 08/21/2011 and B/L creatinine ~1.4-1.7 who returns for f/u. He has had multiple complications since his transplant including a large, provoked (long car ride) pulmonary embolus requiring thrombolysis (Dec 2014), osteomyelitis of his left foot requiring 4th/5th toe amputation (2016), recurrent basal cell CA, CVA s/p thombolysis (07/2015), nonhealing foot wound on left requiring amputation of left 4th/5th toes). He has significant PAD and neuropathy and has no sensation on either leg below his knees. He also has severe retinopathy and his vision is quite poor so no longer drives.    Since last seen, patient has had no hospitalizations or ED visits. Continues to follow with internal medicine, endocrinology, cardiology. At cardiology visit on 04/21/18, patient was in a-fib complaining of fatigue, palpitations and generalized body weakness. Given symptomatic recurrence, cardiology planned to proceed with cardioversion on 04/30/18. However, on arrival to the hospital patient was in normal sinus rhythm and cardioversion was cancelled.      Patient returns today for follow up visit, doing well. Blood sugars remains labile and difficulty to  control. Currently on Trulicity, which patient has tolerated well with some improvement in blood sugars. Reports episode of hypoglycemia several days ago, with blood sugar recorded in the low 40s with associated lightheadedness and dizziness. Sugar returned to normal after glucose shot and crackers with PB. His only complaint today is developing some mild tinnitus. No associated ear pain or drainage, no sinus pressure or congestion.       He was seen by dermatology this week and underwent cryosurgery to several benign right sided facial lesions. Patient reports increased lower extremity swelling, most notable with left leg which is typical due to history of DVT. He doubled dose of lasix and swelling has since improved. Denies noting any new foot wounds or ulcerations. Neuropathy unchanged.       Otherwise no acute complaints of CP, SOB, cough, palpitations, N/V/D. His edema is pretty much gone he says.    ROS: As per HPI. The remainder of the 10 system review is negative.    PAST MEDICAL HISTORY:  Past Medical History:   Diagnosis Date   ??? Atrial flutter (CMS-HCC)    ??? Diabetes mellitus (CMS-HCC)    ??? Diabetic nephropathy (CMS-HCC)    ??? Diabetic retinopathy (CMS-HCC)    ??? Fractures    ??? Ganglion cyst    ??? Hand injury    ??? Heart disease    ??? Hyperlipidemia    ??? Hypertension    ??? Joint pain    ??? Osteomyelitis (CMS-HCC) June 2016   ??? Pulmonary embolism (CMS-HCC) Dec 2015   ??? 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 Stroke (CMS-HCC)    ??? Tear of meniscus of knee    ??? Transplanted kidney 08/21/2011       ALLERGIES  Levofloxacin; Lidocaine; Penicillins; Egg derived; Enalapril; Epinephrine; Grass pollen-bermuda, standard; Lisinopril; and Mepivacaine hcl    MEDICATIONS:  Current Outpatient Medications   Medication Sig Dispense Refill   ??? aspirin (ECOTRIN) 81 MG tablet Take 81 mg by mouth continuous as needed. Only when flying and/or traveling by car more than 2 hours.     ??? BD INSULIN SYRINGE ULTRA-FINE 0.3 mL 31 gauge x 15/64 Syrg 1 each by Other route Three (3) times a day. 300 Syringe 3   ??? BD ULTRA-FINE NANO PEN NEEDLE 32 gauge x 5/32 Ndle USE UP TO 3 TIMES A DAY 300 each 3   ??? blood sugar diagnostic (FREESTYLE PRECISION NEO STRIPS) Strp Use to test blood sugar 4 times daily. Dx E11.65. 150 each 5   ??? chlorthalidone (HYGROTON) 25 MG tablet TAKE ONE TABLET BY MOUTH EVERY DAY 60 tablet 5   ??? cholecalciferol, vitamin D3, (VITAMIN D3) 2,000 unit Tab Take 2,000 Units by mouth daily.      ??? dulaglutide (TRULICITY) 0.75 mg/0.5 mL injection pen Inject 0.5 mL (0.75 mg total) under the skin once a week. 4 Syringe 11   ??? furosemide (LASIX) 40 MG tablet TAKE 1 TO 2 TABLETS ONCE A DAY 180 tablet 3   ??? gabapentin (NEURONTIN) 300 MG capsule TAKE 1 CAPSULE TWICE DAILY 180 capsule 5   ??? HUMULIN R U-500, CONC, KWIKPEN 500 unit/mL (3 mL) CONCENTRATED injection DIAL TO MAX 100 UNITS BEFORE BREAKFAST, LUNCH, AND DINNER.  INJECT BEFOREMEALS MAX OF 300 UNITS DAILY 18 mL 11   ??? hydrocortisone 2.5 % cream   0   ??? irbesartan (AVAPRO) 150 MG tablet TAKE ONE TABLET AT  BEDTIME 90 tablet 3   ??? ketoconazole (NIZORAL) 2 % cream   0   ??? lancets Misc 1 each by Miscellaneous route Four (4) times a day. 150 each 6   ??? magnesium chloride (SLOW_MAG) 64 mg TbEC Take 128 mg by mouth three (3) times a day (at 6am, noon and 6pm).      ??? metoprolol succinate (TOPROL-XL) 200 MG 24 hr tablet Take 200 mg in the morning and 100 mg in the evening 135 each 3   ??? multivitamin capsule Take 1 capsule by mouth daily.     ??? MYFORTIC 180 mg EC tablet TAKE 3 TABLETS (540 MG) BY MOUTH TWICE DAILY 180 tablet 35   ??? pravastatin (PRAVACHOL) 20 MG tablet TAKE ONE TABLET BY MOUTH EVERY DAY 90 each 3   ??? PROGRAF 1 mg capsule TAKE 3 CAPSULES (3 MG) BY MOUTH TWICE DAILY 180 capsule 11   ??? traMADol (ULTRAM) 50 mg tablet Take 1 tablet (50 mg total) by mouth every six (6) hours as needed for pain. 30 tablet 2   ??? warfarin (COUMADIN) 2.5 MG tablet 2.5 mg. mondays  5   ??? warfarin (COUMADIN) 5 MG tablet Take 5 mg every day except mondays (Patient taking differently: 5 days a week) 30 tablet 6   ??? blood-glucose meter kit Use as instructed. 1 each 0     No current facility-administered medications for this visit.      Facility-Administered Medications Ordered in Other Visits   Medication Dose Route Frequency Provider Last Rate Last Dose   ??? bevacizumab    PRN (once a day) Melvyn Novas, MD   2.5 mg at 01/28/17 0750       PHYSICAL EXAM:  BP 112/72  - Pulse 76  - Temp 36.5 ??C (97.7 ??F) (Temporal)  - Ht 182.9 cm (6' 0.01)  - Wt (!) 120.8 kg (266 lb 6.4 oz)  - BMI 36.12 kg/m??    CONSTITUTIONAL: Alert, well appearing, no distress  CARDIOVASCULAR: Regular, normal S1/S2 heart sounds  PULM: Clear to auscultation bilaterally  MUSCULOSKELETAL: grossly 5/5 strength B/L  EXTREMITIES: trace edema bilaterally -- left is always > right due to h/o DVT  NEUROLOGIC: Non-focal    MEDICAL DECISION MAKING    Results for orders placed or performed in visit on 06/10/18   Comprehensive Metabolic Panel   Result Value Ref Range    Sodium 138 135 - 145 mmol/L    Potassium 4.7 3.5 - 5.0 mmol/L    Chloride 97 (L) 98 - 107 mmol/L    CO2 30.0 22.0 - 30.0 mmol/L    BUN 46 (H) 7 - 21 mg/dL    Creatinine 1.61 (H) 0.70 - 1.30 mg/dL    BUN/Creatinine Ratio 27     EGFR CKD-EPI Non-African American, Male 41 (L) >=60 mL/min/1.22m2    EGFR CKD-EPI African American, Male 47 (L) >=60 mL/min/1.74m2    Glucose 212 (H) 70 - 179 mg/dL    Calcium 8.9 8.5 - 09.6 mg/dL    Albumin 3.9 3.5 - 5.0 g/dL    Total Protein 7.0 6.5 - 8.3 g/dL    Total Bilirubin 0.8 0.0 - 1.2 mg/dL    AST 32 19 - 55 U/L    ALT 25 <50 U/L    Alkaline Phosphatase 74 38 - 126 U/L    Anion Gap 11 7 - 15 mmol/L   Phosphorus Level   Result Value Ref Range    Phosphorus 3.6 2.9 - 4.7 mg/dL  PT-INR   Result Value Ref Range    PT 29.2 (H) 10.2 - 13.1 sec    INR 2.49    Hemoglobin A1c   Result Value Ref Range    Hemoglobin A1C 8.1 (H) 4.8 - 5.6 %    Estimated Average Glucose 186 mg/dL   Lipid Panel   Result Value Ref Range    Triglycerides 274 (H) 1 - 149 mg/dL    Cholesterol 161 096 - 199 mg/dL    HDL 31 (L) 40 - 59 mg/dL    LDL Calculated 55 (L) 60 - 99 mg/dL    VLDL Cholesterol Cal 54.8 (H) 12 - 42 mg/dL    Chol/HDL Ratio 4.5 <0.4    Non-HDL Cholesterol 110 mg/dL    FASTING No    Tacrolimus Level, Timed ([place])   Result Value Ref Range    Tacrolimus, Timed 7.2 ng/mL   CBC w/ Differential   Result Value Ref Range    WBC 7.4 4.5 - 11.0 10*9/L    RBC 5.33 4.50 - 5.90 10*12/L    HGB 15.1 13.5 - 17.5 g/dL    HCT 54.0 98.1 - 19.1 %    MCV 88.5 80.0 - 100.0 fL    MCH 28.3 26.0 - 34.0 pg    MCHC 32.0 31.0 - 37.0 g/dL    RDW 47.8 29.5 - 62.1 %    MPV 8.6 7.0 - 10.0 fL    Platelet 278 150 - 440 10*9/L    Neutrophils % 78.7 %    Lymphocytes % 11.5 %    Monocytes % 6.7 %    Eosinophils % 1.2 %    Basophils % 0.7 %    Absolute Neutrophils 5.8 2.0 - 7.5 10*9/L    Absolute Lymphocytes 0.9 (L) 1.5 - 5.0 10*9/L    Absolute Monocytes 0.5 0.2 - 0.8 10*9/L    Absolute Eosinophils 0.1 0.0 - 0.4 10*9/L    Absolute Basophils 0.1 0.0 - 0.1 10*9/L    Large Unstained Cells 1 0 - 4 %    Hypochromasia Slight (A) Not Present     *Note: Due to a large number of results and/or encounters for the requested time period, some results have not been displayed. A complete set of results can be found in Results Review.        Lab Results   Component Value Date    NA 138 06/10/2018    K 4.7 06/10/2018    CL 97 (L) 06/10/2018    CO2 30.0 06/10/2018    BUN 46 (H) 06/10/2018    CREATININE 1.68 (H) 06/10/2018    GFRAA 51 (L) 10/29/2017    GFRNONAA 42 (L) 10/29/2017    ALBUMIN 3.9 06/10/2018     Lab Results   Component Value Date    WBC 7.4 06/10/2018    HGB 15.1 06/10/2018    HCT 47.2 06/10/2018    PLT 278 06/10/2018     Lab Results   Component Value Date    PTH 192.3 (H) 11/27/2017    CALCIUM 8.9 06/10/2018    PHOS 3.6 06/10/2018       Scribe's Attestation: Trey Sailors, MD obtained and performed the history, physical exam and medical decision making elements that were entered into the chart.  Signed by Jerl Santos, Scribe, on June 26, 2018 at 9:44 AM.    Attending Statement: Documentation assistance provided by the Scribe. I was present during the time the encounter was recorded. The information recorded by  the Scribe was done at my direction and has been reviewed and validated by me.

## 2018-06-26 ENCOUNTER — Ambulatory Visit: Admit: 2018-06-26 | Discharge: 2018-06-27 | Payer: MEDICARE

## 2018-06-26 DIAGNOSIS — N183 Chronic kidney disease, stage 3 (moderate): Principal | ICD-10-CM

## 2018-06-26 DIAGNOSIS — I10 Essential (primary) hypertension: Secondary | ICD-10-CM

## 2018-06-26 DIAGNOSIS — Z940 Kidney transplant status: Secondary | ICD-10-CM

## 2018-06-26 DIAGNOSIS — Z79899 Other long term (current) drug therapy: Secondary | ICD-10-CM

## 2018-07-02 NOTE — Unmapped (Signed)
[facility] Refill Coordination Note    Specialty Medication(s) to be Shipped:   Transplant: Myfortic 180mg  and Prograf 1mg     Other medication(s) to be shipped: none     David Ellis, DOB: 1946/05/31  Phone: [phone] (home) [phone] (work)      All above HIPAA information was verified with patient.     Completed refill call assessment today to schedule patient's medication shipment from the [facility] [phone]).       Specialty medication(s) and dose(s) confirmed: Regimen is correct and unchanged.   Changes to medications: David Ellis reports no changes reported at this time.  Changes to insurance: No  Questions for the pharmacist: No    The patient will receive a drug information handout for each medication shipped and additional FDA Medication Guides as required.      DISEASE/MEDICATION-SPECIFIC INFORMATION        N/A    ADHERENCE     Medication Adherence    Patient reported X missed doses in the last month:  0  Adherence tools used:  patient uses a pill box to manage medications  Support network for adherence:  family member              MEDICARE PART B DOCUMENTATION     Myfortic 180mg : Patient has 8 days worth of tablets on hand.  Prograf 1mg : Patient has 8 days worth of capsules on hand.    SHIPPING     Shipping address confirmed in Epic.     Delivery Scheduled: Yes, Expected medication delivery date: 07/09/18 via UPS or courier.     Medication will be delivered via UPS to the home address in Epic [place].    David Ellis   [facility] Specialty Technician

## 2018-07-08 MED FILL — MYFORTIC 180 MG TABLET,DELAYED RELEASE: 30 days supply | Qty: 180 | Fill #6

## 2018-07-08 MED FILL — MYFORTIC 180 MG TABLET,DELAYED RELEASE: 30 days supply | Qty: 180 | Fill #6 | Status: AC

## 2018-07-08 MED FILL — PROGRAF 1 MG CAPSULE: 30 days supply | Qty: 180 | Fill #6 | Status: AC

## 2018-07-08 MED FILL — PROGRAF 1 MG CAPSULE: 30 days supply | Qty: 180 | Fill #6

## 2018-07-24 ENCOUNTER — Ambulatory Visit: Admit: 2018-07-24 | Discharge: 2018-07-24 | Payer: MEDICARE

## 2018-07-24 DIAGNOSIS — I2699 Other pulmonary embolism without acute cor pulmonale: Secondary | ICD-10-CM

## 2018-07-24 DIAGNOSIS — E1169 Type 2 diabetes mellitus with other specified complication: Principal | ICD-10-CM

## 2018-07-24 DIAGNOSIS — T8610 Unspecified complication of kidney transplant: Secondary | ICD-10-CM

## 2018-07-24 LAB — POTASSIUM: Potassium:SCnc:Pt:Ser/Plas:Qn:: 4.7

## 2018-07-24 LAB — CBC W/ AUTO DIFF
BASOPHILS ABSOLUTE COUNT: 0 10*9/L (ref 0.0–0.1)
BASOPHILS RELATIVE PERCENT: 0.3 %
EOSINOPHILS ABSOLUTE COUNT: 0.1 10*9/L (ref 0.0–0.4)
EOSINOPHILS RELATIVE PERCENT: 1.4 %
HEMATOCRIT: 46.4 % (ref 41.0–53.0)
HEMOGLOBIN: 14.8 g/dL (ref 13.5–17.5)
LARGE UNSTAINED CELLS: 2 % (ref 0–4)
LYMPHOCYTES ABSOLUTE COUNT: 1.1 10*9/L — ABNORMAL LOW (ref 1.5–5.0)
LYMPHOCYTES RELATIVE PERCENT: 14.9 %
MEAN CORPUSCULAR HEMOGLOBIN CONC: 32 g/dL (ref 31.0–37.0)
MEAN CORPUSCULAR HEMOGLOBIN: 28 pg (ref 26.0–34.0)
MEAN CORPUSCULAR VOLUME: 87.6 fL (ref 80.0–100.0)
MEAN PLATELET VOLUME: 9.1 fL (ref 7.0–10.0)
MONOCYTES ABSOLUTE COUNT: 0.5 10*9/L (ref 0.2–0.8)
MONOCYTES RELATIVE PERCENT: 5.9 %
NEUTROPHILS ABSOLUTE COUNT: 5.8 10*9/L (ref 2.0–7.5)
WBC ADJUSTED: 7.6 10*9/L (ref 4.5–11.0)

## 2018-07-24 LAB — LIPID PANEL
CHOLESTEROL: 132 mg/dL (ref 100–199)
HDL CHOLESTEROL: 30 mg/dL — ABNORMAL LOW (ref 40–59)
LDL CHOLESTEROL CALCULATED: 58 mg/dL — ABNORMAL LOW (ref 60–99)
NON-HDL CHOLESTEROL: 102 mg/dL
TRIGLYCERIDES: 219 mg/dL — ABNORMAL HIGH (ref 1–149)
VLDL CHOLESTEROL CAL: 43.8 mg/dL — ABNORMAL HIGH (ref 12–42)

## 2018-07-24 LAB — COMPREHENSIVE METABOLIC PANEL
ALBUMIN: 3.9 g/dL (ref 3.5–5.0)
ALKALINE PHOSPHATASE: 85 U/L (ref 38–126)
ALT (SGPT): 24 U/L (ref <50)
ANION GAP: 12 mmol/L (ref 7–15)
AST (SGOT): 30 U/L (ref 19–55)
BILIRUBIN TOTAL: 0.8 mg/dL (ref 0.0–1.2)
BLOOD UREA NITROGEN: 50 mg/dL — ABNORMAL HIGH (ref 7–21)
BUN / CREAT RATIO: 28
CALCIUM: 9.2 mg/dL (ref 8.5–10.2)
CHLORIDE: 99 mmol/L (ref 98–107)
CO2: 28 mmol/L (ref 22.0–30.0)
CREATININE: 1.77 mg/dL — ABNORMAL HIGH (ref 0.70–1.30)
EGFR CKD-EPI AA MALE: 44 mL/min/{1.73_m2} — ABNORMAL LOW (ref >=60)
EGFR CKD-EPI NON-AA MALE: 38 mL/min/{1.73_m2} — ABNORMAL LOW (ref >=60)
GLUCOSE RANDOM: 237 mg/dL — ABNORMAL HIGH (ref 70–179)
POTASSIUM: 4.7 mmol/L (ref 3.5–5.0)
SODIUM: 139 mmol/L (ref 135–145)

## 2018-07-24 LAB — CHOLESTEROL: Cholesterol:MCnc:Pt:Ser/Plas:Qn:: 132

## 2018-07-24 LAB — ESTIMATED AVERAGE GLUCOSE: Estimated average glucose:MCnc:Pt:Bld:Qn:Estimated from glycated hemoglobin: 186

## 2018-07-24 LAB — EOSINOPHILS ABSOLUTE COUNT: Lab: 0.1

## 2018-07-24 LAB — INR: Lab: 2.06

## 2018-07-24 LAB — PHOSPHORUS: Phosphate:MCnc:Pt:Ser/Plas:Qn:: 4.2

## 2018-07-24 NOTE — Unmapped (Signed)
-----   Message from Amy Vinnie Langton, MD sent at 07/24/2018  3:21 PM EST -----  Thanks! Trending it out, it's not TOO far off -- he was in the 1.6 range in 2018. I'm okay as long as it stays under 1.8.  ----- Message -----  From: Sharlee Blew, MD  Sent: 04/24/2018   6:25 AM EST  To: Lilia Pro, MD, #    Plesase call and let aptient know creatinine has bumped again.  Would like repeat in a week; forwarded to Dr. Arvin Collard.    [name], M.D.     [facility] Internal Medicine at [facility]   [address]  [address]  [place], [place]  [address]  [phone]

## 2018-07-24 NOTE — Unmapped (Unsigned)
Labs drawn per Dr. Dellis Filbert

## 2018-07-24 NOTE — Unmapped (Signed)
Patient notified of results, voiced understanding to recheck labs in one week.  INR was fine, he will repeat that in one month.

## 2018-07-25 LAB — TACROLIMUS BLOOD: Lab: 6.2

## 2018-07-25 MED ORDER — FUROSEMIDE 40 MG TABLET
3 refills | 0 days | Status: CP
Start: 2018-07-25 — End: ?

## 2018-07-26 NOTE — Unmapped (Signed)
Please see refill request. Thanks.

## 2018-07-28 NOTE — Unmapped (Signed)
[facility] Refill Coordination Note    Specialty Medication(s) to be Shipped:   Transplant: Myfortic 180mg  and Prograf 1mg      David Ellis, DOB: Oct 27, 1945  Phone: [phone] (home) [phone] (work)    All above HIPAA information was verified with patient.     Completed refill call assessment today to schedule patient's medication shipment from the [facility] [phone]).       Specialty medication(s) and dose(s) confirmed: Regimen is correct and unchanged.   Changes to medications: David Ellis reports no changes reported at this time.  Changes to insurance: No  Questions for the pharmacist: No    The patient will receive a drug information handout for each medication shipped and additional FDA Medication Guides as required.      DISEASE/MEDICATION-SPECIFIC INFORMATION        N/A    ADHERENCE     Medication Adherence    Patient reported X missed doses in the last month:  0  Specialty Medication:  Myfortic 180mg  & Prograf 1mg    Patient is on additional specialty medications:  No  Patient is on more than two specialty medications:  No  Any gaps in refill history greater than 2 weeks in the last 3 months:  no  Demonstrates understanding of importance of adherence:  yes  Informant:  patient  Reliability of informant:  reliable  Adherence tools used:  patient uses a pill box to manage medications  Support network for adherence:  family member  Confirmed plan for next specialty medication refill:  delivery by pharmacy          Refill Coordination    Has the Patients' Contact Information Changed:  No  Is the Shipping Address Different:  No         MEDICARE PART B DOCUMENTATION     Myfortic 180mg : Patient has 10 days worth of tablets on hand.  Prograf 1mg : Patient has 10 days worth of capsules on hand.    SHIPPING     Shipping address confirmed in Epic.     Delivery Scheduled: Yes, Expected medication delivery date: 08/05/2018 via UPS or courier.     Medication will be delivered via UPS to the home address in Epic [place].    David Ellis P David Ellis   [facility] Specialty Technician

## 2018-07-29 NOTE — Unmapped (Signed)
-----   Message from Sharlee Blew, MD sent at 07/29/2018  5:40 AM EST -----  Call and let aptient know INR is stable;   Re: coumadin, same dose; repeat in a month.    Copies sent to nephrology and endocrinology.    [name], M.D.     [facility] Internal Medicine at [facility]   [address]  [address]  [place], [place]  [address]  [phone]

## 2018-07-29 NOTE — Unmapped (Signed)
Patient advised of result note, expressed understanding and requests you place order in Epic for repeat creatinine.

## 2018-07-29 NOTE — Unmapped (Signed)
Sorry; does he want it here or in [place]?    [name], M.D.     [facility] Internal Medicine at [facility]   [address]  [address]  [place], [place]  [address]  [phone]

## 2018-07-29 NOTE — Unmapped (Signed)
Done.    David Ellis, M.D.     [facility] Internal Medicine at [facility]   [address]  [address]  [place], [place]  [address]  [phone]

## 2018-07-29 NOTE — Unmapped (Signed)
For here. He has a lab draw scheduled for this Thursday.

## 2018-07-31 ENCOUNTER — Ambulatory Visit: Admit: 2018-07-31 | Discharge: 2018-07-31 | Payer: MEDICARE

## 2018-07-31 DIAGNOSIS — R7989 Other specified abnormal findings of blood chemistry: Principal | ICD-10-CM

## 2018-07-31 DIAGNOSIS — H4312 Vitreous hemorrhage, left eye: Principal | ICD-10-CM

## 2018-07-31 DIAGNOSIS — E113593 Type 2 diabetes mellitus with proliferative diabetic retinopathy without macular edema, bilateral: Secondary | ICD-10-CM

## 2018-07-31 DIAGNOSIS — H35373 Puckering of macula, bilateral: Secondary | ICD-10-CM

## 2018-07-31 DIAGNOSIS — Z961 Presence of intraocular lens: Secondary | ICD-10-CM

## 2018-07-31 LAB — BASIC METABOLIC PANEL
ANION GAP: 11 mmol/L (ref 7–15)
BLOOD UREA NITROGEN: 52 mg/dL — ABNORMAL HIGH (ref 7–21)
BUN / CREAT RATIO: 28
CALCIUM: 9.4 mg/dL (ref 8.5–10.2)
CHLORIDE: 96 mmol/L — ABNORMAL LOW (ref 98–107)
CO2: 30 mmol/L (ref 22.0–30.0)
CREATININE: 1.84 mg/dL — ABNORMAL HIGH (ref 0.70–1.30)
EGFR CKD-EPI AA MALE: 42 mL/min/{1.73_m2} — ABNORMAL LOW (ref >=60)
EGFR CKD-EPI NON-AA MALE: 36 mL/min/{1.73_m2} — ABNORMAL LOW (ref >=60)
GLUCOSE RANDOM: 275 mg/dL — ABNORMAL HIGH (ref 70–179)
SODIUM: 137 mmol/L (ref 135–145)

## 2018-07-31 LAB — CALCIUM: Calcium:MCnc:Pt:Ser/Plas:Qn:: 9.4

## 2018-07-31 NOTE — Unmapped (Signed)
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  I reviewed the resident???s note.  I agree with the resident???s findings and plan including interpretation of images.     EXTENDED OPHTHALMOSCOPY RESULTS:  OD: MAs, PRP 360  OS: MAs, PRP 360

## 2018-08-04 MED FILL — PROGRAF 1 MG CAPSULE: 30 days supply | Qty: 180 | Fill #7

## 2018-08-04 MED FILL — MYFORTIC 180 MG TABLET,DELAYED RELEASE: 30 days supply | Qty: 180 | Fill #7 | Status: AC

## 2018-08-04 MED FILL — MYFORTIC 180 MG TABLET,DELAYED RELEASE: 30 days supply | Qty: 180 | Fill #7

## 2018-08-04 MED FILL — PROGRAF 1 MG CAPSULE: 30 days supply | Qty: 180 | Fill #7 | Status: AC

## 2018-08-11 DIAGNOSIS — Z940 Kidney transplant status: Principal | ICD-10-CM

## 2018-08-12 NOTE — Unmapped (Signed)
Return call to schedule annual. Travel screen covered.

## 2018-08-19 NOTE — Unmapped (Signed)
Pt called to check on upcoming annual appt with MD Detwiler on 3/31. He is currently feeling fine and has had recent labs that are WNL. MD Mottl has approved him to cancel his upcoming appt with her and the same for Dr Dellis Filbert. Message sent today via inbasket to MD Detwiler to make him aware pt called and that other MDs have cancelled as pt is currently healthy and we are limiting non-emergent appts for patients due to the coronavirus.

## 2018-08-20 ENCOUNTER — Institutional Professional Consult (permissible substitution): Admit: 2018-08-20 | Discharge: 2018-08-21 | Payer: MEDICARE

## 2018-08-20 DIAGNOSIS — T8610 Unspecified complication of kidney transplant: Principal | ICD-10-CM

## 2018-08-20 DIAGNOSIS — I2699 Other pulmonary embolism without acute cor pulmonale: Principal | ICD-10-CM

## 2018-08-20 DIAGNOSIS — E1169 Type 2 diabetes mellitus with other specified complication: Principal | ICD-10-CM

## 2018-08-20 LAB — CBC W/ AUTO DIFF
BASOPHILS ABSOLUTE COUNT: 0 10*9/L (ref 0.0–0.1)
BASOPHILS RELATIVE PERCENT: 0.3 %
EOSINOPHILS ABSOLUTE COUNT: 0.1 10*9/L (ref 0.0–0.4)
EOSINOPHILS RELATIVE PERCENT: 1 %
HEMATOCRIT: 47.6 % (ref 41.0–53.0)
HEMOGLOBIN: 15.5 g/dL (ref 13.5–17.5)
LYMPHOCYTES RELATIVE PERCENT: 11.5 %
MEAN CORPUSCULAR HEMOGLOBIN CONC: 32.5 g/dL (ref 31.0–37.0)
MEAN CORPUSCULAR HEMOGLOBIN: 28.1 pg (ref 26.0–34.0)
MEAN CORPUSCULAR VOLUME: 86.4 fL (ref 80.0–100.0)
MEAN PLATELET VOLUME: 9.3 fL (ref 7.0–10.0)
MONOCYTES ABSOLUTE COUNT: 0.4 10*9/L — ABNORMAL LOW (ref 0.2–0.8)
MONOCYTES RELATIVE PERCENT: 5.6 %
NEUTROPHILS ABSOLUTE COUNT: 5.8 10*9/L (ref 2.0–7.5)
NEUTROPHILS RELATIVE PERCENT: 80.3 %
PLATELET COUNT: 204 10*9/L (ref 150–440)
RED BLOOD CELL COUNT: 5.5 10*12/L (ref 4.50–5.90)
RED CELL DISTRIBUTION WIDTH: 14.2 % (ref 12.0–15.0)
WBC ADJUSTED: 7.2 10*9/L (ref 4.5–11.0)

## 2018-08-20 LAB — COMPREHENSIVE METABOLIC PANEL
ALBUMIN: 3.9 g/dL — ABNORMAL LOW (ref 3.5–5.0)
ALKALINE PHOSPHATASE: 91 U/L (ref 38–126)
ALT (SGPT): 21 U/L (ref <50)
ANION GAP: 18 mmol/L — ABNORMAL HIGH (ref 7–15)
AST (SGOT): 29 U/L (ref 19–55)
BILIRUBIN TOTAL: 0.8 mg/dL (ref 0.0–1.2)
BLOOD UREA NITROGEN: 49 mg/dL — ABNORMAL HIGH (ref 7–21)
BUN / CREAT RATIO: 31
CALCIUM: 9.8 mg/dL (ref 8.5–10.2)
CHLORIDE: 97 mmol/L — ABNORMAL LOW (ref 98–107)
CO2: 22 mmol/L (ref 22.0–30.0)
CREATININE: 1.56 mg/dL — ABNORMAL HIGH (ref 0.70–1.30)
EGFR CKD-EPI AA MALE: 51 mL/min/{1.73_m2} — ABNORMAL LOW (ref >=60)
EGFR CKD-EPI NON-AA MALE: 44 mL/min/{1.73_m2} — ABNORMAL LOW (ref >=60)
GLUCOSE RANDOM: 360 mg/dL — ABNORMAL HIGH (ref 70–179)
POTASSIUM: 5 mmol/L (ref 3.5–5.0)
PROTEIN TOTAL: 7 g/dL (ref 6.5–8.3)
SODIUM: 137 mmol/L (ref 135–145)

## 2018-08-20 LAB — LIPID PANEL
CHOLESTEROL/HDL RATIO SCREEN: 5.3 — ABNORMAL HIGH (ref <5.0)
CHOLESTEROL: 132 mg/dL (ref 100–199)
HDL CHOLESTEROL: 25 mg/dL — ABNORMAL LOW (ref 40–59)
LDL CHOLESTEROL CALCULATED: 46 mg/dL — ABNORMAL LOW (ref 60–99)
NON-HDL CHOLESTEROL: 107 mg/dL
VLDL CHOLESTEROL CAL: 60.8 mg/dL — ABNORMAL HIGH (ref 12–42)

## 2018-08-20 LAB — PROTIME-INR: INR: 1.67

## 2018-08-20 LAB — HEMOGLOBIN A1C: HEMOGLOBIN A1C: 8.3 % — ABNORMAL HIGH (ref 4.8–5.6)

## 2018-08-20 NOTE — Unmapped (Signed)
Labs drawn per Dr. Arvin Collard

## 2018-08-21 DIAGNOSIS — I4891 Unspecified atrial fibrillation: Principal | ICD-10-CM

## 2018-08-21 NOTE — Unmapped (Signed)
Increase to 5 mg daily.  Repeat INR in 10 days.    [name], M.D.     [facility] Internal Medicine at [facility]   [address]  [address]  [place], [place]  [address]  [phone]

## 2018-08-21 NOTE — Unmapped (Signed)
-----   Message from Amy Vinnie Langton, MD sent at 08/21/2018 10:51 AM EDT -----  Regarding: INR  J- you usually get to these labs before me, so I wanted to double check you saw Bill's INR is a bit low. Thanks!  Amy

## 2018-08-21 NOTE — Unmapped (Signed)
Patient states that he has not missed a dose, he takes 5 mg coumadin six days weekly, and 2.5 mg one day weekly.  He denies new medications, bruising, bleeding, extra doses.  Please advise.

## 2018-08-21 NOTE — Unmapped (Signed)
THanks Amy.  I dont' knowwhy I didn't get these results.  I ordered them.    [facility] Nursing, please let patient know is low at 1.6     Please let me know dosing. And check on compliance and dietary compliance over the last week.    [name], M.D.     [facility] Internal Medicine at [facility]   [address]  [address]  [place], [place]  [address]  [phone]

## 2018-08-21 NOTE — Unmapped (Signed)
Patient notified of dosage change    Voiced understanding.

## 2018-08-26 NOTE — Unmapped (Signed)
[facility] Refill Coordination Note    Specialty Medication(s) to be Shipped:   Transplant: Myfortic 180mg  and Prograf 1mg      David Ellis, DOB: March 19, 1946  Phone: [phone] (home)     All above HIPAA information was verified with patient.     Completed refill call assessment today to schedule patient's medication shipment from the [facility] [phone]).       Specialty medication(s) and dose(s) confirmed: Regimen is correct and unchanged.   Changes to medications: [name] reports no changes reported at this time.  Changes to insurance: No  Questions for the pharmacist: No    Confirmed patient received Welcome Packet with first shipment. The patient will receive a drug information handout for each medication shipped and additional FDA Medication Guides as required.       DISEASE/MEDICATION-SPECIFIC INFORMATION        N/A    SPECIALTY MEDICATION ADHERENCE     Medication Adherence    Patient reported X missed doses in the last month:  0  Specialty Medication:  Myfortic 180mg   Patient is on additional specialty medications:  Yes  Additional Specialty Medications:  Prograf 1mg   Patient Reported Additional Medication X Missed Doses in the Last Month:  0  Patient is on more than two specialty medications:  No  Any gaps in refill history greater than 2 weeks in the last 3 months:  no  Adherence tools used:  patient uses a pill box to manage medications  Support network for adherence:  family member        Myfortic 180 mg: 10 days of medicine on hand   Prograf 1 mg: 10 days of medicine on hand     SHIPPING     Shipping address confirmed in Epic.     Delivery Scheduled: Yes, Expected medication delivery date: 09/03/2018.     Medication will be delivered via UPS to the home address in Epic [place].    [name] P Allena Katz   [facility] Shared [facility] Specialty Technician

## 2018-08-29 NOTE — Unmapped (Signed)
Hi All,    Called and spoke to [name] about his appointment on 3.31.20 and the change from a face to face visit to a phone visit. [name] agrees to the change and wants to be reached at [phone].     He also has questions about labs and his radiology visits.       Regards,    Marcy Panning

## 2018-09-01 NOTE — Unmapped (Signed)
Patient seen on 3/31 not 3/30

## 2018-09-01 NOTE — Unmapped (Signed)
unos form

## 2018-09-02 ENCOUNTER — Ambulatory Visit: Admit: 2018-09-02 | Discharge: 2018-09-03 | Payer: MEDICARE

## 2018-09-02 ENCOUNTER — Telehealth: Admit: 2018-09-02 | Discharge: 2018-09-03 | Payer: MEDICARE | Attending: Nephrology | Primary: Nephrology

## 2018-09-02 DIAGNOSIS — I10 Essential (primary) hypertension: Principal | ICD-10-CM

## 2018-09-02 DIAGNOSIS — Z940 Kidney transplant status: Principal | ICD-10-CM

## 2018-09-02 DIAGNOSIS — D899 Disorder involving the immune mechanism, unspecified: Principal | ICD-10-CM

## 2018-09-02 DIAGNOSIS — Z48298 Encounter for aftercare following other organ transplant: Principal | ICD-10-CM

## 2018-09-02 DIAGNOSIS — I739 Peripheral vascular disease, unspecified: Principal | ICD-10-CM

## 2018-09-02 MED FILL — PROGRAF 1 MG CAPSULE: 30 days supply | Qty: 180 | Fill #8 | Status: AC

## 2018-09-02 MED FILL — MYFORTIC 180 MG TABLET,DELAYED RELEASE: 30 days supply | Qty: 180 | Fill #8

## 2018-09-02 MED FILL — MYFORTIC 180 MG TABLET,DELAYED RELEASE: 30 days supply | Qty: 180 | Fill #8 | Status: AC

## 2018-09-02 MED FILL — PROGRAF 1 MG CAPSULE: 30 days supply | Qty: 180 | Fill #8

## 2018-09-02 NOTE — Unmapped (Signed)
Transplant Nephrology Telephone Visit    Reason for Visit  A telephone visit was performed today due to the COVID-19 pandemic. He is seen for annual visit for follow-up of his kidney transplant and other medical issues. and immunosuppression management.  I spent 30 minutes with the patient.     History of Present Illness    Transplant History:  David Ellis is 73 y.o. 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  He has no history of rejection, urinary protein has been consistently normal, and serum creatinine has been stable in the range of approximately 1.3-1.8 mg/dL. 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    His posttransplant course was complicated by a pulmonary embolus in December 2014, foot infections necessitating amputation of 2 toes, and a stroke in February 2017.    Interval history:     He presents today stating he feels the best he has felt in the last 5 years. He does note dizziness and poor balance upon standing, especially in AM. When he sneezes he also sometimes notes dizziness. BP at home has been approximately 110/70s.  He is now taking Lasix daily and prn 2 times daily as well as chlorthalidone. His metoprolol XL dose was reduced to 150 mg from 200 mg due to relative hypotension. Edema is minimal and responsive to diuretics.      He denies chest pain, shortness of breath, dysuria, allograft tenderness, urinary retention, nausea, vomiting, or diarrhea.  He lost 13 lbs through dietary modification and treatment with Trulicity and exercise. Right hip pain has resolved. His foot wounds are healed and he denies new foot ulcers.      Review of Systems    All other systems are reviewed and are negative. A 10 systems review is completed.    Medications    Current Outpatient Medications   Medication Sig Dispense Refill   ??? aspirin (ECOTRIN) 81 MG tablet Take 81 mg by mouth continuous as needed. Only when flying and/or traveling by car more than 2 hours.     ??? BD INSULIN SYRINGE ULTRA-FINE 0.3 mL 31 gauge x 15/64 Syrg 1 each by Other route Three (3) times a day. 300 Syringe 3   ??? BD ULTRA-FINE NANO PEN NEEDLE 32 gauge x 5/32 Ndle USE UP TO 3 TIMES A DAY 300 each 3   ??? blood sugar diagnostic (FREESTYLE PRECISION NEO STRIPS) Strp Use to test blood sugar 4 times daily. Dx E11.65. 150 each 5   ??? blood-glucose meter kit Use as instructed. 1 each 0   ??? chlorthalidone (HYGROTON) 25 MG tablet TAKE ONE TABLET BY MOUTH EVERY DAY 60 tablet 5   ??? cholecalciferol, vitamin D3, (VITAMIN D3) 2,000 unit Tab Take 2,000 Units by mouth daily.      ??? dulaglutide (TRULICITY) 0.75 mg/0.5 mL injection pen Inject 0.5 mL (0.75 mg total) under the skin once a week. 4 Syringe 11   ??? furosemide (LASIX) 40 MG tablet TAKE 1 TO 2 TABLETS ONCE A DAY 180 each 3   ??? gabapentin (NEURONTIN) 300 MG capsule TAKE 1 CAPSULE TWICE DAILY 180 capsule 5   ??? HUMULIN R U-500, CONC, KWIKPEN 500 unit/mL (3 mL) CONCENTRATED injection DIAL TO MAX 100 UNITS BEFORE BREAKFAST, LUNCH, AND DINNER.  INJECT BEFOREMEALS MAX OF 300 UNITS DAILY 18 mL 11   ??? hydrocortisone 2.5 % cream   0   ???  irbesartan (AVAPRO) 150 MG tablet TAKE ONE TABLET AT BEDTIME 90 tablet 3   ??? ketoconazole (NIZORAL) 2 % cream   0   ??? lancets Misc 1 each by Miscellaneous route Four (4) times a day. 150 each 6   ??? magnesium chloride (SLOW_MAG) 64 mg TbEC Take 128 mg by mouth three (3) times a day (at 6am, noon and 6pm).      ??? metoprolol succinate (TOPROL-XL) 200 MG 24 hr tablet Take 200 mg in the morning and 100 mg in the evening 135 each 3   ??? multivitamin capsule Take 1 capsule by mouth daily.     ??? MYFORTIC 180 mg EC tablet TAKE 3 TABLETS (540 MG) BY MOUTH TWICE DAILY 180 tablet 35   ??? pravastatin (PRAVACHOL) 20 MG tablet TAKE ONE TABLET BY MOUTH EVERY DAY 90 each 3   ??? PROGRAF 1 mg capsule TAKE 3 CAPSULES (3 MG) BY MOUTH TWICE DAILY 180 capsule 11   ??? warfarin (COUMADIN) 2.5 MG tablet 2.5 mg. mondays  5   ??? warfarin (COUMADIN) 5 MG tablet Take 5 mg every day except mondays (Patient taking differently: 5 days a week) 30 tablet 6     No current facility-administered medications for this visit.      Facility-Administered Medications Ordered in Other Visits   Medication Dose Route Frequency Provider Last Rate Last Dose   ??? bevacizumab    PRN (once a day) Melvyn Novas, MD   2.5 mg at 01/28/17 0750       Physical Exam    No exam was done with today's phone visit.    Laboratory Results    No results found for this or any previous visit (from the past 170 hour(s)).    Assessment and Plan    David Ellis is a 73 y.o. male s/p LD kidney transplant 08/21/2011. Active medical issues include:    1.  Status post living unrelated donor kidney transplant with stable allograft function and prior biopsies revealing moderate to severe arteriosclerosis. The patient has a history of a DQ 7 donor specific antibody noted on 09/17/2011 with all subsequent HLA antibody screens revealing no DSA's. Serum creatinine 08/20/18 was 1.56 mg/dL and within his baseline range of 1.3-1.8 mg/dL. UP/C remains normal. He has had no urinary decoy cells.     2.  Immunosuppression management.  He is tolerating his current immunosuppressive regimen well. His tacrolimus target level is 6-10 and the level on 3/18 was 7.4.  Mycophenolate will be continued at a dosage of 540 mg twice daily and tacrolimus at 3 mg BID.    3. Hypertension, controlled. His metoprolol XL does was reduced to 150 mg daily from 200 mg daily yet he still notes some balance problems and dizziness with standing or sneezing. I have asked that he check BP twice daily for a week and send the record of BPs to his coordinator and cardiologist. We may need to further reduce antihypertensives.    4.  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 His wounds are healed with new ulcers.     5.  History of DVT and pulmonary embolus. Patient will continue Coumadin.      6.  Diabetes mellitus.  Hemoglobin A1c 8.3% on 08/19/2017.  He will follow with his endocrinologist and Dr. Arvin Collard.     7.  Atrial fibrillation.  He will continue Coumadin and metoprolol XL.    8. CVA, status post alteplase 07/2015. No residual deficits. On coumadin  and statin.    9.  Immunizations, up-to-date on pneumococcal vaccines, flu vaccine, and shingrix.      10.  Hyperparathyroidism and bone and mineral metabolism.  PTH most recently 192 with normal serum calcium.  Patient does not meet criteria for initiation of Sensipar.    11. Cancer screening. He sees Dermatology every 6 months. His last renal US in 08/2017 showed no renal masses. His last colonoscopy was 06/2011 and is again due. He was encouraged to have prostate exam by his PCP.     12.  Follow-up.  Patient will continue to follow with his multiple physicians. I will see him in 1 year. He needs screening renal US at the next face-to-face clinic visit.

## 2018-09-04 ENCOUNTER — Ambulatory Visit: Admit: 2018-09-04 | Discharge: 2018-09-05 | Payer: MEDICARE

## 2018-09-04 DIAGNOSIS — I2699 Other pulmonary embolism without acute cor pulmonale: Secondary | ICD-10-CM

## 2018-09-04 DIAGNOSIS — Z940 Kidney transplant status: Principal | ICD-10-CM

## 2018-09-04 LAB — CBC W/ AUTO DIFF
BASOPHILS ABSOLUTE COUNT: 0 10*9/L (ref 0.0–0.1)
EOSINOPHILS ABSOLUTE COUNT: 0.1 10*9/L (ref 0.0–0.4)
HEMATOCRIT: 45.5 % (ref 41.0–53.0)
HEMOGLOBIN: 15 g/dL (ref 13.5–17.5)
LARGE UNSTAINED CELLS: 2 % (ref 0–4)
LYMPHOCYTES ABSOLUTE COUNT: 0.7 10*9/L — ABNORMAL LOW (ref 1.5–5.0)
LYMPHOCYTES ABSOLUTE COUNT: 0.7 10*9/L — ABNORMAL LOW (ref 1.5–5.0)
LYMPHOCYTES RELATIVE PERCENT: 10.4 %
MEAN CORPUSCULAR HEMOGLOBIN CONC: 32.9 g/dL (ref 31.0–37.0)
MEAN CORPUSCULAR HEMOGLOBIN: 28.2 pg (ref 26.0–34.0)
MEAN CORPUSCULAR VOLUME: 85.8 fL (ref 80.0–100.0)
MEAN PLATELET VOLUME: 8.9 fL (ref 7.0–10.0)
MONOCYTES ABSOLUTE COUNT: 0.4 10*9/L (ref 0.2–0.8)
MONOCYTES RELATIVE PERCENT: 5.5 %
NEUTROPHILS ABSOLUTE COUNT: 5.4 10*9/L (ref 2.0–7.5)
NEUTROPHILS RELATIVE PERCENT: 79.7 %
PLATELET COUNT: 216 10*9/L (ref 150–440)
RED BLOOD CELL COUNT: 5.3 10*12/L (ref 4.50–5.90)
RED CELL DISTRIBUTION WIDTH: 14.3 % (ref 12.0–15.0)
WBC ADJUSTED: 6.8 10*9/L (ref 4.5–11.0)

## 2018-09-04 LAB — COMPREHENSIVE METABOLIC PANEL
ALBUMIN: 3.9 g/dL (ref 3.5–5.0)
ALKALINE PHOSPHATASE: 92 U/L (ref 38–126)
ALT (SGPT): 22 U/L (ref <50)
ANION GAP: 12 mmol/L (ref 7–15)
AST (SGOT): 29 U/L (ref 19–55)
BILIRUBIN TOTAL: 0.8 mg/dL (ref 0.0–1.2)
BLOOD UREA NITROGEN: 46 mg/dL — ABNORMAL HIGH (ref 7–21)
BUN / CREAT RATIO: 30
CALCIUM: 9.2 mg/dL (ref 8.5–10.2)
CHLORIDE: 95 mmol/L — ABNORMAL LOW (ref 98–107)
CO2: 29 mmol/L (ref 22.0–30.0)
CREATININE: 1.54 mg/dL — ABNORMAL HIGH (ref 0.70–1.30)
EGFR CKD-EPI AA MALE: 52 mL/min/{1.73_m2} — ABNORMAL LOW (ref >=60)
EGFR CKD-EPI NON-AA MALE: 45 mL/min/{1.73_m2} — ABNORMAL LOW (ref >=60)
GLUCOSE RANDOM: 391 mg/dL — ABNORMAL HIGH (ref 70–179)
POTASSIUM: 4.6 mmol/L (ref 3.5–5.0)

## 2018-09-04 NOTE — Unmapped (Signed)
-----   Message from Sharlee Blew, MD sent at 09/04/2018 12:20 PM EDT -----  Call and let patient know labs ok except INR didn't run (overcitrated?)   I really don't want him to come in more than necessary.  But his last INR was low.      Please return one to two weeks for INR.    [name], M.D.     [facility] Internal Medicine at [facility]   [address]  [address]  [place], [place]  [address]  [phone]

## 2018-09-04 NOTE — Unmapped (Signed)
Patient notified of results, voiced understanding and I have rescheduled coumadin clinic for him on Apirl 13.

## 2018-09-10 MED ORDER — GABAPENTIN 300 MG CAPSULE
0 refills | 0 days | Status: CP
Start: 2018-09-10 — End: 2018-10-10

## 2018-09-10 NOTE — Unmapped (Signed)
Dr. Wendie Agreste you like me to refill this? I wasn't sure if I should refill or send to his transplant coordinator.  Thanks.

## 2018-09-15 NOTE — Unmapped (Signed)
09/15/18    Travel Screening Questions Completed.    Travel Screening Questions/Answers:  1). Have you traveled within the last 14 days?: No  2). Do you have new or worsening respiratory symptoms (e.g. cough, difficulty breathing)?: No  3). Have you had close contact with a person with confirmed COVID-19 in the last 14 days before symptoms began?: No      Negative Travel Screen: Patient answered NO to questions 2 and 3. Proceed with current scheduling protocol for your clinic.       The call was handled in the following manner: Documented patient response and encounter closed

## 2018-09-16 ENCOUNTER — Ambulatory Visit: Admit: 2018-09-16 | Discharge: 2018-09-17 | Payer: MEDICARE

## 2018-09-16 DIAGNOSIS — I2699 Other pulmonary embolism without acute cor pulmonale: Principal | ICD-10-CM

## 2018-09-16 NOTE — Unmapped (Signed)
INr is high.  Decrease from 5 mg daily to   5 mg daily except 7th day take 2.5.    Need to avoid too high.    Repeat INR three weeks.    [name], M.D.     [facility] Internal Medicine at [facility]   [address]  [address]  [place], [place]  [address]  [phone]

## 2018-09-16 NOTE — Unmapped (Signed)
Dr. Dellis Filbert, the pt. Says he is taking 5 mg of coumadin daily. He also says that he was taking 5 mg six day and 2.5 mg on the 7th day prior to his earlier visit.  Please advise, Thanks

## 2018-09-18 NOTE — Unmapped (Signed)
09/18/18    Travel Screening Questions Completed.    Travel Screening Questions/Answers:  1). Have you traveled within the last 14 days?: No  2). Do you have new or worsening respiratory symptoms (e.g. cough, difficulty breathing)?: No  3). Have you had close contact with a person with confirmed COVID-19 in the last 14 days before symptoms began?: No      Negative Travel Screen: Patient answered NO to questions 2 and 3. Proceed with current scheduling protocol for your clinic.       The call was handled in the following manner: Documented patient response and encounter closed

## 2018-09-24 NOTE — Unmapped (Signed)
[facility] Refill Coordination Note    Specialty Medication(s) to be Shipped:   Transplant: Myfortic 180mg  and Prograf 1mg      David Ellis, DOB: [birth year]  Phone: [phone] (home)     All above HIPAA information was verified with patient.     Completed refill call assessment today to schedule patient's medication shipment from the [facility] [phone]).       Specialty medication(s) and dose(s) confirmed: Regimen is correct and unchanged.   Changes to medications: David Ellis reports no changes reported at this time.  Changes to insurance: No  Questions for the pharmacist: No    Confirmed patient received Welcome Packet with first shipment. The patient will receive a drug information handout for each medication shipped and additional FDA Medication Guides as required.       DISEASE/MEDICATION-SPECIFIC INFORMATION        N/A    SPECIALTY MEDICATION ADHERENCE     Medication Adherence    Patient reported X missed doses in the last month:  0  Specialty Medication:  Myfortic 180mg   Patient is on additional specialty medications:  Yes  Additional Specialty Medications:  Prograf 1mg    Patient Reported Additional Medication X Missed Doses in the Last Month:  0  Patient is on more than two specialty medications:  No  Any gaps in refill history greater than 2 weeks in the last 3 months:  no  Adherence tools used:  patient uses a pill box to manage medications  Support network for adherence:  family member         Myfortic 180 mg: 8 days of medicine on hand   Prograf 1 mg: 8 days of medicine on hand     SHIPPING     Shipping address confirmed in Epic.     Delivery Scheduled: Yes, Expected medication delivery date: 10/01/2018.     Medication will be delivered via UPS to the home address in Epic [place].    [name]   [facility] Shared [facility] Specialty Technician

## 2018-09-25 MED ORDER — IRBESARTAN 150 MG TABLET
ORAL_TABLET | 3 refills | 0 days | Status: CP
Start: 2018-09-25 — End: ?

## 2018-09-25 NOTE — Unmapped (Signed)
Last Cardiology Office Visit: 04/21/2018.    ARB Protocol Failed.

## 2018-09-30 MED FILL — MYFORTIC 180 MG TABLET,DELAYED RELEASE: 30 days supply | Qty: 180 | Fill #9 | Status: AC

## 2018-09-30 MED FILL — MYFORTIC 180 MG TABLET,DELAYED RELEASE: 30 days supply | Qty: 180 | Fill #9

## 2018-09-30 MED FILL — PROGRAF 1 MG CAPSULE: 30 days supply | Qty: 180 | Fill #9 | Status: AC

## 2018-09-30 MED FILL — PROGRAF 1 MG CAPSULE: 30 days supply | Qty: 180 | Fill #9

## 2018-10-07 ENCOUNTER — Ambulatory Visit: Admit: 2018-10-07 | Discharge: 2018-10-08 | Payer: MEDICARE

## 2018-10-07 DIAGNOSIS — Z940 Kidney transplant status: Principal | ICD-10-CM

## 2018-10-07 LAB — TACROLIMUS BLOOD: Lab: 6.8

## 2018-10-07 LAB — COMPREHENSIVE METABOLIC PANEL
ALBUMIN: 3.7 g/dL (ref 3.5–5.0)
ALKALINE PHOSPHATASE: 91 U/L (ref 38–126)
ALT (SGPT): 20 U/L (ref <50)
ANION GAP: 10 mmol/L (ref 7–15)
AST (SGOT): 27 U/L (ref 19–55)
BLOOD UREA NITROGEN: 48 mg/dL — ABNORMAL HIGH (ref 7–21)
BUN / CREAT RATIO: 28
CALCIUM: 9.4 mg/dL (ref 8.5–10.2)
CHLORIDE: 97 mmol/L — ABNORMAL LOW (ref 98–107)
CO2: 30 mmol/L (ref 22.0–30.0)
CREATININE: 1.73 mg/dL — ABNORMAL HIGH (ref 0.70–1.30)
EGFR CKD-EPI AA MALE: 45 mL/min/{1.73_m2} — ABNORMAL LOW (ref >=60)
GLUCOSE RANDOM: 363 mg/dL — ABNORMAL HIGH (ref 70–179)
POTASSIUM: 4.6 mmol/L (ref 3.5–5.0)
PROTEIN TOTAL: 6.8 g/dL (ref 6.5–8.3)
SODIUM: 137 mmol/L (ref 135–145)

## 2018-10-07 LAB — PHOSPHORUS: Phosphate:MCnc:Pt:Ser/Plas:Qn:: 3.2

## 2018-10-07 LAB — CBC W/ AUTO DIFF
BASOPHILS ABSOLUTE COUNT: 0 10*9/L (ref 0.0–0.1)
EOSINOPHILS ABSOLUTE COUNT: 0.1 10*9/L (ref 0.0–0.4)
EOSINOPHILS RELATIVE PERCENT: 1.2 %
HEMATOCRIT: 45.3 % (ref 41.0–53.0)
HEMOGLOBIN: 14.4 g/dL (ref 13.5–17.5)
LARGE UNSTAINED CELLS: 1 % (ref 0–4)
LYMPHOCYTES ABSOLUTE COUNT: 0.8 10*9/L — ABNORMAL LOW (ref 1.5–5.0)
LYMPHOCYTES RELATIVE PERCENT: 11.8 %
MEAN CORPUSCULAR HEMOGLOBIN CONC: 31.7 g/dL (ref 31.0–37.0)
MEAN CORPUSCULAR VOLUME: 86.9 fL (ref 80.0–100.0)
MEAN PLATELET VOLUME: 9.1 fL (ref 7.0–10.0)
MONOCYTES ABSOLUTE COUNT: 0.3 10*9/L (ref 0.2–0.8)
MONOCYTES RELATIVE PERCENT: 4.9 %
NEUTROPHILS ABSOLUTE COUNT: 5.5 10*9/L (ref 2.0–7.5)
NEUTROPHILS RELATIVE PERCENT: 80.9 %
PLATELET COUNT: 224 10*9/L (ref 150–440)
RED BLOOD CELL COUNT: 5.21 10*12/L (ref 4.50–5.90)
RED CELL DISTRIBUTION WIDTH: 14.4 % (ref 12.0–15.0)
WBC ADJUSTED: 6.8 10*9/L (ref 4.5–11.0)

## 2018-10-07 LAB — PLATELET COUNT: Lab: 224

## 2018-10-07 LAB — PROTIME: Lab: 33.5 — ABNORMAL HIGH

## 2018-10-07 LAB — PROTIME-INR: PROTIME: 33.5 s — ABNORMAL HIGH (ref 10.2–13.1)

## 2018-10-07 LAB — CREATININE: Creatinine:MCnc:Pt:Ser/Plas:Qn:: 1.73 — ABNORMAL HIGH

## 2018-10-08 NOTE — Unmapped (Signed)
-----   Message from Sharlee Blew, MD sent at 10/07/2018  4:04 PM EDT -----  Call and let patient know labs show  1) INR is ok.  Same dose repeat INR one month.    2) Creatinine bumped a bit, but stable from a few months ago.  REpeat labs one month.    Set up face to face appointment with me one month.  We can draw labs then.    Make it a CPE; we can do [facility] AWV THEN.  If it's still risky from COVID19, we can change it to video than.    [name], M.D.     [facility] Internal Medicine at [facility]   [address]  [address]  [place], [place]  [address]  [phone]

## 2018-10-09 NOTE — Unmapped (Signed)
Patient notified of results, voiced understanding.    AWV scheduled for November 04, 2018.

## 2018-10-10 MED ORDER — CHLORTHALIDONE 25 MG TABLET
0 refills | 0 days | Status: CP
Start: 2018-10-10 — End: 2019-01-14

## 2018-10-10 MED ORDER — GABAPENTIN 300 MG CAPSULE
0 refills | 0 days | Status: CP
Start: 2018-10-10 — End: 2019-01-16

## 2018-10-10 NOTE — Unmapped (Signed)
Hi Lauren,    It looks like you helped refill his last Chlorthalidone. Are you the appropriate person to also send his Gabapentin to?    Thank you,  Aundra Millet, RN

## 2018-10-20 MED ORDER — PROGRAF 1 MG CAPSULE
ORAL_CAPSULE | 5 refills | 0 days | Status: CP
Start: 2018-10-20 — End: 2019-02-03
  Filled 2018-10-22: qty 180, 30d supply, fill #0

## 2018-10-20 NOTE — Unmapped (Signed)
[facility] Clinical Assessment & Refill Coordination Note    David Ellis, DOB: Sep 16, 1945  Phone: [phone] (home)     All above HIPAA information was verified with patient.     Specialty Medication(s):   Transplant: Myfortic 180mg  and Prograf 1mg      Current Outpatient Medications   Medication Sig Dispense Refill   ??? aspirin (ECOTRIN) 81 MG tablet Take 81 mg by mouth continuous as needed. Only when flying and/or traveling by car more than 2 hours.     ??? BD INSULIN SYRINGE ULTRA-FINE 0.3 mL 31 gauge x 15/64 Syrg 1 each by Other route Three (3) times a day. 300 Syringe 3   ??? BD ULTRA-FINE NANO PEN NEEDLE 32 gauge x 5/32 Ndle USE UP TO 3 TIMES A DAY 300 each 3   ??? blood sugar diagnostic (FREESTYLE PRECISION NEO STRIPS) Strp Use to test blood sugar 4 times daily. Dx E11.65. 150 each 5   ??? blood-glucose meter kit Use as instructed. 1 each 0   ??? chlorthalidone (HYGROTON) 25 MG tablet TAKE ONE TABLET EVERY DAY 60 each 0   ??? cholecalciferol, vitamin D3, (VITAMIN D3) 2,000 unit Tab Take 2,000 Units by mouth daily.      ??? dulaglutide (TRULICITY) 0.75 mg/0.5 mL injection pen Inject 0.5 mL (0.75 mg total) under the skin once a week. 4 Syringe 11   ??? furosemide (LASIX) 40 MG tablet TAKE 1 TO 2 TABLETS ONCE A DAY 180 each 3   ??? gabapentin (NEURONTIN) 300 MG capsule TAKE 1 CAPSULE BY MOUTH TWICE DAILY 180 each 0   ??? HUMULIN R U-500, CONC, KWIKPEN 500 unit/mL (3 mL) CONCENTRATED injection DIAL TO MAX 100 UNITS BEFORE BREAKFAST, LUNCH, AND DINNER.  INJECT BEFOREMEALS MAX OF 300 UNITS DAILY 18 mL 11   ??? hydrocortisone 2.5 % cream   0   ??? irbesartan (AVAPRO) 150 MG tablet TAKE 1 TABLET AT BEDTIME 90 tablet 3   ??? ketoconazole (NIZORAL) 2 % cream   0   ??? lancets Misc 1 each by Miscellaneous route Four (4) times a day. 150 each 6   ??? magnesium chloride (SLOW_MAG) 64 mg TbEC Take 128 mg by mouth three (3) times a day (at 6am, noon and 6pm).      ??? metoprolol succinate (TOPROL-XL) 200 MG 24 hr tablet Take 200 mg in the morning and 100 mg in the evening 135 each 3   ??? multivitamin capsule Take 1 capsule by mouth daily.     ??? MYFORTIC 180 mg EC tablet TAKE 3 TABLETS (540 MG) BY MOUTH TWICE DAILY 180 tablet 35   ??? pravastatin (PRAVACHOL) 20 MG tablet TAKE ONE TABLET BY MOUTH EVERY DAY 90 each 3   ??? PROGRAF 1 mg capsule TAKE 3 CAPSULES (3 MG) BY MOUTH TWICE DAILY 180 capsule 11   ??? warfarin (COUMADIN) 2.5 MG tablet 2.5 mg. mondays  5   ??? warfarin (COUMADIN) 5 MG tablet Take 5 mg every day except mondays (Patient taking differently: 5 days a week) 30 tablet 6     No current facility-administered medications for this visit.      Facility-Administered Medications Ordered in Other Visits   Medication Dose Route Frequency Provider Last Rate Last Dose   ??? bevacizumab    PRN (once a day) Melvyn Novas, MD   2.5 mg at 01/28/17 0750        Changes to medications: Frans reports no changes at this time.  Allergies   Allergen Reactions   ??? Levofloxacin Other (See Comments)     Other reaction(s): Unknown  Unknown   ??? Lidocaine Other (See Comments)     I don't remember States is fine with Bupivicaine   ??? Penicillins      As child  Other reaction(s): UNKNOWN.   (UPDATE: Tolerated amp/sulbactam without side effects during 10/2014 hospitalization)   ??? Egg Derived Rash   ??? Enalapril Cough   ??? Epinephrine Palpitations   ??? Grass Pollen-[place], Standard Itching   ??? Lisinopril Rash   ??? Mepivacaine Hcl Palpitations       Changes to allergies: No    SPECIALTY MEDICATION ADHERENCE     Prograf 1 mg: 6 days of medicine on hand   Myfortic 180 mg: 6 days of medicine on hand     Medication Adherence    Patient reported X missed doses in the last month:  0  Specialty Medication:  Myfortic 180mg   Patient is on additional specialty medications:  Yes  Additional Specialty Medications:  Prograf 1mg   Patient Reported Additional Medication X Missed Doses in the Last Month:  0  Adherence tools used:  patient uses a pill box to manage medications  Support network for adherence:  family member          Specialty medication(s) dose(s) confirmed: Regimen is correct and unchanged.     Are there any concerns with adherence? No    Adherence counseling provided? Not needed    CLINICAL MANAGEMENT AND INTERVENTION      Clinical Benefit Assessment:    Do you feel the medicine is effective or helping your condition? Yes    Clinical Benefit counseling provided? Not needed    Adverse Effects Assessment:    Are you experiencing any side effects? No    Are you experiencing difficulty administering your medicine? No    Quality of Life Assessment:    How many days over the past month did your kidney transplant  keep you from your normal activities? For example, brushing your teeth or getting up in the morning. 0    Have you discussed this with your provider? Not needed    Therapy Appropriateness:    Is therapy appropriate? Yes, therapy is appropriate and should be continued    DISEASE/MEDICATION-SPECIFIC INFORMATION      N/A    PATIENT SPECIFIC NEEDS     ? Does the patient have any physical, cognitive, or cultural barriers? No    ? Is the patient high risk? Yes, patient taking a REMS drug     ? Does the patient require a Care Management Plan? No     ? Does the patient require physician intervention or other additional services (i.e. nutrition, smoking cessation, social work)? No      SHIPPING     Specialty Medication(s) to be Shipped:   Transplant: Myfortic 180mg  and Prograf 1mg     Other medication(s) to be shipped: NONE     Changes to insurance: No    Delivery Scheduled: Yes, Expected medication delivery date: 10/23/2018.     Medication will be delivered via UPS to the confirmed home address in [facility].    The patient will receive a drug information handout for each medication shipped and additional FDA Medication Guides as required.  Verified that patient has previously received a Conservation officer, historic buildings.    All of the patient's questions and concerns have been addressed.    [facility] Specialty Pharmacist

## 2018-10-22 MED FILL — PROGRAF 1 MG CAPSULE: 30 days supply | Qty: 180 | Fill #0 | Status: AC

## 2018-10-22 MED FILL — MYFORTIC 180 MG TABLET,DELAYED RELEASE: 30 days supply | Qty: 180 | Fill #10

## 2018-10-22 MED FILL — MYFORTIC 180 MG TABLET,DELAYED RELEASE: 30 days supply | Qty: 180 | Fill #10 | Status: AC

## 2018-11-04 ENCOUNTER — Ambulatory Visit: Admit: 2018-11-04 | Discharge: 2018-11-05 | Payer: MEDICARE

## 2018-11-04 DIAGNOSIS — Z940 Kidney transplant status: Principal | ICD-10-CM

## 2018-11-04 LAB — CBC W/ AUTO DIFF
BASOPHILS ABSOLUTE COUNT: 0 10*9/L (ref 0.0–0.1)
BASOPHILS RELATIVE PERCENT: 0.3 %
EOSINOPHILS ABSOLUTE COUNT: 0.1 10*9/L (ref 0.0–0.4)
EOSINOPHILS RELATIVE PERCENT: 1 %
HEMATOCRIT: 44.6 % (ref 41.0–53.0)
HEMOGLOBIN: 15.1 g/dL (ref 13.5–17.5)
LARGE UNSTAINED CELLS: 1 % (ref 0–4)
LYMPHOCYTES RELATIVE PERCENT: 12.2 %
MEAN CORPUSCULAR HEMOGLOBIN CONC: 33.9 g/dL (ref 31.0–37.0)
MEAN CORPUSCULAR HEMOGLOBIN: 29.3 pg (ref 26.0–34.0)
MEAN CORPUSCULAR VOLUME: 86.5 fL (ref 80.0–100.0)
MONOCYTES ABSOLUTE COUNT: 0.4 10*9/L (ref 0.2–0.8)
MONOCYTES RELATIVE PERCENT: 5.5 %
NEUTROPHILS ABSOLUTE COUNT: 5.5 10*9/L (ref 2.0–7.5)
NEUTROPHILS RELATIVE PERCENT: 80 %
PLATELET COUNT: 217 10*9/L (ref 150–440)
RED CELL DISTRIBUTION WIDTH: 14.5 % (ref 12.0–15.0)
WBC ADJUSTED: 6.9 10*9/L (ref 4.5–11.0)

## 2018-11-04 LAB — COMPREHENSIVE METABOLIC PANEL
ALBUMIN: 3.8 g/dL (ref 3.5–5.0)
ALKALINE PHOSPHATASE: 82 U/L (ref 38–126)
ALT (SGPT): 18 U/L (ref <50)
ANION GAP: 14 mmol/L (ref 7–15)
AST (SGOT): 28 U/L (ref 19–55)
BILIRUBIN TOTAL: 0.8 mg/dL (ref 0.0–1.2)
BLOOD UREA NITROGEN: 49 mg/dL — ABNORMAL HIGH (ref 7–21)
CALCIUM: 9.4 mg/dL (ref 8.5–10.2)
CHLORIDE: 97 mmol/L — ABNORMAL LOW (ref 98–107)
CO2: 29 mmol/L (ref 22.0–30.0)
CREATININE: 1.65 mg/dL — ABNORMAL HIGH (ref 0.70–1.30)
EGFR CKD-EPI AA MALE: 48 mL/min/{1.73_m2} — ABNORMAL LOW (ref >=60)
EGFR CKD-EPI NON-AA MALE: 41 mL/min/{1.73_m2} — ABNORMAL LOW (ref >=60)
GLUCOSE RANDOM: 357 mg/dL — ABNORMAL HIGH (ref 70–179)
POTASSIUM: 4.8 mmol/L (ref 3.5–5.0)
PROTEIN TOTAL: 6.7 g/dL (ref 6.5–8.3)
SODIUM: 140 mmol/L (ref 135–145)

## 2018-11-04 LAB — PROTIME: Lab: 41.9 — ABNORMAL HIGH

## 2018-11-04 LAB — TACROLIMUS BLOOD: Lab: 7.8

## 2018-11-04 LAB — SODIUM: Sodium:SCnc:Pt:Ser/Plas:Qn:: 140

## 2018-11-04 LAB — PHOSPHORUS: Phosphate:MCnc:Pt:Ser/Plas:Qn:: 3.4

## 2018-11-04 LAB — LARGE UNSTAINED CELLS: Lab: 1

## 2018-11-04 LAB — PROTIME-INR: INR: 3.55

## 2018-11-04 NOTE — Unmapped (Signed)
Patient takes 5 mg coumadin daily.

## 2018-11-04 NOTE — Unmapped (Signed)
-----   Message from Sharlee Blew, MD sent at 11/04/2018  2:13 PM EDT -----  HOLD FOR OV  INR is high at 3.55.  Please get current coumadin dosing.    [name], M.D.     [facility] Internal Medicine at [facility]   [address]  [address]  [place], [place]  [address]  [phone]

## 2018-11-05 NOTE — Unmapped (Signed)
Decrease from 5 mg daily to 5 mg daily except Mon, Thu where 2.5 mg daily.    REpeat INR in three weeks.    Does he want POCT or venipuncture?    [name], M.D.     [facility] Internal Medicine at [facility]   [address]  [address]  [place], [place]  [address]  [phone]

## 2018-11-05 NOTE — Unmapped (Signed)
Patient notified and voiced understanding of new dosing instructions.  He has a scheduled lab visit on July 2 and will included the INR with that.

## 2018-11-07 ENCOUNTER — Telehealth: Admit: 2018-11-07 | Discharge: 2018-11-08 | Payer: MEDICARE

## 2018-11-07 DIAGNOSIS — Z940 Kidney transplant status: Secondary | ICD-10-CM

## 2018-11-07 DIAGNOSIS — Z0000 Encounter for general adult medical examination without abnormal findings: Secondary | ICD-10-CM

## 2018-11-07 DIAGNOSIS — E782 Mixed hyperlipidemia: Secondary | ICD-10-CM

## 2018-11-07 DIAGNOSIS — I2699 Other pulmonary embolism without acute cor pulmonale: Secondary | ICD-10-CM

## 2018-11-07 DIAGNOSIS — I10 Essential (primary) hypertension: Secondary | ICD-10-CM

## 2018-11-07 DIAGNOSIS — E1165 Type 2 diabetes mellitus with hyperglycemia: Secondary | ICD-10-CM

## 2018-11-07 DIAGNOSIS — I4891 Unspecified atrial fibrillation: Secondary | ICD-10-CM

## 2018-11-07 DIAGNOSIS — E1129 Type 2 diabetes mellitus with other diabetic kidney complication: Principal | ICD-10-CM

## 2018-11-07 MED ORDER — METOPROLOL SUCCINATE ER 200 MG TABLET,EXTENDED RELEASE 24 HR
3 refills | 0 days | Status: CP
Start: 2018-11-07 — End: 2018-12-23

## 2018-11-07 NOTE — Unmapped (Signed)
Problem List Items Addressed This Visit        High    Atrial fibrillation (CMS-HCC)    Relevant Medications    metoprolol succinate (TOPROL-XL) 200 MG 24 hr tablet    Hypertension    Relevant Medications    metoprolol succinate (TOPROL-XL) 200 MG 24 hr tablet    Mixed hyperlipidemia    Pulmonary embolism (CMS-HCC)    Transplanted kidney    Type II diabetes mellitus with renal manifestations, uncontrolled (CMS-HCC) - Primary

## 2018-11-07 NOTE — Unmapped (Signed)
INR sl high.  Decreassed dose.    REpeat INR one month.

## 2018-11-07 NOTE — Unmapped (Signed)
REports outstanding control.  Check labs today.

## 2018-11-07 NOTE — Unmapped (Signed)
A1C up a bit; seeing Dr. Tiburcio Pea in a week.  Will get A1C done then.

## 2018-11-07 NOTE — Unmapped (Addendum)
MC AWV ok.  CHeck psa next time.    RE: colonoscopy, he feels too high risk and declines colonoscopy at this point.    FIT TEsting.

## 2018-11-07 NOTE — Unmapped (Signed)
TeleHealth Video Encounter  This medical encounter was conducted virtually using [email or web address]  [email or web address] protocols.    Patient ID: David Ellis is a 73 y.o. male who presents by video interaction for evaluation of medical issues.      Present on Video Call: Patient is alone.      Assessment/Plan:      No problem-specific Assessment & Plan notes found for this encounter.      Preventive services addressed today  Preventive services are currently up to date    -- Discussed the new prescription noted above, including potential side effects, drug interactions, instructions for taking the medication, and the consequences of not taking it.  -- Patient verbalized an understanding of today's assessment and recommendations, as well as the purpose of ongoing medications.    Return in about 6 months (around 05/09/2019), or set up labs in one month.  .    Medication adherence and barriers to the treatment plan have been addressed. Opportunities to optimize healthy behaviors have been discussed. Patient / caregiver voiced understanding.        Subjective:     HPI  I spoke with patient today on video.    He reports that generally he is doing well.  Watching a lot of TV.  Trying to play guitar.  Reports that his mood is doing well.    Decreased toprol to 150 mg daily.  Reports in 120s/80s.      Re: the coumadin, no bleeding problems at all.      Re: the diabetes, last A1C in March was a bit high; no hpylyglycemia.  Still seeing Dr. Tiburcio Pea.    Reports that his feet are doing reasonably well.  Staying quiet and no new sores or ulcerations.  He does look at his feet every day.      The patient is without complaints of chest pain, sob, fever, chills, nausea, vomiting, diarrhea, constipation, BRBPR, melena, abdominal pains, weight changes, confusion, headaches.          ROS  As per HPI.    Outpatient Medications Prior to Visit   Medication Sig Dispense Refill   ??? aspirin (ECOTRIN) 81 MG tablet Take 81 mg by mouth continuous as needed. Only when flying and/or traveling by car more than 2 hours.     ??? BD INSULIN SYRINGE ULTRA-FINE 0.3 mL 31 gauge x 15/64 Syrg 1 each by Other route Three (3) times a day. 300 Syringe 3   ??? BD ULTRA-FINE NANO PEN NEEDLE 32 gauge x 5/32 Ndle USE UP TO 3 TIMES A DAY 300 each 3   ??? blood sugar diagnostic (FREESTYLE PRECISION NEO STRIPS) Strp Use to test blood sugar 4 times daily. Dx E11.65. 150 each 5   ??? chlorthalidone (HYGROTON) 25 MG tablet TAKE ONE TABLET EVERY DAY 60 each 0   ??? cholecalciferol, vitamin D3, (VITAMIN D3) 2,000 unit Tab Take 2,000 Units by mouth daily.      ??? dulaglutide (TRULICITY) 0.75 mg/0.5 mL injection pen Inject 0.5 mL (0.75 mg total) under the skin once a week. 4 Syringe 11   ??? furosemide (LASIX) 40 MG tablet TAKE 1 TO 2 TABLETS ONCE A DAY 180 each 3   ??? gabapentin (NEURONTIN) 300 MG capsule TAKE 1 CAPSULE BY MOUTH TWICE DAILY 180 each 0   ??? HUMULIN R U-500, CONC, KWIKPEN 500 unit/mL (3 mL) CONCENTRATED injection DIAL TO MAX 100 UNITS BEFORE BREAKFAST, LUNCH, AND DINNER.  INJECT BEFOREMEALS MAX OF 300 UNITS DAILY 18  mL 11   ??? hydrocortisone 2.5 % cream   0   ??? irbesartan (AVAPRO) 150 MG tablet TAKE 1 TABLET AT BEDTIME 90 tablet 3   ??? ketoconazole (NIZORAL) 2 % cream   0   ??? lancets Misc 1 each by Miscellaneous route Four (4) times a day. 150 each 6   ??? magnesium chloride (SLOW_MAG) 64 mg TbEC Take 128 mg by mouth three (3) times a day (at 6am, noon and 6pm).      ??? multivitamin capsule Take 1 capsule by mouth daily.     ??? MYFORTIC 180 mg EC tablet TAKE 3 TABLETS (540 MG) BY MOUTH TWICE DAILY 180 tablet 35   ??? pravastatin (PRAVACHOL) 20 MG tablet TAKE ONE TABLET BY MOUTH EVERY DAY 90 each 3   ??? PROGRAF 1 mg capsule Take 3 capsules (3mg ) by mouth twice daily. 540 capsule 5   ??? warfarin (COUMADIN) 2.5 MG tablet 2.5 mg. Mondays and Thursdays  5   ??? warfarin (COUMADIN) 5 MG tablet Take 5 mg every day except mondays (Patient taking differently: 5 days a week) 30 tablet 6   ??? metoprolol succinate (TOPROL-XL) 200 MG 24 hr tablet Take 200 mg in the morning and 100 mg in the evening 135 each 3   ??? blood-glucose meter kit Use as instructed. 1 each 0     Facility-Administered Medications Prior to Visit   Medication Dose Route Frequency Provider Last Rate Last Dose   ??? bevacizumab    PRN (once a day) Melvyn Novas, MD   2.5 mg at 01/28/17 1610       The following portions of the patient's history were reviewed and updated as appropriate: allergies, current medications, past medical history, past social history and problem list.          Objective:     As part of this Video Visit, no in-person exam was conducted.  Video interaction permitted the following observations.    General: Well appearing male in nad.    RESP: Relaxed respiratory effort.   SKIN: No rashes noted.  NEURO: Normal coordination.  No tremors observed.  PSYCH: Alert and oriented.  Speech fluent and sensible.  Calm affect.       I spent 20 minutes on the audio/video with the patient. I spent an additional 4 minutes on pre- and post-visit activities.     The patient was physically located in [place] or a state in which I am permitted to provide care. The patient and/or parent/gauardian understood that s/he may incur co-pays and cost sharing, and agreed to the telemedicine visit. The visit was completed via phone and/or video, which was appropriate and reasonable under the circumstances given the patient's presentation at the time.    The patient and/or parent/guardian has been advised of the potential risks and limitations of this mode of treatment (including, but not limited to, the absence of in-person examination) and has agreed to be treated using telemedicine. The patient's/patient's family's questions regarding telemedicine have been answered.     If the phone/video visit was completed in an ambulatory setting, the patient and/or parent/guardian has also been advised to contact their provider???s office for worsening conditions, and seek emergency medical treatment and/or call 911 if the patient deems either necessary.      Patient ID: David Ellis is a 73 y.o. male who presents for a Medicare Annual Wellness Visit, with additional evaluation and management of hypertension, diabetes, chol.   (see separate note  in this encounter).    Informant: Patient is accompanied by spouse..    Assessment/Plan:      Medicare Annual Wellness Visit      Risks identified  There were no significant safety risks identified at today's visit.    Treatment options and their associated risks/benefits were reviewed with the patient.    Advance care planning  -- Patient declines any living will documentation at this time.  Health care POA is     Personalized Prevention Plan  A personalized prevention plan was reviewed with the patient and a written copy was provided for personal records (available for review in Patient Instructions).    The following preventive services were advised:  Diabetes screening: ordered  Lipid screening: ordered  PSA: risks and benefits of prostate cancer screening were carefully reviewed -- ordered    Return in about 6 months (around 05/09/2019), or set up labs in one month.  .       Subjective:     Health Risk Assessment (HRA)   HRA completed by patient and reviewed: No    Medical/Family History  Allergies   Allergen Reactions   ??? Levofloxacin Other (See Comments)     Other reaction(s): Unknown  Unknown   ??? Lidocaine Other (See Comments)     I don't remember States is fine with Bupivicaine   ??? Penicillins      As child  Other reaction(s): UNKNOWN.   (UPDATE: Tolerated amp/sulbactam without side effects during 10/2014 hospitalization)   ??? Egg Derived Rash   ??? Enalapril Cough   ??? Epinephrine Palpitations   ??? Grass Pollen-[place], Standard Itching   ??? Lisinopril Rash   ??? Mepivacaine Hcl Palpitations       Outpatient Medications Prior to Visit   Medication Sig Dispense Refill   ??? aspirin (ECOTRIN) 81 MG tablet Take 81 mg by mouth continuous as needed. Only when flying and/or traveling by car more than 2 hours.     ??? BD INSULIN SYRINGE ULTRA-FINE 0.3 mL 31 gauge x 15/64 Syrg 1 each by Other route Three (3) times a day. 300 Syringe 3   ??? BD ULTRA-FINE NANO PEN NEEDLE 32 gauge x 5/32 Ndle USE UP TO 3 TIMES A DAY 300 each 3   ??? blood sugar diagnostic (FREESTYLE PRECISION NEO STRIPS) Strp Use to test blood sugar 4 times daily. Dx E11.65. 150 each 5   ??? chlorthalidone (HYGROTON) 25 MG tablet TAKE ONE TABLET EVERY DAY 60 each 0   ??? cholecalciferol, vitamin D3, (VITAMIN D3) 2,000 unit Tab Take 2,000 Units by mouth daily.      ??? dulaglutide (TRULICITY) 0.75 mg/0.5 mL injection pen Inject 0.5 mL (0.75 mg total) under the skin once a week. 4 Syringe 11   ??? furosemide (LASIX) 40 MG tablet TAKE 1 TO 2 TABLETS ONCE A DAY 180 each 3   ??? gabapentin (NEURONTIN) 300 MG capsule TAKE 1 CAPSULE BY MOUTH TWICE DAILY 180 each 0   ??? HUMULIN R U-500, CONC, KWIKPEN 500 unit/mL (3 mL) CONCENTRATED injection DIAL TO MAX 100 UNITS BEFORE BREAKFAST, LUNCH, AND DINNER.  INJECT BEFOREMEALS MAX OF 300 UNITS DAILY 18 mL 11   ??? hydrocortisone 2.5 % cream   0   ??? irbesartan (AVAPRO) 150 MG tablet TAKE 1 TABLET AT BEDTIME 90 tablet 3   ??? ketoconazole (NIZORAL) 2 % cream   0   ??? lancets Misc 1 each by Miscellaneous route Four (4) times a day. 150 each 6   ???  magnesium chloride (SLOW_MAG) 64 mg TbEC Take 128 mg by mouth three (3) times a day (at 6am, noon and 6pm).      ??? multivitamin capsule Take 1 capsule by mouth daily.     ??? MYFORTIC 180 mg EC tablet TAKE 3 TABLETS (540 MG) BY MOUTH TWICE DAILY 180 tablet 35   ??? pravastatin (PRAVACHOL) 20 MG tablet TAKE ONE TABLET BY MOUTH EVERY DAY 90 each 3   ??? PROGRAF 1 mg capsule Take 3 capsules (3mg ) by mouth twice daily. 540 capsule 5   ??? warfarin (COUMADIN) 2.5 MG tablet 2.5 mg. Mondays and Thursdays  5   ??? warfarin (COUMADIN) 5 MG tablet Take 5 mg every day except mondays (Patient taking differently: 5 days a week) 30 tablet 6   ??? metoprolol succinate (TOPROL-XL) 200 MG 24 hr tablet Take 200 mg in the morning and 100 mg in the evening 135 each 3   ??? blood-glucose meter kit Use as instructed. 1 each 0     Facility-Administered Medications Prior to Visit   Medication Dose Route Frequency Provider Last Rate Last Dose   ??? bevacizumab    PRN (once a day) Melvyn Novas, MD   2.5 mg at 01/28/17 0750       Past Medical History:   Diagnosis Date   ??? Atrial flutter (CMS-HCC)    ??? Diabetes mellitus (CMS-HCC)    ??? Diabetic nephropathy (CMS-HCC)    ??? Diabetic retinopathy (CMS-HCC)    ??? Fractures    ??? Ganglion cyst    ??? Hand injury    ??? Heart disease    ??? Hyperlipidemia    ??? Hypertension    ??? Joint pain    ??? Osteomyelitis (CMS-HCC) June 2016   ??? Pulmonary embolism (CMS-HCC) Dec 2015   ??? 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 Stroke (CMS-HCC)    ??? Tear of meniscus of knee    ??? 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 NEPHRECTOMY TRANSPLANTED ORGAN Right     LURD - spouse received in 2013.   ??? PR AMPUTATION METATARSAL+TOE,SINGLE Left 07/06/2014    Procedure: AMPUTATION, METATARSAL, WITH TOE SINGLE;  Surgeon: Marion Downer, MD;  Location: MAIN [facility];  Service: Vascular   ??? PR AMPUTATION METATARSAL+TOE,SINGLE Left 10/28/2014    Procedure: AMPUTATION, METATARSAL, WITH TOE SINGLE;  Surgeon: Maple Mirza, MD;  Location: MAIN [facility];  Service: Vascular   ??? PR VITRECTOMY,PANRETINAL LASER RX Right 11/26/2016    Procedure: VITRECTOMY, MECHANICAL, PARS PLANA APPROACH; WITH ENDOLASER PANRETINAL PHOTOCOAGULATION;  Surgeon: Melvyn Novas, MD;  Location: [facility];  Service: Ophthalmology   ??? PR VITRECTOMY,PANRETINAL LASER RX Left 01/28/2017    Procedure: VITRECTOMY, MECHANICAL, PARS PLANA APPROACH; WITH ENDOLASER PANRETINAL PHOTOCOAGULATION;  Surgeon: Melvyn Novas, MD;  [facility];  Service: Ophthalmology   ??? SKIN BIOPSY         Family History   Problem Relation Age of Onset   ??? Kidney disease Mother    ??? Diabetes Mother    ??? Kidney disease Father    ??? Kidney disease Maternal Grandmother    ??? Diabetes Maternal Grandmother    ??? Glaucoma Neg Hx    ??? Macular degeneration Neg Hx        Functional Abilities/ Level of Safety  Home  safety concerns: No  ADL needs: none  iADL needs: none  Fall in the last year: No  Hearing difficulty: No        Psychosocial risks  Past history of depression: No  Today    Alcohol risk: No  Social History     Tobacco Use   ??? Smoking status: Never Smoker   ??? Smokeless tobacco: Never Used   Substance Use Topics   ??? Alcohol use: No   ??? Drug use: No       Preventive Care  Health Maintenance   Topic Date Due   ??? Foot Exam  07/02/2018   ??? Hemoglobin A1c  11/20/2018   ??? Retinal Eye Exam  08/01/2019   ??? Serum Creatinine Monitoring  11/04/2019   ??? Potassium Monitoring  11/04/2019   ??? Colonoscopy  06/04/2021   ??? DTaP/Tdap/Td Vaccines (2 - Td) 07/03/2024   ??? Hepatitis C Screen  Completed   ??? Pneumococcal Vaccines  Completed   ??? Influenza Vaccine  Completed   ??? Zoster Vaccines  Completed        Immunization History   Administered Date(s) Administered   ??? INFLUENZA TIV (TRI) PF (IM) 03/05/2012   ??? Influenza Vaccine Quad (IIV4 PF) 68mo+ injectable 03/01/2017   ??? Influenza Virus Vaccine, unspecified formulation 03/04/2013, 03/18/2014, 03/22/2015, 04/11/2016, 02/21/2018   ??? Influenza, High Dose (IIV3) 65 yrs & older 04/11/2016   ??? PNEUMOCOCCAL POLYSACCHARIDE 23 06/29/2011, 07/13/2016   ??? Pneumococcal Conjugate 13-Valent 08/26/2013   ??? SHINGRIX-ZOSTER VACCINE (HZV), RECOMBINANT,SUB-UNIT,ADJUVANTED IM 08/27/2017, 12/12/2017   ??? TdaP 07/03/2014       Current Providers   Patient Care Team:  Sharlee Blew, MD as PCP - General (Internal Medicine)  Amy Vinnie Langton, MD as PCP - Transplant Physician (Nephrology)  Sharlee Blew, MD as PCP - General-ATTRIBUTED  Randal Ewing Schlein, MD as Transplant Nephrologist (Nephrology)  Donzetta Kohut, MD (Endocrinology)  Lonell Face, CPP as Pharmacist (Endocrinology)  Yehuda Budd, MD as Consulting Physician (Cardiology)  Devona Konig, RN as Transplant Coordinator (Transplant Nephrology)    Advance Directive and Code Status   Patient has not yet created a living will.          Objective:     Vital Signs  There were no vitals taken for this visit.     Hearing Test   not badk enough to get hearing aids    Mobility Test   Patient is able to rise from chair without assistance and ambulate without aid: Yes             Cognitive Assessment  It is apparent from the interview that the patient is cognitively intact with high functioning intellectual faculties.      Other Exam  Deferred

## 2018-11-12 NOTE — Unmapped (Signed)
[facility] Refill Coordination Note    Specialty Medication(s) to be Shipped:   Transplant: Myfortic 180mg  and Prograf 1mg     Other medication(s) to be shipped: none     David Ellis, DOB: 10-03-1945  Phone: [phone] (home)       All above HIPAA information was verified with patient.     Completed refill call assessment today to schedule patient's medication shipment from the [facility] [phone]).       Specialty medication(s) and dose(s) confirmed: Regimen is correct and unchanged.   Changes to medications: [name] reports no changes at this time.  Changes to insurance: No  Questions for the pharmacist: No    Confirmed patient received Welcome Packet with first shipment. The patient will receive a drug information handout for each medication shipped and additional FDA Medication Guides as required.       DISEASE/MEDICATION-SPECIFIC INFORMATION        N/A    SPECIALTY MEDICATION ADHERENCE     Medication Adherence    Patient reported X missed doses in the last month:  0  Adherence tools used:  patient uses a pill box to manage medications  Support network for adherence:  family member           Myfortic 180 mg: 10 days of medicine on hand   Prograf 1 mg: 10 days of medicine on hand           SHIPPING     Shipping address confirmed in Epic.     Delivery Scheduled: Yes, Expected medication delivery date: 11/21/18.     Medication will be delivered via UPS to the home address in Epic WAM.    David Ellis   [facility] Specialty Technician

## 2018-11-17 MED ORDER — BD ULTRA-FINE NANO PEN NEEDLE 32 GAUGE X 5/32" (4 MM)
0 refills | 0 days | Status: CP
Start: 2018-11-17 — End: 2018-11-20

## 2018-11-18 ENCOUNTER — Ambulatory Visit: Admit: 2018-11-18 | Discharge: 2018-11-19 | Payer: MEDICARE | Attending: "Endocrinology | Primary: "Endocrinology

## 2018-11-18 DIAGNOSIS — E1165 Type 2 diabetes mellitus with hyperglycemia: Secondary | ICD-10-CM

## 2018-11-18 DIAGNOSIS — E1142 Type 2 diabetes mellitus with diabetic polyneuropathy: Secondary | ICD-10-CM

## 2018-11-18 DIAGNOSIS — E1129 Type 2 diabetes mellitus with other diabetic kidney complication: Principal | ICD-10-CM

## 2018-11-18 DIAGNOSIS — I739 Peripheral vascular disease, unspecified: Secondary | ICD-10-CM

## 2018-11-18 NOTE — Unmapped (Signed)
Freestyle [place] uploaded to [employer]. PP 0815. POCT glucose and A1C performed.290 mg/dL.

## 2018-11-18 NOTE — Unmapped (Signed)
No change in insulin doses today.   Try rotating injections to back of arm, or thigh, or laterally, or upper buttock.   If no improvement in glucoses, then we can increase doses.     Document for [employer] for CGM and DM shoes.     Follow up 2 weeks by phone or video to review Freestyle Libre.

## 2018-11-18 NOTE — Unmapped (Signed)
ASSESSMENT / PLAN  1. Type II diabetes mellitus with renal manifestations, uncontrolled (CMS-HCC)  Hyperglycemia, but no change in insulin doses today as I am concerned that the problem may be a lack of absorption at the two thickened/scarred/hypertrophic insulin injection sites he is currently using.   Try rotating injections to back of arm, or thigh, or laterally, or upper buttock.   If no improvement in glucoses, then we can increase doses.   Continue Trulicity and u500 insulin pen and Freestyle Libre.    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 This results in the formation of dirt-like plaques despite normal hygiene. It is relatively common and most often occurs in children. Lesions typically involve the neck, ankle (posterior to the medial or lateral malleolus), and face but may also occur on other body areas including the abdomen. Diagnosis and treatment can be achieved by removing lesions with gentle isopropyl alcohol swabbing, with once a week application as prophylaxis.  - I have asked patient to share the above with his Dermatologist (I believe you said is outside of the [facility] system) to see if they agree on potential diagnosis and recommendation for treatment.     2. PAD (peripheral artery disease) (CMS-HCC)  3. Diabetic polyneuropathy associated with type 2 diabetes mellitus (CMS-HCC)  Meets Medicare criteria for DM shoes with exam below:  1. I am treating this patient under comprehensive plan of care for diabetes and have discussed proper foot care with the patient.   2. Patient has:  -history of partial or complete amputation of the foot  -foot deformity  -poor circulation  3. I agree the patient will benefit from diabetes shoes and custom inserts as prescribed.    MEDICARE:   *The patient requires a therapeutic CGM.   *The patient has diabetes mellitus and is insulin-treated with 3 or more daily injections of insulin(MDII).   *The patient has been using CGM and is checking glucose 4 or more times per day x 30 days.  *The patient???s insulin treatment regimen requires frequent adjustments by the patient on the basis of therapeutic CGM testing results.   *Within the last six months prior to ordering the CGM, I had an in-person visit with the beneficiary to evaluate their diabetes control and determine that the above criteria are met.   *Every six months following the initial prescription of the CGM, I will have an in-person visit with the beneficiary to assess adherence to their CGM regimen and diabetes treatment plan.      Greater than 50% of 25 min visit was spent face to face with patient on counseling and coordination of care regarding management of insulin dependent diabetes with multiple complications as detailed in note.    Return in about 2 weeks (around 12/02/2018).  RTC 2 week via video or phone to review freestyle libre which he can learn to download at home.     SUBJECTIVE  PCP: David Ellis  Tplant: David Ellis  Optho:  David Ellis  Wound: David Ellis  Podiatry: David Ellis, David Ellis     HPI: David Ellis is a 73 y.o. male who I last saw 04/2018 returns for follow up of T2DM with hx of kidney transplant.    Here with wife.   Trigger to return today was that he needs refill of Freestyle Libre (and medicare requires q8mo visit).   He loves his [name], and is frustrated that [name]  will not dispense until he returns for a visit.  He is aware that some weeks, glucose control is great, other weeks high -- with no clear explanation.   He feels like he is behaving -- eating less, has lost weight (10 lb loss since I last saw him), but HgbA1c has increased to 9.5 which surprises him.  He does feel not going to gym w Covid is making a difference.  Denies old insulin, or exposed to heat. No steroids.   He maintains Trulicity 0.75mg  and eats far less than he use to -- can no longer finish a hamburger. Denies nausea/stomach pain.   Maintains u500 pen same doses as last visit. Despite eating less and losing weight, has not been able to reduce insulin doses.   (9-10am) Bfast DIAL UP TO 105 UNITS (21 CLICKS)   ????????????????????????Reduce to 70 units if premeal glucose 75-100 (14 clicks)  ????????????????????????Reduce 60 units if glucose <??75 AND you are getting ready to eat a full meal (12 clicks)  (2pm) Lunch DIAL UP TO 55 UNITS (11 CLICKS)  May reduce to 6 clicks with smaller meal such as PNButter sandwich.  (7ish) Dinner: DIAL UP TO 40 UNITS (8 CLICKS)  For seafood, reduce to 30units (6 clicks)  ????????????????????????+45 ????units for glucose 201- 300 (9 clicks)  ????????????????????????+50 ????units 301- 400  (10 clicks)  If glucose <??100 at meal, reduce dose by 75%, and if glucose is <??75, reduce dose 50%.     Has been dealing with occ lower BP 80/50s (with dizzyness -- but he said he had just blown his nose while standing). David Ellis has reduced PM dose of metoprolol from 200 to 100mg  x several months.   He feels fine now but everyonce in awhile feels woozy.     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   CKD followed by Nephrology.   Peripheral neuropathy w hx of amputation.  On Neurontin. Has DM shoes with no open wounds on feet.   BP tightly controlled.David Ellis dizzyness if he stands too quickly.   Lipids on low dose pravachol.    ASA only prn on chonic coumadin.    Endocrine Obtained / Updated Problem List:   1. T2DM dx 1992, insulin roughly 2000:   ?? Proliferative DR s/p hx vitreous hemorrhage and endolaser therapy.  ?? CKD s/p renal transplant  ?? 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 No vibratory 11/2018.   ?? Hypoglycemic emergency requiring EMS 03/2011.   ?? Considered Victoza in past, though he was resistant in setting of costs and already multiple daily injections. Trulicity 600$ per mo. Ozempic not covered by Medicare.  ?? Offered to check cpeptide to see if he would qualify for insulin pump under medcare, though he is not interested.    ?? Off MTF since end of 2015/early 2016.   2. 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   3. HTN   4. Nephrolithiasis   5. OSA on cpap   6. Aflutter s/p ablation 06/2011, atrial fibrillation  7. PE    Patient Active Problem List   Diagnosis   ??? Type II diabetes mellitus with renal manifestations, uncontrolled (CMS-HCC)   ??? Hypertension   ??? History of kidney transplant   ??? Mixed hyperlipidemia   ??? 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 Transplanted kidney   ??? Pulmonary embolism (CMS-HCC)   ??? Edema   ???  Right ventricular dysfunction   ??? Atrial fibrillation (CMS-HCC)   ??? Aftercare following organ transplant   ??? Diverticulosis of colon   ??? Routine general medical examination at a health care facility   ??? Diabetic polyneuropathy associated with type 2 diabetes mellitus (CMS-HCC)   ??? Toe amputation status   ??? On statin therapy due to risk of future cardiovascular event   ??? Left knee pain   ??? BCC (basal cell carcinoma), face   ??? Acquired absence of other toe(s), unspecified side (CMS-HCC)   ??? History of colonic polyps   ??? 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 Hypoglycemia associated with type 2 diabetes mellitus (CMS-HCC)   ??? PAD (peripheral artery disease) (CMS-HCC)   ??? Class 2 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   ??? Immunosuppressed status (CMS-HCC)     Past Medical History:   Diagnosis Date   ??? Atrial flutter (CMS-HCC)    ??? Diabetes mellitus (CMS-HCC)    ??? Diabetic nephropathy (CMS-HCC)    ??? Diabetic retinopathy (CMS-HCC)    ??? Fractures    ??? Ganglion cyst    ??? Hand injury    ??? Heart disease    ??? Hyperlipidemia    ??? Hypertension    ??? Joint pain    ??? Osteomyelitis (CMS-HCC) June 2016   ??? Pulmonary embolism (CMS-HCC) Dec 2015   ??? 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 Stroke (CMS-HCC)    ??? Tear of meniscus of knee    ??? Transplanted kidney 08/21/2011     Medicines:     Current Outpatient Medications   Medication Sig Dispense Refill   ??? aspirin (ECOTRIN) 81 MG tablet Take 81 mg by mouth continuous as needed. Only when flying and/or traveling by car more than 2 hours.     ??? BD INSULIN SYRINGE ULTRA-FINE 0.3 mL 31 gauge x 15/64 Syrg 1 each by Other route Three (3) times a day. 300 Syringe 3   ??? BD ULTRA-FINE NANO PEN NEEDLE 32 gauge x 5/32 (4 mm) Ndle USE UP TO 3 TIMES A DAY 100 each 0   ??? blood sugar diagnostic (FREESTYLE PRECISION NEO STRIPS) Strp Use to test blood sugar 4 times daily. Dx E11.65. 150 each 5   ??? chlorthalidone (HYGROTON) 25 MG tablet TAKE ONE TABLET EVERY DAY 60 each 0   ??? cholecalciferol, vitamin D3, (VITAMIN D3) 2,000 unit Tab Take 2,000 Units by mouth daily.      ??? dulaglutide (TRULICITY) 0.75 mg/0.5 mL injection pen Inject 0.5 mL (0.75 mg total) under the skin once a week. 4 Syringe 11   ??? furosemide (LASIX) 40 MG tablet TAKE 1 TO 2 TABLETS ONCE A DAY 180 each 3   ??? gabapentin (NEURONTIN) 300 MG capsule TAKE 1 CAPSULE BY MOUTH TWICE DAILY 180 each 0   ??? HUMULIN R U-500, CONC, KWIKPEN 500 unit/mL (3 mL) CONCENTRATED injection DIAL TO MAX 100 UNITS BEFORE BREAKFAST, LUNCH, AND DINNER.  INJECT BEFOREMEALS MAX OF 300 UNITS DAILY 18 mL 11   ??? hydrocortisone 2.5 % cream   0   ??? irbesartan (AVAPRO) 150 MG tablet TAKE 1 TABLET AT BEDTIME 90 tablet 3   ??? ketoconazole (NIZORAL) 2 % cream   0   ??? lancets Misc 1 each by Miscellaneous route Four (4) times a day. 150 each 6   ??? magnesium chloride (SLOW_MAG) 64 mg TbEC Take 128  mg by mouth three (3) times a day (at 6am, noon and 6pm).      ??? metoprolol succinate (TOPROL-XL) 200 MG 24 hr tablet Take 200 mg in the morning and 100 mg in the evening 135 each 3   ??? multivitamin capsule Take 1 capsule by mouth daily.     ??? MYFORTIC 180 mg EC tablet TAKE 3 TABLETS (540 MG) BY MOUTH TWICE DAILY 180 tablet 35   ??? pravastatin (PRAVACHOL) 20 MG tablet TAKE ONE TABLET BY MOUTH EVERY DAY 90 each 3   ??? PROGRAF 1 mg capsule Take 3 capsules (3mg ) by mouth twice daily. 540 capsule 5   ??? warfarin (COUMADIN) 2.5 MG tablet 2.5 mg. Mondays and Thursdays  5   ??? warfarin (COUMADIN) 5 MG tablet Take 5 mg every day except mondays (Patient taking differently: 5 days a week) 30 tablet 6   ??? blood-glucose meter kit Use as instructed. 1 each 0     No current facility-administered medications for this visit.      Facility-Administered Medications Ordered in Other Visits   Medication Dose Route Frequency Provider Last Rate Last Dose   ??? bevacizumab    PRN (once a day) Melvyn Novas, MD   2.5 mg at 01/28/17 1610       Social History: reviewed: his wife was kidney donor. rare etoh. no cig. 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     Other past medical history, medications, allergies and problem list are reviewed in the medical record    REVIEW OF SYSTEMS: Pertinent positives and negatives as in HPI, otherwise, all other systems reviewed are negative.     OBJECTIVE   Vitals:    11/18/18 0851   BP: 96/56   Pulse: 82   Weight: (!) 116.3 kg (256 lb 8 oz)   Height: 182.9 cm (6' 0.01)     Body mass index is 34.78 kg/m??.    BP Readings from Last 3 Encounters:   11/18/18 96/56   06/26/18 112/72   04/24/18 117/68     Wt Readings from Last 3 Encounters:   11/18/18 (!) 116.3 kg (256 lb 8 oz)   06/26/18 (!) 120.8 kg (266 lb 6.4 oz)   04/24/18 (!) 120.2 kg (265 lb)     PSYCH: calm, cooperative with full affect  EYES: eomi, sclera anicteric  CV: sinus rate, reg rhythm, neither DP nor PT palpable   LE: trace, but not pitting edema, +thickened toe nails, skin intact, right lateral foot / mid 5th metatarsal with bulging foot deformity, left foot 4/5th toes s/p resection  SKIN: significnatly thickened scaley skin on each side of umbilicus, each 1inch lateral to umbilicus   NEURO: no vibratory bilateral      Pertinent labs:  Office Visit on 11/18/2018   Component Date Value Ref Range Status   ??? Glucose, POC 11/18/2018 290* 70 - 179 mg/dL Final   ??? HGB R6E, POC 11/18/2018 9.5* <7.0 % Final    A1c Glycemic Goal**        Age Group              <7%                     >=18 years  **Goals should be individualized; more or less stringent A1c glycemic goals may be appropriate for individual patients.      (Adopted from: 2018 ADA Standards of Medical Care In Diabetes)   Point of Care A1c testing is not FDA-approved  for the diagnosis of Diabetes.   ??? EST AVERAGE GLUCOSE, POC 11/18/2018 226  mg/dL Final   Appointment on 11/04/2018   Component Date Value Ref Range Status   ??? Sodium 11/04/2018 140  135 - 145 mmol/L Final   ??? Potassium 11/04/2018 4.8  3.5 - 5.0 mmol/L Final   ??? Chloride 11/04/2018 97* 98 - 107 mmol/L Final   ??? Anion Gap 11/04/2018 14  7 - 15 mmol/L Final   ??? CO2 11/04/2018 29.0  22.0 - 30.0 mmol/L Final   ??? BUN 11/04/2018 49* 7 - 21 mg/dL Final   ??? Creatinine 11/04/2018 1.65* 0.70 - 1.30 mg/dL Final   ??? BUN/Creatinine Ratio 11/04/2018 30   Final   ??? EGFR CKD-EPI Non-African American,* 11/04/2018 41* >=60 mL/min/1.42m2 Final   ??? EGFR CKD-EPI African American, Male 11/04/2018 48* >=60 mL/min/1.27m2 Final   ??? Glucose 11/04/2018 357* 70 - 179 mg/dL Final   ??? Calcium 16/03/9603 9.4  8.5 - 10.2 mg/dL Final   ??? Albumin 54/02/8118 3.8  3.5 - 5.0 g/dL Final   ??? Total Protein 11/04/2018 6.7  6.5 - 8.3 g/dL Final   ??? Total Bilirubin 11/04/2018 0.8  0.0 - 1.2 mg/dL Final   ??? AST 14/78/2956 28  19 - 55 U/L Final   ??? ALT 11/04/2018 18  <50 U/L Final   ??? Alkaline Phosphatase 11/04/2018 82  38 - 126 U/L Final   ??? PT 11/04/2018 41.9* 10.2 - 13.1 sec Final   ??? INR 11/04/2018 3.55   Final   ??? Phosphorus 11/04/2018 3.4  2.9 - 4.7 mg/dL Final   ??? Tacrolimus, Timed 11/04/2018 7.8  ng/mL Final   ??? WBC 11/04/2018 6.9  4.5 - 11.0 10*9/L Final   ??? RBC 11/04/2018 5.16  4.50 - 5.90 10*12/L Final   ??? HGB 11/04/2018 15.1  13.5 - 17.5 g/dL Final   ??? HCT 21/30/8657 44.6  41.0 - 53.0 % Final   ??? MCV 11/04/2018 86.5  80.0 - 100.0 fL Final   ??? MCH 11/04/2018 29.3  26.0 - 34.0 pg Final   ??? MCHC 11/04/2018 33.9  31.0 - 37.0 g/dL Final   ??? RDW 84/69/6295 14.5  12.0 - 15.0 % Final   ??? MPV 11/04/2018 8.8  7.0 - 10.0 fL Final   ??? Platelet 11/04/2018 217  150 - 440 10*9/L Final   ??? Neutrophils % 11/04/2018 80.0  % Final   ??? Lymphocytes % 11/04/2018 12.2  % Final   ??? Monocytes % 11/04/2018 5.5  % Final   ??? Eosinophils % 11/04/2018 1.0  % Final   ??? Basophils % 11/04/2018 0.3  % Final   ??? Absolute Neutrophils 11/04/2018 5.5  2.0 - 7.5 10*9/L Final   ??? Absolute Lymphocytes 11/04/2018 0.8* 1.5 - 5.0 10*9/L Final   ??? Absolute Monocytes 11/04/2018 0.4  0.2 - 0.8 10*9/L Final   ??? Absolute Eosinophils 11/04/2018 0.1  0.0 - 0.4 10*9/L Final   ??? Absolute Basophils 11/04/2018 0.0  0.0 - 0.1 10*9/L Final   ??? Large Unstained Cells 11/04/2018 1  0 - 4 % Final     Lab Results   Component Value Date    Hemoglobin A1C 8.3 (H) 08/20/2018    Hemoglobin A1C 8.1 (H) 07/24/2018    Hemoglobin A1C 8.1 (H) 06/10/2018    Hemoglobin A1C 8.8 (H) 01/23/2018    Hemoglobin A1C 6.7 (H) 08/04/2014    Hemoglobin A1C 7.5 07/20/2014    Hemoglobin A1C 7.1 (H) 07/04/2014  Hemoglobin A1C 8.0 (H) 11/23/2013    HB A1C, RAP/HGATE 6.7 (H) 04/07/2012    HB A1C, RAP/HGATE 7.4 (H) 03/30/2011    HGB A1C, POC 9.5 (H) 11/18/2018    HGB A1C, POC 9.3 (H) 04/24/2018    HGB A1C, POC 8.0 (H) 10/03/2015    Hemoglobin A1c 7.6 (H) 05/17/2014     No components found for: SODIUM  Lab Results   Component Value Date    K 4.8 11/04/2018     Lab Results   Component Value Date    CREATININE 1.65 (H) 11/04/2018     Lab Results   Component Value Date    PTH 192.3 (H) 11/27/2017    CALCIUM 9.4 11/04/2018     Lab Results   Component Value Date    ALT 18 11/04/2018    ALT 22 04/21/2015     Lab Results   Component Value Date    CHOL 132 08/20/2018     Lab Results   Component Value Date    LDL 46 (L) 08/20/2018     Lab Results   Component Value Date    HDL 25 (L) 08/20/2018     Lab Results   Component Value Date    TRIG 304 (H) 08/20/2018     Lab Results   Component Value Date TSH 2.202 07/02/2017    TSH 1.840 07/13/2015    TSH 1.420 07/09/2015     Lab Results   Component Value Date    TSH 2.202 07/02/2017     No results found for: CBC  No results found for: VITAMINB12  Lab Results   Component Value Date    VITD2 <5 08/17/2015    VITD3 42 08/17/2015    VITDTOTAL 33.8 03/01/2017     No results found for: Christa See, MSHCGMOM, MALBCRERAT  Lab Results   Component Value Date    Protein, Ur 5.1 03/27/2018    Protein, Ur 15 08/05/2014    Protein/Creatinine Ratio, Urine 0.037 03/27/2018    Protein/Creatinine Ratio, Urine 0.116 08/05/2014       IMAGING:

## 2018-11-20 MED ORDER — PEN NEEDLE, DIABETIC 32 GAUGE X 5/32" (4 MM)
Freq: Three times a day (TID) | 3 refills | 0.00000 days | Status: CP
Start: 2018-11-20 — End: ?

## 2018-11-20 MED FILL — MYFORTIC 180 MG TABLET,DELAYED RELEASE: 30 days supply | Qty: 180 | Fill #11

## 2018-11-20 MED FILL — MYFORTIC 180 MG TABLET,DELAYED RELEASE: 30 days supply | Qty: 180 | Fill #11 | Status: AC

## 2018-11-20 MED FILL — PROGRAF 1 MG CAPSULE: 30 days supply | Qty: 180 | Fill #1 | Status: AC

## 2018-11-20 MED FILL — PROGRAF 1 MG CAPSULE: 30 days supply | Qty: 180 | Fill #1

## 2018-11-27 ENCOUNTER — Telehealth: Admit: 2018-11-27 | Discharge: 2018-11-28 | Payer: MEDICARE

## 2018-11-27 DIAGNOSIS — I10 Essential (primary) hypertension: Secondary | ICD-10-CM

## 2018-11-27 DIAGNOSIS — Z79899 Other long term (current) drug therapy: Secondary | ICD-10-CM

## 2018-11-27 DIAGNOSIS — Z940 Kidney transplant status: Principal | ICD-10-CM

## 2018-11-27 DIAGNOSIS — N183 Chronic kidney disease, stage 3 (moderate): Secondary | ICD-10-CM

## 2018-11-27 NOTE — Unmapped (Signed)
PCP:  David Addison, MD      ASSESSMENT/PLAN: [name] is a 73 y.o. 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    1. S/P LURD kidney transplant 2013 with baseline creatinine ~1.6-2.0. Renal function within baseline on most recent labs from 11/04/2018. His tac trough is within goal of 6-10. His electrolytes are WNL. Urine dipstick negative for proteinuria and most recent UACR (03/2018) was within normal range. Has had 3 prior biopsies all in 2013 revealing moderate to severe arteriosclerosis. The patient has a history of a DQ 7 donor specific antibody noted on 09/17/2011 with all subsequent HLA antibody screens revealing no DSA's. Continue current dose of mycophenolate and tacrolimus. Urine cytology from October 2019 was negative for decoy cells.     3. HTN. Home blood pressures have been running low (80's/50's). Will reduce Toprol- XL to 200 mg daily today. If SBP continues to be <110, will reduce Metoprolol to 100 mg daily.     4. T2DM. Uncontrolled. Very insulin resistant and has had to be more sedentary over last several years due to foot wounds and mobility problems. Follows with Dr. Tiburcio Ellis in endocrinology. Goal HbA1c < 8%, last 9.5% on 11/18/2018. Continue Trulicity dose and monitor for hypoglycemia - will d/w Dr. Tiburcio Ellis. History of proliferative retinopathy s/p PPV/endolaser treatments, last evaluated by ophthalmology in 01/2018 and has f/u scheduled next month.    5. ASHD/CVA history. Continue pravastatin.     6. A-Fib. Continue coumadin. INR f/b Dr. Dellis Ellis.    7. HM: Had his flu shot and UTD on pneumonia and shinrix. Due for colonoscopy but reportedly GI is leary of stopping anticoagulation so are following cologuard test results. No FH colon CA. Continue biannual follow-up with dermatology re: recurrent basal cell CA.    9. High falls risk: No recent falls. Has walker and cane.    [name] will return to see me in 3months.  ______________________________________________________________________________________________________    HPI: [name] is a 73 y.o. man s/p LURD kidney transplant (from his wife, David Ellis) 08/21/2011 and B/L creatinine ~1.4-1.7 who returns for f/u. 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     He has had multiple complications since his transplant including a large, provoked (long car ride) pulmonary embolus requiring thrombolysis (Dec 2014), osteomyelitis of his left foot requiring 4th/5th toe amputation (2016), recurrent basal cell CA, CVA s/p thombolysis (07/2015). He has significant PAD and neuropathy and has no sensation on either leg below his knees. He also has severe retinopathy and his vision is quite poor so no longer drives.     David Ellis is being seen today for monitoring and treatment of his kidney transplant and hypertensionl. He does report that blood pressures have been low recently (80-100/50-70) with associated dizziness and lightheadedness. His Metoprolol was decreased by Dr. Dellis Ellis (to 200mg  qam/100mg qpm) but his home BPs have continued to run low. Currently on Trulicity, which patient has tolerated well with some improvement in blood sugars. He also notes having lost weight since starting Trulicity -- current weight is 255 lbs.    Otherwise no acute complaints of CP, SOB, cough,  palpitations, N/V/D. His edema is pretty much gone he says.    ROS: As per HPI. The remainder of the 10 system review is negative.    PAST MEDICAL HISTORY:  Past Medical History:   Diagnosis Date   ??? Atrial flutter (CMS-HCC)    ??? Diabetes mellitus (CMS-HCC)    ??? Diabetic nephropathy (CMS-HCC)    ??? Diabetic retinopathy (CMS-HCC)    ??? Fractures    ??? Ganglion cyst    ??? Hand injury    ??? Heart disease    ??? Hyperlipidemia    ??? Hypertension    ??? Joint pain    ??? Osteomyelitis (CMS-HCC) June 2016   ??? Pulmonary embolism (CMS-HCC) Dec 2015   ??? 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 Stroke (CMS-HCC)    ??? Tear of meniscus of knee    ??? Transplanted kidney 08/21/2011       ALLERGIES  Levofloxacin; Lidocaine; Penicillins; Egg derived; Enalapril; Epinephrine; Grass pollen-bermuda, standard; Lisinopril; and Mepivacaine hcl    MEDICATIONS:  Current Outpatient Medications   Medication Sig Dispense Refill   ??? aspirin (ECOTRIN) 81 MG tablet Take 81 mg by mouth continuous as needed. Only when flying and/or traveling by car more than 2 hours.     ??? BD INSULIN SYRINGE ULTRA-FINE 0.3 mL 31 gauge x 15/64 Syrg 1 each by Other route Three (3) times a day. 300 Syringe 3   ??? blood sugar diagnostic (FREESTYLE PRECISION NEO STRIPS) Strp Use to test blood sugar 4 times daily. Dx E11.65. 150 each 5   ??? blood-glucose meter kit Use as instructed. 1 each 0   ??? chlorthalidone (HYGROTON) 25 MG tablet TAKE ONE TABLET EVERY DAY 60 each 0   ??? cholecalciferol, vitamin D3, (VITAMIN D3) 2,000 unit Tab Take 2,000 Units by mouth daily.      ??? dulaglutide (TRULICITY) 0.75 mg/0.5 mL injection pen Inject 0.5 mL (0.75 mg total) under the skin once a week. 4 Syringe 11   ??? furosemide (LASIX) 40 MG tablet TAKE 1 TO 2 TABLETS ONCE A DAY 180 each 3   ??? gabapentin (NEURONTIN) 300 MG capsule TAKE 1 CAPSULE BY MOUTH TWICE DAILY 180 each 0   ??? HUMULIN R U-500, CONC, KWIKPEN 500 unit/mL (3 mL) CONCENTRATED injection DIAL TO MAX 100 UNITS BEFORE BREAKFAST, LUNCH, AND DINNER.  INJECT BEFOREMEALS MAX OF 300 UNITS DAILY 18 mL 11   ??? hydrocortisone 2.5 % cream   0   ??? irbesartan (AVAPRO) 150 MG tablet TAKE 1 TABLET AT BEDTIME 90 tablet 3   ??? ketoconazole (NIZORAL) 2 % cream   0 ??? lancets Misc 1 each by Miscellaneous route Four (4) times a day. 150 each 6   ??? magnesium chloride (SLOW_MAG) 64 mg TbEC Take 128 mg by mouth three (3) times a day (at 6am, noon and 6pm).      ??? metoprolol succinate (TOPROL-XL) 200 MG 24 hr tablet Take 200 mg in the morning and 100 mg in the evening 135 each 3   ??? multivitamin capsule Take 1 capsule by mouth daily.     ??? MYFORTIC 180 mg EC tablet TAKE 3 TABLETS (540 MG) BY MOUTH TWICE DAILY 180 tablet 35   ??? pen needle, diabetic (BD ULTRA-FINE NANO PEN NEEDLE) 32 gauge x 5/32 (4 mm) Ndle 1 each by Other route Three (3) times a day before meals. 300 each 3   ???  pravastatin (PRAVACHOL) 20 MG tablet TAKE ONE TABLET BY MOUTH EVERY DAY 90 each 3   ??? PROGRAF 1 mg capsule Take 3 capsules (3mg ) by mouth twice daily. 540 capsule 5   ??? warfarin (COUMADIN) 2.5 MG tablet 2.5 mg. Mondays and Thursdays  5     No current facility-administered medications for this visit.      Facility-Administered Medications Ordered in Other Visits   Medication Dose Route Frequency Provider Last Rate Last Dose   ??? bevacizumab    PRN (once a day) Melvyn Novas, MD   2.5 mg at 01/28/17 0750     No physical exam due to the remote nature of this visit during the covid19 pandemic.    MEDICAL DECISION MAKING    Results for orders placed or performed in visit on 11/18/18   POCT Glucose   Result Value Ref Range    Glucose, POC 290 (H) 70 - 179 mg/dL   POCT Glycosylated Hemoglobin (HGB A1c)   Result Value Ref Range    HGB A1C, POC 9.5 (H) <7.0 %    EST AVERAGE GLUCOSE, POC 226 mg/dL     *Note: Due to a large number of results and/or encounters for the requested time period, some results have not been displayed. A complete set of results can be found in Results Review.        Lab Results   Component Value Date    NA 140 11/04/2018    K 4.8 11/04/2018    CL 97 (L) 11/04/2018    CO2 29.0 11/04/2018    BUN 49 (H) 11/04/2018    CREATININE 1.65 (H) 11/04/2018    GFRAA 51 (L) 10/29/2017    GFRNONAA 42 (L) 10/29/2017    ALBUMIN 3.8 11/04/2018     Lab Results   Component Value Date    WBC 6.9 11/04/2018    HGB 15.1 11/04/2018    HCT 44.6 11/04/2018    PLT 217 11/04/2018     Lab Results   Component Value Date    PTH 192.3 (H) 11/27/2017    CALCIUM 9.4 11/04/2018    PHOS 3.4 11/04/2018       Scribe's Attestation: Trey Sailors, MD obtained and performed the history, physical exam and medical decision making elements that were entered into the chart.  Signed by Delaney Meigs, Scribe, on November 27, 2018 9:04 AM    Attending Statement: Documentation assistance provided by the Scribe. I was present during the time the encounter was recorded. The information recorded by the Scribe was done at my direction and has been reviewed and validated by me.    I spent 32 minutes on the audio/video with the patient. I spent an additional 10 minutes on pre- and post-visit activities.     The patient was physically located in [place] or a state in which I am permitted to provide care. The patient and/or parent/guardian understood that s/he may incur co-pays and cost sharing, and agreed to the telemedicine visit. The visit was reasonable and appropriate under the circumstances given the patient's presentation at the time.    The patient and/or parent/guardian has been advised of the potential risks and limitations of this mode of treatment (including, but not limited to, the absence of in-person examination) and has agreed to be treated using telemedicine. The patient's/patient's family's questions regarding telemedicine have been answered.     If the visit was completed in an ambulatory setting, the patient and/or parent/guardian has also been advised  to contact their provider???s office for worsening conditions, and seek emergency medical treatment and/or call 911 if the patient deems either necessary.

## 2018-12-03 MED ORDER — HUMULIN R U-500 (CONC) INSULIN KWIKPEN 500 UNIT/ML (3 ML) SUBCUTANEOUS
11 refills | 0 days | Status: CP
Start: 2018-12-03 — End: ?

## 2018-12-04 ENCOUNTER — Ambulatory Visit: Admit: 2018-12-04 | Discharge: 2018-12-05 | Payer: MEDICARE

## 2018-12-04 DIAGNOSIS — Z940 Kidney transplant status: Principal | ICD-10-CM

## 2018-12-04 LAB — CBC W/ AUTO DIFF
BASOPHILS ABSOLUTE COUNT: 0 10*9/L (ref 0.0–0.1)
BASOPHILS RELATIVE PERCENT: 0.4 %
EOSINOPHILS ABSOLUTE COUNT: 0.1 10*9/L (ref 0.0–0.4)
EOSINOPHILS RELATIVE PERCENT: 1.1 %
HEMATOCRIT: 44.4 % (ref 41.0–53.0)
HEMOGLOBIN: 14.2 g/dL (ref 13.5–17.5)
LARGE UNSTAINED CELLS: 1 % (ref 0–4)
LYMPHOCYTES ABSOLUTE COUNT: 0.8 10*9/L — ABNORMAL LOW (ref 1.5–5.0)
MEAN CORPUSCULAR HEMOGLOBIN CONC: 32 g/dL (ref 31.0–37.0)
MEAN CORPUSCULAR HEMOGLOBIN: 27.9 pg (ref 26.0–34.0)
MEAN CORPUSCULAR VOLUME: 87.1 fL (ref 80.0–100.0)
MEAN PLATELET VOLUME: 8.4 fL (ref 7.0–10.0)
MONOCYTES ABSOLUTE COUNT: 0.5 10*9/L (ref 0.2–0.8)
MONOCYTES RELATIVE PERCENT: 5.6 %
NEUTROPHILS ABSOLUTE COUNT: 6.6 10*9/L (ref 2.0–7.5)
NEUTROPHILS RELATIVE PERCENT: 81.5 %
PLATELET COUNT: 204 10*9/L (ref 150–440)
WBC ADJUSTED: 8.1 10*9/L (ref 4.5–11.0)

## 2018-12-04 LAB — TACROLIMUS BLOOD: Lab: 7.6

## 2018-12-04 LAB — COMPREHENSIVE METABOLIC PANEL
ALBUMIN: 3.7 g/dL (ref 3.5–5.0)
ALKALINE PHOSPHATASE: 77 U/L (ref 38–126)
ALT (SGPT): 18 U/L (ref <50)
ANION GAP: 9 mmol/L (ref 7–15)
AST (SGOT): 27 U/L (ref 19–55)
BILIRUBIN TOTAL: 0.9 mg/dL (ref 0.0–1.2)
BLOOD UREA NITROGEN: 38 mg/dL — ABNORMAL HIGH (ref 7–21)
BUN / CREAT RATIO: 22
CALCIUM: 9.3 mg/dL (ref 8.5–10.2)
CHLORIDE: 99 mmol/L (ref 98–107)
CO2: 27 mmol/L (ref 22.0–30.0)
CREATININE: 1.7 mg/dL — ABNORMAL HIGH (ref 0.70–1.30)
EGFR CKD-EPI AA MALE: 46 mL/min/{1.73_m2} — ABNORMAL LOW (ref >=60)
EGFR CKD-EPI NON-AA MALE: 40 mL/min/{1.73_m2} — ABNORMAL LOW (ref >=60)
POTASSIUM: 5 mmol/L (ref 3.5–5.0)
PROTEIN TOTAL: 6.9 g/dL (ref 6.5–8.3)
SODIUM: 135 mmol/L (ref 135–145)

## 2018-12-04 LAB — PROTIME-INR: PROTIME: 26.9 s — ABNORMAL HIGH (ref 10.2–13.1)

## 2018-12-04 LAB — BUN / CREAT RATIO: Urea nitrogen/Creatinine:MRto:Pt:Ser/Plas:Qn:: 22

## 2018-12-04 LAB — PROTIME: Lab: 26.9 — ABNORMAL HIGH

## 2018-12-04 LAB — MEAN CORPUSCULAR HEMOGLOBIN CONC: Lab: 32

## 2018-12-04 LAB — PHOSPHORUS: Phosphate:MCnc:Pt:Ser/Plas:Qn:: 2.8 — ABNORMAL LOW

## 2018-12-04 NOTE — Unmapped (Signed)
LVM for pt to call back and do pre charting.

## 2018-12-08 NOTE — Unmapped (Addendum)
REduce bfast to 19 clicks, lunch to 9 clicks, and dinner to 6 clicks.   If glucose is < 80 before the meal, reduce dose by 50%.

## 2018-12-08 NOTE — Unmapped (Signed)
ENDOCRINE/DIABETES TELEVISIT (BY PHONE)   I spent 20 minutes on the phone with the patient. I spent an additional 5 minutes on pre- and post-visit activities.   The patient was physically located in [place] or a state in which I am permitted to provide care. The patient and/or parent/guardian understood that s/he may incur co-pays and cost sharing, and agreed to the telemedicine visit. The visit was reasonable and appropriate under the circumstances given the patient's presentation at the time.  The patient and/or parent/guardian has been advised of the potential risks and limitations of this mode of treatment (including, but not limited to, the absence of in-person examination) and has agreed to be treated using telemedicine. The patient's/patient's family's questions regarding telemedicine have been answered.   If the visit was completed in an ambulatory setting, the patient and/or parent/guardian has also been advised to contact their provider???s office for worsening conditions, and seek emergency medical treatment and/or call 911 if the patient deems either necessary.    ASSESSMENT / PLAN  1. Type II diabetes mellitus with renal manifestations, uncontrolled (CMS-HCC)  Unable to download data from his freestyle [place] today, but on review of his self recorded POC glucose checks, he has dramatic improvement in glucoses since changing to alternative site testing, now with some hypoglycemia which has led him to start reducing insulin on his own.   Suspect increased risk for hypoglycemia at bedtime related to stacking of doses of u500 as day progresses.  Presume part of reduced insulin requirements is also associated with use of Trulicity and weight loss for which we will recommend reduced doses of insulin.   He will follow up with Derm for what I believe is  Junita Push dermatosis or retention hyperkeratosis on abdomen.   For continued use of u500 pen will try and simplify:  Reduce bfast to 19 clicks (95units), reduce lunch to 9 clicks (45units), and dinner to 6 clicks (30units).   If glucose is < 80 before the meal, reduce dose by 50%.     2. PAD (peripheral artery disease) (CMS-HCC)  3. Diabetic polyneuropathy associated with type 2 diabetes mellitus (CMS-HCC)  See 11/2018 note for full documentation needed for Medicare coverage of DM shoes.    MEDICARE:   *The patient requires a therapeutic CGM.   *The patient has diabetes mellitus and is insulin-treated with 3 or more daily injections of insulin(MDII).   *The patient has been using CGM and is checking glucose 4 or more times per day x 30 days.  *The patient???s insulin treatment regimen requires frequent adjustments by the patient on the basis of therapeutic CGM testing results.   *Within the last six months prior to ordering the CGM, I had an in-person visit with the beneficiary to evaluate their diabetes control and determine that the above criteria are met.   *Every six months following the initial prescription of the CGM, I will have an in-person visit with the beneficiary to assess adherence to their CGM regimen and diabetes treatment plan.      Return in about 3 months (around 03/10/2019).    SUBJECTIVE  PCP: [name]  Tplant: Amy Mottle  Optho:  Herma Ard  Wound: Keagy  Podiatry: Dr. Ether Griffins, [facility] Potters     HPI: David Ellis is a 73 y.o. male who I last saw 11/2018 returns for follow up of T2DM with hx of kidney transplant.    He sent glucose and insulin data via PDF to my email.   He  was not able to set up download of his [facility] -- he has an account, and we invited him in, but the two did not mix.   He has dramatically improved glucoses since he started using alterative sites for injections -- now legs, arms, etc, not abdomen where we identified thickened skin at last visit on exam suggestive of [place]-forme dermatosis.  He reports he felt the low of 52, and another 62 or 82 at bedtime which he treats with protein bar before going to bed.  Denies any significant nocturnal hypoglycemia on review of Libre the next morning.     Is losing weight (270 --> 254 lbs) he attributes to less appetite on Trulicity.     Prior Regimen:  (9-10am) Bfast DIAL UP TO 105 UNITS (21 CLICKS)   ????????????????????????Reduce to 70 units if premeal glucose 75-100 (14 clicks)  ????????????????????????Reduce 60 units if glucose <??75 AND you are getting ready to eat a full meal (12 clicks)  (2pm) Lunch DIAL UP TO 55 UNITS (11 CLICKS)  May reduce to 6 clicks with smaller meal such as PNButter sandwich.  (7ish) Dinner: DIAL UP TO 40 UNITS (8 CLICKS)  For seafood, reduce to 30units (6 clicks)  ????????????????????????+45 ????units for glucose 201- 300 (9 clicks)  ????????????????????????+50 ????units 301- 400  (10 clicks)  If glucose <??100 at meal, reduce dose by 75%, and if glucose is <??75, reduce dose 50%.     More Recently:  Yesterday, he woke w glucose 90s, so only took 10 clicks w bfast.   With lunch, he may reduce to as few as 4 clicks if glucoses 50-60 pre meal.   With dinner, he can also reduce to 4-8 clicks.     He is due to see Derm next week.     Prior visit:  Hx of Proliferative DR s/p hemorrhage and laser therapy.   CKD followed by Nephrology.   Peripheral neuropathy w hx of amputation.  On Neurontin. Has DM shoes. Documentation to proceed with new shoes as of 11/2018 exam and note.   BP tightly controlled.Hiram Gash dizzyness if he stands too quickly.   Lipids on low dose pravachol.    ASA only prn on chonic coumadin.    Endocrine Obtained / Updated Problem List:   1. T2DM dx 1992, insulin roughly 2000:   ?? Proliferative DR s/p hx vitreous hemorrhage and endolaser therapy.  ?? CKD s/p renal transplant  ?? 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 No vibratory 11/2018.   ?? Hypoglycemic emergency requiring EMS 03/2011.   ?? Considered Victoza in past, though he was resistant in setting of costs and already multiple daily injections. Trulicity 600$ per mo. Ozempic not covered by Medicare.  ?? Offered to check cpeptide to see if he would qualify for insulin pump under medcare, though he is not interested.    ?? Off MTF since end of 2015/early 2016.   2. 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   3. HTN   4. Nephrolithiasis   5. OSA on cpap   6. Aflutter s/p ablation 06/2011, atrial fibrillation  7. PE    Patient Active Problem List   Diagnosis   ??? Type II diabetes mellitus with renal manifestations, uncontrolled (CMS-HCC)   ??? Hypertension   ??? History of kidney transplant   ??? Mixed hyperlipidemia   ??? 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 Transplanted kidney   ???  Pulmonary embolism (CMS-HCC)   ??? Edema   ??? Right ventricular dysfunction   ??? Atrial fibrillation (CMS-HCC)   ??? Aftercare following organ transplant   ??? Diverticulosis of colon   ??? Routine general medical examination at a health care facility   ??? Diabetic polyneuropathy associated with type 2 diabetes mellitus (CMS-HCC)   ??? Toe amputation status   ??? On statin therapy due to risk of future cardiovascular event   ??? Left knee pain   ??? BCC (basal cell carcinoma), face   ??? Acquired absence of other toe(s), unspecified side (CMS-HCC)   ??? History of colonic polyps   ??? 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 Hypoglycemia associated with type 2 diabetes mellitus (CMS-HCC)   ??? PAD (peripheral artery disease) (CMS-HCC)   ??? Class 2 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   ??? Immunosuppressed status (CMS-HCC)     Past Medical History:   Diagnosis Date   ??? Atrial flutter (CMS-HCC)    ??? Diabetes mellitus (CMS-HCC)    ??? Diabetic nephropathy (CMS-HCC)    ??? Diabetic retinopathy (CMS-HCC)    ??? Fractures    ??? Ganglion cyst    ??? Hand injury    ??? Heart disease    ??? Hyperlipidemia    ??? Hypertension    ??? Joint pain    ??? Osteomyelitis (CMS-HCC) June 2016   ??? Pulmonary embolism (CMS-HCC) Dec 2015   ??? 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 Stroke (CMS-HCC)    ??? Tear of meniscus of knee    ??? Transplanted kidney 08/21/2011     Medicines:     Current Outpatient Medications   Medication Sig Dispense Refill   ??? aspirin (ECOTRIN) 81 MG tablet Take 81 mg by mouth continuous as needed. Only when flying and/or traveling by car more than 2 hours.     ??? BD INSULIN SYRINGE ULTRA-FINE 0.3 mL 31 gauge x 15/64 Syrg 1 each by Other route Three (3) times a day. 300 Syringe 3   ??? blood sugar diagnostic (FREESTYLE PRECISION NEO STRIPS) Strp Use to test blood sugar 4 times daily. Dx E11.65. 150 each 5   ??? blood-glucose meter kit Use as instructed. 1 each 0   ??? chlorthalidone (HYGROTON) 25 MG tablet TAKE ONE TABLET EVERY DAY 60 each 0   ??? cholecalciferol, vitamin D3, (VITAMIN D3) 2,000 unit Tab Take 2,000 Units by mouth daily.      ??? dulaglutide (TRULICITY) 0.75 mg/0.5 mL injection pen Inject 0.5 mL (0.75 mg total) under the skin once a week. 4 Syringe 11   ??? furosemide (LASIX) 40 MG tablet TAKE 1 TO 2 TABLETS ONCE A DAY 180 each 3   ??? gabapentin (NEURONTIN) 300 MG capsule TAKE 1 CAPSULE BY MOUTH TWICE DAILY 180 each 0   ??? HUMULIN R U-500, CONC, KWIKPEN 500 unit/mL (3 mL) CONCENTRATED injection DIAL TO MAX 100 UNITS BEFORE BREAKFAST, LUNCH, AND DINNER.  INJECT BEFOREMEALS MAX OF 300 UNITS DAILY 18 mL 11   ??? hydrocortisone 2.5 % cream   0   ??? irbesartan (AVAPRO) 150 MG tablet TAKE 1 TABLET AT BEDTIME 90 tablet 3   ??? ketoconazole (NIZORAL) 2 % cream   0   ??? magnesium chloride (SLOW_MAG) 64 mg TbEC Take 128 mg by mouth three (3) times a day (at 6am, noon and 6pm).      ??? metoprolol succinate (  TOPROL-XL) 200 MG 24 hr tablet Take 200 mg in the morning and 100 mg in the evening 135 each 3   ??? multivitamin capsule Take 1 capsule by mouth daily.     ??? MYFORTIC 180 mg EC tablet TAKE 3 TABLETS (540 MG) BY MOUTH TWICE DAILY 180 tablet 35   ??? pen needle, diabetic (BD ULTRA-FINE NANO PEN NEEDLE) 32 gauge x 5/32 (4 mm) Ndle 1 each by Other route Three (3) times a day before meals. 300 each 3   ??? pravastatin (PRAVACHOL) 20 MG tablet TAKE ONE TABLET BY MOUTH EVERY DAY 90 each 3   ??? PROGRAF 1 mg capsule Take 3 capsules (3mg ) by mouth twice daily. 540 capsule 5   ??? warfarin (COUMADIN) 2.5 MG tablet 2.5 mg. Mondays and Thursdays  5     No current facility-administered medications for this visit.      Facility-Administered Medications Ordered in Other Visits   Medication Dose Route Frequency Provider Last Rate Last Dose   ??? bevacizumab    PRN (once a day) Melvyn Novas, MD   2.5 mg at 01/28/17 1610       Social History: reviewed: his wife was kidney donor. rare etoh. no cig. 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     Other past medical history, medications, allergies and problem list are reviewed in the medical record    REVIEW OF SYSTEMS: Pertinent positives and negatives as in HPI, otherwise, all other systems reviewed are negative.     OBJECTIVE   There were no vitals filed for this visit.  There is no height or weight on file to calculate BMI.    BP Readings from Last 3 Encounters:   11/18/18 96/56   06/26/18 112/72   04/24/18 117/68     Wt Readings from Last 3 Encounters:   11/18/18 (!) 116.3 kg (256 lb 8 oz)   06/26/18 (!) 120.8 kg (266 lb 6.4 oz)   04/24/18 (!) 120.2 kg (265 lb)     PSYCH: calm, cooperative with full affect    Pertinent labs:  Office Visit on 11/18/2018   Component Date Value Ref Range Status   ??? Glucose, POC 11/18/2018 290* 70 - 179 mg/dL Final   ??? HGB R6E, POC 11/18/2018 9.5* <7.0 % Final    A1c Glycemic Goal**        Age Group              <7%                     >=18 years  **Goals should be individualized; more or less stringent A1c glycemic goals may be appropriate for individual patients.      (Adopted from: 2018 ADA Standards of Medical Care In Diabetes)   Point of Care A1c testing is not FDA-approved for the diagnosis of Diabetes.   ??? EST AVERAGE GLUCOSE, POC 11/18/2018 226  mg/dL Final     Lab Results   Component Value Date    Hemoglobin A1C 8.3 (H) 08/20/2018 Hemoglobin A1C 8.1 (H) 07/24/2018    Hemoglobin A1C 8.1 (H) 06/10/2018    Hemoglobin A1C 8.8 (H) 01/23/2018    Hemoglobin A1C 6.7 (H) 08/04/2014    Hemoglobin A1C 7.5 07/20/2014    Hemoglobin A1C 7.1 (H) 07/04/2014    Hemoglobin A1C 8.0 (H) 11/23/2013    HB A1C, RAP/HGATE 6.7 (H) 04/07/2012    HB A1C, RAP/HGATE 7.4 (H) 03/30/2011    HGB A1C, POC 9.5 (H) 11/18/2018  HGB A1C, POC 9.3 (H) 04/24/2018    HGB A1C, POC 8.0 (H) 10/03/2015    Hemoglobin A1c 7.6 (H) 05/17/2014     No components found for: SODIUM  Lab Results   Component Value Date    K 5.0 12/04/2018     Lab Results   Component Value Date    CREATININE 1.70 (H) 12/04/2018     Lab Results   Component Value Date    PTH 192.3 (H) 11/27/2017    CALCIUM 9.3 12/04/2018     Lab Results   Component Value Date    ALT 18 12/04/2018    ALT 22 04/21/2015     Lab Results   Component Value Date    CHOL 132 08/20/2018     Lab Results   Component Value Date    LDL 46 (L) 08/20/2018     Lab Results   Component Value Date    HDL 25 (L) 08/20/2018     Lab Results   Component Value Date    TRIG 304 (H) 08/20/2018     Lab Results   Component Value Date    TSH 2.202 07/02/2017    TSH 1.840 07/13/2015    TSH 1.420 07/09/2015     Lab Results   Component Value Date    TSH 2.202 07/02/2017     No results found for: CBC  No results found for: VITAMINB12  Lab Results   Component Value Date    VITD2 <5 08/17/2015    VITD3 42 08/17/2015    VITDTOTAL 33.8 03/01/2017     No results found for: Christa See, MSHCGMOM, MALBCRERAT  Lab Results   Component Value Date    Protein, Ur 5.1 03/27/2018    Protein, Ur 15 08/05/2014    Protein/Creatinine Ratio, Urine 0.037 03/27/2018    Protein/Creatinine Ratio, Urine 0.116 08/05/2014       IMAGING:

## 2018-12-09 NOTE — Unmapped (Signed)
-----   Message from Sharlee Blew, MD sent at 12/07/2018  7:24 AM EDT -----  INR ok.  Kidneys stable.    Repeat INR one month.    [name], M.D.     [facility]   [address]  [address]  [place], [place]  [address]  [phone]

## 2018-12-10 NOTE — Unmapped (Signed)
Patient notified and has scheduled the repeat inr in one month.

## 2018-12-12 MED ORDER — WARFARIN 2.5 MG TABLET
12 refills | 0 days | Status: CP
Start: 2018-12-12 — End: ?

## 2018-12-12 MED ORDER — MYFORTIC 180 MG TABLET,DELAYED RELEASE
ORAL_TABLET | Freq: Two times a day (BID) | ORAL | 35 refills | 30 days | Status: CP
Start: 2018-12-12 — End: 2019-12-12
  Filled 2018-12-18: qty 180, 30d supply, fill #0

## 2018-12-12 MED ORDER — WARFARIN 5 MG TABLET
12 refills | 0 days | Status: CP
Start: 2018-12-12 — End: ?

## 2018-12-12 NOTE — Unmapped (Signed)
Pt request for RX Refill

## 2018-12-12 NOTE — Unmapped (Addendum)
[facility] Refill Coordination Note    Specialty Medication(s) to be Shipped:   Transplant: Myfortic 180mg  and Prograf 1mg     Other medication(s) to be shipped: none     David Ellis, DOB: 07/22/1945  Phone: [phone] (home)       All above HIPAA information was verified with patient.     Completed refill call assessment today to schedule patient's medication shipment from the [facility] [phone]).       Specialty medication(s) and dose(s) confirmed: Regimen is correct and unchanged.   Changes to medications: Mehtab reports no changes at this time.  Changes to insurance: No  Questions for the pharmacist: No    Confirmed patient received Welcome Packet with first shipment. The patient will receive a drug information handout for each medication shipped and additional FDA Medication Guides as required.       DISEASE/MEDICATION-SPECIFIC INFORMATION        N/A    SPECIALTY MEDICATION ADHERENCE     Medication Adherence    Patient reported X missed doses in the last month:  0  Adherence tools used:  patient uses a pill box to manage medications  Support network for adherence:  family member           Myfortic 180 mg: 10 days of medicine on hand   Prograf 1 mg: 10 days of medicine on hand         SHIPPING     Shipping address confirmed in Epic.     Delivery Scheduled: Yes, Expected medication delivery date: 12/19/18.     Medication will be delivered via UPS to the home address in Epic WAM.    [place] A Ottavio Norem   [facility] Specialty Technician

## 2018-12-18 MED FILL — PROGRAF 1 MG CAPSULE: 30 days supply | Qty: 180 | Fill #2 | Status: AC

## 2018-12-18 MED FILL — MYFORTIC 180 MG TABLET,DELAYED RELEASE: 30 days supply | Qty: 180 | Fill #0 | Status: AC

## 2018-12-18 MED FILL — PROGRAF 1 MG CAPSULE: 30 days supply | Qty: 180 | Fill #2

## 2018-12-18 NOTE — Unmapped (Signed)
Chart Notes from 11/18/18 Faxed to [facility] per their request.

## 2018-12-23 ENCOUNTER — Ambulatory Visit: Admit: 2018-12-23 | Discharge: 2018-12-24 | Payer: MEDICARE | Attending: Internal Medicine | Primary: Internal Medicine

## 2018-12-23 DIAGNOSIS — I4891 Unspecified atrial fibrillation: Secondary | ICD-10-CM

## 2018-12-23 DIAGNOSIS — I10 Essential (primary) hypertension: Principal | ICD-10-CM

## 2018-12-23 MED ORDER — METOPROLOL SUCCINATE ER 100 MG TABLET,EXTENDED RELEASE 24 HR
ORAL_TABLET | 3 refills | 0 days | Status: CP
Start: 2018-12-23 — End: ?

## 2018-12-23 MED ORDER — METOPROLOL SUCCINATE ER 50 MG TABLET,EXTENDED RELEASE 24 HR
ORAL_TABLET | 3 refills | 0 days | Status: CP
Start: 2018-12-23 — End: ?

## 2018-12-23 NOTE — Unmapped (Signed)
Assessment and Plan:   David Ellis is a 73 y.o. male with a history of atrial fibrillation, hypertension, atrial flutter status post ablation, and pulmonary embolism status post catheter directed lytics 05/2013 with subsequent RV dysfunction who presents in clinic today for follow-up.    1. Atrial fibrillation  Symptomatically doing well since last visit.  Converted back to sinus rhythm before cardioversion was completed.  No indication to pursue rhythm control at this time.  Of note, we had a long discussion regarding frequent lightheadedness and low blood pressures recently.  Plan to decrease metoprolol to 150 mg daily today.  He will also continue warfarin for anticoagulation.    2. Hypertension  Blood pressure overall running low recently.  Suspect this is related to recent weight loss.  We discussed weaning his blood pressure lowering regimen today.  To start with, will reduce metoprolol to 150 mg daily.  I would like to cut back on chlorthalidone moving forward but he does have chronic lower extremity edema and I worry this may worsen control of that.  We will continue to readdress.  I have asked him to send me blood pressure measurements and symptom log in 2 to 3 weeks to see if we need to refine his regimen further.  He will otherwise follow-up with Dr. Arvin Collard whose input we always appreciate as well.    3. Hyperlipidemia  Lipids well controlled, continue pravastatin.        I personally spent greater than 40 minutes with the patient and more than 50% of time was spent in counseling and coordination of care regarding the above diagnoses.      Vevelyn Francois, MD  [facility]  Pager [phone]      Subjective:   PCP: [name], MD  Patient: David Ellis  DOB: [birth year]    Reason for visit:  Afib, HTN, RV failure  HPI: David Ellis is a 73 y.o. male with a history of atrial fibrillation, hypertension, atrial flutter status post ablation, and pulmonary embolism status post catheter directed lytics 05/2013 with subsequent RV dysfunction who presents in clinic today for follow-up.  At his last visit, we had discussed setting him up for a TEE and cardioversion.  However, when he reported for the procedure, he had already converted to normal rhythm.  He reports doing well since then.  Has not noticed any palpitations or signs of atrial fibrillation.  He has started Trulicity and lost weight with the medication.  Over the last few months, he has also felt increasingly lightheaded.  This has been associated with lower blood pressures.  The lightheadedness occurs intermittently but typically occurs in the morning.  He notices this when getting up out of bed.  He did sustain a fall last week.  He felt lightheaded, braced himself against a table, and lost his balance on the hardwood floor.  He did not sustain any serious injuries.  He also reports feeling lightheaded after sneezing and coughing on occasion as well.  Patient otherwise denies chest pain, shortness of breath, fevers, nausea, or other complaints today.  I did review home blood pressure log which he sent in.  Most readings recently have been running 100s over 60s.  The ranges 87-1 40 over 40s to 60s.    ______________________________________________________________________  Pertinent Medical History, Cardiovascular History & Procedures:    Pertinent PMH:   ?? AFib  ?? Typical atrial flutter s/p ablation 06/2011  ?? DM  ?? 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 ESRD s/p transplant  ?? HTN  ?? OSA on CPAP  ?? Skin CA  ?? PE s/p catheter directed lytics 05/2013  ?? 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 Wedge defects in left lung without any thrombus visualized, could represent chronic changes or small subsegmental PEs.    Risk Factors: HTN, DM     Cath / PCI:   ?? None    CV Surgery:   ?? None    EP Procedures and Devices:   ?? None    Non-Invasive Evaluation(s):   ?? TTE 3/17: Normal EF, LVH, grade 1 diastolic dysfunction  ?? Ziopatch 2/16: Rare SVE and VE. 29 episodes of SVT, longest episode lasting 20 beats. No atrial fibrillation identified.  ?? TTE from 06/30/13: Normal EF 55-60%, mildly dilated RV, mild RV dysfunction.  ?? Nuclear stress test 07/2011:  ?? Normal myocardial perfusion study  ?? No evidence for significant ischemia or scar EF 62% and RV is mildly dilated.  ?? Minimal coronary calcification is noted involving the LAD and RCA    ______________________________________________________________________    Other past medical history, social history, family history, medications, allergies and problem list reviewed in the medical record.    Current cardiac medications include:   ?? Metoprolol 200 mg in the morning, changed to 150 mg daily in the morning today  ?? Chlorthalidone 25 mg daily  ?? Furosemide 40 mg once daily  ?? Irbesartan 150 mg daily  ?? Warfarin  ?? Pravastatin 20 mg daily      Objective:     BP 100/62  - Pulse 80  - Temp 37 ??C (98.6 ??F) Comment: wife 98 - Ht 182.9 cm (6')  - Wt (!) 116.1 kg (256 lb)  - SpO2 96%  - BMI 34.72 kg/m??     PHYSICAL EXAMINATION:   GENERAL: Alert, NAD  EYES: Sclerae clear, EOMI b/l  ENT: OP clear w/o exudate  NECK: Supple  CARDIOVASCULAR: Regular rate and rhythm, normal S1/S2, no murmurs, rubs, or gallops. 1+ b/l LE edema  RESPIRATORY: Clear to auscultation bilaterally. No wheezes, crackles, or rhonchi. Normal work of breathing.  ABDOMEN/GI: Soft, non-tender, non-distended with normoactive bowel sounds.  NEUROLOGIC: CN III-XII in tact, motor exam grossly non-focal.  SKIN: Small, scattered ecchymoses  PSYCH: Normal mental status, mood, and affect.  MSK: No effusions      ______________________________________________________________________  EKG 11/19 shows atrial fibrillation, normal axis, nonspecific ST/T changes    Recent CV pertinent labs:  Lab Results   Component Value Date    LDL Calculated 46 (L) 08/20/2018    LDL Calculated 74 04/21/2015    Non-HDL Cholesterol 107 08/20/2018 HDL 25 (L) 08/20/2018    HDL 36 (L) 04/21/2015    INR, POC 1.3 07/08/2015    INR, POC 2.80 12/10/2014    INR 2.30 12/04/2018    INR 3.5 (A) 11/05/2018    PRO-BNP 375 (H) 06/29/2014    Creatinine 1.70 (H) 12/04/2018    Creatinine 1.7 (H) 07/09/2017    Creatinine 1.36 08/09/2014    Potassium 5.0 12/04/2018    Potassium 4.6 07/09/2017    BUN 38 (H) 12/04/2018    BUN 49.00 (H) 07/09/2017    TSH 2.202 07/02/2017    TSH 1.20 07/02/2011       Chemistry        Component Value Date/Time    NA 135 12/04/2018 0908    NA 139 07/09/2017 1358    K  5.0 12/04/2018 0908    K 4.6 07/09/2017 1358    CL 99 12/04/2018 0908    CL 104 07/09/2017 1358    CO2 27.0 12/04/2018 0908    CO2 24.2 07/09/2017 1358    BUN 38 (H) 12/04/2018 0908    BUN 49.00 (H) 07/09/2017 1358    CREATININE 1.70 (H) 12/04/2018 0908    CREATININE 1.7 (H) 07/09/2017 1358    GLU 317 (H) 12/04/2018 0908        Component Value Date/Time    CALCIUM 9.3 12/04/2018 0908    CALCIUM 8.9 07/09/2017 1358    ALKPHOS 77 12/04/2018 0908    ALKPHOS 91 04/21/2015 0858    AST 27 12/04/2018 0908    AST 24 04/21/2015 0858    ALT 18 12/04/2018 0908    ALT 22 04/21/2015 0858    BILITOT 0.9 12/04/2018 0908    BILITOT 0.5 04/21/2015 1610

## 2019-01-07 ENCOUNTER — Ambulatory Visit: Admit: 2019-01-07 | Discharge: 2019-01-08 | Payer: MEDICARE

## 2019-01-07 DIAGNOSIS — E1169 Type 2 diabetes mellitus with other specified complication: Secondary | ICD-10-CM

## 2019-01-07 DIAGNOSIS — Z940 Kidney transplant status: Principal | ICD-10-CM

## 2019-01-07 LAB — LIPID PANEL
CHOLESTEROL/HDL RATIO SCREEN: 4.2 (ref <5.0)
CHOLESTEROL: 126 mg/dL (ref 100–199)
HDL CHOLESTEROL: 30 mg/dL — ABNORMAL LOW (ref 40–59)
LDL CHOLESTEROL CALCULATED: 48 mg/dL — ABNORMAL LOW (ref 60–99)
NON-HDL CHOLESTEROL: 96 mg/dL
TRIGLYCERIDES: 238 mg/dL — ABNORMAL HIGH (ref 1–149)
VLDL CHOLESTEROL CAL: 47.6 mg/dL — ABNORMAL HIGH (ref 12–42)

## 2019-01-07 LAB — COMPREHENSIVE METABOLIC PANEL
ALBUMIN: 3.9 g/dL (ref 3.5–5.0)
ALKALINE PHOSPHATASE: 75 U/L (ref 38–126)
ALT (SGPT): 18 U/L (ref <50)
ANION GAP: 11 mmol/L (ref 7–15)
AST (SGOT): 27 U/L (ref 19–55)
BLOOD UREA NITROGEN: 55 mg/dL — ABNORMAL HIGH (ref 7–21)
BUN / CREAT RATIO: 27
CALCIUM: 9.4 mg/dL (ref 8.5–10.2)
CO2: 29 mmol/L (ref 22.0–30.0)
CREATININE: 2.03 mg/dL — ABNORMAL HIGH (ref 0.70–1.30)
EGFR CKD-EPI AA MALE: 37 mL/min/{1.73_m2} — ABNORMAL LOW (ref >=60)
EGFR CKD-EPI NON-AA MALE: 32 mL/min/{1.73_m2} — ABNORMAL LOW (ref >=60)
POTASSIUM: 4.1 mmol/L (ref 3.5–5.0)
PROTEIN TOTAL: 7.1 g/dL (ref 6.5–8.3)
SODIUM: 137 mmol/L (ref 135–145)

## 2019-01-07 LAB — CBC W/ AUTO DIFF
BASOPHILS ABSOLUTE COUNT: 0 10*9/L (ref 0.0–0.1)
EOSINOPHILS ABSOLUTE COUNT: 0.1 10*9/L (ref 0.0–0.4)
EOSINOPHILS RELATIVE PERCENT: 1.3 %
HEMATOCRIT: 45.8 % (ref 41.0–53.0)
HEMOGLOBIN: 15 g/dL (ref 13.5–17.5)
LARGE UNSTAINED CELLS: 1 % (ref 0–4)
LYMPHOCYTES ABSOLUTE COUNT: 1.1 10*9/L — ABNORMAL LOW (ref 1.5–5.0)
LYMPHOCYTES RELATIVE PERCENT: 13.8 %
MEAN CORPUSCULAR HEMOGLOBIN CONC: 32.7 g/dL (ref 31.0–37.0)
MEAN CORPUSCULAR HEMOGLOBIN: 28.9 pg (ref 26.0–34.0)
MEAN CORPUSCULAR VOLUME: 88.3 fL (ref 80.0–100.0)
MONOCYTES ABSOLUTE COUNT: 0.5 10*9/L (ref 0.2–0.8)
MONOCYTES RELATIVE PERCENT: 6.3 %
NEUTROPHILS ABSOLUTE COUNT: 5.9 10*9/L (ref 2.0–7.5)
NEUTROPHILS RELATIVE PERCENT: 76.9 %
PLATELET COUNT: 248 10*9/L (ref 150–440)
RED BLOOD CELL COUNT: 5.19 10*12/L (ref 4.50–5.90)
RED CELL DISTRIBUTION WIDTH: 14.4 % (ref 12.0–15.0)
WBC ADJUSTED: 7.7 10*9/L (ref 4.5–11.0)

## 2019-01-07 LAB — BILIRUBIN TOTAL: Bilirubin:MCnc:Pt:Ser/Plas:Qn:: 0.7

## 2019-01-07 LAB — TACROLIMUS BLOOD: Lab: 8.2

## 2019-01-07 LAB — FASTING

## 2019-01-07 LAB — PROTIME: Lab: 20.4 — ABNORMAL HIGH

## 2019-01-07 LAB — PHOSPHORUS: Phosphate:MCnc:Pt:Ser/Plas:Qn:: 4.8 — ABNORMAL HIGH

## 2019-01-07 LAB — TACROLIMUS LEVEL, TIMED: TACROLIMUS BLOOD: 8.2 ng/mL

## 2019-01-07 LAB — PROTIME-INR
INR: 1.75
PROTIME: 20.4 s — ABNORMAL HIGH (ref 10.2–13.1)

## 2019-01-07 LAB — MEAN PLATELET VOLUME: Lab: 7.6

## 2019-01-07 LAB — ESTIMATED AVERAGE GLUCOSE: Estimated average glucose:MCnc:Pt:Bld:Qn:Estimated from glycated hemoglobin: 186

## 2019-01-09 NOTE — Unmapped (Signed)
-----   Message from Sharlee Blew, MD sent at 01/09/2019  9:31 AM EDT -----  Call and let patient know INR is slightly low.    What is current coumadin dosing?    Copies sent to renal and endorinology.    [name], M.D.     [facility] Internal Medicine at [facility]   [address]  [address]  [place], [place]  [address]  [phone]

## 2019-01-09 NOTE — Unmapped (Signed)
Patient notified of dosage change, anticoag tracker updated.

## 2019-01-09 NOTE — Unmapped (Signed)
Increase to 5 mg daily except MOn, 2.5    Repeat INR two weeks.    [name], M.D.     [facility] Internal Medicine at [facility]   [address]  [address]  [place], [place]  [address]  [phone]

## 2019-01-09 NOTE — Unmapped (Signed)
Patient states that he has not missed doses, has not increased greens, and takes 5 mg all days except Mondays and Thursdays he takes 2.5 mg.  Please advise on dosing.

## 2019-01-13 MED ORDER — TACROLIMUS 1 MG CAPSULE
ORAL_CAPSULE | Freq: Two times a day (BID) | ORAL | 2 refills | 30.00000 days | Status: CP
Start: 2019-01-13 — End: 2020-01-13
  Filled 2019-01-20: qty 180, 30d supply, fill #0

## 2019-01-13 MED ORDER — TACROLIMUS 1 MG CAPSULE: 3 mg | capsule | Freq: Two times a day (BID) | 2 refills | 30 days | Status: AC

## 2019-01-13 NOTE — Unmapped (Addendum)
Changed to Gen Prograf and pt will start after 01/21/19.  He knows to call clinic and set up lab appt.  Sent message to clinic    [facility] Clinical Assessment & Refill Coordination Note    David Ellis, DOB: 10/28/1945  Phone: [phone] (home)     All above HIPAA information was verified with patient.     Specialty Medication(s):   Transplant: Myfortic 180mg  and Prograf 1mg      Current Outpatient Medications   Medication Sig Dispense Refill   ??? aspirin (ECOTRIN) 81 MG tablet Take 81 mg by mouth continuous as needed. Only when flying and/or traveling by car more than 2 hours.     ??? BD INSULIN SYRINGE ULTRA-FINE 0.3 mL 31 gauge x 15/64 Syrg 1 each by Other route Three (3) times a day. 300 Syringe 3   ??? blood sugar diagnostic (FREESTYLE PRECISION NEO STRIPS) Strp Use to test blood sugar 4 times daily. Dx E11.65. 150 each 5   ??? blood-glucose meter kit Use as instructed. 1 each 0   ??? chlorthalidone (HYGROTON) 25 MG tablet TAKE ONE TABLET EVERY DAY 60 each 0   ??? cholecalciferol, vitamin D3, (VITAMIN D3) 2,000 unit Tab Take 2,000 Units by mouth daily.      ??? dulaglutide (TRULICITY) 0.75 mg/0.5 mL injection pen Inject 0.5 mL (0.75 mg total) under the skin once a week. 4 Syringe 11   ??? furosemide (LASIX) 40 MG tablet TAKE 1 TO 2 TABLETS ONCE A DAY 180 each 3   ??? gabapentin (NEURONTIN) 300 MG capsule TAKE 1 CAPSULE BY MOUTH TWICE DAILY 180 each 0   ??? HUMULIN R U-500, CONC, KWIKPEN 500 unit/mL (3 mL) CONCENTRATED injection DIAL TO MAX 100 UNITS BEFORE BREAKFAST, LUNCH, AND DINNER.  INJECT BEFOREMEALS MAX OF 300 UNITS DAILY 18 mL 11   ??? hydrocortisone 2.5 % cream   0   ??? irbesartan (AVAPRO) 150 MG tablet TAKE 1 TABLET AT BEDTIME 90 tablet 3   ??? ketoconazole (NIZORAL) 2 % cream   0   ??? magnesium chloride (SLOW_MAG) 64 mg TbEC Take 128 mg by mouth three (3) times a day (at 6am, noon and 6pm).      ??? metoprolol succinate (TOPROL-XL) 100 MG 24 hr tablet Combine with 50 mg tablet for total dose of 150 mg in the morning 90 tablet 3   ??? metoprolol succinate (TOPROL-XL) 50 MG 24 hr tablet Combine with 100 mg dose of metoprolol for total dose of 150 mg once daily 90 tablet 3   ??? multivitamin capsule Take 1 capsule by mouth daily.     ??? MYFORTIC 180 mg EC tablet Take 3 tablets (540 mg total) by mouth two (2) times a day. 180 tablet 35   ??? pen needle, diabetic (BD ULTRA-FINE NANO PEN NEEDLE) 32 gauge x 5/32 (4 mm) Ndle 1 each by Other route Three (3) times a day before meals. 300 each 3   ??? pravastatin (PRAVACHOL) 20 MG tablet TAKE ONE TABLET BY MOUTH EVERY DAY 90 each 3   ??? PROGRAF 1 mg capsule Take 3 capsules (3mg ) by mouth twice daily. 540 capsule 5   ??? warfarin (COUMADIN) 2.5 MG tablet TAKE ONE TABLET BY MOUTH EVERY MONDAY ORAS DIRECTED 30 each 12   ??? warfarin (COUMADIN) 5 MG tablet 5 days a week 30 each 12     No current facility-administered medications for this visit.      Facility-Administered Medications Ordered in  Other Visits   Medication Dose Route Frequency Provider Last Rate Last Dose   ??? bevacizumab    PRN (once a day) Melvyn Novas, MD   2.5 mg at 01/28/17 0750        Changes to medications: Marvion reports no changes at this time.    Allergies   Allergen Reactions   ??? Levofloxacin Other (See Comments)     Other reaction(s): Unknown  Unknown   ??? Lidocaine Other (See Comments)     I don't remember States is fine with Bupivicaine   ??? Penicillins      As child  Other reaction(s): UNKNOWN.   (UPDATE: Tolerated amp/sulbactam without side effects during 10/2014 hospitalization)   ??? Egg Derived Rash   ??? Enalapril Cough   ??? Epinephrine Palpitations   ??? Grass Pollen-[place], Standard Itching   ??? Lisinopril Rash   ??? Mepivacaine Hcl Palpitations       Changes to allergies: No    SPECIALTY MEDICATION ADHERENCE     Myfortic 180 mg: 13 days of medicine on hand   Prograf 1 mg: 13 days of medicine on hand        Medication Adherence    Adherence tools used: patient uses a pill box to manage medications  Support network for adherence: family member          Specialty medication(s) dose(s) confirmed: Regimen is correct and unchanged.     Are there any concerns with adherence? No    Adherence counseling provided? Not needed    CLINICAL MANAGEMENT AND INTERVENTION      Clinical Benefit Assessment:    Do you feel the medicine is effective or helping your condition? Yes    Clinical Benefit counseling provided? Not needed    Adverse Effects Assessment:    Are you experiencing any side effects? No    Are you experiencing difficulty administering your medicine? No    Quality of Life Assessment:    How many days over the past month did your transplant  keep you from your normal activities? For example, brushing your teeth or getting up in the morning. 0    Have you discussed this with your provider? Not needed    Therapy Appropriateness:    Is therapy appropriate? Yes, therapy is appropriate and should be continued    DISEASE/MEDICATION-SPECIFIC INFORMATION      N/A    PATIENT SPECIFIC NEEDS     ? Does the patient have any physical, cognitive, or cultural barriers? No    ? Is the patient high risk? No     ? Does the patient require a Care Management Plan? No     ? Does the patient require physician intervention or other additional services (i.e. nutrition, smoking cessation, social work)? No      SHIPPING     Specialty Medication(s) to be Shipped:   Transplant: Myfortic 180mg  and Prograf 1mg     Other medication(s) to be shipped: n/a     Changes to insurance: No    Delivery Scheduled: Yes, Expected medication delivery date: 01/21/19.     Medication will be delivered via UPS to the confirmed home address in [facility].    The patient will receive a drug information handout for each medication shipped and additional FDA Medication Guides as required.  Verified that patient has previously received a Conservation officer, historic buildings.    All of the patient's questions and concerns have been addressed.    Tylan Kinn Vangie Bicker   [facility]  [facility] Specialty Pharmacist

## 2019-01-14 MED ORDER — CHLORTHALIDONE 25 MG TABLET
Freq: Every day | ORAL | 0 refills | 120 days
Start: 2019-01-14 — End: ?

## 2019-01-14 NOTE — Unmapped (Signed)
Mr. Godlewski states understanding to reduce chlorthalidone to 1/2 tablet daily (total 12.5mg ). Will let us know if he gets increased swelling.  He will check BP/Pulse on home machine and send the 2-week log via MyChart.

## 2019-01-14 NOTE — Unmapped (Signed)
Christine,   Would you call pt and let him know that in response to his recent labwork, and in conference with Dr Dellis Filbert and Dr Radene Gunning, we'd like him to reduce his chlorthalidone to 12.5mg  (1/2 tablet) daily.   He should let us know if this reduction causes any increase in his swelling. Additionally, I'd like to see a BP log 2 weeks after dose reduction.   He can send that through My Chart or we could do a virtual/in person visit to follow up.     Send me back his response,   Maralyn Sago

## 2019-01-16 MED ORDER — GABAPENTIN 300 MG CAPSULE
Freq: Two times a day (BID) | ORAL | 3 refills | 90 days | Status: CP
Start: 2019-01-16 — End: 2020-01-16

## 2019-01-16 NOTE — Unmapped (Signed)
Hi Lauren,    Can you please take a look at these?     Thank you,  Aundra Millet

## 2019-01-20 MED FILL — MYFORTIC 180 MG TABLET,DELAYED RELEASE: ORAL | 30 days supply | Qty: 180 | Fill #1

## 2019-01-20 MED FILL — MYFORTIC 180 MG TABLET,DELAYED RELEASE: 30 days supply | Qty: 180 | Fill #1 | Status: AC

## 2019-01-20 MED FILL — TACROLIMUS 1 MG CAPSULE: 30 days supply | Qty: 180 | Fill #0 | Status: AC

## 2019-01-21 NOTE — Unmapped (Signed)
Please follow up with pt re: chlorthalidone dose reduction on 8/26 if he hasn't sent BP log through My Chart yet. Please find out if he has had any increased LE edema with dos reduction and get BPs the previous few days.

## 2019-01-22 ENCOUNTER — Ambulatory Visit: Admit: 2019-01-22 | Discharge: 2019-01-23 | Payer: MEDICARE

## 2019-01-22 ENCOUNTER — Other Ambulatory Visit: Admit: 2019-01-22 | Discharge: 2019-01-23 | Payer: MEDICARE

## 2019-01-22 DIAGNOSIS — I4891 Unspecified atrial fibrillation: Principal | ICD-10-CM

## 2019-01-22 LAB — COMPREHENSIVE METABOLIC PANEL
ALBUMIN: 3.8 g/dL (ref 3.5–5.0)
ALKALINE PHOSPHATASE: 84 U/L (ref 38–126)
ALT (SGPT): 19 U/L (ref <50)
ANION GAP: 13 mmol/L (ref 7–15)
AST (SGOT): 27 U/L (ref 19–55)
BILIRUBIN TOTAL: 0.6 mg/dL (ref 0.0–1.2)
BLOOD UREA NITROGEN: 57 mg/dL — ABNORMAL HIGH (ref 7–21)
CALCIUM: 9.2 mg/dL (ref 8.5–10.2)
CHLORIDE: 101 mmol/L (ref 98–107)
CO2: 26 mmol/L (ref 22.0–30.0)
CREATININE: 1.81 mg/dL — ABNORMAL HIGH (ref 0.70–1.30)
EGFR CKD-EPI AA MALE: 43 mL/min/{1.73_m2} — ABNORMAL LOW (ref >=60)
EGFR CKD-EPI NON-AA MALE: 37 mL/min/{1.73_m2} — ABNORMAL LOW (ref >=60)
GLUCOSE RANDOM: 175 mg/dL (ref 70–179)
POTASSIUM: 5.4 mmol/L — ABNORMAL HIGH (ref 3.5–5.0)
PROTEIN TOTAL: 6.8 g/dL (ref 6.5–8.3)
SODIUM: 140 mmol/L (ref 135–145)

## 2019-01-22 LAB — CO2: Carbon dioxide:SCnc:Pt:Ser/Plas:Qn:: 26

## 2019-01-22 NOTE — Unmapped (Signed)
Patient is here for coumadin clinic and complained of some dizziness and low blood pressure readings lately.  His bp read 106/64, pulse of 73, and 97% O2 today.  Body weight of 255.6 lb.     Per Dr. Dellis Filbert, patient is to hold chlorthalidone for now, will have labs today, will repeat INR in two weeks.  Patient will email bp readings from last 2 weeks later today.

## 2019-01-22 NOTE — Unmapped (Signed)
THank you.    As well, BP was noted to be low in the 110/80 today with occasional lightheadedness and SBP at home in the 80s.  I have asked him to hold the chlorthalidoine.  Check METB and CBC today (to check renal function and ensure no bleeding).    Hold chlorthalidione.  Await labs.    Let me know bp in a week.    [name], M.D.     [facility] Internal Medicine at [facility]   [address]  [address]  [place], [place]  [address]  [phone]

## 2019-01-22 NOTE — Unmapped (Signed)
Patient came in today for POC INR visit which read 3.5. Patient is currently taking 5 mg coumadin daily except for 2.5 mg on Mondays.. Denies any missed doses, diet changes, bleeding, or new medications.     Last INR was 1.75 on 01/09/19. At that time we increased to above dosing by 2.5 mg weekly..    Patient advised to decrease back to 5 mg daily, except for 2.5 mg on Mondays and Thursdays and to recheck in 2 weeks per Dr. Dellis Filbert.

## 2019-01-22 NOTE — Unmapped (Signed)
See other note

## 2019-01-23 NOTE — Unmapped (Signed)
Patient notified of these instructions and voiced understanding.  He will schedule with either cardiology or Dr. Dellis Filbert in two weeks.

## 2019-01-23 NOTE — Unmapped (Signed)
David Ellis states he spoke with Dr. Dellis Filbert yesterday - based on lab results, Dr. Dellis Filbert instructed him to Discontinue Chlorthalidone. He d/c'ed this medication effective today.    States he sent BP results in MyChart message (but I do not see them). Will continue to check BP/Pulse readings daily. States he does not have any LE edema.     He requested to follow up with [facility] cardiology - he has an appt with you Tuesday, 02/03/19 at 9:00 am.

## 2019-01-23 NOTE — Unmapped (Signed)
-----   Message from Sharlee Blew, MD sent at 01/22/2019  8:42 PM EDT -----  Call and let aptient know kidneys stable.  With the too low bp, and the lightheadedness, remain off of chlorthalidone for now.    Let me know if bp goes above 130/80.  Let me know if swelling.    Repeat BMP and INR in two weeks.      Please make sure he has an appointment with either Dierdre Harness or myself at that time.    [name], M.D.     [facility] Internal Medicine at [facility]   [address]  [address]  [place], [place]  [address]  [phone]

## 2019-01-27 ENCOUNTER — Ambulatory Visit: Admit: 2019-01-27 | Discharge: 2019-01-28 | Payer: MEDICARE

## 2019-01-27 DIAGNOSIS — E113593 Type 2 diabetes mellitus with proliferative diabetic retinopathy without macular edema, bilateral: Secondary | ICD-10-CM

## 2019-01-27 DIAGNOSIS — H4312 Vitreous hemorrhage, left eye: Principal | ICD-10-CM

## 2019-01-27 DIAGNOSIS — H35373 Puckering of macula, bilateral: Secondary | ICD-10-CM

## 2019-01-27 DIAGNOSIS — Z961 Presence of intraocular lens: Secondary | ICD-10-CM

## 2019-01-27 NOTE — Unmapped (Signed)
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  I reviewed the resident???s note.  I agree with the resident???s findings and plan including interpretation of images.     EXTENDED OPHTHALMOSCOPY RESULTS:  OD: MAs, PRP 360  OS: MAs, PRP 360

## 2019-02-02 ENCOUNTER — Other Ambulatory Visit: Payer: Self-pay

## 2019-02-02 ENCOUNTER — Emergency Department
Admission: EM | Admit: 2019-02-02 | Discharge: 2019-02-02 | Disposition: A | Discharge status: Home / Self Care | Payer: Medicare Other | Attending: Emergency Medicine | Admitting: Emergency Medicine

## 2019-02-02 ENCOUNTER — Emergency Department: Payer: Medicare Other

## 2019-02-02 DIAGNOSIS — R55 Syncope and collapse: Secondary | ICD-10-CM | POA: Insufficient documentation

## 2019-02-02 DIAGNOSIS — S0083XA Contusion of other part of head, initial encounter: Secondary | ICD-10-CM

## 2019-02-02 DIAGNOSIS — N184 Chronic kidney disease, stage 4 (severe): Secondary | ICD-10-CM | POA: Diagnosis not present

## 2019-02-02 DIAGNOSIS — Y92002 Bathroom of unspecified non-institutional (private) residence single-family (private) house as the place of occurrence of the external cause: Secondary | ICD-10-CM | POA: Diagnosis not present

## 2019-02-02 DIAGNOSIS — Z79899 Other long term (current) drug therapy: Secondary | ICD-10-CM | POA: Insufficient documentation

## 2019-02-02 DIAGNOSIS — I129 Hypertensive chronic kidney disease with stage 1 through stage 4 chronic kidney disease, or unspecified chronic kidney disease: Secondary | ICD-10-CM | POA: Diagnosis not present

## 2019-02-02 DIAGNOSIS — Y999 Unspecified external cause status: Secondary | ICD-10-CM | POA: Insufficient documentation

## 2019-02-02 DIAGNOSIS — W19XXXA Unspecified fall, initial encounter: Secondary | ICD-10-CM | POA: Diagnosis not present

## 2019-02-02 DIAGNOSIS — Z940 Kidney transplant status: Secondary | ICD-10-CM | POA: Diagnosis not present

## 2019-02-02 DIAGNOSIS — Y93E1 Activity, personal bathing and showering: Secondary | ICD-10-CM | POA: Insufficient documentation

## 2019-02-02 DIAGNOSIS — E860 Dehydration: Secondary | ICD-10-CM | POA: Diagnosis not present

## 2019-02-02 DIAGNOSIS — Z794 Long term (current) use of insulin: Secondary | ICD-10-CM | POA: Diagnosis not present

## 2019-02-02 DIAGNOSIS — E1122 Type 2 diabetes mellitus with diabetic chronic kidney disease: Secondary | ICD-10-CM | POA: Diagnosis not present

## 2019-02-02 LAB — URINALYSIS, COMPLETE (UACMP) WITH MICROSCOPIC
Bacteria, UA: NONE SEEN
Bilirubin Urine: NEGATIVE
Glucose, UA: NEGATIVE mg/dL
Hgb urine dipstick: NEGATIVE
Ketones, ur: NEGATIVE mg/dL
Leukocytes,Ua: NEGATIVE
Nitrite: NEGATIVE
Protein, ur: NEGATIVE mg/dL
Specific Gravity, Urine: 1.013 (ref 1.005–1.030)
pH: 5 (ref 5.0–8.0)

## 2019-02-02 LAB — PROTIME-INR
INR: 2 — ABNORMAL HIGH (ref 0.8–1.2)
Prothrombin Time: 22.1 seconds — ABNORMAL HIGH (ref 11.4–15.2)

## 2019-02-02 LAB — COMPREHENSIVE METABOLIC PANEL
ALT: 20 U/L (ref 0–44)
AST: 25 U/L (ref 15–41)
Albumin: 3.5 g/dL (ref 3.5–5.0)
Alkaline Phosphatase: 55 U/L (ref 38–126)
Anion gap: 10 (ref 5–15)
BUN: 51 mg/dL — ABNORMAL HIGH (ref 8–23)
CO2: 24 mmol/L (ref 22–32)
Calcium: 8.5 mg/dL — ABNORMAL LOW (ref 8.9–10.3)
Chloride: 105 mmol/L (ref 98–111)
Creatinine, Ser: 1.49 mg/dL — ABNORMAL HIGH (ref 0.61–1.24)
GFR calc Af Amer: 54 mL/min — ABNORMAL LOW (ref >60)
GFR calc non Af Amer: 47 mL/min — ABNORMAL LOW (ref >60)
Glucose, Bld: 112 mg/dL — ABNORMAL HIGH (ref 70–99)
Potassium: 3.8 mmol/L (ref 3.5–5.1)
Sodium: 139 mmol/L (ref 135–145)
Total Bilirubin: 0.4 mg/dL (ref 0.3–1.2)
Total Protein: 6.3 g/dL — ABNORMAL LOW (ref 6.5–8.1)

## 2019-02-02 LAB — TROPONIN I (HIGH SENSITIVITY)
Troponin I (High Sensitivity): 8 ng/L (ref <18)
Troponin I (High Sensitivity): 9 ng/L (ref <18)

## 2019-02-02 MED ORDER — SODIUM CHLORIDE 0.9 % IV BOLUS
500.0000 mL | Freq: Once | INTRAVENOUS | Status: AC
  Administered 2019-02-02: 500 mL via INTRAVENOUS

## 2019-02-02 NOTE — ED Notes (Signed)
Pt reports blowing his nose while in the shower and immediately after he found himself on the floor. Pt reports recent changes in daily medicines and issues with BP randomly dropping low throughout day. States sometimes will get as low as 70/50. Denies changes in vision, HA, dizziness. Reports neck pain. Wife at bedside. A&Ox4.

## 2019-02-02 NOTE — ED Notes (Signed)
Pt tolerated snack and drink well.

## 2019-02-02 NOTE — ED Notes (Signed)
Pt assisted to bedside toilet. Pt urinated.

## 2019-02-02 NOTE — ED Notes (Signed)
Pt leaving for imaging.

## 2019-02-02 NOTE — ED Notes (Signed)
Called lab again to inquire why Troponin and other labs not posted yet. Lab states they found bloodwork in fridge and are running it now. Will send next troponin soon.

## 2019-02-02 NOTE — Discharge Instructions (Addendum)
It appears that today you passed out due to being dehydrated from excessive diuretics.  Your CT scan of the head and lab tests were all okay.  Please continue your furosemide, but discontinue your chlorthalidone again until you see your doctor.  You should see your doctor this week in about 2 days for reassessment of your symptoms.

## 2019-02-02 NOTE — ED Notes (Signed)
Pt given food/drink per order.

## 2019-02-02 NOTE — ED Provider Notes (Signed)
[facility] Emergency Department Provider Note  ____________________________________________  Time seen: Approximately 3:11 PM  I have reviewed the triage vital signs and the nursing notes.   HISTORY  Chief Complaint Fall    HPI Larry Bright is a 73 y.o. male with a history of hypertension diabetes and severe CKD status post renal transplant 7 years ago who comes to the ED after an episode of syncope at home.  The patient was in his usual state of health, having some lightheadedness this morning that was worse while he was in the shower.  He then tried to blow his nose while in the shower and subsequently lost consciousness and fell.  His wife reports that for a few minutes he was speaking incoherently after which his speech returned to normal.  The patient does not have any recollection of events until EMS were there preparing to transport him to the ED.  Denies any prodromal symptoms other than lightheadedness, including chest pain shortness of breath back pain belly pain severe headache vision changes weakness or paresthesia.  Denies any of the symptoms currently.  He reports that dizzy spells have been a chronic recurrent issue for him but he is never had syncope.  He is on Lasix daily, has been off chlorthalidone as well for the last couple of weeks but just reinitiated at his doctor's instructions.      Past Medical History:  Diagnosis Date  . Arthritis   . Chronic kidney disease (CKD), stage IV (severe) ([place])   . Diabetes mellitus   . Hypertension   . Kidney transplant recipient   . Neuropathy of foot   . Sleep apnea    CPAP  . Stroke (cerebrum) [facility])      Patient Active Problem List   Diagnosis Date Noted  . Benign neoplasm of colon 03/09/2011  . Diverticulosis of colon (without mention of hemorrhage) 03/09/2011  . History of colonic polyps 03/09/2011     Past Surgical History:  Procedure Laterality Date  . CATARACT EXTRACTION   2009  . KIDNEY TRANSPLANT    . KNEE ARTHROSCOPY  2008   left  . TOE SURGERY     X 2  . [place]     Prior to Admission medications   Medication Sig Start Date End Date Taking? Authorizing Provider  aspirin 81 MG tablet Take 81 mg by mouth as needed.     [provider]  chlorthalidone (HYGROTON) 25 MG tablet daily at 2 PM. 06/05/16   [provider]  Cholecalciferol (VITAMIN D-1000 MAX ST) 1000 units tablet Take 2,000 Int'l Units by mouth daily at 2 PM.    [provider]  furosemide (LASIX) 40 MG tablet Take 40 mg by mouth daily at 2 PM.  01/18/11   [provider]  gabapentin (NEURONTIN) 300 MG capsule 2 (two) times daily. 04/09/16   [provider]  HUMULIN R U-500 KWIKPEN 500 UNIT/ML kwikpen Sliding scale 06/05/16   [provider]  magnesium chloride (SLOW-MAG) 64 MG TBEC SR tablet Take 2 tablets by mouth 3 (three) times daily.    [provider]  metoprolol (TOPROL-XL) 200 MG 24 hr tablet 2 (two) times daily. 04/20/16   [provider]  Multiple Vitamin (MULTIVITAMIN) tablet Take 1 tablet by mouth daily.    [provider]  mycophenolate (MYFORTIC) 180 MG EC tablet Take 180 mg by mouth as directed. Three   Twice daily    [provider]  ONE  TOUCH ULTRA TEST test strip  01/18/11   [provider]  Jonetta Speak LANCETS [place]  [birth year]   [provider]  pravastatin (PRAVACHOL) 20 MG tablet Take 20 mg by mouth daily at 2 PM. 11/07/15   [provider]  valsartan (DIOVAN) 80 MG tablet daily at 2 PM. 05/23/16   [provider]  warfarin (COUMADIN) 5 MG tablet Alternating with 2.5 and 5mg  qod 07/04/15 07/03/16  [provider]     Allergies Eggs or egg-derived products, Enalapril, Epinephrine, Mepivacaine hcl, and Penicillins   Family History  Problem Relation Age of Onset  . Diabetes Mother   . Kidney disease Father        was on dialysis  . Skin  cancer Father   . Alcoholism Brother   . Colon cancer Neg Hx   . Stomach cancer Neg Hx   . Rectal cancer Neg Hx   . Esophageal cancer Neg Hx   . Liver cancer Neg Hx     Social History Social History   Tobacco Use  . Smoking status: Never Smoker  . Smokeless tobacco: Never Used  Substance Use Topics  . Alcohol use: No  . Drug use: No    Review of Systems  Constitutional:   No fever or chills.  ENT:   No sore throat. No rhinorrhea. Cardiovascular:   No chest pain or syncope. Respiratory:   No dyspnea or cough. Gastrointestinal:   Negative for abdominal pain, vomiting and diarrhea.  Musculoskeletal:   Negative for focal pain or swelling All other systems reviewed and are negative except as documented above in ROS and HPI.  ____________________________________________   PHYSICAL EXAM:  VITAL SIGNS: ED Triage Vitals  Enc Vitals Group     BP 02/02/19 1245 136/64     Pulse Rate 02/02/19 1245 74     Resp 02/02/19 1247 18     Temp 02/02/19 1247 98.1 F (36.7 C)     Temp Source 02/02/19 1247 Oral     SpO2 02/02/19 1245 98 %     Weight 02/02/19 1247 253 lb (114.8 kg)     Height 02/02/19 1247 6\' 1"  (1.854 m)     Head Circumference --      Peak Flow --      Pain Score 02/02/19 1247 4     Pain Loc --      Pain Edu? --      Excl. in [place]? --     Vital signs reviewed, nursing assessments reviewed.   Constitutional:   Alert and oriented. Non-toxic appearance. Eyes:   Conjunctivae are normal. EOMI. PERRL. ENT      Head:   Normocephalic with a small abrasion over the right maxilla.  No bony point tenderness.      Nose:   No congestion/rhinnorhea.       Mouth/Throat:   MMM, no pharyngeal erythema. No peritonsillar mass.       Neck:   No meningismus. Full ROM.  With diffuse midline tenderness over the lower C-spine.  No step-offs or bony point tenderness. Hematological/Lymphatic/Immunilogical:   No cervical lymphadenopathy. Cardiovascular:   RRR. Symmetric bilateral radial  and DP pulses.  No murmurs. Cap refill less than 2 seconds. Respiratory:   Normal respiratory effort without tachypnea/retractions. Breath sounds are clear and equal bilaterally. No wheezes/rales/rhonchi. Gastrointestinal:   Soft and nontender. Non distended. There is no CVA tenderness.  No rebound, rigidity, or guarding.  Musculoskeletal:   There is tenderness over the right  lower ribs. Normal range of motion in all extremities. No joint effusions.  No lower extremity tenderness.  No edema. Neurologic:   Normal speech and language.  Motor grossly intact. No acute focal neurologic deficits are appreciated.  Skin:    Skin is warm, dry and intact. No rash noted.  No petechiae, purpura, or bullae.  ____________________________________________    LABS (pertinent positives/negatives) (all labs ordered are listed, but only abnormal results are displayed) Labs Reviewed  PROTIME-INR - Abnormal; Notable for the following components:      Result Value   Prothrombin Time 22.1 (*)    INR 2.0 (*)    All other components within normal limits  URINALYSIS, COMPLETE (UACMP) WITH MICROSCOPIC - Abnormal; Notable for the following components:   Color, Urine YELLOW (*)    APPearance CLEAR (*)    All other components within normal limits  COMPREHENSIVE METABOLIC PANEL - Abnormal; Notable for the following components:   Glucose, Bld 112 (*)    BUN 51 (*)    Creatinine, Ser 1.49 (*)    Calcium 8.5 (*)    Total Protein 6.3 (*)    GFR calc non Af Amer 47 (*)    GFR calc Af Amer 54 (*)    All other components within normal limits  URINE CULTURE  TACROLIMUS LEVEL  CBG MONITORING, ED  TROPONIN I (HIGH SENSITIVITY)  TROPONIN I (HIGH SENSITIVITY)   ____________________________________________   EKG  Interpreted by me Sinus rhythm rate of 74, left axis, normal intervals.  Poor R wave progression.  Normal ST segments and T waves.  ____________________________________________    RADIOLOGY  Dg  Chest 2 View  Result Date: 02/02/2019 CLINICAL DATA:  RIGHT lower chest pain post fall, passed out and fell in the shower 30 minutes prior to presentation, mid upper back pain EXAM: CHEST - 2 VIEW COMPARISON:  05/22/2013 FINDINGS: Upper normal heart size. Mediastinal contours and pulmonary vascularity normal. Atherosclerotic calcification aorta. Lungs clear. No pulmonary infiltrate, pleural effusion, or pneumothorax. Bones demineralized without acute abnormality. IMPRESSION: No acute abnormalities. Electronically Signed   By: Lavonia Dana M.D.   On: 02/02/2019 14:50   Ct Head Wo Contrast  Result Date: 02/02/2019 CLINICAL DATA:  73 year old male with acute injury following fall today with headache and neck pain. Initial encounter. EXAM: CT HEAD WITHOUT CONTRAST CT CERVICAL SPINE WITHOUT CONTRAST TECHNIQUE: Multidetector CT imaging of the head and cervical spine was performed following the standard protocol without intravenous contrast. Multiplanar CT image reconstructions of the cervical spine were also generated. COMPARISON:  05/22/2013 head CT FINDINGS: CT HEAD FINDINGS Brain: No evidence of acute infarction, hemorrhage, hydrocephalus, extra-axial collection or mass lesion/mass effect. Atrophy, mild chronic small-vessel white matter ischemic changes and remote LEFT basal ganglia infarct again noted. Vascular: Carotid and vertebral atherosclerotic calcifications again noted. Skull: Normal. Negative for fracture or focal lesion. Sinuses/Orbits: No acute finding. Other: None. CT CERVICAL SPINE FINDINGS Alignment: Normal. Skull base and vertebrae: No acute fracture. No primary bone lesion or focal pathologic process. Soft tissues and spinal canal: No prevertebral fluid or swelling. No visible canal hematoma. Disc levels: Mild multilevel degenerative disc disease and facet arthropathy noted. Bulky anterior osteophytes from C3-C7 noted. Upper chest: No acute abnormality. Other: None IMPRESSION: 1. No evidence of  acute intracranial abnormality. Atrophy, chronic small-vessel white matter ischemic changes and small remote LEFT basal ganglia infarct. 2. No static evidence of acute injury to the cervical spine. Degenerative changes as described. Electronically Signed   By: Dellis Filbert  Hu M.D.   On: 02/02/2019 15:19   Ct Cervical Spine Wo Contrast  Result Date: 02/02/2019 CLINICAL DATA:  73 year old male with acute injury following fall today with headache and neck pain. Initial encounter. EXAM: CT HEAD WITHOUT CONTRAST CT CERVICAL SPINE WITHOUT CONTRAST TECHNIQUE: Multidetector CT imaging of the head and cervical spine was performed following the standard protocol without intravenous contrast. Multiplanar CT image reconstructions of the cervical spine were also generated. COMPARISON:  05/22/2013 head CT FINDINGS: CT HEAD FINDINGS Brain: No evidence of acute infarction, hemorrhage, hydrocephalus, extra-axial collection or mass lesion/mass effect. Atrophy, mild chronic small-vessel white matter ischemic changes and remote LEFT basal ganglia infarct again noted. Vascular: Carotid and vertebral atherosclerotic calcifications again noted. Skull: Normal. Negative for fracture or focal lesion. Sinuses/Orbits: No acute finding. Other: None. CT CERVICAL SPINE FINDINGS Alignment: Normal. Skull base and vertebrae: No acute fracture. No primary bone lesion or focal pathologic process. Soft tissues and spinal canal: No prevertebral fluid or swelling. No visible canal hematoma. Disc levels: Mild multilevel degenerative disc disease and facet arthropathy noted. Bulky anterior osteophytes from C3-C7 noted. Upper chest: No acute abnormality. Other: None IMPRESSION: 1. No evidence of acute intracranial abnormality. Atrophy, chronic small-vessel white matter ischemic changes and small remote LEFT basal ganglia infarct. 2. No static evidence of acute injury to the cervical spine. Degenerative changes as described. Electronically Signed   By:  Margarette Canada M.D.   On: 02/02/2019 15:19    ____________________________________________   PROCEDURES Procedures  ____________________________________________  DIFFERENTIAL DIAGNOSIS   Intracranial hemorrhage, cervical spine fracture, rib fracture, pneumothorax, hemothorax, dehydration, electrolyte abnormality  CLINICAL IMPRESSION / ASSESSMENT AND PLAN / ED COURSE  Medications ordered in the ED: Medications  sodium chloride 0.9 % bolus 500 mL (500 mLs Intravenous New Bag/Given 02/02/19 1920)    Pertinent labs & imaging results that were available during my care of the patient were reviewed by me and considered in my medical decision making (see chart for details).  Larry Bright was evaluated in Emergency Department on 02/02/2019 for the symptoms described in the history of present illness. He was evaluated in the context of the global COVID-19 pandemic, which necessitated consideration that the patient might be at risk for infection with the SARS-CoV-2 virus that causes COVID-19. Institutional protocols and algorithms that pertain to the evaluation of patients at risk for COVID-19 are in a state of rapid change based on information released by regulatory bodies including the CDC and federal and state organizations. These policies and algorithms were followed during the patient's care in the ED.   Patient on warfarin presents after syncope at home.  Most likely due to dehydration and diuretic effect, will need initial trauma work-up with CT head and cervical spine as well as chest x-ray, and metabolic work-up with labs.  Vital signs currently unremarkable, exam is reassuring and nonfocal neurologically.  Clinical Course as of Feb 02 2016  Mon Feb 02, 2019  1510 Chest x-ray unremarkable  DG Chest 2 View [PS]  1723 Labs all unremarkable, creatinine improved compared to baseline creatinine about 1.8.  Will check orthostatics and ambulation trial if stable can discharge home   Creatinine(!): 1.49 [PS]    Clinical Course User Index [PS] Carrie Mew, MD    ----------------------------------------- 8:17 PM on 02/02/2019 -----------------------------------------  Work-up all reassuring.  Patient did have positive orthostatics with standing, so is being given a IV fluid bolus for rehydration.  After that he will be stable for discharge home to follow-up with  primary care.  I have advised him to discontinue the chlorthalidone since this seems to have over diuresed him.  Doubt DVT, fracture or other serious traumatic injury, infection or sepsis   ____________________________________________   FINAL CLINICAL IMPRESSION(S) / ED DIAGNOSES    Final diagnoses:  Syncope, unspecified syncope type  Dehydration  Contusion of face, initial encounter     ED Discharge Orders    None      Portions of this note were generated with dragon dictation software. Dictation errors may occur despite best attempts at proofreading.   Carrie Mew, MD 02/02/19 2018

## 2019-02-02 NOTE — ED Notes (Signed)
Per Janett Billow RN basic bloodwork tubes sent down to lab on arrival. Will call lab to confirm nothing else needs collecting.

## 2019-02-02 NOTE — ED Notes (Signed)
Lab confirms they have a blue/lav/grn. They would like another blue/lav in case of send-out. Will send these.

## 2019-02-02 NOTE — ED Notes (Signed)
Pt requesting food/drink. Educated must wait until xray/CT results back and EDP Joni Fears gives okay.

## 2019-02-02 NOTE — ED Triage Notes (Signed)
Pt arrives via [facility] from home for an unwitnessed fall about 30 mins ago. PT reports he was getting out of the shower and doesn't remember anything else, pt reports pain in the mid upper back. PT unsure if he hit his head. Pt reports he does take warfarin. PT reports BP has been lower since starting chlorthalidone. PT A&Ox4 and in NAD.

## 2019-02-02 NOTE — ED Notes (Signed)
Pt given scrub pants as he accidentally urinated on personal shorts. Shorts placed into belongings bag and given to pt.

## 2019-02-03 ENCOUNTER — Institutional Professional Consult (permissible substitution): Admit: 2019-02-03 | Discharge: 2019-02-04 | Payer: MEDICARE

## 2019-02-03 DIAGNOSIS — R609 Edema, unspecified: Secondary | ICD-10-CM

## 2019-02-03 DIAGNOSIS — I10 Essential (primary) hypertension: Secondary | ICD-10-CM

## 2019-02-03 DIAGNOSIS — I4891 Unspecified atrial fibrillation: Secondary | ICD-10-CM

## 2019-02-03 DIAGNOSIS — R55 Syncope and collapse: Secondary | ICD-10-CM

## 2019-02-03 LAB — URINE CULTURE: Culture: <10000 — AB

## 2019-02-03 NOTE — Unmapped (Signed)
Medications, Allergies, History, and Preferred pharmacy was reviewed with the patient, and corrections were made to their chart accordingly.   Travel Screening completed and patient does not require referral to Covid-19 helpline.   Reason for visit is:  Follow Up.     The Patient was able to check their vital signs, weight, and height at home. They are as follows:    BP:  128/67  Pulse: 81  Temp: 98.6  Weight: 253#  Height: 72  Pain Score:0  Location: no chest pain  Phone number: [phone]

## 2019-02-03 NOTE — Unmapped (Addendum)
Assessment and Plan:   David Ellis is a 73 y.o. male with a history of atrial fibrillation, hypertension, atrial flutter status post ablation, and pulmonary embolism status post catheter directed lytics 05/2013 with subsequent RV dysfunction who presents via phone call today for follow-up.      1. syncope, hypotension   Unfortunately, pt had syncopal episode yesterday which occurred in setting of having taken 25mg  chlorthalidone that AM and with vagal type maneuver to clear his nose. Workup in the ED was unremarkable aside from orthostatic hypotension and stable BUN/Cr compared with 10 days earlier. Pt has been dealing with hypotension in the last couple of months, thought 2/2 weight loss,  resulting in reductions in his medications, first with metoprolol and recently with discontinuation of chlorthalidone. Despite this, his BP continues to run on the low side, sometimes in the 80s systolic   As we discussed, there are significant limitations with telehealth visits, especially in this case. While syncope was most likely due to hypotension given recent history,  I cannot rule out other etiology such as arrhythmia. I recommend 14 day holter monitor and pt agrees to come in for in office visit early next week, by which time his soreness should have improved.   We discussed further changes in his medication regimen to reduce risk of hypotension and recurrent syncope/fall. After discussion among his team including Drs Dellis Filbert, Radene Gunning and Zenaida Deed, we agreed to continue off chlorthalidone, d/c irbesartan and change furosemide to prn use to allow increased BP while maintaining metoprolol at current dose in case tachyarrhythmia could be contributing to lightheadedness/syncope. Alternatively I do have concerns that he he may have induced transient bradycardia with his nose blowing valsalva type maneuver.   Will place holter later this week in clinic which is his preference to receiving it by mail. He will continue to monitor BP and will let me know if SBPs are consistently > 150 systolic.       2. Atrial fibrillation  He is unaware of any recurrence though as we discussed, it is possible lightheadedness/syncope are due to AF. Plan for holter as above.   Continue metoprolol at current dose for now.  He will also continue warfarin for anticoagulation though would certainly need to readdress this if syncope recurs.     3. Hypertension  Blood pressure overall running low recently.  Suspect this is related to recent weight loss. See plan as above.     4. Hyperlipidemia  Lipids well controlled, continue pravastatin.      I spent 37 minutes on the phone with the patient. I spent an additional 15 minutes on pre- and post-visit activities.     The patient was physically located in [place] or a state in which I am permitted to provide care. The patient and/or parent/guardian understood that s/he may incur co-pays and cost sharing, and agreed to the telemedicine visit. The visit was reasonable and appropriate under the circumstances given the patient's presentation at the time.    The patient and/or parent/guardian has been advised of the potential risks and limitations of this mode of treatment (including, but not limited to, the absence of in-person examination) and has agreed to be treated using telemedicine. The patient's/patient's family's questions regarding telemedicine have been answered.     If the visit was completed in an ambulatory setting, the patient and/or parent/guardian has also been advised to contact their provider???s office for worsening conditions, and seek emergency medical treatment and/or call 911 if the patient  deems either necessary.      Subjective:   PCP: David Addison, MD  Patient: David Ellis  DOB: 28-May-1946    Reason for visit:  Afib, HTN, RV failure  HPI: David Ellis is a 73 y.o. male with a history of atrial fibrillation, hypertension, atrial flutter status post ablation, and pulmonary embolism status post catheter directed lytics 05/2013 with subsequent RV dysfunction who presents via phone call today for follow-up.          Contact Information    Person Contacted: Patient  Contact Phone number: [phone] (home)  [phone]  Phone Outcome: Contacted Patient/Caregiver  Is there someone else in the room? Yes. What is your relationship? wife. Do you want this person here for the visit? yes.    Please see pre-visit phone call completed by CMA [facility] documenting recent vitals, medication reconcilliation, allergy, history and Covid screening, which I personally reviewed.     Since last visit, in consultation with Dr Radene Gunning and Dr Dellis Filbert, chlorthalidone was decreased then ultimately discontinued due to hypotension   BP log for the last 2 weeks, off chlorthalidone, shows range 80-126/47-70.   He notes yesterday AM he took 25mg  chlorthalidone as his legs felt tight as they had in the past with edema, though there was no visible edema or weight gain.  Unfortunately, 2 hours later in the shower, after clearing his nose, he became lightheaded and then syncopized. He was taken to [facility] where he reports workup included unremarkable EKG,  head CT, CXR, troponin x 2, BMP. Review chart shows stable BUN/Cr yesterday compared with 8/20 check. He was told he was dehydrated and states orthostatic BPs supported this. He was given IVF and discharged home. He was too sore to keep in person appt with me today so it was changed to virtual visit.   He denies any preceeding chest pain or palpitations. He has not been aware of any AF in over a year. He has compression stockings but his legs haven't been edematous in a long time so he typically only wears them with flying.   He is drinking 90 oz fluids most days of the week including water, diet caff free sodas. He has lost over 22 # since starting trulicity for his DM.     Of note he states he always sits before sneezing because that causes lightheadedness.   He denies any PND, orthopnea.    ______________________________________________________________________  Pertinent Medical History, Cardiovascular History & Procedures:    Pertinent PMH:   ?? AFib  ?? Typical atrial flutter s/p ablation 06/2011  ?? DM  ?? 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 ESRD s/p transplant  ?? HTN  ?? OSA on CPAP  ?? Skin CA  ?? PE s/p catheter directed lytics 05/2013  ?? 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 Wedge defects in left lung without any thrombus visualized, could represent chronic changes or small subsegmental PEs.    Risk Factors: HTN, DM     Cath / PCI:   ?? None    CV Surgery:   ?? None    EP Procedures and Devices:   ?? None    Non-Invasive Evaluation(s):   ?? TTE 3/17: Normal EF, LVH, grade 1 diastolic dysfunction  ?? Ziopatch 2/16: Rare SVE and VE. 29 episodes of SVT, longest episode lasting 20 beats. No atrial fibrillation identified.  ?? TTE from 06/30/13: Normal EF 55-60%, mildly dilated RV, mild RV dysfunction.  ??  Nuclear stress test 07/2011:  ?? Normal myocardial perfusion study  ?? No evidence for significant ischemia or scar EF 62% and RV is mildly dilated.  ?? Minimal coronary calcification is noted involving the LAD and RCA    ______________________________________________________________________    Other past medical history, social history, family history, medications, allergies and problem list reviewed in the medical record.    Current cardiac medications include:   ?? Metoprolol 150 mg daily in the morning   ?? Chlorthalidone 25 mg yesterday but previous 2 weeks 12.5mg  qd - d/c today   ?? Furosemide 40 mg once daily  ?? Irbesartan 150 mg daily--> d/c today   ?? Warfarin  ?? Pravastatin 20 mg daily      Objective:     There were no vitals taken for this visit.     Wt Readings from Last 3 Encounters:   01/22/19 (!) 115.9 kg (255 lb 9.6 oz)   12/23/18 (!) 116.1 kg (256 lb)   11/18/18 (!) 116.3 kg (256 lb 8 oz)       As part of this TelehealthVisit, no in-person exam was conducted.  Telephone interaction permitted the following observations.    PHYSICAL EXAMINATION:   GENERAL:  Alert, NAD, conversant  RESPIRATORY:  Able to speak in complete sentences.  No audible wheezes or cough. Normal work of breathing.  NEUROLOGIC: hearing intact to phone conversation, normal articulation   PSYCH:  Normal mental status, mood, and verbal affect.    ______________________________________________________________________  EKG 11/19 shows atrial fibrillation, normal axis, nonspecific ST/T changes    Recent CV pertinent labs:  Lab Results   Component Value Date    LDL Calculated 48 (L) 01/07/2019    LDL Calculated 74 04/21/2015    Non-HDL Cholesterol 96 01/07/2019    HDL 30 (L) 01/07/2019    HDL 36 (L) 04/21/2015    INR, POC 1.3 07/08/2015    INR, POC 2.80 12/10/2014    INR 3.5 (A) 01/22/2019    PRO-BNP 375 (H) 06/29/2014    Creatinine 1.81 (H) 01/22/2019    Creatinine 1.7 (H) 07/09/2017    Creatinine 1.36 08/09/2014    Potassium 5.4 (H) 01/22/2019    Potassium 4.6 07/09/2017    BUN 57 (H) 01/22/2019    BUN 49.00 (H) 07/09/2017    TSH 2.202 07/02/2017    TSH 1.20 07/02/2011       Chemistry        Component Value Date/Time    NA 140 01/22/2019 0842    NA 139 07/09/2017 1358    K 5.4 (H) 01/22/2019 0842    K 4.6 07/09/2017 1358    CL 101 01/22/2019 0842    CL 104 07/09/2017 1358    CO2 26.0 01/22/2019 0842    CO2 24.2 07/09/2017 1358    BUN 57 (H) 01/22/2019 0842    BUN 49.00 (H) 07/09/2017 1358    CREATININE 1.81 (H) 01/22/2019 0842    CREATININE 1.7 (H) 07/09/2017 1358    GLU 175 01/22/2019 0842        Component Value Date/Time    CALCIUM 9.2 01/22/2019 0842    CALCIUM 8.9 07/09/2017 1358    ALKPHOS 84 01/22/2019 0842    ALKPHOS 91 04/21/2015 0858    AST 27 01/22/2019 0842    AST 24 04/21/2015 0858    ALT 19 01/22/2019 0842    ALT 22 04/21/2015 0858    BILITOT 0.6 01/22/2019 0842    BILITOT 0.5 04/21/2015 9604

## 2019-02-04 LAB — TACROLIMUS LEVEL: Tacrolimus (FK506) - LabCorp: 8.5 ng/mL (ref 2.0–20.0)

## 2019-02-04 NOTE — Unmapped (Signed)
Addended by: Dierdre Harness on: 02/03/2019 10:54 PM     Modules accepted: Orders

## 2019-02-04 NOTE — Unmapped (Signed)
I spoke with David Ellis yesterday about his fall and low blood pressure BP/dizzy spells. Messaged Drs. Min and Kuritzky and we agreed he would take metoprolol 100mg  BID and stop the chlorthalidone and irbesartan. Today David Ellis said his BP was quite high 190s/80s but was asymptomatic. He read me his BPs over the last few days and they tend to swing widely from 100-190/50-80. Told him to continue the lasix 40mg  QD (normally on BID) and to let me know if his BP remains above 150 on a consistent basis. He has been set up for a biopatch to see if his going into intermittent AFib which could cause his low BPs.

## 2019-02-05 ENCOUNTER — Institutional Professional Consult (permissible substitution): Admit: 2019-02-05 | Discharge: 2019-02-06 | Payer: MEDICARE

## 2019-02-05 DIAGNOSIS — R55 Syncope and collapse: Secondary | ICD-10-CM

## 2019-02-05 NOTE — Unmapped (Signed)
Zio Patch monitor explained; consent signed; instructions given, including to call [employer] for any difficulties; questions answered; skin prepped; monitor placed; pt initiated; registered with [employer].    #[id number]____UJ advised to call [employer] for problems @ [phone].

## 2019-02-09 NOTE — Unmapped (Signed)
This encounter was created in error - please disregard. Pt was unable to have video visit so telephone visit performed instead. Please see Tollie Pizza PA-C note from 02/03/19 for documentaiton of visit.

## 2019-02-10 NOTE — Unmapped (Signed)
He was seen on Thursday by Maralyn Sago. He made an appointment for tomorrow, but his wife was wondering if he still needs to be seen. His wife thinks he doesn't need it. Please advise.

## 2019-02-10 NOTE — Unmapped (Signed)
Telemedicine appointment 02/03/2019.  Please advise.

## 2019-02-11 NOTE — Unmapped (Signed)
If they would like to reschedule with JJK for after the holter returns, I'm ok with that.   ( Add 1-2 weeks after holter placement)     Please find out recent BPs.

## 2019-02-12 NOTE — Unmapped (Signed)
[facility] Clinical Assessment & Refill Coordination Note    David Ellis, DOB: Dec 25, 1945  Phone: [phone] (home)     All above HIPAA information was verified with patient.     Specialty Medication(s):   Transplant: Myfortic 180mg  and tacrolimus 1mg      [facility] Pharmacist Intervention    Type of intervention: change in mfg of NTI drug    Medication: tacrolimus    Problem: last fill was sandoz mfg which is unavailable    Intervention: pt aware of mfg switch to accord mfg, need for labwork, and to contact clinic on date of switch. I will message clinic as well    Follow up needed: see abov    Approximate time spent: 10 minutes    David Ellis   [facility] Specialty Pharmacist        Current Outpatient Medications   Medication Sig Dispense Refill   ??? aspirin (ECOTRIN) 81 MG tablet Take 81 mg by mouth continuous as needed. Only when flying and/or traveling by car more than 2 hours.     ??? BD INSULIN SYRINGE ULTRA-FINE 0.3 mL 31 gauge x 15/64 Syrg 1 each by Other route Three (3) times a day. 300 Syringe 3   ??? blood sugar diagnostic (FREESTYLE PRECISION NEO STRIPS) Strp Use to test blood sugar 4 times daily. Dx E11.65. 150 each 5   ??? blood-glucose meter kit Use as instructed. 1 each 0   ??? chlorthalidone (HYGROTON) 25 MG tablet Take 0.5 tablets (12.5 mg total) by mouth daily. 60 each 0   ??? cholecalciferol, vitamin D3, (VITAMIN D3) 2,000 unit Tab Take 2,000 Units by mouth daily.      ??? dulaglutide (TRULICITY) 0.75 mg/0.5 mL injection pen Inject 0.5 mL (0.75 mg total) under the skin once a week. 4 Syringe 11   ??? furosemide (LASIX) 40 MG tablet TAKE 1 TO 2 TABLETS ONCE A DAY 180 each 3   ??? gabapentin (NEURONTIN) 300 MG capsule Take 1 capsule (300 mg total) by mouth Two (2) times a day. 180 each 3   ??? HUMULIN R U-500, CONC, KWIKPEN 500 unit/mL (3 mL) CONCENTRATED injection DIAL TO MAX 100 UNITS BEFORE BREAKFAST, LUNCH, AND DINNER.  INJECT BEFOREMEALS MAX OF 300 UNITS DAILY 18 mL 11   ??? hydrocortisone 2.5 % cream   0   ??? irbesartan (AVAPRO) 150 MG tablet TAKE 1 TABLET AT BEDTIME 90 tablet 3   ??? ketoconazole (NIZORAL) 2 % cream   0   ??? magnesium chloride (SLOW_MAG) 64 mg TbEC Take 128 mg by mouth three (3) times a day (at 6am, noon and 6pm).      ??? metoprolol succinate (TOPROL-XL) 100 MG 24 hr tablet Combine with 50 mg tablet for total dose of 150 mg in the morning 90 tablet 3   ??? metoprolol succinate (TOPROL-XL) 50 MG 24 hr tablet Combine with 100 mg dose of metoprolol for total dose of 150 mg once daily 90 tablet 3   ??? multivitamin capsule Take 1 capsule by mouth daily.     ??? MYFORTIC 180 mg EC tablet Take 3 tablets (540 mg total) by mouth two (2) times a day. 180 tablet 35   ??? pen needle, diabetic (BD ULTRA-FINE NANO PEN NEEDLE) 32 gauge x 5/32 (4 mm) Ndle 1 each by Other route Three (3) times a day before meals. 300 each 3   ??? pravastatin (PRAVACHOL) 20  MG tablet TAKE ONE TABLET BY MOUTH EVERY DAY 90 each 3   ??? tacrolimus (PROGRAF) 1 MG capsule Take 3 capsules (3 mg total) by mouth two (2) times a day. 180 capsule 2   ??? warfarin (COUMADIN) 2.5 MG tablet TAKE ONE TABLET BY MOUTH EVERY MONDAY ORAS DIRECTED 30 each 12   ??? warfarin (COUMADIN) 5 MG tablet 5 days a week 30 each 12     No current facility-administered medications for this visit.      Facility-Administered Medications Ordered in Other Visits   Medication Dose Route Frequency Provider Last Rate Last Dose   ??? bevacizumab    PRN (once a day) Melvyn Novas, MD   2.5 mg at 01/28/17 0750        Changes to medications: [name] reports no changes at this time.    Allergies   Allergen Reactions   ??? Levofloxacin Other (See Comments)     Other reaction(s): Unknown  Unknown   ??? Lidocaine Other (See Comments)     I don't remember States is fine with Bupivicaine   ??? Penicillins      As child  Other reaction(s): UNKNOWN.   (UPDATE: Tolerated amp/sulbactam without side effects during 10/2014 hospitalization)   ??? Egg Derived Rash   ??? Enalapril Cough   ??? Epinephrine Palpitations   ??? Grass Pollen-[place], Standard Itching   ??? Lisinopril Rash   ??? Mepivacaine Hcl Palpitations       Changes to allergies: No    SPECIALTY MEDICATION ADHERENCE     Myfortic 180mg   : 12 days of medicine on hand   Tacrolimus 1mg   : 12 days of medicine on hand       Medication Adherence    Patient reported X missed doses in the last month: 0  Specialty Medication: myfortic 180mg   Patient is on additional specialty medications: Yes  Additional Specialty Medications: Tacrolimus 1mg   Patient Reported Additional Medication X Missed Doses in the Last Month: 0  Adherence tools used: patient uses a pill box to manage medications  Support network for adherence: family member          Specialty medication(s) dose(s) confirmed: Regimen is correct and unchanged.     Are there any concerns with adherence? No    Adherence counseling provided? Not needed    CLINICAL MANAGEMENT AND INTERVENTION      Clinical Benefit Assessment:    Do you feel the medicine is effective or helping your condition? Yes    Clinical Benefit counseling provided? Not needed    Adverse Effects Assessment:    Are you experiencing any side effects? No    Are you experiencing difficulty administering your medicine? No    Quality of Life Assessment:    How many days over the past month did your transplant  keep you from your normal activities? For example, brushing your teeth or getting up in the morning. 0    Have you discussed this with your provider? Not needed    Therapy Appropriateness:    Is therapy appropriate? Yes, therapy is appropriate and should be continued    DISEASE/MEDICATION-SPECIFIC INFORMATION      N/A    PATIENT SPECIFIC NEEDS     ? Does the patient have any physical, cognitive, or cultural barriers? No    ? Is the patient high risk? Yes, patient taking a REMS drug     ? Does the patient require a Care Management Plan? No     ?  Does the patient require physician intervention or other additional services (i.e. nutrition, smoking cessation, social work)? No      SHIPPING     Specialty Medication(s) to be Shipped:   Transplant: Myfortic 180mg  and tacrolimus 1mg     Other medication(s) to be shipped: n/a     Changes to insurance: No    Delivery Scheduled: Yes, Expected medication delivery date: 02/19/2019.     Medication will be delivered via UPS to the confirmed home address in [facility] [place] [facility].    The patient will receive a drug information handout for each medication shipped and additional FDA Medication Guides as required.  Verified that patient has previously received a Conservation officer, historic buildings.    All of the patient's questions and concerns have been addressed.    David Ellis   [facility] Specialty Pharmacist

## 2019-02-12 NOTE — Unmapped (Signed)
Called pt to reassure him the switch of Tac from brand to generic is ok with Dr Margaretmary Bayley and transplant pharmacy team. He appreciated the call and support.

## 2019-02-12 NOTE — Unmapped (Signed)
Mr. Renteria and his wife will be here in October to give the ZioMonitor enough time to get mailed in. The appt is on Jack's schedule - his wife needs to accompany him INSIDE THE BUILDING and DURING THE VISIT.

## 2019-02-12 NOTE — Unmapped (Signed)
Patient requesting Primary T[facility] to call him regarding medication change. Primary TNC made aware.

## 2019-02-18 MED ORDER — PRAVASTATIN 20 MG TABLET
3 refills | 0 days | Status: CP
Start: 2019-02-18 — End: ?

## 2019-02-18 MED ORDER — TRULICITY 1.5 MG/0.5 ML SUBCUTANEOUS PEN INJECTOR
0 refills | 0 days | Status: CP
Start: 2019-02-18 — End: ?

## 2019-02-18 MED FILL — TACROLIMUS 1 MG CAPSULE, IMMEDIATE-RELEASE: ORAL | 30 days supply | Qty: 180 | Fill #1

## 2019-02-18 MED FILL — TACROLIMUS 1 MG CAPSULE: 30 days supply | Qty: 180 | Fill #1 | Status: AC

## 2019-02-18 MED FILL — MYFORTIC 180 MG TABLET,DELAYED RELEASE: ORAL | 30 days supply | Qty: 180 | Fill #2

## 2019-02-18 MED FILL — MYFORTIC 180 MG TABLET,DELAYED RELEASE: 30 days supply | Qty: 180 | Fill #2 | Status: AC

## 2019-02-24 ENCOUNTER — Ambulatory Visit: Admit: 2019-02-24 | Discharge: 2019-02-25 | Payer: MEDICARE

## 2019-02-24 DIAGNOSIS — Z940 Kidney transplant status: Secondary | ICD-10-CM

## 2019-02-24 DIAGNOSIS — Z6833 Body mass index (BMI) 33.0-33.9, adult: Secondary | ICD-10-CM

## 2019-02-24 DIAGNOSIS — N183 Chronic kidney disease, stage 3 (moderate): Secondary | ICD-10-CM

## 2019-02-24 DIAGNOSIS — Z79899 Other long term (current) drug therapy: Secondary | ICD-10-CM

## 2019-02-24 NOTE — Unmapped (Signed)
PCP:  [name], MD      ASSESSMENT/PLAN: [name] is a 73 y.o. 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    1. S/P LURD kidney transplant 2013 with baseline creatinine ~1.6-2.0, though in looking back, it was 1.3-1.7 prior to October 2019. His tac trough has been within goal of 6-10. I did message his transplant nephrologist, Dr. 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 His electrolytes are WNL. Urine dipstick negative for proteinuria and most recent UACR (03/2018) was within normal range. Urine cytology from October 2019 was negative for decoy cells. Need to repeat but unable to produce urine today so will have him do this at next visit.    3. HTN -- now relative hypotension with recent fall. We stopped his chlorthalidone and irbesartan a couple of weeks ago and he's now consistently above systolic. No further dizziness/lightheadedness. Awaiting ziopatch results.    4. T2DM. HbA1c has been ~8% since being on the trulicity which is probably okay given his other comorbidities. I do worry about the fairly frequent hypoglycemic episodes and will let Dr. Tiburcio Pea know. History of proliferative retinopathy s/p PPV/endolaser treatments, last evaluated by ophthalmology in 01/2019.    5. ASHD/CVA history. Continue pravastatin and ASA.     6. A-Fib. Continue coumadin. INR f/b Dr. Dellis Filbert.    7. HM: Had his flu shot and UTD on pneumonia and shinrix. Was due for colonoscopy but reportedly GI is leery of stopping anticoagulation so are following cologuard test results. No FH colon CA. Continue biannual follow-up with dermatology re: recurrent basal cell CA.     9. High falls risk: No recent falls. Has walker and cane but still very unstable with this and not getting out of the house because of this. Will ask Dr. Dellis Filbert whether wheelchair for outings would be indicated.    Mr.[name] will return to see me in 3 months.  ______________________________________________________________________________________________________    HPI: [name] is a 73 y.o. man s/p LURD kidney transplant (from his wife, David Ellis) 08/21/2011 and B/L creatinine ~1.4-1.7 who returns for f/u. 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     He has had multiple complications since his transplant including a large, provoked (long car ride) pulmonary embolus requiring thrombolysis (Dec 2014), osteomyelitis of his left foot requiring 4th/5th toe amputation (2016), recurrent basal cell CA, CVA s/p thombolysis (07/2015). He has significant PAD and neuropathy and has no sensation on either leg below his knees. He also has severe retinopathy and his vision is quite poor so no longer drives.     Interval history: Had a significant fall in the shower a few weeks ago and had been having very low BPs (<100 systolic). Discussed with PCP and cardiology and agreed to stop his chlorthalidone and irbesartan as he has a h/o of intermittent AFib with RVR so needs the metoprolol. They placed a ziopatch  to see if this is recurring and contributing to his intermittent hypotension/fall. BPs at home now ranging 100-120/60-70. Sugars are generally controlled but has had hypoglycemia to <60 every couple of weeks. His wife David Ellis feels frustrated because he never wants to leave the house and stays on the couch all day. He feels frustrated by his limited mobility which put a damper on a vacation with friends because they couldn't do the things they normal would do. Denies depressed mood, hopelessness, SI/HI. David Ellis feels he's his usual self at home.     ROS: As per HPI. The remainder of the 10 system review is negative.    PAST MEDICAL HISTORY:  Past Medical History:   Diagnosis Date   ??? Atrial flutter (CMS-HCC)    ??? Diabetes mellitus (CMS-HCC)    ??? Diabetic nephropathy (CMS-HCC)    ??? Diabetic retinopathy (CMS-HCC)    ??? Fractures    ??? Ganglion cyst    ??? Hand injury    ??? Heart disease    ??? Hyperlipidemia    ??? Hypertension    ??? Joint pain    ??? Osteomyelitis (CMS-HCC) June 2016   ??? Pulmonary embolism (CMS-HCC) Dec 2015   ??? 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 Stroke (CMS-HCC)    ??? Tear of meniscus of knee    ??? Transplanted kidney 08/21/2011       ALLERGIES  Levofloxacin; Lidocaine; Penicillins; Egg derived; Enalapril; Epinephrine; Grass pollen-bermuda, standard; Lisinopril; and Mepivacaine hcl    MEDICATIONS:  Current Outpatient Medications   Medication Sig Dispense Refill   ??? aspirin (ECOTRIN) 81 MG tablet Take 81 mg by mouth continuous as needed. Only when flying and/or traveling by car more than 2 hours.     ??? BD INSULIN SYRINGE ULTRA-FINE 0.3 mL 31 gauge x 15/64 Syrg 1 each by Other route Three (3) times a day. 300 Syringe 3   ??? blood sugar diagnostic (FREESTYLE PRECISION NEO STRIPS) Strp Use to test blood sugar 4 times daily. Dx E11.65. 150 each 5   ??? blood-glucose meter kit Use as instructed. 1 each 0   ??? cholecalciferol, vitamin D3, (VITAMIN D3) 2,000 unit Tab Take 2,000 Units by mouth daily.      ??? dulaglutide (TRULICITY) 0.75 mg/0.5 mL injection pen Inject 0.5 mL (0.75 mg total) under the skin once a week. 4 Syringe 11   ??? furosemide (LASIX) 40 MG tablet TAKE 1 TO 2 TABLETS ONCE A DAY 180 each 3   ??? gabapentin (NEURONTIN) 300 MG capsule Take 1 capsule (300 mg total) by mouth Two (2) times a day. 180 each 3   ??? HUMULIN R U-500, CONC, KWIKPEN 500 unit/mL (3 mL) CONCENTRATED injection DIAL TO MAX 100 UNITS BEFORE BREAKFAST, LUNCH, AND DINNER.  INJECT BEFOREMEALS MAX OF 300 UNITS DAILY 18 mL 11   ??? hydrocortisone 2.5 % cream   0   ??? ketoconazole (NIZORAL) 2 % cream   0   ??? magnesium chloride (SLOW_MAG) 64 mg TbEC Take 128 mg by mouth three (3) times a day (at 6am, noon and 6pm).      ??? metoprolol succinate (TOPROL-XL) 100 MG 24 hr tablet Combine with 50 mg tablet for total dose of 150 mg in the morning 90 tablet 3   ??? metoprolol succinate (TOPROL-XL) 50 MG 24 hr tablet Combine with 100 mg dose of metoprolol for total dose of 150 mg once daily 90  tablet 3   ??? multivitamin capsule Take 1 capsule by mouth daily.     ??? MYFORTIC 180 mg EC tablet Take 3 tablets (540 mg total) by mouth two (2) times a day. 180 tablet 35   ??? pen needle, diabetic (BD ULTRA-FINE NANO PEN NEEDLE) 32 gauge x 5/32 (4 mm) Ndle 1 each by Other route Three (3) times a day before meals. 300 each 3   ??? pravastatin (PRAVACHOL) 20 MG tablet TAKE 1 TABLET BY MOUTH DAILY 90 each 3   ??? tacrolimus (PROGRAF) 1 MG capsule Take 3 capsules (3 mg total) by mouth two (2) times a day. 180 capsule 2   ??? TRULICITY 1.5 mg/0.5 mL PnIj INJECT 1.5MG  SUBCUTANEOUSLY ONCE A WEEK 2 mL 0   ??? warfarin (COUMADIN) 2.5 MG tablet TAKE ONE TABLET BY MOUTH EVERY MONDAY ORAS DIRECTED 30 each 12   ??? warfarin (COUMADIN) 5 MG tablet 5 days a week 30 each 12     No current facility-administered medications for this visit.      Facility-Administered Medications Ordered in Other Visits   Medication Dose Route Frequency Provider Last Rate Last Dose   ??? bevacizumab    PRN (once a day) Melvyn Novas, MD   2.5 mg at 01/28/17 0750     Physical exam: 102/60 HR 68 T 36.66F  HEENT: AI OP clear MMM  Neck: supple, No JVD  Cor: S1S2 RRR  Pulm: CTAB  Abd: Soft, NT  Ext: LLE no edema; RLE 1+ edema (chronic)  Neuro: alert and nonfocal; able to rise from seated position with assistance from cane MEDICAL DECISION MAKING    Results for orders placed or performed in visit on 01/22/19   Comprehensive Metabolic Panel   Result Value Ref Range    Sodium 140 135 - 145 mmol/L    Potassium 5.4 (H) 3.5 - 5.0 mmol/L    Chloride 101 98 - 107 mmol/L    Anion Gap 13 7 - 15 mmol/L    CO2 26.0 22.0 - 30.0 mmol/L    BUN 57 (H) 7 - 21 mg/dL    Creatinine 1.61 (H) 0.70 - 1.30 mg/dL    BUN/Creatinine Ratio 31     EGFR CKD-EPI Non-African American, Male 37 (L) >=60 mL/min/1.62m2    EGFR CKD-EPI African American, Male 43 (L) >=60 mL/min/1.39m2    Glucose 175 70 - 179 mg/dL    Calcium 9.2 8.5 - 09.6 mg/dL    Albumin 3.8 3.5 - 5.0 g/dL    Total Protein 6.8 6.5 - 8.3 g/dL    Total Bilirubin 0.6 0.0 - 1.2 mg/dL    AST 27 19 - 55 U/L    ALT 19 <50 U/L    Alkaline Phosphatase 84 38 - 126 U/L     *Note: Due to a large number of results and/or encounters for the requested time period, some results have not been displayed. A complete set of results can be found in Results Review.        Lab Results   Component Value Date    NA 140 01/22/2019    K 5.4 (H) 01/22/2019    CL 101 01/22/2019    CO2 26.0 01/22/2019    BUN 57 (H) 01/22/2019    CREATININE 1.81 (H) 01/22/2019    GFRAA 51 (L) 10/29/2017    GFRNONAA 42 (L) 10/29/2017    ALBUMIN 3.8 01/22/2019     Lab Results   Component Value Date    WBC 7.7  01/07/2019    HGB 15.0 01/07/2019    HCT 45.8 01/07/2019    PLT 248 01/07/2019     Lab Results   Component Value Date    PTH 192.3 (H) 11/27/2017    CALCIUM 9.2 01/22/2019    PHOS 4.8 (H) 01/07/2019     Scribe's Attestation: Trey Sailors, MD obtained and performed the history, physical exam and medical decision making elements that were entered into the chart.  Signed by Lonia Chimera, Scribe, on February 24, 2019 at 7:54 AM.    Attending Statement: Documentation assistance provided by the Scribe. I was present during the time the encounter was recorded. The information recorded by the Scribe was done at my direction and has been reviewed and validated by me.

## 2019-02-26 MED ORDER — MISCELLANEOUS MEDICAL SUPPLY MISC
Freq: Every day | 0 refills | 0 days | Status: CP | PRN
Start: 2019-02-26 — End: ?

## 2019-02-26 NOTE — Unmapped (Signed)
Please let patient know that Dr. Arvin Collard conveyed to [place] his need for a lightweight wheelchair.  I [facility] where he typically [facility] his things.    If they cannot help, please ask him to find a local medical supply store and we can sent it therer.    [name], M.D.     [facility]   [address]  [address]  [place], [place]  [address]  [phone]

## 2019-02-26 NOTE — Unmapped (Signed)
-----   Message from Amy Vinnie Langton, MD sent at 02/26/2019  9:18 AM EDT -----  Regarding: wheelchair?  Hi David Ellis-  I saw [name] Tuesday. He's not getting out and about b/c his mobility is so limited. Any chance he could get a wheelchair for outings?  Thanks!  Amy

## 2019-02-26 NOTE — Unmapped (Signed)
I have spoken with patient and he will let us know if [facility] cannot fill the rx.

## 2019-03-02 DIAGNOSIS — Z0000 Encounter for general adult medical examination without abnormal findings: Secondary | ICD-10-CM

## 2019-03-02 DIAGNOSIS — Z125 Encounter for screening for malignant neoplasm of prostate: Secondary | ICD-10-CM

## 2019-03-02 NOTE — Unmapped (Signed)
Patient would like a PSA added to his future labs please.  Thank you

## 2019-03-10 ENCOUNTER — Ambulatory Visit: Admit: 2019-03-10 | Discharge: 2019-03-11 | Payer: MEDICARE | Attending: Internal Medicine | Primary: Internal Medicine

## 2019-03-10 DIAGNOSIS — R609 Edema, unspecified: Secondary | ICD-10-CM

## 2019-03-10 DIAGNOSIS — Z0000 Encounter for general adult medical examination without abnormal findings: Secondary | ICD-10-CM

## 2019-03-10 DIAGNOSIS — I4891 Unspecified atrial fibrillation: Secondary | ICD-10-CM

## 2019-03-10 DIAGNOSIS — I10 Essential (primary) hypertension: Secondary | ICD-10-CM

## 2019-03-10 DIAGNOSIS — Z940 Kidney transplant status: Secondary | ICD-10-CM

## 2019-03-10 DIAGNOSIS — E785 Hyperlipidemia, unspecified: Secondary | ICD-10-CM

## 2019-03-10 LAB — COMPREHENSIVE METABOLIC PANEL
ALBUMIN/GLOBULIN RATIO: 0.8 — ABNORMAL LOW (ref 1.1–2.2)
ALBUMIN: 3.4 g/dL — ABNORMAL LOW (ref 3.5–5.0)
ALKALINE PHOSPHATASE: 128 U/L (ref 50–136)
ANION GAP: 13 mmol/L
AST (SGOT): 26 U/L (ref 7–31)
BILIRUBIN TOTAL: 0.5 mg/dL (ref 0.0–1.5)
BLOOD UREA NITROGEN: 24 mg/dL (ref 5–26)
BUN / CREAT RATIO: 15 (ref 12–25)
CALCIUM: 9 mg/dL (ref 8.5–10.5)
CHLORIDE: 103 mmol/L (ref 95–110)
CO2: 20.6 mmol/L — ABNORMAL LOW (ref 21.0–31.0)
CREATININE: 1.57 mg/dL — ABNORMAL HIGH (ref 0.50–1.50)
EGFR CKD-EPI AA MALE: 51 mL/min/{1.73_m2} — ABNORMAL LOW (ref >=60)
EGFR CKD-EPI NON-AA MALE: 44 mL/min/{1.73_m2} — ABNORMAL LOW (ref >=60)
GLOBULIN, TOTAL: 4.2 g/dL (ref 1.5–4.3)
GLUCOSE RANDOM: 195 mg/dL — ABNORMAL HIGH (ref 60–100)
POTASSIUM: 4.8 mmol/L (ref 3.5–5.1)
PROTEIN TOTAL: 7.6 g/dL (ref 6.4–8.3)
SODIUM: 137 mmol/L (ref 136–145)

## 2019-03-10 LAB — CBC W/ AUTO DIFF
HEMATOCRIT: 48.8 % (ref 37.0–50.0)
HEMOGLOBIN: 15.7 g/dL (ref 12.2–17.1)
LYMPHOCYTES ABSOLUTE COUNT: 1.2 10*9/L (ref 1.0–4.8)
MEAN CORPUSCULAR HEMOGLOBIN CONC: 32.2 g/dL (ref 31.0–36.0)
MEAN CORPUSCULAR HEMOGLOBIN: 28.6 pg (ref 26.0–32.0)
MEAN CORPUSCULAR VOLUME: 89 fL (ref 89.0–97.0)
MEAN PLATELET VOLUME: 6.1 fL — ABNORMAL LOW (ref 6.5–8.9)
MONOCYTES ABSOLUTE COUNT: 0.4 10*9/L (ref 0.0–0.8)
MONOCYTES RELATIVE PERCENT: 5 %
NEUTROPHILS ABSOLUTE COUNT: 6.6 10*9/L (ref 1.8–7.0)
NEUTROPHILS RELATIVE PERCENT: 80.2 %
RED BLOOD CELL COUNT: 5.5 10*12/L (ref 4.20–5.80)
WBC ADJUSTED: 8.2 10*9/L (ref 3.8–10.8)

## 2019-03-10 LAB — PROSTATE SPECIFIC ANTIGEN: Prostate specific Ag:MCnc:Pt:Ser/Plas:Qn:: 1.09

## 2019-03-10 LAB — GLOBULIN, TOTAL: Chemistry studies:Cmplx:-:^Patient:Set:: 4.2

## 2019-03-10 LAB — PHOSPHORUS
PHOSPHORUS: 3.5 mg/dL (ref 2.9–4.7)
Phosphate:MCnc:Pt:Ser/Plas:Qn:: 3.5

## 2019-03-10 LAB — PROTIME: Coagulation tissue factor induced:Time:Pt:PPP:Qn:Coag: 28.4 — ABNORMAL HIGH

## 2019-03-10 LAB — NEUTROPHILS ABSOLUTE COUNT: Neutrophils:NCnc:Pt:Bld:Qn:Automated count: 6.6

## 2019-03-10 MED ORDER — METOPROLOL SUCCINATE ER 100 MG TABLET,EXTENDED RELEASE 24 HR
ORAL_TABLET | Freq: Two times a day (BID) | ORAL | 3 refills | 90 days | Status: CP
Start: 2019-03-10 — End: 2020-03-09

## 2019-03-11 DIAGNOSIS — Z940 Kidney transplant status: Secondary | ICD-10-CM

## 2019-03-11 LAB — TACROLIMUS BLOOD: Lab: 6.8

## 2019-03-11 MED ORDER — TACROLIMUS 1 MG CAPSULE
ORAL_CAPSULE | Freq: Two times a day (BID) | ORAL | 2 refills | 30 days | Status: CP
Start: 2019-03-11 — End: 2019-03-12

## 2019-03-11 NOTE — Unmapped (Signed)
[facility] Refill Coordination Note    Specialty Medication(s) to be Shipped:   Transplant: Myfortic 180mg  and tacrolimus 1mg     Other medication(s) to be shipped: N/A     David Ellis, DOB: 1946-02-09  Phone: [phone] (home)       All above HIPAA information was verified with patient.     Completed refill call assessment today to schedule patient's medication shipment from the [facility] [phone]).       Specialty medication(s) and dose(s) confirmed: Patient reports changes to the regimen as follows: Tacrolimus 1mg - 3 in the morning and 2 in the evening  **Sent request for new rx if appropriate**  Changes to medications: David Ellis reports no changes at this time.  Changes to insurance: No  Questions for the pharmacist: No    Confirmed patient received Welcome Packet with first shipment. The patient will receive a drug information handout for each medication shipped and additional FDA Medication Guides as required.       DISEASE/MEDICATION-SPECIFIC INFORMATION        N/A    SPECIALTY MEDICATION ADHERENCE     Medication Adherence    Patient reported X missed doses in the last month: 1  Specialty Medication: Myfortic 180mg   Patient is on additional specialty medications: Yes  Additional Specialty Medications: Tacrolimus 1mg   Patient Reported Additional Medication X Missed Doses in the Last Month: 1  Patient is on more than two specialty medications: No  Adherence tools used: patient uses a pill box to manage medications  Support network for adherence: family member          Myfortic 180 mg: 14 days of medicine on hand   Tacrolimus 1 mg: 14 days of medicine on hand     SHIPPING     Shipping address confirmed in Epic.     Delivery Scheduled: Yes, Expected medication delivery date: 03/20/2019.     Medication will be delivered via UPS to the home address in Epic [place].    David Ellis   [facility] Specialty Technician

## 2019-03-11 NOTE — Unmapped (Addendum)
Assessment and Plan:   David Ellis is a 73 y.o. 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    1. Atrial fibrillation  Symptomatically doing well since last visit.  Reviewed recent ziopatch without recurrent atrial fibrillation.  Continue metoprolol at current dose in addition to warfarin for anticoagulation.    2. Hypertension  Blood pressure appears to have stabilized.  Suspect recent issues with relative hypotension were related to recent weight loss.  Plan to continue current therapy.    3. Hyperlipidemia  Lipids overall well controlled though triglycerides are somewhat elevated.  I am hopeful this will improve with better control of diabetes as well as weight loss.  Otherwise, continue pravastatin.     4.  Edema  Stable at this time off chlorthalidone.  Plan to continue furosemide at current dose.        Vevelyn Francois, MD  [facility]  Pager [phone]      Subjective:   PCP: [name], MD  Patient: [name]  DOB: 1946/04/22    Reason for visit:  Afib, HTN, RV failure  HPI: David Ellis is a 73 y.o. 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  Since his last visit, he reports doing much better.  He is come off his irbesartan and chlorthalidone.  Blood pressures have since stabilized.  He has not had a significant lightheadedness or syncope.  He otherwise denies chest discomfort, shortness of breath, palpitations, fevers, nausea, or other complaints today    ______________________________________________________________________  Pertinent Medical History, Cardiovascular History & Procedures:    Pertinent PMH:   ?? AFib  ?? Typical atrial flutter s/p ablation 06/2011  ?? DM  ?? 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 ESRD s/p transplant  ?? HTN  ?? OSA on CPAP  ?? Skin CA  ?? PE s/p catheter directed lytics 05/2013  ?? 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 Wedge defects in left lung without any thrombus visualized, could represent chronic changes or small subsegmental PEs.    Risk Factors: HTN, DM     Cath / PCI:   ?? None    CV Surgery:   ?? None    EP Procedures and Devices:   ?? None    Non-Invasive Evaluation(s):   ?? Ziopatch 9/20: Predominantly sinus rhythm with rates 57 to 100 bpm, average heart rate 75 bpm.  71 episodes of SVT, longest lasting 17 beats at 91 bpm.  Rare PVCs, brief idioventricular rhythm noted.  No wide-complex tachycardia 2.5% PVC burden.  ?? TTE 3/17: Normal EF, LVH, grade 1 diastolic dysfunction  ?? Ziopatch 2/16: Rare SVE and VE. 29 episodes of SVT, longest episode lasting 20 beats. No atrial fibrillation identified.  ?? TTE from 06/30/13: Normal EF 55-60%, mildly dilated RV, mild RV dysfunction.  ?? Nuclear stress test 07/2011:  ?? Normal myocardial perfusion study  ?? No evidence for significant ischemia or scar EF 62% and RV is mildly dilated.  ?? Minimal coronary calcification is noted involving the LAD and RCA    ______________________________________________________________________    Other past medical history, social history, family history, medications, allergies  and problem list reviewed in the medical record.    Current cardiac medications include:   ?? Metoprolol 100 mg twice daily  ?? ASA 81 mg daily  ?? Furosemide 40 mg once daily  ?? Warfarin  ?? Pravastatin 20 mg daily      Objective:     BP 112/74  - Pulse 88  - Temp 36.9 ??C (98.4 ??F) (Oral)  - Resp 20  - Ht 185.4 cm (6' 1)  - Wt (!) 116.6 kg (257 lb)  - SpO2 97%  - BMI 33.91 kg/m??     PHYSICAL EXAMINATION:   GENERAL: Alert, NAD  EYES: Sclerae clear, EOMI b/l  ENT: Wearing a mask  NECK: Supple  CARDIOVASCULAR: Regular rate and rhythm, normal S1/S2, no murmurs, rubs, or gallops. 1+ b/l LE edema  RESPIRATORY: Clear to auscultation bilaterally. No wheezes, crackles, or rhonchi. Normal work of breathing.  ABDOMEN/GI: Soft, non-tender, non-distended with normoactive bowel sounds.  NEUROLOGIC: CN III-XII in tact, motor exam grossly non-focal.  SKIN: Small, scattered ecchymoses  PSYCH: Normal mental status, mood, and affect.  MSK: No effusions      ______________________________________________________________________  EKG 11/19 shows atrial fibrillation, normal axis, nonspecific ST/T changes    Recent CV pertinent labs:  Lab Results   Component Value Date    LDL Calculated 48 (L) 01/07/2019    LDL Calculated 74 04/21/2015    Non-HDL Cholesterol 96 01/07/2019    HDL 30 (L) 01/07/2019    HDL 36 (L) 04/21/2015    INR, POC 1.3 07/08/2015    INR, POC 2.80 12/10/2014    INR 2.43 03/10/2019    INR 3.5 (A) 01/22/2019    PRO-BNP 375 (H) 06/29/2014    Creatinine 1.57 (H) 03/10/2019    Creatinine 1.7 (H) 07/09/2017    Creatinine 1.36 08/09/2014    Potassium 4.8 03/10/2019    Potassium 4.6 07/09/2017    BUN 24 03/10/2019    BUN 49.00 (H) 07/09/2017    TSH 2.202 07/02/2017    TSH 1.20 07/02/2011       Chemistry        Component Value Date/Time    NA 137 03/10/2019 0907    NA 139 07/09/2017 1358    K 4.8 03/10/2019 0907    K 4.6 07/09/2017 1358    CL 103 03/10/2019 0907    CL 104 07/09/2017 1358    CO2 20.6 (L) 03/10/2019 0907    CO2 24.2 07/09/2017 1358    BUN 24 03/10/2019 0907    BUN 49.00 (H) 07/09/2017 1358    CREATININE 1.57 (H) 03/10/2019 0907    CREATININE 1.7 (H) 07/09/2017 1358    GLU 195 (H) 03/10/2019 0907        Component Value Date/Time    CALCIUM 9.0 03/10/2019 0907    CALCIUM 8.9 07/09/2017 1358    ALKPHOS 128 03/10/2019 0907    ALKPHOS 91 04/21/2015 0858    AST 26 03/10/2019 0907    AST 24 04/21/2015 0858    ALT 24 03/10/2019 0907    ALT 22 04/21/2015 0858    BILITOT 0.5 03/10/2019 0907    BILITOT 0.5 04/21/2015 1610

## 2019-03-12 DIAGNOSIS — Z940 Kidney transplant status: Secondary | ICD-10-CM

## 2019-03-12 MED ORDER — TACROLIMUS 1 MG CAPSULE
ORAL_CAPSULE | 3 refills | 0 days | Status: CP
Start: 2019-03-12 — End: ?
  Filled 2019-03-19: qty 150, 30d supply, fill #0

## 2019-03-12 NOTE — Unmapped (Signed)
-----   Message from Sharlee Blew, MD sent at 03/12/2019  9:34 AM EDT -----  Call and let aptien tknow   1) INR stable; repeat INR one month.    2) Creatinine is stable.  How is his fluid/volume/edema status?    Repeat labs in one month.  Set up appointment with me in Nov/Dec.    [name], M.D.     [facility] at [facility]   [address]  [address]  [place], [place]  [address]  [phone]

## 2019-03-17 NOTE — Unmapped (Signed)
Patient has been notified of results, he will call back to schedule the o/v with Dr. Dellis Filbert.

## 2019-03-19 DIAGNOSIS — E1129 Type 2 diabetes mellitus with other diabetic kidney complication: Principal | ICD-10-CM

## 2019-03-19 DIAGNOSIS — E1165 Type 2 diabetes mellitus with hyperglycemia: Principal | ICD-10-CM

## 2019-03-19 MED FILL — MYFORTIC 180 MG TABLET,DELAYED RELEASE: ORAL | 30 days supply | Qty: 180 | Fill #3

## 2019-03-19 MED FILL — MYFORTIC 180 MG TABLET,DELAYED RELEASE: 30 days supply | Qty: 180 | Fill #3 | Status: AC

## 2019-03-19 MED FILL — TACROLIMUS 1 MG CAPSULE: 30 days supply | Qty: 150 | Fill #0 | Status: AC

## 2019-03-23 MED ORDER — TRULICITY 1.5 MG/0.5 ML SUBCUTANEOUS PEN INJECTOR: mL | 0 refills | 0 days | Status: AC

## 2019-04-08 NOTE — Unmapped (Signed)
[facility] Refill Coordination Note    Specialty Medication(s) to be Shipped:   Transplant: Myfortic 180mg  and tacrolimus 1mg     Other medication(s) to be shipped: N/A     David Ellis, DOB: 04/07/1946  Phone: [phone] (home)       All above HIPAA information was verified with patient.     Completed refill call assessment today to schedule patient's medication shipment from the [facility] [phone]).       Specialty medication(s) and dose(s) confirmed: Regimen is correct and unchanged.   Changes to medications: Justyn reports no changes at this time.  Changes to insurance: No  Questions for the pharmacist: No    Confirmed patient received Welcome Packet with first shipment. The patient will receive a drug information handout for each medication shipped and additional FDA Medication Guides as required.       DISEASE/MEDICATION-SPECIFIC INFORMATION        N/A    SPECIALTY MEDICATION ADHERENCE     Medication Adherence    Patient reported X missed doses in the last month: 0  Specialty Medication: Myfortic 180mg   Patient is on additional specialty medications: Yes  Additional Specialty Medications: Tacrolimus 1mg   Patient Reported Additional Medication X Missed Doses in the Last Month: 0  Patient is on more than two specialty medications: No  Adherence tools used: patient uses a pill box to manage medications  Support network for adherence: family member          Myfortic 180 mg: 14 days of medicine on hand   Tacrolimus 1 mg: 14 days of medicine on hand     SHIPPING     Shipping address confirmed in Epic.     Delivery Scheduled: Yes, Expected medication delivery date: 04/22/2019.     Medication will be delivered via UPS to the prescription address in Epic [place].    Lorelei Pont [facility] Specialty Technician

## 2019-04-13 ENCOUNTER — Ambulatory Visit: Admit: 2019-04-13 | Discharge: 2019-04-14 | Payer: MEDICARE

## 2019-04-13 DIAGNOSIS — L97529 Non-pressure chronic ulcer of other part of left foot with unspecified severity: Secondary | ICD-10-CM

## 2019-04-13 DIAGNOSIS — E11621 Type 2 diabetes mellitus with foot ulcer: Principal | ICD-10-CM

## 2019-04-13 LAB — COMPREHENSIVE METABOLIC PANEL
ALBUMIN: 3.8 g/dL (ref 3.5–5.0)
ALKALINE PHOSPHATASE: 106 U/L (ref 38–126)
ALT (SGPT): 18 U/L (ref <50)
ANION GAP: 6 mmol/L — ABNORMAL LOW (ref 7–15)
BILIRUBIN TOTAL: 0.8 mg/dL (ref 0.0–1.2)
BLOOD UREA NITROGEN: 27 mg/dL — ABNORMAL HIGH (ref 7–21)
BUN / CREAT RATIO: 20
CALCIUM: 9.3 mg/dL (ref 8.5–10.2)
CHLORIDE: 102 mmol/L (ref 98–107)
CO2: 31 mmol/L — ABNORMAL HIGH (ref 22.0–30.0)
CREATININE: 1.35 mg/dL — ABNORMAL HIGH (ref 0.70–1.30)
EGFR CKD-EPI AA MALE: 61 mL/min/{1.73_m2} (ref >=60)
EGFR CKD-EPI NON-AA MALE: 52 mL/min/{1.73_m2} — ABNORMAL LOW (ref >=60)
GLUCOSE RANDOM: 182 mg/dL — ABNORMAL HIGH (ref 70–179)
POTASSIUM: 5 mmol/L (ref 3.5–5.0)
PROTEIN TOTAL: 6.5 g/dL (ref 6.5–8.3)
SODIUM: 139 mmol/L (ref 135–145)

## 2019-04-13 LAB — CBC W/ AUTO DIFF
BASOPHILS ABSOLUTE COUNT: 0 10*9/L (ref 0.0–0.1)
BASOPHILS RELATIVE PERCENT: 0.4 %
EOSINOPHILS RELATIVE PERCENT: 1.3 %
HEMATOCRIT: 49.3 % (ref 41.0–53.0)
HEMOGLOBIN: 15.5 g/dL (ref 13.5–17.5)
LARGE UNSTAINED CELLS: 2 % (ref 0–4)
LYMPHOCYTES ABSOLUTE COUNT: 1.2 10*9/L — ABNORMAL LOW (ref 1.5–5.0)
LYMPHOCYTES RELATIVE PERCENT: 16 %
MEAN CORPUSCULAR HEMOGLOBIN CONC: 31.4 g/dL (ref 31.0–37.0)
MEAN CORPUSCULAR HEMOGLOBIN: 27.6 pg (ref 26.0–34.0)
MEAN CORPUSCULAR VOLUME: 87.6 fL (ref 80.0–100.0)
MEAN PLATELET VOLUME: 9.1 fL (ref 7.0–10.0)
MONOCYTES ABSOLUTE COUNT: 0.5 10*9/L (ref 0.2–0.8)
MONOCYTES RELATIVE PERCENT: 6.3 %
NEUTROPHILS ABSOLUTE COUNT: 5.6 10*9/L (ref 2.0–7.5)
NEUTROPHILS RELATIVE PERCENT: 74.5 %
RED BLOOD CELL COUNT: 5.62 10*12/L (ref 4.50–5.90)
RED CELL DISTRIBUTION WIDTH: 14.3 % (ref 12.0–15.0)
WBC ADJUSTED: 7.5 10*9/L (ref 4.5–11.0)

## 2019-04-13 LAB — LIPID PANEL
CHOLESTEROL/HDL RATIO SCREEN: 3.6 (ref <5.0)
CHOLESTEROL: 125 mg/dL (ref 100–199)
HDL CHOLESTEROL: 35 mg/dL — ABNORMAL LOW (ref 40–59)
NON-HDL CHOLESTEROL: 90 mg/dL
TRIGLYCERIDES: 166 mg/dL — ABNORMAL HIGH (ref 1–149)
VLDL CHOLESTEROL CAL: 33.2 mg/dL (ref 12–42)

## 2019-04-13 LAB — HEMOGLOBIN A1C
HEMOGLOBIN A1C: 6.5 % — ABNORMAL HIGH (ref 4.8–5.6)
Hemoglobin A1c/Hemoglobin.total:MFr:Pt:Bld:Qn:: 6.5 — ABNORMAL HIGH

## 2019-04-13 LAB — TACROLIMUS BLOOD: Lab: 5.8

## 2019-04-13 LAB — PROTIME: Coagulation tissue factor induced:Time:Pt:PPP:Qn:Coag: 20.7 — ABNORMAL HIGH

## 2019-04-13 LAB — FASTING

## 2019-04-13 LAB — PHOSPHORUS: Phosphate:MCnc:Pt:Ser/Plas:Qn:: 4

## 2019-04-13 LAB — EOSINOPHILS ABSOLUTE COUNT: Eosinophils:NCnc:Pt:Bld:Qn:Automated count: 0.1

## 2019-04-13 LAB — EGFR CKD-EPI AA MALE: Lab: 61

## 2019-04-13 NOTE — Unmapped (Signed)
Problem List Items Addressed This Visit        High    Diabetic ulcer of left foot associated with type 2 diabetes mellitus (CMS-HCC)     Certainly needs diabetic shoes.  He did ask about an orthotic.   I wrote him for one, but warned him to make sure that it does not cause excessive wear to his skin since he is at ery high risk for infection and poor healing.

## 2019-04-13 NOTE — Unmapped (Signed)
Certainly needs diabetic shoes.  He did ask about an orthotic.   I wrote him for one, but warned him to make sure that it does not cause excessive wear to his skin since he is at ery high risk for infection and poor healing.

## 2019-04-13 NOTE — Unmapped (Signed)
Patient ID: David Ellis is a 73 y.o. male who presents for eval of medical issues.    Assessment/Plan:        Diabetic ulcer of left foot associated with type 2 diabetes mellitus (CMS-HCC)  Certainly needs diabetic shoes.  He did ask about an orthotic.   I wrote him for one, but warned him to make sure that it does not cause excessive wear to his skin since he is at ery high risk for infection and poor healing.        No orders of the defined types were placed in this encounter.      Medication adherence and barriers to the treatment plan have been addressed. Opportunities to optimize healthy behaviors have been discussed. Patient / caregiver voiced understanding.       Return in about 4 months (around 08/11/2019).     Subjective:     Current Health Status  Patient Active Problem List    Diagnosis Date Noted   ??? Diabetic ulcer of left foot associated with type 2 diabetes mellitus (CMS-HCC) 07/03/2014     Priority: High   ??? Routine general medical examination at a health care facility 11/17/2013     Priority: High   ??? Atrial fibrillation (CMS-HCC) 07/06/2013     Priority: High   ??? Right ventricular dysfunction 06/30/2013     Priority: High   ??? Pulmonary embolism (CMS-HCC) 05/23/2013     Priority: High   ??? Transplanted kidney      Priority: High   ??? Mixed hyperlipidemia 10/30/2011     Priority: High   ??? Type II diabetes mellitus with renal manifestations, uncontrolled (CMS-HCC) 03/26/2011     Priority: High   ??? Hypertension 10/10/2010     Priority: High   ??? Syncope 02/03/2019   ??? Immunosuppressed status (CMS-HCC) 09/02/2018   ??? Class 2 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 01/01/2018   ??? PAD (peripheral artery disease) (CMS-HCC) 02/12/2017   ??? Hypoglycemia associated with type 2 diabetes mellitus (CMS-HCC) 09/03/2016   ??? BCC (basal cell carcinoma), face 07/18/2015   ??? Left knee pain 11/09/2014   ??? On statin therapy due to risk of future cardiovascular event 10/04/2014   ??? Toe amputation status 08/10/2014 ??? Acquired absence of other toe(s), unspecified side (CMS-HCC) 08/10/2014   ??? Diabetic polyneuropathy associated with type 2 diabetes mellitus (CMS-HCC) 03/15/2014   ??? Aftercare following organ transplant 08/27/2013   ??? Edema 06/30/2013   ??? 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 History of kidney transplant 10/30/2011   ??? 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 Diverticulosis of colon 03/09/2011   ??? History of colonic polyps 03/09/2011      Here today for eval of medical issues.  Needs testing for diabetic shoes.    NO new ulceration, but known history of neuropathy, vasculopathy, amputation in the past.  REmains on same medcations.    Allergies   Allergen Reactions   ??? Levofloxacin Other (See Comments)     Other reaction(s): Unknown  Unknown   ??? Lidocaine Other (See Comments)     I don't remember States is fine with Bupivicaine   ??? Penicillins      As child  Other reaction(s): UNKNOWN.   (UPDATE: Tolerated amp/sulbactam without side effects during 10/2014 hospitalization)   ??? Egg Derived Rash   ??? Enalapril Cough   ??? Epinephrine Palpitations   ??? Grass Pollen-[place], Standard Itching   ???  Lisinopril Rash   ??? Mepivacaine Hcl Palpitations       Current Outpatient Medications   Medication Sig Dispense Refill   ??? aspirin (ECOTRIN) 81 MG tablet Take 81 mg by mouth continuous as needed. Only when flying and/or traveling by car more than 2 hours.     ??? BD INSULIN SYRINGE ULTRA-FINE 0.3 mL 31 gauge x 15/64 Syrg 1 each by Other route Three (3) times a day. 300 Syringe 3   ??? blood sugar diagnostic (FREESTYLE PRECISION NEO STRIPS) Strp Use to test blood sugar 4 times daily. Dx E11.65. 150 each 5   ??? cholecalciferol, vitamin D3, (VITAMIN D3) 2,000 unit Tab Take 2,000 Units by mouth daily.      ??? furosemide (LASIX) 40 MG tablet TAKE 1 TO 2 TABLETS ONCE A DAY 180 each 3 ??? gabapentin (NEURONTIN) 300 MG capsule Take 1 capsule (300 mg total) by mouth Two (2) times a day. 180 each 3   ??? HUMULIN R U-500, CONC, KWIKPEN 500 unit/mL (3 mL) CONCENTRATED injection DIAL TO MAX 100 UNITS BEFORE BREAKFAST, LUNCH, AND DINNER.  INJECT BEFOREMEALS MAX OF 300 UNITS DAILY 18 mL 11   ??? hydrocortisone 2.5 % cream   0   ??? ketoconazole (NIZORAL) 2 % cream   0   ??? magnesium chloride (SLOW_MAG) 64 mg TbEC Take 128 mg by mouth three (3) times a day (at 6am, noon and 6pm).      ??? metoprolol succinate (TOPROL-XL) 100 MG 24 hr tablet Take 1 tablet (100 mg total) by mouth two (2) times a day. 180 tablet 3   ??? miscellaneous medical supply Misc 1 application by Miscellaneous route daily as needed. Lightweight wheelchair. 1 each 0   ??? multivitamin capsule Take 1 capsule by mouth daily.     ??? MYFORTIC 180 mg EC tablet Take 3 tablets (540 mg total) by mouth two (2) times a day. 180 tablet 35   ??? pen needle, diabetic (BD ULTRA-FINE NANO PEN NEEDLE) 32 gauge x 5/32 (4 mm) Ndle 1 each by Other route Three (3) times a day before meals. 300 each 3   ??? pravastatin (PRAVACHOL) 20 MG tablet TAKE 1 TABLET BY MOUTH DAILY 90 each 3   ??? tacrolimus (PROGRAF) 1 MG capsule Take 3 capsules (3mg ) by mouth in the morning and 2 capsules (2mg ) in the evening. 450 capsule 3   ??? TRULICITY 1.5 mg/0.5 mL PnIj INJECT 1.5MG (0.5ML) SUBCUTANEOUSLY ONCE A WEEK 2 mL 0   ??? warfarin (COUMADIN) 2.5 MG tablet TAKE ONE TABLET BY MOUTH EVERY MONDAY ORAS DIRECTED 30 each 12   ??? warfarin (COUMADIN) 5 MG tablet 5 days a week 30 each 12   ??? blood-glucose meter kit Use as instructed. 1 each 0     No current facility-administered medications for this visit.      Facility-Administered Medications Ordered in Other Visits   Medication Dose Route Frequency Provider Last Rate Last Dose   ??? bevacizumab    PRN (once a day) Melvyn Novas, MD   2.5 mg at 01/28/17 0750       Past Medical History:   Diagnosis Date   ??? Atrial flutter (CMS-HCC) ??? Diabetes mellitus (CMS-HCC)    ??? Diabetic nephropathy (CMS-HCC)    ??? Diabetic retinopathy (CMS-HCC)    ??? Fractures    ??? Ganglion cyst    ??? Hand injury    ??? Heart disease    ??? Hyperlipidemia    ??? Hypertension    ???  Joint pain    ??? Osteomyelitis (CMS-HCC) June 2016   ??? Pulmonary embolism (CMS-HCC) Dec 2015   ??? 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 Stroke (CMS-HCC)    ??? Tear of meniscus of knee    ??? 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 NEPHRECTOMY TRANSPLANTED ORGAN Right     LURD - spouse received in 2013.   ??? PR AMPUTATION METATARSAL+TOE,SINGLE Left 07/06/2014    Procedure: AMPUTATION, METATARSAL, WITH TOE SINGLE;  Surgeon: Marion Downer, MD;  Location: MAIN OR [facility];  Service: Vascular   ??? PR AMPUTATION METATARSAL+TOE,SINGLE Left 10/28/2014    Procedure: AMPUTATION, METATARSAL, WITH TOE SINGLE;  Surgeon: Maple Mirza, MD;  Location: MAIN OR [facility];  Service: Vascular   ??? PR VITRECTOMY,PANRETINAL LASER RX Right 11/26/2016    Procedure: VITRECTOMY, MECHANICAL, PARS PLANA APPROACH; WITH ENDOLASER PANRETINAL PHOTOCOAGULATION;  Surgeon: Melvyn Novas, MD;  Location: [facility] OR [facility];  Service: Ophthalmology   ??? PR VITRECTOMY,PANRETINAL LASER RX Left 01/28/2017    Procedure: VITRECTOMY, MECHANICAL, PARS PLANA APPROACH; WITH ENDOLASER PANRETINAL PHOTOCOAGULATION;  Surgeon: Melvyn Novas, MD;  [facility] OR [facility];  Service: Ophthalmology   ??? SKIN BIOPSY         Family History   Problem Relation Age of Onset   ??? Kidney disease Mother    ??? Diabetes Mother    ??? Kidney disease Father    ??? Kidney disease Maternal Grandmother    ??? Diabetes Maternal Grandmother    ??? Glaucoma Neg Hx    ??? Macular degeneration Neg Hx        Social History     Tobacco Use   ??? Smoking status: Never Smoker   ??? Smokeless tobacco: Never Used   Substance Use Topics   ??? Alcohol use: No   ??? Drug use: No Objective:       Vital Signs  BP 138/84 (BP Site: L Arm, BP Position: Sitting)  - Pulse 77  - Wt (!) 114.3 kg (252 lb)  - SpO2 97%  - BMI 33.25 kg/m??      Exam  EXT:  No c/c/e.  Dry feet.  Left 4th and 5th foot amputated.  NO ulcerations today.  Pulses 1+ bilaterally and symmetric.

## 2019-04-17 NOTE — Unmapped (Signed)
-----   Message from Sharlee Blew, MD sent at 04/17/2019  8:20 AM EST -----  Call and let patient labs are stable.    A1C, cratinine, chol all look good.    INR is slightly low.  Has been stable in the past.  Same dose; repeat INR next week or the following (prior to Thanksgiing).    [name], M.D.     [facility] Internal Medicine at [facility]   [address]  [address]  [place], [place]  [address]  [phone]

## 2019-04-19 DIAGNOSIS — E1129 Type 2 diabetes mellitus with other diabetic kidney complication: Principal | ICD-10-CM

## 2019-04-19 DIAGNOSIS — E1165 Type 2 diabetes mellitus with hyperglycemia: Principal | ICD-10-CM

## 2019-04-20 MED ORDER — TRULICITY 1.5 MG/0.5 ML SUBCUTANEOUS PEN INJECTOR
5 refills | 0 days | Status: CP
Start: 2019-04-20 — End: ?

## 2019-04-21 MED FILL — TACROLIMUS 1 MG CAPSULE, IMMEDIATE-RELEASE: 30 days supply | Qty: 150 | Fill #1

## 2019-04-21 MED FILL — MYFORTIC 180 MG TABLET,DELAYED RELEASE: ORAL | 30 days supply | Qty: 180 | Fill #4

## 2019-04-21 MED FILL — MYFORTIC 180 MG TABLET,DELAYED RELEASE: 30 days supply | Qty: 180 | Fill #4 | Status: AC

## 2019-04-21 MED FILL — TACROLIMUS 1 MG CAPSULE: 30 days supply | Qty: 150 | Fill #1 | Status: AC

## 2019-04-22 NOTE — Unmapped (Signed)
I have ordered labs monthly (INR, CBC, tacrolimus, CMP)  Lab collect which can be done anywhere else.    [name], M.D.     [facility] Internal Medicine at [facility]   [address]  [address]  [place], [place]  [address]  [phone]

## 2019-04-22 NOTE — Unmapped (Signed)
Patient states that his wife is having surgery tomorrow and that he will not have a way here.   Patient would like to have his labs drawn at [facility], could you please place monthly labs and INR lab orders, thank you.

## 2019-04-27 ENCOUNTER — Ambulatory Visit: Admit: 2019-04-27 | Discharge: 2019-04-28 | Payer: MEDICARE

## 2019-04-27 DIAGNOSIS — T8610 Unspecified complication of kidney transplant: Principal | ICD-10-CM

## 2019-04-27 DIAGNOSIS — R791 Abnormal coagulation profile: Principal | ICD-10-CM

## 2019-04-27 LAB — PROTIME-INR: INR: 1.82

## 2019-04-27 LAB — INR: Coagulation tissue factor induced.INR:RelTime:Pt:PPP:Qn:Coag: 1.82

## 2019-04-29 NOTE — Unmapped (Signed)
Anticoagulation tracker updated per Dr. Dorthula Rue change to 5 mg daily per telephone encounter on 04/29/19.

## 2019-04-29 NOTE — Unmapped (Signed)
-----   Message from Sharlee Blew, MD sent at 04/28/2019  8:11 AM EST -----  INR remains sl low.  What is current dosing?    [name], M.D.     [facility] Internal Medicine at [facility]   [address]  [address]  [place], [place]  [address]  [phone]

## 2019-04-29 NOTE — Unmapped (Signed)
Per mychart msg yesterday, patient is taking 2.5 mg coumadin on Monday and Tuesdays, and 5 mg all other days

## 2019-04-29 NOTE — Unmapped (Signed)
I've spoken with him via mychart.  Increase to 5 mg daily except Mon 2.5    Repeat INR two weeks.    [name], M.D.     [facility] Internal Medicine at [facility]   [address]  [address]  [place], [place]  [address]  [phone]

## 2019-05-11 NOTE — Unmapped (Signed)
[facility] Clinical Assessment & Refill Coordination Note    David Ellis, DOB: 13-Jan-1946  Phone: [phone] (home)     All above HIPAA information was verified with patient.     Was a Nurse, learning disability used for this call? No    Specialty Medication(s):   Transplant: Myfortic 180mg  and tacrolimus 1mg      Current Outpatient Medications   Medication Sig Dispense Refill   ??? aspirin (ECOTRIN) 81 MG tablet Take 81 mg by mouth continuous as needed. Only when flying and/or traveling by car more than 2 hours.     ??? BD INSULIN SYRINGE ULTRA-FINE 0.3 mL 31 gauge x 15/64 Syrg 1 each by Other route Three (3) times a day. 300 Syringe 3   ??? blood sugar diagnostic (FREESTYLE PRECISION NEO STRIPS) Strp Use to test blood sugar 4 times daily. Dx E11.65. 150 each 5   ??? blood-glucose meter kit Use as instructed. 1 each 0   ??? cholecalciferol, vitamin D3, (VITAMIN D3) 2,000 unit Tab Take 2,000 Units by mouth daily.      ??? furosemide (LASIX) 40 MG tablet TAKE 1 TO 2 TABLETS ONCE A DAY 180 each 3   ??? gabapentin (NEURONTIN) 300 MG capsule Take 1 capsule (300 mg total) by mouth Two (2) times a day. 180 each 3   ??? HUMULIN R U-500, CONC, KWIKPEN 500 unit/mL (3 mL) CONCENTRATED injection DIAL TO MAX 100 UNITS BEFORE BREAKFAST, LUNCH, AND DINNER.  INJECT BEFOREMEALS MAX OF 300 UNITS DAILY 18 mL 11   ??? hydrocortisone 2.5 % cream   0   ??? ketoconazole (NIZORAL) 2 % cream   0   ??? magnesium chloride (SLOW_MAG) 64 mg TbEC Take 128 mg by mouth three (3) times a day (at 6am, noon and 6pm).      ??? metoprolol succinate (TOPROL-XL) 100 MG 24 hr tablet Take 1 tablet (100 mg total) by mouth two (2) times a day. 180 tablet 3   ??? miscellaneous medical supply Misc 1 application by Miscellaneous route daily as needed. Lightweight wheelchair. 1 each 0   ??? multivitamin capsule Take 1 capsule by mouth daily.     ??? MYFORTIC 180 mg EC tablet Take 3 tablets (540 mg total) by mouth two (2) times a day. 180 tablet 35 ??? pen needle, diabetic (BD ULTRA-FINE NANO PEN NEEDLE) 32 gauge x 5/32 (4 mm) Ndle 1 each by Other route Three (3) times a day before meals. 300 each 3   ??? pravastatin (PRAVACHOL) 20 MG tablet TAKE 1 TABLET BY MOUTH DAILY 90 each 3   ??? tacrolimus (PROGRAF) 1 MG capsule Take 3 capsules (3mg ) by mouth in the morning and 2 capsules (2mg ) in the evening. 450 capsule 3   ??? TRULICITY 1.5 mg/0.5 mL PnIj INJECT 1.5MG (0.5ML) SUBCUTANEOUSLY ONCE A WEEK 2 mL 5   ??? warfarin (COUMADIN) 2.5 MG tablet TAKE ONE TABLET BY MOUTH EVERY MONDAY ORAS DIRECTED 30 each 12   ??? warfarin (COUMADIN) 5 MG tablet 5 days a week 30 each 12     No current facility-administered medications for this visit.      Facility-Administered Medications Ordered in Other Visits   Medication Dose Route Frequency Provider Last Rate Last Dose   ??? bevacizumab    PRN (once a day) Melvyn Novas, MD   2.5 mg at 01/28/17 0750        Changes to medications: Jejuan reports no changes at this time.    Allergies  Allergen Reactions   ??? Levofloxacin Other (See Comments)     Other reaction(s): Unknown  Unknown   ??? Lidocaine Other (See Comments)     I don't remember States is fine with Bupivicaine   ??? Penicillins      As child  Other reaction(s): UNKNOWN.   (UPDATE: Tolerated amp/sulbactam without side effects during 10/2014 hospitalization)   ??? Egg Derived Rash   ??? Enalapril Cough   ??? Epinephrine Palpitations   ??? Grass Pollen-[place], Standard Itching   ??? Lisinopril Rash   ??? Mepivacaine Hcl Palpitations       Changes to allergies: No    SPECIALTY MEDICATION ADHERENCE     Myfortic  180 mg: 13 days of medicine on hand   Tacrolimus 1 mg: 13 days of medicine on hand       Medication Adherence    Patient reported X missed doses in the last month: 0  Specialty Medication: Tacrolimus 1mg   Patient is on additional specialty medications: Yes  Additional Specialty Medications: Myfortic 180mg   Patient Reported Additional Medication X Missed Doses in the Last Month: 0 Adherence tools used: patient uses a pill box to manage medications  Support network for adherence: family member          Specialty medication(s) dose(s) confirmed: Regimen is correct and unchanged.     Are there any concerns with adherence? No    Adherence counseling provided? Not needed    CLINICAL MANAGEMENT AND INTERVENTION      Clinical Benefit Assessment:    Do you feel the medicine is effective or helping your condition? Yes    Clinical Benefit counseling provided? Not needed    Adverse Effects Assessment:    Are you experiencing any side effects? No    Are you experiencing difficulty administering your medicine? No    Quality of Life Assessment:    How many days over the past month did your kidney transplant  keep you from your normal activities? For example, brushing your teeth or getting up in the morning. 0    Have you discussed this with your provider? Not needed    Therapy Appropriateness:    Is therapy appropriate? Yes, therapy is appropriate and should be continued    DISEASE/MEDICATION-SPECIFIC INFORMATION      N/A    PATIENT SPECIFIC NEEDS     ? Does the patient have any physical, cognitive, or cultural barriers? No    ? Is the patient high risk? Yes, patient is taking a REMS drug. Medication is dispensed in compliance with REMS program.     ? Does the patient require a Care Management Plan? No     ? Does the patient require physician intervention or other additional services (i.e. nutrition, smoking cessation, social work)? No      SHIPPING     Specialty Medication(s) to be Shipped:   Transplant: Myfortic 180mg  and tacrolimus 1mg     Other medication(s) to be shipped: none     Changes to insurance: No    Delivery Scheduled: Yes, Expected medication delivery date: 05/21/2019.     Medication will be delivered via UPS to the confirmed prescription address in [facility]. The patient will receive a drug information handout for each medication shipped and additional FDA Medication Guides as required.  Verified that patient has previously received a Conservation officer, historic buildings.    All of the patient's questions and concerns have been addressed.    [name]   [facility]  Specialty Pharmacist

## 2019-05-19 ENCOUNTER — Ambulatory Visit: Admit: 2019-05-19 | Discharge: 2019-05-19 | Payer: MEDICARE

## 2019-05-19 DIAGNOSIS — Z940 Kidney transplant status: Principal | ICD-10-CM

## 2019-05-19 LAB — PROTIME: Coagulation tissue factor induced:Time:Pt:PPP:Qn:Coag: 34.8 — ABNORMAL HIGH

## 2019-05-19 LAB — CBC W/ AUTO DIFF
BASOPHILS ABSOLUTE COUNT: 0 10*9/L (ref 0.0–0.1)
BASOPHILS RELATIVE PERCENT: 0.3 %
EOSINOPHILS ABSOLUTE COUNT: 0.1 10*9/L (ref 0.0–0.4)
EOSINOPHILS RELATIVE PERCENT: 1.1 %
HEMATOCRIT: 49.2 % (ref 41.0–53.0)
HEMOGLOBIN: 16 g/dL (ref 13.5–17.5)
LARGE UNSTAINED CELLS: 1 % (ref 0–4)
LYMPHOCYTES ABSOLUTE COUNT: 1.2 10*9/L — ABNORMAL LOW (ref 1.5–5.0)
MEAN CORPUSCULAR HEMOGLOBIN CONC: 32.6 g/dL (ref 31.0–37.0)
MEAN CORPUSCULAR HEMOGLOBIN: 27.8 pg (ref 26.0–34.0)
MEAN CORPUSCULAR VOLUME: 85.2 fL (ref 80.0–100.0)
MEAN PLATELET VOLUME: 8 fL (ref 7.0–10.0)
MONOCYTES ABSOLUTE COUNT: 0.6 10*9/L (ref 0.2–0.8)
MONOCYTES RELATIVE PERCENT: 6.6 %
NEUTROPHILS ABSOLUTE COUNT: 6.7 10*9/L (ref 2.0–7.5)
NEUTROPHILS RELATIVE PERCENT: 76.6 %
PLATELET COUNT: 242 10*9/L (ref 150–440)
RED BLOOD CELL COUNT: 5.78 10*12/L (ref 4.50–5.90)
WBC ADJUSTED: 8.8 10*9/L (ref 4.5–11.0)

## 2019-05-19 LAB — ALBUMIN / CREATININE URINE RATIO: ALBUMIN/CREATININE RATIO: 264.7 ug/mg — ABNORMAL HIGH (ref 0.0–30.0)

## 2019-05-19 LAB — COMPREHENSIVE METABOLIC PANEL
ALBUMIN: 4 g/dL (ref 3.5–5.0)
ALKALINE PHOSPHATASE: 107 U/L (ref 38–126)
ALT (SGPT): 19 U/L (ref <50)
ANION GAP: 12 mmol/L (ref 7–15)
AST (SGOT): 29 U/L (ref 19–55)
BILIRUBIN TOTAL: 0.5 mg/dL (ref 0.0–1.2)
BLOOD UREA NITROGEN: 28 mg/dL — ABNORMAL HIGH (ref 7–21)
BUN / CREAT RATIO: 20
CALCIUM: 9.3 mg/dL (ref 8.5–10.2)
CHLORIDE: 101 mmol/L (ref 98–107)
CO2: 28 mmol/L (ref 22.0–30.0)
CREATININE: 1.4 mg/dL — ABNORMAL HIGH (ref 0.70–1.30)
EGFR CKD-EPI AA MALE: 58 mL/min/{1.73_m2} — ABNORMAL LOW (ref >=60)
EGFR CKD-EPI NON-AA MALE: 50 mL/min/{1.73_m2} — ABNORMAL LOW (ref >=60)
POTASSIUM: 4.3 mmol/L (ref 3.5–5.0)
PROTEIN TOTAL: 6.8 g/dL (ref 6.5–8.3)

## 2019-05-19 LAB — EGFR CKD-EPI NON-AA MALE: Lab: 50 — ABNORMAL LOW

## 2019-05-19 LAB — PHOSPHORUS: Phosphate:MCnc:Pt:Ser/Plas:Qn:: 2.8 — ABNORMAL LOW

## 2019-05-19 LAB — ALBUMIN QUANT URINE: Albumin:MCnc:Pt:Urine:Qn:: 32.9

## 2019-05-19 LAB — EOSINOPHILS ABSOLUTE COUNT: Eosinophils:NCnc:Pt:Bld:Qn:Automated count: 0.1

## 2019-05-19 NOTE — Unmapped (Signed)
PCP:  David Addison, MD      ASSESSMENT/PLAN: [name] is a 73 y.o. 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    1. S/P LURD kidney transplant 2013 with baseline creatinine ~1.6-2.0,  His electrolytes are WNL. Tac troughs within goal of 4-6. Urine cytology for decoy cells was negative.    2. HTN -- BP much better off some of the medications.    3. T2DM. HbA1c a bit low and having low sugars. He will discuss with his endocrinologist. History of proliferative retinopathy s/p PPV/endolaser treatments, last evaluated by ophthalmology in 01/2019. Has appointment in the next month or two.    4. ASHD/CVA history. Continue pravastatin and ASA.     5. A-Fib. Continue coumadin. INR f/b Dr. Dellis Ellis.    6. HM: Had his flu shot and UTD on pneumonia and shinrix. Was due for colonoscopy but reportedly GI is leery of stopping anticoagulation so are following cologuard test results. No FH colon CA. Continue biannual follow-up with dermatology re: recurrent basal cell CA.     7. High falls risk: No recent falls. Has walker and cane but still very unstable with this and not getting out of the house because of this.     [name] will see Dr. Margaretmary Ellis for his annual transplant visit in March and return to see me in 6 months.  ______________________________________________________________________________________________________ HPI: [name] is a 73 y.o. man s/p LURD kidney transplant (from his wife, David Ellis) 08/21/2011 and B/L creatinine ~1.4-1.7 who returns for f/u. 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     He has had multiple complications since his transplant including a large, provoked (long car ride) pulmonary embolus requiring thrombolysis (Dec 2014), osteomyelitis of his left foot requiring 4th/5th toe amputation (2016), recurrent basal cell CA, CVA s/p thombolysis (07/2015). He has significant PAD and neuropathy and has no sensation on either leg below his knees. He also has severe retinopathy and his vision is quite poor so no longer drives.     Interval history: At his last visit, we noticed his creatinine was creeping up with tac troughs running 6-8 so we dropped his tacrolimus from 3mg  BID to 3mg  Qam/2mg  Qpm and now they are running 4-7 and creatinine has dropped down somewhat. He feels well and has no complaints. Denies F/C, CP, SOB, cough, palpitations, N/V/D, hematuria/dysuria, LE edema.     ROS: As per HPI. The remainder of the 10 system review is negative.    PAST MEDICAL HISTORY:  Past Medical History:   Diagnosis Date   ??? Atrial flutter (CMS-HCC)    ??? Diabetes mellitus (CMS-HCC)    ??? Diabetic nephropathy (CMS-HCC)    ??? Diabetic retinopathy (CMS-HCC)    ??? Fractures    ??? Ganglion cyst    ??? Hand injury    ??? Heart disease    ??? Hyperlipidemia    ??? Hypertension    ??? Joint pain    ??? Osteomyelitis (CMS-HCC) June 2016   ??? Pulmonary embolism (CMS-HCC) Dec 2015   ??? 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 Stroke (CMS-HCC)    ??? Tear of meniscus of knee    ??? Transplanted kidney 08/21/2011 ALLERGIES  Levofloxacin; Lidocaine; Penicillins; Egg derived; Enalapril; Epinephrine; Grass pollen-bermuda, standard; Lisinopril; and Mepivacaine hcl    MEDICATIONS:  Current Outpatient Medications   Medication Sig Dispense Refill   ??? aspirin (ECOTRIN) 81 MG tablet Take 81 mg by mouth continuous as needed. Only when flying and/or traveling by car more than 2 hours.     ??? BD INSULIN SYRINGE ULTRA-FINE 0.3 mL 31 gauge x 15/64 Syrg 1 each by Other route Three (3) times a day. 300 Syringe 3   ??? blood sugar diagnostic (FREESTYLE PRECISION NEO STRIPS) Strp Use to test blood sugar 4 times daily. Dx E11.65. 150 each 5   ??? blood-glucose meter kit Use as instructed. 1 each 0   ??? cholecalciferol, vitamin D3, (VITAMIN D3) 2,000 unit Tab Take 2,000 Units by mouth daily.      ??? furosemide (LASIX) 40 MG tablet TAKE 1 TO 2 TABLETS ONCE A DAY 180 each 3   ??? gabapentin (NEURONTIN) 300 MG capsule Take 1 capsule (300 mg total) by mouth Two (2) times a day. 180 each 3   ??? HUMULIN R U-500, CONC, KWIKPEN 500 unit/mL (3 mL) CONCENTRATED injection DIAL TO MAX 100 UNITS BEFORE BREAKFAST, LUNCH, AND DINNER.  INJECT BEFOREMEALS MAX OF 300 UNITS DAILY 18 mL 11   ??? hydrocortisone 2.5 % cream   0   ??? ketoconazole (NIZORAL) 2 % cream   0   ??? magnesium chloride (SLOW_MAG) 64 mg TbEC Take 128 mg by mouth three (3) times a day (at 6am, noon and 6pm).      ??? metoprolol succinate (TOPROL-XL) 100 MG 24 hr tablet Take 1 tablet (100 mg total) by mouth two (2) times a day. 180 tablet 3   ??? miscellaneous medical supply Misc 1 application by Miscellaneous route daily as needed. Lightweight wheelchair. 1 each 0   ??? multivitamin capsule Take 1 capsule by mouth daily.     ??? MYFORTIC 180 mg EC tablet Take 3 tablets (540 mg total) by mouth two (2) times a day. 180 tablet 35   ??? pen needle, diabetic (BD ULTRA-FINE NANO PEN NEEDLE) 32 gauge x 5/32 (4 mm) Ndle 1 each by Other route Three (3) times a day before meals. 300 each 3 ??? pravastatin (PRAVACHOL) 20 MG tablet TAKE 1 TABLET BY MOUTH DAILY 90 each 3   ??? tacrolimus (PROGRAF) 1 MG capsule Take 3 capsules (3mg ) by mouth in the morning and 2 capsules (2mg ) in the evening. 450 capsule 3   ??? TRULICITY 1.5 mg/0.5 mL PnIj INJECT 1.5MG (0.5ML) SUBCUTANEOUSLY ONCE A WEEK 2 mL 5   ??? warfarin (COUMADIN) 2.5 MG tablet TAKE ONE TABLET BY MOUTH EVERY MONDAY ORAS DIRECTED 30 each 12   ??? warfarin (COUMADIN) 5 MG tablet 5 days a week 30 each 12     No current facility-administered medications for this visit.      Facility-Administered Medications Ordered in Other Visits   Medication Dose Route Frequency Provider Last Rate Last Dose   ??? bevacizumab    PRN (once a day) Melvyn Novas, MD   2.5 mg at 01/28/17 0981     Physical exam:   There were no vitals filed for this visit.  HEENT: AI OP clear MMM  Neck: supple, No JVD  Cor: S1S2 RRR  Pulm: CTAB  Abd: Soft, NT  Ext: LLE no edema; RLE 1+ edema (chronic)  Neuro: alert and nonfocal; able to rise from seated position with assistance from cane    MEDICAL DECISION MAKING    Results for orders placed or performed in visit on 04/27/19   PT-INR   Result Value Ref Range    PT 21.1 (H) 10.5 - 13.5 sec    INR 1.82      *Note: Due to a large number of results and/or encounters for the requested time period, some results have not been displayed. A complete set of results can be found in Results Review.        Lab Results   Component Value Date    NA 139 04/13/2019    K 5.0 04/13/2019    CL 102 04/13/2019    CO2 31.0 (H) 04/13/2019    BUN 27 (H) 04/13/2019    CREATININE 1.35 (H) 04/13/2019    GFRAA 51 (L) 10/29/2017    GFRNONAA 42 (L) 10/29/2017    ALBUMIN 3.8 04/13/2019     Lab Results   Component Value Date    WBC 7.5 04/13/2019    HGB 15.5 04/13/2019    HCT 49.3 04/13/2019    PLT 236 04/13/2019     Lab Results   Component Value Date    PTH 192.3 (H) 11/27/2017    CALCIUM 9.3 04/13/2019    PHOS 4.0 04/13/2019

## 2019-05-19 NOTE — Unmapped (Signed)
AOBP   Patient's blood pressure was taken on left upper arm, medium cuff.   1st reading 133/84, pulse: 84  2nd reading 130/70, pulse: 83  3rd reading 136/73, pulse: 82  Average reading 133/76, pulse: 83

## 2019-05-20 DIAGNOSIS — I4891 Unspecified atrial fibrillation: Principal | ICD-10-CM

## 2019-05-20 LAB — TACROLIMUS BLOOD: Lab: 4.3

## 2019-05-20 MED FILL — TACROLIMUS 1 MG CAPSULE: 30 days supply | Qty: 150 | Fill #2 | Status: AC

## 2019-05-20 MED FILL — MYFORTIC 180 MG TABLET,DELAYED RELEASE: 30 days supply | Qty: 180 | Fill #5 | Status: AC

## 2019-05-20 MED FILL — MYFORTIC 180 MG TABLET,DELAYED RELEASE: ORAL | 30 days supply | Qty: 180 | Fill #5

## 2019-05-20 MED FILL — TACROLIMUS 1 MG CAPSULE, IMMEDIATE-RELEASE: 30 days supply | Qty: 150 | Fill #2

## 2019-05-20 NOTE — Unmapped (Signed)
Decrease to 5 mg daily except MOn, THu, take 2.5.    Repeat INR two or three weeks.    [name], M.D.     [facility] Internal Medicine at [facility]   [address]  [address]  [place], [place]  [address]  [phone]

## 2019-05-20 NOTE — Unmapped (Signed)
Patient notified of new dosing instructions, would like to get INR done [place], could you place the external order?   Anticoagulation tracker updated.

## 2019-05-20 NOTE — Unmapped (Signed)
I have ordered; I am NOT sure that they are doing labs there, but I have ordered regular INR monthly.    Lab collect.    [name], M.D.     [facility] Internal Medicine at [facility]   [address]  [address]  [place], [place]  [address]  [phone]

## 2019-05-20 NOTE — Unmapped (Signed)
Patient states that he is taking 2.5 mg coumadin on Mondays, and 5 mg all other days (a slight increase from last time).  He denies change in diet, new medications, or extra doses of coumadin.  Please advise.

## 2019-05-20 NOTE — Unmapped (Signed)
[facility] or [place]?    [name], M.D.     [facility] Internal Medicine at [facility]   [address]  [address]  [place], [place]  [address]  [phone]

## 2019-05-20 NOTE — Unmapped (Signed)
Result of INR on 05/19/19 is 3.09.  Dr. Dellis Filbert advised that patient reduce coumadin dose to 2.5 mg M,TH, and 5 mg all other days.

## 2019-05-20 NOTE — Unmapped (Signed)
[facility] please

## 2019-05-20 NOTE — Unmapped (Signed)
-----   Message from Sharlee Blew, MD sent at 05/20/2019  5:53 AM EST -----  Call and let patient know kidneys stable.    INR a hair high; what is current dosing?    [name], M.D.     [facility] Internal Medicine at [facility]   [address]  [address]  [place], [place]  [address]  [phone]

## 2019-05-28 NOTE — Unmapped (Signed)
Attempted to return call to [facility] re: denial of Shingrix vaccine. No answer so left vm at [phone]. Pt has documented both shingrix injections in 2019. Unsure why [employer] is reaching out now about a claim.

## 2019-06-02 ENCOUNTER — Ambulatory Visit: Admit: 2019-06-02 | Discharge: 2019-06-03 | Payer: MEDICARE

## 2019-06-03 NOTE — Unmapped (Signed)
Patient came in today for POC INR visit which read 1.9. Patient is currently taking 2.5 mg coumadin on Mondays and Thurs and 5 mg all other days. Denies any missed doses, diet changes, bleeding, or new medications.     Last INR was 3.1 on 05/19/19. At that time we decreased to above dosage from 2.5 once weekly and 5 mg all other days .    Patient unable to stay and wait for dosing advice.  Dr. Dellis Filbert please advise.

## 2019-06-03 NOTE — Unmapped (Signed)
Please asked patient to increase back up to 2.5 mg twice weekly and 5 mg all other days.  Return to clinic in 2 weeks for recheck. thx s

## 2019-06-03 NOTE — Unmapped (Signed)
Patient advised to go back to 2.5 mg coumadin twice weekly, and 5 mg all other days per Dr. Otis Dials, and to repeat in 2 weeks.

## 2019-06-04 NOTE — Unmapped (Signed)
note

## 2019-06-04 NOTE — Unmapped (Signed)
[employer] form for CGM received.

## 2019-06-09 ENCOUNTER — Ambulatory Visit: Admit: 2019-06-09 | Discharge: 2019-06-10 | Payer: MEDICARE | Attending: Internal Medicine | Primary: Internal Medicine

## 2019-06-09 DIAGNOSIS — I10 Essential (primary) hypertension: Principal | ICD-10-CM

## 2019-06-09 DIAGNOSIS — E785 Hyperlipidemia, unspecified: Principal | ICD-10-CM

## 2019-06-09 DIAGNOSIS — R609 Edema, unspecified: Principal | ICD-10-CM

## 2019-06-09 DIAGNOSIS — I4891 Unspecified atrial fibrillation: Principal | ICD-10-CM

## 2019-06-10 NOTE — Unmapped (Signed)
[facility] Clinical Assessment & Refill Coordination Note    David Ellis, DOB: 26-Jun-1945  Phone: [phone] (home)     All above HIPAA information was verified with patient.     Was a Nurse, learning disability used for this call? No    Specialty Medication(s):   Transplant: Myfortic 180mg  and tacrolimus 1mg      Current Outpatient Medications   Medication Sig Dispense Refill   ??? aspirin (ECOTRIN) 81 MG tablet Take 81 mg by mouth continuous as needed. Only when flying and/or traveling by car more than 2 hours.     ??? blood-glucose meter kit Use as instructed. 1 each 0   ??? cholecalciferol, vitamin D3, (VITAMIN D3) 2,000 unit Tab Take 2,000 Units by mouth daily.      ??? furosemide (LASIX) 40 MG tablet TAKE 1 TO 2 TABLETS ONCE A DAY 180 each 3   ??? gabapentin (NEURONTIN) 300 MG capsule Take 1 capsule (300 mg total) by mouth Two (2) times a day. 180 each 3   ??? HUMULIN R U-500, CONC, KWIKPEN 500 unit/mL (3 mL) CONCENTRATED injection DIAL TO MAX 100 UNITS BEFORE BREAKFAST, LUNCH, AND DINNER.  INJECT BEFOREMEALS MAX OF 300 UNITS DAILY 18 mL 11   ??? hydrocortisone 2.5 % cream   0   ??? ketoconazole (NIZORAL) 2 % cream   0   ??? magnesium chloride (SLOW_MAG) 64 mg TbEC Take 128 mg by mouth three (3) times a day (at 6am, noon and 6pm).      ??? metoprolol succinate (TOPROL-XL) 100 MG 24 hr tablet Take 1 tablet (100 mg total) by mouth two (2) times a day. 180 tablet 3   ??? miscellaneous medical supply Misc 1 application by Miscellaneous route daily as needed. Lightweight wheelchair. 1 each 0   ??? multivitamin capsule Take 1 capsule by mouth daily.     ??? MYFORTIC 180 mg EC tablet Take 3 tablets (540 mg total) by mouth two (2) times a day. 180 tablet 35   ??? pen needle, diabetic (BD ULTRA-FINE NANO PEN NEEDLE) 32 gauge x 5/32 (4 mm) Ndle 1 each by Other route Three (3) times a day before meals. 300 each 3   ??? pravastatin (PRAVACHOL) 20 MG tablet TAKE 1 TABLET BY MOUTH DAILY 90 each 3 ??? tacrolimus (PROGRAF) 1 MG capsule Take 3 capsules (3mg ) by mouth in the morning and 2 capsules (2mg ) in the evening. 450 capsule 3   ??? TRULICITY 1.5 mg/0.5 mL PnIj INJECT 1.5MG (0.5ML) SUBCUTANEOUSLY ONCE A WEEK 2 mL 5   ??? warfarin (COUMADIN) 2.5 MG tablet TAKE ONE TABLET BY MOUTH EVERY MONDAY ORAS DIRECTED 30 each 12   ??? warfarin (COUMADIN) 5 MG tablet 5 days a week 30 each 12     No current facility-administered medications for this visit.      Facility-Administered Medications Ordered in Other Visits   Medication Dose Route Frequency Provider Last Rate Last Dose   ??? bevacizumab    PRN (once a day) Melvyn Novas, MD   2.5 mg at 01/28/17 0750        Changes to medications: David Ellis reports no changes at this time.    Allergies   Allergen Reactions   ??? Levofloxacin Other (See Comments)     Other reaction(s): Unknown  Unknown   ??? Lidocaine Other (See Comments)     I don't remember States is fine with Bupivicaine   ??? Penicillins      As child  Other reaction(s): UNKNOWN.   (UPDATE: Tolerated amp/sulbactam without side effects during 10/2014 hospitalization)   ??? Egg Derived Rash   ??? Enalapril Cough   ??? Epinephrine Palpitations   ??? Grass Pollen-B[place]uda, Standard Itching   ??? Lisinopril Rash   ??? Mepivacaine Hcl Palpitations       Changes to allergies: No    SPECIALTY MEDICATION ADHERENCE     Myfortic 180mg   9 days of medicine on hand   Tacrolimus 1mg   : 9 days of medicine on hand     Medication Adherence    Patient reported X missed doses in the last month: 0  Specialty Medication: myfortic 180mg   Patient is on additional specialty medications: Yes  Additional Specialty Medications: Tacrolimus 1mg   Patient Reported Additional Medication X Missed Doses in the Last Month: 0  Adherence tools used: patient uses a pill box to manage medications  Support network for adherence: family member          Specialty medication(s) dose(s) confirmed: Regimen is correct and unchanged.     Are there any concerns with adherence? none Adherence counseling provided? Not needed    CLINICAL MANAGEMENT AND INTERVENTION      Clinical Benefit Assessment:    Do you feel the medicine is effective or helping your condition? Yes    Clinical Benefit counseling provided? Not needed    Adverse Effects Assessment:    Are you experiencing any side effects? No    Are you experiencing difficulty administering your medicine? No    Quality of Life Assessment:    How many days over the past month did your transplant  keep you from your normal activities? For example, brushing your teeth or getting up in the morning. 0    Have you discussed this with your provider? Not needed    Therapy Appropriateness:    Is therapy appropriate? Yes, therapy is appropriate and should be continued    DISEASE/MEDICATION-SPECIFIC INFORMATION      N/A    PATIENT SPECIFIC NEEDS     ? Does the patient have any physical, cognitive, or cultural barriers? No    ? Is the patient high risk? Yes, patient is taking a REMS drug. Medication is dispensed in compliance with REMS program.     ? Does the patient require a Care Management Plan? No     ? Does the patient require physician intervention or other additional services (i.e. nutrition, smoking cessation, social work)? No      SHIPPING     Specialty Medication(s) to be Shipped:   Transplant: Myfortic 180mg  and tacrolimus 1mg     Other medication(s) to be shipped: na     Changes to insurance: No    Delivery Scheduled: Yes, Expected medication delivery date: 06/17/2019.     Medication will be delivered via UPS to the confirmed prescription address in [facility]    The patient will receive a drug information handout for each medication shipped and additional FDA Medication Guides as required.  Verified that patient has previously received a Conservation officer, historic buildings.    All of the patient's questions and concerns have been addressed.    [name]   [facility] Specialty Pharmacist

## 2019-06-10 NOTE — Unmapped (Signed)
Assessment and Plan:   David Ellis is a 74 y.o. 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    1. Atrial fibrillation  Symptomatically doing well since last visit.  Reviewed recent ziopatch without recurrent atrial fibrillation.  Continue metoprolol at current dose.  We did review anticoagulation options.  Specifically, INR has been labile recently.  We did briefly discuss switching to direct oral anticoagulant for more stable anticoagulation and lower risk of bleeding.  I would have to review this with his kidney transplant team.  For now, patient expresses preference to stay on warfarin as he requires routine labs anyway given kidney transplant.  He also expresses concern about cost.  I will support him in this decision but asked him to notify me if he wishes to change.    2. Hypertension  Blood pressure has been very well controlled recently.  Recent labs stable as well.  Continue current therapy.    3. Hyperlipidemia  Lipids overall well controlled.  Continue pravastatin.     4.  Edema  Stable at this time off chlorthalidone.  Plan to continue furosemide at current dose.    I personally spent greater than 30 minutes in face-to-face and non-face-to-face care of this patient, which includes all pre, intra, and post visit time on the date of service.      Vevelyn Francois, MD  [facility] Cardiology  Pager [phone]      Subjective:   PCP: [name], MD  Patient: David Ellis  DOB: 03-10-1946    Reason for visit:  Afib, HTN, RV failure HPI: David Ellis is a 74 y.o. 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  Since his last visit, he reports doing very well.  He denies any issues with hypotension or syncope since last visit.  Blood pressures have been stable.  He denies any chest pain or shortness of breath.  He does have occasional issues with low blood sugar at night.  He may feel a bit off balance at this time.  He otherwise denies any other pertinent complaints today.    ______________________________________________________________________  Pertinent Medical History, Cardiovascular History & Procedures:    Pertinent PMH:   ?? AFib  ?? Typical atrial flutter s/p ablation 06/2011  ?? DM  ?? 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 ESRD s/p transplant  ?? HTN  ?? OSA on CPAP  ?? Skin CA  ?? PE s/p catheter directed lytics 05/2013  ?? 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 Wedge defects in left lung without any thrombus visualized, could represent chronic changes or small subsegmental PEs.    Risk Factors: HTN, DM     Cath / PCI:   ?? None    CV Surgery:   ?? None    EP Procedures and Devices:   ?? None    Non-Invasive Evaluation(s):   ?? Ziopatch 9/20: Predominantly sinus rhythm with rates 57 to 100 bpm, average heart rate 75 bpm.  71 episodes of SVT, longest lasting 17 beats at 91 bpm.  Rare PVCs, brief idioventricular rhythm noted.  No wide-complex tachycardia 2.5% PVC burden.  ??  TTE 3/17: Normal EF, LVH, grade 1 diastolic dysfunction  ?? Ziopatch 2/16: Rare SVE and VE. 29 episodes of SVT, longest episode lasting 20 beats. No atrial fibrillation identified.  ?? TTE from 06/30/13: Normal EF 55-60%, mildly dilated RV, mild RV dysfunction.  ?? Nuclear stress test 07/2011: ?? Normal myocardial perfusion study  ?? No evidence for significant ischemia or scar EF 62% and RV is mildly dilated.  ?? Minimal coronary calcification is noted involving the LAD and RCA    ______________________________________________________________________    Other past medical history, social history, family history, medications, allergies and problem list reviewed in the medical record.    Current cardiac medications include:   ?? Metoprolol 100 mg twice daily  ?? ASA 81 mg daily  ?? Furosemide 40 mg once daily with additional dose as needed  ?? Warfarin  ?? Pravastatin 20 mg daily      Objective:     BP 126/76  - Pulse 78  - Temp 36.3 ??C (97.3 ??F)  - Ht 185.4 cm (6' 1)  - Wt (!) 116.6 kg (257 lb)  - SpO2 97%  - BMI 33.91 kg/m??     PHYSICAL EXAMINATION:   GENERAL:  Alert, NAD  ENT: Wearing a mask  CARDIOVASCULAR:  Regular rate and rhythm, normal S1/S2, no murmurs, rubs, or gallops. Tr-1+ b/l LE edema.  RESPIRATORY:  Clear to auscultation bilaterally.  No wheezes, crackles, or rhonchi. Normal work of breathing.        ______________________________________________________________________  EKG today shows normal sinus rhythm, left anterior fascicular block, first-degree AV block, nonspecific T wave changes    Recent CV pertinent labs:  Lab Results   Component Value Date    LDL Calculated 57 (L) 04/13/2019    LDL Calculated 74 04/21/2015    Non-HDL Cholesterol 90 04/13/2019    HDL 35 (L) 04/13/2019    HDL 36 (L) 04/21/2015    INR, POC 3.09 (A) 05/19/2019    INR 1.9 (A) 06/02/2019    PRO-BNP 375 (H) 06/29/2014    Creatinine 1.40 (H) 05/19/2019    Creatinine 1.7 (H) 07/09/2017    Creatinine 1.36 08/09/2014    Potassium 4.3 05/19/2019    Potassium 4.6 07/09/2017    BUN 28 (H) 05/19/2019    BUN 49.00 (H) 07/09/2017    TSH 2.202 07/02/2017    TSH 1.20 07/02/2011       Chemistry        Component Value Date/Time    NA 141 05/19/2019 1259    NA 139 07/09/2017 1358    K 4.3 05/19/2019 1259    K 4.6 07/09/2017 1358 CL 101 05/19/2019 1259    CL 104 07/09/2017 1358    CO2 28.0 05/19/2019 1259    CO2 24.2 07/09/2017 1358    BUN 28 (H) 05/19/2019 1259    BUN 49.00 (H) 07/09/2017 1358    CREATININE 1.40 (H) 05/19/2019 1259    CREATININE 1.7 (H) 07/09/2017 1358    GLU 185 (H) 05/19/2019 1259        Component Value Date/Time    CALCIUM 9.3 05/19/2019 1259    CALCIUM 8.9 07/09/2017 1358    ALKPHOS 107 05/19/2019 1259    ALKPHOS 91 04/21/2015 0858    AST 29 05/19/2019 1259    AST 24 04/21/2015 0858    ALT 19 05/19/2019 1259    ALT 22 04/21/2015 0858    BILITOT 0.5 05/19/2019 1259    BILITOT 0.5 04/21/2015 9518

## 2019-06-15 ENCOUNTER — Ambulatory Visit: Admit: 2019-06-15 | Discharge: 2019-06-16 | Payer: MEDICARE

## 2019-06-15 DIAGNOSIS — I4891 Unspecified atrial fibrillation: Principal | ICD-10-CM

## 2019-06-15 DIAGNOSIS — T8610 Unspecified complication of kidney transplant: Principal | ICD-10-CM

## 2019-06-15 DIAGNOSIS — R791 Abnormal coagulation profile: Principal | ICD-10-CM

## 2019-06-15 LAB — CBC W/ AUTO DIFF
BASOPHILS ABSOLUTE COUNT: 0 10*9/L (ref 0.0–0.1)
BASOPHILS RELATIVE PERCENT: 0.6 %
EOSINOPHILS ABSOLUTE COUNT: 0.1 10*9/L (ref 0.0–0.4)
EOSINOPHILS RELATIVE PERCENT: 1.7 %
HEMATOCRIT: 52.3 % (ref 41.0–53.0)
HEMOGLOBIN: 16.6 g/dL (ref 13.5–17.5)
LARGE UNSTAINED CELLS: 1 % (ref 0–4)
LYMPHOCYTES RELATIVE PERCENT: 13.7 %
MEAN CORPUSCULAR HEMOGLOBIN CONC: 31.8 g/dL (ref 31.0–37.0)
MEAN CORPUSCULAR HEMOGLOBIN: 27 pg (ref 26.0–34.0)
MEAN PLATELET VOLUME: 8.3 fL (ref 7.0–10.0)
MONOCYTES ABSOLUTE COUNT: 0.5 10*9/L (ref 0.2–0.8)
MONOCYTES RELATIVE PERCENT: 6.7 %
NEUTROPHILS ABSOLUTE COUNT: 5.7 10*9/L (ref 2.0–7.5)
PLATELET COUNT: 208 10*9/L (ref 150–440)
RED BLOOD CELL COUNT: 6.16 10*12/L — ABNORMAL HIGH (ref 4.50–5.90)
RED CELL DISTRIBUTION WIDTH: 14.5 % (ref 12.0–15.0)
WBC ADJUSTED: 7.5 10*9/L (ref 4.5–11.0)

## 2019-06-15 LAB — COMPREHENSIVE METABOLIC PANEL
ALBUMIN: 3.7 g/dL (ref 3.5–5.0)
ALKALINE PHOSPHATASE: 111 U/L (ref 38–126)
ALT (SGPT): 18 U/L (ref <50)
ANION GAP: 6 mmol/L — ABNORMAL LOW (ref 7–15)
BILIRUBIN TOTAL: 0.8 mg/dL (ref 0.0–1.2)
BLOOD UREA NITROGEN: 29 mg/dL — ABNORMAL HIGH (ref 7–21)
BUN / CREAT RATIO: 21
CALCIUM: 9.4 mg/dL (ref 8.5–10.2)
CHLORIDE: 98 mmol/L (ref 98–107)
CO2: 33 mmol/L — ABNORMAL HIGH (ref 22.0–30.0)
CREATININE: 1.37 mg/dL — ABNORMAL HIGH (ref 0.70–1.30)
EGFR CKD-EPI AA MALE: 60 mL/min/{1.73_m2} (ref >=60)
EGFR CKD-EPI NON-AA MALE: 52 mL/min/{1.73_m2} — ABNORMAL LOW (ref >=60)
GLUCOSE RANDOM: 205 mg/dL — ABNORMAL HIGH (ref 70–179)
POTASSIUM: 6.1 mmol/L (ref 3.5–5.0)
PROTEIN TOTAL: 6.7 g/dL (ref 6.5–8.3)
SODIUM: 137 mmol/L (ref 135–145)

## 2019-06-15 LAB — LYMPHOCYTES RELATIVE PERCENT: Lymphocytes/100 leukocytes:NFr:Pt:Bld:Qn:Automated count: 13.7

## 2019-06-15 LAB — TACROLIMUS BLOOD: Lab: 8.2

## 2019-06-15 LAB — TACROLIMUS LEVEL, TIMED: TACROLIMUS BLOOD: 8.2 ng/mL

## 2019-06-15 LAB — INR: Coagulation tissue factor induced.INR:RelTime:Pt:PPP:Qn:Coag: 2.27

## 2019-06-15 LAB — EGFR CKD-EPI AA MALE: Lab: 60

## 2019-06-16 ENCOUNTER — Ambulatory Visit: Admit: 2019-06-16 | Discharge: 2019-06-17 | Payer: MEDICARE

## 2019-06-16 DIAGNOSIS — T8610 Unspecified complication of kidney transplant: Principal | ICD-10-CM

## 2019-06-16 LAB — BASIC METABOLIC PANEL
ANION GAP: 8 mmol/L (ref 7–15)
BLOOD UREA NITROGEN: 34 mg/dL — ABNORMAL HIGH (ref 7–21)
BUN / CREAT RATIO: 22
CHLORIDE: 98 mmol/L (ref 98–107)
CREATININE: 1.58 mg/dL — ABNORMAL HIGH (ref 0.70–1.30)
EGFR CKD-EPI AA MALE: 50 mL/min/{1.73_m2} — ABNORMAL LOW (ref >=60)
EGFR CKD-EPI NON-AA MALE: 43 mL/min/{1.73_m2} — ABNORMAL LOW (ref >=60)
GLUCOSE RANDOM: 155 mg/dL (ref 70–179)
POTASSIUM: 4.3 mmol/L (ref 3.5–5.0)
SODIUM: 139 mmol/L (ref 135–145)

## 2019-06-16 LAB — CHLORIDE: Chloride:SCnc:Pt:Ser/Plas:Qn:: 98

## 2019-06-16 MED FILL — TACROLIMUS 1 MG CAPSULE: 30 days supply | Qty: 150 | Fill #3 | Status: AC

## 2019-06-16 MED FILL — MYFORTIC 180 MG TABLET,DELAYED RELEASE: 30 days supply | Qty: 180 | Fill #6 | Status: AC

## 2019-06-16 MED FILL — TACROLIMUS 1 MG CAPSULE, IMMEDIATE-RELEASE: 30 days supply | Qty: 150 | Fill #3

## 2019-06-16 MED FILL — MYFORTIC 180 MG TABLET,DELAYED RELEASE: ORAL | 30 days supply | Qty: 180 | Fill #6

## 2019-06-16 NOTE — Unmapped (Signed)
-----   Message from Sharlee Blew, MD sent at 06/15/2019 12:43 PM EST -----  Plase clal and let patient know labs show  1) INR is ok.  Same dose; repeat INR one month.2  2) potassium is very very high.  Please ask him to return for repeat BMP which I have ordered (today or tomorrow)>  Please ensure he is not eating too many bananas or citrus fruits.    [name], M.D.     [facility] Internal Medicine at [facility]   [address]  [address]  [place], [place]  [address]  [phone]

## 2019-06-16 NOTE — Unmapped (Signed)
Patient coming in today at 3:30 for repeat lab.

## 2019-06-17 NOTE — Unmapped (Signed)
Patient has viewed this result note in his/her [facility] account.

## 2019-06-17 NOTE — Unmapped (Signed)
-----   Message from Sharlee Blew, MD sent at 06/17/2019  8:44 AM EST -----  Please call and let patient know repeat potassium looks good.  Monday's sample possibly hemolyzed, but today's looks good.    Try to avoid potassium in diet (citrus, bananas, avocados, seaweed)  Repeat labs as scheduled prior.    FYI sent to nephrology.    [name], M.D.     [facility] Medicine at [facility]   [address]  [address]  [place], [place]  [address]  [phone]

## 2019-06-27 ENCOUNTER — Ambulatory Visit: Payer: Medicare Other | Attending: Internal Medicine

## 2019-06-27 DIAGNOSIS — Z23 Encounter for immunization: Secondary | ICD-10-CM

## 2019-06-27 NOTE — Progress Notes (Signed)
   Covid-19 Vaccination Clinic  Name:  Larry Bright    MRN: [id number] DOB: [birth year]  06/27/2019  Larry Bright was observed post Covid-19 immunization for 30 minutes based on pre-vaccination screening without incidence. He was provided with Vaccine Information Sheet and instruction to access the V-Safe system.   Larry Bright was instructed to call 911 with any severe reactions post vaccine: Marland Kitchen Difficulty breathing  . Swelling of your face and throat  . A fast heartbeat  . A bad rash all over your body  . Dizziness and weakness    Immunizations Administered    Name Date Dose VIS Date Route   Pfizer COVID-19 Vaccine 06/27/2019 10:57 AM 0.3 mL 05/15/2019 Intramuscular   Manufacturer: [place]   Lot: [id number]   [place]: [address]-1

## 2019-06-30 ENCOUNTER — Ambulatory Visit: Admit: 2019-06-30 | Discharge: 2019-07-01 | Payer: MEDICARE | Attending: "Endocrinology | Primary: "Endocrinology

## 2019-06-30 DIAGNOSIS — Z940 Kidney transplant status: Principal | ICD-10-CM

## 2019-06-30 DIAGNOSIS — E1142 Type 2 diabetes mellitus with diabetic polyneuropathy: Principal | ICD-10-CM

## 2019-06-30 DIAGNOSIS — E160 Drug-induced hypoglycemia without coma: Secondary | ICD-10-CM

## 2019-06-30 DIAGNOSIS — T383X5A Adverse effect of insulin and oral hypoglycemic [antidiabetic] drugs, initial encounter: Secondary | ICD-10-CM

## 2019-06-30 NOTE — Unmapped (Signed)
FreeStyle Libre CGM downloaded, report scanned into media. POC glucose done today. PP 7pm 06/29/19. 306 mg/dL.

## 2019-06-30 NOTE — Unmapped (Addendum)
If blood sugar is less than 70, give 15grams of carbs, which is about 4 ounces of juice. Wait 15 minutes, then recheck blood sugar. If still less than 70, give another 15 grams of carbs or 4 ounces of juice.     Reduce lunch to 8 clicks (45 units).  Continue to titrate down dinner dose as you are doing from 3-6 units (15-30 units).   If you continue to have glucoses bordering on hypoglycemia at bedtime, we may need to reduce lunch dose further.     Maintain 1.5mg  Trulicity with suppressed appetite.     Continue to visually inspect feet daily. Do not go barefoot. Avoid warming feet by fire/heater/water without testing first.     I will document to continue CGM with Medicare Coverage.   Reviewed Freestyle Libre 2, with desire to remain with 1.

## 2019-06-30 NOTE — Unmapped (Signed)
ASSESSMENT / PLAN  1. Diabetic polyneuropathy associated with type 2 diabetes mellitus (CMS-HCC)  Excellent glucose control with combination of CGM, GLP-1a and u500 insulin.   Due to increased risk midnight hypoglycemia (likely from stacking of lunch/dinner doses):  Reduce lunch to 8 clicks (45 units).  Continue to titrate down dinner dose as you are doing from 3-6 units (15-30 units).   If you continue to have glucoses bordering on hypoglycemia at bedtime, we may need to reduce lunch/dinner dose further.   Reviewed that Trulicity can be increased further, but with suppressed appetite, maintain 1.5mg  weekly.  Continue to visually inspect feet daily. Do not go barefoot. Avoid warming feet by fire/heater/water without testing first.   [facility] Libre 2, but prefers to maintain 1 for now without alerts.    2. Hypoglycemia due to insulin  Regarding risk hypoglycemia:  If blood sugar is less than 70, give 15grams of carbs, which is about 4 ounces of juice. Wait 15 minutes, then recheck blood sugar. If still less than 70, give another 15 grams of carbs or 4 ounces of juice.   Reviewed that CGM readings can lag behind blood glucose by 10 min, and if any question, check POC glucose.    3. History of kidney transplant  Noted microalbuminuria in 05/2019.   If progressive proteinuria, could consider low dose SGLT2i, but would avoid invokana with risk LE wounds/hx amputation.  Otherwise, does not need for glucose control.    Would also be concerned with further increase in medication burden and costs.   Thoughts shared with Dr. Chinita Pester for Nephrology input as I do not know of data of SGLT2i in post transplant population.    MEDICARE:   *The patient requires a therapeutic CGM.   *The patient has diabetes mellitus and is insulin-treated with 3 or more daily injections of insulin(MDII).   *The patient has been using CGM and is checking glucose 4 or more times per day x 30 days.  *The patient???s insulin treatment regimen requires frequent adjustments by the patient on the basis of therapeutic CGM testing results.   *Within the last six months prior to ordering the CGM, I had an in-person visit with the beneficiary to evaluate their diabetes control and determine that the above criteria are met.   *Every six months following the initial prescription of the CGM, I will have an in-person visit with the beneficiary to assess adherence to their CGM regimen and diabetes treatment plan.      Return in about 6 months (around 12/28/2019).     I personally spent 40 minutes face-to-face and non-face-to-face in the care of this patient, which includes all pre, intra, and post visit time on the date of service.      SUBJECTIVE  PCP: [name]  Tplant: Amy Mottle  Optho:  Herma Ard  Wound: Keagy  Podiatry: Dr. Ether Griffins, [facility]   Derm: Davy Pique, [facility]    HPI: David Ellis is a 74 y.o. male who I last saw 12/2018 returns for follow up of T2DM with hx of kidney transplant.  Is losing weight (270 --> mid 250 lbs) he attributes to less appetite on Trulicity.     Using Freestyle Libre CGM continuosly (from [place]); uses trending arrows to direct intake.  Maintains trulicity 1.5mg  weekly with no GI SE; his only concern is cost ($35). He is aware of reduced appetite on med.  Maintains u500 insulin.  Last visit due to improved glucoses we reduced insulin:  Bfast   19 clicks (95units), lunch 9 clicks (45units), and dinner he has reduced from 3-6 clicks (15-30units).   If glucose is < 80 before the meal, reduces dose by 50%.   He has followed up with Derm: now injecting insulin into leg with abdominal area healing.     Occ nocturnal hypoglycemia at bedtime 11pm-Midnight with CGM readings in 40s and changes in vision.   They can't retrospectively figure out why -- no change in amount or type of dinner or insulin or physical activity.        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   CKD followed by Nephrology.   Peripheral neuropathy w hx of amputation.  On Neurontin. Has recently obtained new DM shoes.   BP tightly controlled.  Lipids on low dose pravachol.    ASA only prn on chonic coumadin.    Endocrine Obtained / Updated Problem List:   1. T2DM dx 1992, insulin roughly 2000:   ?? Proliferative DR s/p hx vitreous hemorrhage and endolaser therapy.  ?? CKD s/p renal transplant  ?? 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 No monofilament 06/2019  ?? Hypoglycemic emergency requiring EMS 03/2011.   ?? Historically, Victoza, Ozempic, Trulicity were not covered or too $$.   ?? Offered to check cpeptide to see if he would qualify for insulin pump under medcare, though he is not interested.    ?? Off MTF since end of 2015/early 2016.   2. 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   3. HTN   4. Nephrolithiasis   5. OSA on cpap   6. Aflutter s/p ablation 06/2011, atrial fibrillation  7. PE    Patient Active Problem List   Diagnosis   ??? Type II diabetes mellitus with renal manifestations, uncontrolled (CMS-HCC)   ??? Hypertension   ??? History of kidney transplant   ??? Mixed hyperlipidemia   ??? 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 Transplanted kidney   ??? Pulmonary embolism (CMS-HCC)   ??? Edema   ??? Right ventricular dysfunction   ??? Atrial fibrillation (CMS-HCC)   ??? Aftercare following organ transplant   ??? Diverticulosis of colon   ??? Routine general medical examination at a health care facility   ??? Diabetic polyneuropathy associated with type 2 diabetes mellitus (CMS-HCC)   ??? Diabetic ulcer of left foot associated with type 2 diabetes mellitus (CMS-HCC)   ??? Toe amputation status   ??? On statin therapy due to risk of future cardiovascular event   ??? Left knee pain   ??? BCC (basal cell carcinoma), face   ??? Acquired absence of other toe(s), unspecified side (CMS-HCC)   ??? History of colonic polyps   ??? 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 Hypoglycemia associated with type 2 diabetes mellitus (CMS-HCC)   ??? PAD (peripheral artery disease) (CMS-HCC)   ??? Class 2 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   ??? Immunosuppressed status (CMS-HCC)   ??? Syncope     Past Medical History:   Diagnosis Date   ??? Atrial flutter (CMS-HCC)    ??? Diabetes mellitus (CMS-HCC)    ??? Diabetic nephropathy (CMS-HCC)    ??? Diabetic retinopathy (CMS-HCC)    ??? Fractures    ??? Ganglion cyst    ??? Hand injury    ??? Heart disease    ??? Hyperlipidemia    ??? Hypertension    ???  Joint pain    ??? Osteomyelitis (CMS-HCC) June 2016   ??? Pulmonary embolism (CMS-HCC) Dec 2015   ??? 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 Stroke (CMS-HCC)    ??? Tear of meniscus of knee    ??? Transplanted kidney 08/21/2011     Medicines:     Current Outpatient Medications   Medication Sig Dispense Refill   ??? aspirin (ECOTRIN) 81 MG tablet Take 81 mg by mouth continuous as needed. Only when flying and/or traveling by car more than 2 hours.     ??? blood-glucose meter kit Use as instructed. 1 each 0   ??? cholecalciferol, vitamin D3, (VITAMIN D3) 2,000 unit Tab Take 2,000 Units by mouth daily.      ??? furosemide (LASIX) 40 MG tablet TAKE 1 TO 2 TABLETS ONCE A DAY 180 each 3   ??? gabapentin (NEURONTIN) 300 MG capsule Take 1 capsule (300 mg total) by mouth Two (2) times a day. 180 each 3   ??? HUMULIN R U-500, CONC, KWIKPEN 500 unit/mL (3 mL) CONCENTRATED injection DIAL TO MAX 100 UNITS BEFORE BREAKFAST, LUNCH, AND DINNER.  INJECT BEFOREMEALS MAX OF 300 UNITS DAILY 18 mL 11   ??? hydrocortisone 2.5 % cream   0   ??? ketoconazole (NIZORAL) 2 % cream   0   ??? magnesium chloride (SLOW_MAG) 64 mg TbEC Take 128 mg by mouth three (3) times a day (at 6am, noon and 6pm).      ??? metoprolol succinate (TOPROL-XL) 100 MG 24 hr tablet Take 1 tablet (100 mg total) by mouth two (2) times a day. 180 tablet 3   ??? miscellaneous medical supply Misc 1 application by Miscellaneous route daily as needed. Lightweight wheelchair. 1 each 0   ??? multivitamin capsule Take 1 capsule by mouth daily.     ??? MYFORTIC 180 mg EC tablet Take 3 tablets (540 mg total) by mouth two (2) times a day. 180 tablet 35   ??? pen needle, diabetic (BD ULTRA-FINE NANO PEN NEEDLE) 32 gauge x 5/32 (4 mm) Ndle 1 each by Other route Three (3) times a day before meals. 300 each 3   ??? pravastatin (PRAVACHOL) 20 MG tablet TAKE 1 TABLET BY MOUTH DAILY 90 each 3   ??? tacrolimus (PROGRAF) 1 MG capsule Take 3 capsules (3mg ) by mouth in the morning and 2 capsules (2mg ) in the evening. 450 capsule 3   ??? TRULICITY 1.5 mg/0.5 mL PnIj INJECT 1.5MG (0.5ML) SUBCUTANEOUSLY ONCE A WEEK 2 mL 5   ??? warfarin (COUMADIN) 2.5 MG tablet TAKE ONE TABLET BY MOUTH EVERY MONDAY ORAS DIRECTED 30 each 12   ??? warfarin (COUMADIN) 5 MG tablet 5 days a week 30 each 12     No current facility-administered medications for this visit.      Facility-Administered Medications Ordered in Other Visits   Medication Dose Route Frequency Provider Last Rate Last Admin   ??? bevacizumab    PRN (once a day) Melvyn Novas, MD   2.5 mg at 01/28/17 1610       Social History: reviewed: his wife was kidney donor. rare etoh. no cig. 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     Other past medical history, medications, allergies and problem list are reviewed in the medical record    REVIEW OF SYSTEMS: Pertinent positives and negatives as in HPI, otherwise, all other systems reviewed are negative.     OBJECTIVE  Vitals:    06/30/19 0811   BP: 149/82   Pulse: 85   Temp: 36.9 ??C (98.4 ??F)   TempSrc: Oral   Weight: (!) 116.4 kg (256 lb 11.2 oz)   Height: 185.4 cm (6' 0.99)     Body mass index is 33.87 kg/m??.    BP Readings from Last 3 Encounters:   06/30/19 149/82   06/09/19 126/76   05/19/19 133/76     Wt Readings from Last 3 Encounters:   06/30/19 (!) 116.4 kg (256 lb 11.2 oz)   06/09/19 (!) 116.6 kg (257 lb)   05/19/19 (!) 116.6 kg (257 lb)     GENERAL: well appearing  PSYCH: calm, cooperative with full affect  EYES: eomi, sclera anicteric  CV: sinus rate, regular rhythm, DP faint +1 bilat  RESP: breathing nonlabored, lungs CTA throughout  SKIN: LE no pitting edema, skin dry, flakey but no deep fissures/ulcereations, s/p L 4/5 toe amputation with skin intact  Neuro: no monofilemant detected    Pertinent labs:  Office Visit on 06/30/2019   Component Date Value Ref Range Status   ??? Glucose, POC 06/30/2019 306* 70 - 179 mg/dL Final   Appointment on 06/16/2019   Component Date Value Ref Range Status   ??? Sodium 06/16/2019 139  135 - 145 mmol/L Final   ??? Potassium 06/16/2019 4.3  3.5 - 5.0 mmol/L Final   ??? Chloride 06/16/2019 98  98 - 107 mmol/L Final   ??? CO2 06/16/2019 33.0* 22.0 - 30.0 mmol/L Final   ??? Anion Gap 06/16/2019 8  7 - 15 mmol/L Final   ??? BUN 06/16/2019 34* 7 - 21 mg/dL Final   ??? Creatinine 06/16/2019 1.58* 0.70 - 1.30 mg/dL Final   ??? BUN/Creatinine Ratio 06/16/2019 22   Final   ??? EGFR CKD-EPI Non-African American,* 06/16/2019 43* >=60 mL/min/1.48m2 Final   ??? EGFR CKD-EPI African American, Male 06/16/2019 50* >=60 mL/min/1.53m2 Final   ??? Glucose 06/16/2019 155  70 - 179 mg/dL Final   ??? Calcium 16/03/9603 9.2  8.5 - 10.2 mg/dL Final   Appointment on 06/15/2019   Component Date Value Ref Range Status   ??? Sodium 06/15/2019 137  135 - 145 mmol/L Final   ??? Potassium 06/15/2019 6.1* 3.5 - 5.0 mmol/L Final   ??? Chloride 06/15/2019 98  98 - 107 mmol/L Final   ??? Anion Gap 06/15/2019 6* 7 - 15 mmol/L Final   ??? CO2 06/15/2019 33.0* 22.0 - 30.0 mmol/L Final   ??? BUN 06/15/2019 29* 7 - 21 mg/dL Final   ??? Creatinine 06/15/2019 1.37* 0.70 - 1.30 mg/dL Final   ??? BUN/Creatinine Ratio 06/15/2019 21   Final   ??? EGFR CKD-EPI Non-African American,* 06/15/2019 52* >=60 mL/min/1.69m2 Final   ??? EGFR CKD-EPI African American, Male 06/15/2019 60  >=60 mL/min/1.17m2 Final   ??? Glucose 06/15/2019 205* 70 - 179 mg/dL Final   ??? Calcium 54/02/8118 9.4  8.5 - 10.2 mg/dL Final   ??? Albumin 14/78/2956 3.7  3.5 - 5.0 g/dL Final   ??? Total Protein 06/15/2019 6.7  6.5 - 8.3 g/dL Final   ??? Total Bilirubin 06/15/2019 0.8  0.0 - 1.2 mg/dL Final   ??? AST 21/30/8657 28  19 - 55 U/L Final   ??? ALT 06/15/2019 18  <50 U/L Final   ??? Alkaline Phosphatase 06/15/2019 111  38 - 126 U/L Final   ??? PT 06/15/2019 26.0* 10.5 - 13.5 sec Final   ??? INR 06/15/2019 2.27   Final   ???  Tacrolimus, Timed 06/15/2019 8.2  ng/mL Final   ??? WBC 06/15/2019 7.5  4.5 - 11.0 10*9/L Final   ??? RBC 06/15/2019 6.16* 4.50 - 5.90 10*12/L Final   ??? HGB 06/15/2019 16.6  13.5 - 17.5 g/dL Final   ??? HCT 11/91/4782 52.3  41.0 - 53.0 % Final   ??? MCV 06/15/2019 84.9  80.0 - 100.0 fL Final   ??? MCH 06/15/2019 27.0  26.0 - 34.0 pg Final   ??? MCHC 06/15/2019 31.8  31.0 - 37.0 g/dL Final   ??? RDW 95/62/1308 14.5  12.0 - 15.0 % Final   ??? MPV 06/15/2019 8.3  7.0 - 10.0 fL Final   ??? Platelet 06/15/2019 208  150 - 440 10*9/L Final   ??? Neutrophils % 06/15/2019 75.9  % Final   ??? Lymphocytes % 06/15/2019 13.7  % Final   ??? Monocytes % 06/15/2019 6.7  % Final   ??? Eosinophils % 06/15/2019 1.7  % Final   ??? Basophils % 06/15/2019 0.6  % Final   ??? Absolute Neutrophils 06/15/2019 5.7  2.0 - 7.5 10*9/L Final   ??? Absolute Lymphocytes 06/15/2019 1.0* 1.5 - 5.0 10*9/L Final   ??? Absolute Monocytes 06/15/2019 0.5  0.2 - 0.8 10*9/L Final   ??? Absolute Eosinophils 06/15/2019 0.1  0.0 - 0.4 10*9/L Final   ??? Absolute Basophils 06/15/2019 0.0  0.0 - 0.1 10*9/L Final   ??? Large Unstained Cells 06/15/2019 1  0 - 4 % Final   Anticoag visit on 06/02/2019   Component Date Value Ref Range Status   ??? INR 06/02/2019 1.9* 2 - 3 Final     Lab Results   Component Value Date    Hemoglobin A1C 6.5 (H) 04/13/2019    Hemoglobin A1C 8.1 (H) 01/07/2019    Hemoglobin A1C 8.3 (H) 08/20/2018    Hemoglobin A1C 8.1 (H) 07/24/2018    Hemoglobin A1C 6.7 (H) 08/04/2014    Hemoglobin A1C 7.5 07/20/2014    Hemoglobin A1C 7.1 (H) 07/04/2014    Hemoglobin A1C 8.0 (H) 11/23/2013    HB A1C, RAP/HGATE 6.7 (H) 04/07/2012    HB A1C, RAP/HGATE 7.4 (H) 03/30/2011    HGB A1C, POC 9.5 (H) 11/18/2018 HGB A1C, POC 9.3 (H) 04/24/2018    HGB A1C, POC 8.0 (H) 10/03/2015    Hemoglobin A1c 7.6 (H) 05/17/2014     No components found for: SODIUM  Lab Results   Component Value Date    K 4.3 06/16/2019     Lab Results   Component Value Date    CREATININE 1.58 (H) 06/16/2019     Lab Results   Component Value Date    PTH 192.3 (H) 11/27/2017    CALCIUM 9.2 06/16/2019     Lab Results   Component Value Date    ALT 18 06/15/2019    ALT 22 04/21/2015     Lab Results   Component Value Date    CHOL 125 04/13/2019     Lab Results   Component Value Date    LDL 57 (L) 04/13/2019     Lab Results   Component Value Date    HDL 35 (L) 04/13/2019     Lab Results   Component Value Date    TRIG 166 (H) 04/13/2019     Lab Results   Component Value Date    TSH 2.202 07/02/2017    TSH 1.840 07/13/2015    TSH 1.420 07/09/2015     Lab Results   Component Value  Date    TSH 2.202 07/02/2017     No results found for: CBC  No results found for: VITAMINB12  Lab Results   Component Value Date    VITD2 <5 08/17/2015    VITD3 42 08/17/2015    VITDTOTAL 33.8 03/01/2017     No results found for: Christa See, MSHCGMOM, MALBCRERAT  Lab Results   Component Value Date    Protein, Ur 5.1 03/27/2018    Protein, Ur 15 08/05/2014    Protein/Creatinine Ratio, Urine 0.037 03/27/2018    Protein/Creatinine Ratio, Urine 0.116 08/05/2014       IMAGING:

## 2019-07-03 NOTE — Unmapped (Signed)
Most recent CN and CGM data faxed to [facility].

## 2019-07-03 NOTE — Unmapped (Signed)
[facility] Refill Coordination Note    Specialty Medication(s) to be Shipped:   Transplant: Myfortic 180mg  and tacrolimus 1mg     Other medication(s) to be shipped: N/A     David Ellis, DOB: 11/18/1945  Phone: [phone] (home)       All above HIPAA information was verified with patient.     Was a Nurse, learning disability used for this call? No    Completed refill call assessment today to schedule patient's medication shipment from the [facility] [phone]).       Specialty medication(s) and dose(s) confirmed: Regimen is correct and unchanged.   Changes to medications: [name] reports no changes at this time.  Changes to insurance: No  Questions for the pharmacist: No    Confirmed patient received Welcome Packet with first shipment. The patient will receive a drug information handout for each medication shipped and additional FDA Medication Guides as required.       DISEASE/MEDICATION-SPECIFIC INFORMATION        N/A    SPECIALTY MEDICATION ADHERENCE     Medication Adherence    Patient reported X missed doses in the last month: 1  Specialty Medication:  Myfortic 180mg   Patient is on additional specialty medications: Yes  Additional Specialty Medications: Tacrolimus 1mg   Patient Reported Additional Medication X Missed Doses in the Last Month: 1  Patient is on more than two specialty medications: No  Adherence tools used: patient uses a pill box to manage medications  Support network for adherence: family member          Myfortic 180 mg: 14 days of medicine on hand   Tacrolimus 1 mg: 14 days of medicine on hand     SHIPPING     Shipping address confirmed in Epic.     Delivery Scheduled: Yes, Expected medication delivery date: 07/15/2019.     Medication will be delivered via Next Day Courier to the prescription address in Epic WAM.    David Ellis   [facility] Specialty Technician

## 2019-07-14 MED FILL — MYFORTIC 180 MG TABLET,DELAYED RELEASE: ORAL | 30 days supply | Qty: 180 | Fill #7

## 2019-07-14 MED FILL — MYFORTIC 180 MG TABLET,DELAYED RELEASE: 30 days supply | Qty: 180 | Fill #7 | Status: AC

## 2019-07-14 MED FILL — TACROLIMUS 1 MG CAPSULE, IMMEDIATE-RELEASE: 30 days supply | Qty: 150 | Fill #4

## 2019-07-14 MED FILL — TACROLIMUS 1 MG CAPSULE: 30 days supply | Qty: 150 | Fill #4 | Status: AC

## 2019-07-16 ENCOUNTER — Ambulatory Visit: Admit: 2019-07-16 | Discharge: 2019-07-16 | Payer: MEDICARE

## 2019-07-16 DIAGNOSIS — H35373 Puckering of macula, bilateral: Principal | ICD-10-CM

## 2019-07-16 DIAGNOSIS — H4312 Vitreous hemorrhage, left eye: Principal | ICD-10-CM

## 2019-07-16 DIAGNOSIS — Z961 Presence of intraocular lens: Principal | ICD-10-CM

## 2019-07-16 DIAGNOSIS — E113593 Type 2 diabetes mellitus with proliferative diabetic retinopathy without macular edema, bilateral: Principal | ICD-10-CM

## 2019-07-16 LAB — CBC W/ AUTO DIFF
BASOPHILS RELATIVE PERCENT: 0.5 %
EOSINOPHILS ABSOLUTE COUNT: 0.1 10*9/L (ref 0.0–0.4)
EOSINOPHILS RELATIVE PERCENT: 1.6 %
HEMATOCRIT: 47.3 % (ref 41.0–53.0)
HEMOGLOBIN: 15.6 g/dL (ref 13.5–17.5)
LARGE UNSTAINED CELLS: 1 % (ref 0–4)
LYMPHOCYTES ABSOLUTE COUNT: 1 10*9/L — ABNORMAL LOW (ref 1.5–5.0)
MEAN CORPUSCULAR HEMOGLOBIN CONC: 32.9 g/dL (ref 31.0–37.0)
MEAN CORPUSCULAR HEMOGLOBIN: 28 pg (ref 26.0–34.0)
MEAN CORPUSCULAR VOLUME: 85.1 fL (ref 80.0–100.0)
MEAN PLATELET VOLUME: 10 fL (ref 7.0–10.0)
MONOCYTES ABSOLUTE COUNT: 0.5 10*9/L (ref 0.2–0.8)
NEUTROPHILS ABSOLUTE COUNT: 6.2 10*9/L (ref 2.0–7.5)
NEUTROPHILS RELATIVE PERCENT: 78 %
PLATELET COUNT: 200 10*9/L (ref 150–440)
RED CELL DISTRIBUTION WIDTH: 14.8 % (ref 12.0–15.0)
WBC ADJUSTED: 7.9 10*9/L (ref 4.5–11.0)

## 2019-07-16 LAB — COMPREHENSIVE METABOLIC PANEL
ALBUMIN: 3.4 g/dL — ABNORMAL LOW (ref 3.5–5.0)
ALKALINE PHOSPHATASE: 105 U/L (ref 38–126)
ALT (SGPT): 19 U/L (ref <50)
ANION GAP: 5 mmol/L — ABNORMAL LOW (ref 7–15)
AST (SGOT): 28 U/L (ref 19–55)
BILIRUBIN TOTAL: 0.7 mg/dL (ref 0.0–1.2)
BLOOD UREA NITROGEN: 27 mg/dL — ABNORMAL HIGH (ref 7–21)
BUN / CREAT RATIO: 20
CHLORIDE: 102 mmol/L (ref 98–107)
CO2: 29 mmol/L (ref 22.0–30.0)
CREATININE: 1.33 mg/dL — ABNORMAL HIGH (ref 0.70–1.30)
EGFR CKD-EPI AA MALE: 62 mL/min/{1.73_m2} (ref >=60)
EGFR CKD-EPI NON-AA MALE: 53 mL/min/{1.73_m2} — ABNORMAL LOW (ref >=60)
GLUCOSE RANDOM: 182 mg/dL — ABNORMAL HIGH (ref 70–179)
POTASSIUM: 4.5 mmol/L (ref 3.5–5.0)
SODIUM: 136 mmol/L (ref 135–145)

## 2019-07-16 LAB — POTASSIUM: Potassium:SCnc:Pt:Ser/Plas:Qn:: 4.5

## 2019-07-16 LAB — PROTIME: Coagulation tissue factor induced:Time:Pt:PPP:Qn:Coag: 27.9 — ABNORMAL HIGH

## 2019-07-16 LAB — PLATELET COUNT: Platelets:NCnc:Pt:Bld:Qn:Automated count: 200

## 2019-07-16 LAB — TACROLIMUS BLOOD: Lab: 4.7

## 2019-07-16 NOTE — Unmapped (Signed)
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  I reviewed the resident???s note.  I agree with the resident???s findings and plan including interpretation of images.     EXTENDED OPHTHALMOSCOPY RESULTS:  OD: MAs, PRP 360  OS: MAs, PRP 360

## 2019-07-17 ENCOUNTER — Ambulatory Visit: Payer: Medicare Other

## 2019-07-17 ENCOUNTER — Telehealth: Payer: Self-pay

## 2019-07-17 DIAGNOSIS — I4891 Unspecified atrial fibrillation: Principal | ICD-10-CM

## 2019-07-17 NOTE — Telephone Encounter (Signed)
Cologuard recall letter has been mailed to patient's home address on file.

## 2019-07-17 NOTE — Unmapped (Signed)
Patient notified and voiced understanding.

## 2019-07-17 NOTE — Unmapped (Signed)
-----   Message from Sharlee Blew, MD sent at 07/16/2019  1:44 PM EST -----  Call and let patient know labs stable.    INR ok.  Same dose; repeat INR One month.    [name], M.D.     [facility] Internal Medicine at [facility]   [address]  [address]  [place], [place]  [address]  [phone]

## 2019-07-20 ENCOUNTER — Ambulatory Visit: Payer: Medicare Other | Attending: Internal Medicine

## 2019-07-20 DIAGNOSIS — Z23 Encounter for immunization: Secondary | ICD-10-CM

## 2019-07-20 NOTE — Progress Notes (Signed)
   Covid-19 Vaccination Clinic  Name:  Larry Bright    MRN: [id number] DOB: [birth year]  07/20/2019  Larry Bright was observed post Covid-19 immunization for 15 minutes without incidence. He was provided with Vaccine Information Sheet and instruction to access the V-Safe system.   Larry Bright was instructed to call 911 with any severe reactions post vaccine: Marland Kitchen Difficulty breathing  . Swelling of your face and throat  . A fast heartbeat  . A bad rash all over your body  . Dizziness and weakness    Immunizations Administered    Name Date Dose VIS Date Route   Pfizer COVID-19 Vaccine 07/20/2019 11:04 AM 0.3 mL 05/15/2019 Intramuscular   Manufacturer: [place]   Lot: [id number]   [place]: [address]-3

## 2019-07-31 NOTE — Unmapped (Signed)
Printed chart note(s) requested by [name]. Submitted to be faxed.

## 2019-08-03 NOTE — Unmapped (Signed)
FAXED REQUESTED RECORDS TO [facility] MEDICAL SUPPLIES

## 2019-08-04 NOTE — Unmapped (Addendum)
[facility] Refill Coordination Note    Specialty Medication(s) to be Shipped:   Transplant: Myfortic 180mg  and tacrolimus 1mg     Other medication(s) to be shipped: none     David Ellis, DOB: 1946/03/24  Phone: [phone] (home)       All above HIPAA information was verified with patient.     Was a Nurse, learning disability used for this call? No    Completed refill call assessment today to schedule patient's medication shipment from the [facility] [phone]).       Specialty medication(s) and dose(s) confirmed: Regimen is correct and unchanged.   Changes to medications: Tarik reports no changes at this time.  Changes to insurance: No  Questions for the pharmacist: No    Confirmed patient received Welcome Packet with first shipment. The patient will receive a drug information handout for each medication shipped and additional FDA Medication Guides as required.       DISEASE/MEDICATION-SPECIFIC INFORMATION        N/A    SPECIALTY MEDICATION ADHERENCE     Medication Adherence    Patient reported X missed doses in the last month: 0  Specialty Medication: Myfortic 180mg   Patient is on additional specialty medications: Yes  Additional Specialty Medications: Tacrolimus 1mg   Patient Reported Additional Medication X Missed Doses in the Last Month: 0  Patient is on more than two specialty medications: No  Adherence tools used: patient uses a pill box to manage medications  Support network for adherence: family member                Myfortic 180 mg: 13 days of medicine on hand   Tacrolimus 1 mg: 13 days of medicine on hand *    SHIPPING     Shipping address confirmed in Epic.     Delivery Scheduled: Yes, Expected medication delivery date: 08/14/19.     Medication will be delivered via Same Day Courier to the prescription address in Epic WAM.    [name]   [facility] Specialty Pharmacist

## 2019-08-14 MED FILL — TACROLIMUS 1 MG CAPSULE, IMMEDIATE-RELEASE: 30 days supply | Qty: 150 | Fill #5

## 2019-08-14 MED FILL — TACROLIMUS 1 MG CAPSULE, IMMEDIATE-RELEASE: 30 days supply | Qty: 150 | Fill #5 | Status: AC

## 2019-08-14 MED FILL — MYFORTIC 180 MG TABLET,DELAYED RELEASE: ORAL | 30 days supply | Qty: 180 | Fill #8

## 2019-08-14 MED FILL — MYFORTIC 180 MG TABLET,DELAYED RELEASE: 30 days supply | Qty: 180 | Fill #8 | Status: AC

## 2019-08-19 DIAGNOSIS — Z940 Kidney transplant status: Principal | ICD-10-CM

## 2019-08-19 DIAGNOSIS — N189 Chronic kidney disease, unspecified: Principal | ICD-10-CM

## 2019-08-19 DIAGNOSIS — Z79899 Other long term (current) drug therapy: Principal | ICD-10-CM

## 2019-08-19 DIAGNOSIS — Z114 Encounter for screening for human immunodeficiency virus [HIV]: Principal | ICD-10-CM

## 2019-08-19 DIAGNOSIS — Z1159 Encounter for screening for other viral diseases: Principal | ICD-10-CM

## 2019-08-19 DIAGNOSIS — D631 Anemia in chronic kidney disease: Principal | ICD-10-CM

## 2019-08-21 ENCOUNTER — Ambulatory Visit: Admit: 2019-08-21 | Discharge: 2019-08-22 | Payer: MEDICARE

## 2019-08-21 ENCOUNTER — Ambulatory Visit: Admit: 2019-08-21 | Discharge: 2019-08-22 | Payer: MEDICARE | Attending: Nephrology | Primary: Nephrology

## 2019-08-21 DIAGNOSIS — T8610 Unspecified complication of kidney transplant: Principal | ICD-10-CM

## 2019-08-21 DIAGNOSIS — Z1159 Encounter for screening for other viral diseases: Principal | ICD-10-CM

## 2019-08-21 DIAGNOSIS — E1165 Type 2 diabetes mellitus with hyperglycemia: Principal | ICD-10-CM

## 2019-08-21 DIAGNOSIS — N189 Chronic kidney disease, unspecified: Secondary | ICD-10-CM

## 2019-08-21 DIAGNOSIS — N2581 Secondary hyperparathyroidism of renal origin: Principal | ICD-10-CM

## 2019-08-21 DIAGNOSIS — Z940 Kidney transplant status: Principal | ICD-10-CM

## 2019-08-21 DIAGNOSIS — D631 Anemia in chronic kidney disease: Principal | ICD-10-CM

## 2019-08-21 DIAGNOSIS — E1129 Type 2 diabetes mellitus with other diabetic kidney complication: Principal | ICD-10-CM

## 2019-08-21 DIAGNOSIS — Z114 Encounter for screening for human immunodeficiency virus [HIV]: Principal | ICD-10-CM

## 2019-08-21 DIAGNOSIS — Z48298 Encounter for aftercare following other organ transplant: Principal | ICD-10-CM

## 2019-08-21 DIAGNOSIS — Z79899 Other long term (current) drug therapy: Principal | ICD-10-CM

## 2019-08-21 DIAGNOSIS — D899 Disorder involving the immune mechanism, unspecified: Principal | ICD-10-CM

## 2019-08-21 DIAGNOSIS — R791 Abnormal coagulation profile: Principal | ICD-10-CM

## 2019-08-21 LAB — URINALYSIS
BILIRUBIN UA: NEGATIVE
GLUCOSE UA: NEGATIVE
KETONES UA: NEGATIVE
LEUKOCYTE ESTERASE UA: NEGATIVE
NITRITE UA: NEGATIVE
PH UA: 5 (ref 5.0–9.0)
PROTEIN UA: 100 — AB
RBC UA: 2 /HPF (ref <3)
SPECIFIC GRAVITY UA: 1.02 (ref 1.005–1.030)
SQUAMOUS EPITHELIAL: 3 /HPF (ref 0–5)
WBC UA: 4 /HPF — ABNORMAL HIGH (ref <2)

## 2019-08-21 LAB — COMPREHENSIVE METABOLIC PANEL
ALBUMIN: 3.7 g/dL (ref 3.4–5.0)
ALKALINE PHOSPHATASE: 111 U/L (ref 46–116)
ALT (SGPT): 19 U/L (ref 10–49)
ANION GAP: 9 mmol/L (ref 3–11)
AST (SGOT): 27 U/L (ref <34)
BILIRUBIN TOTAL: 0.8 mg/dL (ref 0.3–1.2)
BLOOD UREA NITROGEN: 24 mg/dL — ABNORMAL HIGH (ref 9–23)
BUN / CREAT RATIO: 19
CALCIUM: 9.8 mg/dL (ref 8.7–10.4)
CO2: 25.3 mmol/L (ref 20.0–31.0)
CREATININE: 1.29 mg/dL — ABNORMAL HIGH (ref 0.60–1.10)
EGFR CKD-EPI AA MALE: 64 mL/min/{1.73_m2}
EGFR CKD-EPI NON-AA MALE: 55 mL/min/{1.73_m2}
GLUCOSE RANDOM: 252 mg/dL — ABNORMAL HIGH (ref 70–179)
POTASSIUM: 4.1 mmol/L (ref 3.5–5.1)
PROTEIN TOTAL: 6.8 g/dL (ref 5.7–8.2)
SODIUM: 137 mmol/L (ref 135–145)

## 2019-08-21 LAB — IRON & TIBC
IRON SATURATION (CALC): 35 % (ref 20–50)
IRON: 114 ug/dL (ref 35–165)
TOTAL IRON BINDING CAPACITY (CALC): 322.4 mg/dL (ref 252.0–479.0)

## 2019-08-21 LAB — CBC W/ AUTO DIFF
BASOPHILS ABSOLUTE COUNT: 0.1 10*9/L (ref 0.0–0.1)
BASOPHILS RELATIVE PERCENT: 0.8 %
EOSINOPHILS RELATIVE PERCENT: 1.1 %
HEMATOCRIT: 47.6 % (ref 38.0–50.0)
LYMPHOCYTES ABSOLUTE COUNT: 0.9 10*9/L (ref 0.7–4.0)
LYMPHOCYTES RELATIVE PERCENT: 11.4 %
MEAN CORPUSCULAR HEMOGLOBIN CONC: 32.7 g/dL (ref 30.0–36.0)
MEAN CORPUSCULAR HEMOGLOBIN: 27.3 pg (ref 26.0–34.0)
MEAN PLATELET VOLUME: 7.6 fL (ref 7.0–10.0)
MONOCYTES ABSOLUTE COUNT: 0.5 10*9/L (ref 0.1–1.0)
MONOCYTES RELATIVE PERCENT: 6.4 %
NEUTROPHILS ABSOLUTE COUNT: 6.3 10*9/L (ref 1.7–7.7)
NEUTROPHILS RELATIVE PERCENT: 80.3 %
PLATELET COUNT: 218 10*9/L (ref 150–450)
RED CELL DISTRIBUTION WIDTH: 15.1 % — ABNORMAL HIGH (ref 12.0–15.0)
WBC ADJUSTED: 7.8 10*9/L (ref 3.5–10.5)

## 2019-08-21 LAB — ALBUMIN: Albumin:MCnc:Pt:Ser/Plas:Qn:: 3.7

## 2019-08-21 LAB — PROTIME: Coagulation tissue factor induced:Time:Pt:PPP:Qn:Coag: 21.6 — ABNORMAL HIGH

## 2019-08-21 LAB — LIPID PANEL
CHOLESTEROL: 135 mg/dL (ref <=200)
HDL CHOLESTEROL: 32 mg/dL — ABNORMAL LOW (ref 40–60)
LDL CHOLESTEROL CALCULATED: 66 mg/dL (ref 40–100)
TRIGLYCERIDES: 185 mg/dL — ABNORMAL HIGH (ref 0–150)
VLDL CHOLESTEROL CAL: 37 mg/dL (ref 12–42)

## 2019-08-21 LAB — CREATININE, URINE: Lab: 109.8

## 2019-08-21 LAB — PROTEIN / CREATININE RATIO, URINE: PROTEIN/CREAT RATIO, URINE: 0.579

## 2019-08-21 LAB — IRON SATURATION (CALC): Iron saturation:MFr:Pt:Ser/Plas:Qn:: 35

## 2019-08-21 LAB — NON-HDL CHOLESTEROL: Cholesterol.non HDL:MCnc:Pt:Ser/Plas:Qn:: 103

## 2019-08-21 LAB — COLOR

## 2019-08-21 LAB — WBC ADJUSTED: Leukocytes:NCnc:Pt:Bld:Qn:: 7.8

## 2019-08-21 LAB — PHOSPHORUS: Phosphate:MCnc:Pt:Ser/Plas:Qn:: 3.3

## 2019-08-21 LAB — MAGNESIUM: Magnesium:MCnc:Pt:Ser/Plas:Qn:: 1.7

## 2019-08-21 LAB — PROTIME-INR: PROTIME: 21.6 s — ABNORMAL HIGH (ref 10.5–13.5)

## 2019-08-21 LAB — VITAMIN D, TOTAL (25OH): Lab: 30.6

## 2019-08-21 MED ORDER — TRULICITY 1.5 MG/0.5 ML SUBCUTANEOUS PEN INJECTOR
SUBCUTANEOUS | 5 refills | 28.00000 days | Status: CP
Start: 2019-08-21 — End: ?

## 2019-08-21 NOTE — Unmapped (Signed)
Saw patient in clinic. Denies any questions or concerns. Has completed COVID vaccines, no side effects.    Labs today, took prograf at 915pm    Denies HA, CP , Heart arrhthymias, SOB, abdominal pain, n/v/d/c, urinary problems, swelling, tremors, fevers, numbness/tingling    BS has been well controlled per patient    Reports right flank pain into hip that went away

## 2019-08-21 NOTE — Unmapped (Signed)
AOBP   Patient's blood pressure was taken on left upper arm, large cuff.   1st reading 103/61, pulse: 87  2nd reading 114/57, pulse: 86  3rd reading 100/55, pulse: 87  Average reading 106/58, pulse: 87

## 2019-08-21 NOTE — Unmapped (Signed)
Transplant Nephrology Telephone Visit    Assessment and Plan    David Ellis is a 74 y.o. male s/p LD kidney transplant 08/21/2011. Active medical issues include:    1.  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 Serum creatinine is 1.29 mg/dL (baseline 9.8-1.1 mg/dL).  Past transplant biopsies revealed moderate to severe arteriosclerosis. He has a remote history of a HLA-DQ 7 donor specific antibody (noted on 09/17/2011) with all subsequent HLA antibody screens revealing no DSA's. UP/C today is 0.579 which represents a change from previously normal values. Diabetic nephropathy is a leading possibility. Will recheck DSA screen today and plan to repeat UP/C before considering a biopsy.      2.  Immunosuppression management.  He is tolerating his current immunosuppressive regimen well. His tacrolimus target level is 4-7 (pending today). Mycophenolate will be continued at a dosage of 540 mg twice daily and tacrolimus at 3 mg AM/2 mg PM.     3. Hypertension, controlled. His metoprolol dose was reduced due to postural hypotension and symptoms have subsequently improved. BP today is 106/58.     4.  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 His wounds are healed with no new ulcers.     5.  History of  DVT and pulmonary embolus. Patient will continue Coumadin.      6.  Diabetes mellitus, type 2.  Hemoglobin A1c 65. On 04/13/19. He will follow with his endocrinologist and Dr. Arvin Collard.     7.  Atrial fibrillation.  He will continue Coumadin and metoprolol XL.    8. CVA, status post alteplase 07/2015. No residual deficits. On coumadin and statin.    9.  Immunizations. He is up-to-date on pneumococcal vaccines, flu vaccine, shingrix, and completed COVID-19 vaccine x 2.      10.  Hyperparathyroidism and bone and mineral metabolism.  Serum Ca and phos are normal. PTH most recently 192.  Patient does not meet criteria for initiation of Sensipar.    11. Cancer screening. He sees Dermatology every 6 months and was last seen 2 months ago.Renal US today reveals no renal masses.  He is doing intermittent cologaurd testing rather than a colonoscopy due to being on coumadin. His last colonoscopy was 06/2011.      12.  Follow-up.  Patient will continue to follow with his multiple physicians. I will follow up on repeat UP/C testing and DSA screen.  He will be seen in clinic in 1 year unless testing warrants pursuit of a transplant biopsy.     History of Present Illness    Transplant History:  David Ellis is 74 y.o. 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  He has no history of rejection with baseline serum creatinine 1.3-1.8 mg/dL. 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    His posttransplant course was complicated by a pulmonary embolus in December 2014, foot infections necessitating amputation of 2 toes, and a stroke in February 2017.    Interval history:     He presents today for his annual visit. He denies dysuria, allograft tenderness, fever or chills. Peripheral edema is mild. He continues to have unsteady gait related to balance problems due to neuropathy. His metoprolol dose was reduced due to symptomatic postural hypotension and he notes improvement in this symptom since making this change. BP at home has been approximately 120-130/70s.  He denies chest pain, shortness of breath, nausea, vomiting, or diarrhea. He had a Ziopatch study in 02/2019 with no atrial fibrillation noted. Blood glucose control has been excellent with A1C recently 6.5. He denies stroke symptoms.     Review of Systems    All other systems are reviewed and are negative. A 10 systems review is completed.    Medications    Current Outpatient Medications   Medication Sig Dispense Refill   ??? aspirin (ECOTRIN) 81 MG tablet Take 81 mg by mouth continuous as needed. Only when flying and/or traveling by car more than 2 hours.     ??? blood-glucose meter kit Use as instructed. 1 each 0   ??? cholecalciferol, vitamin D3, (VITAMIN D3) 2,000 unit Tab Take 2,000 Units by mouth daily.      ??? dulaglutide (TRULICITY) 1.5 mg/0.5 mL PnIj Inject 1.5mg  (0.40ml) subcutaneously once a week. 2 mL 5   ??? furosemide (LASIX) 40 MG tablet TAKE 1 TO 2 TABLETS ONCE A DAY 180 each 3   ??? gabapentin (NEURONTIN) 300 MG capsule Take 1 capsule (300 mg total) by mouth Two (2) times a day. 180 each 3   ??? HUMULIN R U-500, CONC, KWIKPEN 500 unit/mL (3 mL) CONCENTRATED injection DIAL TO MAX 100 UNITS BEFORE BREAKFAST, LUNCH, AND DINNER.  INJECT BEFOREMEALS MAX OF 300 UNITS DAILY 18 mL 11   ??? hydrocortisone 2.5 % cream   0   ??? ketoconazole (NIZORAL) 2 % cream   0   ??? magnesium chloride (SLOW_MAG) 64 mg TbEC Take 128 mg by mouth three (3) times a day (at 6am, noon and 6pm).      ??? metoprolol succinate (TOPROL-XL) 100 MG 24 hr tablet Take 1 tablet (100 mg total) by mouth two (2) times a day. 180 tablet 3   ??? miscellaneous medical supply Misc 1 application by Miscellaneous route daily as needed. Lightweight wheelchair. 1 each 0   ??? multivitamin capsule Take 1 capsule by mouth daily.     ??? MYFORTIC 180 mg EC tablet Take 3 tablets (540 mg total) by mouth two (2) times a day. 180 tablet 35   ??? pen needle, diabetic (BD ULTRA-FINE NANO PEN NEEDLE) 32 gauge x 5/32 (4 mm) Ndle 1 each by Other route Three (3) times a day before meals. 300 each 3   ??? pravastatin (PRAVACHOL) 20 MG tablet TAKE 1 TABLET BY MOUTH DAILY 90 each 3   ??? tacrolimus (PROGRAF) 1 MG capsule Take 3 capsules (3mg ) by mouth in the morning and 2 capsules (2mg ) in the evening. 450 capsule 3   ??? warfarin (COUMADIN) 2.5 MG tablet TAKE ONE TABLET BY MOUTH EVERY MONDAY ORAS DIRECTED 30 each 12   ??? warfarin (COUMADIN) 5 MG tablet 5 days a week 30 each 12     No current facility-administered medications for this visit. Facility-Administered Medications Ordered in Other Visits   Medication Dose Route Frequency Provider Last Rate Last Admin   ??? bevacizumab    PRN (once a day) Melvyn Novas, MD   2.5 mg at 01/28/17 0750       Physical Exam    BP 106/58 (BP Site: L Arm, BP Position: Sitting, BP Cuff Size: Large)  - Pulse 87  - Temp 36.5 ??C (97.7 ??F) (Temporal)  - Ht 185.4 cm (6' 1)  - Wt (!) 118.1 kg (260 lb 6.4 oz)  - BMI 34.36 kg/m??   General: Patient is a pleasant male in no apparent distress.  Eyes: Sclera anicteric.  Neck: Supple without LAD/JVD/bruits.  Lungs: Clear to auscultation bilaterally, no wheezes/rales/rhonchi.  Cardiovascular: Regular rate and rhythm without murmurs, rubs or gallops.  Abdomen: Soft, notender/nondistended. Positive bowel sounds. No hepatosplenomegaly, masses or bruits appreciated.  Extremities: 1+ LE edema  Skin: Without rash  Neurological: gait unsteady, distal sensory loss in LEs.  Psychiatric: Mood and affect appropriate.    Laboratory Results    Recent Results (from the past 170 hour(s))   PT-INR    Collection Time: 08/21/19  8:50 AM   Result Value Ref Range    PT 21.6 (H) 10.5 - 13.5 sec    INR 1.87    Phosphorus Level    Collection Time: 08/21/19  8:50 AM   Result Value Ref Range    Phosphorus 3.3 2.4 - 5.1 mg/dL   Magnesium Level    Collection Time: 08/21/19  8:50 AM   Result Value Ref Range    Magnesium 1.7 1.6 - 2.6 mg/dL   Urinalysis    Collection Time: 08/21/19  8:50 AM   Result Value Ref Range    Color, UA Yellow     Clarity, UA Clear     Specific Gravity, UA 1.020 1.005 - 1.030    pH, UA 5.0 5.0 - 9.0    Leukocyte Esterase, UA Negative Negative    Nitrite, UA Negative Negative    Protein, UA 100 mg/dL (A) Negative    Glucose, UA Negative Negative    Ketones, UA Negative Negative    Urobilinogen, UA 0.2 mg/dL 0.2 - 2.0 mg/dL    Bilirubin, UA Negative Negative    Blood, UA Small (A) Negative    RBC, UA 2 <3 /HPF    WBC, UA 4 (H) <2 /HPF    Squam Epithel, UA 3 0 - 5 /HPF    Bacteria, UA Rare (A) None Seen /HPF   Protein/Creatinine Ratio, Urine    Collection Time: 08/21/19  8:50 AM   Result Value Ref Range    Creat U 109.8 Undefined mg/dL    Protein, Ur 16.1 Undefined mg/dL    Protein/Creatinine Ratio, Urine 0.579 Undefined   Comprehensive Metabolic Panel    Collection Time: 08/21/19  8:50 AM   Result Value Ref Range    Sodium 137 135 - 145 mmol/L    Potassium 4.1 3.5 - 5.1 mmol/L    Chloride 103 98 - 107 mmol/L    Anion Gap 9 3 - 11 mmol/L    CO2 25.3 20.0 - 31.0 mmol/L    BUN 24 (H) 9 - 23 mg/dL    Creatinine 0.96 (H) 0.60 - 1.10 mg/dL    BUN/Creatinine Ratio 19     EGFR CKD-EPI Non-African American, Male 55 mL/min/1.69m2    EGFR CKD-EPI African American, Male 64 mL/min/1.22m2    Glucose 252 (H) 70 - 179 mg/dL    Calcium 9.8 8.7 - 04.5 mg/dL    Albumin 3.7 3.4 - 5.0 g/dL    Total Protein 6.8 5.7 - 8.2 g/dL    Total Bilirubin 0.8 0.3 - 1.2 mg/dL    AST 27 <40 U/L    ALT 19 10 - 49 U/L    Alkaline Phosphatase 111 46 - 116 U/L   Iron Profile    Collection Time: 08/21/19  8:50 AM   Result Value Ref Range    Iron 114 35 - 165 ug/dL    TIBC 981.1 914.7 - 829.5 mg/dL    Transferrin 621.3 086.5 - 380.0 mg/dL  Iron Saturation (%) 35 20 - 50 %   Lipid panel    Collection Time: 08/21/19  8:50 AM   Result Value Ref Range    Triglycerides 185 (H) 0 - 150 mg/dL    Cholesterol 161 <=096 mg/dL    HDL 32 (L) 40 - 60 mg/dL    LDL Calculated 66 40 - 100 mg/dL    VLDL Cholesterol Cal 37 12 - 42 mg/dL    Chol/HDL Ratio 4.2 1.0 - 4.5    Non-HDL Cholesterol 103 70 - 130 mg/dL    FASTING No    Vitamin D 25 Hydroxy (25OH D2 + D3)    Collection Time: 08/21/19  8:50 AM   Result Value Ref Range    Vitamin D Total (25OH) 30.6 20.0 - 80.0 ng/mL   CBC w/ Differential    Collection Time: 08/21/19  8:50 AM   Result Value Ref Range    WBC 7.8 3.5 - 10.5 10*9/L    RBC 5.71 4.32 - 5.72 10*12/L    HGB 15.6 13.5 - 17.5 g/dL    HCT 04.5 40.9 - 81.1 %    MCV 83.3 81.0 - 95.0 fL    MCH 27.3 26.0 - 34.0 pg    MCHC 32.7 30.0 - 36.0 g/dL RDW 91.4 (H) 78.2 - 95.6 %    MPV 7.6 7.0 - 10.0 fL    Platelet 218 150 - 450 10*9/L    Neutrophils % 80.3 %    Lymphocytes % 11.4 %    Monocytes % 6.4 %    Eosinophils % 1.1 %    Basophils % 0.8 %    Absolute Neutrophils 6.3 1.7 - 7.7 10*9/L    Absolute Lymphocytes 0.9 0.7 - 4.0 10*9/L    Absolute Monocytes 0.5 0.1 - 1.0 10*9/L    Absolute Eosinophils 0.1 0.0 - 0.7 10*9/L    Absolute Basophils 0.1 0.0 - 0.1 10*9/L

## 2019-08-22 LAB — HEMOGLOBIN A1C: HEMOGLOBIN A1C: 8.3 % — ABNORMAL HIGH (ref 4.8–5.6)

## 2019-08-22 LAB — CMV QUANT: Lab: 0

## 2019-08-22 LAB — HEPATITIS B CORE TOTAL ANTIBODY: Hepatitis B virus core Ab:PrThr:Pt:Ser/Plas:Ord:IA: NONREACTIVE

## 2019-08-22 LAB — HEPATITIS C ANTIBODY: Hepatitis C virus Ab:PrThr:Pt:Ser:Ord:: NONREACTIVE

## 2019-08-22 LAB — CMV DNA, QUANTITATIVE, PCR

## 2019-08-22 LAB — TACROLIMUS, TROUGH: Lab: 3.9 — ABNORMAL LOW

## 2019-08-22 LAB — ESTIMATED AVERAGE GLUCOSE: Estimated average glucose:MCnc:Pt:Bld:Qn:Estimated from glycated hemoglobin: 192

## 2019-08-22 LAB — HIV ANTIGEN/ANTIBODY COMBO: HIV 1+2 Ab+HIV1 p24 Ag:PrThr:Pt:Ser/Plas:Ord:IA: NONREACTIVE

## 2019-08-22 LAB — HEPATITIS B SURFACE ANTIGEN: Hepatitis B virus surface Ag:PrThr:Pt:Ser:Ord:: NONREACTIVE

## 2019-08-23 LAB — EBV VIRAL LOAD RESULT: Lab: NOT DETECTED

## 2019-08-24 DIAGNOSIS — Z79899 Other long term (current) drug therapy: Principal | ICD-10-CM

## 2019-08-24 DIAGNOSIS — Z1159 Encounter for screening for other viral diseases: Principal | ICD-10-CM

## 2019-08-24 DIAGNOSIS — Z940 Kidney transplant status: Principal | ICD-10-CM

## 2019-08-24 LAB — CALCIUM: Calcium:MCnc:Pt:Ser/Plas:Qn:: 9

## 2019-08-24 NOTE — Unmapped (Signed)
Placed orders per MDs Detwiler and Mottl. Advised pt to get urine/lab repeat this week.

## 2019-08-24 NOTE — Unmapped (Signed)
-----   Message from Sharlee Blew, MD sent at 08/21/2019  1:57 PM EDT -----  INR low; what is current dosing?    [name], M.D.     [facility] Internal Medicine at [facility]   [address]  [address]  [place], [place]  [address]  [phone]

## 2019-08-25 ENCOUNTER — Ambulatory Visit: Admit: 2019-08-25 | Discharge: 2019-08-26 | Payer: MEDICARE

## 2019-08-25 DIAGNOSIS — I4891 Unspecified atrial fibrillation: Principal | ICD-10-CM

## 2019-08-25 LAB — CBC W/ AUTO DIFF
BASOPHILS ABSOLUTE COUNT: 0 10*9/L (ref 0.0–0.1)
BASOPHILS RELATIVE PERCENT: 0.6 %
EOSINOPHILS ABSOLUTE COUNT: 0.1 10*9/L (ref 0.0–0.7)
EOSINOPHILS RELATIVE PERCENT: 1.4 %
HEMATOCRIT: 46.8 % (ref 38.0–50.0)
HEMOGLOBIN: 15.9 g/dL (ref 13.5–17.5)
LYMPHOCYTES ABSOLUTE COUNT: 1.1 10*9/L (ref 0.7–4.0)
LYMPHOCYTES RELATIVE PERCENT: 13.9 %
MEAN CORPUSCULAR HEMOGLOBIN CONC: 34 g/dL (ref 30.0–36.0)
MEAN CORPUSCULAR VOLUME: 83 fL (ref 81.0–95.0)
MONOCYTES ABSOLUTE COUNT: 0.6 10*9/L (ref 0.1–1.0)
MONOCYTES RELATIVE PERCENT: 7.5 %
NEUTROPHILS ABSOLUTE COUNT: 6.3 10*9/L (ref 1.7–7.7)
NEUTROPHILS RELATIVE PERCENT: 76.6 %
NUCLEATED RED BLOOD CELLS: 0 /100{WBCs} (ref <=4)
RED BLOOD CELL COUNT: 5.64 10*12/L (ref 4.32–5.72)
RED CELL DISTRIBUTION WIDTH: 14.9 % (ref 12.0–15.0)
WBC ADJUSTED: 8.2 10*9/L (ref 3.5–10.5)

## 2019-08-25 LAB — URINALYSIS
BACTERIA: NONE SEEN /HPF
BILIRUBIN UA: NEGATIVE
KETONES UA: NEGATIVE
LEUKOCYTE ESTERASE UA: NEGATIVE
NITRITE UA: NEGATIVE
PH UA: 6 (ref 5.0–9.0)
PROTEIN UA: 100 — AB
RBC UA: 12 /HPF — ABNORMAL HIGH (ref <3)
SPECIFIC GRAVITY UA: >1.03 (ref 1.005–1.040)
SQUAMOUS EPITHELIAL: 3 /HPF (ref 0–5)
WBC UA: 4 /HPF — ABNORMAL HIGH (ref <2)

## 2019-08-25 LAB — BASIC METABOLIC PANEL
ANION GAP: 12 mmol/L (ref 7–15)
BUN / CREAT RATIO: 18
CHLORIDE: 101 mmol/L (ref 98–107)
CO2: 29 mmol/L (ref 22.0–30.0)
CREATININE: 1.25 mg/dL (ref 0.70–1.30)
EGFR CKD-EPI AA MALE: 66 mL/min/{1.73_m2} (ref >=60)
EGFR CKD-EPI NON-AA MALE: 57 mL/min/{1.73_m2} — ABNORMAL LOW (ref >=60)
GLUCOSE RANDOM: 227 mg/dL — ABNORMAL HIGH (ref 70–179)
POTASSIUM: 4.8 mmol/L (ref 3.5–5.0)
SODIUM: 142 mmol/L (ref 135–145)

## 2019-08-25 LAB — INR: Coagulation tissue factor induced.INR:RelTime:Pt:PPP:Qn:Coag: 2.18

## 2019-08-25 LAB — PHOSPHORUS: Phosphate:MCnc:Pt:Ser/Plas:Qn:: 3.6

## 2019-08-25 LAB — PROTEIN/CREAT RATIO, URINE: Protein/Creatinine:MRto:Pt:Urine:Qn:: 0.717

## 2019-08-25 LAB — ANION GAP: Anion gap 3:SCnc:Pt:Ser/Plas:Qn:: 12

## 2019-08-25 LAB — ALBUMIN / CREATININE URINE RATIO
ALBUMIN/CREATININE RATIO: 506.5 ug/mg — ABNORMAL HIGH (ref 0.0–30.0)
CREATININE, URINE: 145.1 mg/dL

## 2019-08-25 LAB — PROTIME-INR: INR: 2.18

## 2019-08-25 LAB — PROTEIN / CREATININE RATIO, URINE: PROTEIN URINE: 104 mg/dL

## 2019-08-25 LAB — MAGNESIUM: Magnesium:MCnc:Pt:Ser/Plas:Qn:: 1.9

## 2019-08-25 LAB — RBC UA: Erythrocytes:Naric:Pt:Urine sed:Qn:Microscopy.light.HPF: 12 — ABNORMAL HIGH

## 2019-08-25 LAB — PLATELET COUNT: Platelets:NCnc:Pt:Bld:Qn:Automated count: 218

## 2019-08-25 LAB — ALBUMIN/CREATININE RATIO: Albumin/Creatinine:MRto:Pt:Urine:Qn:: 506.5 — ABNORMAL HIGH

## 2019-08-25 NOTE — Unmapped (Signed)
Patient notified to repeat in one month

## 2019-08-25 NOTE — Unmapped (Signed)
-----   Message from Sharlee Blew, MD sent at 08/25/2019 10:07 AM EDT -----  INR ok.    Same dose; repeat INR one month.    [name], M.D.     [facility] Internal Medicine at [facility]   [address]  [address]  [place], [place]  [address]  [phone]

## 2019-08-25 NOTE — Unmapped (Signed)
patietn is taking 2.5 mg coumadin on Mondays and Thursdays and 5 mg all other days.  anticog tracker updated and patient notified.

## 2019-08-26 LAB — VITAMIN D 1,25-DIHYDROXY: 1,25-Dihydroxyvitamin D:MCnc:Pt:Ser/Plas:Qn:: 27

## 2019-08-26 LAB — TACROLIMUS BLOOD: Lab: 3.9

## 2019-08-27 LAB — HLA DS POST TRANSPLANT
ANTI-DONOR DRW #2 MFI: 76 MFI
ANTI-DONOR HLA-A #1 MFI: 0 MFI
ANTI-DONOR HLA-B #1 MFI: 77 MFI
ANTI-DONOR HLA-B #2 MFI: 11 MFI
ANTI-DONOR HLA-C #1 MFI: 0 MFI
ANTI-DONOR HLA-C #2 MFI: 0 MFI
ANTI-DONOR HLA-DQB #1 MFI: 148 MFI

## 2019-08-27 LAB — HLA CL1 ANTIBODY COMM: Lab: 0

## 2019-08-27 LAB — ANTI-DONOR HLA-B #2 MFI: Lab: 11

## 2019-08-27 LAB — HLA CL2 AB COMMENT: Lab: 0

## 2019-08-27 LAB — FSAB CLASS 1 ANTIBODY SPECIFICITY

## 2019-08-31 NOTE — Unmapped (Signed)
unos form

## 2019-09-03 NOTE — Unmapped (Signed)
[facility] Refill Coordination Note    Specialty Medication(s) to be Shipped:   Transplant: Myfortic 180mg  and tacrolimus 1mg     Other medication(s) to be shipped: N/A     David Ellis, DOB: 06-23-1945  Phone: [phone] (home)       All above HIPAA information was verified with patient.     Was a Nurse, learning disability used for this call? No    Completed refill call assessment today to schedule patient's medication shipment from the [facility] [phone]).       Specialty medication(s) and dose(s) confirmed: Regimen is correct and unchanged.   Changes to medications: Mc reports no changes at this time.  Changes to insurance: No  Questions for the pharmacist: No    Confirmed patient received Welcome Packet with first shipment. The patient will receive a drug information handout for each medication shipped and additional FDA Medication Guides as required.       DISEASE/MEDICATION-SPECIFIC INFORMATION        N/A    SPECIALTY MEDICATION ADHERENCE     Medication Adherence    Patient reported X missed doses in the last month: 0  Specialty Medication: Myfortic 180mg   Patient is on additional specialty medications: Yes  Additional Specialty Medications: Tacrolimus 1mg   Patient Reported Additional Medication X Missed Doses in the Last Month: 0  Patient is on more than two specialty medications: No  Adherence tools used: patient uses a pill box to manage medications  Support network for adherence: family member          Myfortic 180 mg: 14 days of medicine on hand   Tacrolimus 1 mg: 14 days of medicine on hand     SHIPPING     Shipping address confirmed in Epic.     Delivery Scheduled: Yes, Expected medication delivery date: 09/15/2019.     Medication will be delivered via Next Day Courier to the prescription address in Epic WAM.    [name]   [facility] Specialty Technician

## 2019-09-14 MED FILL — TACROLIMUS 1 MG CAPSULE, IMMEDIATE-RELEASE: 30 days supply | Qty: 150 | Fill #6 | Status: AC

## 2019-09-14 MED FILL — MYFORTIC 180 MG TABLET,DELAYED RELEASE: ORAL | 30 days supply | Qty: 180 | Fill #9

## 2019-09-14 MED FILL — TACROLIMUS 1 MG CAPSULE, IMMEDIATE-RELEASE: 30 days supply | Qty: 150 | Fill #6

## 2019-09-14 MED FILL — MYFORTIC 180 MG TABLET,DELAYED RELEASE: 30 days supply | Qty: 180 | Fill #9 | Status: AC

## 2019-09-16 DIAGNOSIS — E1165 Type 2 diabetes mellitus with hyperglycemia: Principal | ICD-10-CM

## 2019-09-16 DIAGNOSIS — E1129 Type 2 diabetes mellitus with other diabetic kidney complication: Principal | ICD-10-CM

## 2019-09-16 MED ORDER — TRULICITY 1.5 MG/0.5 ML SUBCUTANEOUS PEN INJECTOR
5 refills | 0 days | Status: CP
Start: 2019-09-16 — End: ?

## 2019-09-16 MED ORDER — PEN NEEDLE, DIABETIC 32 GAUGE X 5/32" (4 MM)
Freq: Three times a day (TID) | 2 refills | 100 days | Status: CP
Start: 2019-09-16 — End: ?

## 2019-09-21 ENCOUNTER — Ambulatory Visit: Admit: 2019-09-21 | Discharge: 2019-09-22 | Payer: MEDICARE

## 2019-09-21 DIAGNOSIS — E1129 Type 2 diabetes mellitus with other diabetic kidney complication: Secondary | ICD-10-CM

## 2019-09-21 DIAGNOSIS — Z0000 Encounter for general adult medical examination without abnormal findings: Principal | ICD-10-CM

## 2019-09-21 DIAGNOSIS — E1165 Type 2 diabetes mellitus with hyperglycemia: Secondary | ICD-10-CM

## 2019-09-21 DIAGNOSIS — Z940 Kidney transplant status: Principal | ICD-10-CM

## 2019-09-21 DIAGNOSIS — I10 Essential (primary) hypertension: Principal | ICD-10-CM

## 2019-09-21 DIAGNOSIS — I2699 Other pulmonary embolism without acute cor pulmonale: Principal | ICD-10-CM

## 2019-09-21 NOTE — Unmapped (Signed)
Reports outstanding control at home.

## 2019-09-21 NOTE — Unmapped (Signed)
Problem List Items Addressed This Visit        High    Hypertension    Pulmonary embolism (CMS-HCC) - Primary    Type II diabetes mellitus with renal manifestations, uncontrolled (CMS-HCC)

## 2019-09-21 NOTE — Unmapped (Signed)
REmains on coumadin; no bleeding.  Did miss a dose 5 days ago; return later in the week for labs.

## 2019-09-21 NOTE — Unmapped (Signed)
Creatinine has remained stable.Continue to close follow up with renal transplant team.

## 2019-09-21 NOTE — Unmapped (Signed)
SEt up FIT testing.  Has received COVID 2/2

## 2019-09-21 NOTE — Unmapped (Signed)
Sugars per endocrinology; he'll return later this week for labs.   A1C up a bit; seeing endofrinology regularly.

## 2019-09-21 NOTE — Unmapped (Signed)
Patient ID: David Ellis is a 74 y.o. male who presents for eval of medical issues.        Assessment/Plan:        Type II diabetes mellitus with renal manifestations, uncontrolled (CMS-HCC)  Sugars per endocrinology; he'll return later this week for labs.   A1C up a bit; seeing endofrinology regularly.      Pulmonary embolism (CMS-HCC)  REmains on coumadin; no bleeding.  Did miss a dose 5 days ago; return later in the week for labs.    Transplanted kidney  Creatinine has remained stable.Continue to close follow up with renal transplant team.      Hypertension  Reports outstanding control at home.      Routine general medical examination at a health care facility  SEt up FIT testing.  Has received COVID 2/2      Orders Placed This Encounter   Procedures   ??? Immunochemical Fecal Occult Blood Test (FIT), automated       Medication adherence and barriers to the treatment plan have been addressed. Opportunities to optimize healthy behaviors have been discussed. Patient / caregiver voiced understanding.       Return in about 6 months (around 03/22/2020).     Subjective:     Current Health Status  Patient Active Problem List    Diagnosis Date Noted   ??? Diabetic ulcer of left foot associated with type 2 diabetes mellitus (CMS-HCC) 07/03/2014   ??? Routine general medical examination at a health care facility 11/17/2013   ??? Atrial fibrillation (CMS-HCC) 07/06/2013   ??? Right ventricular dysfunction 06/30/2013   ??? Pulmonary embolism (CMS-HCC) 05/23/2013   ??? Transplanted kidney    ??? Mixed hyperlipidemia 10/30/2011   ??? Type II diabetes mellitus with renal manifestations, uncontrolled (CMS-HCC) 03/26/2011   ??? Hypertension 10/10/2010   ??? Syncope 02/03/2019   ??? Immunosuppressed status (CMS-HCC) 09/02/2018   ??? Class 2 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 01/01/2018   ??? PAD (peripheral artery disease) (CMS-HCC) 02/12/2017   ??? Hypoglycemia associated with type 2 diabetes mellitus (CMS-HCC) 09/03/2016   ??? BCC (basal cell carcinoma), face 07/18/2015 ??? Left knee pain 11/09/2014   ??? On statin therapy due to risk of future cardiovascular event 10/04/2014   ??? 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 Acquired absence of other toe(s), unspecified side (CMS-HCC) 08/10/2014   ??? Diabetic polyneuropathy associated with type 2 diabetes mellitus (CMS-HCC) 03/15/2014   ??? Aftercare following organ transplant 08/27/2013   ??? Edema 06/30/2013   ??? 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 History of kidney transplant 10/30/2011   ??? 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 Diverticulosis of colon 03/09/2011   ??? History of colonic polyps 03/09/2011      Here today for eval of medical issues.   He reports that he has gotten COVID 2/2 vaccines.      He reports that generally, he has been doing well.  He may hav emissed coumaidn 5 days ago.  He has been getting gcoumadin labs drawn at [facility].  No bleeding.      He reports that his bp is being checked occasionally.  REports that it is in the 120s/80.   No bleeding with the coumaidn.    Continues on meds for diabetes and reports sugars bumped a bit.  Seeing Dr. Tiburcio Pea still for DM.    No other complaints.  The patient is without complaints of chest pain, sob, fever,  chills, nausea, vomiting, diarrhea, constipation, BRBPR, melena, abdominal pains, weight changes, confusion, headaches.        Allergies   Allergen Reactions   ??? Levofloxacin Other (See Comments)     Other reaction(s): Unknown  Unknown   ??? Lidocaine Other (See Comments)     I don't remember States is fine with Bupivicaine   ??? Penicillins      As child  Other reaction(s): UNKNOWN.   (UPDATE: Tolerated amp/sulbactam without side effects during 10/2014 hospitalization)   ??? Egg Derived Rash   ??? Enalapril Cough   ??? Epinephrine Palpitations   ??? Grass Pollen-[place], Standard Itching   ??? Lisinopril Rash   ??? Mepivacaine Hcl Palpitations       Current Outpatient Medications   Medication Sig Dispense Refill   ??? aspirin (ECOTRIN) 81 MG tablet Take 81 mg by mouth continuous as needed. Only when flying and/or traveling by car more than 2 hours.     ??? blood-glucose meter kit Use as instructed. 1 each 0   ??? cholecalciferol, vitamin D3, (VITAMIN D3) 2,000 unit Tab Take 2,000 Units by mouth daily.      ??? furosemide (LASIX) 40 MG tablet TAKE 1 TO 2 TABLETS ONCE A DAY 180 each 3   ??? gabapentin (NEURONTIN) 300 MG capsule Take 1 capsule (300 mg total) by mouth Two (2) times a day. 180 each 3   ??? HUMULIN R U-500, CONC, KWIKPEN 500 unit/mL (3 mL) CONCENTRATED injection DIAL TO MAX 100 UNITS BEFORE BREAKFAST, LUNCH, AND DINNER.  INJECT BEFOREMEALS MAX OF 300 UNITS DAILY 18 mL 11   ??? magnesium chloride (SLOW_MAG) 64 mg TbEC Take 128 mg by mouth three (3) times a day (at 6am, noon and 6pm).      ??? metoprolol succinate (TOPROL-XL) 100 MG 24 hr tablet Take 1 tablet (100 mg total) by mouth two (2) times a day. 180 tablet 3   ??? miscellaneous medical supply Misc 1 application by Miscellaneous route daily as needed. Lightweight wheelchair. 1 each 0   ??? multivitamin capsule Take 1 capsule by mouth daily.     ??? MYFORTIC 180 mg EC tablet Take 3 tablets (540 mg total) by mouth two (2) times a day. 180 tablet 35   ??? pen needle, diabetic (BD ULTRA-FINE NANO PEN NEEDLE) 32 gauge x 5/32 (4 mm) Ndle 1 each by Other route Three (3) times a day before meals. 300 each 2   ??? pravastatin (PRAVACHOL) 20 MG tablet TAKE 1 TABLET BY MOUTH DAILY 90 each 3   ??? tacrolimus (PROGRAF) 1 MG capsule Take 3 capsules (3mg ) by mouth in the morning and 2 capsules (2mg ) in the evening. 450 capsule 3   ??? TRULICITY 1.5 mg/0.5 mL PnIj INJECT 1.5MG (0.5ML) SUBCUTANEOUSLY ONCE A WEEK 2 mL 5   ??? warfarin (COUMADIN) 2.5 MG tablet TAKE ONE TABLET BY MOUTH EVERY MONDAY ORAS DIRECTED 30 each 12   ??? warfarin (COUMADIN) 5 MG tablet 5 days a week 30 each 12   ??? hydrocortisone 2.5 % cream  (Patient not taking: Reported on 09/21/2019)  0   ??? ketoconazole (NIZORAL) 2 % cream  (Patient not taking: Reported on 09/21/2019)  0     No current facility-administered medications for this visit.     Facility-Administered Medications Ordered in Other Visits   Medication Dose Route Frequency Provider Last Rate Last Admin   ??? bevacizumab    PRN (once a day) Melvyn Novas, MD   2.5 mg at  01/28/17 0750       Past Medical History:   Diagnosis Date   ??? Atrial flutter (CMS-HCC)    ??? Diabetes mellitus (CMS-HCC)    ??? Diabetic nephropathy (CMS-HCC)    ??? Diabetic retinopathy (CMS-HCC)    ??? Fractures    ??? Ganglion cyst    ??? Hand injury    ??? Heart disease    ??? Hyperlipidemia    ??? Hypertension    ??? Joint pain    ??? Osteomyelitis (CMS-HCC) June 2016   ??? Pulmonary embolism (CMS-HCC) Dec 2015   ??? 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 Stroke (CMS-HCC)    ??? Tear of meniscus of knee    ??? 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 NEPHRECTOMY TRANSPLANTED ORGAN Right     LURD - spouse received in 2013.   ??? PR AMPUTATION METATARSAL+TOE,SINGLE Left 07/06/2014    Procedure: AMPUTATION, METATARSAL, WITH TOE SINGLE;  Surgeon: Marion Downer, MD;  [facility]: [facility];  Service: Vascular   ??? PR AMPUTATION METATARSAL+TOE,SINGLE Left 10/28/2014    Procedure: AMPUTATION, METATARSAL, WITH TOE SINGLE;  Surgeon: Maple Mirza, MD;  Location: MAIN OR [facility];  Service: Vascular   ??? PR VITRECTOMY,PANRETINAL LASER RX Right 11/26/2016    Procedure: VITRECTOMY, MECHANICAL, PARS PLANA APPROACH; WITH ENDOLASER PANRETINAL PHOTOCOAGULATION;  Surgeon: Melvyn Novas, MD;  Location: [facility] OR [facility];  Service: Ophthalmology   ??? PR VITRECTOMY,PANRETINAL LASER RX Left 01/28/2017    Procedure: VITRECTOMY, MECHANICAL, PARS PLANA APPROACH; WITH ENDOLASER PANRETINAL PHOTOCOAGULATION;  Surgeon: Melvyn Novas, MD;  Location: [facility] OR [facility];  Service: Ophthalmology   ??? SKIN BIOPSY         Family History   Problem Relation Age of Onset   ??? Kidney disease Mother    ??? Diabetes Mother    ??? Kidney disease Father    ??? Kidney disease Maternal Grandmother    ??? Diabetes Maternal Grandmother    ??? Glaucoma Neg Hx    ??? Macular degeneration Neg Hx    ??? Strabismus Neg Hx        Social History     Tobacco Use   ??? Smoking status: Never Smoker   ??? Smokeless tobacco: Never Used   Substance Use Topics   ??? Alcohol use: No   ??? Drug use: No             Objective:       Vital Signs  BP 140/90 (BP Site: L Arm, BP Position: Sitting)  - Pulse 75  - Temp 36.7 ??C (98 ??F) (Temporal)  - Ht 180.3 cm (5' 11)  - Wt (!) 116.1 kg (256 lb)  - SpO2 97%  - BMI 35.70 kg/m??      Exam  WEll appearing male in nad.

## 2019-09-25 ENCOUNTER — Ambulatory Visit: Admit: 2019-09-25 | Discharge: 2019-09-26 | Payer: MEDICARE

## 2019-09-25 LAB — COMPREHENSIVE METABOLIC PANEL
ALBUMIN: 3.9 g/dL (ref 3.5–5.0)
ALKALINE PHOSPHATASE: 94 U/L (ref 38–126)
ALT (SGPT): 20 U/L (ref <50)
ANION GAP: 10 mmol/L (ref 7–15)
AST (SGOT): 30 U/L (ref 19–55)
BILIRUBIN TOTAL: 0.9 mg/dL (ref 0.0–1.2)
BLOOD UREA NITROGEN: 27 mg/dL — ABNORMAL HIGH (ref 7–21)
BUN / CREAT RATIO: 22
CALCIUM: 9.3 mg/dL (ref 8.5–10.2)
CO2: 31 mmol/L — ABNORMAL HIGH (ref 22.0–30.0)
EGFR CKD-EPI AA MALE: 69 mL/min/{1.73_m2} (ref >=60)
EGFR CKD-EPI NON-AA MALE: 59 mL/min/{1.73_m2} — ABNORMAL LOW (ref >=60)
GLUCOSE RANDOM: 183 mg/dL — ABNORMAL HIGH (ref 70–179)
POTASSIUM: 5.5 mmol/L — ABNORMAL HIGH (ref 3.5–5.0)
PROTEIN TOTAL: 6.8 g/dL (ref 6.5–8.3)
SODIUM: 145 mmol/L (ref 135–145)

## 2019-09-25 LAB — PHOSPHORUS
PHOSPHORUS: 4 mg/dL (ref 2.9–4.7)
Phosphate:MCnc:Pt:Ser/Plas:Qn:: 4

## 2019-09-25 LAB — CBC W/ AUTO DIFF
BASOPHILS ABSOLUTE COUNT: 0 10*9/L (ref 0.0–0.1)
EOSINOPHILS ABSOLUTE COUNT: 0.1 10*9/L (ref 0.0–0.7)
EOSINOPHILS RELATIVE PERCENT: 1.8 %
HEMATOCRIT: 48.6 % (ref 38.0–50.0)
HEMOGLOBIN: 16.5 g/dL (ref 13.5–17.5)
LYMPHOCYTES ABSOLUTE COUNT: 1.1 10*9/L (ref 0.7–4.0)
MEAN CORPUSCULAR HEMOGLOBIN CONC: 33.9 g/dL (ref 30.0–36.0)
MEAN CORPUSCULAR VOLUME: 83.2 fL (ref 81.0–95.0)
MEAN PLATELET VOLUME: 7.6 fL (ref 7.0–10.0)
MONOCYTES ABSOLUTE COUNT: 0.5 10*9/L (ref 0.1–1.0)
MONOCYTES RELATIVE PERCENT: 8 %
NEUTROPHILS ABSOLUTE COUNT: 5 10*9/L (ref 1.7–7.7)
NEUTROPHILS RELATIVE PERCENT: 73.2 %
NUCLEATED RED BLOOD CELLS: 0 /100{WBCs} (ref <=4)
PLATELET COUNT: 202 10*9/L (ref 150–450)
RED BLOOD CELL COUNT: 5.84 10*12/L — ABNORMAL HIGH (ref 4.32–5.72)
RED CELL DISTRIBUTION WIDTH: 14.5 % (ref 12.0–15.0)

## 2019-09-25 LAB — TACROLIMUS BLOOD: Lab: 4.4

## 2019-09-25 LAB — GLUCOSE RANDOM: Glucose:MCnc:Pt:Ser/Plas:Qn:: 183 — ABNORMAL HIGH

## 2019-09-25 LAB — HEMATOCRIT: Hematocrit:VFr:Pt:Bld:Qn:: 48.6

## 2019-09-25 LAB — MAGNESIUM: Magnesium:MCnc:Pt:Ser/Plas:Qn:: 1.9

## 2019-09-25 LAB — INR: Coagulation tissue factor induced.INR:RelTime:Pt:PPP:Qn:Coag: 1.85

## 2019-10-06 NOTE — Unmapped (Signed)
[facility] Clinical Assessment & Refill Coordination Note    LEMARCUS Ellis, DOB: 1946-04-08  Phone: [phone] (home)     All above HIPAA information was verified with patient's family member, wife.     Was a Nurse, learning disability used for this call? No    Specialty Medication(s):   Transplant: Myfortic 180mg  and tacrolimus 1mg      Current Outpatient Medications   Medication Sig Dispense Refill   ??? aspirin (ECOTRIN) 81 MG tablet Take 81 mg by mouth continuous as needed. Only when flying and/or traveling by car more than 2 hours.     ??? blood-glucose meter kit Use as instructed. 1 each 0   ??? cholecalciferol, vitamin D3, (VITAMIN D3) 2,000 unit Tab Take 2,000 Units by mouth daily.      ??? furosemide (LASIX) 40 MG tablet TAKE 1 TO 2 TABLETS ONCE A DAY 180 each 3   ??? gabapentin (NEURONTIN) 300 MG capsule Take 1 capsule (300 mg total) by mouth Two (2) times a day. 180 each 3   ??? HUMULIN R U-500, CONC, KWIKPEN 500 unit/mL (3 mL) CONCENTRATED injection DIAL TO MAX 100 UNITS BEFORE BREAKFAST, LUNCH, AND DINNER.  INJECT BEFOREMEALS MAX OF 300 UNITS DAILY 18 mL 11   ??? hydrocortisone 2.5 % cream  (Patient not taking: Reported on 09/21/2019)  0   ??? ketoconazole (NIZORAL) 2 % cream  (Patient not taking: Reported on 09/21/2019)  0   ??? magnesium chloride (SLOW_MAG) 64 mg TbEC Take 128 mg by mouth three (3) times a day (at 6am, noon and 6pm).      ??? metoprolol succinate (TOPROL-XL) 100 MG 24 hr tablet Take 1 tablet (100 mg total) by mouth two (2) times a day. 180 tablet 3   ??? miscellaneous medical supply Misc 1 application by Miscellaneous route daily as needed. Lightweight wheelchair. 1 each 0   ??? multivitamin capsule Take 1 capsule by mouth daily.     ??? MYFORTIC 180 mg EC tablet Take 3 tablets (540 mg total) by mouth two (2) times a day. 180 tablet 35   ??? pen needle, diabetic (BD ULTRA-FINE NANO PEN NEEDLE) 32 gauge x 5/32 (4 mm) Ndle 1 each by Other route Three (3) times a day before meals. 300 each 2   ??? pravastatin (PRAVACHOL) 20 MG tablet TAKE 1 TABLET BY MOUTH DAILY 90 each 3   ??? tacrolimus (PROGRAF) 1 MG capsule Take 3 capsules (3mg ) by mouth in the morning and 2 capsules (2mg ) in the evening. 450 capsule 3   ??? TRULICITY 1.5 mg/0.5 mL PnIj INJECT 1.5MG (0.5ML) SUBCUTANEOUSLY ONCE A WEEK 2 mL 5   ??? warfarin (COUMADIN) 2.5 MG tablet TAKE ONE TABLET BY MOUTH EVERY MONDAY ORAS DIRECTED 30 each 12   ??? warfarin (COUMADIN) 5 MG tablet 5 days a week 30 each 12     No current facility-administered medications for this visit.     Facility-Administered Medications Ordered in Other Visits   Medication Dose Route Frequency Provider Last Rate Last Admin   ??? bevacizumab    PRN (once a day) Melvyn Novas, MD   2.5 mg at 01/28/17 0750        Changes to medications: patient's wife doesn't have med list handy but says nothing has changed recently    Allergies   Allergen Reactions   ??? Levofloxacin Other (See Comments)     Other reaction(s): Unknown  Unknown   ??? Lidocaine Other (See Comments)  I don't remember States is fine with Bupivicaine   ??? Penicillins      As child  Other reaction(s): UNKNOWN.   (UPDATE: Tolerated amp/sulbactam without side effects during 10/2014 hospitalization)   ??? Egg Derived Rash   ??? Enalapril Cough   ??? Epinephrine Palpitations   ??? Grass Pollen-[place], Standard Itching   ??? Lisinopril Rash   ??? Mepivacaine Hcl Palpitations       Changes to allergies: No    SPECIALTY MEDICATION ADHERENCE     Myfortic 180mg   : 12 days of medicine on hand   Tacrolimus 1mg   : 12 days of medicine on hand     Medication Adherence    Patient reported X missed doses in the last month: 0  Specialty Medication: tacrolimus 1mg   Patient is on additional specialty medications: Yes  Additional Specialty Medications: Myfortic 180mg   Patient Reported Additional Medication X Missed Doses in the Last Month: 0  Adherence tools used: patient uses a pill box to manage medications  Support network for adherence: family member Specialty medication(s) dose(s) confirmed: Regimen is correct and unchanged.     Are there any concerns with adherence? No    Adherence counseling provided? Not needed    CLINICAL MANAGEMENT AND INTERVENTION      Clinical Benefit Assessment:    Do you feel the medicine is effective or helping your condition? Yes    Clinical Benefit counseling provided? Not needed    Adverse Effects Assessment:    Are you experiencing any side effects? No    Are you experiencing difficulty administering your medicine? No    Quality of Life Assessment:    How many days over the past month did your transplant  keep you from your normal activities? For example, brushing your teeth or getting up in the morning. 0    Have you discussed this with your provider? Not needed    Therapy Appropriateness:    Is therapy appropriate? Yes, therapy is appropriate and should be continued    DISEASE/MEDICATION-SPECIFIC INFORMATION      N/A    PATIENT SPECIFIC NEEDS     - Does the patient have any physical, cognitive, or cultural barriers? No    - Is the patient high risk? Yes, patient is taking a REMS drug. Medication is dispensed in compliance with REMS program.     - Does the patient require a Care Management Plan? No     - Does the patient require physician intervention or other additional services (i.e. nutrition, smoking cessation, social work)? No      SHIPPING     Specialty Medication(s) to be Shipped:   Transplant: Myfortic 180mg  and tacrolimus 1mg     Other medication(s) to be shipped: na     Changes to insurance: No    Delivery Scheduled: Yes, Expected medication delivery date: 10/15/2019.     Medication will be delivered via UPS to the confirmed prescription address in [facility].    The patient will receive a drug information handout for each medication shipped and additional FDA Medication Guides as required.  Verified that patient has previously received a Conservation officer, historic buildings.    All of the patient's questions and concerns have been addressed.    [name]   [facility] Specialty Pharmacist

## 2019-10-12 DIAGNOSIS — I10 Essential (primary) hypertension: Principal | ICD-10-CM

## 2019-10-12 MED ORDER — FUROSEMIDE 40 MG TABLET
ORAL_TABLET | 0 refills | 0.00000 days
Start: 2019-10-12 — End: ?

## 2019-10-13 ENCOUNTER — Encounter: Admit: 2019-10-13 | Discharge: 2019-10-13 | Payer: MEDICARE

## 2019-10-13 ENCOUNTER — Ambulatory Visit: Admit: 2019-10-13 | Discharge: 2019-10-13 | Payer: MEDICARE

## 2019-10-14 MED FILL — TACROLIMUS 1 MG CAPSULE, IMMEDIATE-RELEASE: 30 days supply | Qty: 150 | Fill #7 | Status: AC

## 2019-10-14 MED FILL — MYFORTIC 180 MG TABLET,DELAYED RELEASE: ORAL | 30 days supply | Qty: 180 | Fill #10

## 2019-10-14 MED FILL — MYFORTIC 180 MG TABLET,DELAYED RELEASE: 30 days supply | Qty: 180 | Fill #10 | Status: AC

## 2019-10-14 MED FILL — TACROLIMUS 1 MG CAPSULE, IMMEDIATE-RELEASE: 30 days supply | Qty: 150 | Fill #7

## 2019-10-14 NOTE — Unmapped (Signed)
Patient requesting a prescription for CPAP supplies be sent to [facility] phone # [phone] Attn Orinda Kenner.

## 2019-10-16 NOTE — Unmapped (Signed)
Please confirm what he needs.  This is what I have for his CPAP from 2015:    Recommend CPAP 14 cm H2O with EPR 3 and heated humidifier for nasal dryness, mask: ResMed Activa Lt nasal mask (Large).       [name], M.D.     [facility] at [facility]   [address]  [address]  [place], [place]  [address]  [phone]

## 2019-10-17 DIAGNOSIS — I10 Essential (primary) hypertension: Principal | ICD-10-CM

## 2019-10-17 MED ORDER — FUROSEMIDE 40 MG TABLET
ORAL_TABLET | 0 refills | 0 days
Start: 2019-10-17 — End: ?

## 2019-10-19 MED ORDER — FUROSEMIDE 40 MG TABLET
ORAL_TABLET | 3 refills | 0 days | Status: CP
Start: 2019-10-19 — End: ?

## 2019-10-19 NOTE — Unmapped (Signed)
Refilled lasix. 40mg  1-2 daily prn edema. #180; 3 refills

## 2019-10-26 ENCOUNTER — Ambulatory Visit: Admit: 2019-10-26 | Discharge: 2019-10-27 | Payer: MEDICARE

## 2019-10-26 LAB — CBC W/ AUTO DIFF
BASOPHILS RELATIVE PERCENT: 0.7 %
EOSINOPHILS RELATIVE PERCENT: 1 %
HEMATOCRIT: 48.2 % (ref 38.0–50.0)
HEMOGLOBIN: 16.2 g/dL (ref 13.5–17.5)
LYMPHOCYTES ABSOLUTE COUNT: 1.2 10*9/L (ref 0.7–4.0)
LYMPHOCYTES RELATIVE PERCENT: 14.4 %
MEAN CORPUSCULAR HEMOGLOBIN CONC: 33.6 g/dL (ref 30.0–36.0)
MEAN CORPUSCULAR HEMOGLOBIN: 28.3 pg (ref 26.0–34.0)
MEAN CORPUSCULAR VOLUME: 84.2 fL (ref 81.0–95.0)
MEAN PLATELET VOLUME: 7.7 fL (ref 7.0–10.0)
MONOCYTES ABSOLUTE COUNT: 0.7 10*9/L (ref 0.1–1.0)
MONOCYTES RELATIVE PERCENT: 7.6 %
NEUTROPHILS ABSOLUTE COUNT: 6.6 10*9/L (ref 1.7–7.7)
NUCLEATED RED BLOOD CELLS: 0 /100{WBCs} (ref <=4)
PLATELET COUNT: 212 10*9/L (ref 150–450)
RED BLOOD CELL COUNT: 5.72 10*12/L (ref 4.32–5.72)
RED CELL DISTRIBUTION WIDTH: 14.6 % (ref 12.0–15.0)
WBC ADJUSTED: 8.6 10*9/L (ref 3.5–10.5)

## 2019-10-26 LAB — COMPREHENSIVE METABOLIC PANEL
ALBUMIN: 3.5 g/dL (ref 3.4–5.0)
ALKALINE PHOSPHATASE: 104 U/L (ref 46–116)
ALT (SGPT): 20 U/L (ref 10–49)
ANION GAP: 5 mmol/L (ref 3–11)
AST (SGOT): 25 U/L (ref <34)
BILIRUBIN TOTAL: 0.8 mg/dL (ref 0.3–1.2)
BLOOD UREA NITROGEN: 23 mg/dL (ref 9–23)
BUN / CREAT RATIO: 17
CHLORIDE: 102 mmol/L (ref 98–107)
CO2: 29.7 mmol/L (ref 20.0–31.0)
CREATININE: 1.37 mg/dL — ABNORMAL HIGH (ref 0.60–1.10)
EGFR CKD-EPI AA MALE: 59 mL/min/{1.73_m2}
EGFR CKD-EPI NON-AA MALE: 51 mL/min/{1.73_m2}
GLUCOSE RANDOM: 207 mg/dL — ABNORMAL HIGH (ref 70–179)
POTASSIUM: 4.6 mmol/L (ref 3.5–5.1)
PROTEIN TOTAL: 6.8 g/dL (ref 5.7–8.2)
SODIUM: 137 mmol/L (ref 135–145)

## 2019-10-26 LAB — INR: Coagulation tissue factor induced.INR:RelTime:Pt:PPP:Qn:Coag: 1.62

## 2019-10-26 LAB — MAGNESIUM: Magnesium:MCnc:Pt:Ser/Plas:Qn:: 1.9

## 2019-10-26 LAB — PROTIME-INR: PROTIME: 18.8 s — ABNORMAL HIGH (ref 10.5–13.5)

## 2019-10-26 LAB — TACROLIMUS BLOOD: Lab: 4.3

## 2019-10-26 LAB — CHLORIDE: Chloride:SCnc:Pt:Ser/Plas:Qn:: 102

## 2019-10-26 LAB — BASOPHILS RELATIVE PERCENT: Basophils/100 leukocytes:NFr:Pt:Bld:Qn:Automated count: 0.7

## 2019-10-27 NOTE — Unmapped (Signed)
Take an extra (he's low)

## 2019-10-27 NOTE — Unmapped (Signed)
Same dose; repeat POCT INR one week.    [name], M.D.     [facility] Internal Medicine at [facility]   [address]  [address]  [place], [place]  [address]  [phone]

## 2019-10-27 NOTE — Unmapped (Signed)
-----   Message from Sharlee Blew, MD sent at 10/27/2019  8:06 AM EDT -----  Call and let patient know labs ok except INR is low.  What is current coumadin dosing?[name], M.D.     [facility] Internal Medicine at [facility]   [address]  [address]  [place], [place]  [address]  [phone]

## 2019-10-27 NOTE — Unmapped (Signed)
Called and notified patient as advised by provider. Patient was appreciative of the call.

## 2019-10-27 NOTE — Unmapped (Signed)
Patient is currently taking 5 days on 5mg  and 2 days taking 2.5mg . He missed one day of medication, because he could not remember if he took it or not. He is currently asking if he can't remember if he took his medication is it better to take a tablet or to not take a tablet?

## 2019-10-29 MED ORDER — MISCELLANEOUS MEDICAL SUPPLY MISC
Freq: Every evening | 0 refills | 0.00000 days | Status: CP
Start: 2019-10-29 — End: ?

## 2019-10-29 NOTE — Unmapped (Signed)
Signed and in outbocx.    [name], M.D.     [facility] Internal Medicine at [facility]   [address]  [address]  [place], [place]  [address]  [phone]

## 2019-10-29 NOTE — Unmapped (Signed)
Called patient and verified information from MD is correct. Patient reports is correct.

## 2019-10-29 NOTE — Unmapped (Signed)
Called [facility] at [phone] to get fax #. Faxed CPAP supply order and sleep study to [phone] Attn: Orinda Kenner.

## 2019-11-04 ENCOUNTER — Ambulatory Visit: Admit: 2019-11-04 | Discharge: 2019-11-05 | Payer: MEDICARE

## 2019-11-04 LAB — PROTIME: Coagulation tissue factor induced:Time:Pt:PPP:Qn:Coag: 19.7 — ABNORMAL HIGH

## 2019-11-04 LAB — PROTIME-INR: INR: 1.7

## 2019-11-05 NOTE — Unmapped (Signed)
[facility] Refill Coordination Note    Specialty Medication(s) to be Shipped:   Transplant: Myfortic 180mg  and tacrolimus 1mg     Other medication(s) to be shipped: N/A     David Ellis, DOB: April 26, 1946  Phone: [phone] (home)       All above HIPAA information was verified with patient.     Was a Nurse, learning disability used for this call? No    Completed refill call assessment today to schedule patient's medication shipment from the [facility] [phone]).       Specialty medication(s) and dose(s) confirmed: Regimen is correct and unchanged.   Changes to medications: David Ellis reports no changes at this time.  Changes to insurance: No  Questions for the pharmacist: No    Confirmed patient received Welcome Packet with first shipment. The patient will receive a drug information handout for each medication shipped and additional FDA Medication Guides as required.       DISEASE/MEDICATION-SPECIFIC INFORMATION        N/A    SPECIALTY MEDICATION ADHERENCE     Medication Adherence    Patient reported X missed doses in the last month: 0  Specialty Medication: Myfortic 180mg   Patient is on additional specialty medications: Yes  Additional Specialty Medications: Tacrolimus 1mg   Patient Reported Additional Medication X Missed Doses in the Last Month: 0  Patient is on more than two specialty medications: No  Adherence tools used: patient uses a pill box to manage medications  Support network for adherence: family member          Myfortic 180 mg: 8 days of medicine on hand   Tacrolimus 1 mg: 8 days of medicine on hand     SHIPPING     Shipping address confirmed in Epic.     Delivery Scheduled: Yes, Expected medication delivery date: 11/13/2019.     Medication will be delivered via Next Day Courier to the prescription address in Epic W[facility].    [name]   [facility] Specialty Technician

## 2019-11-12 MED FILL — TACROLIMUS 1 MG CAPSULE, IMMEDIATE-RELEASE: 30 days supply | Qty: 150 | Fill #8 | Status: AC

## 2019-11-12 MED FILL — TACROLIMUS 1 MG CAPSULE, IMMEDIATE-RELEASE: 30 days supply | Qty: 150 | Fill #8

## 2019-11-12 MED FILL — MYFORTIC 180 MG TABLET,DELAYED RELEASE: 30 days supply | Qty: 180 | Fill #11 | Status: AC

## 2019-11-12 MED FILL — MYFORTIC 180 MG TABLET,DELAYED RELEASE: ORAL | 30 days supply | Qty: 180 | Fill #11

## 2019-11-18 ENCOUNTER — Ambulatory Visit: Admit: 2019-11-18 | Discharge: 2019-11-19 | Payer: MEDICARE

## 2019-11-18 LAB — COMPREHENSIVE METABOLIC PANEL
ALBUMIN: 3.4 g/dL (ref 3.4–5.0)
ALKALINE PHOSPHATASE: 108 U/L (ref 46–116)
ALT (SGPT): 20 U/L (ref 10–49)
ANION GAP: 5 mmol/L (ref 3–11)
AST (SGOT): 27 U/L (ref <34)
BILIRUBIN TOTAL: 0.7 mg/dL (ref 0.3–1.2)
BLOOD UREA NITROGEN: 22 mg/dL (ref 9–23)
BUN / CREAT RATIO: 15
CALCIUM: 9 mg/dL (ref 8.7–10.4)
CHLORIDE: 103 mmol/L (ref 98–107)
CO2: 30 mmol/L (ref 20.0–31.0)
CREATININE: 1.44 mg/dL — ABNORMAL HIGH (ref 0.60–1.10)
EGFR CKD-EPI AA MALE: 56 mL/min/{1.73_m2}
GLUCOSE RANDOM: 241 mg/dL — ABNORMAL HIGH (ref 70–179)
PROTEIN TOTAL: 6.6 g/dL (ref 5.7–8.2)
SODIUM: 138 mmol/L (ref 135–145)

## 2019-11-18 LAB — CBC W/ AUTO DIFF
BASOPHILS RELATIVE PERCENT: 0.5 %
EOSINOPHILS ABSOLUTE COUNT: 0.1 10*9/L (ref 0.0–0.7)
EOSINOPHILS RELATIVE PERCENT: 0.8 %
HEMATOCRIT: 48.4 % (ref 38.0–50.0)
HEMOGLOBIN: 16.4 g/dL (ref 13.5–17.5)
LYMPHOCYTES ABSOLUTE COUNT: 1 10*9/L (ref 0.7–4.0)
LYMPHOCYTES RELATIVE PERCENT: 12.7 %
MEAN CORPUSCULAR HEMOGLOBIN CONC: 33.9 g/dL (ref 30.0–36.0)
MEAN CORPUSCULAR VOLUME: 83.4 fL (ref 81.0–95.0)
MEAN PLATELET VOLUME: 7.7 fL (ref 7.0–10.0)
MONOCYTES ABSOLUTE COUNT: 0.7 10*9/L (ref 0.1–1.0)
NEUTROPHILS ABSOLUTE COUNT: 6.2 10*9/L (ref 1.7–7.7)
NEUTROPHILS RELATIVE PERCENT: 77.7 %
NUCLEATED RED BLOOD CELLS: 0 /100{WBCs} (ref <=4)
RED BLOOD CELL COUNT: 5.8 10*12/L — ABNORMAL HIGH (ref 4.32–5.72)
RED CELL DISTRIBUTION WIDTH: 14.4 % (ref 12.0–15.0)
WBC ADJUSTED: 8 10*9/L (ref 3.5–10.5)

## 2019-11-18 LAB — PROTIME: Coagulation tissue factor induced:Time:Pt:PPP:Qn:Coag: 24.6 — ABNORMAL HIGH

## 2019-11-18 LAB — EOSINOPHILS RELATIVE PERCENT: Eosinophils/100 leukocytes:NFr:Pt:Bld:Qn:Automated count: 0.8

## 2019-11-18 LAB — CREATININE: Creatinine:MCnc:Pt:Ser/Plas:Qn:: 1.44 — ABNORMAL HIGH

## 2019-11-18 LAB — MAGNESIUM: Magnesium:MCnc:Pt:Ser/Plas:Qn:: 1.8

## 2019-11-19 DIAGNOSIS — E1142 Type 2 diabetes mellitus with diabetic polyneuropathy: Principal | ICD-10-CM

## 2019-11-19 LAB — TACROLIMUS BLOOD: Lab: 6.5

## 2019-11-19 MED ORDER — HUMULIN R U-500 (CONC) INSULIN KWIKPEN 500 UNIT/ML (3 ML) SUBCUTANEOUS
11 refills | 0 days
Start: 2019-11-19 — End: ?

## 2019-11-19 MED ORDER — WARFARIN 5 MG TABLET
ORAL_TABLET | 0 refills | 0 days | Status: CP
Start: 2019-11-19 — End: ?

## 2019-11-19 NOTE — Unmapped (Signed)
Patient is requesting the following refill  Requested Prescriptions     Pending Prescriptions Disp Refills   ??? warfarin (JANTOVEN) 5 MG tablet [Pharmacy Med Name: WARFARIN SODIUM 5 MG TAB] 30 tablet 0     Sig: TAKE 5 DAYS A WEEK       Order pended. Please advise. Thanks    Last OV: 09/21/2019   Next OV: Visit date not found

## 2019-12-03 NOTE — Unmapped (Signed)
[facility] Refill Coordination Note    Specialty Medication(s) to be Shipped:   Transplant: Myfortic 180mg  and tacrolimus 1mg     Other medication(s) to be shipped: none     David Ellis, DOB: 05/12/1946  Phone: [phone] (home)       All above HIPAA information was verified with patient.     Was a Nurse, learning disability used for this call? No    Completed refill call assessment today to schedule patient's medication shipment from the [facility] [phone]).       Specialty medication(s) and dose(s) confirmed: Regimen is correct and unchanged.   Changes to medications: David Ellis reports no changes at this time.  Changes to insurance: No  Questions for the pharmacist: No    Confirmed patient received Welcome Packet with first shipment. The patient will receive a drug information handout for each medication shipped and additional FDA Medication Guides as required.       DISEASE/MEDICATION-SPECIFIC INFORMATION        N/A    SPECIALTY MEDICATION ADHERENCE     Medication Adherence    Adherence tools used: patient uses a pill box to manage medications  Support network for adherence: family member                Myfortic 180 mg: 13 days of medicine on hand   Tacrolimus 1 mg: 13 days of medicine on hand *    SHIPPING     Shipping address confirmed in Epic.     Delivery Scheduled: Yes, Expected medication delivery date: 12/11/19.     Medication will be delivered via Same Day Courier to the prescription address in Epic WAM.    David Ellis   [facility] Specialty Pharmacist

## 2019-12-07 NOTE — Unmapped (Signed)
PCP:  [name], MD      ASSESSMENT/PLAN: [name] is a 74 y.o. 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    1. S/P LURD kidney transplant 2013 with baseline creatinine ~1.6-2.0,  His electrolytes are WNL. Tac troughs within goal of 4-7. Urine cytology for decoy cells was negative 08/2019. Has proteinuria which is not new, but has worsened since previously stopping losartan for low BPs. Restart today at 25mg  daily.     2. HTN -- Well controlled - BP in clinic today 131/72. Continue metoprolol and lasix. As per #1, starting losartan, which I think he will tolerate as his BPs have ranged 118-140/66-90 over the past few months.    3. T2DM. HbA1c 8.3 on 08/21/19. Continues to endorse episodes of low sugars. Pt was last seen by his endocrinologist on 06/30/19 and will f/u with her again in August. History of proliferative retinopathy s/p PPV/endolaser treatments, last evaluated by ophthalmology in 07/2019.     4. ASHD/CVA history. Continue pravastatin and ASA.     5. A-Fib. Continue coumadin. INR f/b Dr. Dellis Filbert.    6. HM: Had his covid vaccinations and is UTD on pneumonia and shinrix. Was due for colonoscopy but reportedly GI is leery of stopping anticoagulation so are following cologuard test results. No FH colon CA. Continue biannual follow-up with dermatology re: recurrent basal cell CA.     7. High falls risk: No recent falls. Has walker and cane but still very unstable with this and not getting out of the house because of this. Not discussed today.          DavidTasman A Motta will see me in 4 months.  ______________________________________________________________________________________________________    HPI: David Ellis is a 74 y.o. man s/p LURD kidney transplant (from his wife, David Ellis) 08/21/2011 and B/L creatinine ~1.4-1.7 who returns for f/u. 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     He has had multiple complications since his transplant including a large, provoked (long car ride) pulmonary embolus requiring thrombolysis (Dec 2014), osteomyelitis of his left foot requiring 4th/5th toe amputation (2016), recurrent basal cell CA, CVA s/p thombolysis (07/2015). He has significant PAD and neuropathy and has no sensation on either leg below his knees. He also has severe retinopathy and his vision is quite poor so no longer drives.     Interval history:     Patient continues to follow-up with cardiology, endocrinology, ophthalmology and internal medicine in the interim.    Today he reports low sugars and issues with maintaining increased BG levels. He has tried consuming orange juice, energy drinks, and other snacks with little resolution. Denies checking BP at home or any new skin lesions. No other complaints.       ROS: As per HPI. The remainder of the 10 system review is negative.    PAST MEDICAL HISTORY:  Past Medical History:   Diagnosis Date   ??? Atrial flutter (CMS-HCC)    ??? Diabetes mellitus (CMS-HCC)    ??? Diabetic nephropathy (CMS-HCC)    ??? Diabetic retinopathy (CMS-HCC)    ??? Fractures    ???  Ganglion cyst    ??? Hand injury    ??? Heart disease    ??? Hyperlipidemia    ??? Hypertension    ??? Joint pain    ??? Osteomyelitis (CMS-HCC) June 2016   ??? Pulmonary embolism (CMS-HCC) Dec 2015   ??? 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 Stroke (CMS-HCC)    ??? Tear of meniscus of knee    ??? Transplanted kidney 08/21/2011       ALLERGIES  Levofloxacin; Lidocaine; Penicillins; Egg derived; Enalapril; Epinephrine; Grass pollen-bermuda, standard; Lisinopril; and Mepivacaine hcl    MEDICATIONS:  Current Outpatient Medications   Medication Sig Dispense Refill   ??? aspirin (ECOTRIN) 81 MG tablet Take 81 mg by mouth continuous as needed. Only when flying and/or traveling by car more than 2 hours. (Patient not taking: Reported on 12/08/2019)     ??? blood-glucose meter kit Use as instructed. 1 each 0   ??? cholecalciferol, vitamin D3, (VITAMIN D3) 2,000 unit Tab Take 2,000 Units by mouth daily.      ??? furosemide (LASIX) 40 MG tablet Take 1-2 tablets daily as needed for edema. 180 tablet 3   ??? gabapentin (NEURONTIN) 300 MG capsule Take 1 capsule (300 mg total) by mouth Two (2) times a day. 180 each 3   ??? HUMULIN R U-500, CONC, KWIKPEN 500 unit/mL (3 mL) CONCENTRATED injection DIAL TO MAX 100 UNITS BEFORE BREAKFAST, LUNCH AND DINNER. INJECT 30 MINUTES BEFORE MEALS. MAX OF 300 UNITS DAILY. 18 mL 11   ??? hydrocortisone 2.5 % cream  (Patient not taking: Reported on 12/08/2019)  0   ??? ketoconazole (NIZORAL) 2 % cream  (Patient not taking: Reported on 12/08/2019)  0   ??? magnesium chloride (SLOW_MAG) 64 mg TbEC Take 128 mg by mouth three (3) times a day (at 6am, noon and 6pm).      ??? metoprolol succinate (TOPROL-XL) 100 MG 24 hr tablet Take 1 tablet (100 mg total) by mouth two (2) times a day. 180 tablet 3   ??? miscellaneous medical supply Misc 1 application by Miscellaneous route daily as needed. Lightweight wheelchair. 1 each 0   ??? miscellaneous medical supply Misc 1 application by Miscellaneous route nightly. Recommend CPAP 14 cm H2O with EPR 3 and heated humidifier for nasal dryness, mask: ResMed Activa Lt nasal mask (Large). 1 each 0   ??? multivitamin capsule Take 1 capsule by mouth daily.     ??? MYFORTIC 180 mg EC tablet Take 3 tablets (540 mg total) by mouth two (2) times a day. 180 tablet 35   ??? pen needle, diabetic (BD ULTRA-FINE NANO PEN NEEDLE) 32 gauge x 5/32 (4 mm) Ndle 1 each by Other route Three (3) times a day before meals. 300 each 2   ??? pravastatin (PRAVACHOL) 20 MG tablet TAKE 1 TABLET BY MOUTH DAILY 90 each 3   ??? tacrolimus (PROGRAF) 1 MG capsule Take 3 capsules (3mg ) by mouth in the morning and 2 capsules (2mg ) in the evening. 450 capsule 3   ??? TRULICITY 1.5 mg/0.5 mL PnIj INJECT 1.5MG (0.5ML) SUBCUTANEOUSLY ONCE A WEEK 2 mL 5   ??? warfarin (COUMADIN) 2.5 MG tablet TAKE ONE TABLET BY MOUTH EVERY MONDAY ORAS DIRECTED (Patient not taking: Reported on 12/08/2019) 30 each 12   ??? warfarin (JANTOVEN) 5 MG tablet TAKE 5 DAYS A WEEK 30 tablet 0     No current facility-administered medications for this visit.  Facility-Administered Medications Ordered in Other Visits   Medication Dose Route Frequency Provider Last Rate Last Admin   ??? bevacizumab    PRN (once a day) Melvyn Novas, MD   2.5 mg at 01/28/17 0750     Physical exam:   PHYSICAL EXAM:  Vitals:    12/08/19 0948   BP: 131/72   Pulse: 84   Temp: (P) 36.7 ??C (98.1 ??F)     CONSTITUTIONAL: Alert,well appearing, no distress  HEENT: wearing mask    NECK: Supple, no lymphadenopathy  CARDIOVASCULAR: Regular, normal S1/S2 heart sounds, no murmurs, no rubs.   PULM: Clear to auscultation bilaterally  GASTROINTESTINAL: Soft, active bowel sounds, nontender  EXTREMITIES: No lower extremity edema bilaterally  SKIN: No rashes or lesions  NEUROLOGIC: No focal motor or sensory deficits    MEDICAL DECISION MAKING    Results for orders placed or performed in visit on 11/18/19   Comprehensive Metabolic Panel   Result Value Ref Range    Sodium 138 135 - 145 mmol/L    Potassium 4.7 3.5 - 5.1 mmol/L    Chloride 103 98 - 107 mmol/L    Anion Gap 5 3 - 11 mmol/L    CO2 30.0 20.0 - 31.0 mmol/L    BUN 22 9 - 23 mg/dL    Creatinine 8.11 (H) 0.60 - 1.10 mg/dL    BUN/Creatinine Ratio 15     EGFR CKD-EPI Non-African American, Male 48 mL/min/1.38m2    EGFR CKD-EPI African American, Male 56 mL/min/1.75m2    Glucose 241 (H) 70 - 179 mg/dL    Calcium 9.0 8.7 - 91.4 mg/dL    Albumin 3.4 3.4 - 5.0 g/dL    Total Protein 6.6 5.7 - 8.2 g/dL    Total Bilirubin 0.7 0.3 - 1.2 mg/dL    AST 27 <78 U/L    ALT 20 10 - 49 U/L    Alkaline Phosphatase 108 46 - 116 U/L   PT-INR   Result Value Ref Range    PT 24.6 (H) 10.5 - 13.5 sec    INR 2.14    Tacrolimus Level, Timed   Result Value Ref Range    Tacrolimus, Timed 6.5 ng/mL   Magnesium Level   Result Value Ref Range    Magnesium 1.8 1.6 - 2.6 mg/dL   CBC w/ Differential   Result Value Ref Range    WBC 8.0 3.5 - 10.5 10*9/L    RBC 5.80 (H) 4.32 - 5.72 10*12/L    HGB 16.4 13.5 - 17.5 g/dL    HCT 29.5 62.1 - 30.8 %    MCV 83.4 81.0 - 95.0 fL    MCH 28.3 26.0 - 34.0 pg    MCHC 33.9 30.0 - 36.0 g/dL    RDW 65.7 84.6 - 96.2 %    MPV 7.7 7.0 - 10.0 fL    Platelet 197 150 - 450 10*9/L    nRBC 0 <=4 /100 WBCs    Neutrophils % 77.7 %    Lymphocytes % 12.7 %    Monocytes % 8.3 %    Eosinophils % 0.8 %    Basophils % 0.5 %    Absolute Neutrophils 6.2 1.7 - 7.7 10*9/L    Absolute Lymphocytes 1.0 0.7 - 4.0 10*9/L    Absolute Monocytes 0.7 0.1 - 1.0 10*9/L    Absolute Eosinophils 0.1 0.0 - 0.7 10*9/L    Absolute Basophils 0.0 0.0 - 0.1 10*9/L     *Note: Due to a large  number of results and/or encounters for the requested time period, some results have not been displayed. A complete set of results can be found in Results Review.        Scribe's Attestation: Trey Sailors, MD obtained and performed the history, physical exam and medical decision making elements that were entered into the chart.  Signed by Sharion Balloon, Scribe, on December 08, 2019 at 11:34 AM.    Attending Statement: Documentation assistance provided by the Scribe. I was present during the time the encounter was recorded. The information recorded by the Scribe was done at my direction and has been reviewed and validated by me.

## 2019-12-08 ENCOUNTER — Ambulatory Visit: Admit: 2019-12-08 | Discharge: 2019-12-08 | Payer: MEDICARE | Attending: Internal Medicine | Primary: Internal Medicine

## 2019-12-08 ENCOUNTER — Ambulatory Visit: Admit: 2019-12-08 | Discharge: 2019-12-08 | Payer: MEDICARE

## 2019-12-08 DIAGNOSIS — Z79899 Other long term (current) drug therapy: Principal | ICD-10-CM

## 2019-12-08 DIAGNOSIS — I10 Essential (primary) hypertension: Principal | ICD-10-CM

## 2019-12-08 DIAGNOSIS — I4891 Unspecified atrial fibrillation: Principal | ICD-10-CM

## 2019-12-08 DIAGNOSIS — E785 Hyperlipidemia, unspecified: Principal | ICD-10-CM

## 2019-12-08 DIAGNOSIS — R791 Abnormal coagulation profile: Principal | ICD-10-CM

## 2019-12-08 DIAGNOSIS — R609 Edema, unspecified: Principal | ICD-10-CM

## 2019-12-08 DIAGNOSIS — Z940 Kidney transplant status: Principal | ICD-10-CM

## 2019-12-08 DIAGNOSIS — T8610 Unspecified complication of kidney transplant: Principal | ICD-10-CM

## 2019-12-08 DIAGNOSIS — R801 Persistent proteinuria, unspecified: Principal | ICD-10-CM

## 2019-12-08 LAB — LYMPHOCYTES ABSOLUTE COUNT: Lymphocytes:NCnc:Pt:Bld:Qn:Automated count: 1.2

## 2019-12-08 LAB — PROTIME-INR: PROTIME: 28.8 s — ABNORMAL HIGH (ref 10.5–13.5)

## 2019-12-08 LAB — COMPREHENSIVE METABOLIC PANEL
ALBUMIN/GLOBULIN RATIO: 0.8 — ABNORMAL LOW (ref 1.1–2.2)
ALBUMIN: 3.2 g/dL — ABNORMAL LOW (ref 3.5–5.0)
ALKALINE PHOSPHATASE: 89 U/L (ref 50–136)
ALT (SGPT): 28 U/L (ref 12–78)
ANION GAP: 6 mmol/L
AST (SGOT): 26 U/L (ref 7–31)
BILIRUBIN TOTAL: 0.7 mg/dL (ref 0.0–1.5)
BLOOD UREA NITROGEN: 27 mg/dL — ABNORMAL HIGH (ref 5–26)
BUN / CREAT RATIO: 18 (ref 12–25)
CALCIUM: 8.5 mg/dL (ref 8.5–10.5)
CHLORIDE: 106 mmol/L (ref 95–110)
CO2: 30.7 mmol/L (ref 21.0–31.0)
EGFR CKD-EPI AA MALE: 55 mL/min/{1.73_m2} — ABNORMAL LOW (ref >=60)
EGFR CKD-EPI NON-AA MALE: 47 mL/min/{1.73_m2} — ABNORMAL LOW (ref >=60)
GLOBULIN, TOTAL: 3.8 g/dL (ref 1.5–4.3)
GLUCOSE RANDOM: 215 mg/dL — ABNORMAL HIGH (ref 60–100)
POTASSIUM: 4.4 mmol/L (ref 3.5–5.1)
PROTEIN TOTAL: 7 g/dL (ref 6.4–8.3)
SODIUM: 143 mmol/L (ref 136–145)

## 2019-12-08 LAB — CBC W/ AUTO DIFF
HEMOGLOBIN: 16.3 g/dL (ref 12.2–17.1)
LYMPHOCYTES ABSOLUTE COUNT: 1.2 10*9/L (ref 1.0–4.8)
LYMPHOCYTES RELATIVE PERCENT: 15 %
MEAN CORPUSCULAR HEMOGLOBIN CONC: 31.6 g/dL (ref 31.0–36.0)
MEAN CORPUSCULAR HEMOGLOBIN: 27.4 pg (ref 26.0–32.0)
MEAN PLATELET VOLUME: 7 fL (ref 6.5–8.9)
MONOCYTES ABSOLUTE COUNT: 0.4 10*9/L (ref 0.0–0.8)
MONOCYTES RELATIVE PERCENT: 5.8 %
NEUTROPHILS ABSOLUTE COUNT: 6.7 10*9/L (ref 1.8–7.0)
PLATELET COUNT: 195 10*9/L (ref 140–440)
RED BLOOD CELL COUNT: 5.96 10*12/L — ABNORMAL HIGH (ref 4.20–5.80)
RED CELL DISTRIBUTION WIDTH: 14.5 % (ref 11.0–15.0)
WBC ADJUSTED: 8.3 10*9/L (ref 3.8–10.8)

## 2019-12-08 LAB — INR: Coagulation tissue factor induced.INR:RelTime:Pt:PPP:Qn:Coag: 2.53

## 2019-12-08 LAB — GLOBULIN, TOTAL: Chemistry studies:Cmplx:-:^Patient:Set:: 3.8

## 2019-12-08 LAB — MAGNESIUM: Magnesium:MCnc:Pt:Ser/Plas:Qn:: 2

## 2019-12-08 MED ORDER — LOSARTAN 25 MG TABLET
ORAL_TABLET | Freq: Every day | ORAL | 3 refills | 90.00000 days | Status: CP
Start: 2019-12-08 — End: 2020-12-07

## 2019-12-08 NOTE — Unmapped (Signed)
Start losartan 25mg  once daily for proteinuria.

## 2019-12-08 NOTE — Unmapped (Signed)
Assessment and Plan:   David Ellis is a 74 y.o. 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    1. Atrial fibrillation  Symptomatically doing well since last visit.  Zio patch 02/2019 without recurrent atrial fibrillation.  Continue metoprolol at current dose.  Previously reviewed anticoagulation options and he has preferred to stay on warfarin.  Currently tolerating this well, continue current therapy.    2. Hypertension  Blood pressure well controlled.  Reviewed CMP from June 2021 showing stable electrolytes.  Creatinine slightly up compared to previous, reportedly has repeat lab draw today and follows up with nephrology as well.  Plan to continue current therapy from my standpoint.    3. Hyperlipidemia  Reviewed recent lipids 08/2019.  LDL very well controlled, symptoms are stable.  Continue current regimen with furosemide daily with additional dose as needed.  non-HDL slightly greater than 161.  For now, plan to continue current therapy.  We will continue to readdress moving forward.    4.  Edema  Symptoms are stable.  Continue furosemide daily with additional dose as needed.        Vevelyn Francois, MD  [facility] Card[facility]  Pager [phone]      Subjective:   PCP: [name], MD  Patient: David Ellis  DOB: 10-Aug-1945    Reason for visit:  Afib, HTN, RV failure  HPI: David Ellis is a 74 y.o. 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  Since his last visit, he overall reports feeling very well.  Has not noticed any problems with recurrent atrial fibrillation.  He accidentally drank something with caffeine recently and did not notice any palpitations whatsoever.  Also reports swelling has been stable.  Has used 1 dose of as needed furosemide recently.  Denies any chest pain, shortness of breath, near syncope, or other pertinent complaints today.  He was able to go out and play a few holes of golf recently.  Does report balance continues to be an issue.    ______________________________________________________________________  Pertinent Medical History, Cardiovascular History & Procedures:    Pertinent PMH:   ?? AFib  ?? Typical atrial flutter s/p ablation 06/2011  ?? DM  ?? 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 ESRD s/p transplant  ?? HTN  ?? OSA on CPAP  ?? Skin CA  ?? PE s/p catheter directed lytics 05/2013  ?? 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 Wedge defects in left lung without any thrombus visualized, could represent chronic changes or small subsegmental PEs.    Risk Factors: HTN, DM     Cath / PCI:   ?? None    CV Surgery:   ?? None    EP Procedures and Devices:   ?? None    Non-Invasive Evaluation(s):   ?? Ziopatch 9/20: Predominantly sinus rhythm with rates 57 to 100 bpm, average heart rate 75 bpm.  71 episodes of SVT, longest lasting 17 beats at 91 bpm.  Rare PVCs, brief idioventricular rhythm noted.  No wide-complex tachycardia 2.5% PVC burden.  ?? TTE 3/17: Normal EF, LVH, grade 1 diastolic dysfunction  ?? Ziopatch 2/16: Rare SVE  and VE. 29 episodes of SVT, longest episode lasting 20 beats. No atrial fibrillation identified.  ?? TTE from 06/30/13: Normal EF 55-60%, mildly dilated RV, mild RV dysfunction.  ?? Nuclear stress test 07/2011:  ?? Normal myocardial perfusion study  ?? No evidence for significant ischemia or scar EF 62% and RV is mildly dilated.  ?? Minimal coronary calcification is noted involving the LAD and RCA    ______________________________________________________________________    Other past medical history, social history, family history, medications, allergies and problem list reviewed in the medical record.    Current cardiac medications include:   ?? Metoprolol 100 mg twice daily  ?? 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 Furosemide 40 mg once daily with additional dose as needed  ?? Warfarin  ?? Pravastatin 20 mg daily      Objective:     BP 118/66  - Pulse 83  - Temp 36.7 ??C (98 ??F)  - Ht 185.4 cm (6' 1)  - Wt (!) 116.6 kg (257 lb)  - SpO2 96%  - BMI 33.91 kg/m??     PHYSICAL EXAMINATION:   GENERAL:  Alert, NAD  ENT: Wearing a mask  CARDIOVASCULAR:  Regular rate and rhythm, soft S1/S2, no murmurs, rubs, or gallops.  1+ left lower extremity, trace right lower extremity edema.  RESPIRATORY:  Clear to auscultation bilaterally.  No wheezes, crackles, or rhonchi. Normal work of breathing.        ______________________________________________________________________  EKG 06/2019 shows normal sinus rhythm, left anterior fascicular block, first-degree AV block, nonspecific T wave changes    Recent CV pertinent labs:  Lab Results   Component Value Date    LDL Calculated 66 08/21/2019    LDL Calculated 74 04/21/2015    Non-HDL Cholesterol 103 08/21/2019    HDL 32 (L) 08/21/2019    HDL 36 (L) 04/21/2015    INR, POC 3.09 (A) 05/19/2019    INR 2.14 11/18/2019    INR 1.9 (A) 06/02/2019    PRO-BNP 375 (H) 06/29/2014    Creatinine 1.44 (H) 11/18/2019    Creatinine 1.7 (H) 07/09/2017    Creatinine 1.36 08/09/2014    Potassium 4.7 11/18/2019    Potassium 4.6 07/09/2017    BUN 22 11/18/2019    BUN 49.00 (H) 07/09/2017    TSH 2.202 07/02/2017    TSH 1.20 07/02/2011       Chemistry        Component Value Date/Time    NA 138 11/18/2019 1132    NA 139 07/09/2017 1358    K 4.7 11/18/2019 1132    K 4.6 07/09/2017 1358    CL 103 11/18/2019 1132    CL 104 07/09/2017 1358    CO2 30.0 11/18/2019 1132    CO2 24.2 07/09/2017 1358    BUN 22 11/18/2019 1132    BUN 49.00 (H) 07/09/2017 1358    CREATININE 1.44 (H) 11/18/2019 1132    CREATININE 1.7 (H) 07/09/2017 1358    GLU 241 (H) 11/18/2019 1132        Component Value Date/Time    CALCIUM 9.0 11/18/2019 1132    CALCIUM 8.9 07/09/2017 1358    ALKPHOS 108 11/18/2019 1132    ALKPHOS 91 04/21/2015 0858    AST 27 11/18/2019 1132    AST 24 04/21/2015 0858    ALT 20 11/18/2019 1132    ALT 22 04/21/2015 0858    BILITOT 0.7 11/18/2019 1132    BILITOT 0.5 04/21/2015  0858

## 2019-12-08 NOTE — Unmapped (Signed)
AOBP   Patient's blood pressure was taken on left upper arm, large cuff.   1st reading 137/71, pulse: 86  2nd reading 128/73, pulse: 83  3rd reading 129/72, pulse: 82  Average reading 131/72, pulse: 84

## 2019-12-09 LAB — TACROLIMUS BLOOD: Lab: 5.1

## 2019-12-11 MED FILL — MYFORTIC 180 MG TABLET,DELAYED RELEASE: ORAL | 30 days supply | Qty: 180 | Fill #12

## 2019-12-11 MED FILL — TACROLIMUS 1 MG CAPSULE, IMMEDIATE-RELEASE: 30 days supply | Qty: 150 | Fill #9 | Status: AC

## 2019-12-11 MED FILL — MYFORTIC 180 MG TABLET,DELAYED RELEASE: 30 days supply | Qty: 180 | Fill #12 | Status: AC

## 2019-12-11 MED FILL — TACROLIMUS 1 MG CAPSULE, IMMEDIATE-RELEASE: 30 days supply | Qty: 150 | Fill #9

## 2019-12-18 MED ORDER — WARFARIN 5 MG TABLET
ORAL_TABLET | 6 refills | 0 days | Status: CP
Start: 2019-12-18 — End: ?

## 2019-12-18 NOTE — Unmapped (Signed)
Patient is requesting the following refill  Requested Prescriptions     Pending Prescriptions Disp Refills   ??? warfarin (JANTOVEN) 5 MG tablet [Pharmacy Med Name: WARFARIN SODIUM 5 MG TAB] 30 tablet 0     Sig: TAKE 5 DAYS A WEEK       Order pended. Please advise. Thanks    Last OV: 09/21/2019   Next OV: Visit date not found

## 2019-12-31 DIAGNOSIS — Z940 Kidney transplant status: Principal | ICD-10-CM

## 2019-12-31 MED ORDER — MYFORTIC 180 MG TABLET,DELAYED RELEASE
ORAL_TABLET | Freq: Two times a day (BID) | ORAL | 3 refills | 90 days | Status: CP
Start: 2019-12-31 — End: 2020-12-30
  Filled 2020-01-05: qty 180, 30d supply, fill #0

## 2019-12-31 NOTE — Unmapped (Signed)
[facility] Refill Coordination Note    Specialty Medication(s) to be Shipped:   Transplant: Myfortic 180mg  and tacrolimus 1mg     Other medication(s) to be shipped: No additional medications requested for fill at this time     David Ellis, DOB: September 18, 1945  Phone: [phone] (home)       All above HIPAA information was verified with patient.     Was a Nurse, learning disability used for this call? No    Completed refill call assessment today to schedule patient's medication shipment from the [facility] [phone]).       Specialty medication(s) and dose(s) confirmed: Regimen is correct and unchanged.   Changes to medications: Constantin reports no changes at this time.  Changes to insurance: No  Questions for the pharmacist: No    Confirmed patient received Welcome Packet with first shipment. The patient will receive a drug information handout for each medication shipped and additional FDA Medication Guides as required.       DISEASE/MEDICATION-SPECIFIC INFORMATION        N/A    SPECIALTY MEDICATION ADHERENCE     Medication Adherence    Patient reported X missed doses in the last month: 0  Specialty Medication: Myfortic 180mg   Patient is on additional specialty medications: Yes  Additional Specialty Medications: Tacrolimus 1mg   Patient Reported Additional Medication X Missed Doses in the Last Month: 0  Patient is on more than two specialty medications: No  Adherence tools used: patient uses a pill box to manage medications  Support network for adherence: family member                Myforitc 180 mg: 7 days of medicine on hand   Tacrolimus 1 mg: 7 days of medicine on hand        SHIPPING     Shipping address confirmed in Epic.     Delivery Scheduled: Yes, Expected medication delivery date: 01/05/20.     Medication will be delivered via Same Day Courier to the prescription address in Epic WAM.    [name]   [facility] Specialty Pharmacist

## 2020-01-05 ENCOUNTER — Encounter: Payer: Self-pay | Admitting: Dermatology

## 2020-01-05 ENCOUNTER — Ambulatory Visit (INDEPENDENT_AMBULATORY_CARE_PROVIDER_SITE_OTHER): Payer: Medicare Other | Admitting: Dermatology

## 2020-01-05 ENCOUNTER — Other Ambulatory Visit: Payer: Self-pay

## 2020-01-05 DIAGNOSIS — D229 Melanocytic nevi, unspecified: Secondary | ICD-10-CM | POA: Diagnosis not present

## 2020-01-05 DIAGNOSIS — Z85828 Personal history of other malignant neoplasm of skin: Secondary | ICD-10-CM | POA: Diagnosis not present

## 2020-01-05 DIAGNOSIS — L814 Other melanin hyperpigmentation: Secondary | ICD-10-CM

## 2020-01-05 DIAGNOSIS — L578 Other skin changes due to chronic exposure to nonionizing radiation: Secondary | ICD-10-CM

## 2020-01-05 DIAGNOSIS — L853 Xerosis cutis: Secondary | ICD-10-CM

## 2020-01-05 DIAGNOSIS — Z1283 Encounter for screening for malignant neoplasm of skin: Secondary | ICD-10-CM | POA: Diagnosis not present

## 2020-01-05 DIAGNOSIS — D1800 Hemangioma unspecified site: Secondary | ICD-10-CM

## 2020-01-05 DIAGNOSIS — L820 Inflamed seborrheic keratosis: Secondary | ICD-10-CM

## 2020-01-05 DIAGNOSIS — D225 Melanocytic nevi of trunk: Secondary | ICD-10-CM

## 2020-01-05 DIAGNOSIS — L821 Other seborrheic keratosis: Secondary | ICD-10-CM

## 2020-01-05 MED FILL — MYFORTIC 180 MG TABLET,DELAYED RELEASE: 30 days supply | Qty: 180 | Fill #0 | Status: AC

## 2020-01-05 MED FILL — TACROLIMUS 1 MG CAPSULE, IMMEDIATE-RELEASE: 30 days supply | Qty: 150 | Fill #10

## 2020-01-05 MED FILL — TACROLIMUS 1 MG CAPSULE, IMMEDIATE-RELEASE: 30 days supply | Qty: 150 | Fill #10 | Status: AC

## 2020-01-05 NOTE — Progress Notes (Signed)
Follow-Up Visit   Subjective  Larry Bright is a 74 y.o. male who presents for the following: TBSE.  Patient presents today for annual TBSE, has no concerns, does have h/o BCC and SCC.  He has an itchy spot on his back.  He is a renal transplant patient.  The following portions of the chart were reviewed this encounter and updated as appropriate:      Review of Systems:  No other skin or systemic complaints except as noted in HPI or Assessment and Plan.  Objective  Well appearing patient in no apparent distress; mood and affect are within normal limits.  A full examination was performed including scalp, head, eyes, ears, nose, lips, neck, chest, axillae, abdomen, back, buttocks, bilateral upper extremities, bilateral lower extremities, hands, feet, fingers, toes, fingernails, and toenails. All findings within normal limits unless otherwise noted below.  Objective  Left Medial Cheek, R nasal dorsum: Well healed scar with no evidence of recurrence.   Objective  Left Antihelix: Well healed scar with no evidence of recurrence  Objective  Left Ear Lobe, Right Lateral Eyebrow, Right spinal mid upper back x 3 (3): Erythematous keratotic or waxy stuck-on papule or plaque.   Objective  Right Lower Flank: 6x4 mm med dark brown waxy macule  Objective  B/L leg: Xerotic patches   Assessment & Plan  History of basal cell carcinoma (BCC) Left Medial Cheek, R nasal dorsum  Treated with Moh's Clear. Observe for recurrence. Call clinic for new or changing lesions.  Recommend regular skin exams, daily broad-spectrum spf 30+ sunscreen use, and photoprotection.     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 Observe for recurrence. Call clinic for new or changing lesions.  Recommend regular skin exams, daily broad-spectrum spf 30+ sunscreen use, and photoprotection.     Inflamed seborrheic keratosis (5) Left Ear Lobe; Right  spinal mid upper back x 3 (3); Right Lateral Eyebrow  Cryotherapy today Prior to procedure, discussed risks of blister formation, small wound, skin dyspigmentation, or rare scar following cryotherapy.    Destruction of lesion - Right spinal mid upper back x 3  Destruction method: cryotherapy   Informed consent: discussed and consent obtained   Lesion destroyed using liquid nitrogen: Yes   Region frozen until ice ball extended beyond lesion: Yes   Outcome: patient tolerated procedure well with no complications   Post-procedure details: wound care instructions given    Nevus Right Lower Flank  Nevus vs sk Benign-appearing.  Observation.  Call clinic for new or changing moles.  Recommend daily use of broad spectrum spf 30+ sunscreen to sun-exposed areas.     Xerosis cutis B/L leg  Recommend mild soap and routine use of moisturizing cream  Recommend Gold Bond Rough and Bumpy cream or CeraVe SA cream   Lentigines - Scattered tan macules - Discussed due to sun exposure - Benign, observe - Call for any changes  Seborrheic Keratoses - Stuck-on, waxy, tan-brown papules and plaques  - Discussed benign etiology and prognosis. - Observe - Call for any changes  Melanocytic Nevi - Tan-brown and/or pink-flesh-colored symmetric macules and papules - Benign appearing on exam today - Observation - Call clinic for new or changing moles - Recommend daily use of broad spectrum spf 30+ sunscreen to sun-exposed areas.   Hemangiomas - Red papules - Discussed benign nature - Observe - Call for any changes  Actinic Damage - diffuse scaly erythematous macules with underlying dyspigmentation -  Recommend daily broad spectrum sunscreen SPF 30+ to sun-exposed areas, reapply every 2 hours as needed.  - Call for new or changing lesions.  Xerosis - diffuse xerotic patches - recommend gentle, hydrating skin care - gentle skin care handout given   Skin cancer screening performed  today.   Return in about 6 months (around 07/07/2020) for UBSE.  I, Donzetta Kohut, CMA, am acting as scribe for Brendolyn Patty, MD .  Documentation: I have reviewed the above documentation for accuracy and completeness, and I agree with the above.  Brendolyn Patty MD

## 2020-01-05 NOTE — Patient Instructions (Addendum)
Recommend daily broad spectrum sunscreen SPF 30+ to sun-exposed areas, reapply every 2 hours as needed. Call for new or changing lesions.  Prior to procedure, discussed risks of blister formation, small wound, skin dyspigmentation, or rare scar following cryotherapy.  Liquid nitrogen was applied for 10-12 seconds to the skin lesion and the expected blistering or scabbing reaction explained. Do not pick at the area. Patient reminded to expect hypopigmented scars from the procedure. Return if lesion fails to fully resolve.  Cryotherapy Aftercare  . Wash gently with soap and water everyday.   Marland Kitchen Apply Vaseline and Band-Aid daily until healed.  Seborrheic Keratosis  What causes seborrheic keratoses? Seborrheic keratoses are harmless, common skin growths that first appear during adult life.  As time goes by, more growths appear.  Some people may develop a large number of them.  Seborrheic keratoses appear on both covered and uncovered body parts.  They are not caused by sunlight.  The tendency to develop seborrheic keratoses can be inherited.  They vary in color from skin-colored to gray, brown, or even black.  They can be either smooth or have a rough, warty surface.   Seborrheic keratoses are superficial and look as if they were stuck on the skin.  Under the microscope this type of keratosis looks like layers upon layers of skin.  That is why at times the top layer may seem to fall off, but the rest of the growth remains and re-grows.    Treatment Seborrheic keratoses do not need to be treated, but can easily be removed in the office.  Seborrheic keratoses often cause symptoms when they rub on clothing or jewelry.  Lesions can be in the way of shaving.  If they become inflamed, they can cause itching, soreness, or burning.  Removal of a seborrheic keratosis can be accomplished by freezing, burning, or surgery. If any spot bleeds, scabs, or grows rapidly, please return to have it checked, as these can be  an indication of a skin cancer.  Gentle Skin Care Guide  1. Bathe no more than once a day.  2. Avoid bathing in hot water  3. Use a mild soap like Dove, Vanicream, Cetaphil, CeraVe. Can use Lever 2000 or Cetaphil antibacterial soap  4. Use soap only where you need it. On most days, use it under your arms, between your legs, and on your feet. Let the water rinse other areas unless visibly dirty.  5. When you get out of the bath/shower, use a towel to gently blot your skin dry, don't rub it.  6. While your skin is still a little damp, apply a moisturizing cream such as Vanicream, CeraVe, Cetaphil, Eucerin, Sarna lotion or plain Vaseline Jelly. For hands apply Neutrogena [place] Hand Cream or Excipial Hand Cream.  7. Reapply moisturizer any time you start to itch or feel dry.  8. Sometimes using free and clear laundry detergents can be helpful. Fabric softener sheets should be avoided. Downy Free & Gentle liquid, or any liquid fabric softener that is free of dyes and perfumes, it acceptable to use  9. If your doctor has given you prescription creams you may apply moisturizers over them

## 2020-01-07 ENCOUNTER — Ambulatory Visit: Admit: 2020-01-07 | Discharge: 2020-01-08 | Payer: MEDICARE

## 2020-01-07 LAB — CBC W/ AUTO DIFF
BASOPHILS ABSOLUTE COUNT: 0 10*9/L (ref 0.0–0.1)
BASOPHILS RELATIVE PERCENT: 0.7 %
EOSINOPHILS ABSOLUTE COUNT: 0.1 10*9/L (ref 0.0–0.7)
HEMATOCRIT: 46.2 % (ref 38.0–50.0)
HEMOGLOBIN: 15.5 g/dL (ref 13.5–17.5)
LYMPHOCYTES ABSOLUTE COUNT: 1.3 10*9/L (ref 0.7–4.0)
LYMPHOCYTES RELATIVE PERCENT: 18.5 %
MEAN CORPUSCULAR HEMOGLOBIN: 28.3 pg (ref 26.0–34.0)
MEAN CORPUSCULAR VOLUME: 84.4 fL (ref 81.0–95.0)
MEAN PLATELET VOLUME: 7.6 fL (ref 7.0–10.0)
MONOCYTES ABSOLUTE COUNT: 0.6 10*9/L (ref 0.1–1.0)
NEUTROPHILS ABSOLUTE COUNT: 5.1 10*9/L (ref 1.7–7.7)
NUCLEATED RED BLOOD CELLS: 0 /100{WBCs} (ref <=4)
PLATELET COUNT: 193 10*9/L (ref 150–450)
RED BLOOD CELL COUNT: 5.48 10*12/L (ref 4.32–5.72)
RED CELL DISTRIBUTION WIDTH: 14.6 % (ref 12.0–15.0)
WBC ADJUSTED: 7.1 10*9/L (ref 3.5–10.5)

## 2020-01-07 LAB — COMPREHENSIVE METABOLIC PANEL
ALBUMIN: 3.5 g/dL (ref 3.4–5.0)
ALKALINE PHOSPHATASE: 97 U/L (ref 46–116)
ANION GAP: 4 mmol/L — ABNORMAL LOW (ref 5–14)
AST (SGOT): 24 U/L (ref <=34)
BLOOD UREA NITROGEN: 23 mg/dL (ref 9–23)
BUN / CREAT RATIO: 18
CALCIUM: 9.1 mg/dL (ref 8.7–10.4)
CHLORIDE: 106 mmol/L (ref 98–107)
CO2: 31.2 mmol/L — ABNORMAL HIGH (ref 20.0–31.0)
EGFR CKD-EPI AA MALE: 65 mL/min/{1.73_m2} (ref >=60)
EGFR CKD-EPI NON-AA MALE: 57 mL/min/{1.73_m2} — ABNORMAL LOW (ref >=60)
GLUCOSE RANDOM: 193 mg/dL — ABNORMAL HIGH (ref 70–179)
POTASSIUM: 4.7 mmol/L — ABNORMAL HIGH (ref 3.4–4.5)
PROTEIN TOTAL: 6.7 g/dL (ref 5.7–8.2)
SODIUM: 141 mmol/L (ref 135–145)

## 2020-01-07 LAB — POTASSIUM: Potassium:SCnc:Pt:Ser/Plas:Qn:: 4.7 — ABNORMAL HIGH

## 2020-01-07 LAB — PROTIME-INR: INR: 2.69

## 2020-01-07 LAB — MEAN PLATELET VOLUME: Platelet mean volume:EntVol:Pt:Bld:Qn:Automated count: 7.6

## 2020-01-07 LAB — PROTIME: Coagulation tissue factor induced:Time:Pt:PPP:Qn:Coag: 30.5 — ABNORMAL HIGH

## 2020-01-07 LAB — MAGNESIUM
MAGNESIUM: 2.1 mg/dL (ref 1.6–2.6)
Magnesium:MCnc:Pt:Ser/Plas:Qn:: 2.1

## 2020-01-07 NOTE — Unmapped (Signed)
Patient called after his lab work this morning and was notified by the lab that his PT/INR standing order is no longer in date and he will need a new order for further blood work. Please advise, Thanks!

## 2020-01-08 LAB — TACROLIMUS BLOOD: Lab: 4.2

## 2020-01-11 NOTE — Unmapped (Signed)
Inform that standing order renewed.

## 2020-01-15 MED ORDER — PRAVASTATIN 20 MG TABLET
ORAL_TABLET | Freq: Every day | ORAL | 1 refills | 90 days | Status: CP
Start: 2020-01-15 — End: ?

## 2020-01-18 DIAGNOSIS — G629 Polyneuropathy, unspecified: Principal | ICD-10-CM

## 2020-01-18 MED ORDER — GABAPENTIN 300 MG CAPSULE
ORAL_CAPSULE | Freq: Two times a day (BID) | ORAL | 0 refills | 90 days | Status: CP
Start: 2020-01-18 — End: 2021-01-17

## 2020-01-18 NOTE — Unmapped (Signed)
Per last MD note pt to continue taking Gabapentin 300 mg tablets BID. Refilled #[id number] with 0 refills to get pt to next appt. in 04/2020

## 2020-01-26 ENCOUNTER — Ambulatory Visit: Admit: 2020-01-26 | Discharge: 2020-01-27 | Payer: MEDICARE

## 2020-01-26 DIAGNOSIS — H4312 Vitreous hemorrhage, left eye: Principal | ICD-10-CM

## 2020-01-26 NOTE — Unmapped (Signed)
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  I reviewed the resident???s note.  I agree with the resident???s findings and plan including interpretation of images.     EXTENDED OPHTHALMOSCOPY RESULTS:  OD: MAs, PRP 360  OS: MAs, PRP 360

## 2020-01-27 ENCOUNTER — Ambulatory Visit: Admit: 2020-01-27 | Discharge: 2020-01-27 | Payer: MEDICARE

## 2020-01-27 ENCOUNTER — Ambulatory Visit: Admit: 2020-01-27 | Discharge: 2020-01-27 | Payer: MEDICARE | Attending: "Endocrinology | Primary: "Endocrinology

## 2020-01-27 NOTE — Unmapped (Addendum)
Congratulations on 66% of glucoses at [facility]!    Documentation for Medicare so that you can continue to receive Freestyle Libre #1  via [facility].     Maintain Trulicity 1.5mg  weekly.  Will not increase further.     Bfast  19 clicks (95units).  Lunch 8 clicks (45units)  Dinner 3-6 clicks (15-30units).   If glucose is < 80 before the meal, reduces dose by 50%.   If glucose is <150 at bedtime, have low carb snack such as handful nuts, piece cheese, spoon of PNbutter.     Upstairs for Booster today.

## 2020-01-27 NOTE — Unmapped (Signed)
ASSESSMENT / PLAN  1. 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)  2. Hypoglycemia due to insulin  Improved control in setting of u500 insulin, GLP1a, significant weight loss of up to 40lbs, and use of Libre CGM (he prefers #1 with less alerts).   On review of downloaded CGM for the past 28 days:  Congratulations on 66% of glucoses at target!   Need to focus on reduction of 8% hypoglycemia typically between 3-6am which seems to occur when he goes to bed with a normal glucose (vs more typicaly hyperglycemia at bedtime).  Suspect this is combination of occasional lower CHO meals leading to more normal PM glucoses, trulicity effects on glucose; weight loss increasing his insulin sensitivity, and longer action of u500 insulin leading to stacking of doses overnight.  Hesitant to reduce daytime u500 for now because most days he still has bedtime hyperglycemia.    Instead, discussed strategies to aim for glucoses mid to upper 100s prior to bed to reduce risk noctural hypoglycemia.  If this continues, will reduce evening coverage of u500.    Reviewed lag time of POC glucose to CGM interstitial glucose.  Calibrate while fasting.      Maintain Trulicity 1.5mg  weekly.  Will not increase further with already significant weight loss of 40 lbs.     Bfast  19 clicks (95units).  Lunch 8 clicks (45units)  Dinner 3-6 clicks (15-30units).   If glucose is < 80 before the meal, reduces dose by 50%.   If glucose is <150 at bedtime, have low carb snack such as handful nuts, piece cheese, spoon of PNbutter.     Upstairs for Booster today in setting of immunosuppresion.    Foot exam deferred with recent exam.     MEDICARE:  *The patient is currently using the therapeutic CGM as prescribed.  *The patient has diabetes.  *The patient is insulin-treated with 3 or more daily administrations of insulin (MDII) or a continuous subcutaneous insulin infusion (CSII) pump prior to use of a CGM.   *The patient???s insulin treatment regimen requires frequent adjustments by the patient based on sliding scale.  *Within the last six months, I had an in-person visit with the beneficiary to evaluate their diabetes control and determine that the above criteria are met.   *Every six months I will continue to have an in-person visit with the beneficiary to assess adherence to their CGM regimen and diabetes treatment plan.        Return in about 6 months (around 07/29/2020).     SUBJECTIVE  PCP: [name]  Tplant: Amy Mottle  Optho:  Herma Ard  Wound: Keagy  Podiatry: Dr. Ether Griffins, [facility]   Derm: Davy Pique, [facility]    HPI: David Ellis is a 74 y.o. male who I last saw 06/2019 returns for follow up of T2DM with hx of kidney transplant.  Is losing weight (270 --> 230s lbs) he attributes to less appetite on Trulicity.     Maintained on u500 insulin as adjusted last visit, Trulicity with assitance from [employer] CGM.   [employer]. He is concerned that our POC glucose was 173, Libre glucose 144.   He is not fasting. Reviewed lag from blood to interstitial glucose, and improved concordance when fasting.  Continues Trulicity 1.5mg  weekly.  He has good bfast, OK lunch, often not much appetite for dinner.   Noted further 20 lb wt loss since 06/2019 (257 lb to 237 lbs). Not exercising with gym  closed.   No strong indication to increase Trulicity further. Affordable for now, but when in doughnut hole, $$. Discussed that I think a large part of his weight loss is related to Trulicity.   u500 insulin to legs with stomach largely healed.        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 Optho 01/2020.  CKD followed by Nephrology. Has restarted ARB.  Peripheral neuropathy w hx of amputation.  On Neurontin. Has recently obtained new DM shoes. Foot exam deffered  BP tightly controlled. Tolerating restart of low dose ARB.  Lipids on low dose pravastatin 20.      Endocrine Obtained / Updated Problem List:   1. T2DM dx 1992, insulin roughly 2000:   ?? Proliferative DR s/p hx vitreous hemorrhage and endolaser therapy. Exam 01/2020.  ?? CKD s/p renal transplant  ?? 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 No monofilament 06/2019  ?? Hypoglycemic emergency requiring EMS 03/2011.   ?? Historically, Victoza, Ozempic, Trulicity were not covered or too $$.   ?? Offered to check cpeptide to see if he would qualify for insulin pump under medcare, though he is not interested.    ?? Off MTF since end of 2015/early 2016.   2. 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   3. HTN   4. Nephrolithiasis   5. OSA on cpap   6. Aflutter s/p ablation 06/2011, atrial fibrillation  7. PE    Patient Active Problem List   Diagnosis   ??? Type II diabetes mellitus with renal manifestations, uncontrolled (CMS-HCC)   ??? Essential (primary) hypertension   ??? History of kidney transplant   ??? Mixed hyperlipidemia   ??? 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 Transplanted kidney   ??? Other pulmonary embolism without acute cor pulmonale (CMS-HCC)   ??? Edema   ??? Right ventricular dysfunction   ??? Atrial fibrillation (CMS-HCC)   ??? Aftercare following organ transplant   ??? Diverticulosis of colon   ??? Routine general medical examination at a health care facility   ??? Diabetic polyneuropathy associated with type 2 diabetes mellitus (CMS-HCC)   ??? Diabetic ulcer of left foot associated with type 2 diabetes mellitus (CMS-HCC)   ??? Toe amputation status   ??? On statin therapy due to risk of future cardiovascular event   ??? Left knee pain   ??? BCC (basal cell carcinoma), face   ??? Acquired absence of other toe(s), unspecified side (CMS-HCC)   ??? History of colonic polyps   ??? Type 2 diabetes mellitus with foot ulcer (CODE) (CMS-HCC)   ??? 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 Hypoglycemia associated with type 2 diabetes mellitus (CMS-HCC)   ??? PAD (peripheral artery disease) (CMS-HCC) ??? Class 2 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   ??? Immunosuppressed status (CMS-HCC)   ??? Syncope     Past Medical History:   Diagnosis Date   ??? Atrial flutter (CMS-HCC)    ??? Diabetes mellitus (CMS-HCC)    ??? Diabetic nephropathy (CMS-HCC)    ??? Diabetic retinopathy (CMS-HCC)    ??? Fractures    ??? Ganglion cyst    ??? Hand injury    ??? Heart disease    ??? Hyperlipidemia    ??? Hypertension    ??? Joint pain    ??? Osteomyelitis (CMS-HCC) June 2016   ??? Pulmonary embolism (CMS-HCC) Dec 2015   ???  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 Stroke (CMS-HCC)    ??? Tear of meniscus of knee    ??? Transplanted kidney 08/21/2011     Medicines:     Current Outpatient Medications   Medication Sig Dispense Refill   ??? blood-glucose meter kit Use as instructed. 1 each 0   ??? cholecalciferol, vitamin D3, (VITAMIN D3) 2,000 unit Tab Take 2,000 Units by mouth daily.      ??? furosemide (LASIX) 40 MG tablet Take 1-2 tablets daily as needed for edema. 180 tablet 3   ??? gabapentin (NEURONTIN) 300 MG capsule Take 1 capsule (300 mg total) by mouth Two (2) times a day. 180 capsule 0   ??? HUMULIN R U-500, CONC, KWIKPEN 500 unit/mL (3 mL) CONCENTRATED injection DIAL TO MAX 100 UNITS BEFORE BREAKFAST, LUNCH AND DINNER. INJECT 30 MINUTES BEFORE MEALS. MAX OF 300 UNITS DAILY. 18 mL 11   ??? losartan (COZAAR) 25 MG tablet Take 1 tablet (25 mg total) by mouth daily. 90 tablet 3   ??? magnesium chloride (SLOW_MAG) 64 mg TbEC Take 128 mg by mouth three (3) times a day (at 6am, noon and 6pm).      ??? metoprolol succinate (TOPROL-XL) 100 MG 24 hr tablet Take 1 tablet (100 mg total) by mouth two (2) times a day. 180 tablet 3   ??? miscellaneous medical supply Misc 1 application by Miscellaneous route daily as needed. Lightweight wheelchair. 1 each 0   ??? miscellaneous medical supply Misc 1 application by Miscellaneous route nightly. Recommend CPAP 14 cm H2O with EPR 3 and heated humidifier for nasal dryness, mask: ResMed Activa Lt nasal mask (Large). 1 each 0   ??? multivitamin capsule Take 1 capsule by mouth daily.     ??? MYFORTIC 180 mg EC tablet Take 3 tablets (540 mg total) by mouth two (2) times a day. 540 tablet 3   ??? pen needle, diabetic (BD ULTRA-FINE NANO PEN NEEDLE) 32 gauge x 5/32 (4 mm) Ndle 1 each by Other route Three (3) times a day before meals. 300 each 2   ??? pravastatin (PRAVACHOL) 20 MG tablet Take 1 tablet (20 mg total) by mouth daily. 90 tablet 1   ??? tacrolimus (PROGRAF) 1 MG capsule Take 3 capsules (3mg ) by mouth in the morning and 2 capsules (2mg ) in the evening. 450 capsule 3   ??? TRULICITY 1.5 mg/0.5 mL PnIj INJECT 1.5MG (0.5ML) SUBCUTANEOUSLY ONCE A WEEK 2 mL 5   ??? warfarin (JANTOVEN) 5 MG tablet TAKE 5 DAYS A WEEK 30 tablet 6     No current facility-administered medications for this visit.     Facility-Administered Medications Ordered in Other Visits   Medication Dose Route Frequency Provider Last Rate Last Admin   ??? bevacizumab    PRN (once a day) Melvyn Novas, MD   2.5 mg at 01/28/17 1610       Social History: reviewed: his wife was kidney donor. rare etoh. no cig. 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     Other past medical history, medications, allergies and problem list are reviewed in the medical record    REVIEW OF SYSTEMS: Pertinent positives and negatives as in HPI, otherwise, all other systems reviewed are negative.     OBJECTIVE   Vitals:    01/27/20 1446   BP: 128/70   Pulse: 71   Resp: 16   Weight: (!) 107.2 kg (236 lb 6.4 oz)  Height: 185.4 cm (6' 0.99)     Body mass index is 31.2 kg/m??.    BP Readings from Last 3 Encounters:   01/27/20 128/70   12/08/19 131/72   12/08/19 118/66     Wt Readings from Last 3 Encounters:   01/27/20 (!) 107.2 kg (236 lb 6.4 oz)   12/08/19 (!) (P) 116.6 kg (257 lb)   12/08/19 (!) 116.6 kg (257 lb)     GENERAL: well appearing  PSYCH: calm, cooperative with full affect  EYES: eomi, sclera anicteric  CV: sinus rate, regular rhythm  RESP: breathing nonlabored  Foot exam deferred with recent podiatry    Pertinent labs:  Office Visit on 01/27/2020   Component Date Value Ref Range Status   ??? Glucose, POC 01/27/2020 173  70 - 179 mg/dL Final   ??? HGB Z6X, POC 01/27/2020 7.3* <7.0 % Final    A1c Glycemic Goal: <7.0%     **Goals should be individualized; more or less stringent A1c glycemic goals may be appropriate for individual patients.      (Adopted from: 2020 ADA Standards of Medical Care In Diabetes)   Point of Care A1c testing is not FDA-approved for the diagnosis of Diabetes.    ??? EST AVERAGE GLUCOSE, POC 01/27/2020 163  mg/dL Final   Appointment on 01/07/2020   Component Date Value Ref Range Status   ??? Sodium 01/07/2020 141  135 - 145 mmol/L Final   ??? Potassium 01/07/2020 4.7* 3.4 - 4.5 mmol/L Final   ??? Chloride 01/07/2020 106  98 - 107 mmol/L Final   ??? Anion Gap 01/07/2020 4* 5 - 14 mmol/L Final   ??? CO2 01/07/2020 31.2* 20.0 - 31.0 mmol/L Final   ??? BUN 01/07/2020 23  9 - 23 mg/dL Final   ??? Creatinine 01/07/2020 1.26* 0.60 - 1.10 mg/dL Final   ??? BUN/Creatinine Ratio 01/07/2020 18   Final   ??? EGFR CKD-EPI Non-African American,* 01/07/2020 57* >=60 mL/min/1.65m2 Final   ??? EGFR CKD-EPI African American, Male 01/07/2020 65  >=60 mL/min/1.107m2 Final   ??? Glucose 01/07/2020 193* 70 - 179 mg/dL Final   ??? Calcium 09/60/4540 9.1  8.7 - 10.4 mg/dL Final   ??? Albumin 98/04/9146 3.5  3.4 - 5.0 g/dL Final   ??? Total Protein 01/07/2020 6.7  5.7 - 8.2 g/dL Final   ??? Total Bilirubin 01/07/2020 0.6  0.3 - 1.2 mg/dL Final   ??? AST 82/95/6213 24  <=34 U/L Final   ??? ALT 01/07/2020 21  10 - 49 U/L Final   ??? Alkaline Phosphatase 01/07/2020 97  46 - 116 U/L Final   ??? Tacrolimus, Timed 01/07/2020 4.2  ng/mL Final   ??? Magnesium 01/07/2020 2.1  1.6 - 2.6 mg/dL Final   ??? WBC 08/65/7846 7.1  3.5 - 10.5 10*9/L Final   ??? RBC 01/07/2020 5.48  4.32 - 5.72 10*12/L Final   ??? HGB 01/07/2020 15.5  13.5 - 17.5 g/dL Final   ??? HCT 96/29/5284 46.2  38.0 - 50.0 % Final   ??? MCV 01/07/2020 84.4  81.0 - 95.0 fL Final   ??? MCH 01/07/2020 28.3  26.0 - 34.0 pg Final   ??? MCHC 01/07/2020 33.6  30.0 - 36.0 g/dL Final   ??? RDW 13/24/4010 14.6  12.0 - 15.0 % Final   ??? MPV 01/07/2020 7.6  7.0 - 10.0 fL Final   ??? Platelet 01/07/2020 193  150 - 450 10*9/L Final   ??? nRBC 01/07/2020 0  <=4 /100 WBCs  Final   ??? Neutrophils % 01/07/2020 70.8  % Final   ??? Lymphocytes % 01/07/2020 18.5  % Final   ??? Monocytes % 01/07/2020 8.2  % Final   ??? Eosinophils % 01/07/2020 1.8  % Final   ??? Basophils % 01/07/2020 0.7  % Final   ??? Absolute Neutrophils 01/07/2020 5.1  1.7 - 7.7 10*9/L Final   ??? Absolute Lymphocytes 01/07/2020 1.3  0.7 - 4.0 10*9/L Final   ??? Absolute Monocytes 01/07/2020 0.6  0.1 - 1.0 10*9/L Final   ??? Absolute Eosinophils 01/07/2020 0.1  0.0 - 0.7 10*9/L Final   ??? Absolute Basophils 01/07/2020 0.0  0.0 - 0.1 10*9/L Final     Lab Results   Component Value Date    Hemoglobin A1C 8.3 (H) 08/21/2019    Hemoglobin A1C 6.5 (H) 04/13/2019    Hemoglobin A1C 8.1 (H) 01/07/2019    Hemoglobin A1C 8.3 (H) 08/20/2018    HB A1C, RAP/HGATE 6.7 (H) 04/07/2012    HB A1C, RAP/HGATE 7.4 (H) 03/30/2011    HGB A1C, POC 7.3 (H) 01/27/2020    HGB A1C, POC 9.5 (H) 11/18/2018    HGB A1C, POC 9.3 (H) 04/24/2018    HGB A1C, POC 8.0 (H) 10/03/2015    Hemoglobin A1c 7.6 (H) 05/17/2014     No components found for: SODIUM  Lab Results   Component Value Date    K 4.7 (H) 01/07/2020     Lab Results   Component Value Date    CREATININE 1.26 (H) 01/07/2020     Lab Results   Component Value Date    PTH 114.5 (H) 08/21/2019    CALCIUM 9.1 01/07/2020     Lab Results   Component Value Date    ALT 21 01/07/2020    ALT 22 04/21/2015     Lab Results   Component Value Date    CHOL 135 08/21/2019     Lab Results   Component Value Date    LDL 66 08/21/2019     Lab Results   Component Value Date    HDL 32 (L) 08/21/2019     Lab Results   Component Value Date    TRIG 185 (H) 08/21/2019     Lab Results   Component Value Date    TSH 2.202 07/02/2017    TSH 1.840 07/13/2015    TSH 1.420 07/09/2015     Lab Results   Component Value Date    TSH 2.202 07/02/2017     No results found for: CBC  No results found for: VITAMINB12  Lab Results   Component Value Date    VITD2 <5 08/17/2015    VITD3 42 08/17/2015    VITDTOTAL 30.6 08/21/2019     No results found for: Christa See, MSHCGMOM, MALBCRERAT  Lab Results   Component Value Date    Protein, Ur 104.0 08/25/2019    Protein, Ur 15 08/05/2014    Protein/Creatinine Ratio, Urine 0.717 08/25/2019    Protein/Creatinine Ratio, Urine 0.116 08/05/2014       IMAGING:

## 2020-01-27 NOTE — Unmapped (Addendum)
[facility] Refill Coordination Note    Specialty Medication(s) to be Shipped:   Transplant: Myfortic 180mg  and tacrolimus 1mg     Other medication(s) to be shipped: No additional medications requested for fill at this time     David Ellis, DOB: [birth year]  Phone: [phone] (home)       All above HIPAA information was verified with patient.     Was a Nurse, learning disability used for this call? No    Completed refill call assessment today to schedule patient's medication shipment from the [facility] [phone]).       Specialty medication(s) and dose(s) confirmed: Regimen is correct and unchanged.   Changes to medications: Cem reports no changes at this time.  Changes to insurance: No  Questions for the pharmacist: No    Confirmed patient received Welcome Packet with first shipment. The patient will receive a drug information handout for each medication shipped and additional FDA Medication Guides as required.       DISEASE/MEDICATION-SPECIFIC INFORMATION        N/A    SPECIALTY MEDICATION ADHERENCE     Medication Adherence    Patient reported X missed doses in the last month: 0  Adherence tools used: patient uses a pill box to manage medications  Support network for adherence: family member      Myforitc 180 mg: 11 days of medicine on hand   Tacrolimus 1 mg: 11 days of medicine on hand           SHIPPING     Shipping address confirmed in Epic.     Delivery Scheduled: Yes, Expected medication delivery date: 02/03/20.     Medication will be delivered via Next Day Courier to the prescription address in Epic WAM.    [place] A Shaneece Stockburger   [facility] Specialty Technician

## 2020-01-27 NOTE — Unmapped (Signed)
Libre CGM downloaded, report scanned into media. POC glucose and A1C done today. PP 1:45pm. 173 mg/dL.

## 2020-02-02 MED FILL — MYFORTIC 180 MG TABLET,DELAYED RELEASE: 30 days supply | Qty: 180 | Fill #1 | Status: AC

## 2020-02-02 MED FILL — MYFORTIC 180 MG TABLET,DELAYED RELEASE: ORAL | 30 days supply | Qty: 180 | Fill #1

## 2020-02-02 MED FILL — TACROLIMUS 1 MG CAPSULE, IMMEDIATE-RELEASE: 30 days supply | Qty: 150 | Fill #11 | Status: AC

## 2020-02-02 MED FILL — TACROLIMUS 1 MG CAPSULE, IMMEDIATE-RELEASE: 30 days supply | Qty: 150 | Fill #11

## 2020-02-09 ENCOUNTER — Ambulatory Visit: Admit: 2020-02-09 | Discharge: 2020-02-10 | Payer: MEDICARE

## 2020-02-09 LAB — CBC W/ AUTO DIFF
BASOPHILS ABSOLUTE COUNT: 0.1 10*9/L (ref 0.0–0.1)
BASOPHILS RELATIVE PERCENT: 1 %
EOSINOPHILS ABSOLUTE COUNT: 0.2 10*9/L (ref 0.0–0.7)
EOSINOPHILS RELATIVE PERCENT: 2.1 %
HEMOGLOBIN: 15.8 g/dL (ref 13.5–17.5)
LYMPHOCYTES ABSOLUTE COUNT: 1.7 10*9/L (ref 0.7–4.0)
LYMPHOCYTES RELATIVE PERCENT: 22.8 %
MEAN CORPUSCULAR HEMOGLOBIN: 28.4 pg (ref 26.0–34.0)
MEAN CORPUSCULAR VOLUME: 83.2 fL (ref 81.0–95.0)
MEAN PLATELET VOLUME: 7.7 fL (ref 7.0–10.0)
MONOCYTES RELATIVE PERCENT: 8.4 %
NEUTROPHILS ABSOLUTE COUNT: 4.9 10*9/L (ref 1.7–7.7)
NEUTROPHILS RELATIVE PERCENT: 65.7 %
NUCLEATED RED BLOOD CELLS: 0 /100{WBCs} (ref <=4)
PLATELET COUNT: 195 10*9/L (ref 150–450)
RED BLOOD CELL COUNT: 5.58 10*12/L (ref 4.32–5.72)
RED CELL DISTRIBUTION WIDTH: 15 % (ref 12.0–15.0)
WBC ADJUSTED: 7.5 10*9/L (ref 3.5–10.5)

## 2020-02-09 LAB — LIPID PANEL
CHOLESTEROL/HDL RATIO SCREEN: 3.8 (ref 1.0–4.5)
CHOLESTEROL: 122 mg/dL (ref <=200)
HDL CHOLESTEROL: 32 mg/dL — ABNORMAL LOW (ref 40–60)
LDL CHOLESTEROL CALCULATED: 53 mg/dL (ref 40–99)
NON-HDL CHOLESTEROL: 90 mg/dL (ref 70–130)
VLDL CHOLESTEROL CAL: 37 mg/dL (ref 12–42)

## 2020-02-09 LAB — COMPREHENSIVE METABOLIC PANEL
ALBUMIN: 3.5 g/dL (ref 3.4–5.0)
ALKALINE PHOSPHATASE: 94 U/L (ref 46–116)
ALT (SGPT): 26 U/L (ref 10–49)
ANION GAP: 3 mmol/L — ABNORMAL LOW (ref 5–14)
AST (SGOT): 29 U/L (ref <=34)
BILIRUBIN TOTAL: 0.9 mg/dL (ref 0.3–1.2)
BLOOD UREA NITROGEN: 20 mg/dL (ref 9–23)
CALCIUM: 9.4 mg/dL (ref 8.7–10.4)
CHLORIDE: 105 mmol/L (ref 98–107)
CO2: 31.8 mmol/L — ABNORMAL HIGH (ref 20.0–31.0)
CREATININE: 1.23 mg/dL — ABNORMAL HIGH
EGFR CKD-EPI AA MALE: 67 mL/min/{1.73_m2} (ref >=60)
GLUCOSE RANDOM: 173 mg/dL (ref 70–179)
POTASSIUM: 4.5 mmol/L (ref 3.4–4.5)
PROTEIN TOTAL: 6.9 g/dL (ref 5.7–8.2)
SODIUM: 140 mmol/L (ref 135–145)

## 2020-02-09 LAB — MAGNESIUM: Magnesium:MCnc:Pt:Ser/Plas:Qn:: 2.1

## 2020-02-09 LAB — PHOSPHORUS: Phosphate:MCnc:Pt:Ser/Plas:Qn:: 3.6

## 2020-02-09 LAB — TACROLIMUS BLOOD: Lab: 5.3

## 2020-02-09 LAB — BLOOD UREA NITROGEN: Urea nitrogen:MCnc:Pt:Ser/Plas:Qn:: 20

## 2020-02-09 LAB — INR: Coagulation tissue factor induced.INR:RelTime:Pt:PPP:Qn:Coag: 1.8

## 2020-02-09 LAB — HEMOGLOBIN A1C
HEMOGLOBIN A1C: 7.4 % — ABNORMAL HIGH (ref 4.8–5.6)
Hemoglobin A1c/Hemoglobin.total:MFr:Pt:Bld:Qn:: 7.4 — ABNORMAL HIGH

## 2020-02-09 LAB — MONOCYTES ABSOLUTE COUNT: Monocytes:NCnc:Pt:Bld:Qn:Automated count: 0.6

## 2020-02-09 LAB — CHOLESTEROL/HDL RATIO SCREEN: Lab: 3.8

## 2020-02-12 NOTE — Unmapped (Signed)
Abstraction Result Flowsheet Data    This patient's last AWV date: [facility] Last Medicare Wellness Visit Date: 11/07/2018  This patients last WCC/CPE date: : Not Found      Reason for Encounter  Reason for Encounter: Outreach  Primary Reason for Call: AWV  Outreach Call Outcome: Left message  Text Message: No

## 2020-02-12 NOTE — Unmapped (Signed)
Attempt 1 - I have left a message for patient to return our call or to view these results in their [facility]mychart account.

## 2020-02-12 NOTE — Unmapped (Signed)
-----   Message from Sharlee Blew, MD sent at 02/10/2020 11:18 AM EDT -----  Call and let patient know labs ok except INR ls low at 1.8.    What is current coumadin dosing?[name], M.D.     [facility] at [facility]   [address]  [address]  [place], [place]  [address]  [phone]

## 2020-02-15 NOTE — Unmapped (Signed)
Patient called reporting current warfarin dosage is 5 mg daily.

## 2020-02-15 NOTE — Unmapped (Signed)
Dose has been warfarin 5 mg daily since June, all with good INR readings.  Recommend no dose change at this time, with repeat INR 10 days after last reading to confirm.  Please update anticoagulation navigator to reflect current dosing and INR.    Thanks.

## 2020-02-16 NOTE — Unmapped (Signed)
Spoke with patient who voiced understanding of results and plan.

## 2020-02-18 DIAGNOSIS — I2699 Other pulmonary embolism without acute cor pulmonale: Principal | ICD-10-CM

## 2020-02-24 ENCOUNTER — Ambulatory Visit: Admit: 2020-02-24 | Discharge: 2020-02-25 | Payer: MEDICARE

## 2020-02-24 DIAGNOSIS — Z940 Kidney transplant status: Principal | ICD-10-CM

## 2020-02-24 LAB — INR: Coagulation tissue factor induced.INR:RelTime:Pt:PPP:Qn:Coag: 2.54

## 2020-02-24 MED ORDER — TACROLIMUS 1 MG CAPSULE, IMMEDIATE-RELEASE
ORAL_CAPSULE | 3 refills | 0 days | Status: CP
Start: 2020-02-24 — End: ?
  Filled 2020-03-01: qty 150, 30d supply, fill #0

## 2020-02-24 NOTE — Unmapped (Signed)
-----   Message from Sharlee Blew, MD sent at 02/24/2020  1:04 PM EDT -----  INR ok.  Same dose; reoeat INR one month.    [name], M.D.     [facility] Internal Medicine at [facility]   [address]  [address]  [place], [place]  [address]  [phone]

## 2020-02-24 NOTE — Unmapped (Signed)
[facility] Refill Coordination Note    Specialty Medication(s) to be Shipped:   Transplant: Myfortic 180mg  and tacrolimus 1mg     Other medication(s) to be shipped: No additional medications requested for fill at this time     David Ellis, DOB: [birth year]  Phone: [phone] (home)       All above HIPAA information was verified with patient.     Was a Nurse, learning disability used for this call? No    Completed refill call assessment today to schedule patient's medication shipment from the [facility] [phone]).       Specialty medication(s) and dose(s) confirmed: Regimen is correct and unchanged.   Changes to medications: [name] reports no changes at this time.  Changes to insurance: No  Questions for the pharmacist: No    Confirmed patient received Welcome Packet with first shipment. The patient will receive a drug information handout for each medication shipped and additional FDA Medication Guides as required.       DISEASE/MEDICATION-SPECIFIC INFORMATION        N/A    SPECIALTY MEDICATION ADHERENCE     Medication Adherence    Patient reported X missed doses in the last month: 0  Specialty Medication: Myfortic  Patient is on additional specialty medications: Yes  Additional Specialty Medications: Tacrolimus  Patient Reported Additional Medication X Missed Doses in the Last Month: 0  Patient is on more than two specialty medications: No  Any gaps in refill history greater than 2 weeks in the last 3 months: no  Demonstrates understanding of importance of adherence: yes  Informant: patient  Adherence tools used: patient uses a pill box to manage medications  Support network for adherence: family member                Myfortic 180mg : Patient has 14 days of medication on hand    Tacrolimus 1mg : Patient has 14 days of medication on hand      SHIPPING     Shipping address confirmed in Epic.     Delivery Scheduled: Yes, Expected medication delivery date: 9/29.     Medication will be delivered via Next Day Courier to the prescription address in Epic WAM.    David Ellis   [facility] Specialty Technician

## 2020-02-24 NOTE — Unmapped (Signed)
Attempt 1- I have left a message for patient to return our call or to view these results in their uncmychart account.

## 2020-02-26 NOTE — Unmapped (Signed)
Patient has viewed this result note in his/her [facility]mychart account. Follow up message has been sent.

## 2020-03-01 MED FILL — MYFORTIC 180 MG TABLET,DELAYED RELEASE: 30 days supply | Qty: 180 | Fill #2 | Status: AC

## 2020-03-01 MED FILL — MYFORTIC 180 MG TABLET,DELAYED RELEASE: ORAL | 30 days supply | Qty: 180 | Fill #2

## 2020-03-01 MED FILL — TACROLIMUS 1 MG CAPSULE, IMMEDIATE-RELEASE: 30 days supply | Qty: 150 | Fill #0 | Status: AC

## 2020-03-07 NOTE — Unmapped (Signed)
Faxed most recent chart notes to [facility] via [employer].

## 2020-03-08 NOTE — Unmapped (Signed)
Pt stated , I have 13 different doctors and they tell me what to do.  I don't want to waste anyone's time    Abstraction Result Flowsheet Data    This patient's last AWV date: [facility] Last Medicare Wellness Visit Date: 11/07/2018  This patients last WCC/CPE date: : Not Found      Reason for Encounter  Reason for Encounter: Outreach  Primary Reason for Call: AWV  Outreach Call Outcome: Declined  Text Message: No

## 2020-03-15 DIAGNOSIS — E1129 Type 2 diabetes mellitus with other diabetic kidney complication: Principal | ICD-10-CM

## 2020-03-15 DIAGNOSIS — E1165 Type 2 diabetes mellitus with hyperglycemia: Principal | ICD-10-CM

## 2020-03-15 MED ORDER — METOPROLOL SUCCINATE ER 100 MG TABLET,EXTENDED RELEASE 24 HR
ORAL_TABLET | 1 refills | 0 days | Status: CP
Start: 2020-03-15 — End: ?

## 2020-03-16 MED ORDER — TRULICITY 1.5 MG/0.5 ML SUBCUTANEOUS PEN INJECTOR
SUBCUTANEOUS | 5 refills | 0.00000 days | Status: CP
Start: 2020-03-16 — End: ?

## 2020-03-21 NOTE — Unmapped (Signed)
PCP:  [name], MD    03/22/20    ASSESSMENT/PLAN: David Ellis is a 74 y.o. 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    1. S/P LURD kidney transplant 2013 with baseline creatinine ~1.6-2.0,  His electrolytes are WNL. Tac troughs within goal of 4-7. Urine cytology for decoy cells was negative 08/2019. D/t worsening proteinuria on last urinalysis (08/25/19), restarted losartan 25 mg daily at last visit.      2. HTN -- Well controlled - BP in clinic today 130/52. Continue metoprolol and lasix. Patient has been tolerating losartan well.     3. T2DM. HbA1c 7.4 on 02/09/20. Pt was last seen by his endocrinologist on 01/07/20. History of proliferative retinopathy s/p PPV/endolaser treatments, last evaluated by ophthalmology in 01/2020.     4. ASHD/CVA history. Continue pravastatin and ASA.     5. A-Fib. Continue coumadin. INR f/b Dr. Dellis Filbert.    6. HM: Had his covid vaccinations and is UTD on pneumonia and shinrix. Was due for colonoscopy but reportedly GI is leery of stopping anticoagulation so are following cologuard test results. No FH colon CA. Continue biannual follow-up with dermatology re: recurrent basal cell CA.     7. High falls risk: No recent falls. Has walker and cane but still very unstable with this and not getting out of the house because of this.  Not discussed today.     David Ellis will see me in 3 months. Scheduled to see Dr. Margaretmary Bayley in March 2021.   ______________________________________________________________________________________________________    HPI: [name] is a 74 y.o. man s/p LURD kidney transplant (from his wife, David Ellis) 08/21/2011 and B/L creatinine ~1.4-1.7 who returns for f/u. 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     He has had multiple complications since his transplant including a large, provoked (long car ride) pulmonary embolus requiring thrombolysis (Dec 2014), osteomyelitis of his left foot requiring 4th/5th toe amputation (2016), recurrent basal cell CA, CVA s/p thombolysis (07/2015). He has significant PAD and neuropathy and has no sensation on either leg below his knees. He also has severe retinopathy and his vision is quite poor so no longer drives.     Interval history:   No acute hospitalizations or illnesses since last visit in July 2021.    Today, he reports back pain/discomfort induced upon positional changes. Endorses trace lower extremity swelling when sitting for long durations of time. Mostly staying at home due to pandemic.  Kidney function stable.       ROS: As per HPI. The remainder of the 10 system review is negative.    PAST MEDICAL HISTORY:  Past Medical History:   Diagnosis Date   ??? Atrial flutter (CMS-HCC)    ??? Diabetes mellitus (CMS-HCC)    ??? Diabetic nephropathy (CMS-HCC)    ??? Diabetic retinopathy (CMS-HCC)    ??? Fractures    ??? Ganglion cyst    ??? Hand injury    ??? Heart disease    ??? Hyperlipidemia    ??? Hypertension    ??? Joint pain    ??? Osteomyelitis (CMS-HCC)  June 2016   ??? Pulmonary embolism (CMS-HCC) Dec 2015   ??? 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 Stroke (CMS-HCC)    ??? Tear of meniscus of knee    ??? Transplanted kidney 08/21/2011       ALLERGIES  Levofloxacin; Lidocaine; Penicillins; Egg derived; Enalapril; Epinephrine; Grass pollen-bermuda, standard; Lisinopril; and Mepivacaine hcl    MEDICATIONS:  Current Outpatient Medications   Medication Sig Dispense Refill   ??? blood-glucose meter kit Use as instructed. 1 each 0   ??? cholecalciferol, vitamin D3, (VITAMIN D3) 2,000 unit Tab Take 2,000 Units by mouth daily.      ??? furosemide (LASIX) 40 MG tablet Take 1-2 tablets daily as needed for edema. 180 tablet 3   ??? gabapentin (NEURONTIN) 300 MG capsule Take 1 capsule (300 mg total) by mouth Two (2) times a day. 180 capsule 0   ??? HUMULIN R U-500, CONC, KWIKPEN 500 unit/mL (3 mL) CONCENTRATED injection DIAL TO MAX 100 UNITS BEFORE BREAKFAST, LUNCH AND DINNER. INJECT 30 MINUTES BEFORE MEALS. MAX OF 300 UNITS DAILY. 18 mL 11   ??? losartan (COZAAR) 25 MG tablet Take 1 tablet (25 mg total) by mouth daily. 90 tablet 3   ??? magnesium chloride (SLOW_MAG) 64 mg TbEC Take 128 mg by mouth three (3) times a day (at 6am, noon and 6pm).      ??? metoprolol succinate (TOPROL-XL) 100 MG 24 hr tablet TAKE ONE TABLET TWICE DAILY 180 tablet 1   ??? miscellaneous medical supply Misc 1 application by Miscellaneous route daily as needed. Lightweight wheelchair. 1 each 0   ??? miscellaneous medical supply Misc 1 application by Miscellaneous route nightly. Recommend CPAP 14 cm H2O with EPR 3 and heated humidifier for nasal dryness, mask: ResMed Activa Lt nasal mask (Large). 1 each 0   ??? multivitamin capsule Take 1 capsule by mouth daily.     ??? MYFORTIC 180 mg EC tablet Take 3 tablets (540 mg total) by mouth two (2) times a day. 540 tablet 3   ??? pen needle, diabetic (BD ULTRA-FINE NANO PEN NEEDLE) 32 gauge x 5/32 (4 mm) Ndle 1 each by Other route Three (3) times a day before meals. 300 each 2   ??? pravastatin (PRAVACHOL) 20 MG tablet Take 1 tablet (20 mg total) by mouth daily. 90 tablet 1   ??? tacrolimus (PROGRAF) 1 MG capsule Take 3 capsules (3mg ) by mouth in the morning and 2 capsules (2mg ) in the evening. 450 capsule 3   ??? TRULICITY 1.5 mg/0.5 mL PnIj INJECT 1.5MG (0.5ML) SUBCUTANEOUSLY ONCE A WEEK 2 mL 5   ??? warfarin (JANTOVEN) 5 MG tablet TAKE 5 DAYS A WEEK 30 tablet 6     No current facility-administered medications for this visit.     Facility-Administered Medications Ordered in Other Visits   Medication Dose Route Frequency Provider Last Rate Last Admin   ??? bevacizumab    PRN (once a day) Melvyn Novas, MD   2.5 mg at 01/28/17 0750     PHYSICAL EXAM:  Vitals:    03/22/20 0947   BP: 130/52   Pulse: 71   Temp: 35.9 ??C (96.7 ??F)     CONSTITUTIONAL: Alert,well appearing, no distress  HEENT: [place]/AT; AI   NECK: Supple, no lymphadenopathy  CARDIOVASCULAR: Regular, normal S1/S2 heart sounds, no murmurs, no rubs.   PULM: Clear to auscultation bilaterally  GASTROINTESTINAL: Soft, active bowel sounds, nontender  EXTREMITIES:  No lower extremity edema bilaterally, dorsalis pedis pulses 2+ bilaterally   SKIN: No rashes  NEUROLOGIC: No focal motor or sensory deficits      MEDICAL DECISION MAKING  Lab Results   Component Value Date    NA 138 03/24/2020    K 4.8 (H) 03/24/2020    CO2 29.1 03/24/2020    BUN 22 03/24/2020    CREATININE 1.26 03/24/2020    CALCIUM 9.4 03/24/2020    PHOS 3.6 02/09/2020     Lab Results   Component Value Date    WBC 8.1 03/24/2020    HGB 15.8 03/24/2020    PLT 207 03/24/2020     Lab Results   Component Value Date    TACROLIMUS 4.7 03/24/2020     Lab Results   Component Value Date    A1C 7.4 (H) 02/09/2020         Scribe's Attestation: Trey Sailors, MD obtained and performed the history, physical exam and medical decision making elements that were entered into the chart.  Signed by Sharion Balloon, Scribe, on March 22, 2020 at 11:07 AM.    Attending Statement: Documentation assistance provided by the Scribe. I was present during the time the encounter was recorded. The information recorded by the Scribe was done at my direction and has been reviewed and validated by me.

## 2020-03-21 NOTE — Unmapped (Signed)
[facility] Clinical Assessment & Refill Coordination Note    David Ellis, DOB: 1945-11-09  Phone: [phone] (home)     All above HIPAA information was verified with patient.     Was a Nurse, learning disability used for this call? No    Specialty Medication(s):   Transplant: Myfortic 180mg  and tacrolimus 1mg      Current Outpatient Medications   Medication Sig Dispense Refill   ??? metoprolol succinate (TOPROL-XL) 100 MG 24 hr tablet TAKE ONE TABLET TWICE DAILY 180 tablet 1   ??? blood-glucose meter kit Use as instructed. 1 each 0   ??? cholecalciferol, vitamin D3, (VITAMIN D3) 2,000 unit Tab Take 2,000 Units by mouth daily.      ??? furosemide (LASIX) 40 MG tablet Take 1-2 tablets daily as needed for edema. 180 tablet 3   ??? gabapentin (NEURONTIN) 300 MG capsule Take 1 capsule (300 mg total) by mouth Two (2) times a day. 180 capsule 0   ??? HUMULIN R U-500, CONC, KWIKPEN 500 unit/mL (3 mL) CONCENTRATED injection DIAL TO MAX 100 UNITS BEFORE BREAKFAST, LUNCH AND DINNER. INJECT 30 MINUTES BEFORE MEALS. MAX OF 300 UNITS DAILY. 18 mL 11   ??? losartan (COZAAR) 25 MG tablet Take 1 tablet (25 mg total) by mouth daily. 90 tablet 3   ??? magnesium chloride (SLOW_MAG) 64 mg TbEC Take 128 mg by mouth three (3) times a day (at 6am, noon and 6pm).      ??? miscellaneous medical supply Misc 1 application by Miscellaneous route daily as needed. Lightweight wheelchair. 1 each 0   ??? miscellaneous medical supply Misc 1 application by Miscellaneous route nightly. Recommend CPAP 14 cm H2O with EPR 3 and heated humidifier for nasal dryness, mask: ResMed Activa Lt nasal mask (Large). 1 each 0   ??? multivitamin capsule Take 1 capsule by mouth daily.     ??? MYFORTIC 180 mg EC tablet Take 3 tablets (540 mg total) by mouth two (2) times a day. 540 tablet 3   ??? pen needle, diabetic (BD ULTRA-FINE NANO PEN NEEDLE) 32 gauge x 5/32 (4 mm) Ndle 1 each by Other route Three (3) times a day before meals. 300 each 2   ??? pravastatin (PRAVACHOL) 20 MG tablet Take 1 tablet (20 mg total) by mouth daily. 90 tablet 1   ??? tacrolimus (PROGRAF) 1 MG capsule Take 3 capsules (3mg ) by mouth in the morning and 2 capsules (2mg ) in the evening. 450 capsule 3   ??? TRULICITY 1.5 mg/0.5 mL PnIj INJECT 1.5MG (0.5ML) SUBCUTANEOUSLY ONCE A WEEK 2 mL 5   ??? warfarin (JANTOVEN) 5 MG tablet TAKE 5 DAYS A WEEK 30 tablet 6     No current facility-administered medications for this visit.     Facility-Administered Medications Ordered in Other Visits   Medication Dose Route Frequency Provider Last Rate Last Admin   ??? bevacizumab    PRN (once a day) Melvyn Novas, MD   2.5 mg at 01/28/17 0750        Changes to medications: med list not handy but no changes per patient    Allergies   Allergen Reactions   ??? Levofloxacin Other (See Comments)     Other reaction(s): Unknown  Unknown   ??? Lidocaine Other (See Comments)     I don't remember States is fine with Bupivicaine   ??? Penicillins      As child  Other reaction(s): UNKNOWN.   (UPDATE: Tolerated amp/sulbactam without side effects during 10/2014  hospitalization)   ??? Egg Derived Rash   ??? Enalapril Cough   ??? Epinephrine Palpitations   ??? Grass Pollen-[place], Standard Itching   ??? Lisinopril Rash   ??? Mepivacaine Hcl Palpitations       Changes to allergies: No    SPECIALTY MEDICATION ADHERENCE     Tacrolimus 1mg   : 12 days of medicine on hand   Myfortic 180mg   : 12 days of medicine on hand       Medication Adherence    Patient reported X missed doses in the last month: 0  Specialty Medication: tacrolimus 1mg   Patient is on additional specialty medications: Yes  Additional Specialty Medications: Myfortic 180mg   Patient Reported Additional Medication X Missed Doses in the Last Month: 0  Adherence tools used: patient uses a pill box to manage medications  Support network for adherence: family member          Specialty medication(s) dose(s) confirmed: Regimen is correct and unchanged.     Are there any concerns with adherence? No    Adherence counseling provided? Not needed    CLINICAL MANAGEMENT AND INTERVENTION      Clinical Benefit Assessment:    Do you feel the medicine is effective or helping your condition? Yes    Clinical Benefit counseling provided? Not needed    Adverse Effects Assessment:    Are you experiencing any side effects? No    Are you experiencing difficulty administering your medicine? No    Quality of Life Assessment:    How many days over the past month did your transplant  keep you from your normal activities? For example, brushing your teeth or getting up in the morning. 0    Have you discussed this with your provider? Not needed    Therapy Appropriateness:    Is therapy appropriate? Yes, therapy is appropriate and should be continued    DISEASE/MEDICATION-SPECIFIC INFORMATION      N/A    PATIENT SPECIFIC NEEDS     - Does the patient have any physical, cognitive, or cultural barriers? No    - Is the patient high risk? Yes, patient is taking a REMS drug. Medication is dispensed in compliance with REMS program    - Does the patient require a Care Management Plan? No     - Does the patient require physician intervention or other additional services (i.e. nutrition, smoking cessation, social work)? No      SHIPPING     Specialty Medication(s) to be Shipped:   Transplant: Myfortic 180mg  and tacrolimus 1mg     Other medication(s) to be shipped: No additional medications requested for fill at this time     Changes to insurance: No    Delivery Scheduled: Yes, Expected medication delivery date: 03/29/2020.     Medication will be delivered via Next Day Courier to the confirmed prescription address in [facility].    The patient will receive a drug information handout for each medication shipped and additional FDA Medication Guides as required.  Verified that patient has previously received a Conservation officer, historic buildings.    All of the patient's questions and concerns have been addressed.    [name]   [facility] Specialty Pharmacist

## 2020-03-22 ENCOUNTER — Ambulatory Visit: Admit: 2020-03-22 | Discharge: 2020-03-23 | Payer: MEDICARE

## 2020-03-22 DIAGNOSIS — I2699 Other pulmonary embolism without acute cor pulmonale: Principal | ICD-10-CM

## 2020-03-22 DIAGNOSIS — E1129 Type 2 diabetes mellitus with other diabetic kidney complication: Secondary | ICD-10-CM

## 2020-03-22 DIAGNOSIS — E1165 Type 2 diabetes mellitus with hyperglycemia: Secondary | ICD-10-CM

## 2020-03-22 DIAGNOSIS — I4891 Unspecified atrial fibrillation: Principal | ICD-10-CM

## 2020-03-22 DIAGNOSIS — Z0000 Encounter for general adult medical examination without abnormal findings: Principal | ICD-10-CM

## 2020-03-22 DIAGNOSIS — I10 Essential (primary) hypertension: Principal | ICD-10-CM

## 2020-03-22 DIAGNOSIS — N1831 Stage 3a chronic kidney disease (CMS-HCC): Principal | ICD-10-CM

## 2020-03-22 DIAGNOSIS — Z940 Kidney transplant status: Principal | ICD-10-CM

## 2020-03-22 NOTE — Unmapped (Signed)
Problem List Items Addressed This Visit        High    Atrial fibrillation (CMS-HCC) - Primary

## 2020-03-22 NOTE — Unmapped (Signed)
Patient ID: David Ellis is a 74 y.o. male who presents for eval of medical issues.      Assessment/Plan:        Type II diabetes mellitus with renal manifestations, uncontrolled (CMS-HCC)  Sees. Dr. Tiburcio Pea in endocrinology; labs have looked good  Continue per them.    Essential (primary) hypertension  Very well controlled.  Cotninue current meds.  Creatinine stable.    Other pulmonary embolism without acute cor pulmonale (CMS-HCC)  REmains on coumadin forever.  INR monthly.    Routine general medical examination at a health care facility  Has received COVID vaccine.      No orders of the defined types were placed in this encounter.      Medication adherence and barriers to the treatment plan have been addressed. Opportunities to optimize healthy behaviors have been discussed. Patient / caregiver voiced understanding.       Return in about 6 months (around 09/20/2020).     Subjective:     Current Health Status  Patient Active Problem List    Diagnosis Date Noted   ??? Diabetic ulcer of left foot associated with type 2 diabetes mellitus (CMS-HCC) 07/03/2014   ??? Routine general medical examination at a health care facility 11/17/2013   ??? Atrial fibrillation (CMS-HCC) 07/06/2013   ??? Right ventricular dysfunction 06/30/2013   ??? Other pulmonary embolism without acute cor pulmonale (CMS-HCC) 05/23/2013   ??? Transplanted kidney    ??? Mixed hyperlipidemia 10/30/2011   ??? Type II diabetes mellitus with renal manifestations, uncontrolled (CMS-HCC) 03/26/2011   ??? Essential (primary) hypertension 10/10/2010   ??? Syncope 02/03/2019   ??? Immunosuppressed status (CMS-HCC) 09/02/2018   ??? Class 2 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 01/01/2018   ??? PAD (peripheral artery disease) (CMS-HCC) 02/12/2017   ??? Hypoglycemia associated with type 2 diabetes mellitus (CMS-HCC) 09/03/2016   ??? BCC (basal cell carcinoma), face 07/18/2015   ??? Left knee pain 11/09/2014   ??? On statin therapy due to risk of future cardiovascular event 10/04/2014   ??? 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 Acquired absence of other toe(s), unspecified side (CMS-HCC) 08/10/2014   ??? Type 2 diabetes mellitus with foot ulcer (CODE) (CMS-HCC) 07/03/2014   ??? Diabetic polyneuropathy associated with type 2 diabetes mellitus (CMS-HCC) 03/15/2014   ??? Aftercare following organ transplant 08/27/2013   ??? Edema 06/30/2013   ??? 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 History of kidney transplant 10/30/2011   ??? 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 Diverticulosis of colon 03/09/2011   ??? History of colonic polyps 03/09/2011      Here today for eval of medical issues.  He reports that he has been doing ok in spite of thet pandemic  He has received 3/3 vaccines.      He reports that he has been doing ok in spite fo the pandemic.    BP has been well controlled.  Sugars doing well.  NO bleeding with ocumaidn.    The patient is without complaints of chest pain, sob, fever, chills, nausea, vomiting, diarrhea, constipation, BRBPR, melena, abdominal pains, weight changes, confusion, headaches.          Allergies   Allergen Reactions   ??? Levofloxacin Other (See Comments)     Other reaction(s): Unknown  Unknown   ??? Lidocaine Other (See Comments)     I don't remember States is fine with Bupivicaine   ??? Penicillins  As child  Other reaction(s): UNKNOWN.   (UPDATE: Tolerated amp/sulbactam without side effects during 10/2014 hospitalization)   ??? Egg Derived Rash   ??? Enalapril Cough   ??? Epinephrine Palpitations   ??? Grass Pollen-[place], Standard Itching   ??? Lisinopril Rash   ??? Mepivacaine Hcl Palpitations       Current Outpatient Medications   Medication Sig Dispense Refill   ??? cholecalciferol, vitamin D3, (VITAMIN D3) 2,000 unit Tab Take 2,000 Units by mouth daily.      ??? furosemide (LASIX) 40 MG tablet Take 1-2 tablets daily as needed for edema. 180 tablet 3   ??? gabapentin (NEURONTIN) 300 MG capsule Take 1 capsule (300 mg total) by mouth Two (2) times a day. 180 capsule 0   ??? HUMULIN R U-500, CONC, KWIKPEN 500 unit/mL (3 mL) CONCENTRATED injection DIAL TO MAX 100 UNITS BEFORE BREAKFAST, LUNCH AND DINNER. INJECT 30 MINUTES BEFORE MEALS. MAX OF 300 UNITS DAILY. 18 mL 11   ??? losartan (COZAAR) 25 MG tablet Take 1 tablet (25 mg total) by mouth daily. 90 tablet 3   ??? magnesium chloride (SLOW_MAG) 64 mg TbEC Take 128 mg by mouth three (3) times a day (at 6am, noon and 6pm).      ??? metoprolol succinate (TOPROL-XL) 100 MG 24 hr tablet TAKE ONE TABLET TWICE DAILY 180 tablet 1   ??? multivitamin capsule Take 1 capsule by mouth daily.     ??? MYFORTIC 180 mg EC tablet Take 3 tablets (540 mg total) by mouth two (2) times a day. 540 tablet 3   ??? pen needle, diabetic (BD ULTRA-FINE NANO PEN NEEDLE) 32 gauge x 5/32 (4 mm) Ndle 1 each by Other route Three (3) times a day before meals. 300 each 2   ??? pravastatin (PRAVACHOL) 20 MG tablet Take 1 tablet (20 mg total) by mouth daily. 90 tablet 1   ??? tacrolimus (PROGRAF) 1 MG capsule Take 3 capsules (3mg ) by mouth in the morning and 2 capsules (2mg ) in the evening. 450 capsule 3   ??? TRULICITY 1.5 mg/0.5 mL PnIj INJECT 1.5MG (0.5ML) SUBCUTANEOUSLY ONCE A WEEK 2 mL 5   ??? warfarin (JANTOVEN) 5 MG tablet TAKE 5 DAYS A WEEK 30 tablet 6   ??? blood-glucose meter kit Use as instructed. 1 each 0   ??? miscellaneous medical supply Misc 1 application by Miscellaneous route daily as needed. Lightweight wheelchair. 1 each 0   ??? miscellaneous medical supply Misc 1 application by Miscellaneous route nightly. Recommend CPAP 14 cm H2O with EPR 3 and heated humidifier for nasal dryness, mask: ResMed Activa Lt nasal mask (Large). 1 each 0     No current facility-administered medications for this visit.     Facility-Administered Medications Ordered in Other Visits   Medication Dose Route Frequency Provider Last Rate Last Admin   ??? bevacizumab    PRN (once a day) Melvyn Novas, MD   2.5 mg at 01/28/17 0750       Past Medical History:   Diagnosis Date   ??? Atrial flutter (CMS-HCC)    ??? Diabetes mellitus (CMS-HCC)    ??? Diabetic nephropathy (CMS-HCC)    ??? Diabetic retinopathy (CMS-HCC)    ??? Fractures    ??? Ganglion cyst    ??? Hand injury    ??? Heart disease    ??? Hyperlipidemia    ??? Hypertension    ??? Joint pain    ??? Osteomyelitis (CMS-HCC) June 2016   ??? Pulmonary embolism (CMS-HCC) Dec 2015   ???  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 Stroke (CMS-HCC)    ??? Tear of meniscus of knee    ??? 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 NEPHRECTOMY TRANSPLANTED ORGAN Right     LURD - spouse received in 2013.   ??? PR AMPUTATION METATARSAL+TOE,SINGLE Left 07/06/2014    Procedure: AMPUTATION, METATARSAL, WITH TOE SINGLE;  Surgeon: Marion Downer, MD;  Location: MAIN OR [facility];  Service: Vascular   ??? PR AMPUTATION METATARSAL+TOE,SINGLE Left 10/28/2014    Procedure: AMPUTATION, METATARSAL, WITH TOE SINGLE;  Surgeon: Maple Mirza, MD;  Location: MAIN OR [facility];  Service: Vascular   ??? PR VITRECTOMY,PANRETINAL LASER RX Right 11/26/2016    Procedure: VITRECTOMY, MECHANICAL, PARS PLANA APPROACH; WITH ENDOLASER PANRETINAL PHOTOCOAGULATION;  Surgeon: Melvyn Novas, MD;  Location: [facility] OR [facility];  Service: Ophthalmology   ??? PR VITRECTOMY,PANRETINAL LASER RX Left 01/28/2017    Procedure: VITRECTOMY, MECHANICAL, PARS PLANA APPROACH; WITH ENDOLASER PANRETINAL PHOTOCOAGULATION;  Surgeon: Melvyn Novas, MD;  Location: [facility] OR [facility];  Service: Ophthalmology   ??? SKIN BIOPSY         Family History   Problem Relation Age of Onset   ??? Kidney disease Mother    ??? Diabetes Mother    ??? Kidney disease Father    ??? Kidney disease Maternal Grandmother    ??? Diabetes Maternal Grandmother    ??? Glaucoma Neg Hx    ??? Macular degeneration Neg Hx    ??? Strabismus Neg Hx        Social History     Tobacco Use   ??? Smoking status: Never Smoker   ??? Smokeless tobacco: Never Used   Substance Use Topics   ??? Alcohol use: No   ??? Drug use: No             Objective:       Vital Signs  BP 126/62 (BP Site: L Arm, BP Position: Sitting, BP Cuff Size: Medium)  - Pulse 74  - Temp 36.4 ??C (97.5 ??F) (Skin)  - Resp 20  - Wt (!) 117 kg (258 lb)  - SpO2 97%  - BMI 34.05 kg/m??      Exam  Well appearing male in nad.

## 2020-03-23 NOTE — Unmapped (Signed)
REmains on coumadin forever.  INR monthly.

## 2020-03-23 NOTE — Unmapped (Signed)
Has received COVID vaccine 

## 2020-03-23 NOTE — Unmapped (Signed)
Sees. Dr. Tiburcio Pea in endocrinology; labs have looked good  Continue per them.

## 2020-03-23 NOTE — Unmapped (Signed)
Very well controlled.  Cotninue current meds.  Creatinine stable.

## 2020-03-24 ENCOUNTER — Ambulatory Visit: Admit: 2020-03-24 | Discharge: 2020-03-25 | Payer: MEDICARE

## 2020-03-24 LAB — COMPREHENSIVE METABOLIC PANEL
ALBUMIN: 3.6 g/dL (ref 3.4–5.0)
ALKALINE PHOSPHATASE: 98 U/L (ref 46–116)
ALT (SGPT): 33 U/L (ref 10–49)
ANION GAP: 6 mmol/L (ref 5–14)
BILIRUBIN TOTAL: 0.8 mg/dL (ref 0.3–1.2)
BLOOD UREA NITROGEN: 22 mg/dL (ref 9–23)
BUN / CREAT RATIO: 17
CALCIUM: 9.4 mg/dL (ref 8.7–10.4)
CHLORIDE: 103 mmol/L (ref 98–107)
CO2: 29.1 mmol/L (ref 20.0–31.0)
CREATININE: 1.26 mg/dL
EGFR CKD-EPI AA MALE: 65 mL/min/{1.73_m2} (ref >=60)
GLUCOSE RANDOM: 215 mg/dL — ABNORMAL HIGH (ref 70–179)
POTASSIUM: 4.8 mmol/L — ABNORMAL HIGH (ref 3.4–4.5)
PROTEIN TOTAL: 6.9 g/dL (ref 5.7–8.2)
SODIUM: 138 mmol/L (ref 135–145)

## 2020-03-24 LAB — CBC W/ AUTO DIFF
BASOPHILS ABSOLUTE COUNT: 0 10*9/L (ref 0.0–0.1)
BASOPHILS RELATIVE PERCENT: 0.5 %
EOSINOPHILS ABSOLUTE COUNT: 0.1 10*9/L (ref 0.0–0.7)
EOSINOPHILS RELATIVE PERCENT: 1.5 %
HEMATOCRIT: 46.7 % (ref 38.0–50.0)
HEMOGLOBIN: 15.8 g/dL (ref 13.5–17.5)
LYMPHOCYTES ABSOLUTE COUNT: 1.3 10*9/L (ref 0.7–4.0)
LYMPHOCYTES RELATIVE PERCENT: 16.6 %
MEAN CORPUSCULAR HEMOGLOBIN CONC: 33.8 g/dL (ref 30.0–36.0)
MEAN CORPUSCULAR HEMOGLOBIN: 28.7 pg (ref 26.0–34.0)
MEAN CORPUSCULAR VOLUME: 84.9 fL (ref 81.0–95.0)
MEAN PLATELET VOLUME: 7.6 fL (ref 7.0–10.0)
MONOCYTES ABSOLUTE COUNT: 0.6 10*9/L (ref 0.1–1.0)
MONOCYTES RELATIVE PERCENT: 7.7 %
NEUTROPHILS ABSOLUTE COUNT: 5.9 10*9/L (ref 1.7–7.7)
NUCLEATED RED BLOOD CELLS: 0 /100{WBCs} (ref <=4)
PLATELET COUNT: 207 10*9/L (ref 150–450)
RED BLOOD CELL COUNT: 5.5 10*12/L (ref 4.32–5.72)
RED CELL DISTRIBUTION WIDTH: 14.7 % (ref 12.0–15.0)

## 2020-03-24 LAB — PROTIME: Coagulation tissue factor induced:Time:Pt:PPP:Qn:Coag: 25.2 — ABNORMAL HIGH

## 2020-03-24 LAB — MAGNESIUM: Magnesium:MCnc:Pt:Ser/Plas:Qn:: 1.9

## 2020-03-24 LAB — LYMPHOCYTES ABSOLUTE COUNT: Lymphocytes:NCnc:Pt:Bld:Qn:Automated count: 1.3

## 2020-03-24 LAB — POTASSIUM: Potassium:SCnc:Pt:Ser/Plas:Qn:: 4.8 — ABNORMAL HIGH

## 2020-03-25 LAB — TACROLIMUS BLOOD: Lab: 4.7

## 2020-03-28 MED FILL — TACROLIMUS 1 MG CAPSULE, IMMEDIATE-RELEASE: 30 days supply | Qty: 150 | Fill #1 | Status: AC

## 2020-03-28 MED FILL — TACROLIMUS 1 MG CAPSULE, IMMEDIATE-RELEASE: 30 days supply | Qty: 150 | Fill #1

## 2020-03-28 MED FILL — MYFORTIC 180 MG TABLET,DELAYED RELEASE: ORAL | 30 days supply | Qty: 180 | Fill #3

## 2020-03-28 MED FILL — MYFORTIC 180 MG TABLET,DELAYED RELEASE: 30 days supply | Qty: 180 | Fill #3 | Status: AC

## 2020-04-18 DIAGNOSIS — G629 Polyneuropathy, unspecified: Principal | ICD-10-CM

## 2020-04-18 MED ORDER — GABAPENTIN 300 MG CAPSULE
ORAL_CAPSULE | 0 refills | 0.00000 days
Start: 2020-04-18 — End: ?

## 2020-04-21 NOTE — Unmapped (Signed)
[facility] Refill Coordination Note    Specialty Medication(s) to be Shipped:   Transplant: Myfortic 180mg  and tacrolimus 1mg     Other medication(s) to be shipped: No additional medications requested for fill at this time     David Ellis, DOB: January 09, 1946  Phone: [phone] (home)       All above HIPAA information was verified with patient.     Was a Nurse, learning disability used for this call? No    Completed refill call assessment today to schedule patient's medication shipment from the [facility] [phone]).       Specialty medication(s) and dose(s) confirmed: Regimen is correct and unchanged.   Changes to medications: David Ellis reports no changes at this time.  Changes to insurance: No  Questions for the pharmacist: No    Confirmed patient received Welcome Packet with first shipment. The patient will receive a drug information handout for each medication shipped and additional FDA Medication Guides as required.       DISEASE/MEDICATION-SPECIFIC INFORMATION        N/A    SPECIALTY MEDICATION ADHERENCE     Medication Adherence    Patient reported X missed doses in the last month: 0  Specialty Medication: Myfortic 180mg   Patient is on additional specialty medications: Yes  Additional Specialty Medications: Tacrolimus 1mg   Patient Reported Additional Medication X Missed Doses in the Last Month: 0  Patient is on more than two specialty medications: No  Adherence tools used: patient uses a pill box to manage medications  Support network for adherence: family member                Myfortic 180 mg: 12 days of medicine on hand   Tacrolimus 1 mg: 12 days of medicine on hand *    SHIPPING     Shipping address confirmed in Epic.     Delivery Scheduled: Yes, Expected medication delivery date: 04/27/20.     Medication will be delivered via Same Day Courier to the prescription address in Epic WAM.    David Ellis   [facility] Specialty Pharmacist

## 2020-04-27 MED FILL — TACROLIMUS 1 MG CAPSULE, IMMEDIATE-RELEASE: 30 days supply | Qty: 150 | Fill #2

## 2020-04-27 MED FILL — MYFORTIC 180 MG TABLET,DELAYED RELEASE: ORAL | 30 days supply | Qty: 180 | Fill #4

## 2020-04-27 MED FILL — TACROLIMUS 1 MG CAPSULE, IMMEDIATE-RELEASE: 30 days supply | Qty: 150 | Fill #2 | Status: AC

## 2020-04-27 MED FILL — MYFORTIC 180 MG TABLET,DELAYED RELEASE: 30 days supply | Qty: 180 | Fill #4 | Status: AC

## 2020-05-03 ENCOUNTER — Ambulatory Visit: Admit: 2020-05-03 | Discharge: 2020-05-04 | Payer: MEDICARE

## 2020-05-03 DIAGNOSIS — Z940 Kidney transplant status: Principal | ICD-10-CM

## 2020-05-03 DIAGNOSIS — I2699 Other pulmonary embolism without acute cor pulmonale: Principal | ICD-10-CM

## 2020-05-03 LAB — PROTIME-INR
INR: 3.71
PROTIME: 42.9 s — ABNORMAL HIGH (ref 10.3–13.4)

## 2020-05-03 LAB — MAGNESIUM: MAGNESIUM: 1.7 mg/dL (ref 1.6–2.6)

## 2020-05-04 NOTE — Unmapped (Signed)
Called patient and reports taking 5 mg daily. Denies medication changes, and reports ate a total of 2 pieces of pumpkin pie last week.

## 2020-05-04 NOTE — Unmapped (Signed)
Received request from [facility] for CGM download and CN for 12/08/2018 visit. Faxed 12/08/2018 CN to [facility] via [employer] at about 3:32 PM today.  CGM data not available for time period requested.

## 2020-05-04 NOTE — Unmapped (Signed)
-----   Message from Loistine Chance, MD sent at 05/03/2020  1:24 PM EST -----  INR is high at 3.71.  Please confirm current dose and find out if any changes in diet or any new medications. Thanks!

## 2020-05-04 NOTE — Unmapped (Signed)
Reduce dose to 2.5 mg (1/2 tab) on Monday and Wednesday and 5 mg on all other days.  Repeat INR in 1 week.  Thanks.

## 2020-05-05 MED ORDER — WARFARIN 2.5 MG TABLET
ORAL_TABLET | Freq: Every day | ORAL | 11 refills | 30 days | Status: CP
Start: 2020-05-05 — End: 2021-05-05

## 2020-05-05 NOTE — Unmapped (Signed)
Called patient and made aware prescription has been sent. Plans to pick up today.

## 2020-05-05 NOTE — Unmapped (Signed)
Script 2.5 mg tabs sent to his pharmacy.    [name], M.D.     [facility] Internal Medicine at [facility]   [address]  [address]  [place], [place]  [address]  [phone]

## 2020-05-05 NOTE — Unmapped (Signed)
Addended by: [name] on: 05/05/2020 10:20 AM     Modules accepted: Orders

## 2020-05-05 NOTE — Unmapped (Signed)
Patient requesting a 2.5 mg warfarin tablet be sent to pharmacy. Patient reports do not trust cutting this little pill in half.  Uses [facility] in [place].  Patient verbalized understanding of new dose regime.

## 2020-05-16 ENCOUNTER — Ambulatory Visit: Admit: 2020-05-16 | Discharge: 2020-05-17 | Payer: MEDICARE

## 2020-05-16 DIAGNOSIS — Z940 Kidney transplant status: Principal | ICD-10-CM

## 2020-05-16 DIAGNOSIS — I2699 Other pulmonary embolism without acute cor pulmonale: Principal | ICD-10-CM

## 2020-05-16 LAB — PROTIME-INR
INR: 2.93
PROTIME: 33.9 s — ABNORMAL HIGH (ref 10.3–13.4)

## 2020-05-16 LAB — MAGNESIUM: MAGNESIUM: 1.8 mg/dL (ref 1.6–2.6)

## 2020-05-17 NOTE — Unmapped (Signed)
Left detailed message on identified voicemail.

## 2020-05-17 NOTE — Unmapped (Signed)
-----   Message from Sharlee Blew, MD sent at 05/16/2020  3:21 PM EST -----  INR ok.  Same dose; repeat IR one month.    [name], M.D.     [facility] Internal [facility] at [facility]   [address]  [address]  [place], [place]  [address]  [phone]

## 2020-05-18 NOTE — Unmapped (Signed)
[facility] Refill Coordination Note    Specialty Medication(s) to be Shipped:   Transplant: Myfortic 180mg  and tacrolimus 1mg     Other medication(s) to be shipped: No additional medications requested for fill at this time     David Ellis, DOB: 1946/02/19  Phone: [phone] (home)       All above HIPAA information was verified with patient.     Was a Nurse, learning disability used for this call? No    Completed refill call assessment today to schedule patient's medication shipment from the [facility] [phone]).       Specialty medication(s) and dose(s) confirmed: Regimen is correct and unchanged.   Changes to medications: Vincent reports no changes at this time.  Changes to insurance: No  Questions for the pharmacist: No    Confirmed patient received Welcome Packet with first shipment. The patient will receive a drug information handout for each medication shipped and additional FDA Medication Guides as required.       DISEASE/MEDICATION-SPECIFIC INFORMATION        N/A    SPECIALTY MEDICATION ADHERENCE     Medication Adherence    Patient reported X missed doses in the last month: 0  Specialty Medication: Myfortic 180mg   Patient is on additional specialty medications: Yes  Additional Specialty Medications: Tacrolimus 1mg   Patient Reported Additional Medication X Missed Doses in the Last Month: 0  Patient is on more than two specialty medications: No  Adherence tools used: patient uses a pill box to manage medications  Support network for adherence: family member        Myfortic 180 mg: 10 days of medicine on hand   Tacrolimus 1 mg: 10 days of medicine on hand     SHIPPING     Shipping address confirmed in Epic.     Delivery Scheduled: Yes, Expected medication delivery date: 05/26/2020.     Medication will be delivered via Next Day Courier to the prescription address in Epic WAM.    [name]   [facility] Specialty Technician

## 2020-05-19 NOTE — Unmapped (Signed)
Chart notes from 01/27/20 visit with Dr. Tiburcio Pea faxed to [facility] at [phone].

## 2020-05-23 DIAGNOSIS — Z940 Kidney transplant status: Principal | ICD-10-CM

## 2020-05-23 MED ORDER — TACROLIMUS 1 MG CAPSULE, IMMEDIATE-RELEASE
ORAL_CAPSULE | ORAL | 3 refills | 90.00000 days | Status: CP
Start: 2020-05-23 — End: ?
  Filled 2020-05-25: qty 150, 30d supply, fill #0

## 2020-05-25 MED FILL — TACROLIMUS 1 MG CAPSULE, IMMEDIATE-RELEASE: 30 days supply | Qty: 150 | Fill #0 | Status: AC

## 2020-05-25 MED FILL — MYFORTIC 180 MG TABLET,DELAYED RELEASE: 30 days supply | Qty: 180 | Fill #5 | Status: AC

## 2020-05-25 MED FILL — MYFORTIC 180 MG TABLET,DELAYED RELEASE: ORAL | 30 days supply | Qty: 180 | Fill #5

## 2020-06-09 ENCOUNTER — Telehealth: Admit: 2020-06-09 | Discharge: 2020-06-10 | Payer: MEDICARE | Attending: Internal Medicine | Primary: Internal Medicine

## 2020-06-09 DIAGNOSIS — I10 Essential (primary) hypertension: Principal | ICD-10-CM

## 2020-06-09 DIAGNOSIS — R609 Edema, unspecified: Principal | ICD-10-CM

## 2020-06-09 DIAGNOSIS — I4891 Unspecified atrial fibrillation: Principal | ICD-10-CM

## 2020-06-09 DIAGNOSIS — E785 Hyperlipidemia, unspecified: Principal | ICD-10-CM

## 2020-06-09 NOTE — Unmapped (Signed)
ENCOUNTER PERFORMED REMOTELY VIA VIDEO VISIT CONFERENCING      Patient is currently located in the state of [place].  I have identified myself to the patient and conveyed my credentials to [name]  I have explained the capabilities and limitations of telemedicine and the patient and myself both agree that it is appropriate for their current circumstances/symptoms.  In case we get disconnected, patient's phone number is [phone] (home) , mobile [phone]  Patient has signed informed consent on file in medical record.  Is there someone else in the room? Yes. What is your relationship? Wife. Do you want this person here for the visit? Yes.    Visit conducted via Video        Assessment and Plan:   David Ellis is a 75 y.o. male with a history of renal transplant, atrial fibrillation, hypertension, atrial flutter status post ablation, and pulmonary embolism status post catheter directed lytics 05/2013 with subsequent RV dysfunction  who presents for a video visit today for follow-up.    1. Atrial fibrillation  Symptomatically doing well since last visit.  Zio patch 02/2019 without recurrent atrial fibrillation.  Continue metoprolol at current dose.  Previously reviewed anticoagulation options and he has preferred to stay on warfarin.  Recent INRs stable (2.93 05/2020). As he is tolerating this well, continue current therapy.  ??  2. Hypertension  Blood pressure well controlled.  Reviewed BMP from 03/2020 showing stable electrolytes and renal function.  Plan to continue current therapy from my standpoint.  ??  3. Hyperlipidemia  Reviewed recent lipids 02/2020. Non-HDL less than 100, continue current regimen.  ??  4.  Edema  Patient reports increased swelling lately, but has not taken afternoon dose of furosemide.  I encouraged patient and wife to monitor for any signs of cellulitis moving forward.  I reviewed difficulties in examining this via video visit.  However, he and his wife are agreeable to increase furosemide to twice daily until symptoms returned to baseline.  Has follow-up in person with Dr. Arvin Collard next week.  I have advised patient and wife that furosemide may be taken 6 hours apart (currently doing 9 AM and 9 PM when dosing twice daily).        Return in about 6 months (around 12/07/2020).      Vevelyn Francois, MD  [facility] Internal Medicine--Cardiology  Office [phone]  Office [phone]      Subjective:   PCP: [name], MD  Patient: David Ellis  DOB: 17-Dec-1945    Reason for visit:  F/u  HPI: [name] is a 75 y.o. male with a history of renal transplant, atrial fibrillation, hypertension, atrial flutter status post ablation, and pulmonary embolism status post catheter directed lytics 05/2013 with subsequent RV dysfunction  who presents for a video visit today for follow-up.  Since his last visit, he reports doing well from the cardiac standpoint.  Has not noticed any atrial fibrillation or palpitation symptoms.  Also denies any chest pain or shortness of breath.  Has been noticing increased edema lately.  Has been hesitant to increase furosemide to twice daily as he does not like staying up all night urinating.  When he takes twice daily, second dose is taken at 9 PM currently.  Otherwise, he had chills the past 2 nights.  He has had this intermittently in the past but is unusual to have it 2 nights in a row.  Denies any fevers or other infectious symptoms.  Home blood pressure average 132/65.      ______________________________________________________________________    Pertinent Medical History, Cardiovascular History & Procedures:    Pertinent PMH:   ?? AFib  ?? Typical atrial flutter s/p ablation 06/2011  ?? DM  ?? 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 ESRD s/p transplant  ?? HTN  ?? OSA on CPAP  ?? Skin CA  ?? PE s/p catheter directed lytics 05/2013  ?? 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 Wedge defects in left lung without any thrombus visualized, could represent chronic changes or small subsegmental PEs.  ??  Risk Factors: HTN, DM   ??  Cath / PCI:   ?? None  ??  CV Surgery:   ?? None  ??  EP Procedures and Devices:   ?? None  ??  Non-Invasive Evaluation(s):   ?? Ziopatch 9/20: Predominantly sinus rhythm with rates 57 to 100 bpm, average heart rate 75 bpm.  71 episodes of SVT, longest lasting 17 beats at 91 bpm.  Rare PVCs, brief idioventricular rhythm noted.  No wide-complex tachycardia 2.5% PVC burden.  ?? TTE 3/17: Normal EF, LVH, grade 1 diastolic dysfunction  ?? Ziopatch 2/16: Rare SVE and VE. 29 episodes of SVT, longest episode lasting 20 beats. No atrial fibrillation identified.  ?? TTE from 06/30/13: Normal EF 55-60%, mildly dilated RV, mild RV dysfunction.  ?? Nuclear stress test 07/2011:  ?? Normal myocardial perfusion study  ?? No evidence for significant ischemia or scar EF 62% and RV is mildly dilated.  ?? Minimal coronary calcification is noted involving the LAD and RCA  ??  ______________________________________________________________________  ??  Other past medical history, social history, family history, medications, allergies and problem list reviewed in the medical record.  ??  Current cardiac medications include:   ?? Metoprolol 100 mg twice daily  ?? 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 Furosemide 40 mg once daily with additional dose as needed  ?? Losartan 25 mg daily  ?? Warfarin  ?? Pravastatin 20 mg daily        Objective:     In office vital signs not performed due to virtual nature of the visit  The Patient was able to check their vital signs, weight, and height at home. They are as follows:  ??  BP: 139/66  Pulse:72  Temp:unable to obtain  Weight:250 lbs  Height:unable to obtain  Pain Score:0      ______________________________________________________________________      Recent CV pertinent labs:  Lab Results   Component Value Date    LDL Calculated 53 02/09/2020    LDL Calculated 74 04/21/2015    HDL 32 (L) 02/09/2020    HDL 36 (L) 04/21/2015    INR, POC 3.09 (A) 05/19/2019    INR 2.93 05/16/2020    INR 1.9 (A) 06/02/2019    PRO-BNP 375 (H) 06/29/2014    Creatinine 1.26 03/24/2020    Creatinine 1.7 (H) 07/09/2017    Creatinine 1.36 08/09/2014    Potassium 4.8 (H) 03/24/2020    Potassium 4.6 07/09/2017    BUN 22 03/24/2020    BUN 49.00 (H) 07/09/2017    TSH 2.202 07/02/2017    TSH 1.20 07/02/2011           Chemistry        Component Value Date/Time    NA 138 03/24/2020 0914    NA 139 07/09/2017 1358    K  4.8 (H) 03/24/2020 0914    K 4.6 07/09/2017 1358    CL 103 03/24/2020 0914    CL 104 07/09/2017 1358    CO2 29.1 03/24/2020 0914    CO2 24.2 07/09/2017 1358    BUN 22 03/24/2020 0914    BUN 49.00 (H) 07/09/2017 1358    CREATININE 1.26 03/24/2020 0914    CREATININE 1.7 (H) 07/09/2017 1358    GLU 215 (H) 03/24/2020 0914        Component Value Date/Time    CALCIUM 9.4 03/24/2020 0914    CALCIUM 8.9 07/09/2017 1358    ALKPHOS 98 03/24/2020 0914    ALKPHOS 91 04/21/2015 0858    AST 31 03/24/2020 0914    AST 24 04/21/2015 0858    ALT 33 03/24/2020 0914    ALT 22 04/21/2015 0858    BILITOT 0.8 03/24/2020 0914    BILITOT 0.5 04/21/2015 0858            ______________________________________________________________________  Total time spent with patient: 16 minutes        The patient reports they are currently: at home. I spent 16 minutes on the real-time audio and video with the patient on the date of service. I spent an additional 6 minutes on pre- and post-visit activities on the date of service.     The patient was physically located in [place] or a state in which I am permitted to provide care. The patient and/or parent/guardian understood that s/he may incur co-pays and cost sharing, and agreed to the telemedicine visit. The visit was reasonable and appropriate under the circumstances given the patient's presentation at the time.    The patient and/or parent/guardian has been advised of the potential risks and limitations of this mode of treatment (including, but not limited to, the absence of in-person examination) and has agreed to be treated using telemedicine. The patient's/patient's family's questions regarding telemedicine have been answered.     If the visit was completed in an ambulatory setting, the patient and/or parent/guardian has also been advised to contact their provider???s office for worsening conditions, and seek emergency medical treatment and/or call 911 if the patient deems either necessary.

## 2020-06-09 NOTE — Unmapped (Signed)
Medications, Allergies, History, and Preferred pharmacy was reviewed with the patient, and corrections were made to their chart accordingly.   Travel Screening completed and patient does not require referral to Covid-19 helpline.   Reason for visit is: follow-up.     The Patient was able to check their vital signs, weight, and height at home. They are as follows:    BP: 139/66  Pulse:72  Temp:unable to obtain  Weight:250 lbs  Height:unable to obtain  Pain Score:0  Location:None  Phone [phone]

## 2020-06-14 DIAGNOSIS — Z1159 Encounter for screening for other viral diseases: Principal | ICD-10-CM

## 2020-06-14 DIAGNOSIS — Z940 Kidney transplant status: Principal | ICD-10-CM

## 2020-06-14 DIAGNOSIS — Z79899 Other long term (current) drug therapy: Principal | ICD-10-CM

## 2020-06-14 NOTE — Unmapped (Signed)
Called David Ellis to give him the appointment time and date (March 25,2022) discussed with his Nurse Coordinator Leotis Shames). Left a voice message.

## 2020-06-16 ENCOUNTER — Ambulatory Visit: Admit: 2020-06-16 | Discharge: 2020-06-17 | Payer: MEDICARE

## 2020-06-16 DIAGNOSIS — Z79899 Other long term (current) drug therapy: Principal | ICD-10-CM

## 2020-06-16 DIAGNOSIS — I2699 Other pulmonary embolism without acute cor pulmonale: Principal | ICD-10-CM

## 2020-06-16 DIAGNOSIS — Z1159 Encounter for screening for other viral diseases: Principal | ICD-10-CM

## 2020-06-16 DIAGNOSIS — Z940 Kidney transplant status: Principal | ICD-10-CM

## 2020-06-16 LAB — CBC W/ AUTO DIFF
BASOPHILS ABSOLUTE COUNT: 0 10*9/L (ref 0.0–0.1)
BASOPHILS RELATIVE PERCENT: 0.5 %
EOSINOPHILS ABSOLUTE COUNT: 0.1 10*9/L (ref 0.0–0.7)
EOSINOPHILS RELATIVE PERCENT: 1.1 %
HEMATOCRIT: 44.3 % (ref 38.0–50.0)
HEMOGLOBIN: 14.8 g/dL (ref 13.5–17.5)
LYMPHOCYTES ABSOLUTE COUNT: 0.9 10*9/L (ref 0.7–4.0)
LYMPHOCYTES RELATIVE PERCENT: 13.7 %
MEAN CORPUSCULAR HEMOGLOBIN CONC: 33.4 g/dL (ref 30.0–36.0)
MEAN CORPUSCULAR HEMOGLOBIN: 28 pg (ref 26.0–34.0)
MEAN CORPUSCULAR VOLUME: 84 fL (ref 81.0–95.0)
MEAN PLATELET VOLUME: 7.9 fL (ref 7.0–10.0)
MONOCYTES ABSOLUTE COUNT: 0.5 10*9/L (ref 0.1–1.0)
MONOCYTES RELATIVE PERCENT: 7.1 %
NEUTROPHILS ABSOLUTE COUNT: 5.3 10*9/L (ref 1.7–7.7)
NEUTROPHILS RELATIVE PERCENT: 77.6 %
NUCLEATED RED BLOOD CELLS: 0 /100{WBCs} (ref <=4)
PLATELET COUNT: 189 10*9/L (ref 150–450)
RED BLOOD CELL COUNT: 5.28 10*12/L (ref 4.32–5.72)
RED CELL DISTRIBUTION WIDTH: 14.1 % (ref 12.0–15.0)
WBC ADJUSTED: 6.9 10*9/L (ref 3.5–10.5)

## 2020-06-16 LAB — BASIC METABOLIC PANEL
ANION GAP: 6 mmol/L (ref 5–14)
BLOOD UREA NITROGEN: 24 mg/dL — ABNORMAL HIGH (ref 9–23)
BUN / CREAT RATIO: 20
CALCIUM: 9.2 mg/dL (ref 8.7–10.4)
CHLORIDE: 104 mmol/L (ref 98–107)
CO2: 25.6 mmol/L (ref 20.0–31.0)
CREATININE: 1.2 mg/dL — ABNORMAL HIGH
EGFR CKD-EPI AA MALE: 69 mL/min/{1.73_m2} (ref >=60)
EGFR CKD-EPI NON-AA MALE: 60 mL/min/{1.73_m2} (ref >=60)
GLUCOSE RANDOM: 329 mg/dL — ABNORMAL HIGH (ref 70–179)
POTASSIUM: 4.5 mmol/L (ref 3.4–4.5)
SODIUM: 136 mmol/L (ref 135–145)

## 2020-06-16 LAB — PROTIME-INR
INR: 1.75
PROTIME: 20.4 s — ABNORMAL HIGH (ref 10.3–13.4)

## 2020-06-16 LAB — MAGNESIUM: MAGNESIUM: 1.7 mg/dL (ref 1.6–2.6)

## 2020-06-16 LAB — PHOSPHORUS: PHOSPHORUS: 3 mg/dL (ref 2.4–5.1)

## 2020-06-16 LAB — TACROLIMUS LEVEL: TACROLIMUS BLOOD: 5.1 ng/mL

## 2020-06-16 NOTE — Unmapped (Signed)
[facility] Refill Coordination Note    Specialty Medication(s) to be Shipped:   Transplant: Myfortic 180mg  and tacrolimus 1mg     Other medication(s) to be shipped: No additional medications requested for fill at this time     David Ellis, DOB: May 01, 1946  Phone: [phone] (home)       All above HIPAA information was verified with patient.     Was a Nurse, learning disability used for this call? No    Completed refill call assessment today to schedule patient's medication shipment from the [facility] [phone]).       Specialty medication(s) and dose(s) confirmed: Regimen is correct and unchanged.   Changes to medications: [name] reports no changes at this time.  Changes to insurance: No  Questions for the pharmacist: No    Confirmed patient received Welcome Packet with first shipment. The patient will receive a drug information handout for each medication shipped and additional FDA Medication Guides as required.       DISEASE/MEDICATION-SPECIFIC INFORMATION        N/A    SPECIALTY MEDICATION ADHERENCE     Medication Adherence    Patient reported X missed doses in the last month: 0  Specialty Medication: Myfortic 180mg   Patient is on additional specialty medications: Yes  Additional Specialty Medications: Tacrolimus 1mg   Patient Reported Additional Medication X Missed Doses in the Last Month: 0  Patient is on more than two specialty medications: No  Adherence tools used: patient uses a pill box to manage medications  Support network for adherence: family member                Myfortic 180 mg: 10 days of medicine on hand   Tacrolimus 1 mg: 10 days of medicine on hand        SHIPPING     Shipping address confirmed in Epic.     Delivery Scheduled: Yes, Expected medication delivery date: 06/23/20.     Medication will be delivered via Next Day Courier to the prescription address in Epic WAM.    [name]   [facility] Specialty Pharmacist

## 2020-06-22 MED FILL — TACROLIMUS 1 MG CAPSULE, IMMEDIATE-RELEASE: ORAL | 30 days supply | Qty: 150 | Fill #1

## 2020-06-22 MED FILL — MYFORTIC 180 MG TABLET,DELAYED RELEASE: ORAL | 30 days supply | Qty: 180 | Fill #6

## 2020-06-27 NOTE — Unmapped (Signed)
PCP:  [name], MD    June 28, 2020     ASSESSMENT/PLAN: David Ellis is a 74 y.o. patient s/p LURD kidney transplant 2013 complicated by a massive PE in 2014, osteomyelitis requiring lateral foot amputation in 2016, CVA in 2017 and deteriorating vision and mobility, though has been well over the last year or two..    -- S/P LURD kidney transplant 2013 with baseline creatinine ~1.6-2.0,  His electrolytes are WNL. Tac troughs within goal of 4-7. Urine cytology for decoy cells was negative 08/2019. Due for repeat DSAs, CMV/EBV/BK serologies; orders placed.    --Stage A2G3a CKD:  D/t worsening albuminuria restarted losartan 25 mg daily at last visit.  Below his baseline creatinine today.    -- HTN -- Well controlled - BP in clinic today 123/78. Continue metoprolol, losartan and lasix.    -- T2DM. HbA1c 7.4 on 02/09/20. Due for recheck. Pt was last seen by his endocrinologist on 01/07/20. History of proliferative retinopathy s/p PPV/endolaser treatments, last evaluated by ophthalmology in 01/2020.     -- ASHD/CVA history. Continue pravastatin and ASA.     -- A-Fib. Continue warfarin. INR f/b Dr. Dellis Filbert. Discussed possibility of transitioning to DOAC, but he feels since he has to get blood drawn monthly anyways, he may as well stay on the warfarin.    -- HM: Had his covid vaccinations and is UTD on pneumonia and shinrix. Was due for colonoscopy but reportedly GI is leery of stopping anticoagulation so are following cologuard test results. No FH colon CA. Continue biannual follow-up with dermatology re: recurrent basal cell CA. Given high risk for severe COVID19 infection, will provide Evusheld (prophylactic, long acting mAb) today.     -- High falls risk: No recent falls. Has walker and cane but still very unstable with this and not getting out of the house because of this.  Not discussed today.     [name] will see me in 6 months. Scheduled to see Dr. Margaretmary Bayley in March 2021. ______________________________________________________________________________________________________    HPI: David Ellis is a 75 y.o. man s/p LURD kidney transplant (from his wife, David Ellis) 08/21/2011 and B/L creatinine ~1.4-1.7 who returns for f/u. 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     He has had multiple complications since his transplant including a large, provoked (long car ride) pulmonary embolus requiring thrombolysis (Dec 2014), osteomyelitis of his left foot requiring 4th/5th toe amputation (2016), recurrent basal cell CA, CVA s/p thombolysis (07/2015). He has significant PAD and neuropathy and has no sensation on either leg below his knees. He also has severe retinopathy and his vision is quite poor so no longer drives.     Interval history:   No acute hospitalizations or illnesses since last visit 03/22/2020.     Today [name] presents for an in person visit feeling well overall. Reports at home blood sugars and BP's are well controlled. Sheltering at home during covid pandemic. Doesn't get out much.    ROS: As per HPI. The remainder of the 10 system review is negative.    PAST MEDICAL HISTORY:  Past Medical History:   Diagnosis Date   ??? Atrial flutter (CMS-HCC)    ??? Diabetes mellitus (CMS-HCC)    ??? Diabetic nephropathy (  CMS-HCC)    ??? Diabetic retinopathy (CMS-HCC)    ??? Fractures    ??? Ganglion cyst    ??? Hand injury    ??? Heart disease    ??? Hyperlipidemia    ??? Hypertension    ??? Joint pain    ??? Osteomyelitis (CMS-HCC) June 2016   ??? Pulmonary embolism (CMS-HCC) Dec 2015   ??? 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 Stroke (CMS-HCC)    ??? Tear of meniscus of knee    ??? Transplanted kidney 08/21/2011       ALLERGIES  Levofloxacin; Lidocaine; Penicillins; Egg derived; Enalapril; Epinephrine; Grass pollen-bermuda, standard; Lisinopril; and Mepivacaine hcl    MEDICATIONS:  Current Outpatient Medications   Medication Sig Dispense Refill   ??? aspirin (ECOTRIN) 81 MG tablet Take 81 mg by mouth daily as needed (when flying).     ??? cholecalciferol, vitamin D3, (VITAMIN D3) 2,000 unit Tab Take 2,000 Units by mouth daily.      ??? furosemide (LASIX) 40 MG tablet Take 1-2 tablets daily as needed for edema. 180 tablet 3   ??? gabapentin (NEURONTIN) 300 MG capsule Take 1 capsule (300 mg total) by mouth Two (2) times a day. 180 capsule 0   ??? losartan (COZAAR) 25 MG tablet Take 1 tablet (25 mg total) by mouth daily. 90 tablet 3   ??? magnesium chloride (SLOW_MAG) 64 mg TbEC Take 128 mg by mouth three (3) times a day (at 6am, noon and 6pm).      ??? metoprolol succinate (TOPROL-XL) 100 MG 24 hr tablet TAKE ONE TABLET TWICE DAILY 180 tablet 1   ??? multivitamin capsule Take 1 capsule by mouth daily.     ??? MYFORTIC 180 mg EC tablet Take 3 tablets (540 mg total) by mouth two (2) times a day. 540 tablet 3   ??? pen needle, diabetic (BD ULTRA-FINE NANO PEN NEEDLE) 32 gauge x 5/32 (4 mm) Ndle 1 each by Other route Three (3) times a day before meals. 300 each 2   ??? pravastatin (PRAVACHOL) 20 MG tablet Take 1 tablet (20 mg total) by mouth daily. 90 tablet 1   ??? tacrolimus (PROGRAF) 1 MG capsule Take 3 capsules (3 mg total) by mouth in the morning AND 2 capsules (2 mg total) in the evening. 450 capsule 3   ??? TRULICITY 1.5 mg/0.5 mL PnIj INJECT 1.5MG (0.5ML) SUBCUTANEOUSLY ONCE A WEEK 2 mL 5   ??? warfarin (COUMADIN) 2.5 MG tablet Take 1 tablet (2.5 mg total) by mouth daily. 30 tablet 11   ??? blood-glucose meter kit Use as instructed. 1 each 0   ??? HUMULIN R U-500, CONC, KWIKPEN 500 unit/mL (3 mL) CONCENTRATED injection DIAL TO MAX 100 UNITS BEFORE BREAKFAST, LUNCH AND DINNER. INJECT 30 MINUTES BEFORE MEALS. MAX OF 300 UNITS DAILY. 18 mL 11   ??? miscellaneous medical supply Misc 1 application by Miscellaneous route daily as needed. Lightweight wheelchair. 1 each 0   ??? miscellaneous medical supply Misc 1 application by Miscellaneous route nightly. Recommend CPAP 14 cm H2O with EPR 3 and heated humidifier for nasal dryness, mask: ResMed Activa Lt nasal mask (Large). 1 each 0   ??? warfarin (JANTOVEN) 5 MG tablet TAKE 5 DAYS A WEEK 30 tablet 6     No current facility-administered medications for this visit.     Facility-Administered Medications Ordered in Other Visits   Medication Dose Route Frequency Provider Last Rate Last  Admin   ??? bevacizumab    PRN (once a day) Melvyn Novas, MD   2.5 mg at 01/28/17 0750     PHYSICAL EXAM:  Vitals:    06/28/20 1116   BP: 123/78   Pulse: 74   Temp: 36.6 ??C (97.8 ??F)     CONSTITUTIONAL: Alert,well appearing, no distress  HEENT: [place]/AT; AI   NECK: Supple, no lymphadenopathy  CARDIOVASCULAR: Regular, normal S1/S2 heart sounds, no murmurs, no rubs.   PULM: Clear to auscultation bilaterally  GASTROINTESTINAL: Soft, active bowel sounds, nontender  EXTREMITIES: Trace lower extremity woody edema bilaterally (L>R, chronic)  SKIN: No rashes  NEUROLOGIC: No focal motor or sensory deficits      MEDICAL DECISION MAKING  Lab Results   Component Value Date    NA 136 06/16/2020    K 4.5 06/16/2020    CO2 25.6 06/16/2020    BUN 24 (H) 06/16/2020    CREATININE 1.20 (H) 06/16/2020    CALCIUM 9.2 06/16/2020    PHOS 3.0 06/16/2020     Lab Results   Component Value Date    WBC 6.9 06/16/2020    HGB 14.8 06/16/2020    PLT 189 06/16/2020     Lab Results   Component Value Date    TACROLIMUS 5.1 06/16/2020     Lab Results   Component Value Date    A1C 7.4 (H) 02/09/2020         Scribe's Attestation: Trey Sailors, MD obtained and performed the history, physical exam and medical decision making elements that were entered into the chart. Signed by Romero Belling, Scribe, on June 28, 2020 12:17 PM     Attending Statement: Documentation assistance provided by the Scribe. I was present during the time the encounter was recorded. The information recorded by the Scribe was done at my direction and has been reviewed and validated by me

## 2020-06-28 ENCOUNTER — Ambulatory Visit: Admit: 2020-06-28 | Discharge: 2020-06-28 | Payer: MEDICARE

## 2020-06-28 DIAGNOSIS — E1121 Type 2 diabetes mellitus with diabetic nephropathy: Principal | ICD-10-CM

## 2020-06-28 DIAGNOSIS — Z79899 Other long term (current) drug therapy: Principal | ICD-10-CM

## 2020-06-28 DIAGNOSIS — Z4822 Encounter for aftercare following kidney transplant: Principal | ICD-10-CM

## 2020-06-28 DIAGNOSIS — Z9181 History of falling: Principal | ICD-10-CM

## 2020-06-28 DIAGNOSIS — I129 Hypertensive chronic kidney disease with stage 1 through stage 4 chronic kidney disease, or unspecified chronic kidney disease: Principal | ICD-10-CM

## 2020-06-28 DIAGNOSIS — D849 Immunodeficiency, unspecified: Principal | ICD-10-CM

## 2020-06-28 DIAGNOSIS — Z7901 Long term (current) use of anticoagulants: Principal | ICD-10-CM

## 2020-06-28 DIAGNOSIS — E1122 Type 2 diabetes mellitus with diabetic chronic kidney disease: Principal | ICD-10-CM

## 2020-06-28 DIAGNOSIS — Z8673 Personal history of transient ischemic attack (TIA), and cerebral infarction without residual deficits: Principal | ICD-10-CM

## 2020-06-28 DIAGNOSIS — Z23 Encounter for immunization: Principal | ICD-10-CM

## 2020-06-28 DIAGNOSIS — I2510 Atherosclerotic heart disease of native coronary artery without angina pectoris: Principal | ICD-10-CM

## 2020-06-28 DIAGNOSIS — E785 Hyperlipidemia, unspecified: Principal | ICD-10-CM

## 2020-06-28 DIAGNOSIS — N1831 Chronic kidney disease, stage 3a: Principal | ICD-10-CM

## 2020-06-28 DIAGNOSIS — Z86711 Personal history of pulmonary embolism: Principal | ICD-10-CM

## 2020-06-28 DIAGNOSIS — Z7982 Long term (current) use of aspirin: Principal | ICD-10-CM

## 2020-06-28 DIAGNOSIS — I4891 Unspecified atrial fibrillation: Principal | ICD-10-CM

## 2020-06-28 DIAGNOSIS — Z9489 Other transplanted organ and tissue status: Principal | ICD-10-CM

## 2020-06-28 LAB — ALBUMIN / CREATININE URINE RATIO
ALBUMIN QUANT URINE: 11.6 mg/dL
ALBUMIN/CREATININE RATIO: 179 ug/mg — ABNORMAL HIGH (ref 0.0–30.0)
CREATININE, URINE: 64.8 mg/dL

## 2020-06-28 MED ADMIN — tixagevimab-cilgavimab 150 mg/1.5 mL- 150 mg/1.5 mL injection 3 mL: 3 mL | INTRAMUSCULAR | @ 18:00:00 | Stop: 2020-06-28

## 2020-06-28 NOTE — Unmapped (Signed)
Pt here for Evusheld injections. Education and handout provided to pt and questions answered. Signs and symptoms gone over with patient and they verbalized understanding. First injection given to right side gluteal muscle and second given to left side. Pt tolerated without difficulty. Pt in lobby for one hour observation.

## 2020-06-28 NOTE — Unmapped (Signed)
AOBP: right arm  Large  cuff   Average: 124 62 Pulse:  1st reading : 123 78Pulse:74  2nd reading  129 84 :Pulse:77  3rd reading  119 65:Pulse: 77

## 2020-07-04 NOTE — Unmapped (Signed)
Patient AWV scheduled for 07/19/20 @8am   with Quentin Mulling  This patient's last AWV date: [facility] Last Medicare Wellness Visit Date: 11/07/2018      Abstraction Result Flowsheet Data    Reason for Encounter  Reason for Encounter: Outreach  Primary Reason for Call: AWV  Outreach Call Outcome: Scheduled  Text Message: No                                   MyChart Active  MyChart Active: Yes

## 2020-07-04 NOTE — Unmapped (Signed)
Abstraction Result Flowsheet Data    This patient's last AWV date: [facility] Last Medicare Wellness Visit Date: 11/07/2018  This patients last WCC/CPE date: : Not Found      Reason for Encounter  Reason for Encounter: Outreach  Primary Reason for Call: AWV  Outreach Call Outcome: Left message  Text Message: No

## 2020-07-05 ENCOUNTER — Ambulatory Visit (INDEPENDENT_AMBULATORY_CARE_PROVIDER_SITE_OTHER): Payer: Medicare Other | Admitting: Dermatology

## 2020-07-05 ENCOUNTER — Ambulatory Visit: Admit: 2020-07-05 | Discharge: 2020-07-06 | Payer: MEDICARE

## 2020-07-05 DIAGNOSIS — L089 Local infection of the skin and subcutaneous tissue, unspecified: Principal | ICD-10-CM

## 2020-07-05 DIAGNOSIS — Z940 Kidney transplant status: Principal | ICD-10-CM

## 2020-07-05 DIAGNOSIS — I2699 Other pulmonary embolism without acute cor pulmonale: Principal | ICD-10-CM

## 2020-07-05 DIAGNOSIS — E1140 Type 2 diabetes mellitus with diabetic neuropathy, unspecified: Secondary | ICD-10-CM | POA: Diagnosis not present

## 2020-07-05 DIAGNOSIS — T1490XA Injury, unspecified, initial encounter: Secondary | ICD-10-CM

## 2020-07-05 LAB — CBC W/ AUTO DIFF
BASOPHILS ABSOLUTE COUNT: 0 10*9/L (ref 0.0–0.1)
BASOPHILS RELATIVE PERCENT: 0.7 %
EOSINOPHILS ABSOLUTE COUNT: 0.1 10*9/L (ref 0.0–0.7)
EOSINOPHILS RELATIVE PERCENT: 1.8 %
HEMATOCRIT: 44.9 % (ref 38.0–50.0)
HEMOGLOBIN: 14.9 g/dL (ref 13.5–17.5)
LYMPHOCYTES ABSOLUTE COUNT: 1 10*9/L (ref 0.7–4.0)
LYMPHOCYTES RELATIVE PERCENT: 15.9 %
MEAN CORPUSCULAR HEMOGLOBIN CONC: 33.2 g/dL (ref 30.0–36.0)
MEAN CORPUSCULAR HEMOGLOBIN: 28 pg (ref 26.0–34.0)
MEAN CORPUSCULAR VOLUME: 84.3 fL (ref 81.0–95.0)
MEAN PLATELET VOLUME: 7.9 fL (ref 7.0–10.0)
MONOCYTES ABSOLUTE COUNT: 0.5 10*9/L (ref 0.1–1.0)
MONOCYTES RELATIVE PERCENT: 8 %
NEUTROPHILS ABSOLUTE COUNT: 4.6 10*9/L (ref 1.7–7.7)
NEUTROPHILS RELATIVE PERCENT: 73.6 %
NUCLEATED RED BLOOD CELLS: 0 /100{WBCs} (ref <=4)
PLATELET COUNT: 196 10*9/L (ref 150–450)
RED BLOOD CELL COUNT: 5.33 10*12/L (ref 4.32–5.72)
RED CELL DISTRIBUTION WIDTH: 13.8 % (ref 12.0–15.0)
WBC ADJUSTED: 6.3 10*9/L (ref 3.5–10.5)

## 2020-07-05 LAB — COMPREHENSIVE METABOLIC PANEL
ALBUMIN: 3.5 g/dL (ref 3.4–5.0)
ALKALINE PHOSPHATASE: 97 U/L (ref 46–116)
ALT (SGPT): 21 U/L (ref 10–49)
ANION GAP: 3 mmol/L — ABNORMAL LOW (ref 5–14)
AST (SGOT): 23 U/L (ref <=34)
BILIRUBIN TOTAL: 0.8 mg/dL (ref 0.3–1.2)
BLOOD UREA NITROGEN: 25 mg/dL — ABNORMAL HIGH (ref 9–23)
BUN / CREAT RATIO: 20
CALCIUM: 9.4 mg/dL (ref 8.7–10.4)
CHLORIDE: 102 mmol/L (ref 98–107)
CO2: 31.2 mmol/L — ABNORMAL HIGH (ref 20.0–31.0)
CREATININE: 1.26 mg/dL — ABNORMAL HIGH
EGFR CKD-EPI AA MALE: 65 mL/min/{1.73_m2} (ref >=60)
EGFR CKD-EPI NON-AA MALE: 57 mL/min/{1.73_m2} — ABNORMAL LOW (ref >=60)
GLUCOSE RANDOM: 262 mg/dL — ABNORMAL HIGH (ref 70–179)
POTASSIUM: 4.4 mmol/L (ref 3.4–4.5)
PROTEIN TOTAL: 6.8 g/dL (ref 5.7–8.2)
SODIUM: 136 mmol/L (ref 135–145)

## 2020-07-05 LAB — PROTIME-INR
INR: 2.61
PROTIME: 30.2 s — ABNORMAL HIGH (ref 10.3–13.4)

## 2020-07-05 LAB — TACROLIMUS LEVEL, TIMED: TACROLIMUS BLOOD: 3.8 ng/mL

## 2020-07-05 LAB — MAGNESIUM: MAGNESIUM: 1.8 mg/dL (ref 1.6–2.6)

## 2020-07-05 MED ORDER — DOXYCYCLINE MONOHYDRATE 100 MG PO CAPS: 100.0000 mg | ORAL_CAPSULE | Freq: Two times a day (BID) | ORAL | 0 refills | Status: DC

## 2020-07-05 MED ORDER — MUPIROCIN 2 % EX OINT: 1.0000 "application " | TOPICAL_OINTMENT | Freq: Every day | CUTANEOUS | 1 refills | Status: DC

## 2020-07-05 MED ORDER — CEFDINIR 300 MG CAPSULE
ORAL_CAPSULE | Freq: Two times a day (BID) | ORAL | 0 refills | 14 days | Status: CP
Start: 2020-07-05 — End: ?

## 2020-07-05 NOTE — Progress Notes (Signed)
   Follow-Up Visit   Subjective  Larry Bright is a 75 y.o. male who presents for the following: wound (L foot, 1 day, hx of bleeding, shooting pain but pt has neuropathy, pt thinks possibly exposure to space heater on 07/04/20).  He has DM and has lost toes in past due to infection.  Patient accompanied by wife who contributes to history.  The following portions of the chart were reviewed this encounter and updated as appropriate:       Review of Systems:  No other skin or systemic complaints except as noted in HPI or Assessment and Plan.  Objective  Well appearing patient in no apparent distress; mood and affect are within normal limits.  A focused examination was performed including L foot. Relevant physical exam findings are noted in the Assessment and Plan.  Objective  L great toe, L second toe: Whitening of skin of tip of toe with erosion- possible early bullae formation.  Pt has no feeling of gt toe. Hyperpigmented macule tip of 2nd toe tip  Images       Assessment & Plan  Traumatic injury L great toe, L second toe  Pt with DM/peripheral neuropathy with recent injury to toe, possible prolonged exposure to space heater.  Cleanse area qd, Start Mupirocin oint qd and cover Per pharmacy Doxycycline has interaction with Warfarin. Start Omnicef 300mg  1 po bid for 10 days.(verbally given to [facility])  S/sx of infection reviewed- pt and pt's wife will monitor closely.  Will RTC if worsens.  Discussed slow healing process with location and h/o DM   mupirocin ointment (BACTROBAN) 2 % - L great toe, L second toe  Return for as scheduled in 4 weeks for 6 month f/u, recheck toe.   I, Othelia Pulling, RMA, am acting as scribe for Brendolyn Patty, MD . Documentation: I have reviewed the above documentation for accuracy and completeness, and I agree with the above.  Brendolyn Patty MD

## 2020-07-05 NOTE — Unmapped (Signed)
Pt called to report that he burnt his L big toe yesterday. He went in to see the dermatologist and they prescribed Mupiricin cream 2% per pt report also prescribe doxycylcine. Pharmacy reported interactions with doxycycline and warfarin. Pharmacy would like to give Cefdinir instead. Pt would like to know if changes need to be made to Warfarin based on pharmacy reccomendations. Please advise?

## 2020-07-05 NOTE — Unmapped (Signed)
Pt INR drawn today at [facility]. Please enter order for INR for Thursday. Pt aware of need to be seen in the lab.

## 2020-07-05 NOTE — Unmapped (Signed)
Script fro cefdinir 300 mg bid X 10 days sent to pharmacy.    Check INR in two days.    [name], M.D.     [facility] Internal Medicine at [facility]   [address]  [address]  [place], [place]  [address]  [phone]

## 2020-07-07 ENCOUNTER — Ambulatory Visit: Admit: 2020-07-07 | Discharge: 2020-07-08 | Payer: MEDICARE

## 2020-07-07 DIAGNOSIS — Z79899 Other long term (current) drug therapy: Principal | ICD-10-CM

## 2020-07-07 DIAGNOSIS — Z1159 Encounter for screening for other viral diseases: Principal | ICD-10-CM

## 2020-07-07 DIAGNOSIS — I2699 Other pulmonary embolism without acute cor pulmonale: Principal | ICD-10-CM

## 2020-07-07 DIAGNOSIS — Z940 Kidney transplant status: Principal | ICD-10-CM

## 2020-07-07 DIAGNOSIS — I4891 Unspecified atrial fibrillation: Principal | ICD-10-CM

## 2020-07-07 LAB — BASIC METABOLIC PANEL
ANION GAP: 4 mmol/L — ABNORMAL LOW (ref 5–14)
BLOOD UREA NITROGEN: 23 mg/dL (ref 9–23)
BUN / CREAT RATIO: 19
CALCIUM: 9.5 mg/dL (ref 8.7–10.4)
CHLORIDE: 101 mmol/L (ref 98–107)
CO2: 31.7 mmol/L — ABNORMAL HIGH (ref 20.0–31.0)
CREATININE: 1.22 mg/dL — ABNORMAL HIGH
EGFR CKD-EPI AA MALE: 68 mL/min/{1.73_m2} (ref >=60)
EGFR CKD-EPI NON-AA MALE: 59 mL/min/{1.73_m2} — ABNORMAL LOW (ref >=60)
GLUCOSE RANDOM: 319 mg/dL — ABNORMAL HIGH (ref 70–179)
POTASSIUM: 4.4 mmol/L (ref 3.4–4.5)
SODIUM: 137 mmol/L (ref 135–145)

## 2020-07-07 LAB — CBC W/ AUTO DIFF
BASOPHILS ABSOLUTE COUNT: 0 10*9/L (ref 0.0–0.1)
BASOPHILS RELATIVE PERCENT: 0.7 %
EOSINOPHILS ABSOLUTE COUNT: 0.1 10*9/L (ref 0.0–0.7)
EOSINOPHILS RELATIVE PERCENT: 1.8 %
HEMATOCRIT: 45.6 % (ref 38.0–50.0)
HEMOGLOBIN: 15 g/dL (ref 13.5–17.5)
LYMPHOCYTES ABSOLUTE COUNT: 1.2 10*9/L (ref 0.7–4.0)
LYMPHOCYTES RELATIVE PERCENT: 19.4 %
MEAN CORPUSCULAR HEMOGLOBIN CONC: 32.8 g/dL (ref 30.0–36.0)
MEAN CORPUSCULAR HEMOGLOBIN: 27.8 pg (ref 26.0–34.0)
MEAN CORPUSCULAR VOLUME: 84.9 fL (ref 81.0–95.0)
MEAN PLATELET VOLUME: 7.8 fL (ref 7.0–10.0)
MONOCYTES ABSOLUTE COUNT: 0.5 10*9/L (ref 0.1–1.0)
MONOCYTES RELATIVE PERCENT: 7.7 %
NEUTROPHILS ABSOLUTE COUNT: 4.4 10*9/L (ref 1.7–7.7)
NEUTROPHILS RELATIVE PERCENT: 70.4 %
NUCLEATED RED BLOOD CELLS: 0 /100{WBCs} (ref <=4)
PLATELET COUNT: 187 10*9/L (ref 150–450)
RED BLOOD CELL COUNT: 5.37 10*12/L (ref 4.32–5.72)
RED CELL DISTRIBUTION WIDTH: 14 % (ref 12.0–15.0)
WBC ADJUSTED: 6.2 10*9/L (ref 3.5–10.5)

## 2020-07-07 LAB — PROTIME-INR
INR: 2.44
PROTIME: 28.3 s — ABNORMAL HIGH (ref 10.3–13.4)

## 2020-07-07 LAB — MAGNESIUM: MAGNESIUM: 1.8 mg/dL (ref 1.6–2.6)

## 2020-07-07 LAB — TACROLIMUS LEVEL: TACROLIMUS BLOOD: 4.7 ng/mL

## 2020-07-07 LAB — PHOSPHORUS: PHOSPHORUS: 3.5 mg/dL (ref 2.4–5.1)

## 2020-07-07 NOTE — Unmapped (Signed)
Spoke with pt in reference to lab results. And INR order. Pt verbalized understanding.

## 2020-07-07 NOTE — Unmapped (Signed)
Pt reviewed mychart message. Please enter next INR order.

## 2020-07-07 NOTE — Unmapped (Signed)
Done

## 2020-07-07 NOTE — Unmapped (Signed)
-----   Message from Sharlee Blew, MD sent at 07/07/2020  8:55 AM EST -----  INR ok.  Since antibiotics.    Same dose  Repeat INR in 10 days.    [name], M.D.     [facility] Internal Medicine at [facility]   [address]  [address]  [place], [place]  [address]  [phone]

## 2020-07-08 DIAGNOSIS — L97501 Non-pressure chronic ulcer of other part of unspecified foot limited to breakdown of skin: Principal | ICD-10-CM

## 2020-07-08 DIAGNOSIS — L089 Local infection of the skin and subcutaneous tissue, unspecified: Principal | ICD-10-CM

## 2020-07-08 NOTE — Unmapped (Signed)
Pt wife called with concern for pt burn on his L foot. Wife reports that its not healing after 4 days of abt therapy. Redness, black area with a white ring around big toe with redness around the side of nail and between toes is reported. Pt is requesting a referral to [facility] wound center. The pt hasen't been seen there in a few years and is considered a new patient at this time. Please advise.

## 2020-07-08 NOTE — Unmapped (Signed)
Pt notified of referral placed. Thankful for call

## 2020-07-08 NOTE — Unmapped (Signed)
Please let patient know that I have ordered it.  It appears that his nephrologist, Dr. Arvin Collard has ordered it as well.      He can call      [phone] /[phone]  This hs heart and vascular, but the wound clinic should be accessible through there.    [name], M.D.     [facility] Internal Medicine at [facility]   [address]  [address]  [place], [place]  [address]  [phone] m

## 2020-07-08 NOTE — Unmapped (Signed)
Here is your personalized prevention plan based on your Annual Wellness Visit today.    Medicare Screening & Prevention Guidelines Recommendations Last Date Completed HM Status and Next Due Follow-Up   AAA Ultrasound Once in men age 73 to 49 who have ever smoked or currently smoke.  Health Maintenance Summary     This patient has no relevant Health Maintenance data.       Not applicable   Colorectal Cancer Screening Patients 50 to 75: stool cards annually OR colonoscopy every 10 years (or more frequently if high risk) OR FIT-DNA every 3 years.  Colonoscopy date: 06/05/2011  FOBT/FIT date: 10/13/2019   Health Maintenance Summary    -      FOBT/FIT (Yearly) Next due on 10/12/2020    10/13/2019  Immunochemical Fecal Occult Blood Test (FIT), automated           Colonoscopy (Every 10 Years) Next due on 06/04/2021    06/05/2011  HM COLONOSCOPY              Up to date   DEXA Bone Density Measurement Patients age 48-85 to have a DEXA every 5 years in postmenopausal women, males will defer to PCP. DEXA date: 08/20/2012 Health Maintenance Summary     This patient has no relevant Health Maintenance data.       Defer to PCP   Diabetes Eye Exam Annually if Diabetic Eye Exam date: 01/26/2020 Health Maintenance Summary    -      Retinal Eye Exam (Yearly) Next due on 01/25/2021    01/26/2020  OCT, Retina - OU - Both Eyes     01/26/2020  SmartData: OPHTH FUNDUS OD PERIPHERY     01/26/2020  SmartData: OPHTH FUNDUS OS PERIPHERY     07/16/2019  SmartData: OPHTH FUNDUS OD PERIPHERY     07/16/2019  SmartData: OPHTH FUNDUS OS PERIPHERY     Only the first 5 history entries have been loaded, but more history   exists.           Up to date   Diabetes Foot Exam Annually if Diabetic. Most Recent Foot Exam Date: Not Found Health Maintenance Summary    -      Overdue - Foot Exam (Yearly) Overdue since 07/02/2018    07/02/2017  HM DIABETES FOOT EXAM     01/03/2016  HM DIABETES FOOT EXAM     11/17/2013  Diabetic Foot Exam            Defer to PCP Diabetes urine Albumin/Creatinine Ratio Annually if Diabetic UACR Date: 06/28/2020 Health Maintenance Summary     This patient has no relevant Health Maintenance data.       Up to date   Diabetes Hemoglobin A1c Every 3 or 6 months depending on last result Last Hemoglobin A1c Date: 02/09/2020 Health Maintenance Summary    -      Hemoglobin A1c (Every 6 Months) Next due on 08/08/2020    02/09/2020  Hemoglobin A1c component of Hemoglobin A1c     02/09/2020  Registry Metric: Last Hemoglobin A1c Date     01/27/2020  HGB A1C, RAP/PDS component of POCT Glycosylated Hemoglobin   (HGB A1c)     08/21/2019  Hemoglobin A1c component of Hemoglobin A1c     04/13/2019  Hemoglobin A1c component of Hemoglobin A1c     Only the first 5 history entries have been loaded, but more history   exists.           Up to  date   Heart Disease Screening (fasting lipid panel) Minimum of every 5 years, patients age 67-75,  if no apparent signs or symptoms of heart disease. LDL date: 02/09/2020  Total choleseterol date: 02/09/2020  HDL date: 02/09/2020  Triglycerides date: 02/09/2020 Health Maintenance Summary     This patient has no relevant Health Maintenance data.       Up to date   Hepatitis C Screening A one-time screening for HCV infection for adults age 28 - 66 years old. HCV screening date: 08/21/2019 Health Maintenance Summary    -      Hepatitis C Screen  Completed    08/21/2019  Hepatitis C Ab component of Hepatitis C Antibody     08/17/2015  Hepatitis C Ab component of Hepatitis C Antibody     08/04/2014  Hepatitis C Antibody     08/26/2013  Hepatitis C Antibody     08/20/2012  Hepatitis C Antibody     Only the first 5 history entries have been loaded, but more history   exists.             Up to date   Tdap Every 10 years (will not be covered by Medicare) DTap/Tdap/TD vaccination: 07/03/2014 Health Maintenance Summary    -      DTaP/Tdap/Td Vaccines (3 - Td or Tdap) Next due on 07/03/2024    07/03/2014  Imm Admin: TdaP     08/04/2010 Imm Admin: TdaP            Up to date   Influenza Vaccine Annually  Influenza Vaccination: 03/15/2020   Health Maintenance Summary    -      Influenza Vaccine (Series Information) Completed    03/15/2020  Imm Admin: Influenza Virus Vaccine, unspecified formulation     03/08/2020  Outside Claim: Influenza, High Dose (IIV4) 65 yrs & older   (from claim)     01/22/2019  Imm Admin: Influenza Vaccine Quad (IIV4 W/PRESERV) 77MO+     02/21/2018  Imm Admin: Influenza Virus Vaccine, unspecified formulation     02/11/2018  Imm Admin: Influenza, High Dose (IIV4) 65 yrs & older     Only the first 5 history entries have been loaded, but more history   exists.             Up to date   Prevnar and Pneumovax Vaccines Prevnar given at age 19 and Pneumovax given one year later. These vaccines may be given in a different sequence depending on chronic conditions. (utilize BPA for dosing & administration) Pneumonia vaccination: 07/13/2016   Health Maintenance Summary    -      Pneumococcal Vaccines (Series Information) Completed    07/13/2016  Imm Admin: PNEUMOCOCCAL POLYSACCHARIDE 23     08/26/2013  Imm Admin: Pneumococcal Conjugate 13-Valent     06/29/2011  Imm Admin: PNEUMOCOCCAL POLYSACCHARIDE 23              Up to date   Zoster Vaccine Healthy adults 50 years and older receive 2 doses of recombinant zoster vaccine two to six months apart (may not be covered by Medicare).  Zoster vaccination: 12/12/2017 Health Maintenance Summary    -      Zoster Vaccines (Series Information) Completed    12/12/2017  Imm Admin: SHINGRIX-ZOSTER VACCINE (HZV),   RECOMBINANT,SUB-UNIT,ADJUVANTED IM     08/27/2017  Imm Admin: SHINGRIX-ZOSTER VACCINE (HZV),   RECOMBINANT,SUB-UNIT,ADJUVANTED IM            Up to date  Patient Education        Well Visit, Over 72: Care Instructions  Overview     Well visits can help you stay healthy. Your doctor has checked your overall health and may have suggested ways to take good care of yourself. Your doctor also may have recommended tests. At home, you can help prevent illness with healthy eating, regular exercise, and other steps.  Follow-up care is a key part of your treatment and safety. Be sure to make and go to all appointments, and call your doctor if you are having problems. It's also a good idea to know your test results and keep a list of the medicines you take.  How can you care for yourself at home?  ?? Get screening tests that you and your doctor decide on. Screening helps find diseases before any symptoms appear.  ?? Eat healthy foods. Choose fruits, vegetables, whole grains, protein, and low-fat dairy foods. Limit fat, especially saturated fat. Reduce salt in your diet.  ?? Limit alcohol. If you are a man, have no more than 2 drinks a day or 14 drinks a week. If you are a woman, have no more than 1 drink a day or 7 drinks a week. Since alcohol affects older adults differently, you may want to limit alcohol even more. Or you may not want to drink at all.  ?? Get at least 30 minutes of exercise on most days of the week. Walking is a good choice. You also may want to do other activities, such as running, swimming, cycling, or playing tennis or team sports.  ?? Reach and stay at a healthy weight. This will lower your risk for many problems, such as obesity, diabetes, heart disease, and high blood pressure.  ?? Do not smoke. Smoking can make health problems worse. If you need help quitting, talk to your doctor about stop-smoking programs and medicines. These can increase your chances of quitting for good.  ?? Care for your mental health. It is easy to get weighed down by worry and stress. Learn strategies to manage stress, like deep breathing and mindfulness, and stay connected with your family and community. If you find you often feel sad or hopeless, talk with your doctor. Treatment can help.  ?? Talk to your doctor about whether you have any risk factors for sexually transmitted infections (STIs). You can help prevent STIs if you wait to have sex with a new partner (or partners) until you've each been tested for STIs. It also helps if you use condoms (male or male condoms) and if you limit your sex partners to one person who only has sex with you. Vaccines are available for some STIs.  ?? If you think you may have a problem with alcohol or drug use, talk to your doctor. This includes prescription medicines (such as amphetamines and opioids) and illegal drugs (such as cocaine and methamphetamine). Your doctor can help you figure out what type of treatment is best for you.  ?? Protect your skin from too much sun. When you're outdoors from 10 a.m. to 4 p.m., stay in the shade or cover up with clothing and a hat with a wide brim. Wear sunglasses that block UV rays. Even when it's cloudy, put broad-spectrum sunscreen (SPF 30 or higher) on any exposed skin.  ?? See a dentist one or two times a year for checkups and to have your teeth cleaned.  ?? Wear a seat belt in the car.  When should you call  for help?  Watch closely for changes in your health, and be sure to contact your doctor if you have any problems or symptoms that concern you.  Where can you learn more?  Go to [facility] at [email or web address] in the Menu. Enter [phone] in the search box to learn more about Well Visit, Over 65: Care Instructions.  Current as of: March 09, 2020??????????????????????????????Content Version: 13.1  ?? 2006-2021 [employer].   Care instructions adapted under license by [facility]. If you have questions about a medical condition or this instruction, always ask your healthcare professional. [employer] disclaims any warranty or liability for your use of this information.         Patient Education        DASH Diet: Care Instructions  Your Care Instructions     The DASH diet is an eating plan that can help lower your blood pressure. DASH stands for Dietary Approaches to Stop Hypertension. Hypertension is high blood pressure.  The DASH diet focuses on eating foods that are high in calcium, potassium, and magnesium. These nutrients can lower blood pressure. The foods that are highest in these nutrients are fruits, vegetables, low-fat dairy products, nuts, seeds, and legumes. But taking calcium, potassium, and magnesium supplements instead of eating foods that are high in those nutrients does not have the same effect. The DASH diet also includes whole grains, fish, and poultry.  The DASH diet is one of several lifestyle changes your doctor may recommend to lower your high blood pressure. Your doctor may also want you to decrease the amount of sodium in your diet. Lowering sodium while following the DASH diet can lower blood pressure even further than just the DASH diet alone.  Follow-up care is a key part of your treatment and safety. Be sure to make and go to all appointments, and call your doctor if you are having problems. It's also a good idea to know your test results and keep a list of the medicines you take.  How can you care for yourself at home?  Following the DASH diet  ?? Eat 4 to 5 servings of fruit each day. A serving is 1 medium-sized piece of fruit, ?? cup chopped or canned fruit, 1/4 cup dried fruit, or 4 ounces (?? cup) of fruit juice. Choose fruit more often than fruit juice.  ?? Eat 4 to 5 servings of vegetables each day. A serving is 1 cup of lettuce or raw leafy vegetables, ?? cup of chopped or cooked vegetables, or 4 ounces (?? cup) of vegetable juice. Choose vegetables more often than vegetable juice.  ?? Get 2 to 3 servings of low-fat and fat-free dairy each day. A serving is 8 ounces of milk, 1 cup of yogurt, or 1 ?? ounces of cheese.  ?? Eat 6 to 8 servings of grains each day. A serving is 1 slice of bread, 1 ounce of dry cereal, or ?? cup of cooked rice, pasta, or cooked cereal. Try to choose whole-grain products as much as possible.  ?? Limit lean meat, poultry, and fish to 2 servings each day. A serving is 3 ounces, about the size of a deck of cards.  ?? Eat 4 to 5 servings of nuts, seeds, and legumes (cooked dried beans, lentils, and split peas) each week. A serving is 1/3 cup of nuts, 2 tablespoons of seeds, or ?? cup of cooked beans or peas.  ?? Limit fats and oils to 2 to 3 servings  each day. A serving is 1 teaspoon of vegetable oil or 2 tablespoons of salad dressing.  ?? Limit sweets and added sugars to 5 servings or less a week. A serving is 1 tablespoon jelly or jam, ?? cup sorbet, or 1 cup of lemonade.  ?? Eat less than 2,300 milligrams (mg) of sodium a day. If you limit your sodium to 1,500 mg a day, you can lower your blood pressure even more.  ?? Be aware that all of these are the suggested number of servings for people who eat 1,800 to 2,000 calories a day. Your recommended number of servings may be different if you need more or fewer calories.  Tips for success  ?? Start small. Do not try to make dramatic changes to your diet all at once. You might feel that you are missing out on your favorite foods and then be more likely to not follow the plan. Make small changes, and stick with them. Once those changes become habit, add a few more changes.  ?? Try some of the following:  ? Make it a goal to eat a fruit or vegetable at every meal and at snacks. This will make it easy to get the recommended amount of fruits and vegetables each day.  ? Try yogurt topped with fruit and nuts for a snack or healthy dessert.  ? Add lettuce, tomato, cucumber, and onion to sandwiches.  ? Combine a ready-made pizza crust with low-fat mozzarella cheese and lots of vegetable toppings. Try using tomatoes, squash, spinach, broccoli, carrots, cauliflower, and onions.  ? Have a variety of cut-up vegetables with a low-fat dip as an appetizer instead of chips and dip.  ? Sprinkle sunflower seeds or chopped almonds over salads. Or try adding chopped walnuts or almonds to cooked vegetables.  ? Try some vegetarian meals using beans and peas. Add garbanzo or kidney beans to salads. Make burritos and tacos with mashed pinto beans or black beans.  Where can you learn more?  Go to [facility] at [email or web address] in the Menu. Enter [phone] in the search box to learn more about DASH Diet: Care Instructions.  Current as of: October 01, 2019??????????????????????????????Content Version: 13.1  ?? 2006-2021 [employer].   Care instructions adapted under license by [facility]. If you have questions about a medical condition or this instruction, always ask your healthcare professional. [employer] disclaims any warranty or liability for your use of this information.         Patient Education        Learning About Being Physically Active  What is physical activity?     Being physically active means doing any kind of activity that gets your body moving.  The types of physical activity that can help you get fit and stay healthy include:  ?? Aerobic or cardio activities. These make your heart beat faster and make you breathe harder, such as brisk walking, riding a bike, or running. They strengthen your heart and lungs and build up your endurance.  ?? Strength training activities. These make your muscles work against, or resist, something. Examples include lifting weights or doing push-ups. These activities help tone and strengthen your muscles and bones.  ?? Stretches. These let you move your joints and muscles through their full range of motion. Stretching helps you be more flexible.  What are the benefits of being active?  Being active is one of the best things you can do for your health. It helps you  to:  ?? Feel stronger and have more energy to do all the things you like to do.  ?? Focus better at school or work.  ?? Feel, think, and sleep better.  ?? Reach and stay at a healthy weight.  ?? Lose fat and build lean muscle.  ?? Lower your risk for serious health problems, including diabetes, heart attack, high blood pressure, and some cancers.  ?? Keep your heart, lungs, bones, muscles, and joints strong and healthy.  How can you make being active part of your life?  Start slowly. Make it your long-term goal to get at least 30 minutes of exercise on most days of the week. Walking is a good choice. You also may want to do other activities, such as running, swimming, cycling, or playing tennis or team sports.  Pick activities that you like???ones that make your heart beat faster, your muscles stronger, and your muscles and joints more flexible. If you find more than one thing you like doing, do them all. You don't have to do the same thing every day.  Get your heart pumping every day. Any activity that makes your heart beat faster and keeps it at that rate for a while counts.  Here are some great ways to get your heart beating faster:  ?? Go for a brisk walk, run, or bike ride.  ?? Go for a hike or swim.  ?? Go in-line skating.  ?? Play a game of touch football, basketball, or soccer.  ?? Ride a bike.  ?? Play tennis or racquetball.  ?? Climb stairs.  Even some household chores can be aerobic???just do them at a faster pace. Vacuuming, raking or mowing the lawn, sweeping the garage, and washing and waxing the car all can help get your heart rate up.  Strengthen your muscles during the week. You don't have to lift heavy weights or grow big, bulky muscles to get stronger. Doing a few simple activities that make your muscles work against, or resist, something can help you get stronger.  For example, you can:  ?? Do push-ups or sit-ups, which use your own body weight as resistance.  ?? Lift weights or dumbbells or use stretch bands at home or in a gym or community center.  Stretch your muscles often. Stretching will help you as you become more active. It can help you stay flexible, loosen tight muscles, and avoid injury. It can also help improve your balance and posture and can be a great way to relax.  Be sure to stretch the muscles you'll be using when you work out. It's best to warm your muscles slightly before you stretch them. Walk or do some other light aerobic activity for a few minutes, and then start stretching.  When you stretch your muscles:  ?? Do it slowly. Stretching is not about going fast or making sudden movements.  ?? Don't push or bounce during a stretch.  ?? Hold each stretch for at least 15 to 30 seconds, if you can. You should feel a stretch in the muscle, but not pain.  ?? Breathe out as you do the stretch. Then breathe in as you hold the stretch. Don't hold your breath.  If you're worried about how more activity might affect your health, have a checkup before you start. Follow any special advice your doctor gives you for getting a smart start.  Where can you learn more?  Go to [facility] at [email or web address] in the Menu. Enter  L875 in the search box to learn more about Learning About Being Physically Active.  Current as of: Oct 14, 2019??????????????????????????????Content Version: 13.1  ?? 2006-2021 [employer], [employer].   Care instructions adapted under license by [facility]. If you have questions about a medical condition or this instruction, always ask your healthcare professional. [employer] disclaims any warranty or liability for your use of this information.         Patient Education        Preventing Falls: Care Instructions  Your Care Instructions     Getting around your home safely can be a challenge if you have injuries or health problems that make it easy for you to fall. Loose rugs and furniture in walkways are among the dangers for many older people who have problems walking or who have poor eyesight. People who have conditions such as arthritis, osteoporosis, or dementia also have to be careful not to fall.  You can make your home safer with a few simple measures.  Follow-up care is a key part of your treatment and safety. Be sure to make and go to all appointments, and call your doctor if you are having problems. It's also a good idea to know your test results and keep a list of the medicines you take.  How can you care for yourself at home?  Taking care of yourself  ?? You may get dizzy if you do not drink enough water. To prevent dehydration, drink plenty of fluids. Choose water and other clear liquids. If you have kidney, heart, or liver disease and have to limit fluids, talk with your doctor before you increase the amount of fluids you drink.  ?? Exercise regularly to improve your strength, muscle tone, and balance. Walk if you can. Swimming may be a good choice if you cannot walk easily.  ?? Have your vision and hearing checked each year or any time you notice a change. If you have trouble seeing and hearing, you might not be able to avoid objects and could lose your balance.  ?? Know the side effects of the medicines you take. Ask your doctor or pharmacist whether the medicines you take can affect your balance. Sleeping pills or sedatives can affect your balance.  ?? Limit the amount of alcohol you drink. Alcohol can impair your balance and other senses.  ?? Ask your doctor whether calluses or corns on your feet need to be removed. If you wear loose-fitting shoes because of calluses or corns, you can lose your balance and fall.  ?? Talk to your doctor if you have numbness in your feet.  Preventing falls at home  ?? Remove raised doorway thresholds, throw rugs, and clutter. Repair loose carpet or raised areas in the floor.  ?? Move furniture and electrical cords to keep them out of walking paths.  ?? Use nonskid floor wax, and wipe up spills right away, especially on ceramic tile floors.  ?? If you use a M[place]ke or cane, put rubber tips on it. If you use crutches, clean the bottoms of them regularly with an abrasive pad, such as steel wool.  ?? Keep your house well lit, especially stairways, porches, and outside walkways. Use night-lights in areas such as hallways and bathrooms. Add extra light switches or use remote switches (such as switches that go on or off when you clap your hands) to make it easier to turn lights on if you have to get up during the night.  ?? Install sturdy handrails  on stairways.  ?? Move items in your cabinets so that the things you use a lot are on the lower shelves (about waist level).  ?? Keep a cordless phone and a flashlight with new batteries by your bed. If possible, put a phone in each of the main rooms of your house, or carry a cell phone in case you fall and cannot reach a phone. Or, you can wear a device around your neck or wrist. You push a button that sends a signal for help.  ?? Wear low-heeled shoes that fit well and give your feet good support. Use footwear with nonskid soles. Check the heels and soles of your shoes for wear. Repair or replace worn heels or soles.  ?? Do not wear socks without shoes on wood floors.  ?? Walk on the grass when the sidewalks are slippery. If you live in an area that gets snow and ice in the winter, sprinkle salt on slippery steps and sidewalks.  Preventing falls in the bath  ?? Install grab bars and nonskid mats inside and outside your shower or tub and near the toilet and sinks.  ?? Use shower chairs and bath benches.  ?? Use a hand-held shower head that will allow you to sit while showering.  ?? Get into a tub or shower by putting the weaker leg in first. Get out of a tub or shower with your strong side first.  ?? Repair loose toilet seats and consider installing a raised toilet seat to make getting on and off the toilet easier.  ?? Keep your bathroom door unlocked while you are in the shower.  Where can you learn more?  Go to [facility] at [email or web address] in the Menu. Enter G117 in the search box to learn more about Preventing Falls: Care Instructions.  Current as of: February 10, 2020??????????????????????????????Content Version: 13.1  ?? 2006-2021 [employer], [employer].   Care instructions adapted under license by [facility]. If you have questions about a medical condition or this instruction, always ask your healthcare professional. [employer] disclaims any warranty or liability for your use of this information.

## 2020-07-11 ENCOUNTER — Ambulatory Visit: Admit: 2020-07-11 | Discharge: 2020-07-12 | Payer: MEDICARE

## 2020-07-11 DIAGNOSIS — E1151 Type 2 diabetes mellitus with diabetic peripheral angiopathy without gangrene: Principal | ICD-10-CM

## 2020-07-11 DIAGNOSIS — L97501 Non-pressure chronic ulcer of other part of unspecified foot limited to breakdown of skin: Principal | ICD-10-CM

## 2020-07-11 DIAGNOSIS — E1140 Type 2 diabetes mellitus with diabetic neuropathy, unspecified: Principal | ICD-10-CM

## 2020-07-11 DIAGNOSIS — L97521 Non-pressure chronic ulcer of other part of left foot limited to breakdown of skin: Principal | ICD-10-CM

## 2020-07-11 DIAGNOSIS — L089 Local infection of the skin and subcutaneous tissue, unspecified: Principal | ICD-10-CM

## 2020-07-11 MED ORDER — LOSARTAN 25 MG TABLET
ORAL_TABLET | 3 refills | 0 days | Status: CP
Start: 2020-07-11 — End: ?

## 2020-07-11 MED ORDER — METOPROLOL SUCCINATE ER 100 MG TABLET,EXTENDED RELEASE 24 HR
ORAL_TABLET | 1 refills | 0 days | Status: CP
Start: 2020-07-11 — End: ?

## 2020-07-11 MED ORDER — PRAVASTATIN 20 MG TABLET
ORAL_TABLET | 3 refills | 0 days | Status: CP
Start: 2020-07-11 — End: ?

## 2020-07-11 NOTE — Unmapped (Signed)
Please see refill request. Thanks.
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
## 2020-07-11 NOTE — Unmapped (Signed)
Patient is requesting the following refill  Requested Prescriptions     Pending Prescriptions Disp Refills   ??? pravastatin (PRAVACHOL) 20 MG tablet [Pharmacy Med Name: PRAVASTATIN SODIUM 20 MG TAB] 90 tablet 1     Sig: TAKE ONE TABLET BY MOUTH EVERY DAY     Signed Prescriptions Disp Refills   ??? metoprolol succinate (TOPROL-XL) 100 MG 24 hr tablet 180 tablet 1     Sig: TAKE ONE TABLET TWICE DAILY     Authorizing Provider: Yehuda Budd     Ordering User: Garner Nash, Clessie Karras       Last OV: 12/08/2019     Last Virtual Visit: 06/09/2020     Next OV: 12/08/2020.     Labs:   Cholesterol:   Cholesterol (mg/dL)   Date Value   16/03/9603 122     Cholesterol, Total (mg/dL)   Date Value   54/02/8118 141   ,   Triglycerides   Date Value   02/09/2020 185 mg/dL (H)   14/78/2956 213 mg/dL (H)   08/65/7846 962   ,   HDL (mg/dL)   Date Value   95/28/4132 32 (L)   04/21/2015 36 (L)   ,   LDL Calculated (mg/dL)   Date Value   44/06/270 53   04/21/2015 74

## 2020-07-11 NOTE — Unmapped (Addendum)
WOUND CARE INSTRUCTIONS:        Wash area with Antibacterial liquid soap with one wash cloth, clean the soap off with a clean cloth, and then pat dry with a dry cloth before the dressing change.    Dressing: Left great toe: Apply xeroform to wound bed. Then cover with gauze, roll gauze and tape.    Change one to two times a day depending on how much drainage is coming from the wound.     Ensure that you are keeping pressure off of the wound      If you have any questions or concerns regarding your wound or wound care, please contact us at the [facility] clinic at [phone].       If your wound starts to develop the following , please call the [facility] for further advise:    ??  Increased drainage  ??  Redness around the wound  ??  Strong odor from the wound when changing the bandages  ??  Increased pain    Please do not hesitate to leave a voicemail on the nurse line. We make every effort to return your call the same day or the next day. Please leave a clear message with your name, date of birth,  and your medical record number. Leave a brief description of your problem.    If you are experiencing the following, please call us for advise or consider going to the nearest local Emergency Department or call 911.    ??  Fever of 100 F  ??  Nausea or Vomitting  ??  Pus draining from your wound  ??  Redness of the whole foot or leg  ??  Severe increase in pain above your baseline.    [facility]  [phone]

## 2020-07-13 NOTE — Unmapped (Signed)
[facility] Refill Coordination Note    Specialty Medication(s) to be Shipped:   Transplant: Myfortic 180mg  and tacrolimus 1mg     Other medication(s) to be shipped: No additional medications requested for fill at this time     David Ellis, DOB: Aug 27, 1945  Phone: [phone] (home)       All above HIPAA information was verified with patient.     Was a Nurse, learning disability used for this call? No    Completed refill call assessment today to schedule patient's medication shipment from the [facility] [phone]).       Specialty medication(s) and dose(s) confirmed: Regimen is correct and unchanged.   Changes to medications: [name] reports no changes at this time.  Changes to insurance: No  Questions for the pharmacist: No    Confirmed patient received Welcome Packet with first shipment. The patient will receive a drug information handout for each medication shipped and additional FDA Medication Guides as required.       DISEASE/MEDICATION-SPECIFIC INFORMATION        N/A    SPECIALTY MEDICATION ADHERENCE     Medication Adherence    Adherence tools used: patient uses a pill box to manage medications  Support network for adherence: family member                tacrolimus 1 mg: 10 days of medicine on hand   myfortic 180 mg: 10 days of medicine on hand         SHIPPING     Shipping address confirmed in Epic.     Delivery Scheduled: Yes, Expected medication delivery date: 02/15.     Medication will be delivered via Next Day Courier to the prescription address in Epic WAM.    Antonietta Barcelona   [facility] Specialty Technician

## 2020-07-18 MED FILL — MYFORTIC 180 MG TABLET,DELAYED RELEASE: ORAL | 30 days supply | Qty: 180 | Fill #7

## 2020-07-18 MED FILL — TACROLIMUS 1 MG CAPSULE, IMMEDIATE-RELEASE: ORAL | 30 days supply | Qty: 150 | Fill #2

## 2020-07-19 ENCOUNTER — Institutional Professional Consult (permissible substitution): Admit: 2020-07-19 | Discharge: 2020-07-19 | Payer: MEDICARE

## 2020-07-19 ENCOUNTER — Ambulatory Visit: Admit: 2020-07-19 | Discharge: 2020-07-19 | Payer: MEDICARE

## 2020-07-19 DIAGNOSIS — Z0000 Encounter for general adult medical examination without abnormal findings: Principal | ICD-10-CM

## 2020-07-19 DIAGNOSIS — I4891 Unspecified atrial fibrillation: Principal | ICD-10-CM

## 2020-07-19 LAB — PROTIME-INR
INR: 2.74
PROTIME: 31.7 s — ABNORMAL HIGH (ref 10.3–13.4)

## 2020-07-19 NOTE — Unmapped (Signed)
This auto-generated note displays abnormal results identified during the AWV Assessments. For full results, please see the Flowsheet Links under the Additional Documentation section of this encounter in Chart Review.          The patient reports they are currently: at home. I spent 30 minutes on the phone with the patient on the date of service. I spent an additional 15 minutes on pre- and post-visit activities on the date of service.     The patient was physically located in [place] or a state in which I am permitted to provide care. The patient and/or parent/guardian understood that s/he may incur co-pays and cost sharing, and agreed to the telemedicine visit. The visit was reasonable and appropriate under the circumstances given the patient's presentation at the time.    The patient and/or parent/guardian has been advised of the potential risks and limitations of this mode of treatment (including, but not limited to, the absence of in-person examination) and has agreed to be treated using telemedicine. The patient's/patient's family's questions regarding telemedicine have been answered.     If the visit was completed in an ambulatory setting, the patient and/or parent/guardian has also been advised to contact their provider???s office for worsening conditions, and seek emergency medical treatment and/or call 911 if the patient deems either necessary.          Location of Patient: Home    Unable to obtain in person vital signs due to COVID-19 public health emergency. When able, any self-performed vital signs were documented by ancillary staff and provider has reviewed and updated if needed.     Patient reported Vital Signs: Ht./Wt in flowsheet    Risks Identified:  BMI Abnormal and/or patient would benefit from meeting with an RD for Chronic Condition Management and Falls Risk        PCP notified of above risks by  Routing encounter    The following list of current providers and suppliers reviewed and updated this visit.  Patient Care Team:  Sharlee Blew, MD as PCP - General (Internal Medicine)  Amy Vinnie Langton, MD as PCP - Transplant Physician (Nephrology)  Sharlee Blew, MD as PCP - General-ATTRIBUTED  Randal Ewing Schlein, MD as Transplant Nephrologist (Nephrology)  Donzetta Kohut, MD (Endocrinology)  Yehuda Budd, MD as Consulting Physician (Cardiology)  Devona Konig, RN as Transplant Coordinator (Transplant Nephrology)  Osa Craver, MD as Consulting Physician (Dermatology)  Melvyn Novas, MD as Consulting Physician (Ophthalmology)    Medications and supplements were reviewed and updated this visit. See medication list in encounter summary.     A personalized prevent plan was updated and reviewed with the patient. A copy has been provided to the patient in Patient Instructions.    Recent Hospitalizations reviewed:  No recent hospitalizations    General Health:     Patient's BMI is Body mass index is 33.91 kg/m??.Patient answered No to nutrition services referral: patient and spouse state they have had nutrition training in the past, feel they have a good understanding of diabetic diet.         Pain identified during today's visit        07/19/20 0809   PainSc: 0-No pain            Safety:   The patient answered No to falling in the last year, and (!) Yes to feeling unsteady while standing or walking.  Falls Assessment Review    Fall Risk Assessment was  positive.  Falls risk interventions taken today were:  ?? Discussed interventions in home e.g. Shower chairs, grab bars, no loose rugs in the home  ?? Interventions have previously been addressed use cane as needed for safety, take care when walking on uneven ground. Patient has severe peripheral neuropathy, decreased feeling from knees down in both feet.     Falls Risk Assessment    Have you fallen in the past year? No (0)    Are you worried about falling?  No (0)    Do you feel unsteady when you am walking? Yes (1)     Do you use or have you been advised to use a cane or Mikhala Kenan to get around safely? Yes (1)     Do you steady yourself by holding onto furniture when walking at home? No (0)    Do you need to push with your hands to stand up from a chair? No (0)    Do you have some trouble stepping up onto a curb? No (0)    Do you often have to rush to the toilet? No (0)    Have you lost some feeling in your feet? Yes (1)     Do you take medications that sometimes make you feel light-headed or more tired than usual?  No (0)    Do you take medicine to help you sleep or improve your mood? No (0)    Do you often feel sad or depressed?  No (0)    Total score 3  (add 1 point to each yes answer, 4 or more points indicates risk for falls)            Patient answered one of the ADL/IADL's abnormally  Dressing: Independent    Grooming: Independent   Feeding: Independent    Bathing: Independent    Toileting: Independent    Household Duties (!) Medication Management      Continence (!) Urinary incontinence (occasionally)    PCP notified and patient's wife manages his medications since his stroke 3 years ago. He will discuss urine leakage with PCP at next visit.          Psychosocial Assessment: Patient lives with spouse in a private home. He is independent with ADLs. His spouse assists with medications and driving.     Informant's relationship to patient: Patient    These six questions ask how the patient is now compared to a few years ago.      Does the patient have more trouble remember things that have happened recently than s/he used to? No    Does he or she have more trouble recalling conversations a few days later? No    When speaking, does the patient have more difficulty in finding the right or tend to use the wrong words more often? Yes word-finding occasionally    Is the patient less able to manage money and financial affairs (e.g. paying bills, budgeting)? N/A    Is the patient less able to manage his or her medication independently? N/A    Does the patient need more assistance with transport (either private or public)? N/A    Total Score: 5    (To calculate score give one point for each No, Don't know, N/A answer given, 0 points for each Yes answer given.  Score of 0-3 is abnormal).     Social Determinants of Health     Tobacco Use: Low Risk    ??? Smoking Tobacco Use: Never Smoker   ???  Smokeless Tobacco Use: Never Used   Alcohol Use: Not At Risk   ??? How often do you have a drink containing alcohol?: Never   ??? How many drinks containing alcohol do you have on a typical day when you are drinking?: 1 - 2   ??? How often do you have 5 or more drinks on one occasion?: Never   Financial Resource Strain: Low Risk    ??? Difficulty of Paying Living Expenses: Not very hard   Food Insecurity: No Food Insecurity   ??? Worried About Running Out of Food in the Last Year: Never true   ??? Ran Out of Food in the Last Year: Never true   Transportation Needs: No Transportation Needs   ??? Lack of Transportation (Medical): No   ??? Lack of Transportation (Non-Medical): No   Physical Activity: Sufficiently Active   ??? Days of Exercise per Week: 5 days   ??? Minutes of Exercise per Session: 30 min   Stress: No Stress Concern Present   ??? Feeling of Stress : Only a little   Social Connections: Moderately Integrated   ??? Frequency of Communication with Friends and Family: More than three times a week   ??? Frequency of Social Gatherings with Friends and Family: Once a week   ??? Attends Religious Services: 1 to 4 times per year   ??? Active Member of Clubs or Organizations: No   ??? Attends Banker Meetings: Never   ??? Marital Status: Married   Catering manager Violence: Not At Risk   ??? Fear of Current or Ex-Partner: No   ??? Emotionally Abused: No   ??? Physically Abused: No   ??? Sexually Abused: No   Depression: Not at risk   ??? PHQ-2 Score: 0   Housing/Utilities: Low Risk    ??? Within the past 12 months, have you ever stayed: outside, in a car, in a tent, in an overnight shelter, or temporarily in someone else's home (i.e. couch-surfing)?: No   ??? Are you worried about losing your housing?: No   ??? Within the past 12 months, have you been unable to get utilities (heat, electricity) when it was really needed?: No   Substance Use: Low Risk    ??? Taken prescription drugs for non-medical reasons: Never   ??? Taken illegal drugs: Never   ??? Patient indicated they have taken drugs in the past year for non-medical reasons: Yes, [positive answer(s)]: Not on file   Health Literacy: Low Risk    ??? : Never       Social Determinants of Health:  Social Determinants of Health Screened today.  Interventions Provided: N/A; No issues identified.    This patient is currently receiving Embedded Case Management through Case Management Services in their PCP office.     Primary Case Manager: Donata Clay, RN    Primary Case Manager Phone #: [phone]  Please contact CM for care plan changes, updates or recent discharges.    Follow up: with PCP    Patient's Primary Concern is/goals are: Maintain Health, Aging safely in the home and Maintain independent living  Interventions provided: Contact Information Provided and Supportive Listening   Current services: Primary and specialist care    High Risk Drivers: Multiple Complex Diagnoses  Primary Disease Process: DM, HTN and h/o kidney transplant  Barriers: none noted  Strengths: Family connection, Positive relationship with PCP and Positive relationship with specialists  Supports: Spouse/Partner and Adult Children  Current Residence: Home with Spouse  Primary  Medical Home: [name], MD???s office  [facility] [employer]

## 2020-07-19 NOTE — Unmapped (Signed)
ADVANCE CARE PLANNING NOTE    Discussion Date:  July 19, 2020    Patient has decisional capacity:  Yes    Patient has selected a Health Care Decision-Maker if loses capacity: Yes    Health Care Decision Maker as of 07/19/2020    HCDM [facility]): Jeriko, [place] - Spouse - [phone]    Discussion Participants:  Patient and care manager     Communication of Medical Status/Prognosis:   Patient states current health is good, patient follows up regularly with PCP and specialists for all chronic medical problems.     Communication of Treatment Goals/Options:   Patient has Living Will and HPOA, will bring for scanning at next follow up visit.    Treatment Decisions:   07/19/2020    [name], MD  was present and immediately available in office suite.    The patient reports they have a Healthcare power of attorney but have not brought a copy to the office.  Have discussed the importance of having this included in the medical record. .      Case Manager facilitated discussion with patient and no visitors about advance care planning and documentation including Healthcare power of attorney . The patient voluntarily agreed to bring to office Healthcare power of attorney.   Note: forms that require notarization require two witnessess that are non-family members nor health care workers.          I spent between 1-15 minutes providing voluntary advance care planning services for this patient.

## 2020-07-21 NOTE — Unmapped (Signed)
Pt notified of results of INR and need to schedule next one for 1 month and continue same dose. Pt verbalized understanding.

## 2020-07-25 DIAGNOSIS — G629 Polyneuropathy, unspecified: Principal | ICD-10-CM

## 2020-07-25 MED ORDER — GABAPENTIN 300 MG CAPSULE
ORAL_CAPSULE | Freq: Two times a day (BID) | ORAL | 3 refills | 90 days | Status: CP
Start: 2020-07-25 — End: 2021-07-25

## 2020-07-26 ENCOUNTER — Ambulatory Visit: Admit: 2020-07-26 | Discharge: 2020-07-27 | Payer: MEDICARE

## 2020-07-26 DIAGNOSIS — H4311 Vitreous hemorrhage, right eye: Principal | ICD-10-CM

## 2020-07-26 DIAGNOSIS — Z961 Presence of intraocular lens: Principal | ICD-10-CM

## 2020-07-26 DIAGNOSIS — H04123 Dry eye syndrome of bilateral lacrimal glands: Principal | ICD-10-CM

## 2020-07-26 DIAGNOSIS — H35373 Puckering of macula, bilateral: Principal | ICD-10-CM

## 2020-07-26 DIAGNOSIS — E113593 Type 2 diabetes mellitus with proliferative diabetic retinopathy without macular edema, bilateral: Principal | ICD-10-CM

## 2020-07-26 DIAGNOSIS — H4312 Vitreous hemorrhage, left eye: Principal | ICD-10-CM

## 2020-07-26 NOTE — Unmapped (Signed)
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  I reviewed the resident???s note.  I agree with the resident???s findings and plan including interpretation of images.     EXTENDED OPHTHALMOSCOPY RESULTS:  OD: MAs, PRP 360  OS: MAs, PRP 360

## 2020-07-28 ENCOUNTER — Ambulatory Visit: Admit: 2020-07-28 | Discharge: 2020-07-28 | Payer: MEDICARE | Attending: "Endocrinology | Primary: "Endocrinology

## 2020-07-28 ENCOUNTER — Ambulatory Visit: Admit: 2020-07-28 | Discharge: 2020-07-28 | Payer: MEDICARE

## 2020-07-28 DIAGNOSIS — E1129 Type 2 diabetes mellitus with other diabetic kidney complication: Principal | ICD-10-CM

## 2020-07-28 DIAGNOSIS — E1165 Type 2 diabetes mellitus with hyperglycemia: Principal | ICD-10-CM

## 2020-07-28 DIAGNOSIS — L97421 Non-pressure chronic ulcer of left heel and midfoot limited to breakdown of skin: Principal | ICD-10-CM

## 2020-07-28 DIAGNOSIS — E1151 Type 2 diabetes mellitus with diabetic peripheral angiopathy without gangrene: Principal | ICD-10-CM

## 2020-07-28 DIAGNOSIS — L97521 Non-pressure chronic ulcer of other part of left foot limited to breakdown of skin: Principal | ICD-10-CM

## 2020-07-28 MED ORDER — DOXYCYCLINE HYCLATE 100 MG TABLET
ORAL_TABLET | Freq: Two times a day (BID) | ORAL | 0 refills | 10.00000 days | Status: CP
Start: 2020-07-28 — End: 2020-08-07

## 2020-07-28 MED ORDER — SILVER SULFADIAZINE 1 % TOPICAL CREAM
Freq: Every day | TOPICAL | 1 refills | 0.00000 days | Status: CP
Start: 2020-07-28 — End: 2021-07-28

## 2020-07-28 MED ORDER — TRULICITY 1.5 MG/0.5 ML SUBCUTANEOUS PEN INJECTOR
SUBCUTANEOUS | 3 refills | 84 days | Status: CP
Start: 2020-07-28 — End: ?

## 2020-07-28 NOTE — Unmapped (Unsigned)
ASSESSMENT / PLAN  1. Type II diabetes mellitus with renal manifestations, uncontrolled (CMS-HCC)  Maintained on u500 insulin, GLP1a (when he has insurance coverage) and Libre CGM.   Unfortunately, no glucose data to review today.  If affordable, encourage continued use of Trulicity with added weight contro:  - dulaglutide (TRULICITY) 1.5 mg/0.5 mL PnIj; Inject 0.5 mL (1.5 mg total) under the skin every seven (7) days.  Dispense: 6 mL; Refill: 3    MEDICARE:  *The patient is currently using the therapeutic CGM as prescribed.  *The patient has diabetes.  *The patient is insulin-treated with 3 or more daily administrations of insulin (MDII) or a continuous subcutaneous insulin infusion (CSII) pump prior to use of a CGM.   *The patient???s insulin treatment regimen requires frequent adjustments by the patient based on sliding scale.  *Within the last six months, I had an in-person visit with the beneficiary to evaluate their diabetes control and determine that the above criteria are met.   *Every six months I will continue to have an in-person visit with the beneficiary to assess adherence to their CGM regimen and diabetes treatment plan.      Return in about 6 months (around 01/25/2021).     SUBJECTIVE  PCP: [name]  Tplant: Amy Mottle  Optho:  Herma Ard  Wound: Keagy  Podiatry: Dr. Ether Griffins, [facility]   Derm: Davy Pique, [facility]    HPI: David Ellis is a 75 y.o. male who I last saw 01/2020 returns for follow up of T2DM with hx of kidney transplant.    Does not have freestyle w him for download today. Uses reader, not phone.   His freestyle does not alert him to highs/lows.   He received new reader 06/2020 from [place], and has been using, but having difficulty setting up so that he has not been able to upload at home.    With refill in Feb, Trulicity costs shot up from $37 to > $200 a month for pt costs. If this remains, they will stop using.   Unfortunate, as he initially had sig weight loss down to 237 lbs on Trulicity.    U500 remains covered:   Bfast  19 clicks (95units).  Lunch 8 clicks (45units)  Dinner 3-6 clicks (15-30units) -- If glucose < 90 pre dinner, will not take this dose.  If glucoses 90s, only 3 units.   If glucose is < 80 before the meal, reduces dose by 50%.   If glucose is <150 at bedtime, have low carb snack such as handful nuts, piece cheese, spoon of PNbutter.     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 Optho 07/2020  CKD followed by Nephrology. Has restarted ARB.  Peripheral neuropathy w hx of amputation.  On Neurontin. 07/2020 Wound Care for burn on distal toe. Follow up with them today.  BP tightly controlled. BP low normal. Denies dizzyness on standing. Tolerating restart of low dose ARB. Prn lasix for edema  Lipids on low dose pravastatin 20.      Endocrine Obtained / Updated Problem List:   1. T2DM dx 1992, insulin roughly 2000:   ?? Proliferative DR s/p hx vitreous hemorrhage and endolaser therapy. Exam 01/2020.  ?? CKD s/p renal transplant  ?? 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 No monofilament 06/2019  ?? Hypoglycemic emergency requiring EMS 03/2011.   ?? Historically, Victoza, Ozempic, Trulicity were not covered or too $$.   ?? Offered to check  cpeptide to see if he would qualify for insulin pump under medcare, though he is not interested.    ?? Off MTF since end of 2015/early 2016.   2. 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   3. HTN   4. Nephrolithiasis   5. OSA on cpap   6. Aflutter s/p ablation 06/2011, atrial fibrillation  7. PE    Patient Active Problem List   Diagnosis   ??? Type II diabetes mellitus with renal manifestations, uncontrolled (CMS-HCC)   ??? Essential (primary) hypertension   ??? History of kidney transplant   ??? Mixed hyperlipidemia   ??? 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 Transplanted kidney   ??? Other pulmonary embolism without acute cor pulmonale (CMS-HCC)   ??? Edema   ??? Right ventricular dysfunction   ??? Atrial fibrillation (CMS-HCC)   ??? Aftercare following organ transplant   ??? Diverticulosis of colon   ??? Routine general medical examination at a health care facility   ??? Diabetic polyneuropathy associated with type 2 diabetes mellitus (CMS-HCC)   ??? Diabetic ulcer of left foot associated with type 2 diabetes mellitus (CMS-HCC)   ??? Toe amputation status   ??? Left knee pain   ??? BCC (basal cell carcinoma), face   ??? Acquired absence of other toe(s), unspecified side (CMS-HCC)   ??? History of colonic polyps   ??? Type 2 diabetes mellitus with foot ulcer (CODE) (CMS-HCC)   ??? 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 Hypoglycemia associated with type 2 diabetes mellitus (CMS-HCC)   ??? Class 2 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   ??? Immunosuppressed status (CMS-HCC)   ??? Syncope     Past Medical History:   Diagnosis Date   ??? Atrial flutter (CMS-HCC)    ??? Diabetes mellitus (CMS-HCC)    ??? Diabetic nephropathy (CMS-HCC)    ??? Diabetic retinopathy (CMS-HCC)    ??? Fractures    ??? Ganglion cyst    ??? Hand injury    ??? Heart disease    ??? Hyperlipidemia    ??? Hypertension    ??? Joint pain    ??? Osteomyelitis (CMS-HCC) June 2016   ??? Pulmonary embolism (CMS-HCC) Dec 2015   ??? 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 Stroke (CMS-HCC)    ??? Tear of meniscus of knee    ??? Transplanted kidney 08/21/2011     Medicines:     Current Outpatient Medications   Medication Sig Dispense Refill   ??? aspirin (ECOTRIN) 81 MG tablet Take 81 mg by mouth daily as needed (when flying).     ??? blood-glucose meter kit Use as instructed. 1 each 0   ??? cefdinir (OMNICEF) 300 MG capsule Take 1 capsule (300 mg total) by mouth Two (2) times a day. (Patient not taking: Reported on 07/19/2020) 28 capsule 0   ??? cholecalciferol, vitamin D3, (VITAMIN D3) 2,000 unit Tab Take 2,000 Units by mouth daily.      ??? furosemide (LASIX) 40 MG tablet Take 1-2 tablets daily as needed for edema. 180 tablet 3   ??? gabapentin (NEURONTIN) 300 MG capsule Take 1 capsule (300 mg total) by mouth Two (2) times a day. 180 capsule 3   ??? HUMULIN R U-500, CONC, KWIKPEN 500 unit/mL (3 mL) CONCENTRATED injection DIAL TO MAX 100 UNITS BEFORE BREAKFAST, LUNCH AND DINNER. INJECT 30  MINUTES BEFORE MEALS. MAX OF 300 UNITS DAILY. 18 mL 11   ??? losartan (COZAAR) 25 MG tablet TAKE ONE TABLET BY MOUTH EVERY DAY 90 tablet 3   ??? magnesium chloride (SLOW_MAG) 64 mg TbEC Take 128 mg by mouth three (3) times a day (at 6am, noon and 6pm).      ??? metoprolol succinate (TOPROL-XL) 100 MG 24 hr tablet TAKE ONE TABLET TWICE DAILY 180 tablet 1   ??? miscellaneous medical supply Misc 1 application by Miscellaneous route daily as needed. Lightweight wheelchair. 1 each 0   ??? miscellaneous medical supply Misc 1 application by Miscellaneous route nightly. Recommend CPAP 14 cm H2O with EPR 3 and heated humidifier for nasal dryness, mask: ResMed Activa Lt nasal mask (Large). 1 each 0   ??? multivitamin capsule Take 1 capsule by mouth daily.     ??? MYFORTIC 180 mg EC tablet Take 3 tablets (540 mg total) by mouth two (2) times a day. 540 tablet 3   ??? pen needle, diabetic (BD ULTRA-FINE NANO PEN NEEDLE) 32 gauge x 5/32 (4 mm) Ndle 1 each by Other route Three (3) times a day before meals. 300 each 2   ??? pravastatin (PRAVACHOL) 20 MG tablet TAKE ONE TABLET BY MOUTH EVERY DAY 90 tablet 3   ??? tacrolimus (PROGRAF) 1 MG capsule Take 3 capsules (3 mg total) by mouth in the morning AND 2 capsules (2 mg total) in the evening. 450 capsule 3   ??? TRULICITY 1.5 mg/0.5 mL PnIj INJECT 1.5MG (0.5ML) SUBCUTANEOUSLY ONCE A WEEK 2 mL 5   ??? warfarin (COUMADIN) 2.5 MG tablet Take 1 tablet (2.5 mg total) by mouth daily. (Patient taking differently: Take 2.5 mg by mouth once a week. On Mondays 2.5mg  PO) 30 tablet 11   ??? warfarin (JANTOVEN) 5 MG tablet TAKE 5 DAYS A WEEK (Patient taking differently: Take by mouth daily with evening meal. TAKE 6 DAYS A WEEK - Tuesday through Sunday) 30 tablet 6     No current facility-administered medications for this visit.     Facility-Administered Medications Ordered in Other Visits   Medication Dose Route Frequency Provider Last Rate Last Admin   ??? bevacizumab    PRN (once a day) Melvyn Novas, MD   2.5 mg at 01/28/17 1610       Social History: reviewed: his wife was kidney donor. rare etoh. no cig. 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     Other past medical history, medications, allergies and problem list are reviewed in the medical record    REVIEW OF SYSTEMS: Pertinent positives and negatives as in HPI, otherwise, all other systems reviewed are negative.     OBJECTIVE   Vitals:    07/28/20 1002   BP: 97/53   Pulse: 79   Resp: 18   Temp: 36.6 ??C (97.9 ??F)   Weight: (!) 116.8 kg (257 lb 9.6 oz)   Height: 185.4 cm (6' 1)     Body mass index is 33.99 kg/m??.    BP Readings from Last 3 Encounters:   07/28/20 97/53   07/11/20 157/76   06/28/20 118/78     Wt Readings from Last 3 Encounters:   07/28/20 (!) 116.8 kg (257 lb 9.6 oz)   07/19/20 (!) 116.6 kg (257 lb)   06/28/20 (!) 117.4 kg (258 lb 12.8 oz)     GENERAL: well appearing  PSYCH: calm, cooperative with full affect  EYES: eomi, sclera anicteric  CV: sinus rate, regular rhythm  RESP: breathing nonlabored  Foot exam deferred with recent podiatry    Pertinent labs:  Office Visit on 07/28/2020   Component Date Value Ref Range Status   ??? Aerobic Culture 07/28/2020 SKIN FLORA ISOLATED   Final   ??? Gram Stain Result 07/28/2020 No polymorphonuclear leukocytes seen   Final   ??? Gram Stain Result 07/28/2020 No organisms seen   Final   ??? Aerobic Culture 07/28/2020 Coagulase negative Staphylococcus species (A)  Final    Skin flora component   ??? Aerobic Culture 07/28/2020 <1+ Skin Flora Isolated   Final   ??? Gram Stain Result 07/28/2020 No polymorphonuclear leukocytes seen   Final   ??? Gram Stain Result 07/28/2020 No organisms seen   Final   Office Visit on 07/28/2020   Component Date Value Ref Range Status   ??? Glucose, POC 07/28/2020 210 (A) 70 - 179 mg/dL Final   ??? HGB W2N, POC 07/28/2020 8.8 (A) <7.0 % Final    A1c Glycemic Goal: <7.0%     **Goals should be individualized; more or less stringent A1c glycemic goals may be appropriate for individual patients.      (Adopted from: 2020 ADA Standards of Medical Care In Diabetes)   Point of Care A1c testing is not FDA-approved for the diagnosis of Diabetes.    ??? EST AVERAGE GLUCOSE, POC 07/28/2020 206  mg/dL Final   Appointment on 07/19/2020   Component Date Value Ref Range Status   ??? PT 07/19/2020 31.7 (A) 10.3 - 13.4 sec Final   ??? INR 07/19/2020 2.74   Final   Appointment on 07/07/2020   Component Date Value Ref Range Status   ??? PT 07/07/2020 28.3 (A) 10.3 - 13.4 sec Final   ??? INR 07/07/2020 2.44   Final   ??? Sodium 07/07/2020 137  135 - 145 mmol/L Final   ??? Potassium 07/07/2020 4.4  3.4 - 4.5 mmol/L Final   ??? Chloride 07/07/2020 101  98 - 107 mmol/L Final   ??? CO2 07/07/2020 31.7 (A) 20.0 - 31.0 mmol/L Final   ??? Anion Gap 07/07/2020 4 (A) 5 - 14 mmol/L Final   ??? BUN 07/07/2020 23  9 - 23 mg/dL Final   ??? Creatinine 07/07/2020 1.22 (A) 0.60 - 1.10 mg/dL Final   ??? BUN/Creatinine Ratio 07/07/2020 19   Final   ??? EGFR CKD-EPI Non-African American,* 07/07/2020 59 (A) >=60 mL/min/1.59m2 Final   ??? EGFR CKD-EPI African American, Male 07/07/2020 68  >=60 mL/min/1.10m2 Final   ??? Glucose 07/07/2020 319 (A) 70 - 179 mg/dL Final   ??? Calcium 56/21/3086 9.5  8.7 - 10.4 mg/dL Final   ??? Magnesium 57/84/6962 1.8  1.6 - 2.6 mg/dL Final   ??? Phosphorus 07/07/2020 3.5  2.4 - 5.1 mg/dL Final   ??? Tacrolimus, Timed 07/07/2020 4.7  ng/mL Final   ??? WBC 07/07/2020 6.2  3.5 - 10.5 10*9/L Final   ??? RBC 07/07/2020 5.37  4.32 - 5.72 10*12/L Final   ??? HGB 07/07/2020 15.0  13.5 - 17.5 g/dL Final   ??? HCT 95/28/4132 45.6  38.0 - 50.0 % Final   ??? MCV 07/07/2020 84.9  81.0 - 95.0 fL Final   ??? MCH 07/07/2020 27.8  26.0 - 34.0 pg Final   ??? MCHC 07/07/2020 32.8  30.0 - 36.0 g/dL Final   ??? RDW 44/06/270 14.0  12.0 - 15.0 % Final   ??? MPV 07/07/2020 7.8  7.0 - 10.0 fL Final   ??? Platelet 07/07/2020 187  150 - 450 10*9/L Final   ???  nRBC 07/07/2020 0  <=4 /100 WBCs Final   ??? Neutrophils % 07/07/2020 70.4  % Final   ??? Lymphocytes % 07/07/2020 19.4  % Final   ??? Monocytes % 07/07/2020 7.7  % Final   ??? Eosinophils % 07/07/2020 1.8  % Final   ??? Basophils % 07/07/2020 0.7  % Final   ??? Absolute Neutrophils 07/07/2020 4.4  1.7 - 7.7 10*9/L Final   ??? Absolute Lymphocytes 07/07/2020 1.2  0.7 - 4.0 10*9/L Final   ??? Absolute Monocytes 07/07/2020 0.5  0.1 - 1.0 10*9/L Final   ??? Absolute Eosinophils 07/07/2020 0.1  0.0 - 0.7 10*9/L Final   ??? Absolute Basophils 07/07/2020 0.0  0.0 - 0.1 10*9/L Final   Appointment on 07/05/2020   Component Date Value Ref Range Status   ??? Magnesium 07/05/2020 1.8  1.6 - 2.6 mg/dL Final   ??? PT 57/84/6962 30.2 (A) 10.3 - 13.4 sec Final   ??? INR 07/05/2020 2.61   Final     Lab Results   Component Value Date    Hemoglobin A1C 7.4 (H) 02/09/2020    Hemoglobin A1C 8.3 (H) 08/21/2019    Hemoglobin A1C 6.5 (H) 04/13/2019    Hemoglobin A1C 8.1 (H) 01/07/2019    HB A1C, RAP/HGATE 6.7 (H) 04/07/2012    HB A1C, RAP/HGATE 7.4 (H) 03/30/2011    HGB A1C, POC 8.8 (H) 07/28/2020    HGB A1C, POC 7.3 (H) 01/27/2020    HGB A1C, POC 9.5 (H) 11/18/2018    HGB A1C, POC 9.3 (H) 04/24/2018    Hemoglobin A1c 7.6 (H) 05/17/2014     No components found for: SODIUM  Lab Results   Component Value Date    K 4.4 07/07/2020     Lab Results   Component Value Date    CREATININE 1.22 (H) 07/07/2020     Lab Results   Component Value Date    PTH 114.5 (H) 08/21/2019    CALCIUM 9.5 07/07/2020     Lab Results   Component Value Date    ALT 21 07/05/2020    ALT 22 04/21/2015     Lab Results   Component Value Date    CHOL 122 02/09/2020     Lab Results   Component Value Date    LDL 53 02/09/2020     Lab Results   Component Value Date    HDL 32 (L) 02/09/2020     Lab Results   Component Value Date    TRIG 185 (H) 02/09/2020     Lab Results   Component Value Date TSH 2.202 07/02/2017    TSH 1.840 07/13/2015    TSH 1.420 07/09/2015     Lab Results   Component Value Date    TSH 2.202 07/02/2017     No results found for: CBC  No results found for: VITAMINB12  Lab Results   Component Value Date    VITD2 <5 08/17/2015    VITD3 42 08/17/2015    VITDTOTAL 30.6 08/21/2019     No results found for: Christa See, MSHCGMOM, MALBCRERAT  Lab Results   Component Value Date    Protein, Ur 104.0 08/25/2019    Protein, Ur 15 08/05/2014    Protein/Creatinine Ratio, Urine 0.717 08/25/2019    Protein/Creatinine Ratio, Urine 0.116 08/05/2014       IMAGING: Component Value Date    TSH 2.202 07/02/2017    TSH 1.840 07/13/2015    TSH 1.420 07/09/2015     Lab Results  Component Value Date    TSH 2.202 07/02/2017     No results found for: CBC  No results found for: VITAMINB12  Lab Results   Component Value Date    VITD2 <5 08/17/2015    VITD3 42 08/17/2015    VITDTOTAL 30.6 08/21/2019     No results found for: Christa See, MSHCGMOM, MALBCRERAT  Lab Results   Component Value Date    Protein, Ur 104.0 08/25/2019    Protein, Ur 15 08/05/2014    Protein/Creatinine Ratio, Urine 0.717 08/25/2019    Protein/Creatinine Ratio, Urine 0.116 08/05/2014       IMAGING:

## 2020-07-28 NOTE — Unmapped (Signed)
Pt didn't bring device to the clinic. POC glucose and HgA1c today: PP 7:30am, 210mg /dL.

## 2020-07-28 NOTE — Unmapped (Addendum)
WOUND CARE INSTRUCTIONS:        Wash area with Antibacterial liquid soap with one wash cloth, clean the soap off with a clean cloth, and then pat dry with a dry cloth before the dressing change.    Dressing: Left great toe and Left Heel: Apply a thin layer of silvadene cream to the wound bed and apply Alginate on top. Then cover with ABD pad, secure with roll gauze and wrap with a light Ace Wrap from the base of the toes to the bend of the knee. Remove ace wrap at night when you are in bed sleeping. Put back on in the morning.     Change dressing once a day.     Ensure that you are keeping pressure off of the wound      If you have any questions or concerns regarding your wound or wound care, please contact us at the [facility] clinic at [phone].       If your wound starts to develop the following , please call the [facility] for further advise:    ??  Increased drainage  ??  Redness around the wound  ??  Strong odor from the wound when changing the bandages  ??  Increased pain    Please do not hesitate to leave a voicemail on the nurse line. We make every effort to return your call the same day or the next day. Please leave a clear message with your name, date of birth,  and your medical record number. Leave a brief description of your problem.    If you are experiencing the following, please call us for advise or consider going to the nearest local Emergency Department or call 911.    ??  Fever of 100 F  ??  Nausea or Vomitting  ??  Pus draining from your wound  ??  Redness of the whole foot or leg  ??  Severe increase in pain above your baseline.    [facility]  [phone]    Harvin Hazel Sluss PA-C    07/28/2020

## 2020-07-28 NOTE — Unmapped (Signed)
[place] Division of Vascular Surgery  [facility] - Established Clinic Visit  [address], [address]  [place], [place]  [address]      Past Medical History:   Diagnosis Date   ??? Atrial flutter (CMS-HCC)    ??? Diabetes mellitus (CMS-HCC)    ??? Diabetic nephropathy (CMS-HCC)    ??? Diabetic retinopathy (CMS-HCC)    ??? Fractures    ??? Ganglion cyst    ??? Hand injury    ??? Heart disease    ??? Hyperlipidemia    ??? Hypertension    ??? Joint pain    ??? Osteomyelitis (CMS-HCC) June 2016   ??? Pulmonary embolism (CMS-HCC) Dec 2015   ??? 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 Stroke (CMS-HCC)    ??? Tear of meniscus of knee    ??? 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  ##   L foot wound  ?? Heater burn   ?? Superficial wound, w/ good granulation tissue  ?? No purulence  ?? Has DM neuropathy  ?? Develop cellulitis and was started on cefdinir on 07/05/20 (end date 07/18/20)      On today's visit, 07/28/2020, David Ellis returns for a follow up.  He continues to have an ulcer at the tip of the left great toe which has enlarged since his last visit on February 7.  He has already developed a new blistered area on his left posterior heel secondary to a shoe rubbing on his foot in the setting of increased peripheral edema.  He is on Lasix and takes 1 tablet/day.  The blister was noted over the weekend.  His wife started covering the blister with a bordered bandage and he has started using his offloading shoe.  No increased pain.    ROS: Denies recent fevers, shaking chills, or night sweats.             Denies any increased drainage or exudate from the wound.             No reports of increased pain from the wound. No reports of increased edema of the lower extremity.             Denies any claudication or rest pain symptoms.             Denies any current shortness of breath or chest pain.             Remainder of 10 system review is negative except that mentioned in the history of present illness.    Physical exam    General: 75 y.o. year old male in no acute distress in the clinic today.    Vitals:    07/28/20 1117   Temp: 36.4 ??C (97.6 ??F)       Wound 07/11/20 Left Great Toe (Active)   Wound Image   07/28/20 1118   Wound Status Not Healed 07/28/20 1118   Pain 0 07/28/20 1118   Dressing Status      Removed;Other (Comment) 07/28/20 1118   Wound Length (cm) 0.9 cm 07/28/20 1118   Wound Width (cm) 2 cm  07/28/20 1118   Wound Depth (cm) 0.1 cm 07/28/20 1118   Wound Surface Area (cm^2) 1.8 cm^2 07/28/20 1118   Wound Volume (cm^3) 0.18 cm^3 07/28/20 1118   Wound Healing % -84 07/28/20 1118   Wound Bed Pink;Red 07/28/20 1118   Odor None 07/28/20 1118   Margins Defined edges 07/28/20 1118   Peri-wound Assessment      Edema;Callus 07/28/20 1118   Encounter Subsequent 07/28/20 1118   Slough % None 07/11/20 0918   Eschar % None 07/11/20 0918   Epithelialization % 1-25% 07/28/20 1118   Granulation % 76-100% 07/28/20 1118   Exudate Type      Sero-sanguineous 07/28/20 1118   Exudate Amnt      Moderate 07/28/20 1118   Thickness Full Thickness 07/28/20 1118   Paring No 07/28/20 1118   Texture Edema;Callus 07/28/20 1118   Moisture Normal For Patient 07/28/20 1118   Temperature Warm 07/11/20 0918   Hypergranuation No 07/28/20 1118   Tunneling      No 07/28/20 1118   Undermining     No 07/28/20 1118   Sinus Tract No 07/28/20 1118   Treatments Cleansed/Irrigation 07/28/20 1118   Picture Taken Yes 07/28/20 1118   Length (Pre Debridement) 0.9 cm 07/28/20 1200   Width (Pre Debridement) 2 cm 07/28/20 1200   Depth (Pre Debridement) 0.1 cm 07/28/20 1200   Pre-Procedure Pain 0 07/28/20 1200   Time Out Taken Yes 07/28/20 1200   Instrument Used forceps;scissors 07/28/20 1200   Tissue/Material Removed non-viable;callus;slough;biofilm 07/28/20 1200   Bleeding minimal 07/28/20 1200   Bleeding controlled with pressure 07/28/20 1200   Specimen Taken culture by swab 07/28/20 1200   Type of Debridement selective 07/28/20 1200   Level of Debridement skin, dermis 07/28/20 1200   Procedural Pain 0 07/28/20 1200   Length (Post-Debridement) 0.9 cm 07/28/20 1200   Width (Post Debridement) 2 cm 07/28/20 1200   Depth (Post-Debridement) 0.1 cm 07/28/20 1200   Post Procedural Pain 0 07/28/20 1200   Area(Pre-Debridement) 1.8 sq cm 07/28/20 1200   Area(Post-Debridement) 1.8 sq cm 07/28/20 1200   Volume(Pre-Debridement) 0.18 sq cm 07/28/20 1200   Volume(Post-Debridement) 0.18 sq cm 07/28/20 1200       Wound Foot Left heel (Active)   Wound Image   07/28/20 1200   Wound Status Not Healed 07/28/20 1118   Pain 0 07/28/20 1118   Dressing Status      Removed;Other (Comment) 07/28/20 1118   Wound Length (cm) 0 cm 07/28/20 1118   Wound Width (cm) 0 cm 07/28/20 1118   Wound Depth (cm) 0 cm 07/28/20 1118   Wound Surface Area (cm^2) 0 cm^2 07/28/20 1118   Wound Volume (cm^3) 0 cm^3 07/28/20 1118   Treatments Cleansed/Irrigation 07/28/20 1118   Picture Taken Yes 07/28/20 1118   Length (Pre Debridement) 0 cm 07/28/20 1200   Width (Pre Debridement) 0 cm 07/28/20 1200   Depth (Pre Debridement) 0 cm 07/28/20 1200   Pre-Procedure Pain 0 07/28/20 1200   Time Out Taken Yes 07/28/20 1200   Instrument Used forceps;scissors 07/28/20 1200   Tissue/Material Removed callus;non-viable;slough;biofilm 07/28/20 1200   Bleeding minimal 07/28/20 1200   Bleeding controlled with pressure 07/28/20 1200   Specimen Taken culture by swab 07/28/20 1200   Type of Debridement selective 07/28/20 1200   Level of Debridement skin, dermis 07/28/20 1200   Procedural Pain 0 07/28/20 1200   Length (Post-Debridement) 4 cm 07/28/20 1200   Width (Post Debridement) 2.6 cm 07/28/20  1200   Depth (Post-Debridement) 0.1 cm 07/28/20 1200   Post Procedural Pain 0 07/28/20 1200   Area(Pre-Debridement) 0 sq cm 07/28/20 1200   Area(Post-Debridement) 10.4 sq cm 07/28/20 1200   Volume(Pre-Debridement) 0 sq cm 07/28/20 1200   Volume(Post-Debridement) 1.04 sq cm 07/28/20 1200          Chest: No increased work of breathing on room air.    Cardiac: regular rate and rhythm.    Vascular: Pedal pulses nonpalpable.  Brisk audible Doppler signals for DP and PT pulses.    Extremities: He has a lateral foot deformity for the left foot secondary to toe amputations of the fourth and fifth toe and partial metatarsal resection.  He has moderate peripheral edema.  No calf pain or tenderness upon palpation.  Negative Homans test.  There is a full-thickness ulcer now at the tip of the left great toe and a flaccid blister at the posterior left heel with a portal entry to the deeper tissues.  Debridement performed for each area as described below.  Post debridement measurements are as above.  The tissues are healthy and well granulated but there is new erythema of the left great toe which is concerning.    Procedure: CPT 97597, selective  Based on the necrotic tissue and biofilm present, the decision was made to debride. All Benefits, risks, and alternatives were explained in detail to the patient. A time out was taken prior to the procedure. The area was prepped and draped in standard clinic fashion. Selective debridement was performed to fully debride all devitalized and necrotic tissue down to bleeding granulation tissue with the use of a curette, forceps and scalpel. Topical 4% lidocaine was applied 10 minutes prior to the debridement and the patient tolerated this well. Hemostasis was achieved with pressure and the measurements did not change post-operatively.      Impression for medical decision making: 75 y.o. year old male with type 2 diabetes and PAD with history of fourth and fifth toe and partial metatarsal resection with worsening left great toe ulcer and new onset left heel ulcer.    Treatment Plan:    1.  Silvadene followed by alginate for both ulcers.  Daily dressings.    2.  General Ace wraps for edema management.  His prescription for Lasix he states is for 1 or 2 tablets whenever he is having increased swelling.  I have asked that he talk with his doctor for any increases in his Lasix.    3.  Culture taken.  Start doxycycline 100 mg p.o. twice daily for 10 days.    4.  PVLs ordered to reevaluate circulation.    5.  Continue with offloading shoe when ambulating.  He states that when he sleeps, he sleeps on his back and typically has his foot hanging off the edge of the bed to prevent any pressure in the heel area.  He does have a heel offloading boot so I will leave it up to him as toe positioning of sleep but a heel offloading boot would be helpful.    6.  Return to clinic in 10 days.  All return and ER precautions reviewed in detail today.  All questions answered at the time of the visit.    I personally spent 25 minutes face-to-face and non-face-to-face in the care of this patient, which includes all pre, intra, and post visit time on the date of service.

## 2020-07-31 DIAGNOSIS — E1129 Type 2 diabetes mellitus with other diabetic kidney complication: Principal | ICD-10-CM

## 2020-07-31 DIAGNOSIS — E1165 Type 2 diabetes mellitus with hyperglycemia: Principal | ICD-10-CM

## 2020-08-02 ENCOUNTER — Encounter: Payer: Medicare Other | Admitting: Dermatology

## 2020-08-08 ENCOUNTER — Ambulatory Visit: Admit: 2020-08-08 | Discharge: 2020-08-08 | Payer: MEDICARE

## 2020-08-08 DIAGNOSIS — E1151 Type 2 diabetes mellitus with diabetic peripheral angiopathy without gangrene: Principal | ICD-10-CM

## 2020-08-08 DIAGNOSIS — D849 Immunodeficiency, unspecified: Principal | ICD-10-CM

## 2020-08-08 DIAGNOSIS — L97521 Non-pressure chronic ulcer of other part of left foot limited to breakdown of skin: Principal | ICD-10-CM

## 2020-08-08 DIAGNOSIS — Z940 Kidney transplant status: Principal | ICD-10-CM

## 2020-08-08 DIAGNOSIS — L97421 Non-pressure chronic ulcer of left heel and midfoot limited to breakdown of skin: Principal | ICD-10-CM

## 2020-08-08 LAB — BASIC METABOLIC PANEL
ANION GAP: 8 mmol/L (ref 5–14)
BLOOD UREA NITROGEN: 27 mg/dL — ABNORMAL HIGH (ref 9–23)
BUN / CREAT RATIO: 20
CALCIUM: 8.9 mg/dL (ref 8.7–10.4)
CHLORIDE: 110 mmol/L — ABNORMAL HIGH (ref 98–107)
CO2: 23.3 mmol/L (ref 20.0–31.0)
CREATININE: 1.35 mg/dL — ABNORMAL HIGH
EGFR CKD-EPI AA MALE: 60 mL/min/{1.73_m2} (ref >=60)
EGFR CKD-EPI NON-AA MALE: 52 mL/min/{1.73_m2} — ABNORMAL LOW (ref >=60)
GLUCOSE RANDOM: 160 mg/dL — ABNORMAL HIGH (ref 70–99)
POTASSIUM: 4.9 mmol/L — ABNORMAL HIGH (ref 3.4–4.8)
SODIUM: 141 mmol/L (ref 135–145)

## 2020-08-08 LAB — PROTIME-INR
INR: 2.38
PROTIME: 27.6 s — ABNORMAL HIGH (ref 10.3–13.4)

## 2020-08-08 LAB — CBC W/ AUTO DIFF
BASOPHILS ABSOLUTE COUNT: 0 10*9/L (ref 0.0–0.1)
BASOPHILS RELATIVE PERCENT: 0.7 %
EOSINOPHILS ABSOLUTE COUNT: 0.1 10*9/L (ref 0.0–0.5)
EOSINOPHILS RELATIVE PERCENT: 1.3 %
HEMATOCRIT: 44.9 % (ref 39.0–48.0)
HEMOGLOBIN: 15.1 g/dL (ref 12.9–16.5)
LYMPHOCYTES ABSOLUTE COUNT: 1.2 10*9/L (ref 1.1–3.6)
LYMPHOCYTES RELATIVE PERCENT: 16.9 %
MEAN CORPUSCULAR HEMOGLOBIN CONC: 33.6 g/dL (ref 32.0–36.0)
MEAN CORPUSCULAR HEMOGLOBIN: 28.3 pg (ref 25.9–32.4)
MEAN CORPUSCULAR VOLUME: 84.3 fL (ref 77.6–95.7)
MEAN PLATELET VOLUME: 8 fL (ref 6.8–10.7)
MONOCYTES ABSOLUTE COUNT: 0.6 10*9/L (ref 0.3–0.8)
MONOCYTES RELATIVE PERCENT: 8.3 %
NEUTROPHILS ABSOLUTE COUNT: 5.2 10*9/L (ref 1.8–7.8)
NEUTROPHILS RELATIVE PERCENT: 72.8 %
NUCLEATED RED BLOOD CELLS: 0 /100{WBCs} (ref <=4)
PLATELET COUNT: 193 10*9/L (ref 150–450)
RED BLOOD CELL COUNT: 5.32 10*12/L (ref 4.26–5.60)
RED CELL DISTRIBUTION WIDTH: 14.3 % (ref 12.2–15.2)
WBC ADJUSTED: 7.2 10*9/L (ref 3.6–11.2)

## 2020-08-08 LAB — MAGNESIUM: MAGNESIUM: 1.8 mg/dL (ref 1.6–2.6)

## 2020-08-08 LAB — PHOSPHORUS: PHOSPHORUS: 3.9 mg/dL (ref 2.4–5.1)

## 2020-08-08 LAB — TACROLIMUS LEVEL: TACROLIMUS BLOOD: 4.5 ng/mL

## 2020-08-08 NOTE — Unmapped (Signed)
[facility] Return Visit Note        SUBJECTIVE:         History of Present Illness    David Ellis is a 75 y.o. 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 T2DM w/ neuropathy.  PAD. H/O L 4th/5th toe amputation for DFI/DFU. Immune-suppressed status.   ??? Was put on doxy by my partner last visit  ?? Wounds have almost healed  ?? Currently on SSD and advised offloading footwear and techniques  ??? Denies fever, chills, diaphoresis, malaise, nausea, or vomiting  ?? PRIOR VISIT(S)  ??? Heater burn   ??? Superficial wound, w/ good granulation tissue  ??? No purulence  ??? Has DM neuropathy  ??? Develop cellulitis and was started on cefdinir on 07/05/20 (end date 07/18/20)      Allergies, Medications, Past Medical, Surgical, Family, and Social History  I personally reviewed patient's allergies, meds, past medical, surgical, family, and social histories.    Review of Systems    Multi-point ROS was performed and was negative, unless mentioned above in the HPI.            OBJECTIVE:     Physical Exam  Vitals: There were no vitals taken for this visit.  General Apperance: comfortable and no acute distress  Respiratory: normal respiratory effort   Cardiovascular: audible pedal pulses bilaterally  Musculoskeletal: no clubbing, cyanosis, effusion  Skin: full thickness L 1st toe tip wound w/ good granulation tissue, 30% biofilm and no purulence or probing. Dry clean thin biofilm on the L 2nd toe tip, w/o purulence, fluctuance.   Neurologic: moves all extremities well and no involuntary movements  Psychiatric: normal mood, appropriate affect     Diagnostic Data  Labs / Tests:  recent labs/tests, if available, were personally reviewed  Imaging:  radiology studies, if available, were personally reviewed          ASSESSMENT / PLAN: 75 y.o. 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 T2DM w/ neuropathy.  PAD. H/O L 4th/5th toe amputation for DFI/DFU. Immune-suppressed status.   ?? Much improved - all wounds have nearly healed  ?? Routine wound cleansing  ?? Dressings: hydrator  ?? Offloading: offloading / therapeutic footwear  ?? Compression: none or light compression on LLE, compression stockings on RLE  ?? Debridement: N/I  ?? Bone/soft tissue imaging: N/I  ?? Vascular imaging: scheduled for repeat arterial PVLs on 08/12/20  ?? Abx: S/P doxy (07/28/20- 08/06/20)   ?? Red flags reviewed; ED & return precautions given    Time-based coding:  Total time spent caring for the patient on the day of the encounter was 30 minutes. That includes chart review before the visit, the actual patient visit, time spent on documentation, discussing patient's care w/ other health professionals or family members, corresponding with the patient, ordering medications, studies, procedures, or referrals after the visit. Total time does not include time spent on surgical procedure(s).

## 2020-08-08 NOTE — Unmapped (Signed)
-----   Message from Sharlee Blew, MD sent at 08/08/2020  9:23 AM EST -----  INR is ok.Reepeat one month.  NO change on coumadin dosing.    [name], M.D.     [facility] at [facility]   [address]  [address]  [place], [place]  [address]  [phone]

## 2020-08-08 NOTE — Unmapped (Addendum)
WOUND CARE INSTRUCTIONS:      Wash area with Antibacterial liquid soap with one wash cloth, clean the soap off with a clean cloth, and then pat dry with a dry cloth before the dressing change.    Dressing:   Left great toe: Apply a thin layer of silvadene cream to the wound bed and apply Alginate on top.  Secure with roll gauze and wrap with a light Ace Wrap from the base of the toes to the bend of the knee.    Remove ace wrap at night when you are in bed sleeping. Put back on in the morning.      Change dressing once a day.     Ensure that you are keeping pressure off of the wound    If you have any questions or concerns regarding your wound or wound care, please contact us at the [facility] clinic at [phone].       If your wound starts to develop the following , please call the [facility] for further advise:    ??  Increased drainage  ??  Redness around the wound  ??  Strong odor from the wound when changing the bandages  ??  Increased pain    Please do not hesitate to leave a voicemail on the nurse line. We make every effort to return your call the same day or the next day. Please leave a clear message with your name, date of birth,  and your medical record number. Leave a brief description of your problem.    If you are experiencing the following, please call us for advise or consider going to the nearest local Emergency Department or call 911.    ??  Fever of 100 F  ??  Nausea or Vomitting  ??  Pus draining from your wound  ??  Redness of the whole foot or leg  ??  Severe increase in pain above your baseline.        Harvin Hazel Sluss PA-C    [facility]  [phone]  08/08/20

## 2020-08-08 NOTE — Unmapped (Signed)
Patient verbalizes understanding of results.

## 2020-08-09 DIAGNOSIS — Z1159 Encounter for screening for other viral diseases: Principal | ICD-10-CM

## 2020-08-09 DIAGNOSIS — Z940 Kidney transplant status: Principal | ICD-10-CM

## 2020-08-09 DIAGNOSIS — Z79899 Other long term (current) drug therapy: Principal | ICD-10-CM

## 2020-08-09 DIAGNOSIS — D84821 Immunocompromised state due to drug therapy (CMS-HCC): Principal | ICD-10-CM

## 2020-08-09 DIAGNOSIS — D631 Anemia in chronic kidney disease: Principal | ICD-10-CM

## 2020-08-09 DIAGNOSIS — N189 Chronic kidney disease, unspecified: Principal | ICD-10-CM

## 2020-08-09 LAB — LIPID PANEL
CHOLESTEROL/HDL RATIO SCREEN: 3.7 (ref 1.0–4.5)
CHOLESTEROL: 128 mg/dL (ref <=200)
HDL CHOLESTEROL: 35 mg/dL — ABNORMAL LOW (ref 40–60)
LDL CHOLESTEROL CALCULATED: 67 mg/dL (ref 40–99)
NON-HDL CHOLESTEROL: 93 mg/dL (ref 70–130)
TRIGLYCERIDES: 131 mg/dL (ref 0–150)
VLDL CHOLESTEROL CAL: 26.2 mg/dL (ref 12–42)

## 2020-08-09 LAB — HEMOGLOBIN A1C
ESTIMATED AVERAGE GLUCOSE: 206 mg/dL
HEMOGLOBIN A1C: 8.8 % — ABNORMAL HIGH (ref 4.8–5.6)

## 2020-08-10 LAB — COVID SPIKE IGG
SARS-COV-2 IGG SEMI-QUANT: 686 [AU]/ml — ABNORMAL HIGH (ref <13.00)
SARS-COV-2 SPIKE IGG ANTIBODY: POSITIVE — AB

## 2020-08-12 ENCOUNTER — Ambulatory Visit: Admit: 2020-08-12 | Discharge: 2020-08-13 | Payer: MEDICARE

## 2020-08-12 DIAGNOSIS — E1151 Type 2 diabetes mellitus with diabetic peripheral angiopathy without gangrene: Principal | ICD-10-CM

## 2020-08-12 DIAGNOSIS — L97421 Non-pressure chronic ulcer of left heel and midfoot limited to breakdown of skin: Principal | ICD-10-CM

## 2020-08-12 DIAGNOSIS — L97521 Non-pressure chronic ulcer of other part of left foot limited to breakdown of skin: Principal | ICD-10-CM

## 2020-08-12 MED ORDER — WARFARIN 5 MG TABLET
ORAL_TABLET | Freq: Every day | ORAL | 3 refills | 90 days | Status: CP
Start: 2020-08-12 — End: ?

## 2020-08-12 NOTE — Unmapped (Signed)
Patient is requesting the following refill  Requested Prescriptions     Pending Prescriptions Disp Refills   ??? warfarin (JANTOVEN) 5 MG tablet [Pharmacy Med Name: WARFARIN SODIUM 5 MG TAB] 30 tablet 6     Sig: TAKE 5 DAYS A WEEK       Order pended. Please advise. Thanks    Last OV: 03/22/2020   Next OV: 09/20/2020

## 2020-08-15 NOTE — Unmapped (Signed)
[facility] Clinical Assessment & Refill Coordination Note    David Ellis, DOB: April 01, 1946  Phone: [phone] (home)     All above HIPAA information was verified with patient.     Was a Nurse, learning disability used for this call? No    Specialty Medication(s):   Transplant: Myfortic 180mg  and tacrolimus 1mg      Current Outpatient Medications   Medication Sig Dispense Refill   ??? aspirin (ECOTRIN) 81 MG tablet Take 81 mg by mouth daily as needed (when flying).     ??? blood-glucose meter kit Use as instructed. 1 each 0   ??? cefdinir (OMNICEF) 300 MG capsule Take 1 capsule (300 mg total) by mouth Two (2) times a day. 28 capsule 0   ??? cholecalciferol, vitamin D3, (VITAMIN D3) 2,000 unit Tab Take 2,000 Units by mouth daily.      ??? dulaglutide (TRULICITY) 1.5 mg/0.5 mL PnIj Inject 0.5 mL (1.5 mg total) under the skin every seven (7) days. 6 mL 3   ??? furosemide (LASIX) 40 MG tablet Take 1-2 tablets daily as needed for edema. 180 tablet 3   ??? gabapentin (NEURONTIN) 300 MG capsule Take 1 capsule (300 mg total) by mouth Two (2) times a day. 180 capsule 3   ??? HUMULIN R U-500, CONC, KWIKPEN 500 unit/mL (3 mL) CONCENTRATED injection DIAL TO MAX 100 UNITS BEFORE BREAKFAST, LUNCH AND DINNER. INJECT 30 MINUTES BEFORE MEALS. MAX OF 300 UNITS DAILY. 18 mL 11   ??? losartan (COZAAR) 25 MG tablet TAKE ONE TABLET BY MOUTH EVERY DAY 90 tablet 3   ??? magnesium chloride (SLOW_MAG) 64 mg TbEC Take 128 mg by mouth three (3) times a day (at 6am, noon and 6pm).      ??? metoprolol succinate (TOPROL-XL) 100 MG 24 hr tablet TAKE ONE TABLET TWICE DAILY 180 tablet 1   ??? miscellaneous medical supply Misc 1 application by Miscellaneous route daily as needed. Lightweight wheelchair. 1 each 0   ??? miscellaneous medical supply Misc 1 application by Miscellaneous route nightly. Recommend CPAP 14 cm H2O with EPR 3 and heated humidifier for nasal dryness, mask: ResMed Activa Lt nasal mask (Large). 1 each 0   ??? multivitamin capsule Take 1 capsule by mouth daily.     ??? MYFORTIC 180 mg EC tablet Take 3 tablets (540 mg total) by mouth two (2) times a day. 540 tablet 3   ??? pen needle, diabetic (BD ULTRA-FINE NANO PEN NEEDLE) 32 gauge x 5/32 (4 mm) Ndle 1 each by Other route Three (3) times a day before meals. 300 each 2   ??? pravastatin (PRAVACHOL) 20 MG tablet TAKE ONE TABLET BY MOUTH EVERY DAY 90 tablet 3   ??? silver sulfaDIAZINE (SILVADENE) 1 % cream Apply topically daily. 50 g 1   ??? tacrolimus (PROGRAF) 1 MG capsule Take 3 capsules (3 mg total) by mouth in the morning AND 2 capsules (2 mg total) in the evening. 450 capsule 3   ??? warfarin (COUMADIN) 2.5 MG tablet Take 1 tablet (2.5 mg total) by mouth daily. (Patient taking differently: Take 2.5 mg by mouth once a week. On Mondays 2.5mg  PO) 30 tablet 11   ??? warfarin (JANTOVEN) 5 MG tablet Take 1 tablet (5 mg total) by mouth daily with evening meal. TAKE 6 DAYS A WEEK - Tuesday through Sunday 90 tablet 3     No current facility-administered medications for this visit.     Facility-Administered Medications Ordered in Other Visits  Medication Dose Route Frequency Provider Last Rate Last Admin   ??? bevacizumab    PRN (once a day) Melvyn Novas, MD   2.5 mg at 01/28/17 0750        Changes to medications: Edwyn reports no changes at this time.    Allergies   Allergen Reactions   ??? Levofloxacin Other (See Comments)     Other reaction(s): Unknown  Unknown   ??? Lidocaine Other (See Comments)     I don't remember States is fine with Bupivicaine   ??? Penicillins      As child  Other reaction(s): UNKNOWN.   (UPDATE: Tolerated amp/sulbactam without side effects during 10/2014 hospitalization)   ??? Egg Derived Rash   ??? Enalapril Cough   ??? Epinephrine Palpitations   ??? Grass Pollen-[place], Standard Itching   ??? Lisinopril Rash   ??? Mepivacaine Hcl Palpitations       Changes to allergies: No    SPECIALTY MEDICATION ADHERENCE     Myfortic 180mg   : 6 days of medicine on hand   Tacrolimus 1mg   : 6 days of medicine on hand       Medication Adherence    Patient reported X missed doses in the last month: 0  Specialty Medication: myfortic 180mg   Patient is on additional specialty medications: Yes  Additional Specialty Medications: Tacrolimus 1mg   Patient Reported Additional Medication X Missed Doses in the Last Month: 0  Adherence tools used: patient uses a pill box to manage medications  Support network for adherence: family member          Specialty medication(s) dose(s) confirmed: Regimen is correct and unchanged.     Are there any concerns with adherence? No    Adherence counseling provided? Not needed    CLINICAL MANAGEMENT AND INTERVENTION      Clinical Benefit Assessment:    Do you feel the medicine is effective or helping your condition? Yes    Clinical Benefit counseling provided? Not needed    Adverse Effects Assessment:    Are you experiencing any side effects? No    Are you experiencing difficulty administering your medicine? No    Quality of Life Assessment:    How many days over the past month did your transplant  keep you from your normal activities? For example, brushing your teeth or getting up in the morning. 0    Have you discussed this with your provider? Not needed    Therapy Appropriateness:    Is therapy appropriate? Yes, therapy is appropriate and should be continued    DISEASE/MEDICATION-SPECIFIC INFORMATION      N/A    PATIENT SPECIFIC NEEDS     - Does the patient have any physical, cognitive, or cultural barriers? No    - Is the patient high risk? Yes, patient is taking a REMS drug. Medication is dispensed in compliance with REMS program    - Does the patient require a Care Management Plan? No     - Does the patient require physician intervention or other additional services (i.e. nutrition, smoking cessation, social work)? No      SHIPPING     Specialty Medication(s) to be Shipped:   Transplant: Myfortic 180mg  and tacrolimus 1mg     Other medication(s) to be shipped: No additional medications requested for fill at this time     Changes to insurance: No    Delivery Scheduled: Yes, Expected medication delivery date: 08/17/2020.     Medication will be delivered via Next Day  Courier to the confirmed prescription address in [place].    The patient will receive a drug information handout for each medication shipped and additional FDA Medication Guides as required.  Verified that patient has previously received a Conservation officer, historic buildings.    All of the patient's questions and concerns have been addressed.    [name]   [facility] Specialty Pharmacist

## 2020-08-17 DIAGNOSIS — D849 Immunodeficiency, unspecified: Principal | ICD-10-CM

## 2020-08-17 DIAGNOSIS — Z125 Encounter for screening for malignant neoplasm of prostate: Principal | ICD-10-CM

## 2020-08-18 MED FILL — MYFORTIC 180 MG TABLET,DELAYED RELEASE: ORAL | 30 days supply | Qty: 180 | Fill #8

## 2020-08-18 MED FILL — TACROLIMUS 1 MG CAPSULE, IMMEDIATE-RELEASE: ORAL | 30 days supply | Qty: 150 | Fill #3

## 2020-08-18 NOTE — Unmapped (Signed)
Addended by: [name] on: 08/17/2020 09:34 PM     Modules accepted: Orders

## 2020-08-26 ENCOUNTER — Ambulatory Visit: Admit: 2020-08-26 | Discharge: 2020-08-26 | Payer: MEDICARE

## 2020-08-26 ENCOUNTER — Ambulatory Visit: Admit: 2020-08-26 | Discharge: 2020-08-26 | Payer: MEDICARE | Attending: Nephrology | Primary: Nephrology

## 2020-08-26 DIAGNOSIS — Z1159 Encounter for screening for other viral diseases: Principal | ICD-10-CM

## 2020-08-26 DIAGNOSIS — Z79899 Other long term (current) drug therapy: Principal | ICD-10-CM

## 2020-08-26 DIAGNOSIS — Z125 Encounter for screening for malignant neoplasm of prostate: Principal | ICD-10-CM

## 2020-08-26 DIAGNOSIS — Z940 Kidney transplant status: Principal | ICD-10-CM

## 2020-08-26 DIAGNOSIS — D631 Anemia in chronic kidney disease: Principal | ICD-10-CM

## 2020-08-26 DIAGNOSIS — N189 Chronic kidney disease, unspecified: Principal | ICD-10-CM

## 2020-08-26 DIAGNOSIS — D849 Immunodeficiency, unspecified: Principal | ICD-10-CM

## 2020-08-26 DIAGNOSIS — D84821 Immunocompromised state due to drug therapy (CMS-HCC): Principal | ICD-10-CM

## 2020-08-26 DIAGNOSIS — I2699 Other pulmonary embolism without acute cor pulmonale: Principal | ICD-10-CM

## 2020-08-26 LAB — URINALYSIS
BILIRUBIN UA: NEGATIVE
BLOOD UA: NEGATIVE
GLUCOSE UA: NEGATIVE
KETONES UA: NEGATIVE
LEUKOCYTE ESTERASE UA: NEGATIVE
NITRITE UA: NEGATIVE
PH UA: 5.5 (ref 5.0–9.0)
PROTEIN UA: 30 — AB
RBC UA: <1 /HPF (ref <3)
SPECIFIC GRAVITY UA: >=1.03 (ref 1.005–1.030)
SQUAMOUS EPITHELIAL: 1 /HPF (ref 0–5)
UROBILINOGEN UA: 0.2
WBC UA: <1 /HPF (ref <2)

## 2020-08-26 LAB — CBC W/ AUTO DIFF
BASOPHILS ABSOLUTE COUNT: 0 10*9/L (ref 0.0–0.1)
BASOPHILS RELATIVE PERCENT: 0.6 %
EOSINOPHILS ABSOLUTE COUNT: 0.1 10*9/L (ref 0.0–0.5)
EOSINOPHILS RELATIVE PERCENT: 0.8 %
HEMATOCRIT: 45.8 % (ref 39.0–48.0)
HEMOGLOBIN: 15.4 g/dL (ref 12.9–16.5)
LYMPHOCYTES ABSOLUTE COUNT: 1.2 10*9/L (ref 1.1–3.6)
LYMPHOCYTES RELATIVE PERCENT: 15.7 %
MEAN CORPUSCULAR HEMOGLOBIN CONC: 33.6 g/dL (ref 32.0–36.0)
MEAN CORPUSCULAR HEMOGLOBIN: 28.1 pg (ref 25.9–32.4)
MEAN CORPUSCULAR VOLUME: 83.7 fL (ref 77.6–95.7)
MEAN PLATELET VOLUME: 7.9 fL (ref 6.8–10.7)
MONOCYTES ABSOLUTE COUNT: 0.5 10*9/L (ref 0.3–0.8)
MONOCYTES RELATIVE PERCENT: 6.7 %
NEUTROPHILS ABSOLUTE COUNT: 5.7 10*9/L (ref 1.8–7.8)
NEUTROPHILS RELATIVE PERCENT: 76.2 %
NUCLEATED RED BLOOD CELLS: 0 /100{WBCs} (ref <=4)
PLATELET COUNT: 192 10*9/L (ref 150–450)
RED BLOOD CELL COUNT: 5.48 10*12/L (ref 4.26–5.60)
RED CELL DISTRIBUTION WIDTH: 14.1 % (ref 12.2–15.2)
WBC ADJUSTED: 7.5 10*9/L (ref 3.6–11.2)

## 2020-08-26 LAB — COMPREHENSIVE METABOLIC PANEL
ALBUMIN: 3.5 g/dL (ref 3.4–5.0)
ALKALINE PHOSPHATASE: 88 U/L (ref 46–116)
ALT (SGPT): 20 U/L (ref 10–49)
ANION GAP: 7 mmol/L (ref 5–14)
AST (SGOT): 27 U/L (ref <=34)
BILIRUBIN TOTAL: 0.8 mg/dL (ref 0.3–1.2)
BLOOD UREA NITROGEN: 29 mg/dL — ABNORMAL HIGH (ref 9–23)
BUN / CREAT RATIO: 23
CALCIUM: 9.3 mg/dL (ref 8.7–10.4)
CHLORIDE: 105 mmol/L (ref 98–107)
CO2: 26.7 mmol/L (ref 20.0–31.0)
CREATININE: 1.27 mg/dL — ABNORMAL HIGH
EGFR CKD-EPI AA MALE: 64 mL/min/{1.73_m2} (ref >=60)
EGFR CKD-EPI NON-AA MALE: 56 mL/min/{1.73_m2} — ABNORMAL LOW (ref >=60)
GLUCOSE RANDOM: 200 mg/dL — ABNORMAL HIGH (ref 70–99)
POTASSIUM: 4.5 mmol/L (ref 3.4–4.8)
PROTEIN TOTAL: 6.9 g/dL (ref 5.7–8.2)
SODIUM: 139 mmol/L (ref 135–145)

## 2020-08-26 LAB — TACROLIMUS LEVEL, TROUGH: TACROLIMUS, TROUGH: 5.3 ng/mL (ref 5.0–15.0)

## 2020-08-26 LAB — PROTEIN / CREATININE RATIO, URINE
CREATININE, URINE: 133.3 mg/dL
PROTEIN URINE: 52.9 mg/dL
PROTEIN/CREAT RATIO, URINE: 0.397

## 2020-08-26 LAB — MAGNESIUM: MAGNESIUM: 2 mg/dL (ref 1.6–2.6)

## 2020-08-26 LAB — PROTIME-INR
INR: 1.72
PROTIME: 20 s — ABNORMAL HIGH (ref 10.3–13.4)

## 2020-08-26 LAB — PSA: PROSTATE SPECIFIC ANTIGEN: 0.82 ng/mL (ref 0.00–4.00)

## 2020-08-26 LAB — PHOSPHORUS: PHOSPHORUS: 3.5 mg/dL (ref 2.4–5.1)

## 2020-08-26 MED ADMIN — tixagevimab-cilgavimab 150 mg/1.5 mL- 150 mg/1.5 mL injection 3 mL: 3 mL | INTRAMUSCULAR | @ 14:00:00 | Stop: 2020-08-26

## 2020-08-26 NOTE — Unmapped (Signed)
Pt here for Evusheld make-up dose injections. Education and handout provided to pt and questions answered. Signs and symptoms gone over with patient and they verbalized understanding. First injection given to right side gluteal muscle and second given to left side. Pt tolerated without difficulty. Pt in lobby for one hour observation.

## 2020-08-26 NOTE — Unmapped (Signed)
Transplant Nephrology Telephone Visit    Assessment and Plan    David Ellis is a 75 y.o. 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 He is seen today for his annual evaluation in the transplant clinic. He continues to follow closely with Dr. Arvin Collard as well. 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   - Serum creatinine is 1.27 mg/dL (baseline 1.6-1.0 mg/dL).   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   - He has a remote history of a HLA-DQ 7 donor specific antibody (noted on 09/17/2011) with all subsequent HLA antibody screens revealing no DSA's (most recent 08/21/19  - UP/C today is 0.397 consistent with findings over the past 2 years.   - Diabetic nephropathy is the most likely cause of the sub-nephrotic proteinuria.   - Will recheck DSA screen today with no current plans to pursue a biopsy.      Immunosuppression management.    - He is tolerating his current immunosuppressive regimen well.   - Tacrolimus target level is 5.3 (target 4-7 today).  - Will continue mycophenolate sodium 540 mg twice daily and tacrolimus at 3 mg AM/2 mg PM.     Hypertension, controlled.   - BP today 123/63 (target 130/80).  - Metoprolol succinate 100 mg bid and furosemide 40 to 80 mg daily will be continued.      Immunizations/Infection Prevention.   - Pneumococcal vaccines 07/13/16 Prevnar-13 08/26/13  - Flu vaccine 03/15/20  - Shingrix x 2 completed  - COVID-19 vaccine x 3 (dose #3 01/27/20).  - Evusheld 2nd dose of 150/150 given today.  - Plan booster COVID-19 vaccine 2 weeks after Evusheld. He is aware of the limited efficacy of COVID vaccination in immunosuppressed patients.     History of  DVT and pulmonary embolus.   - He will continue Coumadin.      Diabetes mellitus, type 2.    - Hemoglobin A1c 8.8 on 08/08/20.   - Treatment includes dulaglutide 1.5 mg weekly and humulin with CGM  - He will follow in Endocrinology clinic and with Dr. Arvin Collard.     Atrial fibrillation, S/P ablation.  - Denies palpitations, exam with regular rhythm today  - He will continue Coumadin and metoprolol XL and avoidance of caffeine.     CVA, status post alteplase 07/2015.   - Denies new neurologic symptoms or residual deficits.  - Continue coumadin and statin.    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  - A recently noted wound on his foot is now almost healed.     Post-transplant hyperparathyroidism, CKD-MBD.    - Serum Ca 9.3, phos 3.5,  PTH 115.  - He does not meet criteria for initiation of Sensipar.    Cancer screening.   - He sees Dermatology at least every 6 months  - Renal US today (08/26/20) reveals no renal masses.   - He is doing intermittent cologaurd testing rather than a colonoscopy due to being on coumadin (last done in 2019 per patient).     - PSA today 0.82    Obesity  - BMI 34.07 kg/m2 (34.36 kg/m2 last year).  - Tolerating Trulicity well    Follow-up.    - He will continue to follow with his multiple physicians.  He will be seen in transplant clinic in 1 year.     History of Present Illness    Transplant  History:  David Ellis is 75 y.o. 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  He has no history of rejection with baseline serum creatinine 1.2-1.7 mg/dL. 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    His posttransplant course was complicated by a pulmonary embolus in December 2014, foot infections necessitating amputation of 2 toes, and a stroke in February 2017.    Interval history:     He presents today for his annual visit. He denies dysuria, allograft tenderness, fever or chills. Peripheral edema is stable and moderate. He continues to have unsteady gait related to balance problems due to neuropathy. He has a healing foot ulcer and is being followed in [facility].He states his wounds have now healed. Blood pressure has been <130/80. Blood glucose control has been suboptimal with wide swings in blood glucose.         He denies chest pain, shortness of breath, nausea, vomiting, or diarrhea. He denies new neurologic deficits. He denies COVID-19 infection or exposures. He is scheduled to receive a second dose of Evusheld today based on updated FDA dosing guidelines.     Review of Systems    All other systems are reviewed and are negative. A 10 systems review is completed.    Medications    Current Outpatient Medications   Medication Sig Dispense Refill   ??? aspirin (ECOTRIN) 81 MG tablet Take 81 mg by mouth daily as needed (when flying).     ??? cefdinir (OMNICEF) 300 MG capsule Take 1 capsule (300 mg total) by mouth Two (2) times a day. 28 capsule 0   ??? cholecalciferol, vitamin D3, (VITAMIN D3) 2,000 unit Tab Take 2,000 Units by mouth daily.      ??? dulaglutide (TRULICITY) 1.5 mg/0.5 mL PnIj Inject 0.5 mL (1.5 mg total) under the skin every seven (7) days. 6 mL 3   ??? furosemide (LASIX) 40 MG tablet Take 1-2 tablets daily as needed for edema. 180 tablet 3   ??? gabapentin (NEURONTIN) 300 MG capsule Take 1 capsule (300 mg total) by mouth Two (2) times a day. 180 capsule 3   ??? HUMULIN R U-500, CONC, KWIKPEN 500 unit/mL (3 mL) CONCENTRATED injection DIAL TO MAX 100 UNITS BEFORE BREAKFAST, LUNCH AND DINNER. INJECT 30 MINUTES BEFORE MEALS. MAX OF 300 UNITS DAILY. 18 mL 11   ??? losartan (COZAAR) 25 MG tablet TAKE ONE TABLET BY MOUTH EVERY DAY 90 tablet 3   ??? magnesium chloride (SLOW_MAG) 64 mg TbEC Take 128 mg by mouth three (3) times a day (at 6am, noon and 6pm).      ??? metoprolol succinate (TOPROL-XL) 100 MG 24 hr tablet TAKE ONE TABLET TWICE DAILY 180 tablet 1   ??? miscellaneous medical supply Misc 1 application by Miscellaneous route daily as needed. Lightweight wheelchair. 1 each 0   ??? miscellaneous medical supply Misc 1 application by Miscellaneous route nightly. Recommend CPAP 14 cm H2O with EPR 3 and heated humidifier for nasal dryness, mask: ResMed Activa Lt nasal mask (Large). 1 each 0   ??? multivitamin capsule Take 1 capsule by mouth daily.     ??? MYFORTIC 180 mg EC tablet Take 3 tablets (540 mg total) by mouth two (2) times a day. 540 tablet 3   ??? pen needle, diabetic (BD ULTRA-FINE NANO PEN NEEDLE) 32 gauge x 5/32 (4 mm) Ndle 1 each  by Other route Three (3) times a day before meals. 300 each 2   ??? pravastatin (PRAVACHOL) 20 MG tablet TAKE ONE TABLET BY MOUTH EVERY DAY 90 tablet 3   ??? silver sulfaDIAZINE (SILVADENE) 1 % cream Apply topically daily. 50 g 1   ??? tacrolimus (PROGRAF) 1 MG capsule Take 3 capsules (3 mg total) by mouth in the morning AND 2 capsules (2 mg total) in the evening. 450 capsule 3   ??? warfarin (COUMADIN) 2.5 MG tablet Take 1 tablet (2.5 mg total) by mouth daily. (Patient taking differently: Take 2.5 mg by mouth once a week. On Mondays 2.5mg  PO) 30 tablet 11   ??? warfarin (JANTOVEN) 5 MG tablet Take 1 tablet (5 mg total) by mouth daily with evening meal. TAKE 6 DAYS A WEEK - Tuesday through Sunday 90 tablet 3   ??? blood-glucose meter kit Use as instructed. 1 each 0     No current facility-administered medications for this visit.     Facility-Administered Medications Ordered in Other Visits   Medication Dose Route Frequency Provider Last Rate Last Admin   ??? bevacizumab    PRN (once a day) Melvyn Novas, MD   2.5 mg at 01/28/17 0750       Physical Exam    BP 123/63 (BP Site: L Arm, BP Position: Sitting, BP Cuff Size: Large)  - Pulse 75  - Ht 185.4 cm (6' 1)  - Wt (!) 117.1 kg (258 lb 3.2 oz)  - SpO2 96%  - BMI 34.07 kg/m??   General: Patient is a pleasant male in no apparent distress.  Eyes: Sclera anicteric.  Neck: Supple without LAD/JVD/bruits.  Lungs: Clear to auscultation bilaterally, no wheezes/rales/rhonchi.  Cardiovascular: Regular rate and rhythm without murmurs, rubs or gallops.  Abdomen: Soft, notender/nondistended. Positive bowel sounds. No hepatosplenomegaly, masses or bruits appreciated.  Extremities: 1+ LE edema  Skin: Without rash  Neurological: gait unsteady, distal sensory loss in LEs.  Psychiatric: Mood and affect appropriate.    Laboratory Results    Recent Results (from the past 170 hour(s))   Urinalysis    Collection Time: 08/26/20  9:25 AM   Result Value Ref Range    Color, UA Yellow     Clarity, UA Clear     Specific Gravity, UA >=1.030 1.005 - 1.030    pH, UA 5.5 5.0 - 9.0    Leukocyte Esterase, UA Negative Negative    Nitrite, UA Negative Negative    Protein, UA 30 mg/dL (A) Negative    Glucose, UA Negative Negative    Ketones, UA Negative Negative    Urobilinogen, UA 0.2 mg/dL 0.2 - 2.0 mg/dL    Bilirubin, UA Negative Negative    Blood, UA Negative Negative    RBC, UA <1 <3 /HPF    WBC, UA <1 <2 /HPF    Squam Epithel, UA 1 0 - 5 /HPF    Bacteria, UA Moderate (A) None Seen /HPF   CBC w/ Differential    Collection Time: 08/26/20  9:25 AM   Result Value Ref Range    WBC 7.5 3.6 - 11.2 10*9/L    RBC 5.48 4.26 - 5.60 10*12/L    HGB 15.4 12.9 - 16.5 g/dL    HCT 01.0 27.2 - 53.6 %    MCV 83.7 77.6 - 95.7 fL    MCH 28.1 25.9 - 32.4 pg    MCHC 33.6 32.0 - 36.0 g/dL    RDW 64.4 03.4 - 74.2 %  MPV 7.9 6.8 - 10.7 fL    Platelet 192 150 - 450 10*9/L    nRBC 0 <=4 /100 WBCs    Neutrophils % 76.2 %    Lymphocytes % 15.7 %    Monocytes % 6.7 %    Eosinophils % 0.8 %    Basophils % 0.6 %    Absolute Neutrophils 5.7 1.8 - 7.8 10*9/L    Absolute Lymphocytes 1.2 1.1 - 3.6 10*9/L    Absolute Monocytes 0.5 0.3 - 0.8 10*9/L    Absolute Eosinophils 0.1 0.0 - 0.5 10*9/L    Absolute Basophils 0.0 0.0 - 0.1 10*9/L

## 2020-08-27 LAB — CMV DNA, QUANTITATIVE, PCR: CMV VIRAL LD: NOT DETECTED

## 2020-08-29 ENCOUNTER — Ambulatory Visit: Admit: 2020-08-29 | Discharge: 2020-08-30 | Payer: MEDICARE

## 2020-08-29 DIAGNOSIS — L97521 Non-pressure chronic ulcer of other part of left foot limited to breakdown of skin: Principal | ICD-10-CM

## 2020-08-29 DIAGNOSIS — D849 Immunodeficiency, unspecified: Principal | ICD-10-CM

## 2020-08-29 DIAGNOSIS — E1151 Type 2 diabetes mellitus with diabetic peripheral angiopathy without gangrene: Principal | ICD-10-CM

## 2020-08-29 DIAGNOSIS — Z940 Kidney transplant status: Principal | ICD-10-CM

## 2020-08-29 DIAGNOSIS — L97421 Non-pressure chronic ulcer of left heel and midfoot limited to breakdown of skin: Principal | ICD-10-CM

## 2020-08-29 LAB — VITAMIN D 25 HYDROXY: VITAMIN D, TOTAL (25OH): 32.7 ng/mL (ref 20.0–80.0)

## 2020-08-29 NOTE — Unmapped (Signed)
[facility] Return Visit Note        SUBJECTIVE:         History of Present Illness    KEYNAN HEFFERN is a 75 y.o. 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 T2DM w/ neuropathy.  PAD. H/O L 4th/5th toe amputation for DFI/DFU. Immune-suppressed status.   ??? All wounds have healed  ?? Previously on SSD and currently using offloading footwear   ??? 08/12/20 arterial PVLs showed outflow disease on the RIGHT and bilateral runnoff disease.  R toe pressure 86, L 2nd toe 100  ??? Denies claudication   ??? Denies fever, chills, diaphoresis, malaise, nausea, or vomiting      Allergies, Medications, Past Medical, Surgical, Family, and Social History  I personally reviewed patient's allergies, meds, past medical, surgical, family, and social histories.    Review of Systems    Multi-point ROS was performed and was negative, unless mentioned above in the HPI.            OBJECTIVE:     Physical Exam  Vitals: BP 122/67  - Pulse 71  - Temp 36.7 ??C (98 ??F) (Temporal)   General Apperance: comfortable and no acute distress  Respiratory: normal respiratory effort   Cardiovascular: audible pedal pulses bilaterally  Musculoskeletal: no clubbing, cyanosis, effusion  Skin: full thickness L 1st toe tip wound w/ good granulation tissue, 30% biofilm and no purulence or probing. Dry clean thin biofilm on the L 2nd toe tip, w/o purulence, fluctuance.   Neurologic: moves all extremities well and no involuntary movements  Psychiatric: normal mood, appropriate affect     Diagnostic Data  Labs / Tests:  recent labs/tests, if available, were personally reviewed  Imaging:  radiology studies, if available, were personally reviewed          ASSESSMENT / PLAN:      75 y.o. male, with h/o renal transplant (on tacrolimus), DM II, HTN, PAD, left 4th/5th toe amputation and A fib (on warfarin), S/P kidney transplant, on immunosuppressive therapy, presenting with:    ##  Healed DFUs of L foot (1st/2nd toes, heel). T2DM w/ neuropathy.  PAD. H/O L 4th/5th toe amputation for DFI/DFU. Immune-suppressed status.   ?? All wounds have healed   ?? Routine routine cleansing of the feet/legs  ?? Dressings: emollients to dry areas  ?? Offloading: offloading / therapeutic footwear  ?? Compression: none or light compression on LLE, compression stockings on RLE  ?? Debridement: N/I  ?? Bone/soft tissue imaging: N/I  ?? Vascular imaging: 08/12/20 arterial PVLs showed outflow disease on the RIGHT and bilateral runnoff disease.  R toe pressure 86, L 2nd toe 100 -- F/U in 6 wks with Dr. Jacolyn Reedy  ?? Abx: S/P doxy (07/28/20- 08/06/20)   ?? Red flags reviewed; ED & return precautions given    RTC in 6 wks w/ Dr. Jacolyn Reedy for PAD    Time-based coding:  Total time spent caring for the patient on the day of the encounter was 30 minutes. That includes chart review before the visit, the actual patient visit, time spent on documentation, discussing patient's care w/ other health professionals or family members, corresponding with the patient, ordering medications, studies, procedures, or referrals after the visit. Total time does not  include time spent on surgical procedure(s).

## 2020-08-29 NOTE — Unmapped (Signed)
Start wearing your regular shoe for a couple of hours per day and slowly transistion to this fully over the next couple of weeks.    Continuie regular diabetic foot care.    Start slowly with using a recumbent bike.    We will see you back in about 6 weeks with Dr. Jacolyn Reedy on a Wednesday to discuss your arterial ultrasound. I have no acute concerns.     If your wound starts to develop the following , please call the [facility] for further advise:    ??  Increased drainage  ??  Redness around the wound  ??  Strong odor from the wound when changing the bandages  ??  Increased pain    Please do not hesitate to leave a voicemail on the nurse line. We make every effort to return your call the same day or the next day. Please leave a clear message with your name, date of birth,  and your medical record number. Leave a brief description of your problem.    If you are experiencing the following, please call us for advise or consider going to the nearest local Emergency Department or call 911.    ??  Fever of 100 F  ??  Nausea or Vomitting  ??  Pus draining from your wound  ??  Redness of the whole foot or leg  ??  Severe increase in pain above your baseline.    [facility]  [phone]      Harvin Hazel Talyn Dessert PA-C    08/29/2020

## 2020-08-30 NOTE — Unmapped (Signed)
TRF UNOS form

## 2020-09-01 LAB — FSAB CLASS 2 ANTIBODY SPECIFICITY: HLA CL2 AB RESULT: NEGATIVE

## 2020-09-01 LAB — HLA DS POST TRANSPLANT
ANTI-DONOR DRW #2 MFI: 0 MFI
ANTI-DONOR HLA-A #1 MFI: 0 MFI
ANTI-DONOR HLA-B #1 MFI: 31 MFI
ANTI-DONOR HLA-B #2 MFI: 0 MFI
ANTI-DONOR HLA-C #1 MFI: 0 MFI
ANTI-DONOR HLA-C #2 MFI: 0 MFI
ANTI-DONOR HLA-DQB #1 MFI: 0 MFI
ANTI-DONOR HLA-DQB #2 MFI: 102 MFI
ANTI-DONOR HLA-DR #1 MFI: 0 MFI
ANTI-DONOR HLA-DR #2 MFI: 3 MFI

## 2020-09-01 LAB — FSAB CLASS 1 ANTIBODY SPECIFICITY: HLA CLASS 1 ANTIBODY RESULT: NEGATIVE

## 2020-09-02 LAB — VITAMIN D 1,25 DIHYDROXY: VITAMIN D 1,25-DIHYDROXY: 23 pg/mL

## 2020-09-06 NOTE — Unmapped (Signed)
[facility] Refill Coordination Note    Specialty Medication(s) to be Shipped:   Transplant: Myfortic 180mg  and tacrolimus 1mg     Other medication(s) to be shipped: No additional medications requested for fill at this time     David Ellis, DOB: Oct 15, 1945  Phone: [phone] (home)       All above HIPAA information was verified with patient.     Was a Nurse, learning disability used for this call? No    Completed refill call assessment today to schedule patient's medication shipment from the [facility] [phone]).       Specialty medication(s) and dose(s) confirmed: Regimen is correct and unchanged.   Changes to medications: David Ellis reports no changes at this time.  Changes to insurance: No  Questions for the pharmacist: No    Confirmed patient received a Conservation officer, historic buildings and a Surveyor, mining with first shipment. The patient will receive a drug information handout for each medication shipped and additional FDA Medication Guides as required.       DISEASE/MEDICATION-SPECIFIC INFORMATION        N/A    SPECIALTY MEDICATION ADHERENCE     Medication Adherence    Patient reported X missed doses in the last month: 0  Specialty Medication: Myfortic 180mg   Patient is on additional specialty medications: Yes  Additional Specialty Medications: Tacrolimus 1mg   Patient Reported Additional Medication X Missed Doses in the Last Month: 0  Patient is on more than two specialty medications: No  Informant: patient  Adherence tools used: patient uses a pill box to manage medications  Support network for adherence: family member                Myfortic 180 mg: 12 days of medicine on hand   Tacrolimus 1 mg: 12 days of medicine on hand         SHIPPING     Shipping address confirmed in Epic.     Delivery Scheduled: Yes, Expected medication delivery date: 09/14/20.     Medication will be delivered via Next Day Courier to the prescription address in Epic WAM.    David Ellis   [facility] Specialty Technician

## 2020-09-09 DIAGNOSIS — E1129 Type 2 diabetes mellitus with other diabetic kidney complication: Principal | ICD-10-CM

## 2020-09-09 DIAGNOSIS — E1165 Type 2 diabetes mellitus with hyperglycemia: Principal | ICD-10-CM

## 2020-09-09 MED ORDER — TRULICITY 1.5 MG/0.5 ML SUBCUTANEOUS PEN INJECTOR
0 refills | 0 days | Status: CP
Start: 2020-09-09 — End: ?

## 2020-09-13 MED FILL — MYFORTIC 180 MG TABLET,DELAYED RELEASE: ORAL | 30 days supply | Qty: 180 | Fill #9

## 2020-09-13 MED FILL — TACROLIMUS 1 MG CAPSULE, IMMEDIATE-RELEASE: ORAL | 30 days supply | Qty: 150 | Fill #4

## 2020-09-15 MED ORDER — PEN NEEDLE, DIABETIC 32 GAUGE X 5/32" (4 MM)
Freq: Three times a day (TID) | 3 refills | 100.00000 days | Status: CP
Start: 2020-09-15 — End: ?

## 2020-09-20 ENCOUNTER — Ambulatory Visit: Admit: 2020-09-20 | Discharge: 2020-09-21 | Payer: MEDICARE

## 2020-09-20 DIAGNOSIS — E1129 Type 2 diabetes mellitus with other diabetic kidney complication: Principal | ICD-10-CM

## 2020-09-20 DIAGNOSIS — I10 Essential (primary) hypertension: Principal | ICD-10-CM

## 2020-09-20 DIAGNOSIS — E1165 Type 2 diabetes mellitus with hyperglycemia: Principal | ICD-10-CM

## 2020-09-20 DIAGNOSIS — I4891 Unspecified atrial fibrillation: Principal | ICD-10-CM

## 2020-09-20 DIAGNOSIS — Z940 Kidney transplant status: Principal | ICD-10-CM

## 2020-09-20 DIAGNOSIS — I2699 Other pulmonary embolism without acute cor pulmonale: Principal | ICD-10-CM

## 2020-09-20 DIAGNOSIS — R799 Abnormal finding of blood chemistry, unspecified: Principal | ICD-10-CM

## 2020-09-20 DIAGNOSIS — Z125 Encounter for screening for malignant neoplasm of prostate: Principal | ICD-10-CM

## 2020-09-20 LAB — COMPREHENSIVE METABOLIC PANEL
ALBUMIN: 3.6 g/dL (ref 3.4–5.0)
ALKALINE PHOSPHATASE: 101 U/L (ref 46–116)
ALT (SGPT): 25 U/L (ref 10–49)
ANION GAP: 5 mmol/L (ref 5–14)
AST (SGOT): 26 U/L (ref <=34)
BILIRUBIN TOTAL: 1 mg/dL (ref 0.3–1.2)
BLOOD UREA NITROGEN: 17 mg/dL (ref 9–23)
BUN / CREAT RATIO: 15
CALCIUM: 9.4 mg/dL (ref 8.7–10.4)
CHLORIDE: 100 mmol/L (ref 98–107)
CO2: 29 mmol/L (ref 20.0–31.0)
CREATININE: 1.16 mg/dL — ABNORMAL HIGH
EGFR CKD-EPI AA MALE: 72 mL/min/{1.73_m2} (ref >=60)
EGFR CKD-EPI NON-AA MALE: 62 mL/min/{1.73_m2} (ref >=60)
GLUCOSE RANDOM: 314 mg/dL — ABNORMAL HIGH (ref 70–179)
POTASSIUM: 5.4 mmol/L — ABNORMAL HIGH (ref 3.4–4.8)
PROTEIN TOTAL: 6.9 g/dL (ref 5.7–8.2)
SODIUM: 134 mmol/L — ABNORMAL LOW (ref 135–145)

## 2020-09-20 LAB — CBC W/ AUTO DIFF
BASOPHILS ABSOLUTE COUNT: 0 10*9/L (ref 0.0–0.1)
BASOPHILS RELATIVE PERCENT: 0.4 %
EOSINOPHILS ABSOLUTE COUNT: 0.1 10*9/L (ref 0.0–0.5)
EOSINOPHILS RELATIVE PERCENT: 1.1 %
HEMATOCRIT: 43.9 % (ref 39.0–48.0)
HEMOGLOBIN: 15.3 g/dL (ref 12.9–16.5)
LYMPHOCYTES ABSOLUTE COUNT: 1 10*9/L — ABNORMAL LOW (ref 1.1–3.6)
LYMPHOCYTES RELATIVE PERCENT: 14 %
MEAN CORPUSCULAR HEMOGLOBIN CONC: 34.9 g/dL (ref 32.0–36.0)
MEAN CORPUSCULAR HEMOGLOBIN: 28.7 pg (ref 25.9–32.4)
MEAN CORPUSCULAR VOLUME: 82.2 fL (ref 77.6–95.7)
MEAN PLATELET VOLUME: 8 fL (ref 6.8–10.7)
MONOCYTES ABSOLUTE COUNT: 0.6 10*9/L (ref 0.3–0.8)
MONOCYTES RELATIVE PERCENT: 7.7 %
NEUTROPHILS ABSOLUTE COUNT: 5.7 10*9/L (ref 1.8–7.8)
NEUTROPHILS RELATIVE PERCENT: 76.8 %
PLATELET COUNT: 179 10*9/L (ref 150–450)
RED BLOOD CELL COUNT: 5.33 10*12/L (ref 4.26–5.60)
RED CELL DISTRIBUTION WIDTH: 13.7 % (ref 12.2–15.2)
WBC ADJUSTED: 7.5 10*9/L (ref 3.6–11.2)

## 2020-09-20 LAB — LIPID PANEL
CHOLESTEROL/HDL RATIO SCREEN: 3.9 (ref 1.0–4.5)
CHOLESTEROL: 143 mg/dL (ref <=200)
HDL CHOLESTEROL: 37 mg/dL — ABNORMAL LOW (ref 40–60)
LDL CHOLESTEROL CALCULATED: 68 mg/dL (ref 40–99)
NON-HDL CHOLESTEROL: 106 mg/dL (ref 70–130)
TRIGLYCERIDES: 188 mg/dL — ABNORMAL HIGH (ref 0–150)
VLDL CHOLESTEROL CAL: 37.6 mg/dL (ref 12–42)

## 2020-09-20 LAB — PROTIME-INR
INR: 1.92
PROTIME: 22.3 s — ABNORMAL HIGH (ref 10.3–13.4)

## 2020-09-20 LAB — TACROLIMUS LEVEL, TIMED: TACROLIMUS BLOOD: 4.1 ng/mL

## 2020-09-20 NOTE — Unmapped (Signed)
TOlerating coumadin well.  Check INR toyda.

## 2020-09-20 NOTE — Unmapped (Signed)
Patient ID: David Ellis is a 75 y.o. male who presents for eval of medical issues.      Assessment/Plan:        Type II diabetes mellitus with renal manifestations, uncontrolled (CMS-HCC)  Sugars had bumped a bit last month; he'll continue to follow up with Endocrinology every 3-6 months.    Essential (primary) hypertension  REally outstanding control on current medical therapy.  Creatinine remains stable.  On lasix daily for fluid control.    Transplanted kidney  Creatinine stable.  Checking creatinine and tac levels every month.    Other pulmonary embolism without acute cor pulmonale (CMS-HCC)  TOlerating coumadin well.  Check INR toyda.    Atrial fibrillation (CMS-HCC)  TOlerating coumadin well.  INR today.        Orders Placed This Encounter   Procedures   ??? Comprehensive Metabolic Panel   ??? Lipid Panel   ??? CBC w/ Differential   ??? PT-INR   ??? Tacrolimus Level, Timed ()       Medication adherence and barriers to the treatment plan have been addressed. Opportunities to optimize healthy behaviors have been discussed. Patient / caregiver voiced understanding.       Return in about 6 months (around 03/22/2021) for Annual physical.     Subjective:     Current Health Status  Patient Active Problem List    Diagnosis Date Noted   ??? Diabetic ulcer of left foot associated with type 2 diabetes mellitus (CMS-HCC) 07/03/2014   ??? Routine general medical examination at a health care facility 11/17/2013   ??? Atrial fibrillation (CMS-HCC) 07/06/2013   ??? Right ventricular dysfunction 06/30/2013   ??? Other pulmonary embolism without acute cor pulmonale (CMS-HCC) 05/23/2013   ??? Transplanted kidney    ??? Mixed hyperlipidemia 10/30/2011   ??? Type II diabetes mellitus with renal manifestations, uncontrolled (CMS-HCC) 03/26/2011   ??? Essential (primary) hypertension 10/10/2010   ??? Syncope 02/03/2019   ??? Immunosuppressed status (CMS-HCC) 09/02/2018   ??? Class 2 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 01/01/2018   ??? Hypoglycemia associated with type 2 diabetes mellitus (CMS-HCC) 09/03/2016   ??? BCC (basal cell carcinoma), face 07/18/2015   ??? Left knee pain 11/09/2014   ??? 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 Acquired absence of other toe(s), unspecified side (CMS-HCC) 08/10/2014   ??? Type 2 diabetes mellitus with foot ulcer (CODE) (CMS-HCC) 07/03/2014   ??? Diabetic polyneuropathy associated with type 2 diabetes mellitus (CMS-HCC) 03/15/2014   ??? Aftercare following organ transplant 08/27/2013   ??? Edema 06/30/2013   ??? 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 History of kidney transplant 10/30/2011   ??? 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 Diverticulosis of colon 03/09/2011   ??? History of colonic polyps 03/09/2011      Here today for eval of medical issues.    He continues to see wound clinic for his lower extremity wounds. He reports that the ulceration and burn have completely resolved.  His wife looks at his feet every day.      He reports that his sugars have been doing well.  He sees Dr. Tiburcio Pea for DM.  A1C was 8.8 in March.      Seeing Dr. Arvin Collard in Jan and things were stable.  Kidneys stable.      The patient is without complaints of chest pain, sob, fever, chills, nausea, vomiting, diarrhea, constipation, BRBPR, melena, abdominal pains, weight changes, confusion, headaches.  Allergies   Allergen Reactions   ??? Levofloxacin Other (See Comments)     Other reaction(s): Unknown  Unknown   ??? Lidocaine Other (See Comments)     I don't remember States is fine with Bupivicaine   ??? Penicillins      As child  Other reaction(s): UNKNOWN.   (UPDATE: Tolerated amp/sulbactam without side effects during 10/2014 hospitalization)   ??? Egg Derived Rash   ??? Enalapril Cough   ??? Epinephrine Palpitations   ??? Grass Pollen-[place], Standard Itching   ??? Lisinopril Rash   ??? Mepivacaine Hcl Palpitations       Current Outpatient Medications   Medication Sig Dispense Refill   ??? aspirin (ECOTRIN) 81 MG tablet Take 81 mg by mouth daily as needed (when flying).     ??? cholecalciferol, vitamin D3, (VITAMIN D3) 2,000 unit Tab Take 2,000 Units by mouth daily.      ??? furosemide (LASIX) 40 MG tablet Take 1-2 tablets daily as needed for edema. 180 tablet 3   ??? gabapentin (NEURONTIN) 300 MG capsule Take 1 capsule (300 mg total) by mouth Two (2) times a day. 180 capsule 3   ??? HUMULIN R U-500, CONC, KWIKPEN 500 unit/mL (3 mL) CONCENTRATED injection DIAL TO MAX 100 UNITS BEFORE BREAKFAST, LUNCH AND DINNER. INJECT 30 MINUTES BEFORE MEALS. MAX OF 300 UNITS DAILY. 18 mL 11   ??? losartan (COZAAR) 25 MG tablet TAKE ONE TABLET BY MOUTH EVERY DAY 90 tablet 3   ??? magnesium chloride (SLOW_MAG) 64 mg TbEC Take 128 mg by mouth three (3) times a day (at 6am, noon and 6pm). 9 am 1 pm and 9 pm     ??? metoprolol succinate (TOPROL-XL) 100 MG 24 hr tablet TAKE ONE TABLET TWICE DAILY 180 tablet 1   ??? miscellaneous medical supply Misc 1 application by Miscellaneous route daily as needed. Lightweight wheelchair. 1 each 0   ??? miscellaneous medical supply Misc 1 application by Miscellaneous route nightly. Recommend CPAP 14 cm H2O with EPR 3 and heated humidifier for nasal dryness, mask: ResMed Activa Lt nasal mask (Large). 1 each 0   ??? multivitamin capsule Take 1 capsule by mouth daily.     ??? MYFORTIC 180 mg EC tablet Take 3 tablets (540 mg total) by mouth two (2) times a day. 540 tablet 3   ??? pen needle, diabetic (BD ULTRA-FINE NANO PEN NEEDLE) 32 gauge x 5/32 (4 mm) Ndle 1 each by Other route Three (3) times a day before meals. 300 each 3   ??? pravastatin (PRAVACHOL) 20 MG tablet TAKE ONE TABLET BY MOUTH EVERY DAY 90 tablet 3   ??? tacrolimus (PROGRAF) 1 MG capsule Take 3 capsules (3 mg total) by mouth in the morning AND 2 capsules (2 mg total) in the evening. 450 capsule 3   ??? TRULICITY 1.5 mg/0.5 mL PnIj INJECT 1.5MG (0.5ML) SUBCUTANEOUSLY ONCE A WEEK 2 mL 0   ??? warfarin (COUMADIN) 2.5 MG tablet Take 1 tablet (2.5 mg total) by mouth daily. (Patient taking differently: Take 2.5 mg by mouth once a week. On Mondays 2.5mg  PO) 30 tablet 11   ??? warfarin (JANTOVEN) 5 MG tablet Take 1 tablet (5 mg total) by mouth daily with evening meal. TAKE 6 DAYS A WEEK - Tuesday through Sunday 90 tablet 3   ??? blood-glucose meter kit Use as instructed. 1 each 0     No current facility-administered medications for this visit.     Facility-Administered Medications Ordered in Other Visits  Medication Dose Route Frequency Provider Last Rate Last Admin   ??? bevacizumab    PRN (once a day) Melvyn Novas, MD   2.5 mg at 01/28/17 0750       Past Medical History:   Diagnosis Date   ??? Atrial flutter (CMS-HCC)    ??? Diabetes mellitus (CMS-HCC)    ??? Diabetic nephropathy (CMS-HCC)    ??? Diabetic retinopathy (CMS-HCC)    ??? Fractures    ??? Ganglion cyst    ??? Hand injury    ??? Heart disease    ??? Hyperlipidemia    ??? Hypertension    ??? Joint pain    ??? Osteomyelitis (CMS-HCC) June 2016   ??? Pulmonary embolism (CMS-HCC) Dec 2015   ??? 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 Stroke (CMS-HCC)    ??? Tear of meniscus of knee    ??? 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 NEPHRECTOMY TRANSPLANTED ORGAN Right     LURD - spouse received in 2013.   ??? PR AMPUTATION METATARSAL+TOE,SINGLE Left 07/06/2014    Procedure: AMPUTATION, METATARSAL, WITH TOE SINGLE;  Surgeon: Marion Downer, MD;  Location: MAIN OR [facility];  Service: Vascular   ??? PR AMPUTATION METATARSAL+TOE,SINGLE Left 10/28/2014    Procedure: AMPUTATION, METATARSAL, WITH TOE SINGLE;  Surgeon: Maple Mirza, MD;  Location: MAIN OR [facility];  Service: Vascular   ??? PR VITRECTOMY,PANRETINAL LASER RX Right 11/26/2016    Procedure: VITRECTOMY, MECHANICAL, PARS PLANA APPROACH; WITH ENDOLASER PANRETINAL PHOTOCOAGULATION;  Surgeon: Melvyn Novas, MD;  Location[facility];  Service: Ophthalmology   ??? PR VITRECTOMY,PANRETINAL LASER RX Left 01/28/2017    Procedure: VITRECTOMY, MECHANICAL, PARS PLANA APPROACH; WITH ENDOLASER PANRETINAL PHOTOCOAGULATION;  Surgeon: Melvyn Novas, MD;  Location: [facility] OR [facility];  Service: Ophthalmology   ??? SKIN BIOPSY         Family History   Problem Relation Age of Onset   ??? Kidney disease Mother    ??? Diabetes Mother    ??? Kidney disease Father    ??? Kidney disease Maternal Grandmother    ??? Diabetes Maternal Grandmother    ??? Glaucoma Neg Hx    ??? Macular degeneration Neg Hx    ??? Strabismus Neg Hx        Social History     Tobacco Use   ??? Smoking status: Never Smoker   ??? Smokeless tobacco: Never Used   Substance Use Topics   ??? Alcohol use: No   ??? Drug use: No             Objective:       Vital Signs  BP 120/60  - Pulse 78  - Temp 36.6 ??C (97.8 ??F) (Temporal)  - Resp 18  - Wt (!) 115.8 kg (255 lb 3.2 oz)  - SpO2 97%  - BMI 33.67 kg/m??      Exam  Physical Exam:    Well developed, well nourished male in no acute distress.      Vitals:    09/20/20 0823   BP: 120/60   Pulse: 78   Resp: 18   Temp: 36.6 ??C (97.8 ??F)   SpO2: 97%     LUNGS:  CTA  CARDIOVASCULAR:  RRR without murmurs, or gallops, or rubs.  Pulses 2+ bilaterally and symmetric.    ABDOMEN:  NABS/  NT/ND/soft.  NO hsm nor abdominal masses.    EXTREMITIES:  No c/c/e.  No ulcers or abrasions.

## 2020-09-20 NOTE — Unmapped (Signed)
TOlerating coumadin well.  INR today.

## 2020-09-20 NOTE — Unmapped (Signed)
Problem List Items Addressed This Visit        High    Atrial fibrillation (CMS-HCC)    Relevant Orders    Comprehensive Metabolic Panel    Lipid Panel    CBC w/ Differential    PT-INR    Type II diabetes mellitus with renal manifestations, uncontrolled (CMS-HCC) - Primary      Other Visit Diagnoses     Screening for prostate cancer        Relevant Orders    Tacrolimus Level, Timed ()    Abnormal finding of blood chemistry, unspecified         Relevant Orders    Lipid Panel

## 2020-09-20 NOTE — Unmapped (Signed)
REally outstanding control on current medical therapy.  Creatinine remains stable.  On lasix daily for fluid control.

## 2020-09-20 NOTE — Unmapped (Signed)
Sugars had bumped a bit last month; he'll continue to follow up with Endocrinology every 3-6 months.

## 2020-09-20 NOTE — Unmapped (Signed)
Creatinine stable.  Checking creatinine and tac levels every month.

## 2020-09-21 NOTE — Unmapped (Signed)
PT notified that either vaccine is okay. And clarification requested for missing dose. Pt not able to confirm or deny if the tablet left in the pill box was a warfarin. But wanted to let us know.

## 2020-09-22 NOTE — Unmapped (Signed)
Pt notified of provider suggestions. Pt verbalized understanding.

## 2020-10-05 NOTE — Unmapped (Signed)
[facility] Refill Coordination Note    Specialty Medication(s) to be Shipped:   Transplant: Myfortic 180mg  and tacrolimus 1mg     Other medication(s) to be shipped: No additional medications requested for fill at this time     David Ellis, DOB: 06/27/1945  Phone: [phone] (home)       All above HIPAA information was verified with patient.     Was a Nurse, learning disability used for this call? No    Completed refill call assessment today to schedule patient's medication shipment from the [facility] [phone]).  All relevant notes have been reviewed.     Specialty medication(s) and dose(s) confirmed: Regimen is correct and unchanged.   Changes to medications: David Ellis reports no changes at this time.  Changes to insurance: No  New side effects reported not previously addressed with a pharmacist or physician: None reported  Questions for the pharmacist: No    Confirmed patient received a Conservation officer, historic buildings and a Surveyor, mining with first shipment. The patient will receive a drug information handout for each medication shipped and additional FDA Medication Guides as required.       DISEASE/MEDICATION-SPECIFIC INFORMATION        N/A    SPECIALTY MEDICATION ADHERENCE     Medication Adherence    Patient reported X missed doses in the last month: 0  Specialty Medication: Myfortic 180mg   Patient is on additional specialty medications: Yes  Additional Specialty Medications: Tacrolimus 1mg   Patient Reported Additional Medication X Missed Doses in the Last Month: 0  Patient is on more than two specialty medications: No  Adherence tools used: patient uses a pill box to manage medications  Support network for adherence: family member        Were doses missed due to medication being on hold? No    Myfortic 180 mg: 11 days of medicine on hand   Tacrolimus 1 mg: 11 days of medicine on hand     REFERRAL TO PHARMACIST     Referral to the pharmacist: Not needed      SHIPPING     Shipping address confirmed in Epic.     Delivery Scheduled: Yes, Expected medication delivery date: 10/14/2020.     Medication will be delivered via Next Day Courier to the prescription address in Epic WAM.    [name]   [facility] Specialty Technician

## 2020-10-13 MED FILL — MYFORTIC 180 MG TABLET,DELAYED RELEASE: ORAL | 30 days supply | Qty: 180 | Fill #10

## 2020-10-13 MED FILL — TACROLIMUS 1 MG CAPSULE, IMMEDIATE-RELEASE: ORAL | 30 days supply | Qty: 150 | Fill #5

## 2020-10-14 DIAGNOSIS — E11649 Type 2 diabetes mellitus with hypoglycemia without coma: Principal | ICD-10-CM

## 2020-10-14 MED ORDER — FREESTYLE LIBRE 14 DAY SENSOR KIT
11 refills | 0 days | Status: CP
Start: 2020-10-14 — End: ?

## 2020-10-14 MED ORDER — FREESTYLE LIBRE 14 DAY READER
Freq: Once | 0 refills | 0 days | Status: CP
Start: 2020-10-14 — End: 2020-10-14

## 2020-10-14 NOTE — Unmapped (Signed)
Pt called with concerns that he just got back from beach, and realized he left his Freestyle Reader #1 behind in room.  He called, but was not found.  I did not ask if he had phone that he could use instead.    I have sent new script to his [facility] pharmacy for both reader and sensors as back up in case he wants to buy (I presume out of pocket). (he prefers #1 with no alerts).  I also offered back up w POC if needed to wait for new shipment from [facility], but he prefers CGM.     He is  asking if we could request [facility] to send him new?  I cannot send script to them directly via Epic.  Marcelino Duster, would you have any time to share you thoughts with him?

## 2020-10-14 NOTE — Unmapped (Signed)
Patient called asking for a CMN for a new Libre reader because his was lost.  Instructed patient to get in touch with [employer] and have them send the paperwork.  Routed to [place].

## 2020-10-18 NOTE — Unmapped (Unsigned)
[facility] of Vascular Surgery  [facility] and [facility] - Established Clinic Visit  [address], [address]  [place], [place]  [address]      Past Medical History:   Diagnosis Date   ??? Atrial flutter (CMS-HCC)    ??? Diabetes mellitus (CMS-HCC)    ??? Diabetic nephropathy (CMS-HCC)    ??? Diabetic retinopathy (CMS-HCC)    ??? Fractures    ??? Ganglion cyst    ??? Hand injury    ??? Heart disease    ??? Hyperlipidemia    ??? Hypertension    ??? Joint pain    ??? Osteomyelitis (CMS-HCC) June 2016   ??? Pulmonary embolism (CMS-HCC) Dec 2015   ??? 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 Stroke (CMS-HCC)    ??? Tear of meniscus of knee    ??? Transplanted kidney 08/21/2011     HPI:   David Ellis is a 75 y.o.male who returns for a follow up visit. PMH is significant for h/o renal transplant (on tacrolimus), DM II with neuropathy, HTN, PAD, left 4th/5th toe amputation, A fib (on warfarin), S/P kidney transplant, on immunosuppressive therapy. The patient was seen by Dr. Melburn Hake on 08/29/20 where left foot ulcers of the 1st-2nd toes and heel had healed. 08/12/20 arterial duplexes showed RLE outflow disease and BLE run off disease with RGT pressure at 86 and L 2nd toe pressure at 100.    ***    ROS: Denies recent fevers, shaking chills, or night sweats.             Denies any increased drainage or exudate from the wound.             No reports of increased pain from the wound.             No reports of increased edema of the lower extremity.             Denies any claudication or rest pain symptoms.             Denies any current shortness of breath or chest pain.             Remainder of 10 system review is negative except that mentioned in the history of present illness.    Physical exam  General: 75 y.o. year old male in no acute distress in the clinic today.    There were no vitals filed for this visit.    ***wound dimensions    Chest: No increased work of breathing on room air.  Cardiac: regular rate and rhythm.  Vascular: ***  Extremities:     Procedure: CPT {DEBRIDEMENT:78774}  {KHDEBRIDEMENTS:83172}     Imaging (reviewed independently):  BLE arterial duplex 08/12/20 - RLE arterial obstruction involving SFA and/or popliteal artery and tibioperoneal vessels. LLE arterial obstruction involving the tibioperoneal vessels. RGT pressure 86 mmHg. L 2nd toe pressure 100 mmHg    Assessment:  David Ellis is a 75 y.o. male with a history of renal transplant (on tacrolimus), DM II, HTN, PAD, left 4th/5th toe amputation and A fib (on warfarin), S/P kidney transplant, on immunosuppressive therapy. BLE duplexes from 08/12/20 showed RLE RLE outflow obstruction and BLE runoff obstruction. ***    Treatment Plan:  1.

## 2020-10-19 DIAGNOSIS — E1165 Type 2 diabetes mellitus with hyperglycemia: Principal | ICD-10-CM

## 2020-10-19 DIAGNOSIS — E1129 Type 2 diabetes mellitus with other diabetic kidney complication: Principal | ICD-10-CM

## 2020-10-19 NOTE — Unmapped (Signed)
Rescheduling appointment that was cancelled by provider

## 2020-10-20 ENCOUNTER — Other Ambulatory Visit: Admit: 2020-10-20 | Discharge: 2020-10-21 | Payer: MEDICARE

## 2020-10-20 LAB — CBC W/ AUTO DIFF
BASOPHILS ABSOLUTE COUNT: 0 10*9/L (ref 0.0–0.1)
BASOPHILS RELATIVE PERCENT: 0.4 %
EOSINOPHILS ABSOLUTE COUNT: 0.1 10*9/L (ref 0.0–0.5)
EOSINOPHILS RELATIVE PERCENT: 1.2 %
HEMATOCRIT: 46.7 % (ref 39.0–48.0)
HEMOGLOBIN: 15.5 g/dL (ref 12.9–16.5)
LYMPHOCYTES ABSOLUTE COUNT: 1.1 10*9/L (ref 1.1–3.6)
LYMPHOCYTES RELATIVE PERCENT: 15.5 %
MEAN CORPUSCULAR HEMOGLOBIN CONC: 33.2 g/dL (ref 32.0–36.0)
MEAN CORPUSCULAR HEMOGLOBIN: 28.1 pg (ref 25.9–32.4)
MEAN CORPUSCULAR VOLUME: 84.5 fL (ref 77.6–95.7)
MEAN PLATELET VOLUME: 7.9 fL (ref 6.8–10.7)
MONOCYTES ABSOLUTE COUNT: 0.5 10*9/L (ref 0.3–0.8)
MONOCYTES RELATIVE PERCENT: 6.7 %
NEUTROPHILS ABSOLUTE COUNT: 5.5 10*9/L (ref 1.8–7.8)
NEUTROPHILS RELATIVE PERCENT: 76.2 %
NUCLEATED RED BLOOD CELLS: 0 /100{WBCs} (ref <=4)
PLATELET COUNT: 195 10*9/L (ref 150–450)
RED BLOOD CELL COUNT: 5.53 10*12/L (ref 4.26–5.60)
RED CELL DISTRIBUTION WIDTH: 14.3 % (ref 12.2–15.2)
WBC ADJUSTED: 7.2 10*9/L (ref 3.6–11.2)

## 2020-10-20 LAB — PROTIME-INR
INR: 2.3
PROTIME: 26.7 s — ABNORMAL HIGH (ref 10.3–13.4)

## 2020-10-20 LAB — BASIC METABOLIC PANEL
ANION GAP: 5 mmol/L (ref 5–14)
BLOOD UREA NITROGEN: 26 mg/dL — ABNORMAL HIGH (ref 9–23)
BUN / CREAT RATIO: 19
CALCIUM: 9.1 mg/dL (ref 8.7–10.4)
CHLORIDE: 105 mmol/L (ref 98–107)
CO2: 26.8 mmol/L (ref 20.0–31.0)
CREATININE: 1.34 mg/dL — ABNORMAL HIGH
EGFR CKD-EPI AA MALE: 60 mL/min/{1.73_m2} (ref >=60)
EGFR CKD-EPI NON-AA MALE: 52 mL/min/{1.73_m2} — ABNORMAL LOW (ref >=60)
GLUCOSE RANDOM: 253 mg/dL — ABNORMAL HIGH (ref 70–179)
POTASSIUM: 4.8 mmol/L (ref 3.5–5.1)
SODIUM: 137 mmol/L (ref 135–145)

## 2020-10-20 LAB — MAGNESIUM: MAGNESIUM: 1.8 mg/dL (ref 1.6–2.6)

## 2020-10-20 LAB — PHOSPHORUS: PHOSPHORUS: 3.3 mg/dL (ref 2.4–5.1)

## 2020-10-20 NOTE — Unmapped (Signed)
Called and notified patient of results as advised by provider. Patient was appreciative of the call.

## 2020-10-21 LAB — TACROLIMUS LEVEL: TACROLIMUS BLOOD: 4.1 ng/mL

## 2020-10-21 MED ORDER — TRULICITY 1.5 MG/0.5 ML SUBCUTANEOUS PEN INJECTOR
11 refills | 0 days | Status: CP
Start: 2020-10-21 — End: ?

## 2020-10-24 NOTE — Unmapped (Addendum)
Prior Authorization for [employer]        [employer] L[facility]??- Rx #: [id number]      Message sent to pt to call his pharmacy and ask them to run his Rx under his Medicare Part B.     Message sent to Dr. Tiburcio Pea to review.

## 2020-11-01 ENCOUNTER — Ambulatory Visit: Payer: Medicare Other | Admitting: Dermatology

## 2020-11-07 NOTE — Unmapped (Addendum)
[facility] Refill Coordination Note    Specialty Medication(s) to be Shipped:   Transplant: Myfortic 180mg  and tacrolimus 1mg     Other medication(s) to be shipped: No additional medications requested for fill at this time     David Ellis, DOB: 18-Jul-1945  Phone: [phone] (home)       All above HIPAA information was verified with patient.     Was a Nurse, learning disability used for this call? No    Completed refill call assessment today to schedule patient's medication shipment from the [facility] [phone]).  All relevant notes have been reviewed.     Specialty medication(s) and dose(s) confirmed: Regimen is correct and unchanged.   Changes to medications: David Ellis reports no changes at this time.  Changes to insurance: No  New side effects reported not previously addressed with a pharmacist or physician: None reported  Questions for the pharmacist: No    Confirmed patient received a Conservation officer, historic buildings and a Surveyor, mining with first shipment. The patient will receive a drug information handout for each medication shipped and additional FDA Medication Guides as required.       DISEASE/MEDICATION-SPECIFIC INFORMATION        N/A    SPECIALTY MEDICATION ADHERENCE     Medication Adherence    Patient reported X missed doses in the last month: 0  Specialty Medication: MYFORTIC 180 mg EC tablet  Patient is on additional specialty medications: Yes  Additional Specialty Medications: tacrolimus (PROGRAF) 1 MG capsule  Patient Reported Additional Medication X Missed Doses in the Last Month: 0  Patient is on more than two specialty medications: No  Adherence tools used: patient uses a pill box to manage medications  Support network for adherence: family member              Were doses missed due to medication being on hold? No    : Myfortic 180mg  8 days worth of medication on hand.  tacrolimus 1mg   8 days worth of medication on hand.      REFERRAL TO PHARMACIST     Referral to the pharmacist: Not needed      [facility]     Shipping address confirmed in Epic.     Delivery Scheduled: Yes, Expected medication delivery date: 11/10/20.     Medication will be delivered via Next Day Courier to the prescription address in Epic WAM.    David Ellis   [facility] Specialty Technician

## 2020-11-09 MED FILL — TACROLIMUS 1 MG CAPSULE, IMMEDIATE-RELEASE: ORAL | 30 days supply | Qty: 150 | Fill #6

## 2020-11-09 MED FILL — MYFORTIC 180 MG TABLET,DELAYED RELEASE: ORAL | 30 days supply | Qty: 180 | Fill #11

## 2020-11-16 ENCOUNTER — Institutional Professional Consult (permissible substitution): Admit: 2020-11-16 | Discharge: 2020-11-17 | Payer: MEDICARE

## 2020-11-16 NOTE — Unmapped (Signed)
Diabetes Education Note    Referring Provider:  Leretha Pol, MD    Time In / Out: 2:50-3:50  60 min    With wife Sherill;  Assessment:   Pt presents for education and support with libre 14 day and libreview account.  Glucose data shows lows between 12-5:30pm. Last A1c 8.09 August 2020.  See CGM data below.     Plan:      Education Intervention:  -- libreview account confusion clarified. Pt is active and sharing with gmail account. This is also the account he can use at home to view data. No need to upload as pt is using the app  -- U-500 insulin discussed for safety as pt is having lost post lunch  Taken for sure 30 min before breakfast and dinner  Do not take lunch dose if meal is skipped or no carbs  rec taking the lighter amount at lunch    19,4,8 for his meal doses. If lunch has no carbs don't take the 4.      Educator Recommendations / Plan for Supervising Physician:  -- see education provided above    Subjective:        Desired Leaning Objective of Today  -- libre view account     Diabetes Meds:   Trulicity 1.5mg   Humulin R U-500, 19, 8, 6  If sugar under 100 will only take half of the insulin    Monitoring  -- Are you currently monitoring your sugar?  Libre 14 day    Nutrition:  -- Are you currently following any special diet or meal plan?  no  yes  Not doing it though    -- 24hr recall  How Many Meals per day?   3 but lunch is light      Objective:       Past Medical History:   Diagnosis Date   ??? Atrial flutter (CMS-HCC)    ??? Diabetes mellitus (CMS-HCC)    ??? Diabetic nephropathy (CMS-HCC)    ??? Diabetic retinopathy (CMS-HCC)    ??? Fractures    ??? Ganglion cyst    ??? Hand injury    ??? Heart disease    ??? Hyperlipidemia    ??? Hypertension    ??? Joint pain    ??? Osteomyelitis (CMS-HCC) June 2016   ??? Pulmonary embolism (CMS-HCC) Dec 2015   ??? 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 Stroke (CMS-HCC)    ??? Tear of meniscus of knee    ??? Transplanted kidney 08/21/2011       Relevant Labs  Results for TAAJ, HURLBUT (MRN [id number]) as of 11/16/2020 15:59   Ref. Range 08/08/2020 08:15   Hemoglobin A1c Latest Ref Range: 4.8 - 5.6 % 8.8 (H)   Estimated Average Glucose Latest Units: mg/dL 811     Glucose Data                        Cathrine Muster. RD. CDCES

## 2020-11-22 NOTE — Unmapped (Signed)
[place] [facility] of V[facility] Surgery  [facility] - Established Clinic Visit  [address]  [place], [place]  [address]    Past Medical History:   Diagnosis Date   ??? Atrial flutter (CMS-HCC)    ??? Diabetes mellitus (CMS-HCC)    ??? Diabetic nephropathy (CMS-HCC)    ??? Diabetic retinopathy (CMS-HCC)    ??? Fractures    ??? Ganglion cyst    ??? Hand injury    ??? Heart disease    ??? Hyperlipidemia    ??? Hypertension    ??? Joint pain    ??? Osteomyelitis (CMS-HCC) June 2016   ??? Pulmonary embolism (CMS-HCC) Dec 2015   ??? 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 Stroke (CMS-HCC)    ??? Tear of meniscus of knee    ??? 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 He was last seen by Dr. Melburn Hake on 08/29/20, where his diabetic foot ulcers had healed.     He reports his ulcers remain healed. He reports decreased swelling of his legs. He reports some balance issues and states he is not able to play golf because of this. His wife who is with him in clinic reports he has prior history of DVT.     ROS: Denies recent fevers, shaking chills, or night sweats.             Denies any increased drainage or exudate from the wound.             No reports of increased pain from the wound.             No reports of increased edema of the lower extremity.             Denies any claudication or rest pain symptoms.             Denies any current shortness of breath or chest pain.             Remainder of 10 system review is negative except that mentioned in the history of present illness.    Physical exam  General: 75 y.o. year old male in no acute distress in the clinic today.    There were no vitals filed for this visit.    Chest: No increased work of breathing on room air.  Cardiac: regular rate and rhythm.   Extremities: LLE ulcers remain healed. Edema well controlled.     Imaging (reviewed independently):  BLE Arterial Duplexes (08/12/20): RLE showed obstruction of the SFA and/or popliteal artery and tibioperoneal vessels. RGT pressure was 86 mmHg. LLE showed obstruction of the tibioperoneal vessels. LT 2nd toe pressure was 100 mm Hg.     Assessment:  75 y.o. male with PMH of renal transplant (on tacrolimus), DM II, HTN, PAD, left 4th/5th toe amputation and A fib (on warfarin). LLE wounds remained healed. BLE arterial duplexes from 08/12/20 reviewed with patient in detail. Per results and since patient does not have current symptoms such as non-healing wound, claudication or pain at rest, surgical intervention is not warranted.     Treatment Plan:  1. Continue with general  cardiovascular health such as exercise on a stationary bike.  2. Do compression with stocking and flwhen edema occurs.   3. Continue medical management of CV risk factors per Dr. Dellis Filbert  4. RTC PRN.   ______________________________________________________________________    Documentation assistance was provided by Abelino Derrick Scribe, on November 23, 2020 at 1:54 PM for Tobi Bastos, MD and Lattie Corns, PA-C.    ----------------------------------------------------------------------------------------------------------------------  November 23, 2020 2:20 PM. Documentation assistance provided by the Scribe. I was present during the time the encounter was recorded. The information recorded by the Scribe was done at my direction and has been reviewed and validated by me.  ----------------------------------------------------------------------------------------------------------------------

## 2020-11-23 ENCOUNTER — Ambulatory Visit: Admit: 2020-11-23 | Discharge: 2020-11-24 | Payer: MEDICARE | Attending: Vascular Surgery | Primary: Vascular Surgery

## 2020-11-23 DIAGNOSIS — E1151 Type 2 diabetes mellitus with diabetic peripheral angiopathy without gangrene: Principal | ICD-10-CM

## 2020-11-24 ENCOUNTER — Ambulatory Visit: Admit: 2020-11-24 | Discharge: 2020-11-25 | Payer: MEDICARE

## 2020-11-24 LAB — BASIC METABOLIC PANEL
ANION GAP: 6 mmol/L (ref 5–14)
BLOOD UREA NITROGEN: 22 mg/dL (ref 9–23)
BUN / CREAT RATIO: 16
CALCIUM: 9.3 mg/dL (ref 8.7–10.4)
CHLORIDE: 105 mmol/L (ref 98–107)
CO2: 29.3 mmol/L (ref 20.0–31.0)
CREATININE: 1.37 mg/dL — ABNORMAL HIGH
EGFR CKD-EPI (2021) MALE: 54 mL/min/{1.73_m2} — ABNORMAL LOW (ref >=60)
GLUCOSE RANDOM: 296 mg/dL — ABNORMAL HIGH (ref 70–179)
POTASSIUM: 5.5 mmol/L — ABNORMAL HIGH (ref 3.4–4.8)
SODIUM: 140 mmol/L (ref 135–145)

## 2020-11-24 LAB — CBC W/ AUTO DIFF
BASOPHILS ABSOLUTE COUNT: 0 10*9/L (ref 0.0–0.1)
BASOPHILS RELATIVE PERCENT: 0.5 %
EOSINOPHILS ABSOLUTE COUNT: 0.1 10*9/L (ref 0.0–0.5)
EOSINOPHILS RELATIVE PERCENT: 1.4 %
HEMATOCRIT: 45.5 % (ref 39.0–48.0)
HEMOGLOBIN: 15.5 g/dL (ref 12.9–16.5)
LYMPHOCYTES ABSOLUTE COUNT: 1.2 10*9/L (ref 1.1–3.6)
LYMPHOCYTES RELATIVE PERCENT: 17.5 %
MEAN CORPUSCULAR HEMOGLOBIN CONC: 34.1 g/dL (ref 32.0–36.0)
MEAN CORPUSCULAR HEMOGLOBIN: 28.4 pg (ref 25.9–32.4)
MEAN CORPUSCULAR VOLUME: 83.5 fL (ref 77.6–95.7)
MEAN PLATELET VOLUME: 8 fL (ref 6.8–10.7)
MONOCYTES ABSOLUTE COUNT: 0.5 10*9/L (ref 0.3–0.8)
MONOCYTES RELATIVE PERCENT: 7.2 %
NEUTROPHILS ABSOLUTE COUNT: 4.9 10*9/L (ref 1.8–7.8)
NEUTROPHILS RELATIVE PERCENT: 73.4 %
NUCLEATED RED BLOOD CELLS: 0 /100{WBCs} (ref <=4)
PLATELET COUNT: 188 10*9/L (ref 150–450)
RED BLOOD CELL COUNT: 5.45 10*12/L (ref 4.26–5.60)
RED CELL DISTRIBUTION WIDTH: 14.1 % (ref 12.2–15.2)
WBC ADJUSTED: 6.6 10*9/L (ref 3.6–11.2)

## 2020-11-24 LAB — PHOSPHORUS: PHOSPHORUS: 4.5 mg/dL (ref 2.4–5.1)

## 2020-11-24 LAB — PROTIME-INR
INR: 2.43
PROTIME: 28.2 s — ABNORMAL HIGH (ref 10.3–13.4)

## 2020-11-24 LAB — MAGNESIUM: MAGNESIUM: 2 mg/dL (ref 1.6–2.6)

## 2020-11-24 LAB — TACROLIMUS LEVEL: TACROLIMUS BLOOD: 6.3 ng/mL

## 2020-12-01 DIAGNOSIS — E1142 Type 2 diabetes mellitus with diabetic polyneuropathy: Principal | ICD-10-CM

## 2020-12-01 MED ORDER — HUMULIN R U-500 (CONC) INSULIN KWIKPEN 500 UNIT/ML (3 ML) SUBCUTANEOUS
11 refills | 0.00000 days
Start: 2020-12-01 — End: ?

## 2020-12-02 MED ORDER — HUMULIN R U-500 (CONC) INSULIN KWIKPEN 500 UNIT/ML (3 ML) SUBCUTANEOUS
11 refills | 0 days | Status: CP
Start: 2020-12-02 — End: ?

## 2020-12-08 ENCOUNTER — Ambulatory Visit: Admit: 2020-12-08 | Discharge: 2020-12-09 | Payer: MEDICARE | Attending: Internal Medicine | Primary: Internal Medicine

## 2020-12-08 DIAGNOSIS — E785 Hyperlipidemia, unspecified: Principal | ICD-10-CM

## 2020-12-08 DIAGNOSIS — R609 Edema, unspecified: Principal | ICD-10-CM

## 2020-12-08 DIAGNOSIS — Z940 Kidney transplant status: Principal | ICD-10-CM

## 2020-12-08 DIAGNOSIS — I4891 Unspecified atrial fibrillation: Principal | ICD-10-CM

## 2020-12-08 DIAGNOSIS — I10 Essential (primary) hypertension: Principal | ICD-10-CM

## 2020-12-08 MED ORDER — MYFORTIC 180 MG TABLET,DELAYED RELEASE
ORAL_TABLET | Freq: Two times a day (BID) | ORAL | 3 refills | 90 days | Status: CP
Start: 2020-12-08 — End: 2021-12-08
  Filled 2020-12-19: qty 180, 30d supply, fill #0

## 2020-12-08 NOTE — Unmapped (Signed)
[facility] Refill Coordination Note    Specialty Medication(s) to be Shipped:   Transplant: Myfortic 180mg  and tacrolimus 1mg     Other medication(s) to be shipped: No additional medications requested for fill at this time     David Ellis, DOB: 1946-05-31  Phone: [phone] (home)       All above HIPAA information was verified with patient.     Was a Nurse, learning disability used for this call? No    Completed refill call assessment today to schedule patient's medication shipment from the [facility] [phone]).  All relevant notes have been reviewed.     Specialty medication(s) and dose(s) confirmed: Regimen is correct and unchanged.   Changes to medications: Jerl reports no changes at this time.  Changes to insurance: No  New side effects reported not previously addressed with a pharmacist or physician: None reported  Questions for the pharmacist: No    Confirmed patient received a Conservation officer, historic buildings and a Surveyor, mining with first shipment. The patient will receive a drug information handout for each medication shipped and additional FDA Medication Guides as required.       DISEASE/MEDICATION-SPECIFIC INFORMATION        N/A    SPECIALTY MEDICATION ADHERENCE     Medication Adherence    Patient reported X missed doses in the last month: 0  Specialty Medication: Myfortic 180mg   Patient is on additional specialty medications: Yes  Additional Specialty Medications: Tacrolimus 1mg   Patient Reported Additional Medication X Missed Doses in the Last Month: 0  Patient is on more than two specialty medications: No  Adherence tools used: patient uses a pill box to manage medications  Support network for adherence: family member              Were doses missed due to medication being on hold? No    Myfortic 180 mg: 13 days of medicine on hand   Tacrolimus 1 mg: 13 days of medicine on hand       REFERRAL TO PHARMACIST     Referral to the pharmacist: Not needed      SHIPPING Shipping address confirmed in Epic.     Delivery Scheduled: Yes, Expected medication delivery date: 12/19/20.     Medication will be delivered via Same Day Courier to the prescription address in Epic WAM.    [name]   [facility] Specialty Pharmacist

## 2020-12-09 NOTE — Unmapped (Signed)
Assessment and Plan:   David Ellis is a 75 y.o. 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    1. Atrial fibrillation  Overall doing very well.  Zio patch 02/2019 without recurrent atrial fibrillation.  Electrocardiogram today shows sinus rhythm with ventricular rate 70 bpm.  Plan to continue metoprolol at current dose.  Previously reviewed anticoagulation options and he has preferred to stay on warfarin.  Currently tolerating this well, continue current therapy.  INR managed through Dr. Dellis Filbert.    2. Hypertension  Initial blood pressure elevated, follow-up is better.  Readings traditionally well controlled.  Reviewed labs 11/2020.  CBC and BMP stable.  Continue current regimen.    3. Hyperlipidemia  Reviewed recent lipids 09/2020.  Non-HDL has gone up slightly above goal.  Would ideally target less than 100.  However, previous readings have been at goal.  For now, plan to continue current dose of pravastatin.  However, should it remain greater than 100 moving forward, would have low threshold to uptitrate.    4.  Edema  Symptoms are stable. TTE 3/17 with normal ejection fraction.  Reviewed BMP last month with stable electrolytes and renal function.  Continue current regimen with furosemide.      Vevelyn Francois, MD  [facility] Internal [facility]--Cardiology  Office [phone]  Office [phone]      Subjective:   PCP: [name], MD  Patient: David Ellis  DOB: 04-13-1946    Reason for visit:  Afib, HTN, RV failure  HPI: David Ellis is a 75 y.o. 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  Since his last visit, he overall reports feeling very well from the cardiac standpoint.  Denies any chest pain, shortness of breath, or palpitations.  He does report occasional dizziness when his blood sugar gets too low.  Reports swelling has been well controlled.  Biggest problem recently has been problems with balance.  He does not feel as steady as he used to.  He denies any other pertinent complaints today.    ______________________________________________________________________  Pertinent Medical History, Cardiovascular History & Procedures:    Pertinent PMH:   ?? AFib  ?? Typical atrial flutter s/p ablation 06/2011  ?? DM  ?? 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 ESRD s/p transplant  ?? HTN  ?? OSA on CPAP  ?? Skin CA  ?? PE s/p catheter directed lytics 05/2013  ?? 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 Wedge defects in left lung without any thrombus visualized, could represent chronic changes or small subsegmental PEs.    Risk Factors: HTN, DM     Cath / PCI:   ?? None    CV Surgery:   ?? None    EP Procedures and Devices:   ?? None    Non-Invasive Evaluation(s):   ?? Ziopatch 9/20: Predominantly sinus rhythm with rates 57 to 100 bpm, average heart rate 75 bpm.  71 episodes of SVT, longest lasting 17 beats at 91 bpm.  Rare PVCs, brief idioventricular rhythm noted.  No wide-complex tachycardia 2.5% PVC burden.  ?? TTE 3/17: Normal EF, LVH, grade 1 diastolic  dysfunction  ?? Ziopatch 2/16: Rare SVE and VE. 29 episodes of SVT, longest episode lasting 20 beats. No atrial fibrillation identified.  ?? TTE from 06/30/13: Normal EF 55-60%, mildly dilated RV, mild RV dysfunction.  ?? Nuclear stress test 07/2011:  ?? Normal myocardial perfusion study  ?? No evidence for significant ischemia or scar EF 62% and RV is mildly dilated.  ?? Minimal coronary calcification is noted involving the LAD and RCA    ______________________________________________________________________    Other past medical history, social history, family history, medications, allergies and problem list reviewed in the medical record.    Current cardiac medications include:   ?? Metoprolol 100 mg twice daily  ?? 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 Furosemide 40 mg once daily with additional dose as needed  ?? Warfarin  ?? Pravastatin 20 mg daily  ?? Losartan 25 mg daily      Objective:     BP 150/86  - Pulse 72  - Wt (!) 114.3 kg (252 lb)  - SpO2 96%  - BMI 33.25 kg/m??     PHYSICAL EXAMINATION:   GENERAL:  Alert, NAD  ENT: Wearing a mask  CARDIOVASCULAR:  Regular rate and rhythm, soft S1/S2, no murmurs, rubs, or gallops.  1+ bilateral lower extremity edema.  RESPIRATORY:  Clear to auscultation bilaterally.  No wheezes, crackles, or rhonchi. Normal work of breathing.      ______________________________________________________________________  EKG 06/2019 shows normal sinus rhythm, left axis deviation, first-degree AV block, poor wave progression    Recent CV pertinent labs:  Lab Results   Component Value Date    LDL Calculated 68 09/20/2020    LDL Calculated 74 04/21/2015    Non-HDL Cholesterol 106 09/20/2020    HDL 37 (L) 09/20/2020    HDL 36 (L) 04/21/2015    INR, POC 3.09 (A) 05/19/2019    INR 2.43 11/24/2020    INR 1.9 (A) 06/02/2019    PRO-BNP 375 (H) 06/29/2014    Creatinine 1.37 (H) 11/24/2020    Creatinine 1.7 (H) 07/09/2017    Creatinine 1.36 08/09/2014    Potassium 5.5 (H) 11/24/2020    Potassium 4.6 07/09/2017    BUN 22 11/24/2020    BUN 49.00 (H) 07/09/2017    TSH 2.202 07/02/2017    TSH 1.20 07/02/2011       Chemistry        Component Value Date/Time    NA 140 11/24/2020 0812    NA 139 07/09/2017 1358    K 5.5 (H) 11/24/2020 0812    K 4.6 07/09/2017 1358    CL 105 11/24/2020 0812    CL 104 07/09/2017 1358    CO2 29.3 11/24/2020 0812    CO2 24.2 07/09/2017 1358    BUN 22 11/24/2020 0812    BUN 49.00 (H) 07/09/2017 1358    CREATININE 1.37 (H) 11/24/2020 0812    CREATININE 1.7 (H) 07/09/2017 1358    GLU 296 (H) 11/24/2020 0812        Component Value Date/Time    CALCIUM 9.3 11/24/2020 0812    CALCIUM 8.9 07/09/2017 1358    ALKPHOS 101 09/20/2020 0857    ALKPHOS 91 04/21/2015 0858    AST 26 09/20/2020 0857    AST 24 04/21/2015 0858    ALT 25 09/20/2020 0857    ALT 22 04/21/2015 0858    BILITOT 1.0 09/20/2020 0857    BILITOT 0.5 04/21/2015 7846

## 2020-12-19 MED FILL — TACROLIMUS 1 MG CAPSULE, IMMEDIATE-RELEASE: ORAL | 30 days supply | Qty: 150 | Fill #7

## 2020-12-26 ENCOUNTER — Ambulatory Visit: Admit: 2020-12-26 | Discharge: 2020-12-27 | Payer: MEDICARE

## 2021-01-05 DIAGNOSIS — E1165 Type 2 diabetes mellitus with hyperglycemia: Principal | ICD-10-CM

## 2021-01-05 DIAGNOSIS — D849 Immunodeficiency, unspecified: Principal | ICD-10-CM

## 2021-01-05 DIAGNOSIS — I4891 Unspecified atrial fibrillation: Principal | ICD-10-CM

## 2021-01-05 DIAGNOSIS — E1129 Type 2 diabetes mellitus with other diabetic kidney complication: Principal | ICD-10-CM

## 2021-01-05 NOTE — Unmapped (Signed)
Addended by: Joyice Faster on: 01/05/2021 09:11 AM     Modules accepted: Orders

## 2021-01-05 NOTE — Unmapped (Signed)
PCP:  David Addison, MD    01/10/21    ASSESSMENT/PLAN: [name] is a 75 y.o. patient s/p LURD kidney transplant 2013 complicated by a massive PE in 2014, osteomyelitis requiring lateral foot amputation in 2016, CVA in 2017 and deteriorating vision and mobility, though has been well over the last year or two.    -- S/P LURD kidney transplant 2013 with baseline creatinine ~1.6-2.0,  His electrolytes are WNL. Tac troughs running a little lower, with most recent at 3.7. Will reach out to [place] to see if he wants to bump the prograf to 3mg /3mg .  Currently on Prograf 3 mg qAM, 2 mg qPM.     --Stage A2G3a CKD:  D/t worsening albuminuria continue losartan 25 mg daily. Below his baseline creatinine on 12/26/20.    -- HTN -- Well controlled - BP in clinic today 116/61. Continue metoprolol, losartan and lasix.    -- T2DM. HbA1c 8.8 on 08/08/20. Due for recheck. Pt was last seen by his endocrinologist on 01/07/20. History of proliferative retinopathy s/p PPV/endolaser treatments, last evaluated by ophthalmology in 01/2020.     -- ASHD/CVA history. Continue pravastatin and ASA. Placed a order for PT-INR test.    -- A-Fib. Continue warfarin. INR f/b Dr. Dellis Filbert. Discussed possibility of transitioning to DOAC, but he feels since he has to get blood drawn monthly anyways, he may as well stay on the warfarin.    -- HM: Had his covid vaccinations and is UTD on pneumonia and shingrix. Was due for colonoscopy but reportedly GI is leery of stopping anticoagulation so are following cologuard test results. No FH colon CA. Continue biannual follow-up with dermatology re: recurrent basal cell CA. Given high risk for severe COVID19 infection, he received Evusheld injections on 06/28/20 and 08/26/20.    -- High falls risk: No recent falls. Has walker and cane but still very unstable with this and not getting out of the house because of this. I recommended he start physical therapy 2 days a week to work on stability to play golf again. I also recommended he start walking in the mall 2 days per week to increase activity levels.    [name] will see me in 3 months.   ______________________________________________________________________________________________________    HPI: [name] is a 75 y.o. man s/p LURD kidney transplant (from his wife, David Ellis) 08/21/2011 and B/L creatinine ~1.4-1.7 who returns for f/u. 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     He has had multiple complications since his transplant including a large, provoked (long car ride) pulmonary embolus requiring thrombolysis (Dec 2014), osteomyelitis of his left foot requiring 4th/5th toe amputation (2016), recurrent basal cell CA, CVA s/p thombolysis (07/2015). He has significant PAD and neuropathy and has no sensation on either leg below his knees. He also has severe retinopathy and his vision is quite poor so no longer drives.     Interval history:   No acute hospitalizations or illnesses since last visit on 06/28/2020.     Patient presents today feeling overall well. He and his wife continue to wear a mask while going out and at church. He notes he wants to start playing golf again, but he struggles to bend over with the golf club  without almost falling over. He met with physical therapy and they recommended he and his wife assess their house for fall risk, but he has not yet started physical therapy. He reports he needs a repeat INR test.      ROS: As per HPI. The remainder of the 10 system review is negative.    PAST MEDICAL HISTORY:  Past Medical History:   Diagnosis Date   ??? Atrial flutter (CMS-HCC)    ??? Diabetes mellitus (CMS-HCC)    ??? Diabetic nephropathy (CMS-HCC)    ??? Diabetic retinopathy (CMS-HCC)    ??? Fractures    ??? Ganglion cyst    ??? Hand injury    ??? Heart disease    ??? Hyperlipidemia    ??? Hypertension    ??? Joint pain    ??? Osteomyelitis (CMS-HCC) June 2016   ??? Pulmonary embolism (CMS-HCC) Dec 2015   ??? 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 Stroke (CMS-HCC)    ??? Tear of meniscus of knee    ??? Transplanted kidney 08/21/2011       ALLERGIES  Levofloxacin; Lidocaine; Penicillins; Egg derived; Enalapril; Epinephrine; Grass pollen-bermuda, standard; Lisinopril; and Mepivacaine hcl    MEDICATIONS:  Current Outpatient Medications   Medication Sig Dispense Refill   ??? cholecalciferol, vitamin D3-50 mcg, 2,000 unit,, 50 mcg (2,000 unit) tablet Take 2,000 Units by mouth daily.      ??? furosemide (LASIX) 40 MG tablet Take 1-2 tablets daily as needed for edema. 180 tablet 3   ??? gabapentin (NEURONTIN) 300 MG capsule Take 1 capsule (300 mg total) by mouth Two (2) times a day. 180 capsule 3   ??? HUMULIN R U-500, CONC, KWIKPEN 500 unit/mL (3 mL) CONCENTRATED injection DIAL TO MAX 100 UNITS BEFORE BREAKFAST, LUNCH AND DINNER. INJECT 30 MINUTES BEFORE MEALS. MAX OF 300 UNITS DAILY. (Patient taking differently: Per sliding scale) 18 mL 11   ??? losartan (COZAAR) 25 MG tablet TAKE ONE TABLET BY MOUTH EVERY DAY 90 tablet 3   ??? magnesium chloride (SLOW_MAG) 64 mg TbEC Take 128 mg by mouth three (3) times a day (at 6am, noon and 6pm). 9 am 1 pm and 9 pm     ??? metoprolol succinate (TOPROL-XL) 100 MG 24 hr tablet TAKE ONE TABLET TWICE DAILY 180 tablet 1   ??? MYFORTIC 180 mg EC tablet Take 3 tablets (540 mg total) by mouth two (2) times a day. 540 tablet 3   ??? pravastatin (PRAVACHOL) 20 MG tablet TAKE ONE TABLET BY MOUTH EVERY DAY 90 tablet 3   ??? tacrolimus (PROGRAF) 1 MG capsule Take 3 capsules (3 mg total) by mouth in the morning AND 2 capsules (2 mg total) in the evening. 450 capsule 3   ??? TRULICITY 1.5 mg/0.5 mL PnIj INJECT 1.5MG (0.5ML) SUBCUTANEOUSLY ONCE A WEEK 2 mL 11   ??? warfarin (COUMADIN) 2.5 MG tablet Take 1 tablet (2.5 mg total) by mouth daily. (Patient taking differently: Take 2.5 mg by mouth once a week. On Mondays 2.5mg  PO) 30 tablet 11   ??? warfarin (JANTOVEN) 5 MG tablet Take 1 tablet (5 mg total) by mouth daily with evening meal. TAKE 6 DAYS A WEEK - Tuesday through Sunday 90 tablet 3   ??? aspirin (ECOTRIN) 81 MG tablet Take 81 mg by mouth daily as needed (when flying). (Patient not taking: Reported on 01/10/2021)     ??? blood-glucose meter kit Use as instructed.  1 each 0   ??? flash glucose sensor (FREESTYLE LIBRE 14 DAY SENSOR) kit by Other route every fourteen (14) days. 2 each 11   ??? miscellaneous medical supply Misc 1 application by Miscellaneous route daily as needed. Lightweight wheelchair. 1 each 0   ??? miscellaneous medical supply Misc 1 application by Miscellaneous route nightly. Recommend CPAP 14 cm H2O with EPR 3 and heated humidifier for nasal dryness, mask: ResMed Activa Lt nasal mask (Large). 1 each 0   ??? multivitamin capsule Take 1 capsule by mouth daily.     ??? pen needle, diabetic (BD ULTRA-FINE NANO PEN NEEDLE) 32 gauge x 5/32 (4 mm) Ndle 1 each by Other route Three (3) times a day before meals. 300 each 3     No current facility-administered medications for this visit.     Facility-Administered Medications Ordered in Other Visits   Medication Dose Route Frequency Provider Last Rate Last Admin   ??? bevacizumab    PRN (once a day) Melvyn Novas, MD   2.5 mg at 01/28/17 0750     PHYSICAL EXAM:  Vitals:    01/10/21 1038   BP: 116/61   Pulse: 80   Temp: 36.4 ??C (97.5 ??F)     CONSTITUTIONAL: Alert,well appearing, no distress  HEENT: Moist mucous membranes, oropharynx clear without erythema or exudate  NECK: Supple, no lymphadenopathy  CARDIOVASCULAR: Regular, normal S1/S2 heart sounds, no murmurs, no rubs.   PULM: Clear to auscultation bilaterally  GASTROINTESTINAL: Soft, active bowel sounds, nontender  EXTREMITIES: No lower extremity edema bilaterally, dorsalis pedis pulses 2+ bilaterally   SKIN: No rashes or lesions  NEUROLOGIC: No focal motor or sensory deficits      MEDICAL DECISION MAKING  Lab Results   Component Value Date    NA 140 12/26/2020    K 4.5 12/26/2020    CO2 23.5 12/26/2020    BUN 22 12/26/2020    CREATININE 1.39 (H) 12/26/2020    CALCIUM 9.3 12/26/2020    PHOS 3.3 12/26/2020     Lab Results   Component Value Date    WBC 7.0 12/26/2020    HGB 15.2 12/26/2020    PLT 192 12/26/2020     Lab Results   Component Value Date    TACROLIMUS 3.7 12/26/2020     Lab Results   Component Value Date    A1C 8.8 (H) 08/08/2020       Scribe's Attestation: Trey Sailors, MD obtained and performed the history, physical exam and medical decision making elements that were entered into the chart. Signed by Lauralee Evener, Scribe, on January 10, 2021 11:26 AM     Attending Statement: Documentation assistance provided by the Scribe. I was present during the time the encounter was recorded. The information recorded by the Scribe was done at my direction and has been reviewed and validated by me

## 2021-01-09 NOTE — Unmapped (Signed)
[facility] Clinical Assessment & Refill Coordination Note    David Ellis, DOB: December 01, 1945  Phone: [phone] (home)     All above HIPAA information was verified with patient.     Was a Nurse, learning disability used for this call? No    Specialty Medication(s):   Transplant: Myfortic 180mg  and tacrolimus 1mg      Current Outpatient Medications   Medication Sig Dispense Refill   ??? aspirin (ECOTRIN) 81 MG tablet Take 81 mg by mouth daily as needed (when flying).     ??? blood-glucose meter kit Use as instructed. 1 each 0   ??? cholecalciferol, vitamin D3-50 mcg, 2,000 unit,, 50 mcg (2,000 unit) tablet Take 2,000 Units by mouth daily.      ??? flash glucose sensor (FREESTYLE LIBRE 14 DAY SENSOR) kit by Other route every fourteen (14) days. 2 each 11   ??? furosemide (LASIX) 40 MG tablet Take 1-2 tablets daily as needed for edema. 180 tablet 3   ??? gabapentin (NEURONTIN) 300 MG capsule Take 1 capsule (300 mg total) by mouth Two (2) times a day. 180 capsule 3   ??? HUMULIN R U-500, CONC, KWIKPEN 500 unit/mL (3 mL) CONCENTRATED injection DIAL TO MAX 100 UNITS BEFORE BREAKFAST, LUNCH AND DINNER. INJECT 30 MINUTES BEFORE MEALS. MAX OF 300 UNITS DAILY. 18 mL 11   ??? losartan (COZAAR) 25 MG tablet TAKE ONE TABLET BY MOUTH EVERY DAY 90 tablet 3   ??? magnesium chloride (SLOW_MAG) 64 mg TbEC Take 128 mg by mouth three (3) times a day (at 6am, noon and 6pm). 9 am 1 pm and 9 pm     ??? metoprolol succinate (TOPROL-XL) 100 MG 24 hr tablet TAKE ONE TABLET TWICE DAILY 180 tablet 1   ??? miscellaneous medical supply Misc 1 application by Miscellaneous route daily as needed. Lightweight wheelchair. 1 each 0   ??? miscellaneous medical supply Misc 1 application by Miscellaneous route nightly. Recommend CPAP 14 cm H2O with EPR 3 and heated humidifier for nasal dryness, mask: ResMed Activa Lt nasal mask (Large). 1 each 0   ??? multivitamin capsule Take 1 capsule by mouth daily.     ??? MYFORTIC 180 mg EC tablet Take 3 tablets (540 mg total) by mouth two (2) times a day. 540 tablet 3   ??? pen needle, diabetic (BD ULTRA-FINE NANO PEN NEEDLE) 32 gauge x 5/32 (4 mm) Ndle 1 each by Other route Three (3) times a day before meals. 300 each 3   ??? pravastatin (PRAVACHOL) 20 MG tablet TAKE ONE TABLET BY MOUTH EVERY DAY 90 tablet 3   ??? tacrolimus (PROGRAF) 1 MG capsule Take 3 capsules (3 mg total) by mouth in the morning AND 2 capsules (2 mg total) in the evening. 450 capsule 3   ??? TRULICITY 1.5 mg/0.5 mL PnIj INJECT 1.5MG (0.5ML) SUBCUTANEOUSLY ONCE A WEEK 2 mL 11   ??? warfarin (COUMADIN) 2.5 MG tablet Take 1 tablet (2.5 mg total) by mouth daily. (Patient taking differently: Take 2.5 mg by mouth once a week. On Mondays 2.5mg  PO) 30 tablet 11   ??? warfarin (JANTOVEN) 5 MG tablet Take 1 tablet (5 mg total) by mouth daily with evening meal. TAKE 6 DAYS A WEEK - Tuesday through Sunday 90 tablet 3     No current facility-administered medications for this visit.     Facility-Administered Medications Ordered in Other Visits   Medication Dose Route Frequency Provider Last Rate Last Admin   ??? bevacizumab  PRN (once a day) Melvyn Novas, MD   2.5 mg at 01/28/17 0750        Changes to medications: Vuk reports no changes at this time.    Allergies   Allergen Reactions   ??? Levofloxacin Other (See Comments)     Other reaction(s): Unknown  Unknown   ??? Lidocaine Other (See Comments)     I don't remember States is fine with Bupivicaine   ??? Penicillins      As child  Other reaction(s): UNKNOWN.   (UPDATE: Tolerated amp/sulbactam without side effects during 10/2014 hospitalization)   ??? Egg Derived Rash   ??? Enalapril Cough   ??? Epinephrine Palpitations   ??? Grass Pollen-[place], Standard Itching   ??? Lisinopril Rash   ??? Mepivacaine Hcl Palpitations       Changes to allergies: No    SPECIALTY MEDICATION ADHERENCE     Myfortic 180 mg: 10 days of medicine on hand   Tacrolimus 1 mg: 10 days of medicine on hand       Medication Adherence    Patient reported X missed doses in the last month: 0  Specialty Medication: Myfortic 180mg   Patient is on additional specialty medications: Yes  Additional Specialty Medications: Tacrolimus 1mg   Patient Reported Additional Medication X Missed Doses in the Last Month: 0  Patient is on more than two specialty medications: No  Adherence tools used: patient uses a pill box to manage medications  Support network for adherence: family member          Specialty medication(s) dose(s) confirmed: Regimen is correct and unchanged.     Are there any concerns with adherence? No    Adherence counseling provided? Not needed    CLINICAL MANAGEMENT AND INTERVENTION      Clinical Benefit Assessment:    Do you feel the medicine is effective or helping your condition? Yes    Clinical Benefit counseling provided? Not needed    Adverse Effects Assessment:    Are you experiencing any side effects? No    Are you experiencing difficulty administering your medicine? No    Quality of Life Assessment:         How many days over the past month did your kidney transplant  keep you from your normal activities? For example, brushing your teeth or getting up in the morning. 0    Have you discussed this with your provider? Not needed    Acute Infection Status:    Acute infections noted within Epic:  MDR Acinetobacter, MRSA  Patient reported infection: None    Therapy Appropriateness:    Is therapy appropriate? Yes, therapy is appropriate and should be continued    DISEASE/MEDICATION-SPECIFIC INFORMATION      N/A    PATIENT SPECIFIC NEEDS     - Does the patient have any physical, cognitive, or cultural barriers? No    - Is the patient high risk? Yes, patient is taking a REMS drug. Medication is dispensed in compliance with REMS program    - Does the patient require a Care Management Plan? No     - Does the patient require physician intervention or other additional services (i.e. nutrition, smoking cessation, social work)? No      SHIPPING     Specialty Medication(s) to be Shipped: Transplant: Myfortic 180mg  and tacrolimus 1mg     Other medication(s) to be shipped: No additional medications requested for fill at this time     Changes to insurance: No  Delivery Scheduled: Yes, Expected medication delivery date: 01/17/21.     Medication will be delivered via Same Day Courier to the confirmed prescription address in [facility]    The patient will receive a drug information handout for each medication shipped and additional FDA Medication Guides as required.  Verified that patient has previously received a Conservation officer, historic buildings and a Surveyor, mining.    The patient or caregiver noted above participated in the development of this care plan and knows that they can request review of or adjustments to the care plan at any time.      All of the patient's questions and concerns have been addressed.    [name]   [facility] Specialty Pharmacist

## 2021-01-10 ENCOUNTER — Ambulatory Visit: Admit: 2021-01-10 | Discharge: 2021-01-11 | Payer: MEDICARE

## 2021-01-10 DIAGNOSIS — Z5181 Encounter for therapeutic drug level monitoring: Principal | ICD-10-CM

## 2021-01-10 LAB — PROTIME-INR
INR: 3.02
PROTIME: 35 s — ABNORMAL HIGH (ref 10.3–13.4)

## 2021-01-10 NOTE — Unmapped (Unsigned)
Patient labs drawn in room prior to leaving Nephrology clinic

## 2021-01-11 NOTE — Unmapped (Signed)
[place] Assessment of Medications Program (CAMP)  Comprehensive Medication Management RECRUITMENT SUMMARY NOTE     Patient was identified for CAMP Services.  A letter has been sent explaining program services.      Education officer, community  Clinical Operations Specialist/CPhT  [place] [facility] of Medication Program (CAMP)  [phone]

## 2021-01-17 MED FILL — TACROLIMUS 1 MG CAPSULE, IMMEDIATE-RELEASE: ORAL | 30 days supply | Qty: 150 | Fill #8

## 2021-01-17 MED FILL — MYFORTIC 180 MG TABLET,DELAYED RELEASE: ORAL | 30 days supply | Qty: 180 | Fill #1

## 2021-01-19 ENCOUNTER — Ambulatory Visit: Admit: 2021-01-19 | Discharge: 2021-01-20 | Payer: MEDICARE

## 2021-01-19 DIAGNOSIS — D849 Immunodeficiency, unspecified: Principal | ICD-10-CM

## 2021-01-19 DIAGNOSIS — E1165 Type 2 diabetes mellitus with hyperglycemia: Principal | ICD-10-CM

## 2021-01-19 DIAGNOSIS — I2699 Other pulmonary embolism without acute cor pulmonale: Principal | ICD-10-CM

## 2021-01-19 DIAGNOSIS — G4733 Obstructive sleep apnea (adult) (pediatric): Principal | ICD-10-CM

## 2021-01-19 DIAGNOSIS — Z9989 Dependence on other enabling machines and devices: Principal | ICD-10-CM

## 2021-01-19 DIAGNOSIS — I4891 Unspecified atrial fibrillation: Principal | ICD-10-CM

## 2021-01-19 DIAGNOSIS — R799 Abnormal finding of blood chemistry, unspecified: Principal | ICD-10-CM

## 2021-01-19 DIAGNOSIS — E1129 Type 2 diabetes mellitus with other diabetic kidney complication: Principal | ICD-10-CM

## 2021-01-19 LAB — HEMOGLOBIN A1C
ESTIMATED AVERAGE GLUCOSE: 214 mg/dL
HEMOGLOBIN A1C: 9.1 % — ABNORMAL HIGH (ref 4.8–5.6)

## 2021-01-19 LAB — CBC W/ AUTO DIFF
BASOPHILS ABSOLUTE COUNT: 0 10*9/L (ref 0.0–0.1)
BASOPHILS RELATIVE PERCENT: 0.5 %
EOSINOPHILS ABSOLUTE COUNT: 0.1 10*9/L (ref 0.0–0.5)
EOSINOPHILS RELATIVE PERCENT: 0.6 %
HEMATOCRIT: 43.9 % (ref 39.0–48.0)
HEMOGLOBIN: 15.1 g/dL (ref 12.9–16.5)
LYMPHOCYTES ABSOLUTE COUNT: 1.2 10*9/L (ref 1.1–3.6)
LYMPHOCYTES RELATIVE PERCENT: 13.6 %
MEAN CORPUSCULAR HEMOGLOBIN CONC: 34.3 g/dL (ref 32.0–36.0)
MEAN CORPUSCULAR HEMOGLOBIN: 28.9 pg (ref 25.9–32.4)
MEAN CORPUSCULAR VOLUME: 84.1 fL (ref 77.6–95.7)
MEAN PLATELET VOLUME: 8.3 fL (ref 6.8–10.7)
MONOCYTES ABSOLUTE COUNT: 0.6 10*9/L (ref 0.3–0.8)
MONOCYTES RELATIVE PERCENT: 7.1 %
NEUTROPHILS ABSOLUTE COUNT: 6.8 10*9/L (ref 1.8–7.8)
NEUTROPHILS RELATIVE PERCENT: 78.2 %
PLATELET COUNT: 194 10*9/L (ref 150–450)
RED BLOOD CELL COUNT: 5.22 10*12/L (ref 4.26–5.60)
RED CELL DISTRIBUTION WIDTH: 14 % (ref 12.2–15.2)
WBC ADJUSTED: 8.7 10*9/L (ref 3.6–11.2)

## 2021-01-19 LAB — COMPREHENSIVE METABOLIC PANEL
ALBUMIN: 3.5 g/dL (ref 3.4–5.0)
ALKALINE PHOSPHATASE: 82 U/L (ref 46–116)
ALT (SGPT): 19 U/L (ref 10–49)
ANION GAP: 4 mmol/L — ABNORMAL LOW (ref 5–14)
AST (SGOT): 28 U/L (ref <=34)
BILIRUBIN TOTAL: 0.4 mg/dL (ref 0.3–1.2)
BLOOD UREA NITROGEN: 21 mg/dL (ref 9–23)
BUN / CREAT RATIO: 16
CALCIUM: 9.2 mg/dL (ref 8.7–10.4)
CHLORIDE: 105 mmol/L (ref 98–107)
CO2: 28 mmol/L (ref 20.0–31.0)
CREATININE: 1.28 mg/dL — ABNORMAL HIGH
EGFR CKD-EPI (2021) MALE: 59 mL/min/{1.73_m2} — ABNORMAL LOW (ref >=60)
GLUCOSE RANDOM: 227 mg/dL — ABNORMAL HIGH (ref 70–179)
POTASSIUM: 5 mmol/L — ABNORMAL HIGH (ref 3.4–4.8)
PROTEIN TOTAL: 6.7 g/dL (ref 5.7–8.2)
SODIUM: 137 mmol/L (ref 135–145)

## 2021-01-19 LAB — LIPID PANEL
CHOLESTEROL/HDL RATIO SCREEN: 4.8 — ABNORMAL HIGH (ref 1.0–4.5)
CHOLESTEROL: 143 mg/dL (ref <=200)
HDL CHOLESTEROL: 30 mg/dL — ABNORMAL LOW (ref 40–60)
LDL CHOLESTEROL CALCULATED: 49 mg/dL (ref 40–99)
NON-HDL CHOLESTEROL: 113 mg/dL (ref 70–130)
TRIGLYCERIDES: 318 mg/dL — ABNORMAL HIGH (ref 0–150)
VLDL CHOLESTEROL CAL: 63.6 mg/dL — ABNORMAL HIGH (ref 12–42)

## 2021-01-19 LAB — PROTIME-INR
INR: 2.35
PROTIME: 27.3 s — ABNORMAL HIGH (ref 10.3–13.4)

## 2021-01-19 NOTE — Unmapped (Signed)
Problem List Items Addressed This Visit          High    Atrial fibrillation (CMS-HCC)     Remains on coumadin forever.    None further since the ablation.             Relevant Orders    PT-INR    OSA on CPAP     Re: the osa.    Machine and mask have broken.    He reports compiance with the machine and mask prior to them breaking.  He reports considerable clinical benefit from using these at night.  Script written and handed to him with sleep study handed to him.             Other pulmonary embolism without acute cor pulmonale (CMS-HCC)      REmains on coumadin for ever.  INR a hair high last time; repeat today.             Type II diabetes mellitus with renal manifestations, uncontrolled (CMS-HCC)     Will see Dr. Tiburcio Pea in a week.  Check A1C today.                     Other Visit Diagnoses       OSA (obstructive sleep apnea)    -  Primary    Relevant Orders    CPAP/BiPAP (DME)    Comprehensive Metabolic Panel    Lipid Panel    CBC w/ Differential    Hemoglobin A1c    Abnormal finding of blood chemistry, unspecified         Relevant Orders    Lipid Panel    Hemoglobin A1c

## 2021-01-19 NOTE — Unmapped (Signed)
Addended by: Violeta Gelinas on: 01/19/2021 03:00 PM     Modules accepted: Orders

## 2021-01-19 NOTE — Unmapped (Signed)
Addended by: Violeta Gelinas on: 01/19/2021 03:07 PM     Modules accepted: Orders

## 2021-01-19 NOTE — Unmapped (Signed)
Will see Dr. Tiburcio Pea in a week.  Check A1C today.

## 2021-01-19 NOTE — Unmapped (Signed)
Remains on coumadin forever.    None further since the ablation.

## 2021-01-19 NOTE — Unmapped (Signed)
REmains on coumadin for ever.  INR a hair high last time; repeat today.

## 2021-01-19 NOTE — Unmapped (Signed)
Re: the osa.    Machine and mask have broken.    He reports compiance with the machine and mask prior to them breaking.  He reports considerable clinical benefit from using these at night.  Script written and handed to him with sleep study handed to him.

## 2021-01-19 NOTE — Unmapped (Signed)
Patient ID: David Ellis is a 75 y.o. male who presents for eval of medical issues.       Assessment/Plan:        Type II diabetes mellitus with renal manifestations, uncontrolled (CMS-HCC)  Will see Dr. Tiburcio Pea in a week.  Check A1C today.        Other pulmonary embolism without acute cor pulmonale (CMS-HCC)   REmains on coumadin for ever.  INR a hair high last time; repeat today.      Atrial fibrillation (CMS-HCC)  Remains on coumadin forever.    None further since the ablation.      OSA on CPAP  Re: the osa.    Machine and mask have broken.    He reports compiance with the machine and mask prior to them breaking.  He reports considerable clinical benefit from using these at night.  Script written and handed to him with sleep study handed to him.        Orders Placed This Encounter   Procedures   ??? CPAP/BiPAP (DME)   ??? Comprehensive Metabolic Panel   ??? Lipid Panel   ??? CBC w/ Differential   ??? Hemoglobin A1c   ??? PT-INR       Medication adherence and barriers to the treatment plan have been addressed. Opportunities to optimize healthy behaviors have been discussed. Patient / caregiver voiced understanding.       No follow-ups on file.     Subjective:     Current Health Status  Patient Active Problem List    Diagnosis Date Noted   ??? Diabetic ulcer of left foot associated with type 2 diabetes mellitus (CMS-HCC) 07/03/2014   ??? OSA on CPAP 02/17/2014   ??? Routine general medical examination at a health care facility 11/17/2013   ??? Atrial fibrillation (CMS-HCC) 07/06/2013   ??? Right ventricular dysfunction 06/30/2013   ??? Other pulmonary embolism without acute cor pulmonale (CMS-HCC) 05/23/2013   ??? Transplanted kidney    ??? Mixed hyperlipidemia 10/30/2011   ??? Type II diabetes mellitus with renal manifestations, uncontrolled (CMS-HCC) 03/26/2011   ??? Essential (primary) hypertension 10/10/2010   ??? Syncope 02/03/2019   ??? Immunosuppressed status (CMS-HCC) 09/02/2018   ??? Class 2 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 01/01/2018   ??? Hypoglycemia associated with type 2 diabetes mellitus (CMS-HCC) 09/03/2016   ??? BCC (basal cell carcinoma), face 07/18/2015   ??? Left knee pain 11/09/2014   ??? 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 Acquired absence of other toe(s), unspecified side (CMS-HCC) 08/10/2014   ??? Type 2 diabetes mellitus with foot ulcer (CODE) (CMS-HCC) 07/03/2014   ??? Diabetic polyneuropathy associated with type 2 diabetes mellitus (CMS-HCC) 03/15/2014   ??? Aftercare following organ transplant 08/27/2013   ??? Edema 06/30/2013   ??? 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 History of kidney transplant 10/30/2011   ??? 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 Diverticulosis of colon 03/09/2011   ??? History of colonic polyps 03/09/2011      Here today for eval of medical issues.    He reports that he has generally been doin gwell.   He is here face to face with his wife present.      Re: OSA, he has been using CPAP well and compliant with it.  It recently broke and the mask has broken.  In Nov 2015, he had a sleep study here with CPAP titration that showed 1) severe sleep apnea  with a AHI 33, and 2) started on CPAP 14 cm water heated humidifier for nasal dryness.  Also has Res Med Lt Nasal Mask Large.  He reports compliance with the CPAP and feels much much betetr with CPAP at night.  He would like a new machine and new supplies.  He has gone to [place] in [place] in the past and would like to continue thsi there.      He reports that he is very happy about the new federal law requiring insuli to be 35 a month.      Allergies   Allergen Reactions   ??? Levofloxacin Other (See Comments)     Other reaction(s): Unknown  Unknown   ??? Lidocaine Other (See Comments)     I don't remember States is fine with Bupivicaine   ??? Penicillins      As child  Other reaction(s): UNKNOWN.   (UPDATE: Tolerated amp/sulbactam without side effects during 10/2014 hospitalization)   ??? Egg Derived Rash   ??? Enalapril Cough   ??? Epinephrine Palpitations   ??? Grass Pollen-[place], Standard Itching   ??? Lisinopril Rash   ??? Mepivacaine Hcl Palpitations       Current Outpatient Medications   Medication Sig Dispense Refill   ??? cholecalciferol, vitamin D3-50 mcg, 2,000 unit,, 50 mcg (2,000 unit) tablet Take 2,000 Units by mouth daily.      ??? furosemide (LASIX) 40 MG tablet Take 1-2 tablets daily as needed for edema. 180 tablet 3   ??? gabapentin (NEURONTIN) 300 MG capsule Take 1 capsule (300 mg total) by mouth Two (2) times a day. 180 capsule 3   ??? HUMULIN R U-500, CONC, KWIKPEN 500 unit/mL (3 mL) CONCENTRATED injection DIAL TO MAX 100 UNITS BEFORE BREAKFAST, LUNCH AND DINNER. INJECT 30 MINUTES BEFORE MEALS. MAX OF 300 UNITS DAILY. (Patient taking differently: Per sliding scale) 18 mL 11   ??? losartan (COZAAR) 25 MG tablet TAKE ONE TABLET BY MOUTH EVERY DAY 90 tablet 3   ??? magnesium chloride (SLOW_MAG) 64 mg TbEC Take 128 mg by mouth three (3) times a day (at 6am, noon and 6pm). (2 tablets) 9am 1 pm and 9 pm     ??? metoprolol succinate (TOPROL-XL) 100 MG 24 hr tablet TAKE ONE TABLET TWICE DAILY 180 tablet 1   ??? miscellaneous medical supply Misc 1 application by Miscellaneous route daily as needed. Lightweight wheelchair. 1 each 0   ??? miscellaneous medical supply Misc 1 application by Miscellaneous route nightly. Recommend CPAP 14 cm H2O with EPR 3 and heated humidifier for nasal dryness, mask: ResMed Activa Lt nasal mask (Large). 1 each 0   ??? multivitamin capsule Take 1 capsule by mouth daily.     ??? MYFORTIC 180 mg EC tablet Take 3 tablets (540 mg total) by mouth two (2) times a day. 540 tablet 3   ??? pen needle, diabetic (BD ULTRA-FINE NANO PEN NEEDLE) 32 gauge x 5/32 (4 mm) Ndle 1 each by Other route Three (3) times a day before meals. 300 each 3   ??? pravastatin (PRAVACHOL) 20 MG tablet TAKE ONE TABLET BY MOUTH EVERY DAY 90 tablet 3   ??? tacrolimus (PROGRAF) 1 MG capsule Take 3 capsules (3 mg total) by mouth in the morning AND 2 capsules (2 mg total) in the evening. 450 capsule 3   ??? TRULICITY 1.5 mg/0.5 mL PnIj INJECT 1.5MG (0.5ML) SUBCUTANEOUSLY ONCE A WEEK 2 mL 11   ??? warfarin (COUMADIN) 2.5 MG tablet Take 1 tablet (2.5  mg total) by mouth daily. (Patient taking differently: Take 2.5 mg by mouth once a week. On Mondays 2.5mg  PO 5 mg QOD) 30 tablet 11   ??? warfarin (JANTOVEN) 5 MG tablet Take 1 tablet (5 mg total) by mouth daily with evening meal. TAKE 6 DAYS A WEEK - Tuesday through Sunday 90 tablet 3   ??? aspirin (ECOTRIN) 81 MG tablet Take 81 mg by mouth daily as needed (when flying). (Patient not taking: No sig reported)     ??? blood-glucose meter kit Use as instructed. 1 each 0   ??? flash glucose sensor (FREESTYLE LIBRE 14 DAY SENSOR) kit by Other route every fourteen (14) days. 2 each 11     No current facility-administered medications for this visit.     Facility-Administered Medications Ordered in Other Visits   Medication Dose Route Frequency Provider Last Rate Last Admin   ??? bevacizumab    PRN (once a day) Melvyn Novas, MD   2.5 mg at 01/28/17 0750       Past Medical History:   Diagnosis Date   ??? Atrial flutter (CMS-HCC)    ??? Diabetes mellitus (CMS-HCC)    ??? Diabetic nephropathy (CMS-HCC)    ??? Diabetic retinopathy (CMS-HCC)    ??? Fractures    ??? Ganglion cyst    ??? Hand injury    ??? Heart disease    ??? Hyperlipidemia    ??? Hypertension    ??? Joint pain    ??? Osteomyelitis (CMS-HCC) June 2016   ??? Pulmonary embolism (CMS-HCC) Dec 2015   ??? 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 Stroke (CMS-HCC)    ??? Tear of meniscus of knee    ??? 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 NEPHRECTOMY TRANSPLANTED ORGAN Right     LURD - spouse received in 2013.   ??? PR AMPUTATION METATARSAL+TOE,SINGLE Left 07/06/2014    Procedure: AMPUTATION, METATARSAL, WITH TOE SINGLE;  Surgeon: Marion Downer, MD;  Location: MAIN [facility];  Service: Vascular   ??? PR AMPUTATION METATARSAL+TOE,SINGLE Left 10/28/2014    Procedure: AMPUTATION, METATARSAL, WITH TOE SINGLE;  Surgeon: Maple Mirza, MD;  Location: MAIN OR [facility];  Service: Vascular   ??? PR VITRECTOMY,PANRETINAL LASER RX Right 11/26/2016    Procedure: VITRECTOMY, MECHANICAL, PARS PLANA APPROACH; WITH ENDOLASER PANRETINAL PHOTOCOAGULATION;  Surgeon: Melvyn Novas, MD;  Location: [facility] OR [facility];  Service: Ophthalmology   ??? PR VITRECTOMY,PANRETINAL LASER RX Left 01/28/2017    Procedure: VITRECTOMY, MECHANICAL, PARS PLANA APPROACH; WITH ENDOLASER PANRETINAL PHOTOCOAGULATION;  Surgeon: Melvyn Novas, MD;  Location: [facility] OR [facility];  Service: Ophthalmology   ??? SKIN BIOPSY         Family History   Problem Relation Age of Onset   ??? Kidney disease Mother    ??? Diabetes Mother    ??? Kidney disease Father    ??? Kidney disease Maternal Grandmother    ??? Diabetes Maternal Grandmother    ??? Glaucoma Neg Hx    ??? Macular degeneration Neg Hx    ??? Strabismus Neg Hx        Social History     Tobacco Use   ??? Smoking status: Never Smoker   ??? Smokeless tobacco: Never Used   Substance Use Topics   ??? Alcohol use: No   ??? Drug use: No  Objective:       Vital Signs  BP 130/72  - Pulse 77  - Resp 18  - Wt (!) 116.1 kg (256 lb)  - SpO2 96%  - BMI 33.78 kg/m??      Exam  Well appearing male in nad.

## 2021-01-20 LAB — TACROLIMUS LEVEL, TIMED: TACROLIMUS BLOOD: 4.5 ng/mL

## 2021-01-24 ENCOUNTER — Ambulatory Visit: Admit: 2021-01-24 | Discharge: 2021-01-24 | Payer: MEDICARE

## 2021-01-24 ENCOUNTER — Ambulatory Visit: Admit: 2021-01-24 | Discharge: 2021-01-24 | Payer: MEDICARE | Attending: "Endocrinology | Primary: "Endocrinology

## 2021-01-24 DIAGNOSIS — H35373 Puckering of macula, bilateral: Principal | ICD-10-CM

## 2021-01-24 DIAGNOSIS — Z961 Presence of intraocular lens: Principal | ICD-10-CM

## 2021-01-24 DIAGNOSIS — Z794 Long term (current) use of insulin: Principal | ICD-10-CM

## 2021-01-24 DIAGNOSIS — H4312 Vitreous hemorrhage, left eye: Principal | ICD-10-CM

## 2021-01-24 DIAGNOSIS — H4311 Vitreous hemorrhage, right eye: Principal | ICD-10-CM

## 2021-01-24 DIAGNOSIS — E113593 Type 2 diabetes mellitus with proliferative diabetic retinopathy without macular edema, bilateral: Principal | ICD-10-CM

## 2021-01-24 DIAGNOSIS — E1129 Type 2 diabetes mellitus with other diabetic kidney complication: Principal | ICD-10-CM

## 2021-01-24 DIAGNOSIS — E1165 Type 2 diabetes mellitus with hyperglycemia: Principal | ICD-10-CM

## 2021-01-24 NOTE — Unmapped (Signed)
ASSESSMENT / PLAN  1. Type II diabetes mellitus with renal manifestations, uncontrolled (CMS-HCC)  2. Encounter for long-term (current) use of insulin (CMS-HCC)  Maintained on u500 insulin 3x daily, GLP1a with assistance from [place] CGM.   After review of 28 day CGM download, following changes recommended:    No adjustments in insulin doses other than give u500 before eating with slower onset of action with uRegular 500..    If you feel Trulicity 1.5mg  is losing any efffect, we can increase to 3mg  weekly dose.     With capping of costs of insulin with medicare, and now on Trulicity, and now with availability of concentrated tresiba u200, I think we we should not rule out changing u500 insulin to a concentrated basal/bolus to achieve tighter glucose control.  Let me know when running out of u500 if you do want to change and we can explore the costs.    MEDICARE:  *The patient is currently using the therapeutic CGM as prescribed.  *The patient has diabetes.  *The patient is insulin-treated with 3 or more daily administrations of insulin (MDII) or a continuous subcutaneous insulin infusion (CSII) pump prior to use of a CGM.   *The patient???s insulin treatment regimen requires frequent adjustments by the patient based on sliding scale.  *Within the last six months, I had an in-person visit with the beneficiary to evaluate their diabetes control and determine that the above criteria are met.   *Every six months I will continue to have an in-person visit with the beneficiary to assess adherence to their CGM regimen and diabetes treatment plan.      Return in about 6 months (around 07/27/2021).     SUBJECTIVE  PCP: [name]  Tplant: Amy Mottle  Optho:  Herma Ard  Wound: Keagy  Podiatry: Dr. Ether Griffins, [facility]   Derm: Davy Pique, [facility]    HPI: David Ellis is a 75 y.o. male who I last saw 07/2020 returns for follow up of T2DM with hx of kidney transplant.    Here w wife Toniann Fail w CDE Lelon Perla 11/2020 who helped him w set up [facility] so it reads to phone.  Maintenance of Freestyle CGM critical.    Also helped adjust u500 insulin: hold lunch time dose if  not eating lunch. He takes as he sits down to eat not prior.   Bfast  19 clicks (95units).  Lunch 4 clicks (20units)  Dinner 8 clicks (40units) -- If glucose < 90 pre dinner, will not take this dose.  If glucoses 90s, only 3 units.   If glucose is < 80 before the meal, reduces dose by 50%.   If glucose is <150 at bedtime, have low carb snack such as handful nuts, piece cheese, spoon of PNbutter.     Maintains Trulicity 1.5mg  (affordable out of doughnut hole and working. Does not qualify for lilly cares. 4 lb loss since 07/2020. He feels appetite controlled, not excessive, sometimes not interested in lunch, so not interested increasing dose).    Returning to gym more recently.         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 Scheduled to see Optho later today.  CKD followed by Nephrology. Has restarted ARB. Not on SGLT2i if I remember correctly due to hx hyperK.  Peripheral neuropathy w hx of amputation.  On Neurontin. Graduated from wound care. Foot exam deferred as documented last week w Dr. Dellis Filbert.   BP tightly  controlled. Denies dizzyness on standing. Maintained on BB and ARB. Prn lasix for edema  Lipids on low dose pravastatin 20.      Long hx of ED and would be interested in trying drug even though he realizes ED could be related to nerve damage or testo levels.   After reviewing potential SE w him, he feels he will hold off for now. May want to review w Cards, too     Endocrine Obtained / Updated Problem List:   1. T2DM dx 1992, insulin roughly 2000:   ?? Proliferative DR s/p hx vitreous hemorrhage and endolaser therapy. Exam 01/2020.  ?? CKD s/p renal transplant  ?? 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 No monofilament 06/2019  ?? Hypoglycemic emergency requiring EMS 03/2011.   ?? Historically, Victoza, Ozempic, Trulicity were not covered or too $$.   ?? Offered to check cpeptide to see if he would qualify for insulin pump under medcare, though he is not interested.    ?? Off MTF since end of 2015/early 2016.   2. 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   3. HTN   4. Nephrolithiasis   5. OSA on cpap   6. Aflutter s/p ablation 06/2011, atrial fibrillation  7. PE    Patient Active Problem List   Diagnosis   ??? Type II diabetes mellitus with renal manifestations, uncontrolled (CMS-HCC)   ??? Essential (primary) hypertension   ??? History of kidney transplant   ??? Mixed hyperlipidemia   ??? 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 Transplanted kidney   ??? Other pulmonary embolism without acute cor pulmonale (CMS-HCC)   ??? Edema   ??? Right ventricular dysfunction   ??? Atrial fibrillation (CMS-HCC)   ??? Aftercare following organ transplant   ??? Diverticulosis of colon   ??? Routine general medical examination at a health care facility   ??? OSA on CPAP   ??? Diabetic polyneuropathy associated with type 2 diabetes mellitus (CMS-HCC)   ??? Diabetic ulcer of left foot associated with type 2 diabetes mellitus (CMS-HCC)   ??? Toe amputation status   ??? Left knee pain   ??? BCC (basal cell carcinoma), face   ??? Acquired absence of other toe(s), unspecified side (CMS-HCC)   ??? History of colonic polyps   ??? Type 2 diabetes mellitus with foot ulcer (CODE) (CMS-HCC)   ??? 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 Hypoglycemia associated with type 2 diabetes mellitus (CMS-HCC)   ??? Class 2 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   ??? Immunosuppressed status (CMS-HCC)   ??? Syncope     Past Medical History:   Diagnosis Date   ??? Atrial flutter (CMS-HCC)    ??? Diabetes mellitus (CMS-HCC)    ??? Diabetic nephropathy (CMS-HCC)    ??? Diabetic retinopathy (CMS-HCC)    ??? Fractures    ??? Ganglion cyst    ??? Hand injury    ??? Heart disease    ??? Hyperlipidemia    ??? Hypertension    ??? Joint pain    ??? Osteomyelitis (CMS-HCC) June 2016   ??? Pulmonary embolism (CMS-HCC) Dec 2015   ??? 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 Stroke (CMS-HCC)    ??? Tear of meniscus of knee    ??? Transplanted kidney 08/21/2011     Medicines:  Current Outpatient Medications   Medication Sig Dispense Refill   ??? aspirin (ECOTRIN) 81 MG tablet Take 81 mg by mouth daily as needed (when flying).     ??? blood-glucose meter kit Use as instructed. 1 each 0   ??? cholecalciferol, vitamin D3-50 mcg, 2,000 unit,, 50 mcg (2,000 unit) tablet Take 2,000 Units by mouth daily.      ??? flash glucose sensor (FREESTYLE LIBRE 14 DAY SENSOR) kit by Other route every fourteen (14) days. 2 each 11   ??? furosemide (LASIX) 40 MG tablet Take 1-2 tablets daily as needed for edema. 180 tablet 3   ??? gabapentin (NEURONTIN) 300 MG capsule Take 1 capsule (300 mg total) by mouth Two (2) times a day. 180 capsule 3   ??? HUMULIN R U-500, CONC, KWIKPEN 500 unit/mL (3 mL) CONCENTRATED injection DIAL TO MAX 100 UNITS BEFORE BREAKFAST, LUNCH AND DINNER. INJECT 30 MINUTES BEFORE MEALS. MAX OF 300 UNITS DAILY. (Patient taking differently: Per sliding scale) 18 mL 11   ??? losartan (COZAAR) 25 MG tablet TAKE ONE TABLET BY MOUTH EVERY DAY 90 tablet 3   ??? magnesium chloride (SLOW_MAG) 64 mg TbEC Take 128 mg by mouth three (3) times a day (at 6am, noon and 6pm). (2 tablets) 9am 1 pm and 9 pm     ??? metoprolol succinate (TOPROL-XL) 100 MG 24 hr tablet TAKE ONE TABLET TWICE DAILY 180 tablet 1   ??? miscellaneous medical supply Misc 1 application by Miscellaneous route daily as needed. Lightweight wheelchair. 1 each 0   ??? miscellaneous medical supply Misc 1 application by Miscellaneous route nightly. Recommend CPAP 14 cm H2O with EPR 3 and heated humidifier for nasal dryness, mask: ResMed Activa Lt nasal mask (Large). 1 each 0   ??? multivitamin capsule Take 1 capsule by mouth daily.     ??? MYFORTIC 180 mg EC tablet Take 3 tablets (540 mg total) by mouth two (2) times a day. 540 tablet 3   ??? pen needle, diabetic (BD ULTRA-FINE NANO PEN NEEDLE) 32 gauge x 5/32 (4 mm) Ndle 1 each by Other route Three (3) times a day before meals. 300 each 3   ??? pravastatin (PRAVACHOL) 20 MG tablet TAKE ONE TABLET BY MOUTH EVERY DAY 90 tablet 3   ??? tacrolimus (PROGRAF) 1 MG capsule Take 3 capsules (3 mg total) by mouth in the morning AND 2 capsules (2 mg total) in the evening. 450 capsule 3   ??? TRULICITY 1.5 mg/0.5 mL PnIj INJECT 1.5MG (0.5ML) SUBCUTANEOUSLY ONCE A WEEK 2 mL 11   ??? warfarin (COUMADIN) 2.5 MG tablet Take 1 tablet (2.5 mg total) by mouth daily. (Patient taking differently: Take 2.5 mg by mouth once a week. On Mondays 2.5mg  PO 5 mg QOD) 30 tablet 11   ??? warfarin (JANTOVEN) 5 MG tablet Take 1 tablet (5 mg total) by mouth daily with evening meal. TAKE 6 DAYS A WEEK - Tuesday through Sunday 90 tablet 3     No current facility-administered medications for this visit.     Facility-Administered Medications Ordered in Other Visits   Medication Dose Route Frequency Provider Last Rate Last Admin   ??? bevacizumab    PRN (once a day) Melvyn Novas, MD   2.5 mg at 01/28/17 4132       Social History: reviewed: his wife was kidney donor. rare etoh. no cig. 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     Other past medical history, medications, allergies and problem list are reviewed in the medical record  REVIEW OF SYSTEMS: Pertinent positives and negatives as in HPI, otherwise, all other systems reviewed are negative.     OBJECTIVE   Vitals:    01/24/21 0947   BP: 128/100   Pulse: 79   Weight: (!) 115.2 kg (254 lb)   Height: 185.4 cm (6' 0.99)     Body mass index is 33.52 kg/m??.    BP Readings from Last 3 Encounters:   01/24/21 128/100   01/19/21 130/72   01/10/21 116/61     Wt Readings from Last 3 Encounters:   01/24/21 (!) 115.2 kg (254 lb)   01/19/21 (!) 116.1 kg (256 lb)   01/10/21 (!) 113.9 kg (251 lb)     GENERAL: well appearing  PSYCH: calm, cooperative with full affect  EYES: eomi, sclera anicteric  CV: sinus rate, regular rhythm  RESP: breathing nonlabored  Foot exam deferred with recent exam last week.    Pertinent labs:  Office Visit on 01/24/2021   Component Date Value Ref Range Status   ??? Glucose, POC 01/24/2021 219 (A) 70 - 179 mg/dL Final   Office Visit on 01/19/2021   Component Date Value Ref Range Status   ??? PT 01/19/2021 27.3 (A) 10.3 - 13.4 sec Final   ??? INR 01/19/2021 2.35   Final   ??? Hemoglobin A1C 01/19/2021 9.1 (A) 4.8 - 5.6 % Final   ??? Estimated Average Glucose 01/19/2021 214  mg/dL Final   ??? Triglycerides 01/19/2021 318 (A) 0 - 150 mg/dL Final   ??? Cholesterol 01/19/2021 143  <=200 mg/dL Final   ??? HDL 29/56/2130 30 (A) 40 - 60 mg/dL Final   ??? LDL Calculated 01/19/2021 49  40 - 99 mg/dL Final    NHLBI Recommended Ranges, LDL Cholesterol, for Adults (20+yrs) (ATPIII), mg/dL  Optimal              <865  Near Optimal        100-129  Borderline High     130-159  High                160-189  Very High            >=190  NHLBI Recommended Ranges, LDL Cholesterol, for Children (2-19 yrs), mg/dL  Desirable            <784  Borderline High     110-129  High                 >=130     ??? VLDL Cholesterol Cal 01/19/2021 63.6 (A) 12 - 42 mg/dL Final   ??? Chol/HDL Ratio 01/19/2021 4.8 (A) 1.0 - 4.5 Final   ??? Non-HDL Cholesterol 01/19/2021 113  70 - 130 mg/dL Final    Non-HDL Cholesterol Recommended Ranges (mg/dL)  Optimal       <696  Near Optimal 130 - 159  Borderline High 160 - 189  High             190 - 219  Very High       >220     ??? FASTING 01/19/2021 No   Final   ??? Sodium 01/19/2021 137  135 - 145 mmol/L Final   ??? Potassium 01/19/2021 5.0 (A) 3.4 - 4.8 mmol/L Final   ??? Chloride 01/19/2021 105  98 - 107 mmol/L Final   ??? CO2 01/19/2021 28.0  20.0 - 31.0 mmol/L Final   ??? Anion Gap 01/19/2021 4 (A) 5 - 14 mmol/L Final   ??? BUN 01/19/2021  21  9 - 23 mg/dL Final   ??? Creatinine 01/19/2021 1.28 (A) 0.60 - 1.10 mg/dL Final   ??? BUN/Creatinine Ratio 01/19/2021 16   Final   ??? eGFR CKD-EPI (2021) Male 01/19/2021 59 (A) >=60 mL/min/1.49m2 Final    eGFR calculated with CKD-EPI 2021 equation in accordance with [employer] and [employer] of Nephrology Task Force recommendations.   ??? Glucose 01/19/2021 227 (A) 70 - 179 mg/dL Final   ??? Calcium 16/03/9603 9.2  8.7 - 10.4 mg/dL Final   ??? Albumin 54/02/8118 3.5  3.4 - 5.0 g/dL Final   ??? Total Protein 01/19/2021 6.7  5.7 - 8.2 g/dL Final   ??? Total Bilirubin 01/19/2021 0.4  0.3 - 1.2 mg/dL Final   ??? AST 14/78/2956 28  <=34 U/L Final   ??? ALT 01/19/2021 19  10 - 49 U/L Final   ??? Alkaline Phosphatase 01/19/2021 82  46 - 116 U/L Final   ??? WBC 01/19/2021 8.7  3.6 - 11.2 10*9/L Final   ??? RBC 01/19/2021 5.22  4.26 - 5.60 10*12/L Final   ??? HGB 01/19/2021 15.1  12.9 - 16.5 g/dL Final   ??? HCT 21/30/8657 43.9  39.0 - 48.0 % Final   ??? MCV 01/19/2021 84.1  77.6 - 95.7 fL Final   ??? MCH 01/19/2021 28.9  25.9 - 32.4 pg Final   ??? MCHC 01/19/2021 34.3  32.0 - 36.0 g/dL Final   ??? RDW 84/69/6295 14.0  12.2 - 15.2 % Final   ??? MPV 01/19/2021 8.3  6.8 - 10.7 fL Final   ??? Platelet 01/19/2021 194  150 - 450 10*9/L Final   ??? Neutrophils % 01/19/2021 78.2  % Final   ??? Lymphocytes % 01/19/2021 13.6  % Final   ??? Monocytes % 01/19/2021 7.1  % Final   ??? Eosinophils % 01/19/2021 0.6  % Final   ??? Basophils % 01/19/2021 0.5  % Final   ??? Absolute Neutrophils 01/19/2021 6.8  1.8 - 7.8 10*9/L Final   ??? Absolute Lymphocytes 01/19/2021 1.2  1.1 - 3.6 10*9/L Final   ??? Absolute Monocytes 01/19/2021 0.6  0.3 - 0.8 10*9/L Final   ??? Absolute Eosinophils 01/19/2021 0.1  0.0 - 0.5 10*9/L Final   ??? Absolute Basophils 01/19/2021 0.0  0.0 - 0.1 10*9/L Final   ??? Tacrolimus, Timed 01/19/2021 4.5  ng/mL Final   Office Visit on 01/10/2021   Component Date Value Ref Range Status   ??? PT 01/10/2021 35.0 (A) 10.3 - 13.4 sec Final   ??? INR 01/10/2021 3.02   Final   Appointment on 12/26/2020   Component Date Value Ref Range Status   ??? Sodium 12/26/2020 140  135 - 145 mmol/L Final   ??? Potassium 12/26/2020 4.5 3.4 - 4.8 mmol/L Final   ??? Chloride 12/26/2020 107  98 - 107 mmol/L Final   ??? CO2 12/26/2020 23.5  20.0 - 31.0 mmol/L Final   ??? Anion Gap 12/26/2020 10  5 - 14 mmol/L Final   ??? BUN 12/26/2020 22  9 - 23 mg/dL Final   ??? Creatinine 12/26/2020 1.39 (A) 0.60 - 1.10 mg/dL Final   ??? BUN/Creatinine Ratio 12/26/2020 16   Final   ??? eGFR CKD-EPI (2021) Male 12/26/2020 54 (A) >=60 mL/min/1.67m2 Final    eGFR calculated with CKD-EPI 2021 equation in accordance with [employer] and [employer] of Nephrology Task Force recommendations.   ??? Glucose 12/26/2020 253 (A) 70 - 179 mg/dL Final   ???  Calcium 12/26/2020 9.3  8.7 - 10.4 mg/dL Final   ??? Magnesium 16/03/9603 2.0  1.6 - 2.6 mg/dL Final   ??? Phosphorus 12/26/2020 3.3  2.4 - 5.1 mg/dL Final   ??? Tacrolimus, Timed 12/26/2020 3.7  ng/mL Final   ??? WBC 12/26/2020 7.0  3.6 - 11.2 10*9/L Final   ??? RBC 12/26/2020 5.38  4.26 - 5.60 10*12/L Final   ??? HGB 12/26/2020 15.2  12.9 - 16.5 g/dL Final   ??? HCT 54/02/8118 45.4  39.0 - 48.0 % Final   ??? MCV 12/26/2020 84.4  77.6 - 95.7 fL Final   ??? MCH 12/26/2020 28.3  25.9 - 32.4 pg Final   ??? MCHC 12/26/2020 33.5  32.0 - 36.0 g/dL Final   ??? RDW 14/78/2956 14.2  12.2 - 15.2 % Final   ??? MPV 12/26/2020 7.9  6.8 - 10.7 fL Final   ??? Platelet 12/26/2020 192  150 - 450 10*9/L Final   ??? nRBC 12/26/2020 0  <=4 /100 WBCs Final   ??? Neutrophils % 12/26/2020 72.7  % Final   ??? Lymphocytes % 12/26/2020 17.4  % Final   ??? Monocytes % 12/26/2020 7.9  % Final   ??? Eosinophils % 12/26/2020 1.4  % Final   ??? Basophils % 12/26/2020 0.6  % Final   ??? Absolute Neutrophils 12/26/2020 5.1  1.8 - 7.8 10*9/L Final   ??? Absolute Lymphocytes 12/26/2020 1.2  1.1 - 3.6 10*9/L Final   ??? Absolute Monocytes 12/26/2020 0.6  0.3 - 0.8 10*9/L Final   ??? Absolute Eosinophils 12/26/2020 0.1  0.0 - 0.5 10*9/L Final   ??? Absolute Basophils 12/26/2020 0.0  0.0 - 0.1 10*9/L Final     Lab Results   Component Value Date    Hemoglobin A1C 9.1 (H) 01/19/2021    Hemoglobin A1C 8.8 (H) 08/08/2020    Hemoglobin A1C 7.4 (H) 02/09/2020    Hemoglobin A1C 8.3 (H) 08/21/2019    HB A1C, RAP/HGATE 6.7 (H) 04/07/2012    HB A1C, RAP/HGATE 7.4 (H) 03/30/2011    HGB A1C, POC 8.8 (H) 07/28/2020    HGB A1C, POC 7.3 (H) 01/27/2020    HGB A1C, POC 9.5 (H) 11/18/2018    HGB A1C, POC 9.3 (H) 04/24/2018    Hemoglobin A1c 7.6 (H) 05/17/2014     No components found for: SODIUM  Lab Results   Component Value Date    K 5.0 (H) 01/19/2021     Lab Results   Component Value Date    CREATININE 1.28 (H) 01/19/2021     Lab Results   Component Value Date    PTH 114.5 (H) 08/21/2019    CALCIUM 9.2 01/19/2021     Lab Results   Component Value Date    ALT 19 01/19/2021    ALT 22 04/21/2015     Lab Results   Component Value Date    CHOL 143 01/19/2021     Lab Results   Component Value Date    LDL 49 01/19/2021     Lab Results   Component Value Date    HDL 30 (L) 01/19/2021     Lab Results   Component Value Date    TRIG 318 (H) 01/19/2021     Lab Results   Component Value Date    TSH 2.202 07/02/2017    TSH 1.840 07/13/2015    TSH 1.420 07/09/2015     Lab Results   Component Value Date    TSH 2.202 07/02/2017  No results found for: CBC  No results found for: VITAMINB12  Lab Results   Component Value Date    VITD2 <5 08/17/2015    VITD3 42 08/17/2015    VITDTOTAL 32.7 08/26/2020     No results found for: Christa See, MSHCGMOM, MALBCRERAT  Lab Results   Component Value Date    Protein, Ur 52.9 08/26/2020    Protein, Ur 15 08/05/2014    Protein/Creatinine Ratio, Urine 0.397 08/26/2020    Protein/Creatinine Ratio, Urine 0.116 08/05/2014       IMAGING:

## 2021-01-24 NOTE — Unmapped (Signed)
Libre CGM linked to clinic, report scanned into media. POC glucose done today. PP 8 am. 219 mg/dL.

## 2021-01-24 NOTE — Unmapped (Signed)
[place] Assessment of Medications Program (CAMP)  Comprehensive Medication Management  RECRUITMENT SUMMARY NOTE     Patient was outreached for CAMP Services. Unable to contact- left message.      Education officer, community  Clinical Operations Specialist/CPhT  [place] Assessment of Medication Program (CAMP)  [phone]

## 2021-01-24 NOTE — Unmapped (Signed)
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  I reviewed the resident???s note.  I agree with the resident???s findings and plan including interpretation of images.     EXTENDED OPHTHALMOSCOPY RESULTS:  OD: MAs, PRP 360  OS: MAs, PRP 360

## 2021-01-24 NOTE — Unmapped (Addendum)
No adjustments in insulin doses other than give u500 before eating.    If you feel Trulicity 1.5mg  is losing any efffect, we can increase to 3mg  weekly dose.     With capping of costs of insulin with medicare, and now on Trulicity, and now with availability of concentrated tresiba u200, I think we we should not rule out changing to basal/bolus to achieve tighter glucose control.  Let me know when running out of u500 if you do want to change and we can explore the costs.

## 2021-01-25 NOTE — Unmapped (Signed)
[place] Assessment of Medications Program (CAMP)  Comprehensive Medication Management  RECRUITMENT SUMMARY NOTE     Patient was outreached for CAMP Services. Patient declined. *has multiple providers who help manage his medications      Damita Eppard  Clinical Operations Specialist/CPhT  [employer] of [facility] (CAMP)  (P) [phone]

## 2021-02-07 NOTE — Unmapped (Signed)
[facility] Refill Coordination Note    Specialty Medication(s) to be Shipped:   Transplant: Myfortic 180mg  and tacrolimus 1mg     Other medication(s) to be shipped: No additional medications requested for fill at this time     David Ellis, DOB: 1945-12-20  Phone: [phone] (home)       All above HIPAA information was verified with patient.     Was a Nurse, learning disability used for this call? No    Completed refill call assessment today to schedule patient's medication shipment from the [facility] [phone]).  All relevant notes have been reviewed.     Specialty medication(s) and dose(s) confirmed: Regimen is correct and unchanged.   Changes to medications: David Ellis reports no changes at this time.  Changes to insurance: No  New side effects reported not previously addressed with a pharmacist or physician: None reported  Questions for the pharmacist: No    Confirmed patient received a Conservation officer, historic buildings and a Surveyor, mining with first shipment. The patient will receive a drug information handout for each medication shipped and additional FDA Medication Guides as required.       DISEASE/MEDICATION-SPECIFIC INFORMATION        N/A    SPECIALTY MEDICATION ADHERENCE     Medication Adherence    Patient reported X missed doses in the last month: 0  Specialty Medication: Myfortic 180mg   Patient is on additional specialty medications: Yes  Additional Specialty Medications: Tacrolimus 1mg   Patient Reported Additional Medication X Missed Doses in the Last Month: 0  Patient is on more than two specialty medications: No  Adherence tools used: patient uses a pill box to manage medications  Support network for adherence: family member        Were doses missed due to medication being on hold? No    Myfortic 180 mg: 10 days of medicine on hand   Tacrolius 1 mg: 10 days of medicine on hand     REFERRAL TO PHARMACIST     Referral to the pharmacist: Not needed      [facility]     Shipping address confirmed in Epic.     Delivery Scheduled: Yes, Expected medication delivery date: 02/15/2021.     Medication will be delivered via Next Day Courier to the prescription address in Epic WAM.    [name]   [facility] Specialty Technician

## 2021-02-14 MED FILL — MYFORTIC 180 MG TABLET,DELAYED RELEASE: ORAL | 30 days supply | Qty: 180 | Fill #2

## 2021-02-14 MED FILL — TACROLIMUS 1 MG CAPSULE, IMMEDIATE-RELEASE: ORAL | 30 days supply | Qty: 150 | Fill #9

## 2021-02-16 MED ORDER — METOPROLOL SUCCINATE ER 100 MG TABLET,EXTENDED RELEASE 24 HR
ORAL_TABLET | 1 refills | 0 days | Status: CP
Start: 2021-02-16 — End: ?

## 2021-02-16 NOTE — Unmapped (Signed)
Patient is requesting the following refill  Requested Prescriptions     Pending Prescriptions Disp Refills   ??? metoprolol succinate (TOPROL-XL) 100 MG 24 hr tablet [Pharmacy Med Name: METOPROLOL SUCCINATE ER 100 MG TAB] 180 tablet 1     Sig: TAKE ONE TABLET TWICE DAILY       Recent Visits  Date Type Provider Dept   01/19/21 Office Visit Sharlee Blew, MD [facility] Internal Medicine [facility]   12/08/20 Office Visit Yehuda Budd, MD [facility] Internal Medicine Cardiology   09/20/20 Office Visit Sharlee Blew, MD [facility] Internal Medicine [facility]   06/09/20 Telemedicine Yehuda Budd, MD [facility] Internal Medicine Cardiology   03/22/20 Office Visit Sharlee Blew, MD [facility] Internal Medicine [facility]   Showing recent visits within past 365 days with a meds authorizing provider and meeting all other requirements  Future Appointments  Date Type Provider Dept   06/13/21 Appointment Yehuda Budd, MD [facility] Internal Medicine Cardiology   07/18/21 Appointment Sharlee Blew, MD [place] Internal Medicine [facility]   Showing future appointments within next 365 days with a meds authorizing provider and meeting all other requirements       Labs:   Vitals:   BP Readings from Last 3 Encounters:   01/24/21 128/100   01/19/21 130/72   01/10/21 116/61    and   Pulse Readings from Last 3 Encounters:   01/24/21 79   01/19/21 77   01/10/21 80

## 2021-02-28 ENCOUNTER — Ambulatory Visit: Admit: 2021-02-28 | Discharge: 2021-03-01 | Payer: MEDICARE

## 2021-02-28 DIAGNOSIS — D849 Immunodeficiency, unspecified: Principal | ICD-10-CM

## 2021-02-28 MED ADMIN — tixagevimab-cilgavimab 300 mg/3 mL- 300 mg/3 mL injection 6 mL: 6 mL | INTRAMUSCULAR | @ 13:00:00 | Stop: 2021-02-28

## 2021-02-28 NOTE — Unmapped (Signed)
Pt here for Evusheld injections. Education and handout provided to pt and questions answered. Signs and symptoms gone over with patient and they verbalized understanding. First injection given to right side gluteal muscle and second given to left side. Pt tolerated without difficulty. Pt in lobby for one hour observation.

## 2021-03-07 ENCOUNTER — Other Ambulatory Visit: Payer: Self-pay

## 2021-03-07 ENCOUNTER — Ambulatory Visit (INDEPENDENT_AMBULATORY_CARE_PROVIDER_SITE_OTHER): Payer: Medicare Other | Admitting: Dermatology

## 2021-03-07 ENCOUNTER — Ambulatory Visit: Admit: 2021-03-07 | Discharge: 2021-03-08 | Payer: MEDICARE

## 2021-03-07 DIAGNOSIS — Z85828 Personal history of other malignant neoplasm of skin: Secondary | ICD-10-CM | POA: Diagnosis not present

## 2021-03-07 DIAGNOSIS — L578 Other skin changes due to chronic exposure to nonionizing radiation: Secondary | ICD-10-CM

## 2021-03-07 DIAGNOSIS — L821 Other seborrheic keratosis: Secondary | ICD-10-CM | POA: Diagnosis not present

## 2021-03-07 DIAGNOSIS — D0462 Carcinoma in situ of skin of left upper limb, including shoulder: Secondary | ICD-10-CM

## 2021-03-07 DIAGNOSIS — D485 Neoplasm of uncertain behavior of skin: Secondary | ICD-10-CM

## 2021-03-07 DIAGNOSIS — Z1283 Encounter for screening for malignant neoplasm of skin: Secondary | ICD-10-CM

## 2021-03-07 DIAGNOSIS — L820 Inflamed seborrheic keratosis: Secondary | ICD-10-CM | POA: Diagnosis not present

## 2021-03-07 DIAGNOSIS — L814 Other melanin hyperpigmentation: Secondary | ICD-10-CM

## 2021-03-07 DIAGNOSIS — D0422 Carcinoma in situ of skin of left ear and external auricular canal: Secondary | ICD-10-CM

## 2021-03-07 DIAGNOSIS — L570 Actinic keratosis: Secondary | ICD-10-CM | POA: Diagnosis not present

## 2021-03-07 LAB — CBC W/ AUTO DIFF
BASOPHILS ABSOLUTE COUNT: 0 10*9/L (ref 0.0–0.1)
BASOPHILS RELATIVE PERCENT: 0.4 %
EOSINOPHILS ABSOLUTE COUNT: 0.1 10*9/L (ref 0.0–0.5)
EOSINOPHILS RELATIVE PERCENT: 1.4 %
HEMATOCRIT: 46.3 % (ref 39.0–48.0)
HEMOGLOBIN: 15.2 g/dL (ref 12.9–16.5)
LYMPHOCYTES ABSOLUTE COUNT: 1.2 10*9/L (ref 1.1–3.6)
LYMPHOCYTES RELATIVE PERCENT: 17.6 %
MEAN CORPUSCULAR HEMOGLOBIN CONC: 32.9 g/dL (ref 32.0–36.0)
MEAN CORPUSCULAR HEMOGLOBIN: 28.1 pg (ref 25.9–32.4)
MEAN CORPUSCULAR VOLUME: 85.4 fL (ref 77.6–95.7)
MEAN PLATELET VOLUME: 7.9 fL (ref 6.8–10.7)
MONOCYTES ABSOLUTE COUNT: 0.5 10*9/L (ref 0.3–0.8)
MONOCYTES RELATIVE PERCENT: 7.4 %
NEUTROPHILS ABSOLUTE COUNT: 4.9 10*9/L (ref 1.8–7.8)
NEUTROPHILS RELATIVE PERCENT: 73.2 %
NUCLEATED RED BLOOD CELLS: 0 /100{WBCs} (ref <=4)
PLATELET COUNT: 178 10*9/L (ref 150–450)
RED BLOOD CELL COUNT: 5.42 10*12/L (ref 4.26–5.60)
RED CELL DISTRIBUTION WIDTH: 14.2 % (ref 12.2–15.2)
WBC ADJUSTED: 6.8 10*9/L (ref 3.6–11.2)

## 2021-03-07 LAB — COMPREHENSIVE METABOLIC PANEL
ALBUMIN: 3.5 g/dL (ref 3.4–5.0)
ALKALINE PHOSPHATASE: 82 U/L (ref 46–116)
ALT (SGPT): 19 U/L (ref 10–49)
ANION GAP: 7 mmol/L (ref 5–14)
AST (SGOT): 22 U/L (ref <=34)
BILIRUBIN TOTAL: 0.8 mg/dL (ref 0.3–1.2)
BLOOD UREA NITROGEN: 25 mg/dL — ABNORMAL HIGH (ref 9–23)
BUN / CREAT RATIO: 21
CALCIUM: 9 mg/dL (ref 8.7–10.4)
CHLORIDE: 104 mmol/L (ref 98–107)
CO2: 28.6 mmol/L (ref 20.0–31.0)
CREATININE: 1.18 mg/dL — ABNORMAL HIGH
EGFR CKD-EPI (2021) MALE: 65 mL/min/{1.73_m2} (ref >=60)
GLUCOSE RANDOM: 196 mg/dL — ABNORMAL HIGH (ref 70–99)
POTASSIUM: 4.7 mmol/L (ref 3.4–4.8)
PROTEIN TOTAL: 6.5 g/dL (ref 5.7–8.2)
SODIUM: 140 mmol/L (ref 135–145)

## 2021-03-07 LAB — HEMOGLOBIN A1C
ESTIMATED AVERAGE GLUCOSE: 209 mg/dL
HEMOGLOBIN A1C: 8.9 % — ABNORMAL HIGH (ref 4.8–5.6)

## 2021-03-07 LAB — TACROLIMUS LEVEL, TIMED: TACROLIMUS BLOOD: 5.5 ng/mL

## 2021-03-07 LAB — PROTIME-INR
INR: 3.08
PROTIME: 36.5 s — ABNORMAL HIGH (ref 9.8–12.8)

## 2021-03-07 LAB — PHOSPHORUS: PHOSPHORUS: 3.7 mg/dL (ref 2.4–5.1)

## 2021-03-07 LAB — LIPID PANEL
CHOLESTEROL/HDL RATIO SCREEN: 4.5 (ref 1.0–4.5)
CHOLESTEROL: 134 mg/dL (ref <=200)
HDL CHOLESTEROL: 30 mg/dL — ABNORMAL LOW (ref 40–60)
LDL CHOLESTEROL CALCULATED: 62 mg/dL (ref 40–99)
NON-HDL CHOLESTEROL: 104 mg/dL (ref 70–130)
TRIGLYCERIDES: 210 mg/dL — ABNORMAL HIGH (ref 0–150)
VLDL CHOLESTEROL CAL: 42 mg/dL (ref 12–42)

## 2021-03-07 LAB — MAGNESIUM: MAGNESIUM: 1.8 mg/dL (ref 1.6–2.6)

## 2021-03-07 NOTE — Progress Notes (Signed)
Follow-Up Visit   Subjective  Larry Bright is a 75 y.o. male who presents for the following: UBSE (Patient has several spots to check on the L shoulder blade, L arm, and nose. 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  He has persistent scaly spots on L earlobe and L arm that were previously frozen.   The following portions of the chart were reviewed this encounter and updated as appropriate:      Review of Systems: No other skin or systemic complaints.  Objective  Well appearing patient in no apparent distress; mood and affect are within normal limits.  All skin waist up examined.  R nasal ala x 1, R sternum x 1, R hand dorsum x 2 (4) Pink scaly macules.  Left Forearm 1.5cm pink scaly patch L forearm        Left Earlobe L earlobe 1.2cm keratotic patch      Left Shoulder x 2, R shoulder x 2, R ear helix x 1, glabella x 1, forhead x 1, L temple x 2 (9) Keratotic papules.   Assessment & Plan  Skin cancer screening performed today.  AK (actinic keratosis) (4) R nasal ala x 1, R sternum x 1, R hand dorsum x 2  Actinic keratoses are precancerous spots that appear secondary to cumulative UV radiation exposure/sun exposure over time. They are chronic with expected duration over 1 year. A portion of actinic keratoses will progress to squamous cell carcinoma of the skin. It is not possible to reliably predict which spots will progress to skin cancer and so treatment is recommended to prevent development of skin cancer.  Recommend daily broad spectrum sunscreen SPF 30+ to sun-exposed areas, reapply every 2 hours as needed.  Recommend staying in the shade or wearing long sleeves, sun glasses (UVA+UVB protection) and wide brim hats (4-inch brim around the entire circumference of the hat). Call for new or changing lesions.  Destruction of lesion - R nasal  ala x 1, R sternum x 1, R hand dorsum x 2  Destruction method: cryotherapy   Informed consent: discussed and consent obtained   Lesion destroyed using liquid nitrogen: Yes   Region frozen until ice ball extended beyond lesion: Yes   Outcome: patient tolerated procedure well with no complications   Post-procedure details: wound care instructions given   Additional details:  Prior to procedure, discussed risks of blister formation, small wound, skin dyspigmentation, or rare scar following cryotherapy. Recommend Vaseline ointment to treated areas while healing.   Neoplasm of uncertain behavior of skin (2) Left Forearm  Skin / nail biopsy Type of biopsy: tangential   Informed consent: discussed and consent obtained   Patient was prepped and draped in usual sterile fashion: Area prepped with alcohol. Anesthesia: the lesion was anesthetized in a standard fashion   Anesthetic:  1% lidocaine w/ epinephrine 1-100,000 buffered w/ 8.4% NaHCO3 Instrument used: flexible razor blade   Hemostasis achieved with: pressure, aluminum chloride and electrodesiccation   Outcome: patient tolerated procedure well   Post-procedure details: wound care instructions given   Post-procedure details comment:  Ointment and small bandage applied  Specimen 1 - Surgical pathology Differential Diagnosis: Inflamed SK r/o SCC IS Check Margins: No 1.5cm pink scaly patch  Left Earlobe  Skin / nail biopsy Type of biopsy: tangential   Informed consent: discussed and consent  obtained   Patient was prepped and draped in usual sterile fashion: Area prepped with alcohol. Anesthesia: the lesion was anesthetized in a standard fashion   Anesthetic:  1% lidocaine w/ epinephrine 1-100,000 buffered w/ 8.4% NaHCO3 Instrument used: flexible razor blade   Hemostasis achieved with: pressure, aluminum chloride and electrodesiccation   Outcome: patient tolerated procedure well   Post-procedure details: wound care instructions given    Post-procedure details comment:  Ointment and small bandage applied  Specimen 2 - Surgical pathology Differential Diagnosis: Inflamed SK r/o SCC IS Check Margins: No L earlobe 1.2cm keratotic patch   Hypertrophic actinic keratosis (9) Left Shoulder x 2, R shoulder x 2, R ear helix x 1, glabella x 1, forhead x 1, L temple x 2  vs ISKs  Actinic keratoses are precancerous spots that appear secondary to cumulative UV radiation exposure/sun exposure over time. They are chronic with expected duration over 1 year. A portion of actinic keratoses will progress to squamous cell carcinoma of the skin. It is not possible to reliably predict which spots will progress to skin cancer and so treatment is recommended to prevent development of skin cancer.  Recommend daily broad spectrum sunscreen SPF 30+ to sun-exposed areas, reapply every 2 hours as needed.  Recommend staying in the shade or wearing long sleeves, sun glasses (UVA+UVB protection) and wide brim hats (4-inch brim around the entire circumference of the hat). Call for new or changing lesions.  Destruction of lesion - Left Shoulder x 2, R shoulder x 2, R ear helix x 1, glabella x 1, forhead x 1, L temple x 2  Destruction method: cryotherapy   Informed consent: discussed and consent obtained   Lesion destroyed using liquid nitrogen: Yes   Region frozen until ice ball extended beyond lesion: Yes   Outcome: patient tolerated procedure well with no complications   Post-procedure details: wound care instructions given   Additional details:  Prior to procedure, discussed risks of blister formation, small wound, skin dyspigmentation, or rare scar following cryotherapy. Recommend Vaseline ointment to treated areas while healing.   Inflamed seborrheic keratosis Left spinal Upper Back x 2  Destruction of lesion - Left spinal Upper Back x 2  Destruction method: cryotherapy   Informed consent: discussed and consent obtained   Lesion destroyed  using liquid nitrogen: Yes   Region frozen until ice ball extended beyond lesion: Yes   Outcome: patient tolerated procedure well with no complications   Post-procedure details: wound care instructions given   Additional details:  Prior to procedure, discussed risks of blister formation, small wound, skin dyspigmentation, or rare scar following cryotherapy. Recommend Vaseline ointment to treated areas while healing.    Actinic Damage - chronic, secondary to cumulative UV radiation exposure/sun exposure over time - diffuse scaly erythematous macules with underlying dyspigmentation - Recommend daily broad spectrum sunscreen SPF 30+ to sun-exposed areas, reapply every 2 hours as needed.  - Recommend staying in the shade or wearing long sleeves, sun glasses (UVA+UVB protection) and wide brim hats (4-inch brim around the entire circumference of the hat). - Call for new or changing lesions.  History of Basal Cell Carcinoma of the Skin - No evidence of recurrence today L medial cheek, R nasal dorsum - Recommend regular full body skin exams - Recommend daily broad spectrum sunscreen SPF 30+ to sun-exposed areas, reapply every 2 hours as needed.  - Call if any new or changing lesions are noted between office visits  History of Squamous Cell Carcinoma of the  Skin - No evidence of recurrence today L antihelix - Recommend regular full body skin exams - Recommend daily broad spectrum sunscreen SPF 30+ to sun-exposed areas, reapply every 2 hours as needed.  - Call if any new or changing lesions are noted between office visits  Seborrheic Keratoses - Stuck-on, waxy, tan-brown papules and/or plaques  - Benign-appearing - Discussed benign etiology and prognosis. - Observe - Call for any changes  Lentigines - Scattered tan macules - Due to sun exposure - Benign-appering, observe - Recommend daily broad spectrum sunscreen SPF 30+ to sun-exposed areas, reapply every 2 hours as needed. - Call for any  changes   Return in about 6 months (around 09/05/2021) for AKs, sooner pending bx.  IJamesetta Orleans, CMA, am acting as scribe for Brendolyn Patty, MD.  Documentation: I have reviewed the above documentation for accuracy and completeness, and I agree with the above.  Brendolyn Patty MD

## 2021-03-07 NOTE — Patient Instructions (Signed)
Wound Care Instructions  Cleanse wound gently with soap and water once a day then pat dry with clean gauze. Apply a thing coat of Petrolatum (petroleum jelly, "Vaseline") over the wound (unless you have an allergy to this). We recommend that you use a new, sterile tube of Vaseline. Do not pick or remove scabs. Do not remove the yellow or white "healing tissue" from the base of the wound.  Cover the wound with fresh, clean, nonstick gauze and secure with paper tape. You may use Band-Aids in place of gauze and tape if the would is small enough, but would recommend trimming much of the tape off as there is often too much. Sometimes Band-Aids can irritate the skin.  You should call the office for your biopsy report after 1 week if you have not already been contacted.  If you experience any problems, such as abnormal amounts of bleeding, swelling, significant bruising, significant pain, or evidence of infection, please call the office immediately.  FOR ADULT SURGERY PATIENTS: If you need something for pain relief you may take 1 extra strength Tylenol (acetaminophen) AND 2 Ibuprofen (200mg each) together every 4 hours as needed for pain. (do not take these if you are allergic to them or if you have a reason you should not take them.) Typically, you may only need pain medication for 1 to 3 days.   If you have any questions or concerns for your doctor, please call our main line at [phone] and press option 4 to reach your doctor's medical assistant. If no one answers, please leave a voicemail as directed and we will return your call as soon as possible. Messages left after 4 pm will be answered the following business day.   You may also send us a message via MyChart. We typically respond to MyChart messages within 1-2 business days.  For prescription refills, please ask your pharmacy to contact our office. Our fax number is [phone].  If you have an urgent issue when the clinic is closed that  cannot wait until the next business day, you can page your doctor at the number below.    Please note that while we do our best to be available for urgent issues outside of office hours, we are not available 24/7.   If you have an urgent issue and are unable to reach us, you may choose to seek medical care at your doctor's office, retail clinic, urgent care center, or emergency room.  If you have a medical emergency, please immediately call 911 or go to the emergency department.  Pager Numbers  - Dr. Kowalski: [phone]  - Dr. Moye: [phone]  - Dr. Stewart: [phone]  In the event of inclement weather, please call our main line at [phone] for an update on the status of any delays or closures.  Dermatology Medication Tips: Please keep the boxes that topical medications come in in order to help keep track of the instructions about where and how to use these. Pharmacies typically print the medication instructions only on the boxes and not directly on the medication tubes.   If your medication is too expensive, please contact our office at [phone] option 4 or send us a message through MyChart.   We are unable to tell what your co-pay for medications will be in advance as this is different depending on your insurance coverage. However, we may be able to find a substitute medication at lower cost or fill out paperwork to get insurance to cover a needed   medication.   If a prior authorization is required to get your medication covered by your insurance company, please allow us 1-2 business days to complete this process.  Drug prices often vary depending on where the prescription is filled and some pharmacies may offer cheaper prices.  The website www.goodrx.com contains coupons for medications through different pharmacies. The prices here do not account for what the cost may be with help from insurance (it may be cheaper with your insurance), but the website can give you the  price if you did not use any insurance.  - You can print the associated coupon and take it with your prescription to the pharmacy.  - You may also stop by our office during regular business hours and pick up a GoodRx coupon card.  - If you need your prescription sent electronically to a different pharmacy, notify our office through [facility] MyChart or by phone at [phone] option 4.   

## 2021-03-09 NOTE — Unmapped (Signed)
[facility] Refill Coordination Note    Specialty Medication(s) to be Shipped:   Transplant: Myfortic 180mg  and tacrolimus 1mg     Other medication(s) to be shipped: No additional medications requested for fill at this time     David Ellis, DOB: August 17, 1945  Phone: [phone] (home)       All above HIPAA information was verified with patient.     Was a Nurse, learning disability used for this call? No    Completed refill call assessment today to schedule patient's medication shipment from the [facility] [phone]).  All relevant notes have been reviewed.     Specialty medication(s) and dose(s) confirmed: Regimen is correct and unchanged.   Changes to medications: Levoy reports no changes at this time.  Changes to insurance: No  New side effects reported not previously addressed with a pharmacist or physician: None reported  Questions for the pharmacist: No    Confirmed patient received a Conservation officer, historic buildings and a Surveyor, mining with first shipment. The patient will receive a drug information handout for each medication shipped and additional FDA Medication Guides as required.       DISEASE/MEDICATION-SPECIFIC INFORMATION        N/A    SPECIALTY MEDICATION ADHERENCE     Medication Adherence    Patient reported X missed doses in the last month: 0  Specialty Medication: Myfortic 180mg   Patient is on additional specialty medications: Yes  Additional Specialty Medications: Tacrolimus 1mg   Patient Reported Additional Medication X Missed Doses in the Last Month: 0  Patient is on more than two specialty medications: No  Adherence tools used: patient uses a pill box to manage medications  Support network for adherence: family member              Were doses missed due to medication being on hold? No    Myfortic 180 mg: 7 days of medicine on hand   Tacrolimus 1 mg: 7 days of medicine on hand       REFERRAL TO PHARMACIST     Referral to the pharmacist: Not needed      [facility] Shipping address confirmed in Epic.     Delivery Scheduled: Yes, Expected medication delivery date: 03/16/21.     Medication will be delivered via Next Day Courier to the prescription address in Epic WAM.    [name]   [facility] Specialty Pharmacist

## 2021-03-13 ENCOUNTER — Telehealth: Payer: Self-pay

## 2021-03-13 NOTE — Telephone Encounter (Signed)
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 EDC scheduled for 03/29/21 at 12:00pm.

## 2021-03-13 NOTE — Telephone Encounter (Signed)
-----   Message from Brendolyn Patty, MD sent at 03/13/2021 12:53 PM EDT ----- 1. 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 Skin , left earlobe SQUAMOUS CELL CARCINOMA IN SITU, HYPERTROPHIC  1. and 2. both SCCIS skin cancer and both need EDC - please call patient

## 2021-03-15 MED FILL — TACROLIMUS 1 MG CAPSULE, IMMEDIATE-RELEASE: ORAL | 30 days supply | Qty: 150 | Fill #10

## 2021-03-15 MED FILL — MYFORTIC 180 MG TABLET,DELAYED RELEASE: ORAL | 30 days supply | Qty: 180 | Fill #3

## 2021-03-23 DIAGNOSIS — G629 Polyneuropathy, unspecified: Principal | ICD-10-CM

## 2021-03-23 MED ORDER — GABAPENTIN 300 MG CAPSULE
ORAL_CAPSULE | 3 refills | 0 days
Start: 2021-03-23 — End: ?

## 2021-03-29 ENCOUNTER — Ambulatory Visit (INDEPENDENT_AMBULATORY_CARE_PROVIDER_SITE_OTHER): Payer: Medicare Other | Admitting: Dermatology

## 2021-03-29 ENCOUNTER — Other Ambulatory Visit: Payer: Self-pay

## 2021-03-29 ENCOUNTER — Encounter: Payer: Self-pay | Admitting: Dermatology

## 2021-03-29 DIAGNOSIS — D0422 Carcinoma in situ of skin of left ear and external auricular canal: Secondary | ICD-10-CM

## 2021-03-29 DIAGNOSIS — L578 Other skin changes due to chronic exposure to nonionizing radiation: Secondary | ICD-10-CM

## 2021-03-29 DIAGNOSIS — D0462 Carcinoma in situ of skin of left upper limb, including shoulder: Secondary | ICD-10-CM | POA: Diagnosis not present

## 2021-03-29 DIAGNOSIS — T1490XA Injury, unspecified, initial encounter: Secondary | ICD-10-CM

## 2021-03-29 DIAGNOSIS — D099 Carcinoma in situ, unspecified: Secondary | ICD-10-CM

## 2021-03-29 MED ORDER — MUPIROCIN 2 % EX OINT
1.0000 | TOPICAL_OINTMENT | Freq: Every day | CUTANEOUS | 1 refills | Status: DC
Start: 2021-03-29 — End: 2023-02-08

## 2021-03-29 NOTE — Patient Instructions (Signed)
Wound Care Instructions  Cleanse wound gently with soap and water once a day then pat dry with clean gauze. Apply a thing coat of Petrolatum (petroleum jelly, "Vaseline") over the wound (unless you have an allergy to this). We recommend that you use a new, sterile tube of Vaseline. Do not pick or remove scabs. Do not remove the yellow or white "healing tissue" from the base of the wound.  Cover the wound with fresh, clean, nonstick gauze and secure with paper tape. You may use Band-Aids in place of gauze and tape if the would is small enough, but would recommend trimming much of the tape off as there is often too much. Sometimes Band-Aids can irritate the skin.  You should call the office for your biopsy report after 1 week if you have not already been contacted.  If you experience any problems, such as abnormal amounts of bleeding, swelling, significant bruising, significant pain, or evidence of infection, please call the office immediately.  FOR ADULT SURGERY PATIENTS: If you need something for pain relief you may take 1 extra strength Tylenol (acetaminophen) AND 2 Ibuprofen (200mg each) together every 4 hours as needed for pain. (do not take these if you are allergic to them or if you have a reason you should not take them.) Typically, you may only need pain medication for 1 to 3 days.   If you have any questions or concerns for your doctor, please call our main line at [phone] and press option 4 to reach your doctor's medical assistant. If no one answers, please leave a voicemail as directed and we will return your call as soon as possible. Messages left after 4 pm will be answered the following business day.   You may also send us a message via MyChart. We typically respond to MyChart messages within 1-2 business days.  For prescription refills, please ask your pharmacy to contact our office. Our fax number is [phone].  If you have an urgent issue when the clinic is closed that  cannot wait until the next business day, you can page your doctor at the number below.    Please note that while we do our best to be available for urgent issues outside of office hours, we are not available 24/7.   If you have an urgent issue and are unable to reach us, you may choose to seek medical care at your doctor's office, retail clinic, urgent care center, or emergency room.  If you have a medical emergency, please immediately call 911 or go to the emergency department.  Pager Numbers  - Dr. Kowalski: [phone]  - Dr. Moye: [phone]  - Dr. Stewart: [phone]  In the event of inclement weather, please call our main line at [phone] for an update on the status of any delays or closures.  Dermatology Medication Tips: Please keep the boxes that topical medications come in in order to help keep track of the instructions about where and how to use these. Pharmacies typically print the medication instructions only on the boxes and not directly on the medication tubes.   If your medication is too expensive, please contact our office at [phone] option 4 or send us a message through MyChart.   We are unable to tell what your co-pay for medications will be in advance as this is different depending on your insurance coverage. However, we may be able to find a substitute medication at lower cost or fill out paperwork to get insurance to cover a needed   medication.   If a prior authorization is required to get your medication covered by your insurance company, please allow us 1-2 business days to complete this process.  Drug prices often vary depending on where the prescription is filled and some pharmacies may offer cheaper prices.  The website www.goodrx.com contains coupons for medications through different pharmacies. The prices here do not account for what the cost may be with help from insurance (it may be cheaper with your insurance), but the website can give you the  price if you did not use any insurance.  - You can print the associated coupon and take it with your prescription to the pharmacy.  - You may also stop by our office during regular business hours and pick up a GoodRx coupon card.  - If you need your prescription sent electronically to a different pharmacy, notify our office through  MyChart or by phone at [phone] option 4.   

## 2021-03-29 NOTE — Progress Notes (Signed)
   Follow-Up Visit   Subjective  Larry Bright is a 75 y.o. male who presents for the following: SCC in situ (Left forearm, left earlobe. Patient presents for EDC x 2.).   The following portions of the chart were reviewed this encounter and updated as appropriate:     Wife with patient.   Review of Systems:  No other skin or systemic complaints except as noted in HPI or Assessment and Plan.  Objective  Well appearing patient in no apparent distress; mood and affect are within normal limits.  A focused examination was performed including face, left forearm, left earlobe. Relevant physical exam findings are noted in the Assessment and Plan.  Left Forearm Pink crusted biopsy site.  Left Earlobe Pink biopsy site.   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  Destruction of lesion  Destruction method: electrodesiccation and curettage   Informed consent: discussed and consent obtained   Timeout:  patient name, date of birth, surgical site, and procedure verified Anesthesia: the lesion was anesthetized in a standard fashion   Anesthetic:  1% lidocaine w/ epinephrine 1-100,000 local infiltration Curettage performed in three different directions: Yes   Electrodesiccation performed over the curetted area: Yes   Lesion length (cm):  1.2 Final wound size (cm):  1.9 Hemostasis achieved with:  pressure, aluminum chloride and electrodesiccation Outcome: patient tolerated procedure well with no complications   Post-procedure details: wound care instructions given   Post-procedure details comment:  Ointment and bandage applied.  Left Earlobe  Destruction of lesion  Destruction method: electrodesiccation and curettage   Informed consent: discussed and consent obtained   Timeout:  patient name, date of birth, surgical site, and procedure verified Anesthesia: the lesion was anesthetized in a standard fashion   Anesthetic:  1% lidocaine w/ epinephrine  1-100,000 local infiltration Curettage performed in three different directions: Yes   Electrodesiccation performed over the curetted area: Yes   Lesion length (cm):  1.5 Final wound size (cm):  1.9 Hemostasis achieved with:  pressure, aluminum chloride and electrodesiccation Outcome: patient tolerated procedure well with no complications   Post-procedure details: wound care instructions given   Post-procedure details comment:  Ointment and bandage applied.  Biopsy proven.   Start Mupirocin ointment bid and cover until healed  Traumatic injury  Related Medications mupirocin ointment (BACTROBAN) 2 % Apply 1 application topically daily. To wounds with bandage change, until healed  Actinic Damage - chronic, secondary to cumulative UV radiation exposure/sun exposure over time - diffuse scaly erythematous macules with underlying dyspigmentation - Recommend daily broad spectrum sunscreen SPF 30+ to sun-exposed areas, reapply every 2 hours as needed.  - Recommend staying in the shade or wearing long sleeves, sun glasses (UVA+UVB protection) and wide brim hats (4-inch brim around the entire circumference of the hat). - Call for new or changing lesions.   Return for AK recheck, SCC recheck as scheduled. Loraine Maple, CMA, am acting as scribe for Brendolyn Patty, MD.  Documentation: I have reviewed the above documentation for accuracy and completeness, and I agree with the above.  Brendolyn Patty MD

## 2021-04-07 ENCOUNTER — Ambulatory Visit: Admit: 2021-04-07 | Discharge: 2021-04-08 | Payer: MEDICARE

## 2021-04-07 LAB — COMPREHENSIVE METABOLIC PANEL
ALBUMIN: 3.4 g/dL (ref 3.4–5.0)
ALKALINE PHOSPHATASE: 82 U/L (ref 46–116)
ALT (SGPT): 22 U/L (ref 10–49)
ANION GAP: 7 mmol/L (ref 5–14)
AST (SGOT): 27 U/L (ref <=34)
BILIRUBIN TOTAL: 0.8 mg/dL (ref 0.3–1.2)
BLOOD UREA NITROGEN: 25 mg/dL — ABNORMAL HIGH (ref 9–23)
BUN / CREAT RATIO: 21
CALCIUM: 8.9 mg/dL (ref 8.7–10.4)
CHLORIDE: 105 mmol/L (ref 98–107)
CO2: 28.4 mmol/L (ref 20.0–31.0)
CREATININE: 1.19 mg/dL — ABNORMAL HIGH
EGFR CKD-EPI (2021) MALE: 64 mL/min/{1.73_m2} (ref >=60)
GLUCOSE RANDOM: 271 mg/dL — ABNORMAL HIGH (ref 70–179)
POTASSIUM: 4.6 mmol/L (ref 3.4–4.8)
PROTEIN TOTAL: 6.2 g/dL (ref 5.7–8.2)
SODIUM: 140 mmol/L (ref 135–145)

## 2021-04-07 LAB — LIPID PANEL
CHOLESTEROL/HDL RATIO SCREEN: 4.3 (ref 1.0–4.5)
CHOLESTEROL: 138 mg/dL (ref <=200)
HDL CHOLESTEROL: 32 mg/dL — ABNORMAL LOW (ref 40–60)
LDL CHOLESTEROL CALCULATED: 65 mg/dL (ref 40–99)
NON-HDL CHOLESTEROL: 106 mg/dL (ref 70–130)
TRIGLYCERIDES: 206 mg/dL — ABNORMAL HIGH (ref 0–150)
VLDL CHOLESTEROL CAL: 41.2 mg/dL (ref 12–42)

## 2021-04-07 LAB — CBC W/ AUTO DIFF
BASOPHILS ABSOLUTE COUNT: 0 10*9/L (ref 0.0–0.1)
BASOPHILS RELATIVE PERCENT: 0.7 %
EOSINOPHILS ABSOLUTE COUNT: 0.1 10*9/L (ref 0.0–0.5)
EOSINOPHILS RELATIVE PERCENT: 1.2 %
HEMATOCRIT: 43.7 % (ref 39.0–48.0)
HEMOGLOBIN: 14.8 g/dL (ref 12.9–16.5)
LYMPHOCYTES ABSOLUTE COUNT: 1.2 10*9/L (ref 1.1–3.6)
LYMPHOCYTES RELATIVE PERCENT: 17.6 %
MEAN CORPUSCULAR HEMOGLOBIN CONC: 33.9 g/dL (ref 32.0–36.0)
MEAN CORPUSCULAR HEMOGLOBIN: 28.6 pg (ref 25.9–32.4)
MEAN CORPUSCULAR VOLUME: 84.6 fL (ref 77.6–95.7)
MEAN PLATELET VOLUME: 7.9 fL (ref 6.8–10.7)
MONOCYTES ABSOLUTE COUNT: 0.4 10*9/L (ref 0.3–0.8)
MONOCYTES RELATIVE PERCENT: 6.8 %
NEUTROPHILS ABSOLUTE COUNT: 4.9 10*9/L (ref 1.8–7.8)
NEUTROPHILS RELATIVE PERCENT: 73.7 %
NUCLEATED RED BLOOD CELLS: 0 /100{WBCs} (ref <=4)
PLATELET COUNT: 174 10*9/L (ref 150–450)
RED BLOOD CELL COUNT: 5.17 10*12/L (ref 4.26–5.60)
RED CELL DISTRIBUTION WIDTH: 14.1 % (ref 12.2–15.2)
WBC ADJUSTED: 6.6 10*9/L (ref 3.6–11.2)

## 2021-04-07 LAB — MAGNESIUM: MAGNESIUM: 2 mg/dL (ref 1.6–2.6)

## 2021-04-07 LAB — PHOSPHORUS: PHOSPHORUS: 3.6 mg/dL (ref 2.4–5.1)

## 2021-04-07 LAB — HEMOGLOBIN A1C
ESTIMATED AVERAGE GLUCOSE: 223 mg/dL
HEMOGLOBIN A1C: 9.4 % — ABNORMAL HIGH (ref 4.8–5.6)

## 2021-04-07 LAB — PROTIME-INR
INR: 2.09
PROTIME: 24.4 s — ABNORMAL HIGH (ref 9.8–12.8)

## 2021-04-07 LAB — TACROLIMUS LEVEL: TACROLIMUS BLOOD: 3.9 ng/mL

## 2021-04-10 NOTE — Unmapped (Signed)
[facility] Refill Coordination Note    Specialty Medication(s) to be Shipped:   Transplant: Myfortic 180mg  and tacrolimus 1mg     Other medication(s) to be shipped: No additional medications requested for fill at this time     David Ellis, DOB: 02/19/1946  Phone: [phone] (home)       All above HIPAA information was verified with patient.     Was a Nurse, learning disability used for this call? No    Completed refill call assessment today to schedule patient's medication shipment from the [facility] [phone]).  All relevant notes have been reviewed.     Specialty medication(s) and dose(s) confirmed: Regimen is correct and unchanged.   Changes to medications: Salvadore reports no changes at this time.  Changes to insurance: No  New side effects reported not previously addressed with a pharmacist or physician: None reported  Questions for the pharmacist: No    Confirmed patient received a Conservation officer, historic buildings and a Surveyor, mining with first shipment. The patient will receive a drug information handout for each medication shipped and additional FDA Medication Guides as required.       DISEASE/MEDICATION-SPECIFIC INFORMATION        N/A    SPECIALTY MEDICATION ADHERENCE     Medication Adherence    Patient reported X missed doses in the last month: 0  Specialty Medication: tacrolimus 1 MG capsule (PROGRAF)  Patient is on additional specialty medications: Yes  Additional Specialty Medications: MYFORTIC 180 MG EC tablet (mycophenolate)  Patient Reported Additional Medication X Missed Doses in the Last Month: 0  Adherence tools used: patient uses a pill box to manage medications  Support network for adherence: family member        Myfortic 180mg    8 days worth of medication on hand.  tacrolimus 1mg   8 days worth of medication on hand.        Were doses missed due to medication being on hold? No        REFERRAL TO PHARMACIST     Referral to the pharmacist: Not needed      [facility] Shipping address confirmed in Epic.     Delivery Scheduled: Yes, Expected medication delivery date: 04/13/21.     Medication will be delivered via Next Day Courier to the prescription address in Epic WAM.    David Ellis   [facility] Specialty Technician

## 2021-04-12 MED FILL — MYFORTIC 180 MG TABLET,DELAYED RELEASE: ORAL | 30 days supply | Qty: 180 | Fill #4

## 2021-04-12 MED FILL — TACROLIMUS 1 MG CAPSULE, IMMEDIATE-RELEASE: ORAL | 30 days supply | Qty: 150 | Fill #11

## 2021-04-17 DIAGNOSIS — I10 Essential (primary) hypertension: Principal | ICD-10-CM

## 2021-04-17 MED ORDER — FUROSEMIDE 40 MG TABLET
ORAL_TABLET | ORAL | 3 refills | 0.00000 days | Status: CP
Start: 2021-04-17 — End: ?

## 2021-04-20 NOTE — Unmapped (Signed)
PCP:  David Addison, MD    05/02/21    ASSESSMENT/PLAN: [name] is a 75 y.o. 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    -- S/P LURD kidney transplant 2013 with usual baseline creatinine ~1.6-2.0, but running lower more recently.  Has a history of a DQ 7 donor specific antibody noted on 09/17/2011 with all subsequent HLA antibody screens revealing no DSA's. Tac troughs running a little lower, with most recent at 3.9 but have discussed previously with Dr. Margaretmary Ellis who felt given his stability to continue on current regiment of Prograf 3 mg qAM, 2 mg qPM.      --Stage A2G3a CKD:  Has persistent albuminuria so continue losartan 25 mg daily. Below his baseline creatinine on 04/07/21.    -- HTN -- Well controlled - BP in clinic today 118/62. Continue metoprolol, losartan and lasix.    -- T2DM. HbA1c 9.4 on 04/07/21 currently on Trulicity and insulin. Due to see endocrinology soon, so no changes made today. History of proliferative retinopathy s/p PPV/endolaser treatments, last evaluated by ophthalmology in 01/2021.     -- ASHD/CVA history. Continue pravastatin and ASA.     -- A-Fib. Continue warfarin. INR f/b David Ellis. Have previously discussed possibility of transitioning to DOAC, but he feels since he has to get blood drawn monthly anyways, he may as well stay on the warfarin.    -- HM: Had his covid vaccinations and is UTD on flu, pneumonia and shingrix. Was due for colonoscopy but reportedly GI is leery of stopping anticoagulation so are following cologuard test results. No FH colon CA. Continue biannual follow-up with dermatology re: recurrent basal cell CA. Given high risk for severe COVID19 infection, he received Evusheld injections on 06/28/20, 08/26/20, 02/28/21.    -- High falls risk: No recent falls. Has walker and cane but still very unstable with this and not getting out of the house because of this. I recommended again that he try to get out walking and restart physical therapy if feasible. I also recommended he start walking in the mall 2 days per week to increase activity levels.    [name] will follow up with Dr. Margaretmary Ellis in 3 months for his annual post-transplant evaluation and then will see me in 6 months.   ______________________________________________________________________________________________________    HPI: [name] is a 75 y.o. man s/p LURD kidney transplant (from his wife, David Ellis) 08/21/2011 and B/L creatinine ~1.4-1.7 who returns for f/u. 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     He has had multiple complications since his transplant including a large, provoked (long car ride) pulmonary embolus requiring thrombolysis (Dec 2014), osteomyelitis of his left foot requiring 4th/5th toe amputation (2016), recurrent basal cell CA, CVA s/p thombolysis (07/2015). He has significant PAD and neuropathy and has no sensation on either leg below his knees. He also has severe retinopathy and his vision is quite poor so no longer drives.     Interval history:   No acute hospitalizations or illnesses since last visit on 01/10/2021. He received Evusheld injection 02/28/2021. Had  another basal cell lesion removed.Today patient presents feeling overall well. He and his wife continue to limit leaving the house and wear a mask when going out. Generally feels well but has poor balance and exercise tolerance which is what really limits him. No CP, SOB, cough, N/V/D, edema.      ROS: As per HPI. The remainder of the 10 system review is negative.    PAST MEDICAL HISTORY:  Past Medical History:   Diagnosis Date   ??? Atrial flutter (CMS-HCC)    ??? Diabetes mellitus (CMS-HCC)    ??? Diabetic nephropathy (CMS-HCC)    ??? Diabetic retinopathy (CMS-HCC)    ??? Fractures    ??? Ganglion cyst    ??? Hand injury    ??? Heart disease    ??? Hyperlipidemia    ??? Hypertension    ??? Joint pain    ??? Osteomyelitis (CMS-HCC) June 2016   ??? Pulmonary embolism (CMS-HCC) Dec 2015   ??? 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 Stroke (CMS-HCC)    ??? Tear of meniscus of knee    ??? Transplanted kidney 08/21/2011       ALLERGIES  Levofloxacin; Lidocaine; Penicillins; Egg derived; Enalapril; Epinephrine; Grass pollen-bermuda, standard; Lisinopril; and Mepivacaine hcl    MEDICATIONS:  Current Outpatient Medications   Medication Sig Dispense Refill   ??? aspirin (ECOTRIN) 81 MG tablet Take 81 mg by mouth daily as needed (when flying).     ??? cholecalciferol, vitamin D3-50 mcg, 2,000 unit,, 50 mcg (2,000 unit) tablet Take 2,000 Units by mouth daily.      ??? furosemide (LASIX) 40 MG tablet Take 1-2 tablets daily as needed for edema. 180 tablet 3   ??? gabapentin (NEURONTIN) 300 MG capsule Take 1 capsule (300 mg total) by mouth Two (2) times a day. 180 capsule 3   ??? HUMULIN R U-500, CONC, KWIKPEN 500 unit/mL (3 mL) CONCENTRATED injection DIAL TO MAX 100 UNITS BEFORE BREAKFAST, LUNCH AND DINNER. INJECT 30 MINUTES BEFORE MEALS. MAX OF 300 UNITS DAILY. (Patient taking differently: Per sliding scale) 18 mL 11   ??? losartan (COZAAR) 25 MG tablet TAKE ONE TABLET BY MOUTH EVERY DAY 90 tablet 3   ??? magnesium chloride (SLOW_MAG) 64 mg TbEC Take 128 mg by mouth three (3) times a day (at 6am, noon and 6pm). (2 tablets) 9am 1 pm and 9 pm     ??? metoprolol succinate (TOPROL-XL) 100 MG 24 hr tablet TAKE ONE TABLET TWICE DAILY 180 tablet 1   ??? multivitamin capsule Take 1 capsule by mouth daily.     ??? MYFORTIC 180 mg EC tablet Take 3 tablets (540 mg total) by mouth two (2) times a day. 540 tablet 3   ??? pravastatin (PRAVACHOL) 20 MG tablet TAKE ONE TABLET BY MOUTH EVERY DAY 90 tablet 3   ??? tacrolimus (PROGRAF) 1 MG capsule Take 3 capsules (3 mg total) by mouth in the morning AND 2 capsules (2 mg total) in the evening. 450 capsule 3   ??? TRULICITY 1.5 mg/0.5 mL PnIj INJECT 1.5MG (0.5ML) SUBCUTANEOUSLY ONCE A WEEK 2 mL 11   ??? warfarin (COUMADIN) 2.5 MG tablet Take 1 tablet (2.5 mg total) by mouth daily. (Patient taking differently: Take 2.5 mg by mouth once a week. On Mondays 2.5mg  PO 5 mg QOD) 30 tablet 11   ??? blood-glucose meter kit Use as instructed. 1 each 0   ??? flash glucose sensor (FREESTYLE  LIBRE 14 DAY SENSOR) kit by Other route every fourteen (14) days. 2 each 11   ??? miscellaneous medical supply Misc 1 application by Miscellaneous route daily as needed. Lightweight wheelchair. 1 each 0   ??? miscellaneous medical supply Misc 1 application by Miscellaneous route nightly. Recommend CPAP 14 cm H2O with EPR 3 and heated humidifier for nasal dryness, mask: ResMed Activa Lt nasal mask (Large). 1 each 0   ??? pen needle, diabetic (BD ULTRA-FINE NANO PEN NEEDLE) 32 gauge x 5/32 (4 mm) Ndle 1 each by Other route Three (3) times a day before meals. 300 each 3   ??? warfarin (JANTOVEN) 5 MG tablet Take 1 tablet (5 mg total) by mouth daily with evening meal. TAKE 6 DAYS A WEEK - Tuesday through Sunday 90 tablet 3     No current facility-administered medications for this visit.     Facility-Administered Medications Ordered in Other Visits   Medication Dose Route Frequency Provider Last Rate Last Admin   ??? bevacizumab    PRN (once a day) Melvyn Novas, MD   2.5 mg at 01/28/17 0750     PHYSICAL EXAM:  Vitals:    05/02/21 1116   BP: 118/62   Pulse: 79   Temp: 36.7 ??C (98 ??F)     CONSTITUTIONAL: Alert,well appearing, no distress  HEENT: Moist mucous membranes, oropharynx clear without erythema or exudate  NECK: Supple, no lymphadenopathy  CARDIOVASCULAR: Regular, normal S1/S2 heart sounds, no murmurs, no rubs.   PULM: Clear to auscultation bilaterally  GASTROINTESTINAL: Soft, active bowel sounds, nontender  EXTREMITIES: Trace B/L woody edema bilaterally  SKIN: No rashes     MEDICAL DECISION MAKING  Lab Results   Component Value Date    NA 140 04/07/2021    K 4.6 04/07/2021    CO2 28.4 04/07/2021    BUN 25 (H) 04/07/2021    CREATININE 1.19 (H) 04/07/2021    CALCIUM 8.9 04/07/2021    PHOS 3.6 04/07/2021     Lab Results   Component Value Date    WBC 6.6 04/07/2021    HGB 14.8 04/07/2021    PLT 174 04/07/2021     Lab Results   Component Value Date    TACROLIMUS 3.9 04/07/2021     Lab Results   Component Value Date    A1C 9.4 (H) 04/07/2021       Scribe's Attestation: Trey Sailors, MD obtained and performed the history, physical exam and medical decision making elements that were entered into the chart. Signed by Lauralee Evener, Scribe, on May 02, 2021 11:58 AM.     Attending Statement: Documentation assistance provided by the Scribe. I was present during the time the encounter was recorded. The information recorded by the Scribe was done at my direction and has been reviewed and validated by me.

## 2021-05-02 ENCOUNTER — Ambulatory Visit: Admit: 2021-05-02 | Discharge: 2021-05-03 | Payer: MEDICARE

## 2021-05-02 NOTE — Unmapped (Signed)
AOBP r arm large cuff  Average 118/62 (79)  1st 125/69 (78)  2nd 119/60 (80)  3rd  109/57 (80)

## 2021-05-03 DIAGNOSIS — Z940 Kidney transplant status: Principal | ICD-10-CM

## 2021-05-03 MED ORDER — TACROLIMUS 1 MG CAPSULE, IMMEDIATE-RELEASE
ORAL_CAPSULE | ORAL | 3 refills | 90 days | Status: CP
Start: 2021-05-03 — End: ?
  Filled 2021-05-10: qty 150, 30d supply, fill #0

## 2021-05-03 NOTE — Unmapped (Signed)
[facility] Clinical Assessment & Refill Coordination Note    David Ellis, DOB: 1945-08-30  Phone: [phone] (home)     All above HIPAA information was verified with patient.     Was a Nurse, learning disability used for this call? No    Specialty Medication(s):   Transplant: Myfortic 180mg  and tacrolimus 1mg      Current Outpatient Medications   Medication Sig Dispense Refill   ??? aspirin (ECOTRIN) 81 MG tablet Take 81 mg by mouth daily as needed (when flying).     ??? blood-glucose meter kit Use as instructed. 1 each 0   ??? cholecalciferol, vitamin D3-50 mcg, 2,000 unit,, 50 mcg (2,000 unit) tablet Take 2,000 Units by mouth daily.      ??? flash glucose sensor (FREESTYLE LIBRE 14 DAY SENSOR) kit by Other route every fourteen (14) days. 2 each 11   ??? furosemide (LASIX) 40 MG tablet Take 1-2 tablets daily as needed for edema. 180 tablet 3   ??? gabapentin (NEURONTIN) 300 MG capsule Take 1 capsule (300 mg total) by mouth Two (2) times a day. 180 capsule 3   ??? HUMULIN R U-500, CONC, KWIKPEN 500 unit/mL (3 mL) CONCENTRATED injection DIAL TO MAX 100 UNITS BEFORE BREAKFAST, LUNCH AND DINNER. INJECT 30 MINUTES BEFORE MEALS. MAX OF 300 UNITS DAILY. (Patient taking differently: Per sliding scale) 18 mL 11   ??? losartan (COZAAR) 25 MG tablet TAKE ONE TABLET BY MOUTH EVERY DAY 90 tablet 3   ??? magnesium chloride (SLOW_MAG) 64 mg TbEC Take 128 mg by mouth three (3) times a day (at 6am, noon and 6pm). (2 tablets) 9am 1 pm and 9 pm     ??? metoprolol succinate (TOPROL-XL) 100 MG 24 hr tablet TAKE ONE TABLET TWICE DAILY 180 tablet 1   ??? miscellaneous medical supply Misc 1 application by Miscellaneous route daily as needed. Lightweight wheelchair. 1 each 0   ??? miscellaneous medical supply Misc 1 application by Miscellaneous route nightly. Recommend CPAP 14 cm H2O with EPR 3 and heated humidifier for nasal dryness, mask: ResMed Activa Lt nasal mask (Large). 1 each 0   ??? multivitamin capsule Take 1 capsule by mouth daily.     ??? MYFORTIC 180 mg EC tablet Take 3 tablets (540 mg total) by mouth two (2) times a day. 540 tablet 3   ??? pen needle, diabetic (BD ULTRA-FINE NANO PEN NEEDLE) 32 gauge x 5/32 (4 mm) Ndle 1 each by Other route Three (3) times a day before meals. 300 each 3   ??? pravastatin (PRAVACHOL) 20 MG tablet TAKE ONE TABLET BY MOUTH EVERY DAY 90 tablet 3   ??? tacrolimus (PROGRAF) 1 MG capsule Take 3 capsules (3 mg total) by mouth in the morning AND 2 capsules (2 mg total) in the evening. 450 capsule 3   ??? TRULICITY 1.5 mg/0.5 mL PnIj INJECT 1.5MG (0.5ML) SUBCUTANEOUSLY ONCE A WEEK 2 mL 11   ??? warfarin (COUMADIN) 2.5 MG tablet Take 1 tablet (2.5 mg total) by mouth daily. (Patient taking differently: Take 2.5 mg by mouth once a week. On Mondays 2.5mg  PO 5 mg QOD) 30 tablet 11   ??? warfarin (JANTOVEN) 5 MG tablet Take 1 tablet (5 mg total) by mouth daily with evening meal. TAKE 6 DAYS A WEEK - Tuesday through Sunday 90 tablet 3     No current facility-administered medications for this visit.     Facility-Administered Medications Ordered in Other Visits   Medication Dose Route Frequency Provider Last  Rate Last Admin   ??? bevacizumab    PRN (once a day) Melvyn Novas, MD   2.5 mg at 01/28/17 0750        Changes to medications: [place] reports no changes at this time.    Allergies   Allergen Reactions   ??? Levofloxacin Other (See Comments)     Other reaction(s): Unknown  Unknown   ??? Lidocaine Other (See Comments)     I don't remember States is fine with Bupivicaine   ??? Penicillins      As child  Other reaction(s): UNKNOWN.   (UPDATE: Tolerated amp/sulbactam without side effects during 10/2014 hospitalization)   ??? Egg Derived Rash   ??? Enalapril Cough   ??? Epinephrine Palpitations   ??? Grass Pollen-Bermuda, Standard Itching   ??? Lisinopril Rash   ??? Mepivacaine Hcl Palpitations       Changes to allergies: No    SPECIALTY MEDICATION ADHERENCE     Myfortic 180mg   : 12 days of medicine on hand   Tacrolimus 1mg   : 12 days of medicine on hand       Medication Adherence    Patient reported X missed doses in the last month: 0  Specialty Medication: myfortic 180mg   Patient is on additional specialty medications: Yes  Additional Specialty Medications: Tacrolimus 1mg   Patient Reported Additional Medication X Missed Doses in the Last Month: 0  Adherence tools used: patient uses a pill box to manage medications  Support network for adherence: family member          Specialty medication(s) dose(s) confirmed: Regimen is correct and unchanged.     Are there any concerns with adherence? No    Adherence counseling provided? Not needed    CLINICAL MANAGEMENT AND INTERVENTION      Clinical Benefit Assessment:    Do you feel the medicine is effective or helping your condition? Yes    Clinical Benefit counseling provided? Not needed    Adverse Effects Assessment:    Are you experiencing any side effects? No    Are you experiencing difficulty administering your medicine? No    Quality of Life Assessment:         How many days over the past month did your transplant  keep you from your normal activities? For example, brushing your teeth or getting up in the morning. 0    Have you discussed this with your provider? Not needed    Acute Infection Status:    Acute infections noted within Epic:  MDR Acinetobacter, MRSA  Patient reported infection: None    Therapy Appropriateness:    Is therapy appropriate and patient progressing towards therapeutic goals? Yes, therapy is appropriate and should be continued    DISEASE/MEDICATION-SPECIFIC INFORMATION      N/A    PATIENT SPECIFIC NEEDS     - Does the patient have any physical, cognitive, or cultural barriers? No    - Is the patient high risk? Yes, patient is taking a REMS drug. Medication is dispensed in compliance with REMS program    - Does the patient require a Care Management Plan? No     - Does the patient require physician intervention or other additional services (i.e. nutrition, smoking cessation, social work)? No      SHIPPING     Specialty Medication(s) to be Shipped:   Transplant: Myfortic 180mg  and tacrolimus 1mg     Other medication(s) to be shipped: No additional medications requested for fill at this time  Changes to insurance: No    Delivery Scheduled: Yes, Expected medication delivery date: 05/11/2021.  However, Rx request for refills was sent to the provider as there are none remaining.     Medication will be delivered via Next Day Courier to the confirmed prescription address in [facility].    The patient will receive a drug information handout for each medication shipped and additional FDA Medication Guides as required.  Verified that patient has previously received a Conservation officer, historic buildings and a Surveyor, mining.    The patient or caregiver noted above participated in the development of this care plan and knows that they can request review of or adjustments to the care plan at any time.      All of the patient's questions and concerns have been addressed.    [name]   [facility] Specialty Pharmacist

## 2021-05-09 ENCOUNTER — Ambulatory Visit: Admit: 2021-05-09 | Discharge: 2021-05-10 | Payer: MEDICARE

## 2021-05-09 LAB — COMPREHENSIVE METABOLIC PANEL
ALBUMIN: 3.4 g/dL (ref 3.4–5.0)
ALKALINE PHOSPHATASE: 85 U/L (ref 46–116)
ALT (SGPT): 21 U/L (ref 10–49)
ANION GAP: 9 mmol/L (ref 5–14)
AST (SGOT): 25 U/L (ref <=34)
BILIRUBIN TOTAL: 0.9 mg/dL (ref 0.3–1.2)
BLOOD UREA NITROGEN: 26 mg/dL — ABNORMAL HIGH (ref 9–23)
BUN / CREAT RATIO: 19
CALCIUM: 9 mg/dL (ref 8.7–10.4)
CHLORIDE: 101 mmol/L (ref 98–107)
CO2: 27.3 mmol/L (ref 20.0–31.0)
CREATININE: 1.35 mg/dL — ABNORMAL HIGH
EGFR CKD-EPI (2021) MALE: 55 mL/min/{1.73_m2} — ABNORMAL LOW (ref >=60)
GLUCOSE RANDOM: 305 mg/dL — ABNORMAL HIGH (ref 70–179)
POTASSIUM: 4.8 mmol/L (ref 3.4–4.8)
PROTEIN TOTAL: 6.1 g/dL (ref 5.7–8.2)
SODIUM: 137 mmol/L (ref 135–145)

## 2021-05-09 LAB — CBC W/ AUTO DIFF
BASOPHILS ABSOLUTE COUNT: 0 10*9/L (ref 0.0–0.1)
BASOPHILS RELATIVE PERCENT: 0.5 %
EOSINOPHILS ABSOLUTE COUNT: 0.1 10*9/L (ref 0.0–0.5)
EOSINOPHILS RELATIVE PERCENT: 1 %
HEMATOCRIT: 44.4 % (ref 39.0–48.0)
HEMOGLOBIN: 14.9 g/dL (ref 12.9–16.5)
LYMPHOCYTES ABSOLUTE COUNT: 0.9 10*9/L — ABNORMAL LOW (ref 1.1–3.6)
LYMPHOCYTES RELATIVE PERCENT: 14.4 %
MEAN CORPUSCULAR HEMOGLOBIN CONC: 33.5 g/dL (ref 32.0–36.0)
MEAN CORPUSCULAR HEMOGLOBIN: 28.4 pg (ref 25.9–32.4)
MEAN CORPUSCULAR VOLUME: 84.7 fL (ref 77.6–95.7)
MEAN PLATELET VOLUME: 8.1 fL (ref 6.8–10.7)
MONOCYTES ABSOLUTE COUNT: 0.5 10*9/L (ref 0.3–0.8)
MONOCYTES RELATIVE PERCENT: 7.2 %
NEUTROPHILS ABSOLUTE COUNT: 4.8 10*9/L (ref 1.8–7.8)
NEUTROPHILS RELATIVE PERCENT: 76.9 %
NUCLEATED RED BLOOD CELLS: 0 /100{WBCs} (ref <=4)
PLATELET COUNT: 173 10*9/L (ref 150–450)
RED BLOOD CELL COUNT: 5.24 10*12/L (ref 4.26–5.60)
RED CELL DISTRIBUTION WIDTH: 13.9 % (ref 12.2–15.2)
WBC ADJUSTED: 6.3 10*9/L (ref 3.6–11.2)

## 2021-05-09 LAB — PROTIME-INR
INR: 2
PROTIME: 23.3 s — ABNORMAL HIGH (ref 9.8–12.8)

## 2021-05-09 LAB — PHOSPHORUS: PHOSPHORUS: 3.3 mg/dL (ref 2.4–5.1)

## 2021-05-10 LAB — TACROLIMUS LEVEL, TIMED: TACROLIMUS BLOOD: 17 ng/mL

## 2021-05-10 MED FILL — MYFORTIC 180 MG TABLET,DELAYED RELEASE: ORAL | 30 days supply | Qty: 180 | Fill #5

## 2021-05-16 DIAGNOSIS — Z940 Kidney transplant status: Principal | ICD-10-CM

## 2021-05-17 NOTE — Unmapped (Signed)
Tac trough was high last week, but David Ellis thinks it was a true trough. Repeating this Thursday.

## 2021-05-18 ENCOUNTER — Ambulatory Visit: Admit: 2021-05-18 | Discharge: 2021-05-19 | Payer: MEDICARE

## 2021-05-18 LAB — TACROLIMUS LEVEL, TIMED: TACROLIMUS BLOOD: 4.8 ng/mL

## 2021-05-18 LAB — BASIC METABOLIC PANEL
ANION GAP: 7 mmol/L (ref 5–14)
BLOOD UREA NITROGEN: 20 mg/dL (ref 9–23)
BUN / CREAT RATIO: 17
CALCIUM: 9.1 mg/dL (ref 8.7–10.4)
CHLORIDE: 102 mmol/L (ref 98–107)
CO2: 28.5 mmol/L (ref 20.0–31.0)
CREATININE: 1.21 mg/dL — ABNORMAL HIGH
EGFR CKD-EPI (2021) MALE: 63 mL/min/{1.73_m2} (ref >=60)
GLUCOSE RANDOM: 378 mg/dL — ABNORMAL HIGH (ref 70–179)
POTASSIUM: 4.8 mmol/L (ref 3.4–4.8)
SODIUM: 137 mmol/L (ref 135–145)

## 2021-06-01 NOTE — Unmapped (Signed)
[facility] Refill Coordination Note    Specialty Medication(s) to be Shipped:   Transplant: Myfortic 180mg  and tacrolimus 1mg     Other medication(s) to be shipped: No additional medications requested for fill at this time     David Ellis, DOB: [birth year]  Phone: [phone] (home)       All above HIPAA information was verified with patient.     Was a Nurse, learning disability used for this call? No    Completed refill call assessment today to schedule patient's medication shipment from the [facility] [phone]).  All relevant notes have been reviewed.     Specialty medication(s) and dose(s) confirmed: Regimen is correct and unchanged.   Changes to medications: David Ellis reports no changes at this time.  Changes to insurance: No  New side effects reported not previously addressed with a pharmacist or physician: None reported  Questions for the pharmacist: No    Confirmed patient received a Conservation officer, historic buildings and a Surveyor, mining with first shipment. The patient will receive a drug information handout for each medication shipped and additional FDA Medication Guides as required.       DISEASE/MEDICATION-SPECIFIC INFORMATION        N/A    SPECIALTY MEDICATION ADHERENCE     Medication Adherence    Patient reported X missed doses in the last month: 0  Specialty Medication: Myfortic 180mg   Patient is on additional specialty medications: Yes  Additional Specialty Medications: Tacrolimus 1mg   Patient Reported Additional Medication X Missed Doses in the Last Month: 0  Patient is on more than two specialty medications: No  Adherence tools used: patient uses a pill box to manage medications  Support network for adherence: family member        Were doses missed due to medication being on hold? No    Myfortic 180 mg: 10 days of medicine on hand   Tacrolimus 1 mg: 10 days of medicine on hand     REFERRAL TO PHARMACIST     Referral to the pharmacist: Not needed      [facility]     Shipping address confirmed in Epic.     Delivery Scheduled: Yes, Expected medication delivery date: 06/08/2020.     Medication will be delivered via Next Day Courier to the prescription address in Epic WAM.    [name]   [facility] Specialty Technician

## 2021-06-06 DIAGNOSIS — Z79899 Other long term (current) drug therapy: Principal | ICD-10-CM

## 2021-06-06 DIAGNOSIS — Z1159 Encounter for screening for other viral diseases: Principal | ICD-10-CM

## 2021-06-06 DIAGNOSIS — Z940 Kidney transplant status: Principal | ICD-10-CM

## 2021-06-07 MED FILL — TACROLIMUS 1 MG CAPSULE, IMMEDIATE-RELEASE: ORAL | 30 days supply | Qty: 150 | Fill #1

## 2021-06-07 MED FILL — MYFORTIC 180 MG TABLET,DELAYED RELEASE: ORAL | 30 days supply | Qty: 180 | Fill #6

## 2021-06-08 ENCOUNTER — Ambulatory Visit: Admit: 2021-06-08 | Discharge: 2021-06-09 | Payer: MEDICARE

## 2021-06-08 LAB — CBC W/ AUTO DIFF
BASOPHILS ABSOLUTE COUNT: 0 10*9/L (ref 0.0–0.1)
BASOPHILS RELATIVE PERCENT: 0.6 %
EOSINOPHILS ABSOLUTE COUNT: 0.1 10*9/L (ref 0.0–0.5)
EOSINOPHILS RELATIVE PERCENT: 1.6 %
HEMATOCRIT: 43.6 % (ref 39.0–48.0)
HEMOGLOBIN: 14.8 g/dL (ref 12.9–16.5)
LYMPHOCYTES ABSOLUTE COUNT: 1 10*9/L — ABNORMAL LOW (ref 1.1–3.6)
LYMPHOCYTES RELATIVE PERCENT: 13.2 %
MEAN CORPUSCULAR HEMOGLOBIN CONC: 34 g/dL (ref 32.0–36.0)
MEAN CORPUSCULAR HEMOGLOBIN: 28.7 pg (ref 25.9–32.4)
MEAN CORPUSCULAR VOLUME: 84.4 fL (ref 77.6–95.7)
MEAN PLATELET VOLUME: 7.7 fL (ref 6.8–10.7)
MONOCYTES ABSOLUTE COUNT: 0.5 10*9/L (ref 0.3–0.8)
MONOCYTES RELATIVE PERCENT: 7.3 %
NEUTROPHILS ABSOLUTE COUNT: 5.6 10*9/L (ref 1.8–7.8)
NEUTROPHILS RELATIVE PERCENT: 77.3 %
NUCLEATED RED BLOOD CELLS: 0 /100{WBCs} (ref <=4)
PLATELET COUNT: 204 10*9/L (ref 150–450)
RED BLOOD CELL COUNT: 5.17 10*12/L (ref 4.26–5.60)
RED CELL DISTRIBUTION WIDTH: 13.7 % (ref 12.2–15.2)
WBC ADJUSTED: 7.3 10*9/L (ref 3.6–11.2)

## 2021-06-08 LAB — BASIC METABOLIC PANEL
ANION GAP: 7 mmol/L (ref 5–14)
BLOOD UREA NITROGEN: 27 mg/dL — ABNORMAL HIGH (ref 9–23)
BUN / CREAT RATIO: 19
CALCIUM: 9.4 mg/dL (ref 8.7–10.4)
CHLORIDE: 104 mmol/L (ref 98–107)
CO2: 27.1 mmol/L (ref 20.0–31.0)
CREATININE: 1.39 mg/dL — ABNORMAL HIGH
EGFR CKD-EPI (2021) MALE: 54 mL/min/{1.73_m2} — ABNORMAL LOW (ref >=60)
GLUCOSE RANDOM: 277 mg/dL — ABNORMAL HIGH (ref 70–99)
POTASSIUM: 5.3 mmol/L — ABNORMAL HIGH (ref 3.4–4.8)
SODIUM: 138 mmol/L (ref 135–145)

## 2021-06-08 LAB — PROTIME-INR
INR: 2.23
PROTIME: 26.1 s — ABNORMAL HIGH (ref 9.8–12.8)

## 2021-06-08 LAB — LIPID PANEL
CHOLESTEROL/HDL RATIO SCREEN: 3.5 (ref 1.0–4.5)
CHOLESTEROL: 135 mg/dL (ref <=200)
HDL CHOLESTEROL: 39 mg/dL — ABNORMAL LOW (ref 40–60)
LDL CHOLESTEROL CALCULATED: 65 mg/dL (ref 40–99)
NON-HDL CHOLESTEROL: 96 mg/dL (ref 70–130)
TRIGLYCERIDES: 155 mg/dL — ABNORMAL HIGH (ref 0–150)
VLDL CHOLESTEROL CAL: 31 mg/dL (ref 12–42)

## 2021-06-08 LAB — MAGNESIUM: MAGNESIUM: 2 mg/dL (ref 1.6–2.6)

## 2021-06-08 LAB — TACROLIMUS LEVEL: TACROLIMUS BLOOD: 5.5 ng/mL

## 2021-06-08 LAB — HEMOGLOBIN A1C
ESTIMATED AVERAGE GLUCOSE: 212 mg/dL
HEMOGLOBIN A1C: 9 % — ABNORMAL HIGH (ref 4.8–5.6)

## 2021-06-08 LAB — PHOSPHORUS: PHOSPHORUS: 2.8 mg/dL (ref 2.4–5.1)

## 2021-06-14 MED ORDER — GABAPENTIN 300 MG CAPSULE
ORAL_CAPSULE | 3 refills | 0 days | Status: CP
Start: 2021-06-14 — End: ?

## 2021-06-15 ENCOUNTER — Ambulatory Visit: Admit: 2021-06-15 | Discharge: 2021-06-16 | Payer: MEDICARE | Attending: Internal Medicine | Primary: Internal Medicine

## 2021-06-15 DIAGNOSIS — E785 Hyperlipidemia, unspecified: Principal | ICD-10-CM

## 2021-06-15 DIAGNOSIS — I10 Essential (primary) hypertension: Principal | ICD-10-CM

## 2021-06-15 DIAGNOSIS — R609 Edema, unspecified: Principal | ICD-10-CM

## 2021-06-15 DIAGNOSIS — I4891 Unspecified atrial fibrillation: Principal | ICD-10-CM

## 2021-06-15 NOTE — Unmapped (Signed)
WE discussed risks and benefits of adjusting pravastatin. As last cholesterol looks better, we will continue current dose and focus on healthy lifestyle habits. Follow-up with endocrine about the A1C.

## 2021-06-15 NOTE — Unmapped (Signed)
Assessment and Plan:   David Ellis is a 76 y.o. 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    1. Atrial fibrillation  Overall doing very well.  Zio patch 02/2019 without recurrent atrial fibrillation.  He is regular on examination today.  Plan to continue metoprolol at current dose.  Previously reviewed anticoagulation options and he has preferred to stay on warfarin.  Currently tolerating this well, continue current therapy.  INR managed through Dr. Dellis Filbert.    2. Hypertension  Blood pressure at goal.  Reviewed labs earlier this month, CBC and BMP unremarkable.  Continue current regimen.    3. Hyperlipidemia  Reviewed recent lipids 06/2021 which are at goal with non-HDL less than 100.  However, previous values have been slightly above goal.  Briefly discussed risks and benefits of uptitrating pravastatin.  We are using this and lieu of high intensity statin due to favorable drug drug interactions with transplant regimen and also the fact that has controlled his cholesterol well.  At this point, we elected to continue current dose of pravastatin.  Discussed focusing on healthy lifestyle habits.  Should lipids trend back upwards, would have low threshold to uptitrate to 40 mg daily.    4.  Edema  Generally doing well, reports using additional furosemide approximately once per month. TTE 3/17 with normal ejection fraction.  Continue current regimen.      Vevelyn Francois, MD  [facility]--Cardiology  Office [phone]  Office [phone]      Subjective:   PCP: [name], MD  Patient: David Ellis  DOB: [birth year]    Reason for visit:  Afib, HTN, RV failure  HPI: [name] is a 76 y.o. 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  Since his last visit, he overall reports feeling very well from the cardiac standpoint.  Denies any chest pain, palpitations, or unusual shortness of breath.  Unfortunately, his balance is still off.  He is unable to stand over a golf ball without losing balance.  Otherwise reports swelling generally stable.  He uses additional furosemide approximately once per month, plans to do so this week as legs feel somewhat tighter.  He denies any other pertinent complaints today.    ______________________________________________________________________  Pertinent Medical History, Cardiovascular History & Procedures:    Pertinent PMH:   ?? AFib  ?? Typical atrial flutter s/p ablation 06/2011  ?? DM  ?? 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 ESRD s/p transplant  ?? HTN  ?? OSA on CPAP  ?? Skin CA  ?? PE s/p catheter directed lytics 05/2013  ?? 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 Wedge defects in left lung without any thrombus visualized, could represent chronic changes or small subsegmental PEs.    Risk Factors: HTN, DM     Cath / PCI:   ?? None    CV Surgery:   ?? None    EP Procedures and Devices:   ?? None    Non-Invasive Evaluation(s):   ?? Ziopatch 9/20: Predominantly sinus rhythm with rates 57 to 100 bpm, average heart rate 75 bpm.  71  episodes of SVT, longest lasting 17 beats at 91 bpm.  Rare PVCs, brief idioventricular rhythm noted.  No wide-complex tachycardia 2.5% PVC burden.  ?? TTE 3/17: Normal EF, LVH, grade 1 diastolic dysfunction  ?? Ziopatch 2/16: Rare SVE and VE. 29 episodes of SVT, longest episode lasting 20 beats. No atrial fibrillation identified.  ?? TTE from 06/30/13: Normal EF 55-60%, mildly dilated RV, mild RV dysfunction.  ?? Nuclear stress test 07/2011:  ?? Normal myocardial perfusion study  ?? No evidence for significant ischemia or scar EF 62% and RV is mildly dilated.  ?? Minimal coronary calcification is noted involving the LAD and RCA    ______________________________________________________________________    Other past medical history, social history, family history, medications, allergies and problem list reviewed in the medical record.    Current cardiac medications include:   ?? Metoprolol 100 mg twice daily  ?? 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 Furosemide 40 mg once daily with additional dose as needed  ?? Warfarin  ?? Pravastatin 20 mg daily  ?? Losartan 25 mg daily      Objective:     BP 110/78 (BP Site: R Arm, BP Position: Sitting, BP Cuff Size: Small)  - Pulse 74  - Temp 36.1 ??C (96.9 ??F) (Temporal)  - Wt (!) 114.3 kg (252 lb)  - SpO2 98%  - BMI 33.25 kg/m??     PHYSICAL EXAMINATION:   GENERAL:  Alert, NAD  ENT: Wearing a mask  CARDIOVASCULAR:  Regular rate and rhythm, soft S1/S2, no murmurs, rubs, or gallops.  1+ L>R LE lower extremity edema.  RESPIRATORY:  Clear to auscultation bilaterally.  No wheezes, crackles, or rhonchi. Normal work of breathing.      ______________________________________________________________________  EKG 12/2020 shows normal sinus rhythm, left axis deviation, first-degree AV block, poor wave progression    Recent CV pertinent labs:  Lab Results   Component Value Date    LDL Calculated 65 06/08/2021    LDL Calculated 74 04/21/2015    Non-HDL Cholesterol 96 06/08/2021    HDL 39 (L) 06/08/2021    HDL 36 (L) 04/21/2015    INR, POC 3.09 (A) 05/19/2019    INR 2.23 06/08/2021    INR 1.9 (A) 06/02/2019    PRO-BNP 375 (H) 06/29/2014    Creatinine 1.39 (H) 06/08/2021    Creatinine 1.7 (H) 07/09/2017    Creatinine 1.36 08/09/2014    Potassium 5.3 (H) 06/08/2021    Potassium 4.6 07/09/2017    BUN 27 (H) 06/08/2021    BUN 49.00 (H) 07/09/2017    TSH 2.202 07/02/2017    TSH 1.20 07/02/2011       Chemistry        Component Value Date/Time    NA 138 06/08/2021 0813    NA 139 07/09/2017 1358    K 5.3 (H) 06/08/2021 0813    K 4.6 07/09/2017 1358    CL 104 06/08/2021 0813    CL 104 07/09/2017 1358    CO2 27.1 06/08/2021 0813    CO2 24.2 07/09/2017 1358    BUN 27 (H) 06/08/2021 0813    BUN 49.00 (H) 07/09/2017 1358    CREATININE 1.39 (H) 06/08/2021 0813    CREATININE 1.7 (H) 07/09/2017 1358    GLU 277 (H) 06/08/2021 0813        Component Value Date/Time    CALCIUM 9.4 06/08/2021 0813    CALCIUM 8.9 07/09/2017 1358    ALKPHOS 85 05/09/2021  0901    ALKPHOS 91 04/21/2015 0858    AST 25 05/09/2021 0901    AST 24 04/21/2015 0858    ALT 21 05/09/2021 0901    ALT 22 04/21/2015 0858    BILITOT 0.9 05/09/2021 0901    BILITOT 0.5 04/21/2015 1610

## 2021-06-28 NOTE — Unmapped (Signed)
[facility] Refill Coordination Note    Specialty Medication(s) to be Shipped:   Transplant: Myfortic 180mg  and tacrolimus 1mg     Other medication(s) to be shipped: No additional medications requested for fill at this time     David Ellis, DOB: 19-May-1946  Phone: [phone] (home)       All above HIPAA information was verified with patient.     Was a Nurse, learning disability used for this call? No    Completed refill call assessment today to schedule patient's medication shipment from the [facility] [phone]).  All relevant notes have been reviewed.     Specialty medication(s) and dose(s) confirmed: Regimen is correct and unchanged.   Changes to medications: David Ellis reports no changes at this time.  Changes to insurance: No  New side effects reported not previously addressed with a pharmacist or physician: None reported  Questions for the pharmacist: No    Confirmed patient received a Conservation officer, historic buildings and a Surveyor, mining with first shipment. The patient will receive a drug information handout for each medication shipped and additional FDA Medication Guides as required.       DISEASE/MEDICATION-SPECIFIC INFORMATION        N/A    SPECIALTY MEDICATION ADHERENCE     Medication Adherence    Specialty Medication: Myfortic 180mg   Patient is on additional specialty medications: Yes  Additional Specialty Medications: Tacrolimus 1mg   Patient is on more than two specialty medications: No  Adherence tools used: patient uses a pill box to manage medications  Support network for adherence: family member        Were doses missed due to medication being on hold? No    Myfortic 180 mg: 11 days of medicine on hand   Tacrolimus 1 mg: 11 days of medicine on hand     REFERRAL TO PHARMACIST     Referral to the pharmacist: Not needed      SHIPPING     Shipping address confirmed in Epic.     Delivery Scheduled: Yes, Expected medication delivery date: 07/07/2021.     Medication will be delivered via Next Day Courier to the prescription address in Epic WAM.    David Ellis   [facility] Specialty Technician

## 2021-07-04 DIAGNOSIS — Z125 Encounter for screening for malignant neoplasm of prostate: Principal | ICD-10-CM

## 2021-07-06 ENCOUNTER — Ambulatory Visit: Admit: 2021-07-06 | Discharge: 2021-07-07 | Payer: MEDICARE

## 2021-07-06 LAB — CBC W/ AUTO DIFF
BASOPHILS ABSOLUTE COUNT: 0 10*9/L (ref 0.0–0.1)
BASOPHILS RELATIVE PERCENT: 0.7 %
EOSINOPHILS ABSOLUTE COUNT: 0.1 10*9/L (ref 0.0–0.5)
EOSINOPHILS RELATIVE PERCENT: 2.1 %
HEMATOCRIT: 43.8 % (ref 39.0–48.0)
HEMOGLOBIN: 14.6 g/dL (ref 12.9–16.5)
LYMPHOCYTES ABSOLUTE COUNT: 1.4 10*9/L (ref 1.1–3.6)
LYMPHOCYTES RELATIVE PERCENT: 22.9 %
MEAN CORPUSCULAR HEMOGLOBIN CONC: 33.4 g/dL (ref 32.0–36.0)
MEAN CORPUSCULAR HEMOGLOBIN: 28.3 pg (ref 25.9–32.4)
MEAN CORPUSCULAR VOLUME: 84.8 fL (ref 77.6–95.7)
MEAN PLATELET VOLUME: 8 fL (ref 6.8–10.7)
MONOCYTES ABSOLUTE COUNT: 0.5 10*9/L (ref 0.3–0.8)
MONOCYTES RELATIVE PERCENT: 8.3 %
NEUTROPHILS ABSOLUTE COUNT: 4.2 10*9/L (ref 1.8–7.8)
NEUTROPHILS RELATIVE PERCENT: 66 %
NUCLEATED RED BLOOD CELLS: 0 /100{WBCs} (ref <=4)
PLATELET COUNT: 191 10*9/L (ref 150–450)
RED BLOOD CELL COUNT: 5.16 10*12/L (ref 4.26–5.60)
RED CELL DISTRIBUTION WIDTH: 13.8 % (ref 12.2–15.2)
WBC ADJUSTED: 6.3 10*9/L (ref 3.6–11.2)

## 2021-07-06 LAB — BASIC METABOLIC PANEL
ANION GAP: 5 mmol/L (ref 5–14)
BLOOD UREA NITROGEN: 26 mg/dL — ABNORMAL HIGH (ref 9–23)
BUN / CREAT RATIO: 20
CALCIUM: 9.1 mg/dL (ref 8.7–10.4)
CHLORIDE: 103 mmol/L (ref 98–107)
CO2: 30.8 mmol/L (ref 20.0–31.0)
CREATININE: 1.27 mg/dL — ABNORMAL HIGH
EGFR CKD-EPI (2021) MALE: 60 mL/min/{1.73_m2} (ref >=60)
GLUCOSE RANDOM: 240 mg/dL — ABNORMAL HIGH (ref 70–179)
POTASSIUM: 4.5 mmol/L (ref 3.5–5.1)
SODIUM: 139 mmol/L (ref 135–145)

## 2021-07-06 LAB — PSA: PROSTATE SPECIFIC ANTIGEN: 0.99 ng/mL (ref 0.00–4.00)

## 2021-07-06 LAB — MAGNESIUM: MAGNESIUM: 1.9 mg/dL (ref 1.6–2.6)

## 2021-07-06 LAB — PROTIME-INR
INR: 2.18
PROTIME: 25.5 s — ABNORMAL HIGH (ref 9.8–12.8)

## 2021-07-06 LAB — PHOSPHORUS: PHOSPHORUS: 3.5 mg/dL (ref 2.4–5.1)

## 2021-07-06 LAB — TACROLIMUS LEVEL: TACROLIMUS BLOOD: 4.8 ng/mL

## 2021-07-06 MED FILL — TACROLIMUS 1 MG CAPSULE, IMMEDIATE-RELEASE: ORAL | 30 days supply | Qty: 150 | Fill #2

## 2021-07-06 MED FILL — MYFORTIC 180 MG TABLET,DELAYED RELEASE: ORAL | 30 days supply | Qty: 180 | Fill #7

## 2021-07-13 MED ORDER — WARFARIN 2.5 MG TABLET
ORAL_TABLET | 11 refills | 0 days
Start: 2021-07-13 — End: ?

## 2021-07-14 MED ORDER — WARFARIN 2.5 MG TABLET
ORAL_TABLET | 11 refills | 0 days | Status: CP
Start: 2021-07-14 — End: ?

## 2021-07-14 NOTE — Unmapped (Signed)
Complex Case Management  SUMMARY NOTE    Attempted to contact pt today at Cell number to introduce Complex Case Management services. Left message to return call.; 1st attempt    Discuss at next visit: Introduction to Complex Case Management

## 2021-07-16 DIAGNOSIS — Z1159 Encounter for screening for other viral diseases: Principal | ICD-10-CM

## 2021-07-16 DIAGNOSIS — N189 Chronic kidney disease, unspecified: Principal | ICD-10-CM

## 2021-07-16 DIAGNOSIS — Z940 Kidney transplant status: Principal | ICD-10-CM

## 2021-07-16 DIAGNOSIS — D631 Anemia in chronic kidney disease: Principal | ICD-10-CM

## 2021-07-16 DIAGNOSIS — Z79899 Other long term (current) drug therapy: Principal | ICD-10-CM

## 2021-07-18 ENCOUNTER — Ambulatory Visit: Admit: 2021-07-18 | Discharge: 2021-07-19 | Payer: MEDICARE

## 2021-07-18 NOTE — Unmapped (Signed)
REmaisn on coumadin; will set up INR next week when he gest his renal transplant labs drawn.   NO bleeding

## 2021-07-18 NOTE — Unmapped (Signed)
Remains on coumadin 

## 2021-07-18 NOTE — Unmapped (Addendum)
REports last colonoscpoy 2013; repeated in 5 years was not done due to risk of coumadin/lovenox, tight control of DM.  I think that since things are stable, will set up colonoscopy  WIll need to address coumadin/lovenox and wil need to address insulin at that point.  H'ell set up appointment with me 2-3 weeks prior to colonoscpoy in the future.

## 2021-07-18 NOTE — Unmapped (Signed)
Seeing renal next week.  Will get transplant labs then.

## 2021-07-18 NOTE — Unmapped (Signed)
Colonoscopy  Procedure #1      0  Procedure #2      [id number]  MRN      0  Endoscopist      FALSE  Is the patient's health insurance [address], [place] [facility]), or [employer]?      FALSE  Urgent procedure      TRUE  Do you take: Plavix (clopidogrel), Coumadin (warfarin), Lovenox (enoxaparin), Pradaxa (dabigatran), Effient (prasugrel), Xarelto (rivaroxaban), Eliquis (apixaban), Pletal (cilostazol), or Brilinta (ticagrelor)?    FALSE  Do you have hemophilia, von Willebrand disease, thrombocytopenia?      FALSE  Do you have a pacemaker or implanted cardiac defibrillator?      FALSE  Are you pregnant?      FALSE  Has a [place] [facility] provider specified the location(s)?        Which location(s) did the [facility] provider specify?      FALSE     [facility]      FALSE     [place]      FALSE     [facility]      FALSE     [facility]      FALSE  Is procedure indication for variceal banding (this does NOT include variceal screening)?              6  Height (feet)      1  Height (inches)      250  Weight (pounds)      33.0  BMI              FALSE  Did the ordering provider specify a bowel prep?      0       What bowel prep was specified?      TRUE  Do you have chronic kidney disease?      FALSE  Do you have chronic constipation or have you had poor quality bowel preps for past colonoscopies?      FALSE  Do you have Crohn's disease or ulcerative colitis?      FALSE  Have you had weight loss surgery?              FALSE  Are you in the process of scheduling or awaiting results of a heart ultrasound, stress test, or catheterization to evaluate new or worsening chest pain, dizziness, or shortness of breath?     FALSE  When you walk around your house or grocery store, do you have to stop and rest due to shortness of breath, chest pain, or light-headedness?      FALSE  Have you had a heart attack, stroke or heart stent placement within the past 6 months?      FALSE  Do you ever use supplemental oxygen? FALSE  Have you been hospitalized for cirrhosis of the liver or heart failure in the last 12 months?      FALSE  Have you been treated for mouth or throat cancer with radiation or surgery?      FALSE  Have you been told that it is difficult for doctors to insert a breathing tube in you during anesthesia?      FALSE  Have you had a heart or lung transplant?              FALSE  Are you on dialysis?      FALSE  Do you have cirrhosis of the liver?      FALSE  Do you have myasthenia gravis?      FALSE  Is the patient a prisoner?              TRUE  Have you been diagnosed with sleep apnea or do you wear a CPAP machine at night?      FALSE  Are you younger than 30?      FALSE  Have you previously received propofol sedation administered by an anesthesiologist for a GI procedure?      FALSE  Do you drink an average of more than 3 drinks of alcohol per day?      FALSE  Do you regularly take suboxone or any prescription medications for chronic pain?      FALSE  Do you regularly take Ativan, Klonopin, Xanax, Valium, lorazepam, clonazepam, alprazolam, or diazepam?      FALSE  Have you previously had difficulty with sedation during a GI procedure?      FALSE  Have you been diagnosed with PTSD?      FALSE  Are you allergic to fentanyl or midazolam (Versed)?      FALSE  Do you take medications for HIV?      ################### ################################################################################################################### ################ ############### ###############   MRN:          [id number]      Anticoag Review:  Yes      Nurse Triage:  No      GI Clinic Consult:  No      Procedure(s):  Colonoscopy 0     Location[facility]s):  [facility]  [place]      Endoscopist:  0      Urgent:            No       Prep:               Nulytely Prep              ################### ################################################################################################################### ################ ############### ###############

## 2021-07-18 NOTE — Unmapped (Signed)
Patient ID: David Ellis is a 76 y.o. male who presents for eval of medical issues.      Assessment/Plan:        Routine general medical examination at a health care facility  REports last colonoscpoy 2013; repeated in 5 years was not done due to risk of coumadin/lovenox, tight control of DM.  I think that since things are stable, will set up colonoscopy  WIll need to address coumadin/lovenox and wil need to address insulin at that point.  H'ell set up appointment with me 2-3 weeks prior to colonoscpoy in the future.    Atrial fibrillation (CMS-HCC)  REmaisn on coumadin; will set up INR next week when he gest his renal transplant labs drawn.   NO bleeding    Essential (primary) hypertension  BP well controlled on current medical therapy.  NO changes.  Check creatinine next week.    Other pulmonary embolism without acute cor pulmonale (CMS-HCC)  Remains on coumadin    Transplanted kidney  Seeing renal next week.  Will get transplant labs then.      Orders Placed This Encounter   Procedures   ??? Comprehensive Metabolic Panel   ??? Lipid Panel   ??? CBC w/ Differential   ??? Hemoglobin A1c   ??? PT-INR   ??? PT-INR   ??? Colonoscopy       Medication adherence and barriers to the treatment plan have been addressed. Opportunities to optimize healthy behaviors have been discussed. Patient / caregiver voiced understanding.       Return in about 3 months (around 10/15/2021).     Subjective:     Current Health Status  Patient Active Problem List    Diagnosis Date Noted   ??? Diabetic ulcer of left foot associated with type 2 diabetes mellitus (CMS-HCC) 07/03/2014   ??? OSA on CPAP 02/17/2014   ??? Routine general medical examination at a health care facility 11/17/2013   ??? Atrial fibrillation (CMS-HCC) 07/06/2013   ??? Right ventricular dysfunction 06/30/2013   ??? Other pulmonary embolism without acute cor pulmonale (CMS-HCC) 05/23/2013   ??? Transplanted kidney    ??? Mixed hyperlipidemia 10/30/2011   ??? Type II diabetes mellitus with renal manifestations, uncontrolled 03/26/2011   ??? Essential (primary) hypertension 10/10/2010   ??? Syncope 02/03/2019   ??? Immunosuppressed status (CMS-HCC) 09/02/2018   ??? Class 2 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 01/01/2018   ??? Hypoglycemia associated with type 2 diabetes mellitus (CMS-HCC) 09/03/2016   ??? BCC (basal cell carcinoma), face 07/18/2015   ??? Left knee pain 11/09/2014   ??? 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 Acquired absence of other toe(s), unspecified side (CMS-HCC) 08/10/2014   ??? Type 2 diabetes mellitus with foot ulcer (CODE) (CMS-HCC) 07/03/2014   ??? Diabetic polyneuropathy associated with type 2 diabetes mellitus (CMS-HCC) 03/15/2014   ??? Aftercare following organ transplant 08/27/2013   ??? Edema 06/30/2013   ??? 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 History of kidney transplant 10/30/2011   ??? 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 Diverticulosis of colon 03/09/2011   ??? History of colonic polyps 03/09/2011      Here today for eval of medical issues.    He reporst that his eyes are struggling a bit.  He is finding a way to read with a kindle large print.      Needs colonoscopy.   Reports bp at home in the 130/80 range or under.  Reports that blood sugars in the last few days has been in the 40s.      Allergies   Allergen Reactions   ??? Levofloxacin Other (See Comments)     Other reaction(s): Unknown  Unknown   ??? Lidocaine Other (See Comments)     I don't remember States is fine with Bupivicaine   ??? Penicillins      As child  Other reaction(s): UNKNOWN.   (UPDATE: Tolerated amp/sulbactam without side effects during 10/2014 hospitalization)   ??? Egg Derived Rash   ??? Enalapril Cough   ??? Epinephrine Palpitations   ??? Grass Pollen-[place], Standard Itching   ??? Lisinopril Rash   ??? Mepivacaine Hcl Palpitations       Current Outpatient Medications   Medication Sig Dispense Refill   ??? aspirin (ECOTRIN) 81 MG tablet Take 81 mg by mouth daily as needed (when flying).     ??? blood-glucose meter kit Use as instructed. 1 each 0   ??? cholecalciferol, vitamin D3-50 mcg, 2,000 unit,, 50 mcg (2,000 unit) tablet Take 2,000 Units by mouth daily.      ??? flash glucose sensor (FREESTYLE LIBRE 14 DAY SENSOR) kit by Other route every fourteen (14) days. 2 each 11   ??? furosemide (LASIX) 40 MG tablet Take 1-2 tablets daily as needed for edema. (Patient taking differently: 40 mg. 40 mg daily. And 1 additional tablet for edema.) 180 tablet 3   ??? gabapentin (NEURONTIN) 300 MG capsule TAKE 1 CAPSULE BY MOUTH TWICE DAILY AS DIRECTED (Patient taking differently: Take 300 mg by mouth two (2) times a day.) 180 capsule 3   ??? HUMULIN R U-500, CONC, KWIKPEN 500 unit/mL (3 mL) CONCENTRATED injection DIAL TO MAX 100 UNITS BEFORE BREAKFAST, LUNCH AND DINNER. INJECT 30 MINUTES BEFORE MEALS. MAX OF 300 UNITS DAILY. (Patient taking differently: Per sliding scale) 18 mL 11   ??? losartan (COZAAR) 25 MG tablet TAKE ONE TABLET BY MOUTH EVERY DAY 90 tablet 3   ??? magnesium chloride (SLOW_MAG) 64 mg TbEC Take 128 mg by mouth three (3) times a day (at 6am, noon and 6pm). (2 tablets) 9am 1 pm and 9 pm     ??? metoprolol succinate (TOPROL-XL) 100 MG 24 hr tablet TAKE ONE TABLET TWICE DAILY 180 tablet 1   ??? miscellaneous medical supply Misc 1 application by Miscellaneous route daily as needed. Lightweight wheelchair. 1 each 0   ??? miscellaneous medical supply Misc 1 application by Miscellaneous route nightly. Recommend CPAP 14 cm H2O with EPR 3 and heated humidifier for nasal dryness, mask: ResMed Activa Lt nasal mask (Large). 1 each 0   ??? multivitamin capsule Take 1 capsule by mouth daily.     ??? MYFORTIC 180 mg EC tablet Take 3 tablets (540 mg total) by mouth two (2) times a day. 540 tablet 3   ??? pen needle, diabetic (BD ULTRA-FINE NANO PEN NEEDLE) 32 gauge x 5/32 (4 mm) Ndle 1 each by Other route Three (3) times a day before meals. 300 each 3   ??? pravastatin (PRAVACHOL) 20 MG tablet TAKE ONE TABLET BY MOUTH EVERY DAY 90 tablet 3 ??? tacrolimus (PROGRAF) 1 MG capsule Take 3 capsules (3 mg total) by mouth in the morning AND 2 capsules (2 mg total) in the evening. 450 capsule 3   ??? TRULICITY 1.5 mg/0.5 mL PnIj INJECT 1.5MG (0.5ML) SUBCUTANEOUSLY ONCE A WEEK 2 mL 11   ??? warfarin (JANTOVEN) 2.5 MG tablet TAKE 1 TABLET BY MOUTH ONCE DAILY 30 tablet 11   ???  warfarin (JANTOVEN) 5 MG tablet Take 1 tablet (5 mg total) by mouth daily with evening meal. TAKE 6 DAYS A WEEK - Tuesday through Sunday 90 tablet 3     No current facility-administered medications for this visit.     Facility-Administered Medications Ordered in Other Visits   Medication Dose Route Frequency Provider Last Rate Last Admin   ??? bevacizumab    PRN (once a day) Melvyn Novas, MD   2.5 mg at 01/28/17 0750       Past Medical History:   Diagnosis Date   ??? Atrial flutter (CMS-HCC)    ??? Diabetes mellitus (CMS-HCC)    ??? Diabetic nephropathy (CMS-HCC)    ??? Diabetic retinopathy (CMS-HCC)    ??? Fractures    ??? Ganglion cyst    ??? Hand injury    ??? Heart disease    ??? Hyperlipidemia    ??? Hypertension    ??? Joint pain    ??? Osteomyelitis (CMS-HCC) June 2016   ??? Pulmonary embolism (CMS-HCC) Dec 2015   ??? 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 Stroke (CMS-HCC)    ??? Tear of meniscus of knee    ??? 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 NEPHRECTOMY TRANSPLANTED ORGAN Right     LURD - spouse received in 2013.   ??? PR AMPUTATION METATARSAL+TOE,SINGLE Left 07/06/2014    Procedure: AMPUTATION, METATARSAL, WITH TOE SINGLE;  Surgeon: Marion Downer, MD;  Location: MAIN OR [facility];  Service: Vascular   ??? PR AMPUTATION METATARSAL+TOE,SINGLE Left 10/28/2014    Procedure: AMPUTATION, METATARSAL, WITH TOE SINGLE;  Surgeon: Maple Mirza, MD;  Location: MAIN OR [facility];  Service: Vascular   ??? PR VITRECTOMY,PANRETINAL LASER RX Right 11/26/2016    Procedure: VITRECTOMY, MECHANICAL, PARS PLANA APPROACH; WITH ENDOLASER PANRETINAL PHOTOCOAGULATION;  Surgeon: Melvyn Novas, MD;  [facility];  Service: Ophthalmology   ??? PR VITRECTOMY,PANRETINAL LASER RX Left 01/28/2017    Procedure: VITRECTOMY, MECHANICAL, PARS PLANA APPROACH; WITH ENDOLASER PANRETINAL PHOTOCOAGULATION;  Surgeon: Melvyn Novas, MD;  [facility];  Service: Ophthalmology   ??? SKIN BIOPSY         Family History   Problem Relation Age of Onset   ??? Kidney disease Mother    ??? Diabetes Mother    ??? Kidney disease Father    ??? Kidney disease Maternal Grandmother    ??? Diabetes Maternal Grandmother    ??? Glaucoma Neg Hx    ??? Macular degeneration Neg Hx    ??? Strabismus Neg Hx        Social History     Tobacco Use   ??? Smoking status: Never   ??? Smokeless tobacco: Never   Substance Use Topics   ??? Alcohol use: No   ??? Drug use: No             Objective:       Vital Signs  BP 140/82  - Pulse 74  - Wt (!) 113.9 kg (251 lb)  - SpO2 97%  - BMI 33.12 kg/m??      Exam  Well appeaing male in nad

## 2021-07-18 NOTE — Unmapped (Signed)
Problem List Items Addressed This Visit          High    Atrial fibrillation (CMS-HCC) - Primary    Diabetic ulcer of left foot associated with type 2 diabetes mellitus (CMS-HCC)    Essential (primary) hypertension    Other pulmonary embolism without acute cor pulmonale (CMS-HCC)    Routine general medical examination at a health care facility     REports last colonoscpoy 2013; repeated in 5 years was not done due to risk of coumadin/lovenox, tight control of DM.          Other Visit Diagnoses       Abnormal finding of blood chemistry, unspecified        Relevant Orders    Comprehensive Metabolic Panel    Lipid Panel    CBC w/ Differential    Hemoglobin A1c    PT-INR    PT-INR    Abnormal coagulation profile        Relevant Orders    PT-INR    PT-INR

## 2021-07-18 NOTE — Unmapped (Signed)
BP well controlled on current medical therapy.  NO changes.  Check creatinine next week.

## 2021-07-18 NOTE — Unmapped (Signed)
Pt notified of electronic order placed and availability verified.

## 2021-07-19 NOTE — Unmapped (Signed)
Complex Case Management  SUMMARY NOTE    Attempted to contact pt today at Cell number to introduce Complex Case Management services. Left message to return call.; 2nd attempt    Discuss at next visit: No answer, letter sent

## 2021-07-27 ENCOUNTER — Ambulatory Visit: Admit: 2021-07-27 | Discharge: 2021-07-28 | Payer: MEDICARE

## 2021-07-27 ENCOUNTER — Ambulatory Visit: Admit: 2021-07-27 | Discharge: 2021-07-28 | Payer: MEDICARE | Attending: "Endocrinology | Primary: "Endocrinology

## 2021-07-27 DIAGNOSIS — E1129 Type 2 diabetes mellitus with other diabetic kidney complication: Principal | ICD-10-CM

## 2021-07-27 DIAGNOSIS — Z9181 History of falling: Principal | ICD-10-CM

## 2021-07-27 DIAGNOSIS — S91109A Unspecified open wound of unspecified toe(s) without damage to nail, initial encounter: Principal | ICD-10-CM

## 2021-07-27 MED ORDER — INSULIN ASPART (U-100) 100 UNIT/ML (3 ML) SUBCUTANEOUS PEN
12 refills | 0.00000 days | Status: CP
Start: 2021-07-27 — End: ?

## 2021-07-27 MED ORDER — INSULIN DEGLUDEC (U-200) 200 UNIT/ML (3 ML) SUBCUTANEOUS PEN
Freq: Every day | SUBCUTANEOUS | 12 refills | 75 days | Status: CP
Start: 2021-07-27 — End: ?

## 2021-07-27 NOTE — Unmapped (Addendum)
1. 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    Continue Trulicity 1.5mg  weekly.  No reason to increase dose with reduced appetite.    When you are out of u500:     Start once daily Tresiba 80units. If you have fasting glucoses <90, reduce by 5units at a time.   If your fasting glucoses > 150, we will need to discuss increasing your dose of Tresiba.    Start Novolog before meals.  I suspect you will need somewhere between 15-25units with meals.    I would like for you to meet with our educator to review before you change.     It will be easier to adjust insulin for colonoscopy on this regimen.      - insulin degludec 200 unit/mL (3 mL) InPn; Inject 0.4 mL (80 Units total) under the skin daily. Titrate up to 100units daily as instructed.  Dispense: 30 mL; Refill: 12  - insulin ASPART (NOVOLOG FLEXPEN U-100 INSULIN) 100 unit/mL (3 mL) injection pen; 15-25 units with meals + correction. Max 100units daily.  Dispense: 30 mL; Refill: 12    2. At high risk for falls  Per request of patient  - Ambulatory referral to Physical Therapy; Future    3. Non-healing open wound of toe, initial encounter  Return to Dr. Jacolyn Reedy w Staff message sent

## 2021-07-27 NOTE — Unmapped (Signed)
ASSESSMENT / PLAN  1. 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   Review of CGM data shows clear stacking of insulin during the day, and hyperglycemia overnight.      Now that you will have $35 cap for monthly insulin, would like to change u500 to a basal/bolus regimen using concentrated Tresiba u200 for basal [to decrease volume of injection], and either Novolog/Humalog pens for bolus.  This will allow more stable basal glucose control, and more intuitive adjustment of prandial/correction.    Continue Trulicity 1.5mg  weekly.  No reason to increase dose with reduced appetite.    When you are out of u500 insulin:     Start once daily [place] 80units. If you have fasting glucoses <90, reduce by 5units at a time.   If your fasting glucoses > 150, we will need to discuss increasing your dose of Tresiba.    Start Novolog before meals.  I suspect you will need somewhere between 15-25units with meals.    I would like for you to meet with our educator to review further and fine tune before you change.     It will be easier to adjust insulin for colonoscopy on this regimen.    - insulin degludec 200 unit/mL (3 mL) InPn; Inject 0.4 mL (80 Units total) under the skin daily. Titrate up to 100units daily as instructed.  Dispense: 30 mL; Refill: 12  - insulin ASPART (NOVOLOG FLEXPEN U-100 INSULIN) 100 unit/mL (3 mL) injection pen; 15-25 units with meals + correction. Max 100units daily.  Dispense: 30 mL; Refill: 12  - Ambulatory referral to Diabetic Education; Future    2. Non-healing open wound of toe, initial encounter  MyChart message sent to Dr. Jacolyn Reedy and referral placed:   Last saw 11/2020 but new wound on distal L 1st toe with diabetic neuropathy/hx amputation  - Ambulatory referral to Wound Clinic; Future    3. At high risk for falls  Referral placed at request of pt and wife  - Ambulatory referral to Physical Therapy; Future      MEDICARE:  *The patient is currently using the therapeutic CGM as prescribed.  *The patient has diabetes.  *The patient is insulin-treated with 3 or more daily administrations of insulin (MDII)  prior to use of a CGM.   *The patient???s insulin treatment regimen requires frequent adjustments by the patient based on sliding scale.  *Within the last six months, I had an in-person visit with the beneficiary to evaluate their diabetes control and determine that the above criteria are met.   *Every six months I will continue to have an in-person visit with the beneficiary to assess adherence to their CGM regimen and diabetes treatment plan.      Return in about 3 months (around 10/24/2021).   MDM Level 5: Patient has one or more chronic illness with severe exacerbation, progression, or side effects of treatment. Patient requires drug therapy which requires intensive monitoring for toxicity. Patient's care requires coordination with other providers and/or specialists.    SUBJECTIVE  PCP: [name]  Tplant: Amy Mottle  Optho:  Herma Ard  Wound: Keagy  Podiatry: Dr. Ether Griffins, [facility]   Derm: Davy Pique, [facility]    HPI: David Ellis is a 76 y.o. male who I last saw 01/2021 returns for follow up of T2DM with hx of kidney transplant.    Here w wife David Ellis  Maintains:  u500 insulin which he tries to give before meals.  Bfast 19 clicks (95units).  Lunch 8 clicks (40units) -- If glucose < 100, reduces 50%.  Dinner 6 clicks (10units) -- If glucose < 100, reduces 50%.   If glucose is <150 at bedtime, have low carb snack such as handful nuts, piece cheese, spoon of PNbutter.   AND  Trulicity 1.5mg  with sig reduced appetite following, but no stomach pain.   Weight stable w/in 2 lbs since last visit w me in 01/2021 and he feels at his baseline 250 lbs.    Download/review of past 28 days [place] CGM with similar glucose control to 01/2021:        He is aware of hyperglycemia in the AM, and as he takes insulin during the day, glucoses fall with some fear of hypglycemia 6pm ish which leads to concern of giving insulin and resulting highs.       Sig variety day to day. He denies change in how he gives insulin or activity.  He is aware that after he gives Trulicity Fri he has reduced appetite Sat/Sun with reduced glucoses which may explain the following tight control Sat/Sun Feb 11th/12th, but then sig hyperglycemia other days including past 3 days:      He needs repeat colonoscopy, and had severe hypoglycemia w LOC/EMS last time he prepped for colonoscopy.     He kicked stair when walking up so has wound on distal L 1st toe a month ago.   His wife has been caring for using techniques taught by wound clinic.  No longer bleeding/oozing, but not healing    Also concerns repeated falls with reqeust for referral to PT close to their house.    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   CKD followed by Nephrology. Has restarted ARB. Not on SGLT2i if I remember correctly due to hx hyperK.  Peripheral neuropathy w hx of amputation.  On Neurontin.   BP tightly controlled. Denies dizzyness on standing. Maintained on BB and ARB. Prn lasix for edema  Lipids on low dose pravastatin 20.          Endocrine Obtained / Updated Problem List:   1. T2DM dx 1992, insulin roughly 2000:   ?? Proliferative DR s/p hx vitreous hemorrhage and endolaser therapy.Marland Kitchen  ?? CKD s/p renal transplant  ?? 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 No monofilament 07/2021  ?? Hypoglycemic emergency requiring EMS 03/2011.   ?? Historically, Victoza, Ozempic, Trulicity were not covered or too $$.   ?? Offered to check cpeptide to see if he would qualify for insulin pump under medcare, though he is not interested.    ?? Off MTF since end of 2015/early 2016.   2. 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   3. HTN   4. Nephrolithiasis   5. OSA on cpap   6. Aflutter s/p ablation 06/2011, atrial fibrillation  7. PE    Patient Active Problem List   Diagnosis   ??? Type II diabetes mellitus with renal manifestations, uncontrolled   ??? Essential (primary) hypertension   ??? History of kidney transplant   ??? Mixed hyperlipidemia   ??? 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 Transplanted kidney   ??? Other pulmonary embolism without acute cor pulmonale (CMS-HCC)   ??? Edema   ??? Right ventricular dysfunction   ??? Atrial fibrillation (CMS-HCC)   ???  Aftercare following organ transplant   ??? Diverticulosis of colon   ??? Routine general medical examination at a health care facility   ??? OSA on CPAP   ??? Diabetic polyneuropathy associated with type 2 diabetes mellitus (CMS-HCC)   ??? Diabetic ulcer of left foot associated with type 2 diabetes mellitus (CMS-HCC)   ??? Toe amputation status   ??? Left knee pain   ??? BCC (basal cell carcinoma), face   ??? Acquired absence of other toe(s), unspecified side (CMS-HCC)   ??? History of colonic polyps   ??? Type 2 diabetes mellitus with foot ulcer (CODE) (CMS-HCC)   ??? 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 Hypoglycemia associated with type 2 diabetes mellitus (CMS-HCC)   ??? Class 2 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   ??? Immunosuppressed status (CMS-HCC)   ??? Syncope     Past Medical History:   Diagnosis Date   ??? Atrial flutter (CMS-HCC)    ??? Diabetes mellitus (CMS-HCC)    ??? Diabetic nephropathy (CMS-HCC)    ??? Diabetic retinopathy (CMS-HCC)    ??? Fractures    ??? Ganglion cyst    ??? Hand injury    ??? Heart disease    ??? Hyperlipidemia    ??? Hypertension    ??? Joint pain    ??? Osteomyelitis (CMS-HCC) June 2016   ??? Pulmonary embolism (CMS-HCC) Dec 2015   ??? 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 Stroke (CMS-HCC)    ??? Tear of meniscus of knee    ??? Transplanted kidney 08/21/2011     Medicines:     Current Outpatient Medications   Medication Sig Dispense Refill   ??? aspirin (ECOTRIN) 81 MG tablet Take 81 mg by mouth daily as needed (when flying).     ??? blood-glucose meter kit Use as instructed. 1 each 0   ??? cholecalciferol, vitamin D3-50 mcg, 2,000 unit,, 50 mcg (2,000 unit) tablet Take 2,000 Units by mouth daily.      ??? flash glucose sensor (FREESTYLE LIBRE 14 DAY SENSOR) kit by Other route every fourteen (14) days. 2 each 11   ??? furosemide (LASIX) 40 MG tablet Take 1-2 tablets daily as needed for edema. (Patient taking differently: 40 mg. 40 mg daily. And 1 additional tablet for edema.) 180 tablet 3   ??? gabapentin (NEURONTIN) 300 MG capsule TAKE 1 CAPSULE BY MOUTH TWICE DAILY AS DIRECTED (Patient taking differently: Take 300 mg by mouth two (2) times a day.) 180 capsule 3   ??? HUMULIN R U-500, CONC, KWIKPEN 500 unit/mL (3 mL) CONCENTRATED injection DIAL TO MAX 100 UNITS BEFORE BREAKFAST, LUNCH AND DINNER. INJECT 30 MINUTES BEFORE MEALS. MAX OF 300 UNITS DAILY. (Patient taking differently: Per sliding scale) 18 mL 11   ??? losartan (COZAAR) 25 MG tablet TAKE ONE TABLET BY MOUTH EVERY DAY 90 tablet 3   ??? magnesium chloride (SLOW_MAG) 64 mg TbEC Take 128 mg by mouth three (3) times a day (at 6am, noon and 6pm). (2 tablets) 9am 1 pm and 9 pm     ??? metoprolol succinate (TOPROL-XL) 100 MG 24 hr tablet TAKE ONE TABLET TWICE DAILY 180 tablet 1   ??? miscellaneous medical supply Misc 1 application by Miscellaneous route daily as needed. Lightweight wheelchair. 1 each 0   ??? miscellaneous medical supply Misc 1 application by Miscellaneous route nightly. Recommend CPAP 14 cm H2O with EPR 3 and heated humidifier  for nasal dryness, mask: ResMed Activa Lt nasal mask (Large). 1 each 0   ??? multivitamin capsule Take 1 capsule by mouth daily.     ??? MYFORTIC 180 mg EC tablet Take 3 tablets (540 mg total) by mouth two (2) times a day. 540 tablet 3   ??? pen needle, diabetic (BD ULTRA-FINE NANO PEN NEEDLE) 32 gauge x 5/32 (4 mm) Ndle 1 each by Other route Three (3) times a day before meals. 300 each 3   ??? pravastatin (PRAVACHOL) 20 MG tablet TAKE ONE TABLET BY MOUTH EVERY DAY 90 tablet 3   ??? tacrolimus (PROGRAF) 1 MG capsule Take 3 capsules (3 mg total) by mouth in the morning AND 2 capsules (2 mg total) in the evening. 450 capsule 3   ??? TRULICITY 1.5 mg/0.5 mL PnIj INJECT 1.5MG (0.5ML) SUBCUTANEOUSLY ONCE A WEEK 2 mL 11   ??? warfarin (JANTOVEN) 2.5 MG tablet TAKE 1 TABLET BY MOUTH ONCE DAILY 30 tablet 11   ??? warfarin (JANTOVEN) 5 MG tablet Take 1 tablet (5 mg total) by mouth daily with evening meal. TAKE 6 DAYS A WEEK - Tuesday through Sunday 90 tablet 3     No current facility-administered medications for this visit.     Facility-Administered Medications Ordered in Other Visits   Medication Dose Route Frequency Provider Last Rate Last Admin   ??? bevacizumab    PRN (once a day) Melvyn Novas, MD   2.5 mg at 01/28/17 1610       Social History: reviewed: his wife was kidney donor. rare etoh. no cig. 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     Other past medical history, medications, allergies and problem list are reviewed in the medical record    REVIEW OF SYSTEMS: Pertinent positives and negatives as in HPI, otherwise, all other systems reviewed are negative.     OBJECTIVE   Vitals:    07/27/21 0908   BP: 138/74   Pulse: 77   Weight: (!) 114.3 kg (252 lb)   Height: 182.9 cm (6')     Body mass index is 34.18 kg/m??.    BP Readings from Last 3 Encounters:   07/27/21 138/74   07/18/21 140/82   06/15/21 110/78     Wt Readings from Last 3 Encounters:   07/27/21 (!) 114.3 kg (252 lb)   07/18/21 (!) 113.9 kg (251 lb)   06/15/21 (!) 114.3 kg (252 lb)     GENERAL: well appearing  PSYCH: calm, cooperative with full affect  EYES: eomi, sclera anicteric  CV: sinus rate, regular rhythm  RESP: breathing nonlabored  Left distal 1st toe w 1cm round area of skin colored callous surrounded by 1mm area of red/unhealed 1-12mm deep tissue. No exudate.   Otherwise, bilateral feet with skin intact    Pertinent labs:  Office Visit on 07/27/2021   Component Date Value Ref Range Status   ??? Glucose, POC 07/27/2021 284 (H)  70 - 179 mg/dL Final   Appointment on 07/06/2021   Component Date Value Ref Range Status   ??? Sodium 07/06/2021 139  135 - 145 mmol/L Final   ??? Potassium 07/06/2021 4.5  3.5 - 5.1 mmol/L Final   ??? Chloride 07/06/2021 103  98 - 107 mmol/L Final   ??? CO2 07/06/2021 30.8  20.0 - 31.0 mmol/L Final   ??? Anion Gap 07/06/2021 5  5 - 14 mmol/L Final   ??? BUN 07/06/2021 26 (H)  9 - 23 mg/dL Final   ??? Creatinine 07/06/2021 1.27 (H)  0.60 - 1.10 mg/dL Final   ??? BUN/Creatinine Ratio 07/06/2021 20   Final   ??? eGFR CKD-EPI (2021) Male 07/06/2021 60  >=60 mL/min/1.49m2 Final    eGFR calculated with CKD-EPI 2021 equation in accordance with [employer] and [employer] of Nephrology Task Force recommendations.   ??? Glucose 07/06/2021 240 (H)  70 - 179 mg/dL Final   ??? Calcium 16/03/9603 9.1  8.7 - 10.4 mg/dL Final   ??? Magnesium 54/02/8118 1.9  1.6 - 2.6 mg/dL Final   ??? Phosphorus 07/06/2021 3.5  2.4 - 5.1 mg/dL Final   ??? Tacrolimus, Timed 07/06/2021 4.8  ng/mL Final   ??? PSA 07/06/2021 0.99  0.00 - 4.00 ng/mL Final   ??? WBC 07/06/2021 6.3  3.6 - 11.2 10*9/L Final   ??? RBC 07/06/2021 5.16  4.26 - 5.60 10*12/L Final   ??? HGB 07/06/2021 14.6  12.9 - 16.5 g/dL Final   ??? HCT 14/78/2956 43.8  39.0 - 48.0 % Final   ??? MCV 07/06/2021 84.8  77.6 - 95.7 fL Final   ??? MCH 07/06/2021 28.3  25.9 - 32.4 pg Final   ??? MCHC 07/06/2021 33.4  32.0 - 36.0 g/dL Final   ??? RDW 21/30/8657 13.8  12.2 - 15.2 % Final   ??? MPV 07/06/2021 8.0  6.8 - 10.7 fL Final   ??? Platelet 07/06/2021 191  150 - 450 10*9/L Final   ??? nRBC 07/06/2021 0  <=4 /100 WBCs Final   ??? Neutrophils % 07/06/2021 66.0  % Final   ??? Lymphocytes % 07/06/2021 22.9  % Final   ??? Monocytes % 07/06/2021 8.3  % Final   ??? Eosinophils % 07/06/2021 2.1  % Final   ??? Basophils % 07/06/2021 0.7  % Final   ??? Absolute Neutrophils 07/06/2021 4.2  1.8 - 7.8 10*9/L Final   ??? Absolute Lymphocytes 07/06/2021 1.4  1.1 - 3.6 10*9/L Final   ??? Absolute Monocytes 07/06/2021 0.5 0.3 - 0.8 10*9/L Final   ??? Absolute Eosinophils 07/06/2021 0.1  0.0 - 0.5 10*9/L Final   ??? Absolute Basophils 07/06/2021 0.0  0.0 - 0.1 10*9/L Final   ??? PT 07/06/2021 25.5 (H)  9.8 - 12.8 sec Final   ??? INR 07/06/2021 2.18   Final     Lab Results   Component Value Date    Hemoglobin A1C 9.0 (H) 06/08/2021    Hemoglobin A1C 9.4 (H) 04/07/2021    Hemoglobin A1C 8.9 (H) 03/07/2021    Hemoglobin A1C 9.1 (H) 01/19/2021    HB A1C, RAP/HGATE 6.7 (H) 04/07/2012    HB A1C, RAP/HGATE 7.4 (H) 03/30/2011    HGB A1C, POC 8.8 (H) 07/28/2020    HGB A1C, POC 7.3 (H) 01/27/2020    HGB A1C, POC 9.5 (H) 11/18/2018    HGB A1C, POC 9.3 (H) 04/24/2018    Hemoglobin A1c 7.6 (H) 05/17/2014     No components found for: SODIUM  Lab Results   Component Value Date    K 4.5 07/06/2021     Lab Results   Component Value Date    CREATININE 1.27 (H) 07/06/2021     Lab Results   Component Value Date    PTH 114.5 (H) 08/21/2019    CALCIUM 9.1 07/06/2021     Lab Results   Component Value Date    ALT 21 05/09/2021    ALT 22 04/21/2015     Lab Results   Component Value Date    CHOL 135 06/08/2021  Lab Results   Component Value Date    LDL 65 06/08/2021     Lab Results   Component Value Date    HDL 39 (L) 06/08/2021     Lab Results   Component Value Date    TRIG 155 (H) 06/08/2021     Lab Results   Component Value Date    TSH 2.202 07/02/2017    TSH 1.840 07/13/2015    TSH 1.420 07/09/2015     Lab Results   Component Value Date    TSH 2.202 07/02/2017     No results found for: CBC  No results found for: VITAMINB12  Lab Results   Component Value Date    VITD2 <5 08/17/2015    VITD3 42 08/17/2015    VITDTOTAL 32.7 08/26/2020     No results found for: Christa See, MSHCGMOM, MALBCRERAT  Lab Results   Component Value Date    Protein, Ur 52.9 08/26/2020    Protein, Ur 15 08/05/2014    Protein/Creatinine Ratio, Urine 0.397 08/26/2020    Protein/Creatinine Ratio, Urine 0.116 08/05/2014       IMAGING:

## 2021-07-27 NOTE — Unmapped (Signed)
[employer]. POC glucose done today. PP 8:00am. 284 mg/dL.

## 2021-07-28 ENCOUNTER — Emergency Department
Admit: 2021-07-28 | Discharge: 2021-07-28 | Disposition: A | Discharge status: Home / Self Care | Payer: MEDICARE | Attending: Emergency Medicine

## 2021-07-28 ENCOUNTER — Ambulatory Visit: Admit: 2021-07-28 | Discharge: 2021-07-28 | Disposition: A | Discharge status: Home / Self Care | Payer: MEDICARE

## 2021-07-28 ENCOUNTER — Other Ambulatory Visit
Admit: 2021-07-28 | Discharge: 2021-07-28 | Disposition: A | Discharge status: Home / Self Care | Payer: MEDICARE | Attending: Emergency Medicine

## 2021-07-28 ENCOUNTER — Ambulatory Visit
Admit: 2021-07-28 | Discharge: 2021-07-28 | Disposition: A | Discharge status: Home / Self Care | Payer: MEDICARE | Attending: Nephrology | Primary: Nephrology

## 2021-07-28 ENCOUNTER — Ambulatory Visit
Admit: 2021-07-28 | Discharge: 2021-07-28 | Disposition: A | Discharge status: Home / Self Care | Payer: MEDICARE | Attending: Emergency Medicine

## 2021-07-28 DIAGNOSIS — S42291A Other displaced fracture of upper end of right humerus, initial encounter for closed fracture: Principal | ICD-10-CM

## 2021-07-28 DIAGNOSIS — W19XXXA Unspecified fall, initial encounter: Principal | ICD-10-CM

## 2021-07-28 DIAGNOSIS — S0181XA Laceration without foreign body of other part of head, initial encounter: Principal | ICD-10-CM

## 2021-07-28 LAB — URINALYSIS WITH MICROSCOPY
BACTERIA: NONE SEEN /HPF
BILIRUBIN UA: NEGATIVE
GLUCOSE UA: 300 — AB
KETONES UA: NEGATIVE
LEUKOCYTE ESTERASE UA: NEGATIVE
NITRITE UA: NEGATIVE
PH UA: 5.5 (ref 5.0–9.0)
PROTEIN UA: 30 — AB
RBC UA: 2 /HPF (ref <=3)
SPECIFIC GRAVITY UA: 1.02 (ref 1.003–1.030)
SQUAMOUS EPITHELIAL: <1 /HPF (ref 0–5)
UROBILINOGEN UA: <2
WBC UA: 1 /HPF (ref <=2)

## 2021-07-28 LAB — COMPREHENSIVE METABOLIC PANEL
ALBUMIN: 3.6 g/dL (ref 3.4–5.0)
ALBUMIN: 3.5 g/dL (ref 3.4–5.0)
ALKALINE PHOSPHATASE: 89 U/L (ref 46–116)
ALKALINE PHOSPHATASE: 88 U/L (ref 46–116)
ALT (SGPT): 17 U/L (ref 10–49)
ALT (SGPT): 16 U/L (ref 10–49)
ANION GAP: 6 mmol/L (ref 5–14)
ANION GAP: 9 mmol/L (ref 5–14)
AST (SGOT): 22 U/L (ref <=34)
AST (SGOT): 20 U/L (ref <=34)
BILIRUBIN TOTAL: 0.9 mg/dL (ref 0.3–1.2)
BILIRUBIN TOTAL: 0.8 mg/dL (ref 0.3–1.2)
BLOOD UREA NITROGEN: 26 mg/dL — ABNORMAL HIGH (ref 9–23)
BLOOD UREA NITROGEN: 29 mg/dL — ABNORMAL HIGH (ref 9–23)
BUN / CREAT RATIO: 19
BUN / CREAT RATIO: 22
CALCIUM: 9.3 mg/dL (ref 8.7–10.4)
CALCIUM: 9.2 mg/dL (ref 8.7–10.4)
CHLORIDE: 105 mmol/L (ref 98–107)
CHLORIDE: 105 mmol/L (ref 98–107)
CO2: 27.1 mmol/L (ref 20.0–31.0)
CO2: 23.8 mmol/L (ref 20.0–31.0)
CREATININE: 1.37 mg/dL — ABNORMAL HIGH
CREATININE: 1.32 mg/dL — ABNORMAL HIGH
EGFR CKD-EPI (2021) MALE: 54 mL/min/{1.73_m2} — ABNORMAL LOW (ref >=60)
EGFR CKD-EPI (2021) MALE: 57 mL/min/{1.73_m2} — ABNORMAL LOW (ref >=60)
GLUCOSE RANDOM: 315 mg/dL — ABNORMAL HIGH (ref 70–99)
GLUCOSE RANDOM: 361 mg/dL — ABNORMAL HIGH (ref 70–179)
POTASSIUM: 4.7 mmol/L (ref 3.4–4.8)
POTASSIUM: 4.8 mmol/L (ref 3.4–4.8)
PROTEIN TOTAL: 6.8 g/dL (ref 5.7–8.2)
PROTEIN TOTAL: 6.6 g/dL (ref 5.7–8.2)
SODIUM: 138 mmol/L (ref 135–145)
SODIUM: 138 mmol/L (ref 135–145)

## 2021-07-28 LAB — CBC W/ AUTO DIFF
BASOPHILS ABSOLUTE COUNT: 0.1 10*9/L (ref 0.0–0.1)
BASOPHILS ABSOLUTE COUNT: 0 10*9/L (ref 0.0–0.1)
BASOPHILS RELATIVE PERCENT: 0.9 %
BASOPHILS RELATIVE PERCENT: 0.4 %
EOSINOPHILS ABSOLUTE COUNT: 0.1 10*9/L (ref 0.0–0.5)
EOSINOPHILS ABSOLUTE COUNT: 0.1 10*9/L (ref 0.0–0.5)
EOSINOPHILS RELATIVE PERCENT: 1.1 %
EOSINOPHILS RELATIVE PERCENT: 1 %
HEMATOCRIT: 42.4 % (ref 39.0–48.0)
HEMATOCRIT: 42.4 % (ref 39.0–48.0)
HEMOGLOBIN: 14.6 g/dL (ref 12.9–16.5)
HEMOGLOBIN: 14.3 g/dL (ref 12.9–16.5)
LYMPHOCYTES ABSOLUTE COUNT: 1.1 10*9/L (ref 1.1–3.6)
LYMPHOCYTES ABSOLUTE COUNT: 1.2 10*9/L (ref 1.1–3.6)
LYMPHOCYTES RELATIVE PERCENT: 16.2 %
LYMPHOCYTES RELATIVE PERCENT: 16 %
MEAN CORPUSCULAR HEMOGLOBIN CONC: 34.4 g/dL (ref 32.0–36.0)
MEAN CORPUSCULAR HEMOGLOBIN CONC: 33.7 g/dL (ref 32.0–36.0)
MEAN CORPUSCULAR HEMOGLOBIN: 28.8 pg (ref 25.9–32.4)
MEAN CORPUSCULAR HEMOGLOBIN: 28.6 pg (ref 25.9–32.4)
MEAN CORPUSCULAR VOLUME: 83.7 fL (ref 77.6–95.7)
MEAN CORPUSCULAR VOLUME: 84.8 fL (ref 77.6–95.7)
MEAN PLATELET VOLUME: 7.9 fL (ref 6.8–10.7)
MEAN PLATELET VOLUME: 8 fL (ref 6.8–10.7)
MONOCYTES ABSOLUTE COUNT: 0.5 10*9/L (ref 0.3–0.8)
MONOCYTES ABSOLUTE COUNT: 0.6 10*9/L (ref 0.3–0.8)
MONOCYTES RELATIVE PERCENT: 7.2 %
MONOCYTES RELATIVE PERCENT: 8.5 %
NEUTROPHILS ABSOLUTE COUNT: 5.2 10*9/L (ref 1.8–7.8)
NEUTROPHILS ABSOLUTE COUNT: 5.4 10*9/L (ref 1.8–7.8)
NEUTROPHILS RELATIVE PERCENT: 74.6 %
NEUTROPHILS RELATIVE PERCENT: 74.1 %
NUCLEATED RED BLOOD CELLS: 0 /100{WBCs} (ref <=4)
NUCLEATED RED BLOOD CELLS: 0 /100{WBCs} (ref <=4)
PLATELET COUNT: 197 10*9/L (ref 150–450)
PLATELET COUNT: 198 10*9/L (ref 150–450)
RED BLOOD CELL COUNT: 5.07 10*12/L (ref 4.26–5.60)
RED BLOOD CELL COUNT: 5 10*12/L (ref 4.26–5.60)
RED CELL DISTRIBUTION WIDTH: 13.9 % (ref 12.2–15.2)
RED CELL DISTRIBUTION WIDTH: 13.6 % (ref 12.2–15.2)
WBC ADJUSTED: 6.9 10*9/L (ref 3.6–11.2)
WBC ADJUSTED: 7.3 10*9/L (ref 3.6–11.2)

## 2021-07-28 LAB — IRON & TIBC
IRON SATURATION (CALC): 38 %
IRON: 107 ug/dL
TOTAL IRON BINDING CAPACITY (CALC): 281 ug/dL (ref 250.0–425.0)
TRANSFERRIN: 223 mg/dL

## 2021-07-28 LAB — PROTEIN / CREATININE RATIO, URINE
CREATININE, URINE: 130.6 mg/dL
PROTEIN URINE: 53.9 mg/dL
PROTEIN/CREAT RATIO, URINE: 0.413

## 2021-07-28 LAB — LIPID PANEL
CHOLESTEROL/HDL RATIO SCREEN: 3.4 (ref 1.0–4.5)
CHOLESTEROL: 136 mg/dL (ref <=200)
HDL CHOLESTEROL: 40 mg/dL (ref 40–60)
LDL CHOLESTEROL CALCULATED: 66 mg/dL (ref 40–99)
NON-HDL CHOLESTEROL: 96 mg/dL (ref 70–130)
TRIGLYCERIDES: 149 mg/dL (ref 0–150)
VLDL CHOLESTEROL CAL: 29.8 mg/dL (ref 12–42)

## 2021-07-28 LAB — PROTIME-INR
INR: 2.08
INR: 2.13
PROTIME: 24.2 s — ABNORMAL HIGH (ref 9.8–12.8)
PROTIME: 24.8 s — ABNORMAL HIGH (ref 9.8–12.8)

## 2021-07-28 LAB — HEMOGLOBIN A1C
ESTIMATED AVERAGE GLUCOSE: 200 mg/dL
HEMOGLOBIN A1C: 8.6 % — ABNORMAL HIGH (ref 4.8–5.6)

## 2021-07-28 LAB — MAGNESIUM: MAGNESIUM: 1.8 mg/dL (ref 1.6–2.6)

## 2021-07-28 LAB — PHOSPHORUS: PHOSPHORUS: 3.3 mg/dL (ref 2.4–5.1)

## 2021-07-28 LAB — TACROLIMUS LEVEL: TACROLIMUS BLOOD: 4.2 ng/mL

## 2021-07-28 MED ORDER — OXYCODONE 5 MG TABLET
ORAL_TABLET | ORAL | 0 refills | 2 days | Status: CP | PRN
Start: 2021-07-28 — End: 2021-08-02

## 2021-07-28 MED ADMIN — sodium chloride 0.9% (NS) bolus 500 mL: 500 mL | INTRAVENOUS | @ 15:00:00 | Stop: 2021-07-28

## 2021-07-28 MED ADMIN — ondansetron (ZOFRAN) injection 4 mg: 4 mg | INTRAVENOUS | @ 16:00:00 | Stop: 2021-07-28

## 2021-07-28 MED ADMIN — MORPhine 4 mg/mL injection 4 mg: 4 mg | INTRAVENOUS | @ 15:00:00 | Stop: 2021-07-28

## 2021-07-28 MED ADMIN — MORPhine 4 mg/mL injection 4 mg: 4 mg | INTRAVENOUS | @ 16:00:00 | Stop: 2021-07-28

## 2021-07-28 MED ADMIN — lidocaine (XYLOCAINE) 20 mg/mL (2 %) injection 10 mL: 10 mL | @ 18:00:00 | Stop: 2021-07-28

## 2021-07-28 NOTE — Unmapped (Signed)
[name]  Emergency Department Provider Note      ED Clinical Impression      Final diagnoses:   Fall, initial encounter (Primary)   Laceration of forehead, initial encounter   Humerus head fracture, right, closed, initial encounter          Impression, Medical Decision Making, Progress Notes and Critical Care      Impression, Differential Diagnosis and Plan of Care    David Ellis is a 76 y.o. male with a PMH of T2DM, diabetic nephropathy s/p kidney transplant, HTN, HLD, SCC, PE, atrial flutter, stroke, and OSA on CPAP, who presents to the ED for evaluation of severe right shoulder pain with small movements and throbbing head pain surrounding a forehead laceration after he sustained a mechanical fall earlier today, as described below.     Vital signs are stable and within normal limits. Patient is afebrile, non-hypertensive, non-tachycardic, and satting at 96% on room air. On exam, patient is uncomfortable with movement. 1.5 cm laceration over his right forehead with associated abrasion. Hemostatic. No cervical midline tenderness. TTP over the anterior and posterior right shoulder. ROM limited by pain. Humerus and elbow TTP. ROM of right wrist and hand intact. Intact sensation over right deltoid.  Intact skin over right upper extremity.  Soft compartments.  2+ radial pulse. Able to do okay and peace signs. Normal cardiopulmonary exam. No TTP over chest wall. No LE deformity or pain.  Difficulty to assess sensation due to chronic paresthesias.  Able to move LE without difficulty or pain. Normal LUE. Abdomen soft and non-tender.     In regards to patient's fall this event seems to be mechanical likely secondary to neuropathy, balance issues and sudden movements.  Low concern for arrhythmia genic event, orthostatics, ACS or electrolyte imbalance however we will obtain blood work for assessment and EKG.  In regards to patient's symptoms concern for right shoulder fracture versus humeral fracture versus less likely dislocation given normal rounded appearance of right shoulder versus contusion, will evaluate with right upper extremity x-rays.  Although patient with no midline tenderness to palpation over midline cervical area, given mechanical fall and distracting injury of right shoulder will assess cervical spine with cervical CT scan. Patient at this time alert, oriented in no acute distress and with no neurological symptoms for which lower concern for acute intracranial pathology however he is on anticoagulants with head injury and laceration to forehead will assess further intracranial pathology with CT head.  Will provide pain control with morphine, IV fluids and antiemetics.    Discussion of Management with other Physicians, QHP or Appropriate Source: Discussed case with orthopedic service who recommended outpatient follow-up, pain control and sling.  Independent Interpretation of Studies: Upper extremity x-ray: Evidence of proximal humeral fracture with no significant displacement.  CT head: No evidence of acute intracranial pathology.  CT cervical: No evidence of acute fractures or dislocation.  External Records Reviewed: Patient's most recent discharge summary and Patient's most recent outpatient clinic note  Escalation of Care, Consideration of Admission/Observation/Transfer: The patient is well appearing, has stable vitals,and would not benefit from hospital admission.  Social determinants that significantly affected care: None applicable  Prescription drug(s) considered but not prescribed: N/A  Diagnostic tests considered but not performed: N/A  History obtained from other sources: Family    Additional Progress Notes    10:17  CMP remarkable for elevated BG to 361. CBC unremarkable. PT elevated to 24.8, consistent with priors.  10:39  I re-assessed patient and he states that his pain is mildly improved after having administered the medications.     11:20  CT c-spine, CT head, and left elbow XR negative. Right humerus XR shows acute comminuted, mildly impacted, mildly displaced, fracture of the proximal humerus involving the surgical neck and greater tuberosity. Right shoulder XR confirms this fracture.     12:11  I spoke with patient and family about his imaging results. Family member notes that patient has no allergies to lidocaine but has experienced elevated heart rate with epinephrine in the past. Will use lidocaine without epi for laceration repair.    12:27  I re-assessed patient and he is feeling better after having new pain control medications. I also spoke with Ortho about the comminuted mildly displaced right proximal humerus fracture shown on his right shoulder XR. They recommend giving patient a sling and pain control. They will follow up with patient soon in the clinic.     13:07  I observed a new bruise on patient's left pinky finger. He states that this is new and that he developed it since arriving at the hospital. Patient is able to move the finger but is unable to report whether the finger is painful or not given his baseline paresthesia and neuropathy. Will order left hand XR imaging to rule out fracture.     13:42  Left hand XR negative.     14:05  Laceration repaired, see procedure note for further detail.. Will discharge patient with prescription for oxycodone for pain control and instructions for ortho follow up. Strict return precautions have been discussed and all questions have been appropriately answered. Patient is understanding and agreeable with plan.        Portions of this record have been created using Scientist, clinical (histocompatibility and immunogenetics). Dictation errors have been sought, but may not have been identified and corrected.    See chart and resident provider documentation for details.    ____________________________________________         History      Reason for Visit  Fall    HPI   David Ellis is a 76 y.o. male with a PMH of T2DM, diabetic nephropathy s/p kidney transplant, HTN, HLD, SCC, PE, atrial flutter, stroke, and OSA on CPAP, who presents to the ED for evaluation after a mechanical fall. Patient states that he was walking upstairs to receive an [place] in the hospital, when he attempted to avoid someone in the hallway and lost his balance, after which he hit a door frame and then fell face forward onto the floor. He was found on his left side, but endorses having braced the fall with his right arm. Patient sustained a laceration to his forehead from his glasses as he hit the floor, but denies LOC. He remembers the fall. He states that he did not have dizziness, palpitations, or a headache prior to the fall. Wife at bedside notes that patient has issues with balance as baseline and sometimes uses a cane to ambulate, although he did not use the cane today. Since the incident, patient endorses severe right shoulder pain with small movements and throbbing head pain surrounding his forehead laceration. Anticoagulated on Coumadin. Denies fevers, chills, abdominal pain, chest pain, new LE pain, or vision changes.  Patient is right-handed dominant.  Per wife at bedside patient should be using cane or walker for ambulation given lower extremity neuropathy and balance issues.    Outside Historian(s)  Wife at bedside and radiology  tech report.     External Records Reviewed  Inpatient/Outpatient notes, Prior labs/imaging studies, Care Everywhere, PDMP, External ED notes.       Past Medical History:   Diagnosis Date   ??? Atrial flutter (CMS-HCC)    ??? Diabetes mellitus (CMS-HCC)    ??? Diabetic nephropathy (CMS-HCC)    ??? Diabetic retinopathy (CMS-HCC)    ??? Fractures    ??? Ganglion cyst    ??? Hand injury    ??? Heart disease    ??? Hyperlipidemia    ??? Hypertension    ??? Joint pain    ??? Osteomyelitis (CMS-HCC) June 2016   ??? Pulmonary embolism (CMS-HCC) Dec 2015   ??? 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 Stroke (CMS-HCC)    ??? Tear of meniscus of knee    ??? Transplanted kidney 08/21/2011       Patient Active Problem List   Diagnosis   ??? Type II diabetes mellitus with renal manifestations, uncontrolled   ??? Essential (primary) hypertension   ??? History of kidney transplant   ??? Mixed hyperlipidemia   ??? 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 Transplanted kidney   ??? Other pulmonary embolism without acute cor pulmonale (CMS-HCC)   ??? Edema   ??? Right ventricular dysfunction   ??? Atrial fibrillation (CMS-HCC)   ??? Aftercare following organ transplant   ??? Diverticulosis of colon   ??? Routine general medical examination at a health care facility   ??? OSA on CPAP   ??? Diabetic polyneuropathy associated with type 2 diabetes mellitus (CMS-HCC)   ??? Diabetic ulcer of left foot associated with type 2 diabetes mellitus (CMS-HCC)   ??? Toe amputation status   ??? Left knee pain   ??? BCC (basal cell carcinoma), face   ??? Acquired absence of other toe(s), unspecified side (CMS-HCC)   ??? History of colonic polyps   ??? Type 2 diabetes mellitus with foot ulcer (CODE) (CMS-HCC)   ??? 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 Hypoglycemia associated with type 2 diabetes mellitus (CMS-HCC)   ??? Class 2 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   ??? Immunosuppressed status (CMS-HCC)   ??? Syncope   ??? Non-healing open wound of toe   ??? At high risk for falls       Past Surgical History:   Procedure Laterality Date   ??? NEPHRECTOMY TRANSPLANTED ORGAN Right     LURD - spouse received in 2013.   ??? PR AMPUTATION METATARSAL+TOE,SINGLE Left 07/06/2014    Procedure: AMPUTATION, METATARSAL, WITH TOE SINGLE;  Surgeon: Marion Downer, MD;  Location: MAIN OR [facility];  Service: Vascular   ??? PR AMPUTATION METATARSAL+TOE,SINGLE Left 10/28/2014    Procedure: AMPUTATION, METATARSAL, WITH TOE SINGLE;  Surgeon: Maple Mirza, MD;  Location: MAIN [facility];  Service: Vascular   ??? PR VITRECTOMY,PANRETINAL LASER RX Right 11/26/2016    Procedure: VITRECTOMY, MECHANICAL, PARS PLANA APPROACH; WITH ENDOLASER PANRETINAL PHOTOCOAGULATION;  Surgeon: Melvyn Novas, MD;  Location: [facility];  Service: Ophthalmology   ??? PR VITRECTOMY,PANRETINAL LASER RX Left 01/28/2017    Procedure: VITRECTOMY, MECHANICAL, PARS PLANA APPROACH; WITH ENDOLASER PANRETINAL PHOTOCOAGULATION;  Surgeon: Melvyn Novas, MD;  Location: [facility];  Service: Ophthalmology   ??? SKIN BIOPSY         No current facility-administered medications for this encounter.    Current Outpatient Medications:   ???  aspirin (ECOTRIN) 81 MG tablet, Take 81 mg by mouth daily as needed (when flying)., Disp: , Rfl:   ???  blood-glucose meter kit, Use as instructed., Disp: 1 each, Rfl: 0  ???  cholecalciferol, vitamin D3-50 mcg, 2,000 unit,, 50 mcg (2,000 unit) tablet, Take 2,000 Units by mouth daily. , Disp: , Rfl:   ???  flash glucose sensor (FREESTYLE LIBRE 14 DAY SENSOR) kit, by Other route every fourteen (14) days., Disp: 2 each, Rfl: 11  ???  furosemide (LASIX) 40 MG tablet, Take 1-2 tablets daily as needed for edema. (Patient taking differently: 40 mg. 40 mg daily. And 1 additional tablet for edema.), Disp: 180 tablet, Rfl: 3  ???  gabapentin (NEURONTIN) 300 MG capsule, TAKE 1 CAPSULE BY MOUTH TWICE DAILY AS DIRECTED (Patient taking differently: Take 300 mg by mouth two (2) times a day.), Disp: 180 capsule, Rfl: 3  ???  HUMULIN R U-500, CONC, KWIKPEN 500 unit/mL (3 mL) CONCENTRATED injection, DIAL TO MAX 100 UNITS BEFORE BREAKFAST, LUNCH AND DINNER. INJECT 30 MINUTES BEFORE MEALS. MAX OF 300 UNITS DAILY. (Patient taking differently: Per sliding scale), Disp: 18 mL, Rfl: 11  ???  insulin ASPART (NOVOLOG FLEXPEN U-100 INSULIN) 100 unit/mL (3 mL) injection pen, 15-25 units with meals + correction. Max 100units daily., Disp: 30 mL, Rfl: 12  ???  insulin degludec 200 unit/mL (3 mL) InPn, Inject 0.4 mL (80 Units total) under the skin daily. Titrate up to 100units daily as instructed., Disp: 30 mL, Rfl: 12  ???  losartan (COZAAR) 25 MG tablet, TAKE ONE TABLET BY MOUTH EVERY DAY, Disp: 90 tablet, Rfl: 3  ???  magnesium chloride (SLOW_MAG) 64 mg TbEC, Take 128 mg by mouth three (3) times a day (at 6am, noon and 6pm). (2 tablets) 9am 1 pm and 9 pm, Disp: , Rfl:   ???  metoprolol succinate (TOPROL-XL) 100 MG 24 hr tablet, TAKE ONE TABLET TWICE DAILY, Disp: 180 tablet, Rfl: 1  ???  miscellaneous medical supply Misc, 1 application by Miscellaneous route daily as needed. Lightweight wheelchair., Disp: 1 each, Rfl: 0  ???  miscellaneous medical supply Misc, 1 application by Miscellaneous route nightly. Recommend CPAP 14 cm H2O with EPR 3 and heated humidifier for nasal dryness, mask: ResMed Activa Lt nasal mask (Large)., Disp: 1 each, Rfl: 0  ???  multivitamin capsule, Take 1 capsule by mouth daily., Disp: , Rfl:   ???  MYFORTIC 180 mg EC tablet, Take 3 tablets (540 mg total) by mouth two (2) times a day., Disp: 540 tablet, Rfl: 3  ???  pen needle, diabetic (BD ULTRA-FINE NANO PEN NEEDLE) 32 gauge x 5/32 (4 mm) Ndle, 1 each by Other route Three (3) times a day before meals., Disp: 300 each, Rfl: 3  ???  pravastatin (PRAVACHOL) 20 MG tablet, TAKE ONE TABLET BY MOUTH EVERY DAY, Disp: 90 tablet, Rfl: 3  ???  tacrolimus (PROGRAF) 1 MG capsule, Take 3 capsules (3 mg total) by mouth in the morning AND 2 capsules (2 mg total) in the evening., Disp: 450 capsule, Rfl: 3  ???  TRULICITY 1.5 mg/0.5 mL PnIj, INJECT 1.5MG (0.5ML) SUBCUTANEOUSLY ONCE A WEEK, Disp: 2 mL, Rfl: 11  ???  warfarin (JANTOVEN) 2.5 MG tablet, TAKE 1 TABLET BY MOUTH ONCE DAILY, Disp: 30 tablet, Rfl: 11  ???  warfarin (JANTOVEN) 5 MG tablet, Take 1 tablet (5 mg total) by mouth daily with evening meal. TAKE 6 DAYS A WEEK - Tuesday through Sunday, Disp: 90 tablet, Rfl: 3  Facility-Administered Medications Ordered in Other Encounters:   ???  bevacizumab, , , PRN (once a day), Melvyn Novas, MD, 2.5 mg at 01/28/17 0750    Allergies  Levofloxacin; Lidocaine; Penicillins; Egg derived; Enalapril; Epinephrine; Grass pollen-bermuda, standard; Lisinopril; and Mepivacaine hcl    Family History   Problem Relation Age of Onset   ??? Kidney disease Mother    ??? Diabetes Mother    ??? Kidney disease Father    ??? Kidney disease Maternal Grandmother    ??? Diabetes Maternal Grandmother    ??? Glaucoma Neg Hx    ??? Macular degeneration Neg Hx    ??? Strabismus Neg Hx        Social History  Social History     Tobacco Use   ??? Smoking status: Never   ??? Smokeless tobacco: Never   Substance Use Topics   ??? Alcohol use: No   ??? Drug use: No          Physical Exam     This provider entered the patient's room: Yes:    ??? If this provider did not enter the room, a comprehensive physical exam was not able to be performed due to increased infection risk to themselves, other providers, staff and other patients), as well as to conserve personal protective equipment (PPE) utilization during the COVID-19 pandemic.    ??? If this provider did enter the patient room, the following was PPE worn: Surgical mask, eye protection and gloves     ED Triage Vitals [07/28/21 0920]   Enc Vitals Group      BP 186/88      Heart Rate 70      SpO2 Pulse 70      Resp 12      Temp 36.7 ??C (98.1 ??F)      Temp Source Oral      SpO2 97 %     Constitutional: Alert and oriented. Uncomfortable with movement. In no distress.  Eyes: Conjunctivae are normal.  PERRL.  EOMI.  ENT       Head: Right forehead laceration/abrasion.  Hemostatic.       Nose: No congestion.       Mouth/Throat: Mucous membranes are moist.  Cardiovascular: Normal rate, regular rhythm. Normal and symmetric distal pulses are present in all extremities.  Respiratory: Normal respiratory effort. Breath sounds are normal.  Gastrointestinal: Soft and nontender. There is no CVA tenderness.  Musculoskeletal: No cervical midline tenderness.  TTP over the anterior and posterior right shoulder. ROM limited by pain. Humerus and elbow TTP. ROM of right wrist and hand intact. Intact sensation over right deltoid.  Intact skin over right upper extremity. Soft compartments.  2+ radial pulse. Able to do okay and peace signs.  No TTP over chest wall. No LE deformity or pain outside of chronic paresthesia. Able to move LE without difficulty. Normal LUE.   Neurologic: Normal speech and language. No gross focal neurologic deficits are appreciated.  Skin: 1 cm laceration over his right forehead with associated abrasion. Hemostatic. No rash noted.  Psychiatric: Mood and affect are normal. Speech and behavior are normal.       Radiology     XR Hand 3 Or More Views Left   Final Result   No acute osseous abnormality.      XR Shoulder 3 Or More Views Right   Final Result   Comminuted mildly displaced right proximal humerus fracture.      XR Humerus Right   Final  Result   Acute, comminuted, mildly impacted, mildly displaced, fracture of the proximal humerus involving the surgical neck and greater tuberosity.       XR Elbow 3 Or More Views Left   Final Result   No acute osseous abnormality.           CT Head Wo Contrast   Final Result   No acute intracranial abnormality.      CT Cervical Spine Trauma   Final Result      No acute fracture or traumatic malalignment. Degenerative disc disease, facet osteoarthrosis, and DISH.              Procedures     Lac Repair    Date/Time: 07/28/2021 2:06 PM  Performed by: Pincus Sanes', MD  Authorized by: Pincus Sanes', MD     Consent:     Consent obtained:  Verbal    Consent given by:  Patient    Risks, benefits, and alternatives were discussed: yes      Risks discussed:  Infection and poor wound healing  Anesthesia:     Anesthesia method:  Local infiltration    Local anesthetic:  Lidocaine 2% w/o epi  Laceration details:     Location:  Face    Face location:  Forehead    Length (cm):  1.5  Pre-procedure details:     Preparation:  Patient was prepped and draped in usual sterile fashion  Exploration:     Limited defect created (wound extended): no      Hemostasis achieved with:  Direct pressure    Imaging outcome: foreign body not noted    Treatment:     Area cleansed with:  Saline    Amount of cleaning:  Standard    Irrigation solution:  Sterile water    Irrigation method:  Pressure wash  Skin repair:     Repair method:  Sutures    Suture size:  5-0    Suture material:  Fast-absorbing gut    Number of sutures:  3  Approximation:     Approximation:  Close  Repair type:     Repair type:  Simple  Post-procedure details:     Dressing:  Tube gauze and non-adherent dressing    Procedure completion:  Tolerated well, no immediate complications      ______________________________________________________________   Documentation assistance was provided by Charlsie Quest, Scribe, on July 28, 2021 at 9:18 AM for Pincus Sanes, MD.    Documentation assistance provided by the scribe. I was present during the time the encounter was recorded. The information recorded by the scribe was done at my direction and has been reviewed and validated by me.            Raelynn Corron Luciano-Feijoo', MD  07/29/21 1735

## 2021-07-28 NOTE — Unmapped (Signed)
Escorted patient to the car in a wheelchair. Assisted to front passenger seat and seatbelt attached.

## 2021-07-28 NOTE — Unmapped (Signed)
The [facility] ED called regarding this patient requesting E-consult for evaluation of pertinent findings and further orthopedic surgery recommendations.  This patient was not physically evaluated as they are currently present at [facility] and consulting orthopedic service is at [facility].  The following consult is in consideration of ED providers reported history, clinical exam findings, imaging, labs, and any other pertinent factors pertaining to this patient.    Interval history:  Per the Provider; Patient is a 75 year old right-hand-dominant male with a PMH of diabetes (on insulin) and A-fib (on Coumadin) and s/p renal transplant.Per the provider patient should be using cane for ambulation due to severe neuropathy, however he typically ambulates without assistive devices. Patient was at the [facility] hospital today to obtain imaging when he had a ground-level fall after tripping and losing his balance.  Patient denies LOC.  Patient presented to the emergency department with right shoulder pain.        Exam:   RUE: Deformity, swelling and pain about the shoulder. SILT in axillary distribution. Shoulder ROM decreased secondary to pain. Distal pulses intact. Remainder of extremity without swelling, ecchymoses, deformity, or effusion. Skin intact. No tenting, impending open fracture. Nontender to palpation, with full and painless ROM throughout. + Motor in Axillary, with baseline decreased sensation to AIN, PIN, Ulnar distributions. Compartments soft and compressible.       Vitals: BP 161/73  - Pulse 73  - Temp 36.7 ??C (98.1 ??F) (Oral)  - Resp 12  - SpO2 96%     Imaging:    Right shoulder x-ray demonstrates comminuted right proximal humerus fracture.  Glenohumeral joint is reduced.      Plan:   1. Right proximal humerus fracture  ??? Glenohumeral joint reduced on the [place] and axillary views .  ??? Normal axillary nerve exam.   ??? Recommend immobilization in a cuff and collar sling  ??? Operative treatment is not anticipated  ??? Weight Bearing Status/Activity: nonweightbearing on the right upper extremity    - Orthopaedic surgery will arrange outpatient follow-up.    Erik Obey, FNP-BC   [facility]  Pager: [phone]    *This patient discussed with senior on call resident, consult will be staffed with on-call attending. The history and physical examination of the patient is as reported by the ED provider, as I was not present to examine this patient.

## 2021-07-28 NOTE — Unmapped (Signed)
Rapid response called on this patient that lost his balance and fell in [place].  Pt hit the door frame and fell face down.  Injury to forehead.  + anticoagulant.  No LOC.  C/o right shoulder pain.  Pt immobilized and brought back to ED room 15.

## 2021-07-29 LAB — CMV DNA, QUANTITATIVE, PCR: CMV VIRAL LD: NOT DETECTED

## 2021-07-29 LAB — VITAMIN D 25 HYDROXY: VITAMIN D, TOTAL (25OH): 34.6 ng/mL (ref 20.0–80.0)

## 2021-08-01 LAB — VITAMIN D 1,25 DIHYDROXY: VITAMIN D 1,25-DIHYDROXY: 30 pg/mL

## 2021-08-01 LAB — BK VIRUS QUANTITATIVE PCR, BLOOD: BK BLOOD RESULT: NOT DETECTED

## 2021-08-01 NOTE — Unmapped (Signed)
Pt notifying staff that he had a fall on Friday into the door. Pt landed and broke his humerus R shoulder. He was in the ED for a while. Stitches in forehead( 3). Pt did have a ct scan. Negative.

## 2021-08-04 ENCOUNTER — Ambulatory Visit: Admit: 2021-08-04 | Discharge: 2021-08-05 | Payer: MEDICARE

## 2021-08-04 MED ORDER — OXYCODONE 5 MG TABLET
ORAL_TABLET | Freq: Three times a day (TID) | ORAL | 0 refills | 5 days | Status: CP | PRN
Start: 2021-08-04 — End: ?

## 2021-08-04 NOTE — Unmapped (Signed)
ORTHOPAEDIC NOTE     David Ellis L. David Hatcher, PA-C        David Ellis    MRN: [id number]  DOB: 08/04/1945    Date of visit: 08/04/2021    Clinic location: [place]     ASSESSMENT:     Mildly displaced right proximal humerus fracture sustained on 07/27/2021     PLAN:     - Prescription for oxycodone given for pain  -Patient understands that the current fracture appears stable and is amenable to non operative treatment if there is no change in alignment  -Patient understands that this generally takes ~8 weeks to heal and ~6 months for pain and swelling to resolve  - Home exercise program provided for ROM and/or strengthening  non-weight bearing  affected side  He will perform pendulum, shoulder range of motion exercises wrist and digit range of motion exercises  He understands that he will have some loss of range of motion but ideally this would not cause functional deficit  -Advised OTC analgesic PRN pain  -Discussed treatment options and patient was amenable to the above plan and was instructed to call and be seen if there is any increasing pain or concerns.     Follow up: 2 weeks, 3 views right shoulder       Chief Complaint:     Right arm injury     SUBJECTIVE:     HPI: David Ellis is a RHD who is an avid golfer 76 y.o. 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  He presented to [facility] emergency department images of the right shoulder, right humerus, left elbow, left hand, cervical spine showed acute findings of right proximal humerus fracture.  He was placed in a shoulder immobilizer.  He was discharged on oxycodone.  He has pain in his elbow and wrist and occasionally in his digits.  He is mainly taking Tylenol for pain. Allergies  Allergies   Allergen Reactions   ??? Levofloxacin Other (See Comments)     Other reaction(s): Unknown  Unknown   ??? Lidocaine Other (See Comments)     I don't remember States is fine with Bupivicaine   ??? Penicillins      As child  Other reaction(s): UNKNOWN.   (UPDATE: Tolerated amp/sulbactam without side effects during 10/2014 hospitalization)   ??? Egg Derived Rash   ??? Enalapril Cough   ??? Epinephrine Palpitations   ??? Grass Pollen-[place], Standard Itching   ??? Lisinopril Rash   ??? Mepivacaine Hcl Palpitations     Past Medical History  Past Medical History:   Diagnosis Date   ??? Atrial flutter (CMS-HCC)    ??? Diabetes mellitus (CMS-HCC)    ??? Diabetic nephropathy (CMS-HCC)    ??? Diabetic retinopathy (CMS-HCC)    ??? Fractures    ??? Ganglion cyst    ??? Hand injury    ??? Heart disease    ??? Hyperlipidemia    ??? Hypertension    ??? Joint pain    ??? Osteomyelitis (CMS-HCC) June 2016   ??? Pulmonary embolism (CMS-HCC) Dec 2015   ??? 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 Stroke (CMS-HCC)    ???  Tear of meniscus of knee    ??? Transplanted kidney 08/21/2011        PHYSICAL EXAM:     MSK: Right arm  Inspection: Proximal humeral edema/ecchymosis no erythema, skin intact  Palpation: Tenderness on the proximal humerus, mild tenderness along the olecranon, no CV tenderness on the distal radius ulna metacarpals phalanges  ROM: Full range of motion of wrist, and digits, able to flex the elbow to 130 degrees and extend to 90  Strength: Intact pincer grasp strength  normal sensation RUE  radial pulses easily palpable     Imaging   4 views right shoulder obtained in clinic today independently viewed interpreted by myself show mildly displaced right proximal humerus surgical neck and greater tuberosity fracture    MEDICAL DECISION MAKING (level of service defined by 2/3 elements)     Number/Complexity of Problems Addressed 1 acute, uncomplicated illness or injury (99203/99213) Amount/Complexity of Data to be Reviewed/Analyzed Independent interpretation of a test performed by another physician/other qualified health care professional (99204/99214)   Risk of Complications/Morbidity/Mortality of Management Closed Fracture Treatment WITHOUT Manipulation (99204/99214)   DME ORDER:  Dx:  ,                   cc:  [name], MD  *Patient note was created using Dragon Dictation sotware. Errors in syntax or grammar may not have been identified and edited on initial review.

## 2021-08-04 NOTE — Unmapped (Signed)
[facility] Refill Coordination Note    Specialty Medication(s) to be Shipped:   Transplant: Myfortic 180mg  and tacrolimus 1mg     Other medication(s) to be shipped: No additional medications requested for fill at this time     David Ellis, DOB: Aug 07, 1945  Phone: [phone] (home)       All above HIPAA information was verified with patient.     Was a Nurse, learning disability used for this call? No    Completed refill call assessment today to schedule patient's medication shipment from the [facility] [phone]).  All relevant notes have been reviewed.     Specialty medication(s) and dose(s) confirmed: Regimen is correct and unchanged.   Changes to medications: Aldyn reports no changes at this time.  Changes to insurance: No  New side effects reported not previously addressed with a pharmacist or physician: None reported  Questions for the pharmacist: No    Confirmed patient received a Conservation officer, historic buildings and a Surveyor, mining with first shipment. The patient will receive a drug information handout for each medication shipped and additional FDA Medication Guides as required.       DISEASE/MEDICATION-SPECIFIC INFORMATION        N/A    SPECIALTY MEDICATION ADHERENCE     Medication Adherence    Patient reported X missed doses in the last month: 0  Specialty Medication: MYFORTIC 180 MG EC tablet (mycophenolate)  Patient is on additional specialty medications: Yes  Additional Specialty Medications: tacrolimus 1 MG capsule (PROGRAF)  Patient Reported Additional Medication X Missed Doses in the Last Month: 0  Adherence tools used: patient uses a pill box to manage medications  Support network for adherence: family member        Myfortic 180mg  7 days worth of medication on hand.  tacrolimus 1mg  7 days worth of medication on hand.\      Were doses missed due to medication being on hold? No        REFERRAL TO PHARMACIST     Referral to the pharmacist: Not needed      [facility] Shipping address confirmed in Epic.     Delivery Scheduled: Yes, Expected medication delivery date: 08/08/21.     Medication will be delivered via Same Day Courier to the prescription address in Epic WAM.    [place] A Vanecia Limpert   [facility] Specialty Technician

## 2021-08-05 LAB — HLA DS POST TRANSPLANT
ANTI-DONOR DRW #2 MFI: 0 MFI
ANTI-DONOR HLA-A #1 MFI: 0 MFI
ANTI-DONOR HLA-B #1 MFI: 26 MFI
ANTI-DONOR HLA-B #2 MFI: 25 MFI
ANTI-DONOR HLA-C #1 MFI: 0 MFI
ANTI-DONOR HLA-C #2 MFI: 0 MFI
ANTI-DONOR HLA-DQB #1 MFI: 0 MFI
ANTI-DONOR HLA-DQB #2 MFI: 45 MFI
ANTI-DONOR HLA-DR #1 MFI: 10 MFI
ANTI-DONOR HLA-DR #2 MFI: 100 MFI

## 2021-08-05 LAB — FSAB CLASS 2 ANTIBODY SPECIFICITY: HLA CL2 AB RESULT: NEGATIVE

## 2021-08-05 LAB — FSAB CLASS 1 ANTIBODY SPECIFICITY: HLA CLASS 1 ANTIBODY RESULT: NEGATIVE

## 2021-08-08 ENCOUNTER — Ambulatory Visit: Admit: 2021-08-08 | Discharge: 2021-08-09 | Payer: MEDICARE

## 2021-08-08 DIAGNOSIS — H4311 Vitreous hemorrhage, right eye: Principal | ICD-10-CM

## 2021-08-08 DIAGNOSIS — H35373 Puckering of macula, bilateral: Principal | ICD-10-CM

## 2021-08-08 DIAGNOSIS — Z961 Presence of intraocular lens: Principal | ICD-10-CM

## 2021-08-08 DIAGNOSIS — E113593 Type 2 diabetes mellitus with proliferative diabetic retinopathy without macular edema, bilateral: Principal | ICD-10-CM

## 2021-08-08 DIAGNOSIS — H4312 Vitreous hemorrhage, left eye: Principal | ICD-10-CM

## 2021-08-08 MED FILL — TACROLIMUS 1 MG CAPSULE, IMMEDIATE-RELEASE: ORAL | 30 days supply | Qty: 150 | Fill #3

## 2021-08-08 MED FILL — MYFORTIC 180 MG TABLET,DELAYED RELEASE: ORAL | 30 days supply | Qty: 180 | Fill #8

## 2021-08-08 NOTE — Unmapped (Signed)
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  I reviewed the resident???s note.  I agree with the resident???s findings and plan including interpretation of images.     EXTENDED OPHTHALMOSCOPY RESULTS:  OD: MAs, PRP 360  OS: MAs, PRP 360

## 2021-08-12 ENCOUNTER — Other Ambulatory Visit: Payer: Self-pay

## 2021-08-12 ENCOUNTER — Emergency Department
Admission: EM | Admit: 2021-08-12 | Discharge: 2021-08-12 | Disposition: A | Discharge status: Home / Self Care | Payer: Medicare Other | Attending: Emergency Medicine | Admitting: Emergency Medicine

## 2021-08-12 ENCOUNTER — Emergency Department: Payer: Medicare Other

## 2021-08-12 DIAGNOSIS — E1140 Type 2 diabetes mellitus with diabetic neuropathy, unspecified: Secondary | ICD-10-CM | POA: Insufficient documentation

## 2021-08-12 DIAGNOSIS — D72829 Elevated white blood cell count, unspecified: Secondary | ICD-10-CM | POA: Insufficient documentation

## 2021-08-12 DIAGNOSIS — R296 Repeated falls: Secondary | ICD-10-CM | POA: Diagnosis present

## 2021-08-12 DIAGNOSIS — Z7901 Long term (current) use of anticoagulants: Secondary | ICD-10-CM | POA: Insufficient documentation

## 2021-08-12 DIAGNOSIS — Z794 Long term (current) use of insulin: Secondary | ICD-10-CM | POA: Insufficient documentation

## 2021-08-12 DIAGNOSIS — E11649 Type 2 diabetes mellitus with hypoglycemia without coma: Secondary | ICD-10-CM | POA: Diagnosis present

## 2021-08-12 DIAGNOSIS — W19XXXA Unspecified fall, initial encounter: Secondary | ICD-10-CM | POA: Insufficient documentation

## 2021-08-12 DIAGNOSIS — E162 Hypoglycemia, unspecified: Secondary | ICD-10-CM

## 2021-08-12 DIAGNOSIS — M79601 Pain in right arm: Secondary | ICD-10-CM | POA: Insufficient documentation

## 2021-08-12 DIAGNOSIS — R531 Weakness: Secondary | ICD-10-CM

## 2021-08-12 HISTORY — DX: Other pulmonary embolism without acute cor pulmonale: I26.99

## 2021-08-12 LAB — URINALYSIS, ROUTINE W REFLEX MICROSCOPIC
Bilirubin Urine: NEGATIVE
Glucose, UA: >=500 mg/dL — AB
Ketones, ur: NEGATIVE mg/dL
Leukocytes,Ua: NEGATIVE
Nitrite: NEGATIVE
Protein, ur: 100 mg/dL — AB
Specific Gravity, Urine: 1.02 (ref 1.005–1.030)
pH: 5 (ref 5.0–8.0)

## 2021-08-12 LAB — CBC WITH DIFFERENTIAL/PLATELET
Abs Immature Granulocytes: 0.11 K/uL — ABNORMAL HIGH (ref 0.00–0.07)
Basophils Absolute: 0.1 K/uL (ref 0.0–0.1)
Basophils Relative: 0 %
Eosinophils Absolute: 0 K/uL (ref 0.0–0.5)
Eosinophils Relative: 0 %
HCT: 45.4 % (ref 39.0–52.0)
Hemoglobin: 14.7 g/dL (ref 13.0–17.0)
Immature Granulocytes: 1 %
Lymphocytes Relative: 7 %
Lymphs Abs: 1 K/uL (ref 0.7–4.0)
MCH: 27.9 pg (ref 26.0–34.0)
MCHC: 32.4 g/dL (ref 30.0–36.0)
MCV: 86.3 fL (ref 80.0–100.0)
Monocytes Absolute: 1 K/uL (ref 0.1–1.0)
Monocytes Relative: 7 %
Neutro Abs: 12.1 K/uL — ABNORMAL HIGH (ref 1.7–7.7)
Neutrophils Relative %: 85 %
Platelets: 280 K/uL (ref 150–400)
RBC: 5.26 MIL/uL (ref 4.22–5.81)
RDW: 13.2 % (ref 11.5–15.5)
WBC: 14.3 K/uL — ABNORMAL HIGH (ref 4.0–10.5)
nRBC: 0 % (ref 0.0–0.2)

## 2021-08-12 LAB — COMPREHENSIVE METABOLIC PANEL WITH GFR
ALT: 20 U/L (ref 0–44)
AST: 31 U/L (ref 15–41)
Albumin: 3.6 g/dL (ref 3.5–5.0)
Alkaline Phosphatase: 104 U/L (ref 38–126)
Anion gap: 12 (ref 5–15)
BUN: 31 mg/dL — ABNORMAL HIGH (ref 8–23)
CO2: 25 mmol/L (ref 22–32)
Calcium: 9 mg/dL (ref 8.9–10.3)
Chloride: 102 mmol/L (ref 98–111)
Creatinine, Ser: 1.49 mg/dL — ABNORMAL HIGH (ref 0.61–1.24)
GFR, Estimated: 49 mL/min — ABNORMAL LOW
Glucose, Bld: 148 mg/dL — ABNORMAL HIGH (ref 70–99)
Potassium: 3.9 mmol/L (ref 3.5–5.1)
Sodium: 139 mmol/L (ref 135–145)
Total Bilirubin: 1 mg/dL (ref 0.3–1.2)
Total Protein: 7.3 g/dL (ref 6.5–8.1)

## 2021-08-12 LAB — TROPONIN I (HIGH SENSITIVITY)
Troponin I (High Sensitivity): 11 ng/L
Troponin I (High Sensitivity): 11 ng/L (ref <18)

## 2021-08-12 LAB — PROTIME-INR
INR: 3.4 — ABNORMAL HIGH (ref 0.8–1.2)
Prothrombin Time: 33.9 s — ABNORMAL HIGH (ref 11.4–15.2)

## 2021-08-12 LAB — CBG MONITORING, ED
Glucose-Capillary: 144 mg/dL — ABNORMAL HIGH (ref 70–99)
Glucose-Capillary: 253 mg/dL — ABNORMAL HIGH (ref 70–99)

## 2021-08-12 MED ORDER — OXYCODONE-ACETAMINOPHEN 5-325 MG PO TABS
1.0000 | ORAL_TABLET | Freq: Once | ORAL | Status: AC
  Administered 2021-08-12: 1 via ORAL
  Filled 2021-08-12: qty 1

## 2021-08-12 MED ORDER — OXYCODONE HCL 5 MG PO TABS
5.0000 mg | ORAL_TABLET | Freq: Once | ORAL | Status: AC
  Administered 2021-08-12: 5 mg via ORAL
  Filled 2021-08-12: qty 1

## 2021-08-12 MED ORDER — SODIUM CHLORIDE 0.9 % IV BOLUS
500.0000 mL | Freq: Once | INTRAVENOUS | Status: AC
  Administered 2021-08-12: 500 mL via INTRAVENOUS

## 2021-08-12 NOTE — Discharge Instructions (Signed)
Follow-up with your orthopedic doctor as planned and follow-up with your primary care doctor to discuss your diabetes medications.  Carefully monitor her sugars over the next 24 hours and make sure he is eating full meals with insulin.  I would rather him run a little high then too low. Return to the ER if he develops worsening symptoms or any other concern ? ?IMPRESSION: ?1. At least Neer 2 part fracture of the right proximal humerus, with ?a displaced surgical neck fracture component, potentially ?nondisplaced greater tuberosity component, and a triangular bony ?fragment projecting over the axillary pouch potentially loose within ?the joint. ?  ?

## 2021-08-12 NOTE — ED Notes (Signed)
Ambulated pt in room with help of other nurse. Pt was extremely unsteady walking and sitting on toilet. Pt has neuropathy in legs. Pt back in bed now. ?

## 2021-08-12 NOTE — ED Triage Notes (Signed)
Pt presents to ER via ems from home after wife called ems when she heard pt stirring in the bathroom tonight.  Wife went in bathroom and checked husbands CBG and was 87.  Pt given oral glucose by wife and pt proceeded to get up off the toilet, and fell on right side.  Pt initially refused care when ems arrived, but pt then had a syncopal episode that lasted appx 3 minutes.  Pt woke up after ems took him outside and refused care again.  When ems took pt back inside, he had a second syncopal episode, and woke up shortly after and decided to go to hospital.  Per wife, pt has been eating less, and been more weak and less willing to do things at home for last 2 weeks.  Pt currently A&O x4 at this time in NAD.  Ems states last CBG was 133.  Pt has hx of prior CVA and right kidney transplant.   ?

## 2021-08-12 NOTE — ED Notes (Signed)
Walked pt again with other nurse. Pt did better this time. Pt walked around room, now sitting up in bed. EDP informed. ?

## 2021-08-12 NOTE — ED Provider Notes (Signed)
? ?[facility] ?Provider Note ? ? ? Event Date/Time  ? First MD Initiated Contact with Patient 08/12/21 [phone]   ?  (approximate) ? ? ?History  ? ?Fall and Hypoglycemia ? ? ?HPI ? ?Larry Bright is a 76 y.o. male with diabetes, fracture of the right humerus, atrial fibrillation on warfarin, kidney transplant who comes in with concerns for fall.  Patient reports that he got up to use the toilet when he started to feel shaky like his sugar was low and he was trying to find food to take to help with his sugar when he could not get anything open so he called his wife.  His wife got there he was sitting on the toilet and she gave him a glucose packet and he went to stand up shortly after and he was extremely weak and lowered himself to the ground.  He is denies syncopal episodes himself but there was concern from EMS that patient had a syncopal episode.  Patient reports that he has not been eating as much because has been trying to lose weight and he has been taking his same amount of insulin.  He is on insulin 3 times a day 18 in the morning 8 at lunch 6 at night.  He does report that he missed his lunchtime dose yesterday but did take his nighttime dose with his sugar was 180 but he only ate 1 slice of pizza.  He reports that he actually has a appointment coming up to discuss his insulin regimen because they are going to change it around.  Patient reports that he feels at his baseline self at this time.  Does have a known right femoral head fracture and he states that when he did fall he fell directly onto this and reporting increasing pain in the arm. ? ? ?Physical Exam  ? ?Triage Vital Signs: ?ED Triage Vitals  ?Enc Vitals Group  ?   BP 08/12/21 0620 (!) 129/49  ?   Pulse Rate 08/12/21 0620 73  ?   Resp 08/12/21 0620 18  ?   Temp 08/12/21 0620 98.6 ?F (37 ?C)  ?   Temp Source 08/12/21 0620 Oral  ?   SpO2 08/12/21 0620 98 %  ?   Weight 08/12/21 0621 251 lb (113.9 kg)  ?   Height 08/12/21 0621  '6\' 1"'$  (1.854 m)  ?   Head Circumference --   ?   Peak Flow --   ?   Pain Score 08/12/21 0621 8  ?   Pain Loc --   ?   Pain Edu? --   ?   Excl. in [place]? --   ? ? ?Most recent vital signs: ?Vitals:  ? 08/12/21 0620  ?BP: (!) 129/49  ?Pulse: 73  ?Resp: 18  ?Temp: 98.6 ?F (37 ?C)  ?SpO2: 98%  ? ? ? ?General: Awake, no distress.  ?CV:  Good peripheral perfusion.  ?Resp:  Normal effort.  ?Abd:  No distention.  Soft nontender ?Other:  Patient's right arm is in a sling.  He reports tenderness on the arm and has good distal pulse.   ?Patient has no CTL spine tenderness.  No back tenderness  ? ? ?ED Results / Procedures / Treatments  ? ?Labs ?(all labs ordered are listed, but only abnormal results are displayed) ?Labs Reviewed  ?CBC WITH DIFFERENTIAL/PLATELET - Abnormal; Notable for the following components:  ?    Result Value  ? WBC 14.3 (*)   ?  Neutro Abs 12.1 (*)   ? Abs Immature Granulocytes 0.11 (*)   ? All other components within normal limits  ?COMPREHENSIVE METABOLIC PANEL - Abnormal; Notable for the following components:  ? Glucose, Bld 148 (*)   ? BUN 31 (*)   ? Creatinine, Ser 1.49 (*)   ? GFR, Estimated 49 (*)   ? All other components within normal limits  ?CBG MONITORING, ED - Abnormal; Notable for the following components:  ? Glucose-Capillary 144 (*)   ? All other components within normal limits  ?URINALYSIS, ROUTINE W REFLEX MICROSCOPIC  ?TROPONIN I (HIGH SENSITIVITY)  ? ? ? ?EKG ? ?My interpretation of EKG: ? ?Normal sinus rate of 71 without any ST elevation or T wave inversions, normal intervals ? ?RADIOLOGY ?I have reviewed the xrays personally.  X-ray of the wrist was negative for fracture.  X-ray of the elbow was negative.  X-ray of the shoulder does show fracture.  Pending read ? ? ?Evidence of proximal humeral fracture with no significant displacement. Xray on 2/24 ? ? ?PROCEDURES: ? ?Critical Care performed: No ? ?.1-3 Lead EKG Interpretation ?Performed by: Vanessa Ganado, MD ?Authorized by: Vanessa Fairfield, MD  ? ?  Interpretation: normal   ?  ECG rate:  60 ?  ECG rate assessment: normal   ?  Rhythm: sinus rhythm   ?  Ectopy: none   ?  Conduction: normal   ? ? ?MEDICATIONS ORDERED IN ED: ?Medications  ?oxyCODONE-acetaminophen (PERCOCET/ROXICET) 5-325 MG per tablet 1 tablet (1 tablet Oral Given 08/12/21 0645)  ? ? ? ?IMPRESSION / MDM / ASSESSMENT AND PLAN / ED COURSE  ?I reviewed the triage vital signs and the nursing notes. ?             ?               ? ?Differential diagnosis includes, but is not limited to, falls, weakness lives likely secondary to hypoglycemia.  Given patient did have a fall recently is on warfarin will get CT head evaluate for intracranial hemorrhage.  Will get labs to evaluate for any electrolyte abnormalities, ACS, UTI.  Will get repeat x-rays of the right arm to make sure no worsening fracture ? ?Cardiac troponins are negative x2.  Glucose is 144.  White count slightly elevated but he denies any other infectious symptoms.  Creatinine is at baseline. ? ?CT imaging is negative other than some incidental findings which I did discuss with family.  X-ray of the right shoulder shows maybe a little bit of worsening displacement which I did discuss the case with the orthopedic doctor who stated patient could f/u outpt for this.  ? ?Patient's repeat sugars in the 200s.  I attempted to ambulate patient his blood pressure with sitting was a lot lower so we will give 500 cc of fluid.  Patient reports feeling a lot better and would prefer to go home.  His white count was slightly elevated but he denies any other infectious symptoms and his urine is negative.  He denies any cough, congestion. ? ?Attempted to stand patient up for pressures, lower therefore patient was given 500 cc of fluid.  Nurse attempted to ambulate patient but he is very unsteady with walking given he is got neuropathy and he cannot use his cane due to his right arm being in a sling. ? ?I considered admission for patient given concern  for syncope, patient states that he is feeling at his normal self had reassuring EKG,  cardiac markers and I did offer patient admission but he states that he is feeling much better after the fluids and would like to go home.  We discussed his insulins, he is here for meal before taking them and that he has to do lower dose or skipping doses right now would rather his sugars 5 to low.  They feel very comfortable going home and declined admission.  They are going to follow-up with her orthopedic doctor for his shoulder and his x-ray.  But at this time they are requesting discharge home given he feels at his baseline self ? ? ? ?The patient is on the cardiac monitor to evaluate for evidence of arrhythmia and/or significant heart rate changes. ? ?FINAL CLINICAL IMPRESSION(S) / ED DIAGNOSES  ? ?Final diagnoses:  ?Fall, initial encounter  ?Hypoglycemia  ?Weakness  ? ? ? ?Rx / DC Orders  ? ?ED Discharge Orders   ? ? None  ? ?  ? ? ? ?Note:  This document was prepared using Dragon voice recognition software and may include unintentional dictation errors. ?  ?Vanessa Lott, MD ?08/12/21 1352 ? ?

## 2021-08-12 NOTE — ED Notes (Signed)
CBG 253 

## 2021-08-15 ENCOUNTER — Ambulatory Visit: Admit: 2021-08-15 | Discharge: 2021-08-16 | Payer: MEDICARE

## 2021-08-15 MED ORDER — OXYCODONE 5 MG TABLET
ORAL_TABLET | Freq: Two times a day (BID) | ORAL | 0 refills | 7 days | Status: CP | PRN
Start: 2021-08-15 — End: ?

## 2021-08-15 NOTE — Unmapped (Signed)
ORTHOPAEDIC NOTE     David Waldeck L. Yelitza Reach, PA-C        David Ellis    MRN: [id number]  DOB: Jan 14, 1946    Date of visit: 08/15/2021    Clinic location: [place]     ASSESSMENT:     Mild increased comminution of right proximal humerus fracture on 07/27/2021     PLAN:     I discussed with patient that his fracture still appears stable  He will add forward flexion exercises removing his sling a minimum of 3 times daily  He will start physical therapy but avoid strengthening exercises  New sling was provided  Refill of oxycodone was provided for pain  -Advised OTC analgesic PRN pain  -Discussed treatment options and patient was amenable to the above plan and was instructed to call and be seen if there is any increasing pain or concerns.     Follow up: 5 weeks, 3 views right shoulder       Chief Complaint:     Recheck right shoulder     SUBJECTIVE:     HPI: David Ellis is a RHD  76 y.o. 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  He has been in a sling working on shoulder pendulum exercises elbow range of motion exercises wrist and finger exercises.  Unfortunately he has had 2 falls since I saw him last.  He is sparingly taking oxycodone 1/2 tablet twice daily as well as Tylenol.       Allergies  Allergies   Allergen Reactions   ??? Levofloxacin Other (See Comments)     Other reaction(s): Unknown  Unknown   ??? Lidocaine Other (See Comments)     I don't remember States is fine with Bupivicaine   ??? Penicillins      As child  Other reaction(s): UNKNOWN.   (UPDATE: Tolerated amp/sulbactam without side effects during 10/2014 hospitalization)   ??? Egg Derived Rash   ??? Enalapril Cough   ??? Epinephrine Palpitations   ??? Grass Pollen-[place], Standard Itching   ??? Lisinopril Rash   ??? Mepivacaine Hcl Palpitations     Past Medical History  Past Medical History:   Diagnosis Date   ??? Atrial flutter (CMS-HCC)    ??? Diabetes mellitus (CMS-HCC)    ??? Diabetic nephropathy (CMS-HCC)    ??? Diabetic retinopathy (CMS-HCC)    ??? Fractures    ??? Ganglion cyst    ??? Hand injury    ??? Heart disease    ??? Hyperlipidemia    ??? Hypertension    ??? Joint pain    ??? Osteomyelitis (CMS-HCC) June 2016   ??? Pulmonary embolism (CMS-HCC) Dec 2015   ??? 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 Stroke (CMS-HCC)    ??? Tear of meniscus of knee    ??? Transplanted kidney 08/21/2011        PHYSICAL EXAM:     MSK: Right shoulder  Inspection: Proximal humeral edema/ecchymosis no erythema, skin intact  Palpation: Tenderness on the proximal humerus, no significant tenderness on the elbow  ROM: Full range of motion elbow able to forward flex his shoulder 20 degrees  Strength: Full strength each plane range of motion of the elbow,  pain limits strength in the shoulder  normal sensation RUE  radial pulses easily palpable     Imaging   Four views of the right Shoulder independently reviewed and interpreted by myself show Mild increased comminution and impaction of proximal humerus fracture. No other obvious fractures, lucencies, dislocations, or acute abnormalities.    MEDICAL DECISION MAKING (level of service defined by 2/3 elements)     Number/Complexity of Problems Addressed 1 acute, uncomplicated illness or injury (99203/99213)   Amount/Complexity of Data to be Reviewed/Analyzed Independent interpretation of a test performed by another physician/other qualified health care professional (99204/99214)   Risk of Complications/Morbidity/Mortality of Management Prescription Medication (99204/99214)   DME ORDER:  Dx: S42.291D, Other closed displaced fracture of proximal end of right humerus with routine healing, subsequent encounter  Dispense at Surgery: No  Location: [facility]  Body Location: Shoulder and Arm  Should and Arm Orthotics: Arm Sling  Laterality: Right  Print for Patient: No    DME Upper Extremity,  , The patient was prescribed this orthosis to be used on the upper extremity for the purpose of reducing pain and providing support and protection.           cc:  [name], MD  *Patient note was created using Dragon Dictation sotware. Errors in syntax or grammar may not have been identified and edited on initial review.

## 2021-08-18 NOTE — Unmapped (Signed)
DIABETES SUPPORT AND EDUCATION NOTE    Referring Provider:  Leretha Pol, MD    Time In / Out: 11:36am/12:00pm    Assessment:      David Ellis is a 76 y.o. male here for diabetes support and education. Patient with Diabetes Mellitus Type 2 on GLP1-RA and 3 or more insulin injections per day. Most recent A1c is 8.6 on 07/28/2021. Has 1 more via then switching to new insulin regimen. Patient also recently broke arm and has open wound on tailbone. Glucose patterns show nocturnal hypoglycemia and hyperglycemia r/t broken arm/procedures.      Time spent with patient was about: 30 minutes    Plan:     Education Intervention:  Educated on glucagon options-recommended they get, recommended they discuss coverage with pharmacist over financial concerns  Reviewed how pen needles  Answered all questions new insulin regimen: interactions, when to take, etc.    Educator Recommendations / Plan for Supervising Physician:  Reiterated Dr. Tiburcio Pea recommendation:  When you are out of u500 insulin:   Start once daily Tresiba 80units. If you have fasting glucoses <90, reduce by 5units at a time.   If your fasting glucoses > 150, we will need to discuss increasing your dose of Tresiba.  Start Novolog before meals.  I suspect you will need somewhere between 15-25units with meals. (recommended start with 15 units)      Follow up: I recommend patient follow-up with me in 3 months.  Seeing Dr. Tiburcio Pea in May.    Subjective:     Client Information/Psychosocial Hx/Barriers  Lives with wife.      Diabetes History  Diabetes Diagnosis: T2DM dx 1992, insulin started roughly 2000  Prior education:      Diabetes Medications  u500 insulin which he tries to give before meals.  Bfast 19 clicks (95units).  Lunch 8 clicks (40units) -- If glucose < 100, reduces 50%.  Dinner 6 clicks (10units) -- If glucose < 100, reduces 50%.   If glucose is <150 at bedtime, have low carb snack such as handful nuts, piece cheese, spoon of PNbutter. Trulicity 1.5mg  weekly.  No reason to increase dose with reduced appetite.    When you are out of u500 insulin:   Start once daily [place] 80units. If you have fasting glucoses <90, reduce by 5units at a time.   If your fasting glucoses > 150, we will need to discuss increasing your dose of Tresiba.  Start Novolog before meals.  I suspect you will need somewhere between 15-25units with meals.      Monitoring/CGM Data:   Uses Freestyle libre 14 day CGM              Nutrition:  Not reviewed today.      Exercise:  None      Acute Complications:  Low/High Blood Glucose Concerns: had ER visit recently for hypoglcyemia, did not have glucagon  Hypoglycemia treatment: juice, glucose tabs  Has Emergency Glucagon: No      Diabetes Related Complications:   hx of kidney transplant 08/2011 2nd to hx of ESRD. 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      Objective:     Past Medical History:   Diagnosis Date   ??? Atrial flutter (CMS-HCC)    ??? Diabetes mellitus (CMS-HCC)    ??? Diabetic nephropathy (CMS-HCC)    ??? Diabetic retinopathy (CMS-HCC)    ??? Fractures    ??? Ganglion cyst    ???  Hand injury    ??? Heart disease    ??? Hyperlipidemia    ??? Hypertension    ??? Joint pain    ??? Osteomyelitis (CMS-HCC) June 2016   ??? Pulmonary embolism (CMS-HCC) Dec 2015   ??? 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 Stroke (CMS-HCC)    ??? Tear of meniscus of knee    ??? Transplanted kidney 08/21/2011       No diagnosis found.     Lab Review:    Relevant Labs  HB A1C, RAP/HGATE   Date/Time Value Ref Range Status   04/07/2012 09:49 AM 6.7 (H) 4.8 - 6.0 % Final     Comment:     Performed by:  [facility]  [address]  [address]  [place], [place]  [address]   03/30/2011 09:58 AM 7.4 (H) 4.8 - 6.0 % Final     Comment:     Performed by:  [facility]  [address]  [address]  [place], [place]  [address]     HGB A1C, POC   Date/Time Value Ref Range Status 07/28/2020 09:56 AM 8.8 (H) <7.0 % Final     Comment:     A1c Glycemic Goal: <7.0%     **Goals should be individualized; more or less stringent A1c glycemic goals may be appropriate for individual patients.      (Adopted from: 2020 ADA Standards of Medical Care In Diabetes)  Point of Care A1c testing is not FDA-approved for the diagnosis of Diabetes.   01/27/2020 02:44 PM 7.3 (H) <7.0 % Final     Comment:     A1c Glycemic Goal: <7.0%     **Goals should be individualized; more or less stringent A1c glycemic goals may be appropriate for individual patients.      (Adopted from: 2020 ADA Standards of Medical Care In Diabetes)  Point of Care A1c testing is not FDA-approved for the diagnosis of Diabetes.     Hemoglobin A1c   Date/Time Value Ref Range Status   05/17/2014 09:26 AM 7.6 (H) 4.8 - 5.6 % Final     Comment:              Pre-diabetes: 5.7 - 6.4           Diabetes: >6.4           Glycemic control for adults with diabetes: <7.0       Hemoglobin A1C   Date/Time Value Ref Range Status   07/28/2021 08:18 AM 8.6 (H) 4.8 - 5.6 % Final   06/08/2021 08:13 AM 9.0 (H) 4.8 - 5.6 % Final           HB A1C, RAP/HGATE (%)   Date Value   04/07/2012 6.7 (H)   03/30/2011 7.4 (H)     HGB A1C, POC (%)   Date Value   07/28/2020 8.8 (H)   01/27/2020 7.3 (H)   11/18/2018 9.5 (H)   04/24/2018 9.3 (H)     Hemoglobin A1c (%)   Date Value   05/17/2014 7.6 (H)     Hemoglobin A1C (%)   Date Value   07/28/2021 8.6 (H)   06/08/2021 9.0 (H)   04/07/2021 9.4 (H)   03/07/2021 8.9 (H)           Alita Chyle, MS, RD, LD, CDCES

## 2021-08-21 ENCOUNTER — Ambulatory Visit: Admit: 2021-08-21 | Discharge: 2021-08-21 | Payer: MEDICARE

## 2021-08-21 ENCOUNTER — Institutional Professional Consult (permissible substitution): Admit: 2021-08-21 | Discharge: 2021-08-21 | Payer: MEDICARE

## 2021-08-21 DIAGNOSIS — L89302 Pressure ulcer of unspecified buttock, stage 2: Principal | ICD-10-CM

## 2021-08-21 DIAGNOSIS — L8990 Pressure ulcer of unspecified site, unspecified stage: Principal | ICD-10-CM

## 2021-08-21 MED ORDER — DOXYCYCLINE HYCLATE 100 MG CAPSULE
ORAL_CAPSULE | Freq: Two times a day (BID) | ORAL | 0 refills | 10 days | Status: CP
Start: 2021-08-21 — End: 2021-08-31

## 2021-08-21 NOTE — Unmapped (Addendum)
Thank you for letting us take care of you today! As we discussed, we have prescribed an antibiotic to help prevent any infection. We recommend trying to use a donut pillow to take the pressure off of your buttocks. We have also placed a referral to wound care and they should be calling you soon.    For at home wound care, we recommend you clean it daily with warm water and soap. Then apply a thick layer of either vaseline or bacitracin to the area. This should be protected with a nonstick gauze.     If you notice any increased pain, redness, drainage, swelling or fever please go to the emergency room.

## 2021-08-21 NOTE — Unmapped (Signed)
[facility]gent [facility] Provider Note      Clinical Impression     Final diagnoses:   Pressure injury of buttock, stage 2, unspecified laterality (CMS-HCC) (Primary)       Initial Impression, UC Course, Assessment and Plan   76 y.o. male with a PMH of osteomyelitis, PE, squamous cell skin cancer, a flutter, diabetes mellitus, hyperlipidemia, hypertension, stroke, OSA presenting to the UC for concern for pressure wound ulcers.    Upon exam, patient appears nontoxic and in NAD. VSS. HRRR. Respirations equal and unlabored. Patient has sling on right arm from humerus fracture. Able to transition from sitting to standing with assistance. 2x3 cm and 3x4 cm pressure ulcers noted to bilateral buttocks near top of cleft. There is some purplish discoloration around the ulcers. No adipose layer or bone involvement. No drainage noted.     I discussed with patient and wife plan to schedule appt with wound care as well as start on doxy to help prevent/treat possible early cellulitis. Wound was cleaned and dressed with bacitrWe discussed proper wound care, benefit of donut pillow to help alleviate pressure as well as strict return. Patient and wife expressed understanding he agreed to plan. Patient discharged in stable condition.     I scheduled patient a wound care appt on 08/23/21 at 11am with [place] [facility].     Berton Lan, PA       Labs     No results found for this visit on 08/21/21.    Radiology     No results found.    History     Chief Complaint  bed sores (Bilateral buttocks, x 2 weeks, patient has broken right arm that is limiting mobility to get into the bed so he has been sitting in a lift chair, the skin is broken)      HPI   Patient was seen by me at 9:45 AM.    Patient is a 76 y.o. male with a PMH of osteomyelitis, PE, squamous cell skin cancer, a flutter, diabetes mellitus, hyperlipidemia, hypertension, stroke, OSA presenting to the UC for concern for pressure wound ulcers. Patient feel on 07/28/21 and broke his humerus. Since then he has been sleeping in a lift chair as he is unable to roll over or position in bed due to pain. He has developed pressure ulcers which his wife has been cleaning and dressing with silvadene. She noticed the skin is now more ulcerated and surrounding skin is purplish. Patient notes the area is a little tender. Denies any previous pressure ulcers but patient has a history of osteo in his toes from diabetic neuropathy. Denies any fever, discharge, nausea, vomiting or other physical complaints.     Previous chart, nursing notes, and vital signs reviewed.      Pertinent labs & imaging results that were available during my care of the patient were reviewed by me and considered in my medical decision making (see chart for details).    Portions of this record have been created using Scientist, clinical (histocompatibility and immunogenetics). Dictation errors have been sought, but may not have been identified and corrected.    Past Medical History:   Diagnosis Date   ??? Atrial flutter (CMS-HCC)    ??? Diabetes mellitus (CMS-HCC)    ??? Diabetic nephropathy (CMS-HCC)    ??? Diabetic retinopathy (CMS-HCC)    ??? Fractures    ??? Ganglion cyst    ??? Hand injury    ??? Heart disease    ??? Hyperlipidemia    ???  Hypertension    ??? Joint pain    ??? Osteomyelitis (CMS-HCC) June 2016   ??? Pulmonary embolism (CMS-HCC) Dec 2015   ??? 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 Stroke (CMS-HCC)    ??? Tear of meniscus of knee    ??? 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 NEPHRECTOMY TRANSPLANTED ORGAN Right     LURD - spouse received in 2013.   ??? PR AMPUTATION METATARSAL+TOE,SINGLE Left 07/06/2014    Procedure: AMPUTATION, METATARSAL, WITH TOE SINGLE;  Surgeon: Marion Downer, MD;  Location: MAIN [facility];  Service: Vascular   ??? PR AMPUTATION METATARSAL+TOE,SINGLE Left 10/28/2014    Procedure: AMPUTATION, METATARSAL, WITH TOE SINGLE;  Surgeon: Maple Mirza, MD; Location: MAIN OR [facility];  Service: Vascular   ??? PR VITRECTOMY,PANRETINAL LASER RX Right 11/26/2016    Procedure: VITRECTOMY, MECHANICAL, PARS PLANA APPROACH; WITH ENDOLASER PANRETINAL PHOTOCOAGULATION;  Surgeon: Melvyn Novas, MD;  Location[facility];  Service: Ophthalmology   ??? PR VITRECTOMY,PANRETINAL LASER RX Left 01/28/2017    Procedure: VITRECTOMY, MECHANICAL, PARS PLANA APPROACH; WITH ENDOLASER PANRETINAL PHOTOCOAGULATION;  Surgeon: Melvyn Novas, MD;  Location: [facility] OR [facility];  Service: Ophthalmology   ??? SKIN BIOPSY           Current Outpatient Medications:   ???  aspirin (ECOTRIN) 81 MG tablet, Take 1 tablet (81 mg total) by mouth daily as needed (when flying)., Disp: , Rfl:   ???  blood-glucose meter kit, Use as instructed., Disp: 1 each, Rfl: 0  ???  cholecalciferol, vitamin D3-50 mcg, 2,000 unit,, 50 mcg (2,000 unit) tablet, Take 1 tablet (50 mcg total) by mouth daily., Disp: , Rfl:   ???  flash glucose sensor (FREESTYLE LIBRE 14 DAY SENSOR) kit, by Other route every fourteen (14) days., Disp: 2 each, Rfl: 11  ???  furosemide (LASIX) 40 MG tablet, Take 1-2 tablets daily as needed for edema. (Patient taking differently: 1 tablet (40 mg total). 40 mg daily. And 1 additional tablet for edema.), Disp: 180 tablet, Rfl: 3  ???  gabapentin (NEURONTIN) 300 MG capsule, TAKE 1 CAPSULE BY MOUTH TWICE DAILY AS DIRECTED (Patient taking differently: Take 1 capsule (300 mg total) by mouth two (2) times a day.), Disp: 180 capsule, Rfl: 3  ???  HUMULIN R U-500, CONC, KWIKPEN 500 unit/mL (3 mL) CONCENTRATED injection, DIAL TO MAX 100 UNITS BEFORE BREAKFAST, LUNCH AND DINNER. INJECT 30 MINUTES BEFORE MEALS. MAX OF 300 UNITS DAILY. (Patient taking differently: Per sliding scale), Disp: 18 mL, Rfl: 11  ???  insulin ASPART (NOVOLOG FLEXPEN U-100 INSULIN) 100 unit/mL (3 mL) injection pen, 15-25 units with meals + correction. Max 100units daily., Disp: 30 mL, Rfl: 12  ???  insulin degludec 200 unit/mL (3 mL) InPn, Inject 0.4 mL (80 Units total) under the skin daily. Titrate up to 100units daily as instructed., Disp: 30 mL, Rfl: 12  ???  losartan (COZAAR) 25 MG tablet, TAKE ONE TABLET BY MOUTH EVERY DAY, Disp: 90 tablet, Rfl: 3  ???  magnesium chloride (SLOW_MAG) 64 mg TbEC, Take 128 mg by mouth three (3) times a day (at 6am, noon and 6pm). (2 tablets) 9am 1 pm and 9 pm, Disp: , Rfl:   ???  metoprolol succinate (TOPROL-XL) 100 MG 24 hr tablet, TAKE ONE TABLET TWICE DAILY, Disp: 180 tablet, Rfl: 1  ???  miscellaneous medical supply Misc, 1 application by Miscellaneous route daily as needed. Lightweight wheelchair., Disp: 1 each, Rfl: 0  ???  miscellaneous medical supply Misc, 1 application by Miscellaneous route nightly. Recommend CPAP 14 cm H2O with EPR 3 and heated humidifier for nasal dryness, mask: ResMed Activa Lt nasal mask (Large)., Disp: 1 each, Rfl: 0  ???  multivitamin capsule, Take 1 capsule by mouth daily., Disp: , Rfl:   ???  MYFORTIC 180 mg EC tablet, Take 3 tablets (540 mg total) by mouth two (2) times a day., Disp: 540 tablet, Rfl: 3  ???  oxyCODONE (ROXICODONE) 5 MG immediate release tablet, Take 1 tablet (5 mg total) by mouth two (2) times a day as needed for pain., Disp: 14 tablet, Rfl: 0  ???  pen needle, diabetic (BD ULTRA-FINE NANO PEN NEEDLE) 32 gauge x 5/32 (4 mm) Ndle, 1 each by Other route Three (3) times a day before meals., Disp: 300 each, Rfl: 3  ???  pravastatin (PRAVACHOL) 20 MG tablet, TAKE ONE TABLET BY MOUTH EVERY DAY, Disp: 90 tablet, Rfl: 3  ???  tacrolimus (PROGRAF) 1 MG capsule, Take 3 capsules (3 mg total) by mouth in the morning AND 2 capsules (2 mg total) in the evening., Disp: 450 capsule, Rfl: 3  ???  TRULICITY 1.5 mg/0.5 mL PnIj, INJECT 1.5MG (0.5ML) SUBCUTANEOUSLY ONCE A WEEK, Disp: 2 mL, Rfl: 11  ???  warfarin (JANTOVEN) 2.5 MG tablet, TAKE 1 TABLET BY MOUTH ONCE DAILY, Disp: 30 tablet, Rfl: 11  ???  warfarin (JANTOVEN) 5 MG tablet, Take 1 tablet (5 mg total) by mouth daily with evening meal. TAKE 6 DAYS A WEEK - Tuesday through Sunday, Disp: 90 tablet, Rfl: 3  ???  doxycycline (VIBRAMYCIN) 100 MG capsule, Take 1 capsule (100 mg total) by mouth Two (2) times a day for 10 days., Disp: 20 capsule, Rfl: 0  No current facility-administered medications for this visit.    Facility-Administered Medications Ordered in Other Visits:   ???  bevacizumab, , , PRN (once a day), Melvyn Novas, MD, 2.5 mg at 01/28/17 0750    Allergies  Egg derived; Enalapril; Epinephrine; Grass pollen-bermuda, standard; Levofloxacin; Lisinopril; Mepivacaine hcl; and Penicillins    Family History   Problem Relation Age of Onset   ??? Kidney disease Mother    ??? Diabetes Mother    ??? Kidney disease Father    ??? Kidney disease Maternal Grandmother    ??? Diabetes Maternal Grandmother    ??? Glaucoma Neg Hx    ??? Macular degeneration Neg Hx    ??? Strabismus Neg Hx        Social History  Social History     Tobacco Use   ??? Smoking status: Never     Passive exposure: Never   ??? Smokeless tobacco: Never   Vaping Use   ??? Vaping Use: Never used   Substance Use Topics   ??? Alcohol use: No   ??? Drug use: No       Screening:      PHQ-2 Score: 4    PHQ-9 Score: 4    Edinburgh Score:      Screening complete, no depression identified / no further action needed today  Patient noted little interest in doing things due to mobility difficulties. No SI, HI or AVH.     Review of Systems    Review of Systems   Constitutional: Negative for chills, diaphoresis, fever and malaise/fatigue.   HENT: Negative for congestion, ear pain and  sore throat.    Eyes: Negative for blurred vision, double vision, photophobia and pain.   Respiratory: Negative for cough and shortness of breath.    Cardiovascular: Negative for chest pain, palpitations and leg swelling.   Gastrointestinal: Negative for abdominal pain, diarrhea, nausea and vomiting.   Musculoskeletal: Negative for back pain, joint pain, myalgias and neck pain.   Skin: Negative for rash.        Positive for pressure ulcers.    Neurological: Negative for dizziness, tingling, sensory change, speech change, focal weakness, weakness and headaches.   All other systems reviewed and are negative.      Physical Exam     VITAL SIGNS:    Vitals:    08/21/21 0958   BP: 114/66   Pulse: 73   Resp: 18   Temp: 36.7 ??C (98 ??F)   SpO2: 98%   Weight: (!) 113.9 kg (251 lb)   Height: 185.4 cm (6' 1)       Physical Exam  Vitals and nursing note reviewed.   Constitutional:       Appearance: Normal appearance.   Cardiovascular:      Rate and Rhythm: Normal rate and regular rhythm.   Pulmonary:      Effort: Pulmonary effort is normal.   Musculoskeletal:         General: Normal range of motion.   Skin:     Capillary Refill: Capillary refill takes less than 2 seconds.      Comments: 2x3 cm and 3x4 cm pressure ulcers noted to bilateral buttocks near top of cleft. There is some purplish discoloration around the ulcers. No adipose layer or bone involvement. No drainage noted.    Neurological:      General: No focal deficit present.      Mental Status: He is alert and oriented to person, place, and time. Mental status is at baseline.

## 2021-08-21 NOTE — Unmapped (Signed)
Libre uploaded into Care[employer] information officer.

## 2021-08-22 DIAGNOSIS — L8991 Pressure ulcer of unspecified site, stage 1: Principal | ICD-10-CM

## 2021-08-22 DIAGNOSIS — L8990 Pressure ulcer of unspecified site, unspecified stage: Principal | ICD-10-CM

## 2021-08-22 NOTE — Unmapped (Signed)
TRF UNOS form

## 2021-08-22 NOTE — Unmapped (Unsigned)
[place] Division of Vascular Surgery  [facility] and [facility] - Established Clinic Visit  [address]  [place], [place]  [address]    Past Medical History:   Diagnosis Date   ??? Atrial flutter (CMS-HCC)    ??? Diabetes mellitus (CMS-HCC)    ??? Diabetic nephropathy (CMS-HCC)    ??? Diabetic retinopathy (CMS-HCC)    ??? Fractures    ??? Ganglion cyst    ??? Hand injury    ??? Heart disease    ??? Hyperlipidemia    ??? Hypertension    ??? Joint pain    ??? Osteomyelitis (CMS-HCC) June 2016   ??? Pulmonary embolism (CMS-HCC) Dec 2015   ??? 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 Stroke (CMS-HCC)    ??? Tear of meniscus of knee    ??? Transplanted kidney 08/21/2011     HPI:   David Ellis is a 76 y.o.male with a PMH of renal transplant (on tacrolimus), T2DM, HTN, PAD, left 4th and 5th toe amputation and A fib (on warfarin) with previous diabetic foot ulcers of the left 1st, 2nd toes and heel. At his last visit on 11/23/20, his LLE wounds remained healed.    Today, he reports ***.    ROS: Denies recent fevers, shaking chills, or night sweats.             Denies any increased drainage or exudate from the wound.             No reports of increased pain from the wound.             No reports of increased edema of the lower extremity.             Denies any claudication or rest pain symptoms.             Denies any current shortness of breath or chest pain.             Remainder of 10 system review is negative except that mentioned in the history of present illness.    Physical Exam:  General: 76 y.o.  male in no acute distress in the clinic today.    There were no vitals filed for this visit.    Chest: No increased work of breathing on room air.  Cardiac: regular rate and rhythm.   Extremities: LLE ulcers remain healed. Edema well controlled.       Assessment:  76 y.o. male with PMH of renal transplant (on tacrolimus), DM II, HTN, PAD, left 4th/5th toe amputation and A fib (on warfarin).     Treatment Plan:  1. Continue with general cardiovascular health such as exercise on a stationary bike.  2. Do compression with stocking and flwhen edema occurs.   3. Continue medical management of CV risk factors per Dr. Dellis Filbert  4. RTC PRN.

## 2021-08-23 ENCOUNTER — Ambulatory Visit: Admit: 2021-08-23 | Payer: MEDICARE | Attending: Vascular Surgery | Primary: Vascular Surgery

## 2021-08-24 NOTE — Unmapped (Signed)
Chart note sent to [facility] via [employer].

## 2021-08-29 ENCOUNTER — Ambulatory Visit: Admit: 2021-08-29 | Payer: MEDICARE

## 2021-08-29 DIAGNOSIS — L89302 Pressure ulcer of unspecified buttock, stage 2: Principal | ICD-10-CM

## 2021-08-29 NOTE — Unmapped (Signed)
Initial Visit Note         Assessment:    Pleasant, comfortable 76 year old Caucasian male, status post renal transplant and marginally controlled diabetic working actively with endocrinology (most recent A1c in mid eights, recent insulin change) presenting with fairly recent history of what is now improving, stage II bilateral inner buttock ulcers, left somewhat more than right.    He had a traumatic fall in February, sustained right humeral fracture at that time and has since had difficulty with mobility, including in and out of bed requiring him to sleep in a lift chair and to spend a fair amount of his day there as well.  This is a significant interval change from his routine level of activity.  He is working with physical therapy currently on his arm issue and feels that his throat certainly improving    He is status post doxycycline course and is currently using Vaseline topically    From a wound standpoint we are seeing a noninfected minimally draining superficial clean wound pristine after focal subcutaneous debridement today with little evidence of surrounding tissue trauma or maceration, acute inflammatory or infectious issues.        Plan:    As above  Pathophysiology discussed, including primacy of minimizing offloading (currently beginning positional shifts, doughnut use) and discussion of additional means of doing so  Acknowledges diminished appetite, nutritional concerns discussed  Ongoing diabetic control  Continue to work with PT  No additional need for antibiotics at this point    No additional diagnostic work-up now indicated    We will use Calmoseptine topically for its moisturizing, protection.  Minimizing overlying dressing discussed as well    Patient and his wife are comfortable with our discussion today and likely plans for follow-up on an as-needed basis        History of Present Illness:    Reason for Consult: Stage II pressure ulcer inner buttock    David Ellis is a 76 y.o. male who is seen in consultation at the request of Odis Luster, PA for evaluation of same.  Onset of symptoms was gradual.  The symptoms are located in the  as outlined above .  The patient describes the symptoms as dull.  The symptoms are intermittent.  The symptoms don't radiate.  The patient rates the symptoms as mild.     See assessment, plan above for considerable additional HPI information      Allergies    Egg derived; Enalapril; Epinephrine; Grass pollen-bermuda, standard; Levofloxacin; Lisinopril; Mepivacaine hcl; and Penicillins      Medications      Current Outpatient Medications   Medication Sig Dispense Refill    aspirin (ECOTRIN) 81 MG tablet Take 1 tablet (81 mg total) by mouth daily as needed (when flying).      blood-glucose meter kit Use as instructed. 1 each 0    cholecalciferol, vitamin D3-50 mcg, 2,000 unit,, 50 mcg (2,000 unit) tablet Take 1 tablet (50 mcg total) by mouth daily.      doxycycline (VIBRAMYCIN) 100 MG capsule Take 1 capsule (100 mg total) by mouth Two (2) times a day for 10 days. 20 capsule 0    flash glucose sensor (FREESTYLE LIBRE 14 DAY SENSOR) kit by Other route every fourteen (14) days. 2 each 11    furosemide (LASIX) 40 MG tablet Take 1-2 tablets daily as needed for edema. (Patient taking differently: 1 tablet (40 mg total). 40 mg daily. And 1 additional tablet for edema.) 180  tablet 3    gabapentin (NEURONTIN) 300 MG capsule TAKE 1 CAPSULE BY MOUTH TWICE DAILY AS DIRECTED (Patient taking differently: Take 1 capsule (300 mg total) by mouth two (2) times a day.) 180 capsule 3    HUMULIN R U-500, CONC, KWIKPEN 500 unit/mL (3 mL) CONCENTRATED injection DIAL TO MAX 100 UNITS BEFORE BREAKFAST, LUNCH AND DINNER. INJECT 30 MINUTES BEFORE MEALS. MAX OF 300 UNITS DAILY. (Patient taking differently: Per sliding scale) 18 mL 11    insulin ASPART (NOVOLOG FLEXPEN U-100 INSULIN) 100 unit/mL (3 mL) injection pen 15-25 units with meals + correction. Max 100units daily. 30 mL 12    insulin degludec 200 unit/mL (3 mL) InPn Inject 0.4 mL (80 Units total) under the skin daily. Titrate up to 100units daily as instructed. 30 mL 12    losartan (COZAAR) 25 MG tablet TAKE ONE TABLET BY MOUTH EVERY DAY 90 tablet 3    magnesium chloride (SLOW_MAG) 64 mg TbEC Take 128 mg by mouth three (3) times a day (at 6am, noon and 6pm). (2 tablets) 9am 1 pm and 9 pm      metoprolol succinate (TOPROL-XL) 100 MG 24 hr tablet TAKE ONE TABLET TWICE DAILY 180 tablet 1    miscellaneous medical supply Misc 1 application by Miscellaneous route daily as needed. Lightweight wheelchair. 1 each 0    miscellaneous medical supply Misc 1 application by Miscellaneous route nightly. Recommend CPAP 14 cm H2O with EPR 3 and heated humidifier for nasal dryness, mask: ResMed Activa Lt nasal mask (Large). 1 each 0    multivitamin capsule Take 1 capsule by mouth daily.      MYFORTIC 180 mg EC tablet Take 3 tablets (540 mg total) by mouth two (2) times a day. 540 tablet 3    oxyCODONE (ROXICODONE) 5 MG immediate release tablet Take 1 tablet (5 mg total) by mouth two (2) times a day as needed for pain. 14 tablet 0    pen needle, diabetic (BD ULTRA-FINE NANO PEN NEEDLE) 32 gauge x 5/32 (4 mm) Ndle 1 each by Other route Three (3) times a day before meals. 300 each 3    pravastatin (PRAVACHOL) 20 MG tablet TAKE ONE TABLET BY MOUTH EVERY DAY 90 tablet 3    tacrolimus (PROGRAF) 1 MG capsule Take 3 capsules (3 mg total) by mouth in the morning AND 2 capsules (2 mg total) in the evening. 450 capsule 3    TRULICITY 1.5 mg/0.5 mL PnIj INJECT 1.5MG (0.5ML) SUBCUTANEOUSLY ONCE A WEEK 2 mL 11    warfarin (JANTOVEN) 2.5 MG tablet TAKE 1 TABLET BY MOUTH ONCE DAILY 30 tablet 11    warfarin (JANTOVEN) 5 MG tablet Take 1 tablet (5 mg total) by mouth daily with evening meal. TAKE 6 DAYS A WEEK - Tuesday through Sunday 90 tablet 3     No current facility-administered medications for this visit.     Facility-Administered Medications Ordered in Other Visits Medication Dose Route Frequency Provider Last Rate Last Admin    bevacizumab    PRN (once a day) Melvyn Novas, MD   2.5 mg at 01/28/17 0750         Past Medical History    Past Medical History:   Diagnosis Date    Atrial flutter (CMS-HCC)     Diabetes mellitus (CMS-HCC)     Diabetic nephropathy (CMS-HCC)     Diabetic retinopathy (CMS-HCC)     Fractures     Ganglion cyst     Hand injury  Heart disease     Hyperlipidemia     Hypertension     Joint pain     Osteomyelitis (CMS-HCC) June 2016    Pulmonary embolism (CMS-HCC) Dec 2015    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    PR AMPUTATION METATARSAL+TOE,SINGLE Left 07/06/2014    Procedure: AMPUTATION, METATARSAL, WITH TOE SINGLE;  Surgeon: Marion Downer, MD;  Location: MAIN OR [facility];  Service: Vascular    PR AMPUTATION METATARSAL+TOE,SINGLE Left 10/28/2014    Procedure: AMPUTATION, METATARSAL, WITH TOE SINGLE;  Surgeon: Maple Mirza, MD;  Location: MAIN OR [facility];  Service: Vascular    PR VITRECTOMY,PANRETINAL LASER RX Right 11/26/2016    Procedure: VITRECTOMY, MECHANICAL, PARS PLANA APPROACH; WITH ENDOLASER PANRETINAL PHOTOCOAGULATION;  Surgeon: Melvyn Novas, MD;  Location: [facility] OR [facility];  Service: Ophthalmology    PR Hessie Knows LASER RX Left 01/28/2017    Procedure: VITRECTOMY, MECHANICAL, PARS PLANA APPROACH; WITH ENDOLASER PANRETINAL PHOTOCOAGULATION;  Surgeon: Melvyn Novas, MD;  Location: [facility] OR [facility];  Service: Ophthalmology    SKIN BIOPSY           Family History    Family History   Problem Relation Age of Onset    Kidney disease Mother     Diabetes Mother     Kidney disease Father     Kidney disease Maternal Grandmother     Diabetes Maternal Grandmother     Glaucoma Neg Hx     Macular degeneration Neg Hx     Strabismus Neg Hx          Social History:    Social History     Socioeconomic History    Marital status: Married   Tobacco Use    Smoking status: Never     Passive exposure: Never    Smokeless tobacco: Never   Vaping Use    Vaping Use: Never used   Substance and Sexual Activity    Alcohol use: No    Drug use: No   Other Topics Concern    Do you use sunscreen? Yes     Comment: long sleeves & floppy hat    Tanning bed use? No    Excessive sun exposure? No    Blistering sunburns? No   Social History Narrative    Works for the city of [employer].    Retired from the [employer].      Married.      Two healthy children.         Review of Systems    Review of Systems   Constitutional:  Positive for activity change and chills.   Respiratory:  Positive for apnea.    Musculoskeletal:  Positive for arthralgias, gait problem and neck pain.   Skin:  Positive for color change and wound.   Allergic/Immunologic: Positive for environmental allergies and immunocompromised state.   Neurological:  Positive for dizziness, syncope, weakness and light-headedness.   Hematological:  Bruises/bleeds easily.          Vital Signs    Resp 18  - Ht 185.4 cm (6' 1)  -  Wt (!) 113.9 kg (251 lb)  - BMI 33.12 kg/m??   Body mass index is 33.12 kg/m??.      Physical Exam    General Appearance:  No acute distress, well appearing and well nourished.   Head:  Normocephalic, atraumatic.   Eyes:  Conjuctiva and lids appear normal. Pupils equal and round,   sclera anicteric.   Ears:  Overall appearance normal with no scars, lesions or               masses.  Hearing is grossly normal.   Nose: Nares grossly normal, no drainage.   Throat: Lips, mucosa, and tongue normal; teeth and gums normal.   Neck: Supple, symmetrical, trachea midline, no adenopathy.   Pulmonary:    Normal respiratory effort.  Lungs were clear to auscultation  bilaterally.   Cardiovascular:  Regular rate and rhythm, no murmur noted.  No thrill noted.   Abdomen:   Soft, non-tender, without masses.  No                                      hepatosplenomegaly.    Musculoskeletal: Normal gait.  Extremities without clubbing, cyanosis, or           edema.   Skin: Skin color, texture, turgor normal, no rashes or lesions.   Neurologic: No motor abnormalities noted.  Sensation grossly intact.   Psychiatric:    Extremities: Judgement and insight appropriate.  Oriented to person,         place,  and time.  Feet warm with 2 second capillary refill.   Palpable pulses.  No varicose veins.  No significant edema.  No hyperpigmentation           Wound Assessment see wound data, photos, discussion above.  Minimal hyperemia, no cellulitis fluctuance or maceration    Wound 08/29/21 Buttocks (Active)   Wound Image   08/29/21 0840   Wound Status Not Healed 08/29/21 0833   Dressing Status      Soiled;Removed 08/29/21 0833   State of Healing Eschar 08/29/21 0833   DFU Grade [address]) N/A 08/29/21 0833   Wound Length (cm) 4.5 cm 08/29/21 0833   Wound Width (cm) 8.5 cm 08/29/21 0833   Wound Depth (cm) 0.1 cm 08/29/21 0833   Wound Surface Area (cm^2) 38.25 cm^2 08/29/21 0833   Wound Volume (cm^3) 3.825 cm^3 08/29/21 0833   Wound Bed Purple/maroon discoloration;Pink;Red 08/29/21 0833   Odor None 08/29/21 0833   Margins Attached edges;Defined edges 08/29/21 0833   Peri-wound Assessment      Clean;Dry;Intact;Callus 08/29/21 0833   Encounter Initial 08/29/21 0833   Progress Initial Exam 08/29/21 0833   Eschar % 76-100% 08/29/21 0833   Exudate Type      Sero-sanguineous 08/29/21 0833   Exudate Amnt      Moderate 08/29/21 0833   Thickness Eschar Covered 08/29/21 0833   Exposed Structure N/A 08/29/21 0833   Paring No 08/29/21 0833   Texture Normal For Patient 08/29/21 0833   Moisture Normal For Patient 08/29/21 0833   Temperature WNL 08/29/21 0833   S/S Infection No 08/29/21 0833   Infection Description & Treatment Plan Systemic Antibiotics Prescribed 08/29/21 0833   Local Pulse N/A 08/29/21 0833   Hypergranuation No 08/29/21 0833   Tunneling      No 08/29/21 0833   Undermining     No 08/29/21 0833   Sinus  Tract No 08/29/21 0833   Treatments Cleansed/Irrigation 08/29/21 0833   Picture Taken Yes 08/29/21 0833   Length (Pre Debridement) 4.5 cm 08/29/21 0833   Width (Pre Debridement) 8.5 cm 08/29/21 0833   Depth (Pre Debridement) 0.1 cm 08/29/21 0833   Level of Debridement subcutaneous tissue 08/29/21 0833   Length (Post-Debridement) 4.5 cm 08/29/21 0833   Width (Post Debridement) 8.5 cm 08/29/21 0833   Depth (Post-Debridement) 0.2 cm 08/29/21 0833   Area(Pre-Debridement) 38.25 sq cm 08/29/21 0833   Area(Post-Debridement) 38.25 sq cm 08/29/21 0833   Volume(Pre-Debridement) 3.83 sq cm 08/29/21 0833   Volume(Post-Debridement) 7.65 sq cm 08/29/21 1610            Test Results    All lab results, last 24 hours:  No results found for this or any previous visit (from the past 24 hour(s)).    Imaging:  None      Problem List    Patient Active Problem List   Diagnosis    Type II diabetes mellitus with renal manifestations, uncontrolled    Essential (primary) hypertension    History of kidney transplant    Mixed hyperlipidemia    Retinopathy due to secondary diabetes (CMS-HCC)    Transplanted kidney    Other pulmonary embolism without acute cor pulmonale (CMS-HCC)    Edema    Right ventricular dysfunction    Atrial fibrillation (CMS-HCC)    Aftercare following organ transplant    Diverticulosis of colon    Routine general medical examination at a health care facility    OSA on CPAP    Diabetic polyneuropathy associated with type 2 diabetes mellitus (CMS-HCC)    Diabetic ulcer of left foot associated with type 2 diabetes mellitus (CMS-HCC)    Toe amputation status    Left knee pain    BCC (basal cell carcinoma), face    Acquired absence of other toe(s), unspecified side (CMS-HCC)    History of colonic polyps    Type 2 diabetes mellitus with foot ulcer (CODE) (CMS-HCC)    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)    Hypoglycemia associated with type 2 diabetes mellitus (CMS-HCC)    Class 2 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    Immunosuppressed status (CMS-HCC)    Syncope    Non-healing open wound of toe    At high risk for falls

## 2021-08-29 NOTE — Unmapped (Addendum)
08/29/21    Please return to the wound clinic if your wound worsens/fails to improve or if a new wound appears    Wash wound with soap and water, pat dry well. Apply Calmoseptine daily.     If you experience signs and symptoms of infection: fever, aches, chills, lots of foul drainage, increased redness, call wound care center as soon as possible. If after hours, go to urgent care or emergency room.

## 2021-08-29 NOTE — Unmapped (Signed)
Pt arrived in stable condition.    Open Wound      Patient Goals    Off Loading  Nutrition  Wound Healing  Dressing Change Compliance    Interventions:    1. Follow appropriate dressing orders    2. Off load wound, change positions frequently    3. Eat healthy, increase protein with each meal    4. Control Blood Sugar          Patient education given on wound care and the patient expresses understanding and acceptance of instructions.

## 2021-08-29 NOTE — Unmapped (Signed)
Procedures        Wound #1 (posterior buttock)  Cause:  Pressure ulcer.  Complicating factors for healing:  pressure/shearing and malnutrition.   Measures taken:  offloading and increase protein intake.  Pre-debridement depth:  Skin.  Procedure:  debridement of subcutaneous tissue (11042).  Type:  excisional.  Anesthesia:  4% lidocaine.  Timeout was obtained.  Debridement performed using forceps and scalpel.  Tissue removed included biofilm, slough, eschar, fibrin, and subcutaneous fat.  Viable tissue was removed.  Bleeding:  scant.  Hemostasis achieved using manual pressure.  Pain during procedure rated 2/10.  Pain after procedure rated 0/10.  Tolerated well.  Area debrided:  4 sq.cm.                  Wound 08/29/21 Buttocks (Active)   Wound Image   08/29/21 0840   Wound Status Not Healed 08/29/21 0833   Dressing Status      Soiled;Removed 08/29/21 0833   State of Healing Eschar 08/29/21 0833   DFU Grade David Ellis) N/A 08/29/21 0833   Wound Length (cm) 4.5 cm 08/29/21 0833   Wound Width (cm) 8.5 cm 08/29/21 0833   Wound Depth (cm) 0.0 cm 08/29/21 0833   Wound Surface Area (cm^2) 38.25 cm^2 08/29/21 0833   Wound Volume (cm^3) 3.825 cm^3 08/29/21 0833   Wound Bed Purple/maroon discoloration;Pink;Red 08/29/21 0833   Odor None 08/29/21 0833   Margins Attached edges;Defined edges 08/29/21 0833   Peri-wound Assessment      Clean;Dry;Intact;Callus 08/29/21 0833   Encounter Initial 08/29/21 0833   Progress Initial Exam 08/29/21 0833   Eschar % 76-100% 08/29/21 0833   Exudate Type      Sero-sanguineous 08/29/21 0833   Exudate Amnt      Moderate 08/29/21 0833   Thickness Eschar Covered 08/29/21 0833   Exposed Structure N/A 08/29/21 0833   Paring No 08/29/21 0833   Texture Normal For Patient 08/29/21 0833   Moisture Normal For Patient 08/29/21 0833   Temperature WNL 08/29/21 0833   S/S Infection No 08/29/21 0833   Infection Description & Treatment Plan Systemic Antibiotics Prescribed 08/29/21 0833   Local Pulse N/A 08/29/21 0833   Hypergranuation No 08/29/21 0833   Tunneling      No 08/29/21 0833   Undermining     No 08/29/21 0833   Sinus Tract No 08/29/21 0833   Treatments Cleansed/Irrigation 08/29/21 0833   Picture Taken Yes 08/29/21 0833   Length (Pre Debridement) 4.5 cm 08/29/21 0833   Width (Pre Debridement) 8.5 cm 08/29/21 0833   Depth (Pre Debridement) 0.0 cm 08/29/21 0833   Level of Debridement subcutaneous tissue 08/29/21 0833   Length (Post-Debridement) 4.5 cm 08/29/21 0833   Width (Post Debridement) 8.5 cm 08/29/21 0833   Depth (Post-Debridement) 0.2 cm 08/29/21 0833   Area(Pre-Debridement) 38.25 sq cm 08/29/21 0833   Area(Post-Debridement) 38.25 sq cm 08/29/21 0833   Volume(Pre-Debridement) 3.83 sq cm 08/29/21 0833   Volume(Post-Debridement) 7.65 sq cm 08/29/21 1610

## 2021-08-31 NOTE — Unmapped (Signed)
[facility] Refill Coordination Note    Specialty Medication(s) to be Shipped:   Transplant: Myfortic 180mg  and tacrolimus 1mg     Other medication(s) to be shipped: No additional medications requested for fill at this time     David Ellis, DOB: Sep 19, 1945  Phone: [phone] (home)       All above HIPAA information was verified with patient.     Was a Nurse, learning disability used for this call? No    Completed refill call assessment today to schedule patient's medication shipment from the [facility] [phone]).  All relevant notes have been reviewed.     Specialty medication(s) and dose(s) confirmed: Regimen is correct and unchanged.   Changes to medications: David Ellis reports no changes at this time.  Changes to insurance: No  New side effects reported not previously addressed with a pharmacist or physician: None reported  Questions for the pharmacist: No    Confirmed patient received a Conservation officer, historic buildings and a Surveyor, mining with first shipment. The patient will receive a drug information handout for each medication shipped and additional FDA Medication Guides as required.       DISEASE/MEDICATION-SPECIFIC INFORMATION        N/A    SPECIALTY MEDICATION ADHERENCE     Medication Adherence    Patient reported X missed doses in the last month: 0  Specialty Medication: Myfortic 180mg   Patient is on additional specialty medications: Yes  Additional Specialty Medications: Tacrolimus 1mg   Patient Reported Additional Medication X Missed Doses in the Last Month: 0  Patient is on more than two specialty medications: No  Adherence tools used: patient uses a pill box to manage medications  Support network for adherence: family member              Were doses missed due to medication being on hold? No    Myfortic 180 mg: 7 days of medicine on hand   Tacrolimus 1 mg: 7 days of medicine on hand       REFERRAL TO PHARMACIST     Referral to the pharmacist: Not needed      [facility] Shipping address confirmed in Epic.     Delivery Scheduled: Yes, Expected medication delivery date: 09/05/21.     Medication will be delivered via Next Day Courier to the prescription address in Epic WAM.    David Ellis   [facility] Specialty Pharmacist

## 2021-08-31 NOTE — Unmapped (Signed)
Chart note sent to [facility] via [employer].

## 2021-09-04 MED FILL — MYFORTIC 180 MG TABLET,DELAYED RELEASE: ORAL | 30 days supply | Qty: 180 | Fill #9

## 2021-09-04 MED FILL — TACROLIMUS 1 MG CAPSULE, IMMEDIATE-RELEASE: ORAL | 30 days supply | Qty: 150 | Fill #4

## 2021-09-05 MED ORDER — TRULICITY 1.5 MG/0.5 ML SUBCUTANEOUS PEN INJECTOR
11 refills | 0 days | Status: CP
Start: 2021-09-05 — End: ?

## 2021-09-06 MED ORDER — LOSARTAN 25 MG TABLET
ORAL_TABLET | Freq: Every day | ORAL | 3 refills | 90 days | Status: CP
Start: 2021-09-06 — End: ?

## 2021-09-06 NOTE — Unmapped (Signed)
Received voice mail from patient stating [facility] is telling him and his wife  that a Prior Authorization is required for the Novolog. I called patient and he stated Dr Tiburcio Pea changed him to Novolog because the Humalog is not adequately controlling his blood sugar and she believes the Novolog will be better for him. Per chart noted of last office visit, 07/27/21:    Now that you will have $35 cap for monthly insulin, would like to change u500 to a basal/bolus regimen using concentrated Tresiba u200 for basal [to decrease volume of injection], and either Novolog/Humalog pens for bolus.  This will allow more stable basal glucose control, and more intuitive adjustment of prandial/correction. :Start Novolog before meals. I suspect you will need somewhere between 15-25units with meals.     Patient states he was also started on Tresiba. Message sent to [facility] for PA.

## 2021-09-06 NOTE — Unmapped (Signed)
Received call from Patient stating Dr Tiburcio Pea ordered Novolog out of medical necessity. And that Humalog was not working for him. The chart note reads: .Marland Kitchen..either Novolog/Humalog pens for bolus   For purposes of prior authorization, Dr Tiburcio Pea should put note in patient's medical record stating that the Novolog is medically necessary if that is the case, and for what reason(s), so prior authorization can be submitted. Message routed to Dr Tiburcio Pea

## 2021-09-07 MED ORDER — INSULIN LISPRO (U-100) 100 UNIT/ML SUBCUTANEOUS PEN
3 refills | 0 days | Status: CP
Start: 2021-09-07 — End: ?

## 2021-09-07 NOTE — Unmapped (Signed)
Formulary change from Novolog to Humalog.     Message sent to Dr. Tiburcio Pea to review.

## 2021-09-08 NOTE — Unmapped (Signed)
Reeceived voice mail from patient wife stating that patient does not need U-500 unit insulin. They only want Dr Tiburcio Pea to send prior authorization for the Novolog. She would like Eula Listen to call her now. Her husband has only enough insulin left for next few days. Message routed to [facility] for assist.

## 2021-09-15 NOTE — Unmapped (Signed)
I expect confusion was over his old insulin Humulin R u500 vs new rapid acting Humalog u100 (for which Novolog can be substituted).  Correct that he should no longer take Humulin R u500.  Correct that he should take [place] once daily.   Correct that he can take either rapid acting Humalog or Novolog with meals.

## 2021-09-19 ENCOUNTER — Ambulatory Visit: Payer: Medicare Other | Admitting: Dermatology

## 2021-09-19 NOTE — Unmapped (Signed)
I called patient for follow up and patient states he does not know how many pen  clicks equal a unit on his new [place] and [employer]. He was used to his previous insulin. Patient states he and his wife have met with a CDE and they still do not know. Per discussion with our Pharmacist, I let patient know to use the numbers on the dose reader to prepare correct dose. Patient states he has poor vision and will have his wife call us for more clarification.

## 2021-09-20 ENCOUNTER — Ambulatory Visit: Admit: 2021-09-20 | Discharge: 2021-09-20 | Payer: MEDICARE

## 2021-09-20 LAB — COMPREHENSIVE METABOLIC PANEL
ALBUMIN: 3.4 g/dL (ref 3.4–5.0)
ALKALINE PHOSPHATASE: 131 U/L — ABNORMAL HIGH (ref 46–116)
ALT (SGPT): 22 U/L (ref 10–49)
ANION GAP: 5 mmol/L (ref 5–14)
AST (SGOT): 24 U/L (ref <=34)
BILIRUBIN TOTAL: 0.7 mg/dL (ref 0.3–1.2)
BLOOD UREA NITROGEN: 19 mg/dL (ref 9–23)
BUN / CREAT RATIO: 17
CALCIUM: 9.1 mg/dL (ref 8.7–10.4)
CHLORIDE: 103 mmol/L (ref 98–107)
CO2: 29 mmol/L (ref 20.0–31.0)
CREATININE: 1.11 mg/dL — ABNORMAL HIGH
EGFR CKD-EPI (2021) MALE: 70 mL/min/{1.73_m2} (ref >=60)
GLUCOSE RANDOM: 282 mg/dL — ABNORMAL HIGH (ref 70–99)
POTASSIUM: 5.3 mmol/L — ABNORMAL HIGH (ref 3.4–4.8)
PROTEIN TOTAL: 6.7 g/dL (ref 5.7–8.2)
SODIUM: 137 mmol/L (ref 135–145)

## 2021-09-20 LAB — CBC W/ AUTO DIFF
BASOPHILS ABSOLUTE COUNT: 0 10*9/L (ref 0.0–0.1)
BASOPHILS RELATIVE PERCENT: 0.2 %
EOSINOPHILS ABSOLUTE COUNT: 0.1 10*9/L (ref 0.0–0.5)
EOSINOPHILS RELATIVE PERCENT: 1.3 %
HEMATOCRIT: 43.4 % (ref 39.0–48.0)
HEMOGLOBIN: 14.8 g/dL (ref 12.9–16.5)
LYMPHOCYTES ABSOLUTE COUNT: 1 10*9/L — ABNORMAL LOW (ref 1.1–3.6)
LYMPHOCYTES RELATIVE PERCENT: 13.5 %
MEAN CORPUSCULAR HEMOGLOBIN CONC: 34.2 g/dL (ref 32.0–36.0)
MEAN CORPUSCULAR HEMOGLOBIN: 28.7 pg (ref 25.9–32.4)
MEAN CORPUSCULAR VOLUME: 83.9 fL (ref 77.6–95.7)
MEAN PLATELET VOLUME: 7.6 fL (ref 6.8–10.7)
MONOCYTES ABSOLUTE COUNT: 0.5 10*9/L (ref 0.3–0.8)
MONOCYTES RELATIVE PERCENT: 6.9 %
NEUTROPHILS ABSOLUTE COUNT: 5.8 10*9/L (ref 1.8–7.8)
NEUTROPHILS RELATIVE PERCENT: 78.1 %
PLATELET COUNT: 195 10*9/L (ref 150–450)
RED BLOOD CELL COUNT: 5.17 10*12/L (ref 4.26–5.60)
RED CELL DISTRIBUTION WIDTH: 13.9 % (ref 12.2–15.2)
WBC ADJUSTED: 7.5 10*9/L (ref 3.6–11.2)

## 2021-09-20 LAB — LIPID PANEL
CHOLESTEROL/HDL RATIO SCREEN: 3.5 (ref 1.0–4.5)
CHOLESTEROL: 129 mg/dL (ref <=200)
HDL CHOLESTEROL: 37 mg/dL — ABNORMAL LOW (ref 40–60)
LDL CHOLESTEROL CALCULATED: 64 mg/dL (ref 40–99)
NON-HDL CHOLESTEROL: 92 mg/dL (ref 70–130)
TRIGLYCERIDES: 140 mg/dL (ref 0–150)
VLDL CHOLESTEROL CAL: 28 mg/dL (ref 12–42)

## 2021-09-20 LAB — PROTIME-INR
INR: 2.66
PROTIME: 31.3 s — ABNORMAL HIGH (ref 9.8–12.8)

## 2021-09-20 LAB — TACROLIMUS LEVEL, TIMED: TACROLIMUS BLOOD: 4.1 ng/mL

## 2021-09-20 LAB — HEMOGLOBIN A1C
ESTIMATED AVERAGE GLUCOSE: 194 mg/dL
HEMOGLOBIN A1C: 8.4 % — ABNORMAL HIGH (ref 4.8–5.6)

## 2021-09-20 LAB — TACROLIMUS LEVEL: TACROLIMUS BLOOD: 4 ng/mL

## 2021-09-20 LAB — MAGNESIUM: MAGNESIUM: 1.8 mg/dL (ref 1.6–2.6)

## 2021-09-20 LAB — PHOSPHORUS: PHOSPHORUS: 3.1 mg/dL (ref 2.4–5.1)

## 2021-09-20 NOTE — Unmapped (Signed)
ORTHOPAEDIC NOTE     Chamaine Stankus L. Mosiah Bastin, PA-C        David Ellis    MRN: [id number]  DOB: August 16, 1945    Date of visit: 09/20/2021    Clinic location: [place]     ASSESSMENT:     Healing right comminuted proximal humerus fracture on 07/27/2021     PLAN:     He may discontinue his sling  He will work on range of motion and start gentle strengthening exercises continue with therapy  He understands that he will never have full range of motion of the shoulder but ideally this would not cause functional deficit  He understands will be a minimum of 6 weeks before he can return to golf  -Advised OTC analgesic PRN pain  -Discussed treatment options and patient was amenable to the above plan and was instructed to call and be seen if there is any increasing pain or concerns.     Follow up: 6 weeks, 3 views right shoulder       Chief Complaint:     Recheck right shoulder     SUBJECTIVE:     HPI: David Ellis is a RHD 76 y.o. 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  He has been working on range of motion exercises of his shoulder using a sling.  He states overall he is doing well.  Most of his pain is actually in his elbow.  He is pleased with how therapy is progressing.       Allergies  Allergies   Allergen Reactions   ??? Egg Derived Rash   ??? Enalapril Cough   ??? Epinephrine Palpitations   ??? Grass Pollen-[place], Standard Itching   ??? Levofloxacin Other (See Comments)     Per patient   ??? Lisinopril Rash   ??? Mepivacaine Hcl Palpitations   ??? Penicillins Other (See Comments)     As child  Tolerated amp/sulbactam without side effects during 10/2014 hospitalization     Past Medical History  Past Medical History:   Diagnosis Date   ??? Arthritis    ??? Atrial flutter (CMS-HCC)    ??? Cataract    ??? Diabetes mellitus (CMS-HCC)    ??? Diabetic nephropathy (CMS-HCC)    ??? Diabetic retinopathy (CMS-HCC)    ??? Fractures    ??? Ganglion cyst    ??? Hand injury    ??? Heart disease    ??? Hyperlipidemia    ??? Hypertension    ??? Impaired mobility    ??? Joint pain    ??? Lack of access to transportation    ??? Osteomyelitis (CMS-HCC) 11/2014   ??? Pulmonary embolism (CMS-HCC) 05/2014   ??? Retinopathy due to secondary diabetes (CMS-HCC) 01/2013   ??? 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 Stroke (CMS-HCC)    ??? Tear of meniscus of knee    ??? Transplanted kidney 08/21/2011        PHYSICAL EXAM:     MSK: Right shoulder  Inspection: Mild edema and ecchymosis along the distal humerus without erythema, skin intact sensory along the lateral proximal humerus  Palpation: No significant tenderness when I palpate throughout the proximal humerus or elbow  ROM: Elbow range of motion 10 to 120 degrees of flexion, shoulder forward flexion 80, abduction 70  Strength: 3/5 strength abduction, 4/5 strength in internal and external rotation  normal sensation RUE  radial pulses easily palpable     Imaging   Four views of the right Shoulder independently reviewed and interpreted by myself show Increased callus summation with unchanged alignment of comminuted impacted proximal humerus fracture with sensory exterior to the skin adjacent to the proximal humerus. No other obvious fractures, lucencies, dislocations, or acute abnormalities.    MEDICAL DECISION MAKING (level of service defined by 2/3 elements)     Number/Complexity of Problems Addressed 1 acute, uncomplicated illness or injury (99203/99213)   Amount/Complexity of Data to be Reviewed/Analyzed Independent interpretation of a test performed by another physician/other qualified health care professional (99204/99214)   Risk of Complications/Morbidity/Mortality of Management Over-the-counter Medications (99203/99213)   DME ORDER:  Dx:  ,                   cc:  [name], MD  *Patient note was created using Dragon Dictation sotware. Errors in syntax or grammar may not have been identified and edited on initial review.

## 2021-09-25 NOTE — Unmapped (Signed)
[facility] Clinical Assessment & Refill Coordination Note    David Ellis, DOB: 1946/05/09  Phone: [phone] (home)     All above HIPAA information was verified with patient.     Was a Nurse, learning disability used for this call? No    Specialty Medication(s):   Transplant: Myfortic 180mg  and tacrolimus 1mg      Current Outpatient Medications   Medication Sig Dispense Refill   ??? acetaminophen (TYLENOL) 325 MG tablet Take by mouth.     ??? aspirin (ECOTRIN) 81 MG tablet Take 1 tablet (81 mg total) by mouth daily as needed (when flying).     ??? blood-glucose meter kit Use as instructed. 1 each 0   ??? cholecalciferol, vitamin D3-50 mcg, 2,000 unit,, 50 mcg (2,000 unit) tablet Take 1 tablet (50 mcg total) by mouth daily.     ??? flash glucose sensor (FREESTYLE LIBRE 14 DAY SENSOR) kit by Other route every fourteen (14) days. 2 each 11   ??? furosemide (LASIX) 40 MG tablet Take 1-2 tablets daily as needed for edema. (Patient taking differently: 1 tablet (40 mg total). 40 mg daily. And 1 additional tablet for edema.) 180 tablet 3   ??? gabapentin (NEURONTIN) 300 MG capsule TAKE 1 CAPSULE BY MOUTH TWICE DAILY AS DIRECTED (Patient taking differently: Take 1 capsule (300 mg total) by mouth two (2) times a day.) 180 capsule 3   ??? HUMULIN R U-500, CONC, KWIKPEN 500 unit/mL (3 mL) CONCENTRATED injection DIAL TO MAX 100 UNITS BEFORE BREAKFAST, LUNCH AND DINNER. INJECT 30 MINUTES BEFORE MEALS. MAX OF 300 UNITS DAILY. (Patient not taking: Reported on 09/25/2021) 18 mL 11   ??? insulin ASPART (NOVOLOG FLEXPEN U-100 INSULIN) 100 unit/mL (3 mL) injection pen 15-25 units with meals + correction. Max 100units daily. 30 mL 12   ??? insulin degludec 200 unit/mL (3 mL) InPn Inject 0.4 mL (80 Units total) under the skin daily. Titrate up to 100units daily as instructed. 30 mL 12   ??? insulin lispro (HUMALOG KWIKPEN INSULIN) 100 unit/mL injection pen 15-25 units with meals + correction. Max 100units daily. 15 mL 3   ??? losartan (COZAAR) 25 MG tablet Take 1 tablet (25 mg total) by mouth daily. 90 tablet 3   ??? magnesium chloride (SLOW_MAG) 64 mg TbEC Take 128 mg by mouth three (3) times a day (at 6am, noon and 6pm). (2 tablets) 9am 1 pm and 9 pm     ??? metoprolol succinate (TOPROL-XL) 100 MG 24 hr tablet TAKE ONE TABLET TWICE DAILY 180 tablet 1   ??? miscellaneous medical supply Misc 1 application by Miscellaneous route daily as needed. Lightweight wheelchair. 1 each 0   ??? miscellaneous medical supply Misc 1 application by Miscellaneous route nightly. Recommend CPAP 14 cm H2O with EPR 3 and heated humidifier for nasal dryness, mask: ResMed Activa Lt nasal mask (Large). 1 each 0   ??? multivitamin capsule Take 1 capsule by mouth daily.     ??? MYFORTIC 180 mg EC tablet Take 3 tablets (540 mg total) by mouth two (2) times a day. 540 tablet 3   ??? oxyCODONE (ROXICODONE) 5 MG immediate release tablet Take 1 tablet (5 mg total) by mouth two (2) times a day as needed for pain. 14 tablet 0   ??? pen needle, diabetic (BD ULTRA-FINE NANO PEN NEEDLE) 32 gauge x 5/32 (4 mm) Ndle 1 each by Other route Three (3) times a day before meals. 300 each 3   ??? pravastatin (PRAVACHOL)  20 MG tablet TAKE ONE TABLET BY MOUTH EVERY DAY 90 tablet 3   ??? tacrolimus (PROGRAF) 1 MG capsule Take 3 capsules (3 mg total) by mouth in the morning AND 2 capsules (2 mg total) in the evening. 450 capsule 3   ??? TRULICITY 1.5 mg/0.5 mL PnIj INJECT 1.5MG  (0.5ML) SUBCUTANEOUSLY ONCEA WEEK 2 mL 11   ??? warfarin (JANTOVEN) 2.5 MG tablet TAKE 1 TABLET BY MOUTH ONCE DAILY 30 tablet 11   ??? warfarin (JANTOVEN) 5 MG tablet Take 1 tablet (5 mg total) by mouth daily with evening meal. TAKE 6 DAYS A WEEK - Tuesday through Sunday 90 tablet 3     No current facility-administered medications for this visit.     Facility-Administered Medications Ordered in Other Visits   Medication Dose Route Frequency Provider Last Rate Last Admin   ??? bevacizumab    PRN (once a day) Melvyn Novas, MD   2.5 mg at 01/28/17 0750        Changes to medications: humulin changed to novolog    Allergies   Allergen Reactions   ??? Egg Derived Rash   ??? Enalapril Cough   ??? Epinephrine Palpitations   ??? Grass Pollen-[place], Standard Itching   ??? Levofloxacin Other (See Comments)     Per patient   ??? Lisinopril Rash   ??? Mepivacaine Hcl Palpitations   ??? Penicillins Other (See Comments)     As child  Tolerated amp/sulbactam without side effects during 10/2014 hospitalization       Changes to allergies: No    SPECIALTY MEDICATION ADHERENCE     Tacrolimus 1mg   : 12 days of medicine on hand   Myfortic 180mg  : 12 days of medicine on hand       Medication Adherence    Patient reported X missed doses in the last month: 0  Specialty Medication: myfortic 180mg   Patient is on additional specialty medications: Yes  Additional Specialty Medications: Tacrolimus 1mg   Patient Reported Additional Medication X Missed Doses in the Last Month: 0  Adherence tools used: patient uses a pill box to manage medications  Support network for adherence: family member          Specialty medication(s) dose(s) confirmed: Regimen is correct and unchanged.     Are there any concerns with adherence? No    Adherence counseling provided? Not needed    CLINICAL MANAGEMENT AND INTERVENTION      Clinical Benefit Assessment:    Do you feel the medicine is effective or helping your condition? Yes    Clinical Benefit counseling provided? Not needed    Adverse Effects Assessment:    Are you experiencing any side effects? No    Are you experiencing difficulty administering your medicine? No    Quality of Life Assessment:         How many days over the past month did your transplant  keep you from your normal activities? For example, brushing your teeth or getting up in the morning. 0    Have you discussed this with your provider? Not needed    Acute Infection Status:    Acute infections noted within Epic:  MDR Acinetobacter, MRSA  Patient reported infection: None    Therapy Appropriateness:    Is therapy appropriate and patient progressing towards therapeutic goals? Yes, therapy is appropriate and should be continued    DISEASE/MEDICATION-SPECIFIC INFORMATION      N/A    PATIENT SPECIFIC NEEDS     - Does the  patient have any physical, cognitive, or cultural barriers? No    - Is the patient high risk? Yes, patient is taking a REMS drug. Medication is dispensed in compliance with REMS program    - Does the patient require a Care Management Plan? No           SHIPPING     Specialty Medication(s) to be Shipped:   Transplant: Myfortic 180mg  and tacrolimus 1mg     Other medication(s) to be shipped: No additional medications requested for fill at this time     Changes to insurance: No    Delivery Scheduled: Yes, Expected medication delivery date: 10/03/2021.     Medication will be delivered via Next Day Courier to the confirmed prescription address in [facility].    The patient will receive a drug information handout for each medication shipped and additional FDA Medication Guides as required.  Verified that patient has previously received a Conservation officer, historic buildings and a Surveyor, mining.    The patient or caregiver noted above participated in the development of this care plan and knows that they can request review of or adjustments to the care plan at any time.      All of the patient's questions and concerns have been addressed.    [name]   [facility] Specialty Pharmacist

## 2021-09-29 MED ORDER — INSULIN DEGLUDEC (U-200) 200 UNIT/ML (3 ML) SUBCUTANEOUS PEN
Freq: Every day | SUBCUTANEOUS | 3 refills | 112 days | Status: CP
Start: 2021-09-29 — End: ?

## 2021-09-29 NOTE — Unmapped (Signed)
I just spoke w pts wife via phone.   They have changed from u500 to tresiba/humalog and doing OK other than:    The amount of tresiba they were given to provide 80units daily at [facility] is only going to last 3 weeks, then will have to pay extra copay.  Can someone please investigate and help me update script so that get appropriate 30 or 90 day supply from [facility]?     Otherwise, they do not feel they need another visit prior to returning to see me as scheduled May.   Thx!

## 2021-09-29 NOTE — Unmapped (Addendum)
Called patient's pharmacy. Determined that Evaristo Bury was filled on 4/4 for an 18-day supply (1 box = 9 ML) at $35. [facility] pharmacist Verlon Au) stated that patient's wife requested a 38-month supply of Tresiba due to recent transition from U-500 to basal/bolus regimen and higher copay of $70 for 2 boxes = 18 ML, which is a only 36-day supply.     CMS mandate effective 06/04/2021 caps monthly insulin copay at $35 for Medicare beneficiaries. However, if day supply exceeds 30 days, copay is rounded up to the next month at [facility]. Pharmacist was able to adjudicate 90-day supply of Tresiba U-200 (5 boxes = 45 ML) which = $105.    Called patient and spoke with his wife. Shared the above information. Advised her that it may be best to fill a 90-day supply of Tresiba U-200 at [facility] or they have the option of changing to [facility], which breaks insulin pen boxes and rounds down to the nearest pen. States she would prefer to continue filling at [facility] to CPP sent in new prescription for a 90-day supply to [facility].    She inquires regarding cost of Trulicity and whether the same $35 cap applies. States that patient is now paying >$200 for Trulicity (copay was previously in the $40s - 60s range), but appears to be in the Medicare coverage gap. States they have not qualified for [employer] via [employer] in the past due to income that exceeds annual household threshold. Advised patient that unfortunately, no other medication assistance exists for Trulicity for Medicare beneficiaries at this time. She verbalized understanding. They agree to continue paying for Trulicity if needed for diabetes regimen. She had no additional questions or concerns.    Message sent to Dr. Leretha Pol MD and Carmelina Peal PharmD for their awareness.     Saddie Benders?? PharmD, CPP, BCACP, CDCES, completed 09/29/21 17:01

## 2021-09-30 NOTE — Unmapped (Signed)
Addended by: Theodosia Paling on: 09/29/2021 05:03 PM     Modules accepted: Orders

## 2021-09-30 NOTE — Unmapped (Signed)
So very appreciated.

## 2021-10-02 MED FILL — TACROLIMUS 1 MG CAPSULE, IMMEDIATE-RELEASE: ORAL | 30 days supply | Qty: 150 | Fill #5

## 2021-10-04 DIAGNOSIS — I4891 Unspecified atrial fibrillation: Principal | ICD-10-CM

## 2021-10-04 MED ORDER — HUMALOG KWIKPEN (U-100) INSULIN 100 UNIT/ML SUBCUTANEOUS
3 refills | 0 days | Status: CP
Start: 2021-10-04 — End: ?

## 2021-10-04 MED ORDER — METOPROLOL SUCCINATE ER 100 MG TABLET,EXTENDED RELEASE 24 HR
ORAL_TABLET | 1 refills | 0 days | Status: CP
Start: 2021-10-04 — End: ?

## 2021-10-04 MED FILL — MYFORTIC 180 MG TABLET,DELAYED RELEASE: ORAL | 30 days supply | Qty: 180 | Fill #10

## 2021-10-04 NOTE — Unmapped (Signed)
Patient is requesting the following refill  Requested Prescriptions     Signed Prescriptions Disp Refills    metoprolol succinate (TOPROL-XL) 100 MG 24 hr tablet 180 tablet 1     Sig: TAKE ONE TABLET TWICE DAILY     Authorizing Provider: Yehuda Budd     Ordering User: Rise Patience       Recent Visits  Date Type Provider Dept   07/18/21 Office Visit Sharlee Blew, MD [facility] Internal Medicine [facility] [place]   06/15/21 Office Visit Yehuda Budd, MD [facility] Internal Medicine Cardiology   01/19/21 Office Visit Sharlee Blew, MD [facility] Internal Medicine [facility] [place]   12/08/20 Office Visit Yehuda Budd, MD [facility] Internal Medicine Cardiology   Showing recent visits within past 365 days with a meds authorizing provider and meeting all other requirements  Future Appointments  Date Type Provider Dept   01/18/22 Appointment Yehuda Budd, MD [facility] Internal Medicine Cardiology   Showing future appointments within next 365 days with a meds authorizing provider and meeting all other requirements

## 2021-10-16 ENCOUNTER — Ambulatory Visit (INDEPENDENT_AMBULATORY_CARE_PROVIDER_SITE_OTHER): Payer: Medicare Other | Admitting: Dermatology

## 2021-10-16 DIAGNOSIS — L578 Other skin changes due to chronic exposure to nonionizing radiation: Secondary | ICD-10-CM

## 2021-10-16 DIAGNOSIS — Z85828 Personal history of other malignant neoplasm of skin: Secondary | ICD-10-CM | POA: Diagnosis not present

## 2021-10-16 DIAGNOSIS — Z86007 Personal history of in-situ neoplasm of skin: Secondary | ICD-10-CM

## 2021-10-16 DIAGNOSIS — L570 Actinic keratosis: Secondary | ICD-10-CM | POA: Diagnosis not present

## 2021-10-16 DIAGNOSIS — L603 Nail dystrophy: Secondary | ICD-10-CM | POA: Diagnosis not present

## 2021-10-16 DIAGNOSIS — Z1283 Encounter for screening for malignant neoplasm of skin: Secondary | ICD-10-CM

## 2021-10-16 DIAGNOSIS — L821 Other seborrheic keratosis: Secondary | ICD-10-CM

## 2021-10-16 DIAGNOSIS — L853 Xerosis cutis: Secondary | ICD-10-CM

## 2021-10-16 DIAGNOSIS — L814 Other melanin hyperpigmentation: Secondary | ICD-10-CM

## 2021-10-16 NOTE — Progress Notes (Signed)
? ?Follow-Up Visit ?  ?Subjective  ?Larry Bright is a 76 y.o. male who presents for the following: Follow-up. ? ?The patient presents for Upper Body Skin Exam (UBSE) for skin cancer screening and mole check.  The patient has spots, moles and lesions to be evaluated, some may be new or changing. He has a history of BCC, SCC, and AKs.  ? ?The following portions of the chart were reviewed this encounter and updated as appropriate:  ?  ?  ? ?Review of Systems:  No other skin or systemic complaints except as noted in HPI or Assessment and Plan. ? ?Objective  ?Well appearing patient in no apparent distress; mood and affect are within normal limits. ? ?A focused examination was performed including all skin waist up and lower legs. Relevant physical exam findings are noted in the Assessment and Plan. ? ?Left Forearm, Left Earlobe, Left Antihelix ?Well healed scar with no evidence of recurrence.  ? 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 ?R nasal dorsum x 2, L nasal ala x 1, L lat eye x 1, mid forehead x 2, R mid cheek x 1 (7) ?Keratotic macules/papules ? ? ? ?Assessment & Plan  ?Skin cancer screening performed today. ? ?Actinic Damage ?- chronic, secondary to cumulative UV radiation exposure/sun exposure over time ?- diffuse scaly erythematous macules with underlying dyspigmentation ?- Recommend daily broad spectrum sunscreen SPF 30+ to sun-exposed areas, reapply every 2 hours as needed.  ?- Recommend staying in the shade or wearing long sleeves, sun glasses (UVA+UVB protection) and wide brim hats (4-inch brim around the entire circumference of the hat). ?- Call for new or changing lesions. ? ?Lentigines ?- Scattered tan macules ?- Due to sun exposure ?- Benign-appering, observe ?- Recommend daily broad spectrum sunscreen SPF 30+ to sun-exposed areas, reapply every 2 hours as needed. ?- Call for any changes ? ?Seborrheic Keratoses ?- Stuck-on, waxy, tan-brown papules  and/or plaques  ?- Benign-appearing ?- Discussed benign etiology and prognosis. ?- Observe ?- Call for any changes ? ?Xerosis ?- diffuse xerotic patches ?- recommend gentle, hydrating skin care ?- gentle skin care handout given. Rec. CeraVe SA cream ? ?History of Basal Cell Carcinoma of the Skin ?- No evidence of recurrence today of the left med cheek, right nasal dorsum ?- Recommend regular full body skin exams ?- Recommend daily broad spectrum sunscreen SPF 30+ to sun-exposed areas, reapply every 2 hours as needed.  ?- Call if any new or changing lesions are noted between office visits ? ?History of squamous cell carcinoma in situ (SCCIS) of skin ?Left Forearm, Left Earlobe, Left Antihelix ? ?Clear. Observe for recurrence. Call clinic for new or changing lesions.  Recommend regular skin exams, daily broad-spectrum spf 30+ sunscreen use, and photoprotection.   ? ?Nail dystrophy ?L hand 4th and 5th fingernails ? ?H/o trauma in past ? ?Benign, observe. ? ?Hypertrophic actinic keratosis (7) ?R nasal dorsum x 2, L nasal ala x 1, L lat eye x 1, mid forehead x 2, R mid cheek x 1 ? ?vs Inflamed Sks. Recheck on follow-up. ? ?Actinic keratoses are precancerous spots that appear secondary to cumulative UV radiation exposure/sun exposure over time. They are chronic with expected duration over 1 year. A portion of actinic keratoses will progress to squamous cell carcinoma of the skin. It is not possible to reliably predict which spots will progress to skin cancer and so treatment is recommended to prevent development of skin  cancer. ? ?Recommend daily broad spectrum sunscreen SPF 30+ to sun-exposed areas, reapply every 2 hours as needed.  ?Recommend staying in the shade or wearing long sleeves, sun glasses (UVA+UVB protection) and wide brim hats (4-inch brim around the entire circumference of the hat). ?Call for new or changing lesions. ? ?Destruction of lesion - R nasal dorsum x 2, L nasal ala x 1, L lat eye x 1, mid forehead  x 2, R mid cheek x 1 ? ?Destruction method: cryotherapy   ?Informed consent: discussed and consent obtained   ?Lesion destroyed using liquid nitrogen: Yes   ?Region frozen until ice ball extended beyond lesion: Yes   ?Outcome: patient tolerated procedure well with no complications   ?Post-procedure details: wound care instructions given   ?Additional details:  Prior to procedure, discussed risks of blister formation, small wound, skin dyspigmentation, or rare scar following cryotherapy. Recommend Vaseline ointment to treated areas while healing. ? ? ? ?Return in about 6 months (around 04/18/2022) for UBSE, Hx SCC, Hx BCC, Hx AKs. ? ?I, Jamesetta Orleans, CMA, am acting as scribe for Brendolyn Patty, MD . ? ?Documentation: I have reviewed the above documentation for accuracy and completeness, and I agree with the above. ? ?Brendolyn Patty MD  ? ?

## 2021-10-16 NOTE — Patient Instructions (Addendum)
Cryotherapy Aftercare ? ?Wash gently with soap and water everyday.   ?Apply Vaseline and Band-Aid daily until healed.  ? ?Recommend starting moisturizer with exfoliant (Urea, Salicylic acid, or Lactic acid) one to two times daily to help smooth rough and bumpy skin.  CeraVe SA lotion/cream for Rough and Bumpy skin (Sal Acid).   If applying in morning, also apply sunscreen to sun-exposed areas, since these exfoliating moisturizers can increase sensitivity to sun. ? ? ?Gentle Skin Care Guide ? ?1. Bathe no more than once a day. ? ?2. Avoid bathing in hot water ? ?3. Use a mild soap like Dove, Vanicream, Cetaphil, CeraVe. Can use Lever 2000 or Cetaphil antibacterial soap ? ?4. Use soap only where you need it. On most days, use it under your arms, between your legs, and on your feet. Let the water rinse other areas unless visibly dirty. ? ?5. When you get out of the bath/shower, use a towel to gently blot your skin dry, don't rub it. ? ?6. While your skin is still a little damp, apply a moisturizing cream such as Vanicream, CeraVe, Cetaphil, Eucerin, Sarna lotion or plain Vaseline Jelly. For hands apply Neutrogena [place] Hand Cream or Excipial Hand Cream. ? ?7. Reapply moisturizer any time you start to itch or feel dry. ? ?8. Sometimes using free and clear laundry detergents can be helpful. Fabric softener sheets should be avoided. Downy Free & Gentle liquid, or any liquid fabric softener that is free of dyes and perfumes, it acceptable to use ? ?9. If your doctor has given you prescription creams you may apply moisturizers over them  ? ? ? ? ?If You Need Anything After Your Visit ? ?If you have any questions or concerns for your doctor, please call our main line at [phone] and press option 4 to reach your doctor's medical assistant. If no one answers, please leave a voicemail as directed and we will return your call as soon as possible. Messages left after 4 pm will be answered the following business day.   ? ?You may also send [place] a message via MyChart. We typically respond to MyChart messages within 1-2 business days. ? ?For prescription refills, please ask your pharmacy to contact our office. Our fax number is [phone]. ? ?If you have an urgent issue when the clinic is closed that cannot wait until the next business day, you can page your doctor at the number below.   ? ?Please note that while we do our best to be available for urgent issues outside of office hours, we are not available 24/7.  ? ?If you have an urgent issue and are unable to reach [place], you may choose to seek medical care at your doctor's office, retail clinic, urgent care center, or emergency room. ? ?If you have a medical emergency, please immediately call 911 or go to the emergency department. ? ?Pager Numbers ? ?- Dr. Nehemiah Massed: [phone] ? ?- Dr. Laurence Ferrari: [phone] ? ?- Dr. Nicole Kindred: [phone] ? ?In the event of inclement weather, please call our main line at [phone] for an update on the status of any delays or closures. ? ?Dermatology Medication Tips: ?Please keep the boxes that topical medications come in in order to help keep track of the instructions about where and how to use these. Pharmacies typically print the medication instructions only on the boxes and not directly on the medication tubes.  ? ?If your medication is too expensive, please contact our office at [phone] option 4 or send [place]  a message through [place].  ? ?We are unable to tell what your co-pay for medications will be in advance as this is different depending on your insurance coverage. However, we may be able to find a substitute medication at lower cost or fill out paperwork to get insurance to cover a needed medication.  ? ?If a prior authorization is required to get your medication covered by your insurance company, please allow [place] 1-2 business days to complete this process. ? ?Drug prices often vary depending on where the prescription is filled and some  pharmacies may offer cheaper prices. ? ?The website www.goodrx.com contains coupons for medications through different pharmacies. The prices here do not account for what the cost may be with help from insurance (it may be cheaper with your insurance), but the website can give you the price if you did not use any insurance.  ?- You can print the associated coupon and take it with your prescription to the pharmacy.  ?- You may also stop by our office during regular business hours and pick up a GoodRx coupon card.  ?- If you need your prescription sent electronically to a different pharmacy, notify our office through [facility] or by phone at [phone] option 4. ? ? ? ? ?Si Usted Necesita Algo Despu?s de Su Visita ? ?Tambi?n puede enviarnos un mensaje a trav?s de MyChart. Por lo general respondemos a los mensajes de MyChart en el transcurso de 1 a 2 d?as h?biles. ? ?Para renovar recetas, por favor pida a su farmacia que se ponga en contacto con nuestra oficina. Nuestro n?mero de fax es el [phone]. ? ?Si tiene un asunto urgente cuando la cl?nica est? cerrada y que no puede esperar hasta el siguiente d?a h?bil, puede llamar/localizar a su doctor(a) al n?mero que aparece a continuaci?n.  ? ?Por favor, tenga en cuenta que aunque hacemos todo lo posible para estar disponibles para asuntos urgentes fuera del horario de oficina, no estamos disponibles las 24 horas del d?a, los 7 d?as de la semana.  ? ?Si tiene un problema urgente y no puede comunicarse con nosotros, puede optar por buscar atenci?n m?dica  en el consultorio de su doctor(a), en una cl?nica privada, en un centro de atenci?n urgente o en una sala de emergencias. ? ?Si tiene Engineer, maintenance (IT) m?dica, por favor llame inmediatamente al 911 o vaya a la sala de emergencias. ? ?N?meros de b?per ? ?- Dr. Nehemiah Massed: [phone] ? ?- Dra. Moye: [phone] ? ?- Dra. Nicole Kindred: [phone] ? ?En caso de inclemencias del tiempo, por favor llame a nuestra l?nea  principal al [phone] para una actualizaci?n sobre el estado de cualquier retraso o cierre. ? ?Consejos para la medicaci?n en dermatolog?a: ?Por favor, guarde las cajas en las que vienen los medicamentos de uso t?pico para ayudarle a seguir las instrucciones sobre d?nde y c?mo usarlos. Las farmacias generalmente imprimen las instrucciones del medicamento s?lo en las cajas y no directamente en los tubos del [place].  ? ?Si su medicamento es muy caro, por favor, p?ngase en contacto con Zigmund Daniel llamando al [phone] y presione la opci?n 4 o env?enos un mensaje a trav?s de MyChart.  ? ?No podemos decirle cu?l ser? su copago por los medicamentos por adelantado ya que esto es diferente dependiendo de la cobertura de su seguro. Sin embargo, es posible que podamos encontrar un medicamento sustituto a Electrical engineer un formulario para que el seguro cubra el medicamento que se considera necesario.  ? ?Si se requiere  una autorizaci?n previa para que su compa??a de seguros [place] su medicamento, por favor perm?tanos de 1 a 2 d?as h?biles para completar este proceso. ? ?Los precios de los medicamentos var?an con frecuencia dependiendo del Environmental consultant de d?nde se surte la receta y alguna farmacias pueden ofrecer precios m?s baratos. ? ?El sitio web www.goodrx.com tiene cupones para medicamentos de Airline pilot. Los precios aqu? no tienen en cuenta lo que podr?a costar con la ayuda del seguro (puede ser m?s barato con su seguro), pero el sitio web puede darle el precio si no utiliz? ning?n seguro.  ?- Puede imprimir el cup?n correspondiente y llevarlo con su receta a la farmacia.  ?- Tambi?n puede pasar por nuestra oficina durante el horario de atenci?n regular y recoger una tarjeta de cupones de GoodRx.  ?- Si necesita que su receta se env?e electr?nicamente a Chiropodist, informe a nuestra oficina a trav?s de MyChart de [place] o por tel?fono llamando al [phone] y presione la opci?n  4. ? ?

## 2021-10-17 NOTE — Unmapped (Signed)
Patient's spouse called, asked for a colonoscopy referral to be sent to [facility] clinic so they could get the appointment set up. Please advise.

## 2021-10-18 DIAGNOSIS — Z0000 Encounter for general adult medical examination without abnormal findings: Principal | ICD-10-CM

## 2021-10-18 NOTE — Unmapped (Signed)
Spoke to patient and patient's spouse regarding instructions for colonoscopy and appointment, he verbalized understanding and were appreciative of call.

## 2021-10-18 NOTE — Unmapped (Signed)
I have ordered colonoscopy  Please have him call for apointment.  [phone].    Once he gets appointment, please have him set up appointment with me 2-3 weeks prior so that we can manage the coumadin, the insulin,e tc.    [name], M.D.     [facility] Internal Medicine at [facility]   [address]  [address]  [place], [place]  [address]  [phone]

## 2021-10-19 NOTE — Unmapped (Signed)
Patient's spouse called, the referral for the patient's colonoscopy was received; however, they were told that the gastroenterologist needed to know the procedure for managing/stopping his warfarin before they made an appointment. This either needs to be faxed to their office or placed in MyChart. Once that is done they will be able to schedule the colonosopy.

## 2021-10-19 NOTE — Unmapped (Signed)
Colonoscopy  Procedure #1      0  Procedure #2      [id number]  MRN      GENERIC  Endoscopist      FALSE  Is the patient's health insurance [employer], [place] [facility]), or [employer]?      FALSE  Urgent procedure      TRUE  Do you take: Plavix (clopidogrel), Coumadin (warfarin), Lovenox (enoxaparin), Pradaxa (dabigatran), Effient (prasugrel), Xarelto (rivaroxaban), Eliquis (apixaban), Pletal (cilostazol), or Brilinta (ticagrelor)?    FALSE  Do you have hemophilia, von Willebrand disease, thrombocytopenia?      FALSE  Do you have a pacemaker or implanted cardiac defibrillator?      FALSE  Are you pregnant?      FALSE  Has a [place] GI provider specified the location(s)?        Which location(s) did the [facility] provider specify?      FALSE     [facility]      FALSE     [place]      FALSE     [facility]      FALSE     [facility]      FALSE  Is procedure indication for variceal banding (this does NOT include variceal screening)?              6  Height (feet)      0  Height (inches)      245  Weight (pounds)      33.2  BMI              FALSE  Did the ordering provider specify a bowel prep?      0       What bowel prep was specified?      FALSE  Do you have chronic kidney disease?      FALSE  Do you have chronic constipation or have you had poor quality bowel preps for past colonoscopies?      FALSE  Do you have Crohn's disease or ulcerative colitis?      FALSE  Have you had weight loss surgery?              FALSE  Are you in the process of scheduling or awaiting results of a heart ultrasound, stress test, or catheterization to evaluate new or worsening chest pain, dizziness, or shortness of breath?     FALSE  When you walk around your house or grocery store, do you have to stop and rest due to shortness of breath, chest pain, or light-headedness?      FALSE  Have you had a heart attack, stroke or heart stent placement within the past 6 months?      FALSE  Do you ever use supplemental oxygen?      FALSE  Have you been hospitalized for cirrhosis of the liver or heart failure in the last 12 months?      FALSE  Have you been treated for mouth or throat cancer with radiation or surgery?      FALSE  Have you been told that it is difficult for doctors to insert a breathing tube in you during anesthesia?      FALSE  Have you had a heart or lung transplant?              FALSE  Are you on dialysis?      FALSE  Do you have cirrhosis of the liver?  FALSE  Do you have myasthenia gravis?      FALSE  Is the patient a prisoner?              TRUE  Have you been diagnosed with sleep apnea or do you wear a CPAP machine at night?      FALSE  Are you younger than 30?      FALSE  Have you previously received propofol sedation administered by an anesthesiologist for a GI procedure?      FALSE  Do you drink an average of more than 3 drinks of alcohol per day?      FALSE  Do you regularly take suboxone or any prescription medications for chronic pain?      FALSE  Do you regularly take Ativan, Klonopin, Xanax, Valium, lorazepam, clonazepam, alprazolam, or diazepam?      FALSE  Have you previously had difficulty with sedation during a GI procedure?      FALSE  Have you been diagnosed with PTSD?      FALSE  Are you allergic to fentanyl or midazolam (Versed)?      FALSE  Do you take medications for HIV?      ################### ################################################################################################################### ################ ############### ###############   MRN:          [id number]      Anticoag Review:  Yes      Nurse Triage:  No      GI Clinic Consult:  No      Procedure(s):  Colonoscopy 0     [facility]  [facility]  [place]      Endoscopist:  GENERIC      Urgent:            No       Prep:               Nulytely              ################### ################################################################################################################### ################ ############### ###############

## 2021-10-20 NOTE — Unmapped (Signed)
They typically reach out to me through Epic.    Does she know the person whom I should speak with?  Ill send them a message or can write something in the note.    [name], M.D.     [facility] Internal Medicine at [facility]   [address]  [address]  [place], [place]  [address]  [phone]

## 2021-10-24 ENCOUNTER — Ambulatory Visit: Admit: 2021-10-24 | Discharge: 2021-10-24 | Payer: MEDICARE

## 2021-10-24 ENCOUNTER — Ambulatory Visit: Admit: 2021-10-24 | Discharge: 2021-10-24 | Payer: MEDICARE | Attending: "Endocrinology | Primary: "Endocrinology

## 2021-10-24 DIAGNOSIS — T383X5A Adverse effect of insulin and oral hypoglycemic [antidiabetic] drugs, initial encounter: Principal | ICD-10-CM

## 2021-10-24 DIAGNOSIS — I4891 Unspecified atrial fibrillation: Principal | ICD-10-CM

## 2021-10-24 DIAGNOSIS — I10 Essential (primary) hypertension: Principal | ICD-10-CM

## 2021-10-24 DIAGNOSIS — Z940 Kidney transplant status: Principal | ICD-10-CM

## 2021-10-24 DIAGNOSIS — Z794 Long term (current) use of insulin: Principal | ICD-10-CM

## 2021-10-24 DIAGNOSIS — E1129 Type 2 diabetes mellitus with other diabetic kidney complication: Principal | ICD-10-CM

## 2021-10-24 DIAGNOSIS — E160 Drug-induced hypoglycemia without coma: Principal | ICD-10-CM

## 2021-10-24 DIAGNOSIS — Z1159 Encounter for screening for other viral diseases: Principal | ICD-10-CM

## 2021-10-24 DIAGNOSIS — R944 Abnormal results of kidney function studies: Principal | ICD-10-CM

## 2021-10-24 DIAGNOSIS — Z79899 Other long term (current) drug therapy: Principal | ICD-10-CM

## 2021-10-24 DIAGNOSIS — Z949 Transplanted organ and tissue status, unspecified: Principal | ICD-10-CM

## 2021-10-24 DIAGNOSIS — Z0000 Encounter for general adult medical examination without abnormal findings: Principal | ICD-10-CM

## 2021-10-24 LAB — COMPREHENSIVE METABOLIC PANEL
ALBUMIN: 3.7 g/dL (ref 3.4–5.0)
ALKALINE PHOSPHATASE: 124 U/L — ABNORMAL HIGH (ref 46–116)
ALT (SGPT): 19 U/L (ref 10–49)
ANION GAP: 4 mmol/L — ABNORMAL LOW (ref 5–14)
AST (SGOT): 23 U/L (ref <=34)
BILIRUBIN TOTAL: 0.6 mg/dL (ref 0.3–1.2)
BLOOD UREA NITROGEN: 23 mg/dL (ref 9–23)
BUN / CREAT RATIO: 19
CALCIUM: 9.6 mg/dL (ref 8.7–10.4)
CHLORIDE: 107 mmol/L (ref 98–107)
CO2: 31.3 mmol/L — ABNORMAL HIGH (ref 20.0–31.0)
CREATININE: 1.23 mg/dL — ABNORMAL HIGH
EGFR CKD-EPI (2021) MALE: 62 mL/min/{1.73_m2} (ref >=60)
GLUCOSE RANDOM: 109 mg/dL — ABNORMAL HIGH (ref 70–99)
POTASSIUM: 5.8 mmol/L — ABNORMAL HIGH (ref 3.4–4.8)
PROTEIN TOTAL: 6.7 g/dL (ref 5.7–8.2)
SODIUM: 142 mmol/L (ref 135–145)

## 2021-10-24 LAB — PROTIME-INR
INR: 1.73
PROTIME: 20 s — ABNORMAL HIGH (ref 9.8–12.8)

## 2021-10-24 LAB — PHOSPHORUS: PHOSPHORUS: 3.3 mg/dL (ref 2.4–5.1)

## 2021-10-24 LAB — CBC W/ AUTO DIFF
BASOPHILS ABSOLUTE COUNT: 0 10*9/L (ref 0.0–0.1)
BASOPHILS RELATIVE PERCENT: 0.3 %
EOSINOPHILS ABSOLUTE COUNT: 0.1 10*9/L (ref 0.0–0.5)
EOSINOPHILS RELATIVE PERCENT: 0.8 %
HEMATOCRIT: 45.6 % (ref 39.0–48.0)
HEMOGLOBIN: 15.1 g/dL (ref 12.9–16.5)
LYMPHOCYTES ABSOLUTE COUNT: 1.1 10*9/L (ref 1.1–3.6)
LYMPHOCYTES RELATIVE PERCENT: 11 %
MEAN CORPUSCULAR HEMOGLOBIN CONC: 33.2 g/dL (ref 32.0–36.0)
MEAN CORPUSCULAR HEMOGLOBIN: 28.3 pg (ref 25.9–32.4)
MEAN CORPUSCULAR VOLUME: 85.3 fL (ref 77.6–95.7)
MEAN PLATELET VOLUME: 7.6 fL (ref 6.8–10.7)
MONOCYTES ABSOLUTE COUNT: 0.6 10*9/L (ref 0.3–0.8)
MONOCYTES RELATIVE PERCENT: 6.5 %
NEUTROPHILS ABSOLUTE COUNT: 7.8 10*9/L (ref 1.8–7.8)
NEUTROPHILS RELATIVE PERCENT: 81.4 %
NUCLEATED RED BLOOD CELLS: 0 /100{WBCs} (ref <=4)
PLATELET COUNT: 212 10*9/L (ref 150–450)
RED BLOOD CELL COUNT: 5.34 10*12/L (ref 4.26–5.60)
RED CELL DISTRIBUTION WIDTH: 13.7 % (ref 12.2–15.2)
WBC ADJUSTED: 9.6 10*9/L (ref 3.6–11.2)

## 2021-10-24 LAB — TACROLIMUS LEVEL: TACROLIMUS BLOOD: 4.2 ng/mL

## 2021-10-24 LAB — MAGNESIUM: MAGNESIUM: 2 mg/dL (ref 1.6–2.6)

## 2021-10-24 NOTE — Unmapped (Cosign Needed)
Freestyle libre Meter and dexcom uploaded to [employer]. POC Glucose done today. PP 9:30 am. 105 mg/dL.

## 2021-10-24 NOTE — Unmapped (Signed)
Commentary on anti coagulation for anticipated elective colonoscopy.    Last colonoscopy in 2013 and they recommended repeat in 2018.  THis hasn't been done due to complicated medical factors.    If things are stable (which they have been recently),   Would liek to pursue colonoscopy.    Due to his history of large pulmonary embolism, and hisstory of AFIB (CHADSVASC score 6), he is at very very high risk of complications of stroke or PE if we stop his coumadin.    I would recommend that when we stop his coumadin for his colonosocpy, we do lovenox bridge.    THis means   5 days prior to procedure, stop colonoscopy  3 days prior to procedure, start lovenox 110 mg subcutaneous twice a day   The morning of colonoscopy DO NOT TAKE LOVENOX or coumadin    Undergo colonoscopy.  If the endoscopist does NOT feel large bleeding risk    The night of the colonoscopy restart coumadin as prior dosing  THe morning after colonoscopy start lovenox 110 mg subcutaneous twice a day until INR hits 2.0  Check PT/INR 4 days after restarting coumadin.   When INR hits 2.0 or greater, stop lovenox.      [name], M.D.     [facility] Internal Medicine at [facility]   [address]  [address]  [place], [place]  [address]  [phone]

## 2021-10-24 NOTE — Unmapped (Addendum)
Goal fasting glucoses 100-140 range.  Goal after eating < 200.     Reduce Tresiba to 70units to avoid waking with glucoses < 100.    If you start consistently waking with glucoses > 140, you can gradually increase by 2 units every 3 days.     Humalog (short acting insulin starts working in , peaks about one hour, then lasts up to 4 hours).   You can choose 10-20 units based on size of meal, carbohydrate content, and can also use direction of CGM arrows to help guide dosing.     Humalog can also be used as correction insulin even when you are NOT eating  Humalog (can be given every 4 hours as needed):  Glucose > 175 + 1unit  > 200 + 2 units  > 225 +3 units  > 250 +4 units  > 275 +5 units  > 300 +6 units    When you start restricting diet for colonoscopy, you should reduce your Tresiba dose that day to 50units.   Obviously would not give Humaog 10-20 because not eating, but you could use Humalog correctino dose every 4 hours as needed.   When colonoscopy ends, and returns to normal diet, just return to normal dose.

## 2021-10-24 NOTE — Unmapped (Signed)
PCP:  [name], MD      ASSESSMENT/PLAN: [name] is a 76 y.o. 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    ## S/P LURD kidney transplant 2013 with usual baseline creatinine ~1.6-2.0, but running lower more recently.  Has a history of a DQ 7 donor specific antibody noted on 09/17/2011 with all subsequent HLA antibody screens revealing no DSA's. Tac troughs running at goal 4-6. Continue Prograf 3 mg qAM, 2 mg qPM.     ## Hyperkalemia: Potassium 5.8 today 10/24/21.  Counseled to avoid high potassium foods. Will recheck at next visit which I think is fine as he had a bunch of strawberries prior to today's lab!     ## Stage A2G3a CKD:  Has persistent albuminuria so continue losartan 25 mg daily. Below his baseline creatinine on 04/07/21. Consider SGLT2i addition.    ## HTN: Well controlled - BP in clinic today 132/76. Continue metoprolol, losartan and lasix.    ## T2DM: HbA1c 8.4 on 09/20/21 currently on Trulicity and insulin. Discontinuing insulin and starting [place] and Novolog today as per endocrinology. History of proliferative retinopathy s/p PPV/endolaser treatments, last evaluated by ophthalmology in 01/2021.     ## ASHD/CVA history. Continue pravastatin and ASA.     ## A-Fib. Continue warfarin. INR f/b Dr. Dellis Filbert. Have previously discussed possibility of transitioning to DOAC, but he feels since he has to get blood drawn monthly anyways, he may as well stay on the warfarin.    ## HM: Had his covid vaccinations and is UTD on flu, pneumonia and shingrix. Colonoscopy being scheduled. Continue biannual follow-up with dermatology re: recurrent basal cell CA. Given high risk for severe COVID19 infection, he received Evusheld injections on 06/28/20, 08/26/20, 02/28/21.    ## High falls risk: Discussed importance of utilizing walker and cane whenever possible.    David Ellis will follow up with Dr. Margaretmary Bayley in August for his annual post-transplant evaluation and then will see me 3 months after that.     Deniece Rankin K. Brooklee Michelin, MD  Nephrology and Hypertension  [email or web address]  [phone]  __________________________________________________________________________    HPI: David Ellis is a 76 y.o. man s/p LURD kidney transplant (from his wife, David Ellis) 08/21/2011 and with a B/L creatinine ~1.4-1.7mg /dl. 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     He has had multiple complications since his transplant including a large, provoked (long car ride) pulmonary embolus requiring thrombolysis (Dec 2014), osteomyelitis of his left foot requiring 4th/5th toe amputation (2016), recurrent basal cell CA, CVA s/p thombolysis (07/2015). He has significant PAD and neuropathy and has no sensation on either leg below his knees. He also has severe retinopathy and his vision is quite poor so no longer drives.     Interval history:   Since last visit on 04/05/2021 David Ellis has followed with internal medicine, diabetes and endocrinology, and wound care. On 07/28/2020, he presented to ED after suffering a fall. On exam, he had a laceration on his forehead. XR shoulder and humerus revealed a right proximal humerus fracture. Laceration repaired with sutures, fracture with  sling. Discharged same day in Ellis condition on oxycodone as needed.    Today, David Ellis reports his arm is healing since his fall in February and he was able to get out of bed on his own for the first time today. He has trouble walking on stairs with his current pair of Limbionic shoes and is hoping to get something that fits better. Otherwise he is doing very well. No CP, SOB, cough, N/V/D, edema.    ROS: As per HPI. The remainder of the 10 system review is negative.    PAST MEDICAL HISTORY:  Past Medical History:   Diagnosis Date    Arthritis     Atrial flutter (CMS-HCC)     Cataract     Diabetes mellitus (CMS-HCC)     Diabetic nephropathy (CMS-HCC)     Diabetic retinopathy (CMS-HCC)     Fractures     Ganglion cyst     Hand injury     Heart disease     Hyperlipidemia     Hypertension     Impaired mobility     Joint pain     Lack of access to transportation     Osteomyelitis (CMS-HCC) 11/2014    Pulmonary embolism (CMS-HCC) 05/2014    Retinopathy due to secondary diabetes (CMS-HCC) 01/2013    Squamous cell skin cancer 11/2013    Stroke (CMS-HCC)     Tear of meniscus of knee     Transplanted kidney 08/21/2011       ALLERGIES  Egg derived; Enalapril; Epinephrine; Grass pollen-bermuda, standard; Levofloxacin; Lisinopril; Mepivacaine hcl; and Penicillins    MEDICATIONS:  Current Outpatient Medications   Medication Sig Dispense Refill    acetaminophen (TYLENOL) 325 MG tablet Take by mouth.      aspirin (ECOTRIN) 81 MG tablet Take 1 tablet (81 mg total) by mouth daily as needed (when flying).      blood-glucose meter kit Use as instructed. 1 each 0    cholecalciferol, vitamin D3-50 mcg, 2,000 unit,, 50 mcg (2,000 unit) tablet Take 1 tablet (50 mcg total) by mouth daily.      flash glucose sensor (FREESTYLE LIBRE 14 DAY SENSOR) kit by Other route every fourteen (14) days. 2 each 11    furosemide (LASIX) 40 MG tablet Take 1 tablet (40 mg total) by mouth daily. 40 mg daily. And 1 additional tablet for edema.      gabapentin (NEURONTIN) 300 MG capsule TAKE 1 CAPSULE BY MOUTH TWICE DAILY AS DIRECTED (Patient taking differently: Take 1 capsule (300 mg total) by mouth two (2) times a day.) 180 capsule 3    HUMALOG KWIKPEN INSULIN 100 unit/mL injection pen INJECT 15-25 UNITS WITH MEALS + CORRECTION **MAX 100 UNITS A DAY** 60 mL 3    insulin degludec 200 unit/mL (3 mL) InPn Inject 0.4 mL (80 Units total) under the skin daily. Titrate up to 100units daily as instructed. 45 mL 3    losartan (COZAAR) 25 MG tablet Take 1 tablet (25 mg total) by mouth daily. 90 tablet 3    magnesium chloride (SLOW_MAG) 64 mg TbEC Take 128 mg by mouth three (3) times a day (at 6am, noon and 6pm). (2 tablets) 9am 1 pm and 9 pm      metoprolol succinate (TOPROL-XL) 100 MG 24 hr tablet TAKE ONE TABLET TWICE DAILY 180 tablet 1    miscellaneous medical supply Misc 1 application by Miscellaneous route daily as needed. Lightweight wheelchair. 1 each 0    miscellaneous medical supply Misc  1 application by Miscellaneous route nightly. Recommend CPAP 14 cm H2O with EPR 3 and heated humidifier for nasal dryness, mask: ResMed Activa Lt nasal mask (Large). 1 each 0    multivitamin capsule Take 1 capsule by mouth daily.      MYFORTIC 180 mg EC tablet Take 3 tablets (540 mg total) by mouth two (2) times a day. 540 tablet 3    oxyCODONE (ROXICODONE) 5 MG immediate release tablet Take 1 tablet (5 mg total) by mouth two (2) times a day as needed for pain. 14 tablet 0    pen needle, diabetic (BD ULTRA-FINE NANO PEN NEEDLE) 32 gauge x 5/32 (4 mm) Ndle 1 each by Other route Three (3) times a day before meals. 300 each 3    pravastatin (PRAVACHOL) 20 MG tablet TAKE ONE TABLET BY MOUTH EVERY DAY 90 tablet 3    tacrolimus (PROGRAF) 1 MG capsule Take 3 capsules (3 mg total) by mouth in the morning AND 2 capsules (2 mg total) in the evening. 450 capsule 3    TRULICITY 1.5 mg/0.5 mL PnIj INJECT 1.5MG  (0.5ML) SUBCUTANEOUSLY ONCEA WEEK 2 mL 11    warfarin (JANTOVEN) 2.5 MG tablet TAKE 1 TABLET BY MOUTH ONCE DAILY 30 tablet 11    warfarin (JANTOVEN) 5 MG tablet Take 1 tablet (5 mg total) by mouth daily with evening meal. TAKE 6 DAYS A WEEK - Tuesday through Sunday 90 tablet 3     No current facility-administered medications for this visit.     Facility-Administered Medications Ordered in Other Visits Medication Dose Route Frequency Provider Last Rate Last Admin    bevacizumab    PRN (once a day) Melvyn Novas, MD   2.5 mg at 01/28/17 0750     PHYSICAL EXAM:  Vitals:    10/24/21 1134   BP: 132/76   Pulse: 82   Temp: 36.7 ??C (98 ??F)       CONSTITUTIONAL: Alert,well appearing, no distress  HEENT: Moist mucous membranes, oropharynx clear without erythema or exudate  NECK: Supple, no lymphadenopathy  CARDIOVASCULAR: Regular, normal S1/S2 heart sounds, no murmurs, no rubs.   PULM: Clear to auscultation bilaterally  GASTROINTESTINAL: Soft, active bowel sounds, nontender  EXTREMITIES: Trace B/L woody edema bilaterally  SKIN: No rashes     MEDICAL DECISION MAKING  Lab Results   Component Value Date    NA 142 10/24/2021    K 5.8 (H) 10/24/2021    CO2 31.3 (H) 10/24/2021    BUN 23 10/24/2021    CREATININE 1.23 (H) 10/24/2021    CALCIUM 9.6 10/24/2021    PHOS 3.3 10/24/2021     Lab Results   Component Value Date    WBC 9.6 10/24/2021    HGB 15.1 10/24/2021    PLT 212 10/24/2021     Lab Results   Component Value Date    TACROLIMUS 4.2 10/24/2021     Lab Results   Component Value Date    A1C 8.4 (H) 09/20/2021       Scribe's Attestation: Trey Sailors, MD obtained and performed the history, physical exam and medical decision making elements that were entered into the chart. Signed by Leim Fabry, Scribe, on Oct 24, 2021 at 1:18 PM.     Attending Statement: Documentation assistance provided by the Scribe. I was present during the time the encounter was recorded. The information recorded by the Scribe was done at my direction and has been reviewed and validated by me.

## 2021-10-24 NOTE — Unmapped (Signed)
ASSESSMENT / PLAN  1. 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)  2. Encounter for long-term (current) use of insulin (CMS-HCC)  3. Hypoglycemia due to insulin  Improved glucose control with estimated HgbA1c 7.8 on CGM with use of mdi of insulin (tresiba/humalog), trulicity, and help from CGM.   REVIEWED:  Goal fasting glucoses 100-140 range.  Goal after eating < 200.     Reduce Tresiba to 70units to avoid waking with glucoses < 100.    If you start consistently waking with glucoses > 140, you can gradually increase by 2 units every 3 days.     Humalog (short acting insulin starts working in , peaks about one hour, then lasts up to 4 hours).   You can choose 10-20 units based on size of meal, carbohydrate content, and can also use direction of CGM arrows to help guide dosing.     Humalog can also be used as correction insulin even when you are NOT eating  Humalog (can be given every 4 hours as needed):  Glucose > 175 + 1unit  > 200 + 2 units  > 225 +3 units  > 250 +4 units  > 275 +5 units  > 300 +6 units    When you start restricting diet for colonoscopy, you should reduce your Tresiba dose to 50units.   Obviously would not give Humaog 10-20 with meals when not eating, but you could use Humalog correction dose every 4 hours as needed for hyperglycemia.   When colonoscopy ends, and returns to normal diet, just return to normal dose.    Focused visit today on glucose control.     MEDICARE:  *The patient is currently using the therapeutic CGM as prescribed.  *The patient has diabetes.  *The patient is insulin-treated with 3 or more daily administrations of insulin (MDII)  prior to use of a CGM.   *The patient???s insulin treatment regimen requires frequent adjustments by the patient based on sliding scale.  *Within the last six months, I had an in-person visit with the beneficiary to evaluate their diabetes control and determine that the above criteria are met.   *Every six months I will continue to have an in-person visit with the beneficiary to assess adherence to their CGM regimen and diabetes treatment plan.      Return in about 6 months (around 04/26/2022).         SUBJECTIVE  PCP: David Ellis  Tplant: Amy Mottle  Optho:  Herma Ard  Wound: Keagy  Podiatry: Dr. Ether Griffins, [facility]   Derm: David Ellis, [facility]    HPI: David Ellis is a 76 y.o. male who I last saw 07/2021 returns for follow up of T2DM with hx of kidney transplant.    Here w wife David Ellis.   Since last visit had a fall w broken arm for which he is recoverin -- able to push himself up to get out of  bed on his own today which is progress!   Also one severe hypglcyemia since last visit, but prior to changing to mdi.    Returns today having changed from u500 to MDI of insulin:  Trulicity 1.5mg  weekly  Tresiba u200 80units daily  Humalog (or could give Novolog) 0-20units with meals. No strict correction.    Using CGM as reviewed/downloaded for past 28 days in attached CMA note.  Estimated A1c 7.8 per past 30 days of CGM which is improvement from prior values.   Noted trend downwards overnight.  He then often does not give bfast coverage in setting of borderline hypoglycemia w subsequent rise.   Often gives 5-20 units with lunch/dinner, occ bfast.   In general trend of low nromal in AM, then rise as day progressess.    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   CKD followed by Nephrology. Has restarted ARB. Not on SGLT2i if I remember correctly due to hx hyperK.  Peripheral neuropathy w hx of amputation.  On Neurontin.  Foot exam deferred today.  BP tightly controlled. Maintained on BB and ARB. Prn lasix for edema  Lipids on low dose pravastatin 20.      Endocrine Obtained / Updated Problem List:   1. T2DM dx 1992, insulin roughly 2000:   Proliferative DR s/p hx vitreous hemorrhage and endolaser therapy..  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 No monofilament 07/2021  Hypoglycemic emergency requiring EMS 03/2011.   Historically, Victoza, Ozempic, Trulicity were not covered or too $$.   Offered to check cpeptide to see if he would qualify for insulin pump under medcare, though he is not interested.    Off MTF since end of 2015/early 2016.   2. 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   3. HTN   4. Nephrolithiasis   5. OSA on cpap   6. Aflutter s/p ablation 06/2011, atrial fibrillation  7. PE    Patient Active Problem List   Diagnosis    Type II diabetes mellitus with renal manifestations, uncontrolled    Essential (primary) hypertension    History of kidney transplant    Mixed hyperlipidemia    Retinopathy due to secondary diabetes (CMS-HCC)    Transplanted kidney    Other pulmonary embolism without acute cor pulmonale (CMS-HCC)    Edema    Right ventricular dysfunction    Atrial fibrillation (CMS-HCC)    Aftercare following organ transplant    Diverticulosis of colon    Routine general medical examination at a health care facility    OSA on CPAP    Diabetic polyneuropathy associated with type 2 diabetes mellitus (CMS-HCC)    Diabetic ulcer of left foot associated with type 2 diabetes mellitus (CMS-HCC)    Toe amputation status    Left knee pain    BCC (basal cell carcinoma), face    Acquired absence of other toe(s), unspecified side (CMS-HCC)    History of colonic polyps    Type 2 diabetes mellitus with foot ulcer (CODE) (CMS-HCC)    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)    Hypoglycemia associated with type 2 diabetes mellitus (CMS-HCC)    Class 2 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    Immunosuppressed status (CMS-HCC)    Syncope    Non-healing open wound of toe    At high risk for falls     Past Medical History:   Diagnosis Date    Arthritis     Atrial flutter (CMS-HCC)     Cataract     Diabetes mellitus (CMS-HCC)     Diabetic nephropathy (CMS-HCC)     Diabetic retinopathy (CMS-HCC)     Fractures Ganglion cyst     Hand injury     Heart disease     Hyperlipidemia     Hypertension     Impaired mobility     Joint pain     Lack of access to transportation  Osteomyelitis (CMS-HCC) 11/2014    Pulmonary embolism (CMS-HCC) 05/2014    Retinopathy due to secondary diabetes (CMS-HCC) 01/2013    Squamous cell skin cancer 11/2013    Stroke (CMS-HCC)     Tear of meniscus of knee     Transplanted kidney 08/21/2011     Medicines:     Current Outpatient Medications   Medication Sig Dispense Refill    insulin ASPART (NOVOLOG FLEXPEN U-100 INSULIN) 100 unit/mL (3 mL) injection pen 15-25 units with meals + correction. Max 100units daily. 30 mL 12    acetaminophen (TYLENOL) 325 MG tablet Take by mouth.      aspirin (ECOTRIN) 81 MG tablet Take 1 tablet (81 mg total) by mouth daily as needed (when flying).      blood-glucose meter kit Use as instructed. 1 each 0    cholecalciferol, vitamin D3-50 mcg, 2,000 unit,, 50 mcg (2,000 unit) tablet Take 1 tablet (50 mcg total) by mouth daily.      flash glucose sensor (FREESTYLE LIBRE 14 DAY SENSOR) kit by Other route every fourteen (14) days. 2 each 11    furosemide (LASIX) 40 MG tablet Take 1-2 tablets daily as needed for edema. (Patient taking differently: 1 tablet (40 mg total). 40 mg daily. And 1 additional tablet for edema.) 180 tablet 3    gabapentin (NEURONTIN) 300 MG capsule TAKE 1 CAPSULE BY MOUTH TWICE DAILY AS DIRECTED (Patient taking differently: Take 1 capsule (300 mg total) by mouth two (2) times a day.) 180 capsule 3    HUMALOG KWIKPEN INSULIN 100 unit/mL injection pen INJECT 15-25 UNITS WITH MEALS + CORRECTION **MAX 100 UNITS A DAY** 60 mL 3    insulin degludec 200 unit/mL (3 mL) InPn Inject 0.4 mL (80 Units total) under the skin daily. Titrate up to 100units daily as instructed. 45 mL 3    losartan (COZAAR) 25 MG tablet Take 1 tablet (25 mg total) by mouth daily. 90 tablet 3    magnesium chloride (SLOW_MAG) 64 mg TbEC Take 128 mg by mouth three (3) times a day (at 6am, noon and 6pm). (2 tablets) 9am 1 pm and 9 pm      metoprolol succinate (TOPROL-XL) 100 MG 24 hr tablet TAKE ONE TABLET TWICE DAILY 180 tablet 1    miscellaneous medical supply Misc 1 application by Miscellaneous route daily as needed. Lightweight wheelchair. 1 each 0    miscellaneous medical supply Misc 1 application by Miscellaneous route nightly. Recommend CPAP 14 cm H2O with EPR 3 and heated humidifier for nasal dryness, mask: ResMed Activa Lt nasal mask (Large). 1 each 0    multivitamin capsule Take 1 capsule by mouth daily.      MYFORTIC 180 mg EC tablet Take 3 tablets (540 mg total) by mouth two (2) times a day. 540 tablet 3    oxyCODONE (ROXICODONE) 5 MG immediate release tablet Take 1 tablet (5 mg total) by mouth two (2) times a day as needed for pain. 14 tablet 0    pen needle, diabetic (BD ULTRA-FINE NANO PEN NEEDLE) 32 gauge x 5/32 (4 mm) Ndle 1 each by Other route Three (3) times a day before meals. 300 each 3    pravastatin (PRAVACHOL) 20 MG tablet TAKE ONE TABLET BY MOUTH EVERY DAY 90 tablet 3    tacrolimus (PROGRAF) 1 MG capsule Take 3 capsules (3 mg total) by mouth in the morning AND 2 capsules (2 mg total) in the evening. 450 capsule 3    TRULICITY 1.5  mg/0.5 mL PnIj INJECT 1.5MG  (0.5ML) SUBCUTANEOUSLY ONCEA WEEK 2 mL 11    warfarin (JANTOVEN) 2.5 MG tablet TAKE 1 TABLET BY MOUTH ONCE DAILY 30 tablet 11    warfarin (JANTOVEN) 5 MG tablet Take 1 tablet (5 mg total) by mouth daily with evening meal. TAKE 6 DAYS A WEEK - Tuesday through Sunday 90 tablet 3     No current facility-administered medications for this visit.     Facility-Administered Medications Ordered in Other Visits   Medication Dose Route Frequency Provider Last Rate Last Admin    bevacizumab    PRN (once a day) Melvyn Novas, MD   2.5 mg at 01/28/17 1610       Social History: reviewed: his wife was kidney donor. rare etoh. no cig. 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     Other past medical history, medications, allergies and problem list are reviewed in the medical record    REVIEW OF SYSTEMS: Pertinent positives and negatives as in HPI, otherwise, all other systems reviewed are negative.     OBJECTIVE   Vitals:    10/24/21 0937   BP: 117/73   Pulse: 78   Weight: (!) 110.7 kg (244 lb)   Height: 185.4 cm (6' 1)     Body mass index is 32.19 kg/m??.    BP Readings from Last 3 Encounters:   10/24/21 117/73   08/21/21 114/66   07/28/21 185/77     Wt Readings from Last 3 Encounters:   10/24/21 (!) 110.7 kg (244 lb)   08/29/21 (!) 113.9 kg (251 lb)   08/21/21 (!) 113.9 kg (251 lb)     PSYCH: calm, cooperative with full affect  EYES: eomi, sclera anicteric  Foot exam deferred today    Pertinent labs:  Office Visit on 10/24/2021   Component Date Value Ref Range Status    Glucose, POC 10/24/2021 105  70 - 179 mg/dL Final   Appointment on 10/24/2021   Component Date Value Ref Range Status    Sodium 10/24/2021 142  135 - 145 mmol/L Final    Potassium 10/24/2021 5.8 (H)  3.4 - 4.8 mmol/L Final    Chloride 10/24/2021 107  98 - 107 mmol/L Final    CO2 10/24/2021 31.3 (H)  20.0 - 31.0 mmol/L Final    Anion Gap 10/24/2021 4 (L)  5 - 14 mmol/L Final    BUN 10/24/2021 23  9 - 23 mg/dL Final    Creatinine 96/09/5407 1.23 (H)  0.60 - 1.10 mg/dL Final    BUN/Creatinine Ratio 10/24/2021 19   Final    eGFR CKD-EPI (2021) Male 10/24/2021 62  >=60 mL/min/1.80m2 Final    eGFR calculated with CKD-EPI 2021 equation in accordance with [employer] and [employer] of Nephrology Task Force recommendations.    Glucose 10/24/2021 109 (H)  70 - 99 mg/dL Final    Calcium 81/19/1478 9.6  8.7 - 10.4 mg/dL Final    Albumin 29/56/2130 3.7  3.4 - 5.0 g/dL Final    Total Protein 10/24/2021 6.7  5.7 - 8.2 g/dL Final    Total Bilirubin 10/24/2021 0.6  0.3 - 1.2 mg/dL Final    AST 86/57/8469 23  <=34 U/L Final    ALT 10/24/2021 19  10 - 49 U/L Final    Alkaline Phosphatase 10/24/2021 124 (H)  46 - 116 U/L Final    PT 10/24/2021 20.0 (H)  9.8 - 12.8 sec Final    INR 10/24/2021 1.73   Final  Magnesium 10/24/2021 2.0  1.6 - 2.6 mg/dL Final    Phosphorus 09/81/1914 3.3  2.4 - 5.1 mg/dL Final    Tacrolimus, Timed 10/24/2021 4.2  ng/mL Final    WBC 10/24/2021 9.6  3.6 - 11.2 10*9/L Final    RBC 10/24/2021 5.34  4.26 - 5.60 10*12/L Final    HGB 10/24/2021 15.1  12.9 - 16.5 g/dL Final    HCT 78/29/5621 45.6  39.0 - 48.0 % Final    MCV 10/24/2021 85.3  77.6 - 95.7 fL Final    MCH 10/24/2021 28.3  25.9 - 32.4 pg Final    MCHC 10/24/2021 33.2  32.0 - 36.0 g/dL Final    RDW 30/86/5784 13.7  12.2 - 15.2 % Final    MPV 10/24/2021 7.6  6.8 - 10.7 fL Final    Platelet 10/24/2021 212  150 - 450 10*9/L Final    nRBC 10/24/2021 0  <=4 /100 WBCs Final    Neutrophils % 10/24/2021 81.4  % Final    Lymphocytes % 10/24/2021 11.0  % Final    Monocytes % 10/24/2021 6.5  % Final    Eosinophils % 10/24/2021 0.8  % Final    Basophils % 10/24/2021 0.3  % Final    Absolute Neutrophils 10/24/2021 7.8  1.8 - 7.8 10*9/L Final    Absolute Lymphocytes 10/24/2021 1.1  1.1 - 3.6 10*9/L Final    Absolute Monocytes 10/24/2021 0.6  0.3 - 0.8 10*9/L Final    Absolute Eosinophils 10/24/2021 0.1  0.0 - 0.5 10*9/L Final    Absolute Basophils 10/24/2021 0.0  0.0 - 0.1 10*9/L Final     Lab Results   Component Value Date    Hemoglobin A1C 8.4 (H) 09/20/2021    Hemoglobin A1C 8.6 (H) 07/28/2021    Hemoglobin A1C 9.0 (H) 06/08/2021    Hemoglobin A1C 9.4 (H) 04/07/2021    HB A1C, RAP/HGATE 6.7 (H) 04/07/2012    HB A1C, RAP/HGATE 7.4 (H) 03/30/2011    HGB A1C, POC 8.8 (H) 07/28/2020    HGB A1C, POC 7.3 (H) 01/27/2020    HGB A1C, POC 9.5 (H) 11/18/2018    HGB A1C, POC 9.3 (H) 04/24/2018    Hemoglobin A1c 7.6 (H) 05/17/2014     No components found for: SODIUM  Lab Results   Component Value Date    K 5.8 (H) 10/24/2021     Lab Results   Component Value Date    CREATININE 1.23 (H) 10/24/2021     Lab Results   Component Value Date    PTH 114.5 (H) 08/21/2019    CALCIUM 9.6 10/24/2021     Lab Results   Component Value Date    ALT 19 10/24/2021    ALT 22 04/21/2015     Lab Results   Component Value Date    CHOL 129 09/20/2021     Lab Results   Component Value Date    LDL 64 09/20/2021     Lab Results   Component Value Date    HDL 37 (L) 09/20/2021     Lab Results   Component Value Date    TRIG 140 09/20/2021     Lab Results   Component Value Date    TSH 2.202 07/02/2017    TSH 1.840 07/13/2015    TSH 1.420 07/09/2015     Lab Results   Component Value Date    TSH 2.202 07/02/2017     No results found for: CBC  No results found for:  [id number]  Lab Results   Component Value Date    VITD2 <5 08/17/2015    VITD3 42 08/17/2015    VITDTOTAL 34.6 07/28/2021     No results found for: Christa See, MSHCGMOM, MALBCRERAT  Lab Results   Component Value Date    Protein, Ur 53.9 07/28/2021    Protein, Ur 15 08/05/2014    Protein/Creatinine Ratio, Urine 0.413 07/28/2021    Protein/Creatinine Ratio, Urine 0.116 08/05/2014       IMAGING:

## 2021-10-24 NOTE — Unmapped (Signed)
I have written a new order for colonoscopy referring to my telephone note today re:   Coumadin and lovenox bridge.    Please have them call for colonoscpoy now and ask the person on the phone to look at my telephone note.  As he gets to the date of the colonoscopy, he should reach out to me for script for lovenox.    [name], M.D.     [facility] at [facility]   [address]  [address]  [place], [place]  [address]  [phone]

## 2021-10-25 NOTE — Unmapped (Signed)
Spoke with patient regarding these instructions, and also left a detailed voice message as requested by patient.

## 2021-10-26 NOTE — Unmapped (Signed)
[facility] Refill Coordination Note    Specialty Medication(s) to be Shipped:   Transplant: Myfortic 180mg  and tacrolimus 1mg     Other medication(s) to be shipped: No additional medications requested for fill at this time     David Ellis, DOB: 04-Sep-1945  Phone: [phone] (home)       All above HIPAA information was verified with patient.     Was a Nurse, learning disability used for this call? No    Completed refill call assessment today to schedule patient's medication shipment from the [facility] [phone]).  All relevant notes have been reviewed.     Specialty medication(s) and dose(s) confirmed: Regimen is correct and unchanged.   Changes to medications: Wallie reports no changes at this time.  Changes to insurance: No  New side effects reported not previously addressed with a pharmacist or physician: None reported  Questions for the pharmacist: No    Confirmed patient received a Conservation officer, historic buildings and a Surveyor, mining with first shipment. The patient will receive a drug information handout for each medication shipped and additional FDA Medication Guides as required.       DISEASE/MEDICATION-SPECIFIC INFORMATION        N/A    SPECIALTY MEDICATION ADHERENCE     Medication Adherence    Patient reported X missed doses in the last month: 0  Specialty Medication: Myfortic 180mg   Patient is on additional specialty medications: Yes  Additional Specialty Medications: Tacrolimus 1mg   Patient Reported Additional Medication X Missed Doses in the Last Month: 0  Patient is on more than two specialty medications: No  Adherence tools used: patient uses a pill box to manage medications  Support network for adherence: family member              Were doses missed due to medication being on hold? No    Myfortic 180 mg: 7 days of medicine on hand   Tacrolimus 1 mg: 7 days of medicine on hand     REFERRAL TO PHARMACIST     Referral to the pharmacist: Not needed      [facility]     Shipping address confirmed in Epic.     Delivery Scheduled: Yes, Expected medication delivery date: 11/01/21.     Medication will be delivered via Next Day Courier to the prescription address in Epic WAM.    [name]   [facility] Specialty Pharmacist

## 2021-10-31 MED FILL — TACROLIMUS 1 MG CAPSULE, IMMEDIATE-RELEASE: ORAL | 30 days supply | Qty: 150 | Fill #6

## 2021-10-31 MED FILL — MYFORTIC 180 MG TABLET,DELAYED RELEASE: ORAL | 30 days supply | Qty: 180 | Fill #11

## 2021-11-01 ENCOUNTER — Ambulatory Visit: Admit: 2021-11-01 | Discharge: 2021-11-01 | Payer: MEDICARE

## 2021-11-01 NOTE — Unmapped (Signed)
error 

## 2021-11-01 NOTE — Unmapped (Signed)
ORTHOPAEDIC NOTE     David Ellis L. 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     Follow up: 3 months, 3 views right shoulder       Chief Complaint:     Recheck right shoulder     SUBJECTIVE:     HPI: David Ellis is a RHD 76 y.o. with a PMHx atrial flutter on Coumadin, remote history of PE, DM on insulin with neuropathy, CAD, HLD, HTN, CVA, CKD status post renal transplant who enjoys playing golf presenting to Clinic for reevaluation of right proximal humerus fracture sustained on 07/27/2021.  He has weaned out of the sling and is working with therapy.  He feels that he is making progress but still complains of stiffness and difficulty reaching the top of his head.       Allergies  Allergies   Allergen Reactions   ??? Egg Derived Rash   ??? Enalapril Cough   ??? Epinephrine Palpitations   ??? Grass Pollen-[place], Standard Itching   ??? Levofloxacin Other (See Comments)     Per patient   ??? Lisinopril Rash   ??? Mepivacaine Hcl Palpitations   ??? Penicillins Other (See Comments)     As child  Tolerated amp/sulbactam without side effects during 10/2014 hospitalization     Past Medical History  Past Medical History:   Diagnosis Date   ??? Arthritis    ??? Atrial flutter (CMS-HCC)    ??? Cataract    ??? Diabetes mellitus (CMS-HCC)    ??? Diabetic nephropathy (CMS-HCC)    ??? Diabetic retinopathy (CMS-HCC)    ??? Fractures    ??? Ganglion cyst    ??? Hand injury    ??? Heart disease    ??? Hyperlipidemia    ??? Hypertension    ??? Impaired mobility    ??? Joint pain    ??? Lack of access to transportation    ??? Osteomyelitis (CMS-HCC) 11/2014   ??? Pulmonary embolism (CMS-HCC) 05/2014   ??? Retinopathy due to secondary diabetes (CMS-HCC) 01/2013   ??? 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 Stroke (CMS-HCC)    ??? Tear of meniscus of knee    ??? Transplanted kidney 08/21/2011        PHYSICAL EXAM:     MSK: Right shoulder  Inspection: No significant edema, no erythema, skin intact  Palpation: No tenderness on the proximal humerus  ROM: Forward flexion 80, abduction 70, external rotation 20  Strength: 4/5 strength in each plane range of motion of shoulder  normal sensation RUE  radial pulses easily palpable     Imaging   Four views of the right Shoulder independently reviewed and  interpreted by myself show Further healing of comminuted impacted right proximal humerus fracture with continued medial osseous fragmentation with acromioclavicular and glenohumeral degenerative changes. No other obvious fractures, lucencies, dislocations, or acute abnormalities.    MEDICAL DECISION MAKING (level of service defined by 2/3 elements)     Number/Complexity of Problems Addressed 1 acute, uncomplicated illness or injury (99203/99213)   Amount/Complexity of Data to be Reviewed/Analyzed Independent interpretation of a test performed by another physician/other qualified health care professional (99204/99214)   Risk of Complications/Morbidity/Mortality of Management Over-the-counter Medications (99203/99213)   DME ORDER:  Dx:  ,                   cc:  [name], MD  *Patient note was created using Dragon Dictation sotware. Errors in syntax or grammar may not have been identified and edited on initial review.

## 2021-11-02 NOTE — Unmapped (Signed)
See encounter from 10/24/21, Dr. Dellis Filbert. Hold Coumadin x 5 days. Lovenox bridge.     5 days prior to procedure, stop Coumadin  3 days prior to procedure, start lovenox 110 mg subcutaneous twice a day   The morning of colonoscopy DO NOT TAKE LOVENOX or coumadin     Undergo colonoscopy.  If the endoscopist does NOT feel large bleeding risk    The night of the colonoscopy restart coumadin as prior dosing  THe morning after colonoscopy start lovenox 110 mg subcutaneous twice a day until INR hits 2.0  Check PT/INR 4 days after restarting coumadin.   When INR hits 2.0 or greater, stop lovenox.

## 2021-11-03 MED ORDER — BD ULTRA-FINE NANO PEN NEEDLE 32 GAUGE X 5/32" (4 MM)
3 refills | 0 days | Status: CP
Start: 2021-11-03 — End: ?

## 2021-11-03 NOTE — Unmapped (Addendum)
I received request for information from GI re: colonoscopy and warfarin.    My comments:      HI there.      Yes, David Ellis is on coumadin for afib, high risk stroke risk, and histoyr of massive PE.  I would recommend lovenox bridge:    May stop coumadin 5 days prior to procedure  3 days prior to procedure, start lovenox 1 mg/kg subcutaneous twice a day 113 mg subcutaneous twice a day.    The morning of colonoscopy, hold the lovenox.  Proceed with colonoscopy.      If no significant bleeding risk from the colonscopy (no large polyp removed), I would   Start prior dose of coumadin the night of the colonoscopy at prior dose  The morning after colonoscopy, start lovenox 1 mg/kg subcutaneous twice a day.  Check INR 5 days after coumdin restarted.    COntinue lovenox until INR is 2.0 or greater.    Please let me know timing of colonoscopy and I'll send in scripts for lovenox for patient.      THis is documented for his chart.    [name], M.D.     [facility] Internal Medicine at [facility]   [address]  [address]  [place], [place]  [address]  [phone]       ----- Message from Diona Fanti, RN sent at 10/26/2021  3:06 PM EDT -----  Dear Dr. Dellis Filbert,    This patient has an order for a/an Colonoscopy. Could you please advise regarding Warfarin prior to procedure?   Thank you!     Patient: David Ellis    DOB: 10/21/1945    MRN: [id number]    Procedure: Colonoscopy    Anticoagulant: Coumadin (Warfarin)    Per ASGE Guidelines, GI Procedures recommends patients hold their Coumadin# for five (5) days prior to procedure. Please indicate if this recommendation is acceptable for the patient.    Please advise if patient should continue their anticoagulant or if you disagree with these recommendations.    #If you determine that your patient needs bridging with enoxaparin while off of their Coumadin, please provide orders and instructions to the patient. Please instruct the patient to stop the  enoxaparin 24 hrs prior to their procedure.    Pre-Operative Cardiac Risk Evaluation  Patient's risk is considered to be:      Low              Intermediate                  High        _____ No further cardiac testing is needed    _____ Patient is medically optimized for procedure    _____ This patient needs to be seen in the office for a cardiac evaluation    _____ Patient is not cleared for procedure due to: __________________________________      Thank you!    Sincerely,     [facility] Procedures Triage Nurse   P: [phone]   F: [phone]

## 2021-11-08 ENCOUNTER — Ambulatory Visit: Admit: 2021-11-08 | Discharge: 2021-11-09 | Payer: MEDICARE

## 2021-11-08 LAB — PROTIME-INR
INR: 2.05
PROTIME: 23.9 s — ABNORMAL HIGH (ref 9.8–12.8)

## 2021-11-20 DIAGNOSIS — Z940 Kidney transplant status: Principal | ICD-10-CM

## 2021-11-20 MED ORDER — MYFORTIC 180 MG TABLET,DELAYED RELEASE
ORAL_TABLET | Freq: Two times a day (BID) | ORAL | 3 refills | 90 days
Start: 2021-11-20 — End: 2022-11-20

## 2021-11-20 NOTE — Unmapped (Signed)
[facility] Specialty Pharmacy Refill Coordination Note    Specialty Medication(s) to be Shipped:   Transplant: Myfortic 180mg  and tacrolimus 1mg     Other medication(s) to be shipped: No additional medications requested for fill at this time     David Ellis, DOB: August 06, 1945  Phone: [phone] (home)       All above HIPAA information was verified with patient.     Was a Nurse, learning disability used for this call? No    Completed refill call assessment today to schedule patient's medication shipment from the [facility] [phone]).  All relevant notes have been reviewed.     Specialty medication(s) and dose(s) confirmed: Regimen is correct and unchanged.   Changes to medications: [place] reports no changes at this time.  Changes to insurance: No  New side effects reported not previously addressed with a pharmacist or physician: None reported  Questions for the pharmacist: No    Confirmed patient received a Conservation officer, historic buildings and a Surveyor, mining with first shipment. The patient will receive a drug information handout for each medication shipped and additional FDA Medication Guides as required.       DISEASE/MEDICATION-SPECIFIC INFORMATION        N/A    SPECIALTY MEDICATION ADHERENCE     Medication Adherence    Patient reported X missed doses in the last month: 0  Specialty Medication: Myfortic 180 mg  Patient is on additional specialty medications: Yes  Additional Specialty Medications: Tacrolimus 1 mg  Patient Reported Additional Medication X Missed Doses in the Last Month: 0  Patient is on more than two specialty medications: No  Adherence tools used: patient uses a pill box to manage medications  Support network for adherence: family member              Were doses missed due to medication being on hold? No    Myfortic  180 mg: 10 days of medicine on hand   tacrolimus 1 mg: 10 days of medicine on hand       REFERRAL TO PHARMACIST     Referral to the pharmacist: Not needed      [facility] [employer] Shipping address confirmed in Epic.     Delivery Scheduled: Yes, Expected medication delivery date: 11/30/21.     Medication will be delivered via Next Day Courier to the prescription address in Epic WAM.    Quintella Reichert   [facility] Specialty Technician

## 2021-11-27 MED ORDER — PRAVASTATIN 20 MG TABLET
ORAL_TABLET | 3 refills | 0 days | Status: CP
Start: 2021-11-27 — End: ?

## 2021-11-27 MED ORDER — WARFARIN 5 MG TABLET
ORAL_TABLET | 0 refills | 0.00000 days | Status: CP
Start: 2021-11-27 — End: ?

## 2021-11-27 NOTE — Unmapped (Signed)
Patient is requesting the following refill  Requested Prescriptions     Pending Prescriptions Disp Refills    warfarin (JANTOVEN) 5 MG tablet [Pharmacy Med Name: WARFARIN SODIUM 5 MG TAB] 90 tablet 3     Sig: TAKE 1 TABLET BY MOUTH DAILY WITH EVENING MEAL. TAKE 6 DAYS A WEEK (TUESDAY THROUGH SUNDAY) AS DIRECTED.       Order pended. Please advise. Thanks    Last OV: 07/18/2021   Next OV: Visit date not found

## 2021-11-27 NOTE — Unmapped (Signed)
Patient is requesting the following refill  Requested Prescriptions     Pending Prescriptions Disp Refills    pravastatin (PRAVACHOL) 20 MG tablet [Pharmacy Med Name: PRAVASTATIN SODIUM 20 MG TAB] 90 tablet 3     Sig: TAKE ONE TABLET BY MOUTH EVERY DAY       Recent Visits  Date Type Provider Dept   07/18/21 Office Visit Sharlee Blew, MD [facility] Internal Medicine [facility] [place]   06/15/21 Office Visit Yehuda Budd, MD [facility] Internal Medicine Cardiology   01/19/21 Office Visit Sharlee Blew, MD [facility] Internal Medicine [facility] [place]   12/08/20 Office Visit Yehuda Budd, MD [facility] Internal Medicine Cardiology   Showing recent visits within past 365 days with a meds authorizing provider and meeting all other requirements  Future Appointments  Date Type Provider Dept   01/18/22 Appointment Yehuda Budd, MD [facility] Internal Medicine Cardiology   Showing future appointments within next 365 days with a meds authorizing provider and meeting all other requirements       Labs: Cholesterol:   Cholesterol (mg/dL)   Date Value   16/03/9603 129     Cholesterol, Total (mg/dL)   Date Value   54/02/8118 141   ,   Triglycerides   Date Value   09/20/2021 140 mg/dL   14/78/2956 213 mg/dL (H)   08/65/7846 962   ,   HDL (mg/dL)   Date Value   95/28/4132 37 (L)   04/21/2015 36 (L)   ,   LDL Calculated (mg/dL)   Date Value   44/06/270 64   04/21/2015 74

## 2021-11-28 ENCOUNTER — Ambulatory Visit: Admit: 2021-11-28 | Discharge: 2021-11-29 | Payer: MEDICARE

## 2021-11-28 LAB — COMPREHENSIVE METABOLIC PANEL
ALBUMIN: 3.5 g/dL (ref 3.4–5.0)
ALKALINE PHOSPHATASE: 98 U/L (ref 46–116)
ALT (SGPT): 13 U/L (ref 10–49)
ANION GAP: 5 mmol/L (ref 5–14)
AST (SGOT): 24 U/L (ref <=34)
BILIRUBIN TOTAL: 0.8 mg/dL (ref 0.3–1.2)
BLOOD UREA NITROGEN: 23 mg/dL (ref 9–23)
BUN / CREAT RATIO: 20
CALCIUM: 9.5 mg/dL (ref 8.7–10.4)
CHLORIDE: 107 mmol/L (ref 98–107)
CO2: 29.4 mmol/L (ref 20.0–31.0)
CREATININE: 1.15 mg/dL — ABNORMAL HIGH
EGFR CKD-EPI (2021) MALE: 67 mL/min/{1.73_m2} (ref >=60)
GLUCOSE RANDOM: 147 mg/dL — ABNORMAL HIGH (ref 70–99)
POTASSIUM: 4.9 mmol/L — ABNORMAL HIGH (ref 3.4–4.8)
PROTEIN TOTAL: 6.8 g/dL (ref 5.7–8.2)
SODIUM: 141 mmol/L (ref 135–145)

## 2021-11-28 LAB — CBC W/ AUTO DIFF
BASOPHILS ABSOLUTE COUNT: 0 10*9/L (ref 0.0–0.1)
BASOPHILS RELATIVE PERCENT: 0.3 %
EOSINOPHILS ABSOLUTE COUNT: 0.1 10*9/L (ref 0.0–0.5)
EOSINOPHILS RELATIVE PERCENT: 1.5 %
HEMATOCRIT: 44.4 % (ref 39.0–48.0)
HEMOGLOBIN: 15 g/dL (ref 12.9–16.5)
LYMPHOCYTES ABSOLUTE COUNT: 1.2 10*9/L (ref 1.1–3.6)
LYMPHOCYTES RELATIVE PERCENT: 15 %
MEAN CORPUSCULAR HEMOGLOBIN CONC: 33.9 g/dL (ref 32.0–36.0)
MEAN CORPUSCULAR HEMOGLOBIN: 28.6 pg (ref 25.9–32.4)
MEAN CORPUSCULAR VOLUME: 84.3 fL (ref 77.6–95.7)
MEAN PLATELET VOLUME: 7.3 fL (ref 6.8–10.7)
MONOCYTES ABSOLUTE COUNT: 0.5 10*9/L (ref 0.3–0.8)
MONOCYTES RELATIVE PERCENT: 6.8 %
NEUTROPHILS ABSOLUTE COUNT: 5.9 10*9/L (ref 1.8–7.8)
NEUTROPHILS RELATIVE PERCENT: 76.4 %
NUCLEATED RED BLOOD CELLS: 0 /100{WBCs} (ref <=4)
PLATELET COUNT: 199 10*9/L (ref 150–450)
RED BLOOD CELL COUNT: 5.26 10*12/L (ref 4.26–5.60)
RED CELL DISTRIBUTION WIDTH: 14.1 % (ref 12.2–15.2)
WBC ADJUSTED: 7.7 10*9/L (ref 3.6–11.2)

## 2021-11-28 LAB — LIPID PANEL
CHOLESTEROL/HDL RATIO SCREEN: 3.6 (ref 1.0–4.5)
CHOLESTEROL: 131 mg/dL (ref <=200)
HDL CHOLESTEROL: 36 mg/dL — ABNORMAL LOW (ref 40–60)
LDL CHOLESTEROL CALCULATED: 68 mg/dL (ref 40–99)
NON-HDL CHOLESTEROL: 95 mg/dL (ref 70–130)
TRIGLYCERIDES: 133 mg/dL (ref 0–150)
VLDL CHOLESTEROL CAL: 26.6 mg/dL (ref 12–42)

## 2021-11-28 LAB — TACROLIMUS LEVEL, TIMED: TACROLIMUS BLOOD: 1.6 ng/mL

## 2021-11-28 LAB — HEMOGLOBIN A1C
ESTIMATED AVERAGE GLUCOSE: 192 mg/dL
HEMOGLOBIN A1C: 8.3 % — ABNORMAL HIGH (ref 4.8–5.6)

## 2021-11-28 LAB — PROTIME-INR
INR: 1.96
PROTIME: 22.8 s — ABNORMAL HIGH (ref 9.8–12.8)

## 2021-11-28 MED ORDER — WARFARIN 5 MG TABLET
ORAL_TABLET | 0 refills | 0 days
Start: 2021-11-28 — End: ?

## 2021-11-29 DIAGNOSIS — Z940 Kidney transplant status: Principal | ICD-10-CM

## 2021-11-29 MED ORDER — MYFORTIC 180 MG TABLET,DELAYED RELEASE
ORAL_TABLET | Freq: Two times a day (BID) | ORAL | 3 refills | 90 days | Status: CP
Start: 2021-11-29 — End: 2022-11-29
  Filled 2021-11-29: qty 180, 30d supply, fill #0

## 2021-11-29 MED FILL — TACROLIMUS 1 MG CAPSULE, IMMEDIATE-RELEASE: ORAL | 30 days supply | Qty: 150 | Fill #7

## 2021-12-11 DIAGNOSIS — Z79899 Other long term (current) drug therapy: Principal | ICD-10-CM

## 2021-12-11 DIAGNOSIS — Z940 Kidney transplant status: Principal | ICD-10-CM

## 2021-12-13 NOTE — Unmapped (Signed)
Abstraction Result Flowsheet Data    This patient's last AWV date: [facility] Last Medicare Wellness Visit Date: 07/19/2020  This patients last WCC/CPE date: : Not Found      Reason for Encounter  Reason for Encounter: Outreach  Primary Reason for Outreach: AWV  Outreach Call Outcome: Left message

## 2021-12-18 ENCOUNTER — Other Ambulatory Visit: Admit: 2021-12-18 | Discharge: 2021-12-19 | Payer: MEDICARE

## 2021-12-18 LAB — TACROLIMUS LEVEL, TROUGH: TACROLIMUS, TROUGH: 4.7 ng/mL — ABNORMAL LOW (ref 5.0–15.0)

## 2021-12-21 NOTE — Unmapped (Signed)
[facility] Specialty Pharmacy Refill Coordination Note    Specialty Medication(s) to be Shipped:   Transplant: Myfortic 180mg  and tacrolimus 1mg     Other medication(s) to be shipped: No additional medications requested for fill at this time     David Ellis, DOB: February 09, 1946  Phone: [phone] (home)       All above HIPAA information was verified with patient.     Was a Nurse, learning disability used for this call? No    Completed refill call assessment today to schedule patient's medication shipment from the [facility] [phone]).  All relevant notes have been reviewed.     Specialty medication(s) and dose(s) confirmed: Regimen is correct and unchanged.   Changes to medications: [name] reports no changes at this time.  Changes to insurance: No  New side effects reported not previously addressed with a pharmacist or physician: None reported  Questions for the pharmacist: No    Confirmed patient received a Conservation officer, historic buildings and a Surveyor, mining with first shipment. The patient will receive a drug information handout for each medication shipped and additional FDA Medication Guides as required.       DISEASE/MEDICATION-SPECIFIC INFORMATION        N/A    SPECIALTY MEDICATION ADHERENCE     Medication Adherence    Patient reported X missed doses in the last month: 0  Specialty Medication: myfortic 180  Patient is on additional specialty medications: Yes  Additional Specialty Medications: Tacrolimus   Patient Reported Additional Medication X Missed Doses in the Last Month: 0  Patient is on more than two specialty medications: No  Adherence tools used: patient uses a pill box to manage medications  Support network for adherence: family member              Were doses missed due to medication being on hold? No    Myfortic  180  mg: 10 days of medicine on hand   Tacrolimus  1 mg: 10 days of medicine on hand       REFERRAL TO PHARMACIST     Referral to the pharmacist: Not needed      [facility] Shipping address confirmed in Epic.     Delivery Scheduled: Yes, Expected medication delivery date: 12/26/21.     Medication will be delivered via Next Day Courier to the prescription address in Epic WAM.    Quintella Reichert   [facility] Specialty Technician

## 2021-12-25 DIAGNOSIS — Z79899 Other long term (current) drug therapy: Principal | ICD-10-CM

## 2021-12-25 DIAGNOSIS — Z940 Kidney transplant status: Principal | ICD-10-CM

## 2021-12-25 DIAGNOSIS — Z1159 Encounter for screening for other viral diseases: Principal | ICD-10-CM

## 2021-12-25 DIAGNOSIS — N186 End stage renal disease: Principal | ICD-10-CM

## 2021-12-25 MED FILL — MYFORTIC 180 MG TABLET,DELAYED RELEASE: ORAL | 30 days supply | Qty: 180 | Fill #1

## 2021-12-25 MED FILL — TACROLIMUS 1 MG CAPSULE, IMMEDIATE-RELEASE: ORAL | 30 days supply | Qty: 150 | Fill #8

## 2021-12-28 ENCOUNTER — Ambulatory Visit: Admit: 2021-12-28 | Discharge: 2021-12-28 | Payer: MEDICARE | Attending: Nephrology | Primary: Nephrology

## 2021-12-28 ENCOUNTER — Ambulatory Visit: Admit: 2021-12-28 | Discharge: 2021-12-28 | Payer: MEDICARE

## 2021-12-28 DIAGNOSIS — Z940 Kidney transplant status: Principal | ICD-10-CM

## 2021-12-28 DIAGNOSIS — Z79899 Other long term (current) drug therapy: Principal | ICD-10-CM

## 2021-12-28 DIAGNOSIS — Z1159 Encounter for screening for other viral diseases: Principal | ICD-10-CM

## 2021-12-28 DIAGNOSIS — N186 End stage renal disease: Principal | ICD-10-CM

## 2021-12-28 DIAGNOSIS — E11621 Type 2 diabetes mellitus with foot ulcer: Principal | ICD-10-CM

## 2021-12-28 LAB — URINALYSIS WITH MICROSCOPY
BILIRUBIN UA: NEGATIVE
GLUCOSE UA: NEGATIVE
HYALINE CASTS: 12 /LPF — ABNORMAL HIGH (ref 0–1)
KETONES UA: NEGATIVE
LEUKOCYTE ESTERASE UA: NEGATIVE
NITRITE UA: NEGATIVE
PH UA: 5.5 (ref 5.0–9.0)
PROTEIN UA: 30 — AB
RBC UA: 1 /HPF (ref <3)
SPECIFIC GRAVITY UA: 1.025 (ref 1.005–1.030)
SQUAMOUS EPITHELIAL: <1 /HPF (ref 0–5)
UROBILINOGEN UA: 0.2
WBC UA: <1 /HPF (ref <2)

## 2021-12-28 LAB — COMPREHENSIVE METABOLIC PANEL
ALBUMIN: 3.6 g/dL (ref 3.4–5.0)
ALKALINE PHOSPHATASE: 97 U/L (ref 46–116)
ALT (SGPT): 17 U/L (ref 10–49)
ANION GAP: 7 mmol/L (ref 5–14)
AST (SGOT): 23 U/L (ref <=34)
BILIRUBIN TOTAL: 0.7 mg/dL (ref 0.3–1.2)
BLOOD UREA NITROGEN: 35 mg/dL — ABNORMAL HIGH (ref 9–23)
BUN / CREAT RATIO: 24
CALCIUM: 9.3 mg/dL (ref 8.7–10.4)
CHLORIDE: 105 mmol/L (ref 98–107)
CO2: 29.3 mmol/L (ref 20.0–31.0)
CREATININE: 1.43 mg/dL — ABNORMAL HIGH
EGFR CKD-EPI (2021) MALE: 51 mL/min/{1.73_m2} — ABNORMAL LOW (ref >=60)
GLUCOSE RANDOM: 140 mg/dL (ref 70–179)
POTASSIUM: 4.6 mmol/L (ref 3.4–4.8)
PROTEIN TOTAL: 6.7 g/dL (ref 5.7–8.2)
SODIUM: 141 mmol/L (ref 135–145)

## 2021-12-28 LAB — CBC W/ AUTO DIFF
BASOPHILS ABSOLUTE COUNT: 0 10*9/L (ref 0.0–0.1)
BASOPHILS RELATIVE PERCENT: 0.5 %
EOSINOPHILS ABSOLUTE COUNT: 0.1 10*9/L (ref 0.0–0.5)
EOSINOPHILS RELATIVE PERCENT: 1.3 %
HEMATOCRIT: 44.3 % (ref 39.0–48.0)
HEMOGLOBIN: 14.8 g/dL (ref 12.9–16.5)
LYMPHOCYTES ABSOLUTE COUNT: 1.3 10*9/L (ref 1.1–3.6)
LYMPHOCYTES RELATIVE PERCENT: 19.2 %
MEAN CORPUSCULAR HEMOGLOBIN CONC: 33.3 g/dL (ref 32.0–36.0)
MEAN CORPUSCULAR HEMOGLOBIN: 28.2 pg (ref 25.9–32.4)
MEAN CORPUSCULAR VOLUME: 84.6 fL (ref 77.6–95.7)
MEAN PLATELET VOLUME: 7.8 fL (ref 6.8–10.7)
MONOCYTES ABSOLUTE COUNT: 0.5 10*9/L (ref 0.3–0.8)
MONOCYTES RELATIVE PERCENT: 7.6 %
NEUTROPHILS ABSOLUTE COUNT: 4.8 10*9/L (ref 1.8–7.8)
NEUTROPHILS RELATIVE PERCENT: 71.4 %
NUCLEATED RED BLOOD CELLS: 0 /100{WBCs} (ref <=4)
PLATELET COUNT: 200 10*9/L (ref 150–450)
RED BLOOD CELL COUNT: 5.24 10*12/L (ref 4.26–5.60)
RED CELL DISTRIBUTION WIDTH: 13.8 % (ref 12.2–15.2)
WBC ADJUSTED: 6.7 10*9/L (ref 3.6–11.2)

## 2021-12-28 LAB — CMV DNA, QUANTITATIVE, PCR: CMV VIRAL LD: NOT DETECTED

## 2021-12-28 LAB — ALBUMIN / CREATININE URINE RATIO
ALBUMIN QUANT URINE: 15.3 mg/dL
ALBUMIN/CREATININE RATIO: 116.7 ug/mg — ABNORMAL HIGH (ref 0.0–30.0)
CREATININE, URINE: 131.1 mg/dL

## 2021-12-28 LAB — TACROLIMUS LEVEL: TACROLIMUS BLOOD: 5.1 ng/mL

## 2021-12-28 LAB — MAGNESIUM: MAGNESIUM: 2 mg/dL (ref 1.6–2.6)

## 2021-12-28 LAB — PROTEIN / CREATININE RATIO, URINE
CREATININE, URINE: 133.3 mg/dL
PROTEIN URINE: 41.9 mg/dL
PROTEIN/CREAT RATIO, URINE: 0.314

## 2021-12-28 LAB — PHOSPHORUS: PHOSPHORUS: 3.6 mg/dL (ref 2.4–5.1)

## 2021-12-28 MED ORDER — EMPAGLIFLOZIN 10 MG TABLET
ORAL_TABLET | Freq: Every morning | ORAL | 3 refills | 90 days | Status: CP
Start: 2021-12-28 — End: ?

## 2021-12-28 MED ORDER — AZITHROMYCIN 250 MG TABLET
ORAL_TABLET | 0 refills | 5 days | Status: CP
Start: 2021-12-28 — End: 2022-01-02

## 2021-12-28 NOTE — Unmapped (Addendum)
-   Please go ahead and call your dermatologist to be seen ahead of your scheduled appointment in November regarding the lesion behind your R ear. Its very possible this is another actinic keratosis (like those on your scalp), but given your increased risk of skin cancer with your immunosuppression it would be good to get it double-checked    - We have sent a prescription for azithromycin for you to bring on the cruise just in case you have traveler's diarrhea while on your cruise. You only should take this if you have diarrhea symptoms.     - We are going to discuss your starting Jardiance with Dr. Arvin Collard as a way to help decrease the protein in your urine and slow the progression of diabetic kidney disease. We have sent this medication to your pharmacy, but please DO NOT FILL IT until we confirm with Dr. Arvin Collard it is OK for you to start it.

## 2021-12-28 NOTE — Unmapped (Signed)
Transplant Nephrology Telephone Visit    Assessment and Plan    David Ellis is a 76 y.o. 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 He is seen today for his annual evaluation in the transplant clinic. He continues to follow closely with Dr. Arvin Collard as well. 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   - Serum creatinine is 1.43  mg/dL (baseline 1.0-9.3 mg/dL).   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   - He has a remote history of a HLA-DQ 7 donor specific antibody (noted on 09/17/2011) with all subsequent HLA antibody screens revealing no DSA's (most recent 07/28/21)  - UP/C today is 0.314 with ACR of 116.7 consistent with findings over the past 2 years.   - Diabetic nephropathy is the most likely cause of the sub-nephrotic proteinuria; we will discuss starting Jardiance 10 mg with Dr. Arvin Collard to slow progression of his diabetic nephropathy  - Given proteinuria is likely 2/2 diabetic nephropathy, no current plans to pursue a biopsy.      Travel  Patient going on cruise of [place] tomorrow. Out of abundance of caution, will prescribe azithromycin for them to have in case suffers from diarrhea symptoms.  - Azithromycin Rx sent    Immunosuppression management.    - He is tolerating his current immunosuppressive regimen well.   - Tacrolimus target level was 4.7 on 12/18/21. Level from today is pending.  - Will continue mycophenolate sodium 540 mg twice daily and tacrolimus at 3 mg AM/2 mg PM.     Hypertension, controlled.   - BP today 121/61 (target 130/80).  - Metoprolol succinate 100 mg bid and furosemide 40 to 80 mg daily will be continued.      Immunizations/Infection Prevention.   - Pneumococcal vaccines 07/13/16 Prevnar-13 08/26/13  - Flu vaccine 03/15/20  - Shingrix x 2 completed  - COVID-19 vaccine x 3 (dose #3 01/27/20), Bivalent x 1 (03/14/2021)  - S/p 2 doses Evusheld History of  DVT and pulmonary embolus.   - He will continue Coumadin.      Diabetes mellitus, type 2.    - Hemoglobin A1c 8.3 on 11/28/21   - Treatment includes dulaglutide 1.5 mg weekly, humulin with CGM and Tresiba  - He will follow in Endocrinology clinic and with Dr. Arvin Collard.   - As above, we will discuss starting Jardiance 10 daily for his likely diabetic nephropathy with Dr. Arvin Collard.    Atrial fibrillation, S/P ablation.  - Denies palpitations, exam with regular rhythm today  - He will continue Coumadin and metoprolol XL and avoidance of caffeine.     CVA, status post alteplase 07/2015.   - Denies new neurologic symptoms or residual deficits.  - Continue coumadin and statin.    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  - A recently noted wound on his foot is now healed.     Post-transplant hyperparathyroidism, CKD-MBD.    - Serum Ca 9.3, phos 3.5,  PTH 115.  - He does not meet criteria for initiation of Sensipar.    Cancer screening.   - He sees Dermatology at least every 6 months. Hx SCC, BCC and actinic keratoses.  - Concern today regarding posterior R ear lesion similar to known AK though have asked them to call derm to see them sooner than scheduled appt to ensure this is benign  - Renal US  today (12/28/21) is pending  - CXR today is negative   - Colonoscopy scheduled for 02/2022. Previously had been doing intermittent cologaurd testing rather than a colonoscopy due to being on coumadin.  - PSA most recently 07/2021 at 0.99    Obesity  - BMI 32.38 kg/m2 last year (34.07 kg/m2)   - Tolerating Trulicity well    Follow-up.    - He will continue to follow with his multiple physicians.  He will be seen in transplant clinic in March 2024.     History of Present Illness    Transplant History:  Issacc Ellis is 77 y.o. 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  He has no history of rejection with baseline serum creatinine 1.2-1.7 mg/dL. 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    His posttransplant course was complicated by a pulmonary embolus in December 2014, foot infections necessitating amputation of 2 toes, and a stroke in February 2017.    Interval history:     He presents today for his annual visit. He denies dysuria, allograft tenderness, fever or chills. He has a now healed foot ulcer and is being followed in [facility]. Blood pressure has been <130/80.  121/60 in clinic today.     He denies chest pain, shortness of breath, nausea, vomiting, or diarrhea. He denies new neurologic deficits. He denies COVID-19 infection or exposures. He has received 2 doses of Evusheld as well as 4 doses of COVID vaccine including 1 dose of the bivalent.    Today he reports a dark lesion beyond his R ear that he just noted a few weeks ago while getting a hair cut. Denies pain, itchiness, bleeding from the lesion. History of BCCs, SCCs and actinic keratoses. Follows with derm closely, but next visit with them is not until Nov 2023.    Review of Systems  All other systems are reviewed and are negative. A 10 systems review is completed.    Medications    Current Outpatient Medications   Medication Sig Dispense Refill    acetaminophen (TYLENOL) 325 MG tablet Take by mouth.      aspirin (ECOTRIN) 81 MG tablet Take 1 tablet (81 mg total) by mouth daily as needed (when flying).      cholecalciferol, vitamin D3-50 mcg, 2,000 unit,, 50 mcg (2,000 unit) tablet Take 1 tablet (50 mcg total) by mouth daily.      furosemide (LASIX) 40 MG tablet Take 1 tablet (40 mg total) by mouth daily. 40 mg daily. And 1 additional tablet for edema.      gabapentin (NEURONTIN) 300 MG capsule TAKE 1 CAPSULE BY MOUTH TWICE DAILY AS DIRECTED (Patient taking differently: Take 1 capsule (300 mg total) by mouth two (2) times a day.) 180 capsule 3    HUMALOG KWIKPEN INSULIN 100 unit/mL injection pen INJECT 15-25 UNITS WITH MEALS + CORRECTION **MAX 100 UNITS A DAY** 60 mL 3    insulin degludec 200 unit/mL (3 mL) InPn Inject 0.4 mL (80 Units total) under the skin daily. Titrate up to 100units daily as instructed. 45 mL 3    losartan (COZAAR) 25 MG tablet Take 1 tablet (25 mg total) by mouth daily. 90 tablet 3    magnesium chloride (SLOW_MAG) 64 mg TbEC Take 128 mg by mouth three (3) times a day (at 6am, noon and 6pm). (2 tablets) 9am  1 pm and 9 pm      metoprolol succinate (TOPROL-XL) 100 MG 24 hr tablet TAKE ONE TABLET TWICE DAILY 180 tablet 1    multivitamin capsule Take 1 capsule by mouth daily.      MYFORTIC 180 mg EC tablet Take 3 tablets (540 mg total) by mouth two (2) times a day. 540 tablet 3    pravastatin (PRAVACHOL) 20 MG tablet TAKE ONE TABLET BY MOUTH EVERY DAY 90 tablet 3    tacrolimus (PROGRAF) 1 MG capsule Take 3 capsules (3 mg total) by mouth in the morning AND 2 capsules (2 mg total) in the evening. 450 capsule 3    TRULICITY 1.5 mg/0.5 mL PnIj INJECT 1.5MG  (0.5ML) SUBCUTANEOUSLY ONCEA WEEK 2 mL 11    warfarin (JANTOVEN) 2.5 MG tablet TAKE 1 TABLET BY MOUTH ONCE DAILY 30 tablet 11    BD ULTRA-FINE NANO PEN NEEDLE 32 gauge x 5/32 (4 mm) Ndle USE 3 TIMES DAILY BEFORE MEALS 300 each 3    blood-glucose meter kit Use as instructed. 1 each 0    flash glucose sensor (FREESTYLE LIBRE 14 DAY SENSOR) kit by Other route every fourteen (14) days. 2 each 11    miscellaneous medical supply Misc 1 application by Miscellaneous route daily as needed. Lightweight wheelchair. 1 each 0    miscellaneous medical supply Misc 1 application by Miscellaneous route nightly. Recommend CPAP 14 cm H2O with EPR 3 and heated humidifier for nasal dryness, mask: ResMed Activa Lt nasal mask (Large). 1 each 0    warfarin (JANTOVEN) 5 MG tablet TAKE 1 TABLET BY MOUTH DAILY WITH EVENING MEAL. TAKE 6 DAYS A WEEK (TUESDAY THROUGH SUNDAY) AS DIRECTED. 90 tablet 0     No current facility-administered medications for this visit.     Facility-Administered Medications Ordered in Other Visits   Medication Dose Route Frequency Provider Last Rate Last Admin    bevacizumab    PRN (once a day) Melvyn Novas, MD   2.5 mg at 01/28/17 0750       Physical Exam    BP 121/61 (BP Site: R Arm, BP Position: Sitting, BP Cuff Size: Large)  - Pulse 72  - Temp 36.3 ??C (97.3 ??F) (Temporal)  - Ht 185.4 cm (6' 1)  - Wt (!) 111.3 kg (245 lb 6.4 oz)  - BMI 32.38 kg/m??   General: Patient is a pleasant male in no apparent distress.  Eyes: Sclera anicteric.  Neck: Supple without LAD/JVD/bruits.  Lungs: Clear to auscultation bilaterally, no wheezes/rales/rhonchi.  Cardiovascular: Regular rate and rhythm without murmurs, rubs or gallops.  Abdomen: Soft, notender/nondistended. Positive bowel sounds. No hepatosplenomegaly, masses or bruits appreciated.  Extremities:  1+ LE edema  Skin: Without rash  Neurological:  gait unsteady, distal sensory loss in LEs .  Psychiatric: Mood and affect appropriate.    Laboratory Results    Recent Results (from the past 170 hour(s))   Comprehensive Metabolic Panel    Collection Time: 12/28/21  8:14 AM   Result Value Ref Range    Sodium 141 135 - 145 mmol/L    Potassium 4.6 3.4 - 4.8 mmol/L    Chloride 105 98 - 107 mmol/L    CO2 29.3 20.0 - 31.0 mmol/L    Anion Gap 7 5 - 14 mmol/L    BUN 35 (H) 9 - 23 mg/dL    Creatinine 1.61 (H) 0.60 - 1.10 mg/dL    BUN/Creatinine Ratio 24     eGFR CKD-EPI (2021) Male 51 (  L) >=60 mL/min/1.54m2    Glucose 140 70 - 179 mg/dL    Calcium 9.3 8.7 - 16.1 mg/dL    Albumin 3.6 3.4 - 5.0 g/dL    Total Protein 6.7 5.7 - 8.2 g/dL    Total Bilirubin 0.7 0.3 - 1.2 mg/dL    AST 23 <=09 U/L    ALT 17 10 - 49 U/L    Alkaline Phosphatase 97 46 - 116 U/L   Magnesium Level    Collection Time: 12/28/21  8:14 AM   Result Value Ref Range    Magnesium 2.0 1.6 - 2.6 mg/dL   Phosphorus Level    Collection Time: 12/28/21  8:14 AM   Result Value Ref Range    Phosphorus 3.6 2.4 - 5.1 mg/dL   Protein/Creatinine Ratio, Urine    Collection Time: 12/28/21  8:14 AM   Result Value Ref Range    Creat U 133.3 Undefined mg/dL    Protein, Ur 60.4 Undefined mg/dL    Protein/Creatinine Ratio, Urine 0.314 Undefined   Urinalysis with Microscopy    Collection Time: 12/28/21  8:14 AM   Result Value Ref Range    Color, UA Yellow     Clarity, UA Clear     Specific Gravity, UA 1.025 1.005 - 1.030    pH, UA 5.5 5.0 - 9.0    Leukocyte Esterase, UA Negative Negative    Nitrite, UA Negative Negative    Protein, UA 30 mg/dL (A) Negative    Glucose, UA Negative Negative    Ketones, UA Negative Negative    Urobilinogen, UA 0.2 mg/dL 0.2 - 2.0 mg/dL    Bilirubin, UA Negative Negative    Blood, UA Trace (A) Negative    RBC, UA 1 <3 /HPF    WBC, UA <1 <2 /HPF    Squam Epithel, UA <1 0 - 5 /HPF    Bacteria, UA Rare (A) None Seen /HPF    Hyaline Casts, UA 12 (H) 0 - 1 /LPF    Mucus, UA Rare (A) None Seen /HPF   CBC w/ Differential    Collection Time: 12/28/21  8:14 AM   Result Value Ref Range    WBC 6.7 3.6 - 11.2 10*9/L    RBC 5.24 4.26 - 5.60 10*12/L    HGB 14.8 12.9 - 16.5 g/dL    HCT 54.0 98.1 - 19.1 %    MCV 84.6 77.6 - 95.7 fL    MCH 28.2 25.9 - 32.4 pg    MCHC 33.3 32.0 - 36.0 g/dL    RDW 47.8 29.5 - 62.1 %    MPV 7.8 6.8 - 10.7 fL    Platelet 200 150 - 450 10*9/L    nRBC 0 <=4 /100 WBCs    Neutrophils % 71.4 %    Lymphocytes % 19.2 %    Monocytes % 7.6 %    Eosinophils % 1.3 %    Basophils % 0.5 %    Absolute Neutrophils 4.8 1.8 - 7.8 10*9/L    Absolute Lymphocytes 1.3 1.1 - 3.6 10*9/L    Absolute Monocytes 0.5 0.3 - 0.8 10*9/L    Absolute Eosinophils 0.1 0.0 - 0.5 10*9/L    Absolute Basophils 0.0 0.0 - 0.1 10*9/L

## 2021-12-28 NOTE — Unmapped (Signed)
I spent 15 minutes with the patient at his clinic visit. I reviewed and updated his medications. He leaves tomorrow to go on a cruise. We will give him a prophylactic RX for a z pak in case he gets sick on the trip. He took his tacrolimus at 902pm last night. He has a spot behind his right ear that he will need to see dermatology before his next scheduled visit. He will call when he gets back next week.

## 2021-12-29 LAB — BK VIRUS QUANTITATIVE PCR, BLOOD: BK BLOOD RESULT: NOT DETECTED

## 2022-01-01 LAB — VITAMIN D 25 HYDROXY: VITAMIN D, TOTAL (25OH): 31.9 ng/mL (ref 20.0–80.0)

## 2022-01-02 LAB — VITAMIN D 1,25 DIHYDROXY: VITAMIN D 1,25-DIHYDROXY: 25 pg/mL

## 2022-01-05 NOTE — Unmapped (Signed)
Attempt made to reach patient and schedule AWV. Left message to call back.

## 2022-01-08 ENCOUNTER — Ambulatory Visit (INDEPENDENT_AMBULATORY_CARE_PROVIDER_SITE_OTHER): Payer: Medicare Other | Admitting: Dermatology

## 2022-01-08 DIAGNOSIS — L578 Other skin changes due to chronic exposure to nonionizing radiation: Secondary | ICD-10-CM

## 2022-01-08 DIAGNOSIS — L820 Inflamed seborrheic keratosis: Secondary | ICD-10-CM | POA: Diagnosis not present

## 2022-01-08 DIAGNOSIS — L821 Other seborrheic keratosis: Secondary | ICD-10-CM | POA: Diagnosis not present

## 2022-01-08 NOTE — Progress Notes (Signed)
   Follow-Up Visit   Subjective  Larry Bright is a 76 y.o. male who presents for the following: Skin Problem (Patient here today for a spot behind right ear. Patient's hairdresser noticed it a few weeks ago. ).  Patient accompanied by wife who contributes to history.   The following portions of the chart were reviewed this encounter and updated as appropriate:       Review of Systems:  No other skin or systemic complaints except as noted in HPI or Assessment and Plan.  Objective  Well appearing patient in no apparent distress; mood and affect are within normal limits.  A focused examination was performed including face. Relevant physical exam findings are noted in the Assessment and Plan.  left nasal ala x 1, left paranasal x 2 (3) keratotic stuck-on, waxy papules    Assessment & Plan  Inflamed seborrheic keratosis (3) left nasal ala x 1, left paranasal x 2  Symptomatic, irritating, patient would like treated.   Destruction of lesion - left nasal ala x 1, left paranasal x 2  Destruction method: cryotherapy   Informed consent: discussed and consent obtained   Lesion destroyed using liquid nitrogen: Yes   Region frozen until ice ball extended beyond lesion: Yes   Outcome: patient tolerated procedure well with no complications   Post-procedure details: wound care instructions given   Additional details:  Prior to procedure, discussed risks of blister formation, small wound, skin dyspigmentation, or rare scar following cryotherapy. Recommend Vaseline ointment to treated areas while healing.    Seborrheic Keratoses - Stuck-on, waxy, tan-brown papules and/or plaques, including R posterior ear - Benign-appearing - Discussed benign etiology and prognosis. - Observe - Call for any changes  Actinic Damage - chronic, secondary to cumulative UV radiation exposure/sun exposure over time - diffuse scaly erythematous macules with underlying dyspigmentation - Recommend daily  broad spectrum sunscreen SPF 30+ to sun-exposed areas, reapply every 2 hours as needed.  - Recommend staying in the shade or wearing long sleeves, sun glasses (UVA+UVB protection) and wide brim hats (4-inch brim around the entire circumference of the hat). - Call for new or changing lesions.   Return for UBSE, as scheduled.  Graciella Belton, RMA, am acting as scribe for Brendolyn Patty, MD .  Documentation: I have reviewed the above documentation for accuracy and completeness, and I agree with the above.  Brendolyn Patty MD

## 2022-01-08 NOTE — Patient Instructions (Addendum)
Cryotherapy Aftercare  Wash gently with soap and water everyday.   Apply Vaseline and Band-Aid daily until healed.     Due to recent changes in healthcare laws, you may see results of your pathology and/or laboratory studies on MyChart before the doctors have had a chance to review them. We understand that in some cases there may be results that are confusing or concerning to you. Please understand that not all results are received at the same time and often the doctors may need to interpret multiple results in order to provide you with the best plan of care or course of treatment. Therefore, we ask that you please give us 2 business days to thoroughly review all your results before contacting the office for clarification. Should we see a critical lab result, you will be contacted sooner.   If You Need Anything After Your Visit  If you have any questions or concerns for your doctor, please call our main line at [phone] and press option 4 to reach your doctor's medical assistant. If no one answers, please leave a voicemail as directed and we will return your call as soon as possible. Messages left after 4 pm will be answered the following business day.   You may also send us a message via MyChart. We typically respond to MyChart messages within 1-2 business days.  For prescription refills, please ask your pharmacy to contact our office. Our fax number is [phone].  If you have an urgent issue when the clinic is closed that cannot wait until the next business day, you can page your doctor at the number below.    Please note that while we do our best to be available for urgent issues outside of office hours, we are not available 24/7.   If you have an urgent issue and are unable to reach us, you may choose to seek medical care at your doctor's office, retail clinic, urgent care center, or emergency room.  If you have a medical emergency, please immediately call 911 or go to the  emergency department.  Pager Numbers  - Dr. Kowalski: [phone]  - Dr. Moye: [phone]  - Dr. Stewart: [phone]  In the event of inclement weather, please call our main line at [phone] for an update on the status of any delays or closures.  Dermatology Medication Tips: Please keep the boxes that topical medications come in in order to help keep track of the instructions about where and how to use these. Pharmacies typically print the medication instructions only on the boxes and not directly on the medication tubes.   If your medication is too expensive, please contact our office at [phone] option 4 or send us a message through MyChart.   We are unable to tell what your co-pay for medications will be in advance as this is different depending on your insurance coverage. However, we may be able to find a substitute medication at lower cost or fill out paperwork to get insurance to cover a needed medication.   If a prior authorization is required to get your medication covered by your insurance company, please allow us 1-2 business days to complete this process.  Drug prices often vary depending on where the prescription is filled and some pharmacies may offer cheaper prices.  The website www.goodrx.com contains coupons for medications through different pharmacies. The prices here do not account for what the cost may be with help from insurance (it may be cheaper with your insurance), but the website can   give you the price if you did not use any insurance.  - You can print the associated coupon and take it with your prescription to the pharmacy.  - You may also stop by our office during regular business hours and pick up a GoodRx coupon card.  - If you need your prescription sent electronically to a different pharmacy, notify our office through House MyChart or by phone at [phone] option 4.     Si Usted Necesita Algo Despus de Su Visita  Tambin puede  enviarnos un mensaje a travs de MyChart. Por lo general respondemos a los mensajes de MyChart en el transcurso de 1 a 2 das hbiles.  Para renovar recetas, por favor pida a su farmacia que se ponga en contacto con nuestra oficina. Nuestro nmero de fax es el [phone].  Si tiene un asunto urgente cuando la clnica est cerrada y que no puede esperar hasta el siguiente da hbil, puede llamar/localizar a su doctor(a) al nmero que aparece a continuacin.   Por favor, tenga en cuenta que aunque hacemos todo lo posible para estar disponibles para asuntos urgentes fuera del horario de oficina, no estamos disponibles las 24 horas del da, los 7 das de la semana.   Si tiene un problema urgente y no puede comunicarse con nosotros, puede optar por buscar atencin mdica  en el consultorio de su doctor(a), en una clnica privada, en un centro de atencin urgente o en una sala de emergencias.  Si tiene una emergencia mdica, por favor llame inmediatamente al 911 o vaya a la sala de emergencias.  Nmeros de bper  - Dr. Kowalski: [phone]  - Dra. Moye: [phone]  - Dra. Stewart: [phone]  En caso de inclemencias del tiempo, por favor llame a nuestra lnea principal al [phone] para una actualizacin sobre el estado de cualquier retraso o cierre.  Consejos para la medicacin en dermatologa: Por favor, guarde las cajas en las que vienen los medicamentos de uso tpico para ayudarle a seguir las instrucciones sobre dnde y cmo usarlos. Las farmacias generalmente imprimen las instrucciones del medicamento slo en las cajas y no directamente en los tubos del medicamento.   Si su medicamento es muy caro, por favor, pngase en contacto con nuestra oficina llamando al [phone] y presione la opcin 4 o envenos un mensaje a travs de MyChart.   No podemos decirle cul ser su copago por los medicamentos por adelantado ya que esto es diferente dependiendo de la cobertura de su seguro.  Sin embargo, es posible que podamos encontrar un medicamento sustituto a menor costo o llenar un formulario para que el seguro cubra el medicamento que se considera necesario.   Si se requiere una autorizacin previa para que su compaa de seguros cubra su medicamento, por favor permtanos de 1 a 2 das hbiles para completar este proceso.  Los precios de los medicamentos varan con frecuencia dependiendo del lugar de dnde se surte la receta y alguna farmacias pueden ofrecer precios ms baratos.  El sitio web www.goodrx.com tiene cupones para medicamentos de diferentes farmacias. Los precios aqu no tienen en cuenta lo que podra costar con la ayuda del seguro (puede ser ms barato con su seguro), pero el sitio web puede darle el precio si no utiliz ningn seguro.  - Puede imprimir el cupn correspondiente y llevarlo con su receta a la farmacia.  - Tambin puede pasar por nuestra oficina durante el horario de atencin regular y recoger una tarjeta de cupones de GoodRx.  -   Si necesita que su receta se enve electrnicamente a una farmacia diferente, informe a nuestra oficina a travs de MyChart de  o por telfono llamando al [phone] y presione la opcin 4.  

## 2022-01-15 NOTE — Unmapped (Signed)
[facility] Refill Coordination Note    Specialty Medication(s) to be Shipped:   Transplant: Myfortic 180mg  and tacrolimus 1mg     Other medication(s) to be shipped: No additional medications requested for fill at this time     David Ellis, DOB: [birth year]  Phone: [phone] (home)       All above HIPAA information was verified with patient.     Was a Nurse, learning disability used for this call? No    Completed refill call assessment today to schedule patient's medication shipment from the [facility] [phone]).  All relevant notes have been reviewed.     Specialty medication(s) and dose(s) confirmed: Regimen is correct and unchanged.   Changes to medications: Samisoni reports no changes at this time.  Changes to insurance: No  New side effects reported not previously addressed with a pharmacist or physician: None reported  Questions for the pharmacist: No    Confirmed patient received a Conservation officer, historic buildings and a Surveyor, mining with first shipment. The patient will receive a drug information handout for each medication shipped and additional FDA Medication Guides as required.       DISEASE/MEDICATION-SPECIFIC INFORMATION        N/A    SPECIALTY MEDICATION ADHERENCE     Medication Adherence    Patient reported X missed doses in the last month: 0  Specialty Medication: myfortic 180 mg  Patient is on additional specialty medications: Yes  Additional Specialty Medications: Tacrolimus 1 mg   Patient Reported Additional Medication X Missed Doses in the Last Month: 0  Patient is on more than two specialty medications: No      Adherence tools used: patient uses a pill box to manage medications      Support network for adherence: family member                    Were doses missed due to medication being on hold? No    Myfortic 180 mg: 10 days of medicine on hand   tacrolimus 1 mg: 10 days of medicine on hand       REFERRAL TO PHARMACIST     Referral to the pharmacist: Not needed      [facility]     Shipping address confirmed in Epic.     Delivery Scheduled: Yes, Expected medication delivery date: 01/19/22.     Medication will be delivered via Next Day Courier to the prescription address in Epic WAM.    Quintella Reichert   [facility] Specialty Technician

## 2022-01-18 ENCOUNTER — Ambulatory Visit: Admit: 2022-01-18 | Discharge: 2022-01-19 | Payer: MEDICARE | Attending: Internal Medicine | Primary: Internal Medicine

## 2022-01-18 DIAGNOSIS — I4891 Unspecified atrial fibrillation: Principal | ICD-10-CM

## 2022-01-18 DIAGNOSIS — I10 Essential (primary) hypertension: Principal | ICD-10-CM

## 2022-01-18 DIAGNOSIS — R609 Edema, unspecified: Principal | ICD-10-CM

## 2022-01-18 DIAGNOSIS — E785 Hyperlipidemia, unspecified: Principal | ICD-10-CM

## 2022-01-18 MED ORDER — PEG 3350-ELECTROLYTES 236 GRAM-22.74 GRAM-6.74 GRAM-5.86 GRAM SOLUTION
Freq: Once | ORAL | 0 refills | 1 days | Status: CP
Start: 2022-01-18 — End: 2022-01-18

## 2022-01-18 MED FILL — TACROLIMUS 1 MG CAPSULE, IMMEDIATE-RELEASE: ORAL | 30 days supply | Qty: 150 | Fill #9

## 2022-01-18 MED FILL — MYFORTIC 180 MG TABLET,DELAYED RELEASE: ORAL | 30 days supply | Qty: 180 | Fill #2

## 2022-01-18 NOTE — Unmapped (Signed)
Assessment and Plan:   David Ellis is a 76 y.o. 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    1. Atrial fibrillation  Overall doing very well.  Zio patch 02/2019 without recurrent atrial fibrillation.  He is regular on examination today.  Plan to continue metoprolol at current dose.  Previously reviewed anticoagulation options and he has preferred to stay on warfarin.  We again reviewed risks and benefits today in the setting of his recent falls.  After discussing risks and benefits, we will continue warfarin for anticoagulation.  INR managed through Dr. Dorthula Rue office.    2. Hypertension  Blood pressure at goal.  Reviewed labs from last month, creatinine 1.43 and electrolytes are stable.  Continue current regimen.    3. Hyperlipidemia  Reviewed recent lipids 11/2021 with non-HDL less than 100.  Plan to continue pravastatin.  Of note, we are using this in lieu of high intensity statin due to favorable drug drug interactions with transplant regimen and also the fact that has controlled his cholesterol well.     4.  Edema  Symptoms are stable.  Has only used additional dose of furosemide once since last visit.  Reviewed TTE 3/17 with normal ejection fraction.  Plan to continue current regimen.      Vevelyn Francois, MD  [facility] Internal [facility]--Cardiology  Office [phone]  Office [phone]      Subjective:   PCP: [name], MD  Patient: [name]  DOB: 1945/12/24    Reason for visit:  Afib, HTN, RV failure  HPI: David Ellis is a 76 y.o. 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  Since his last visit, he has had a complicated noncardiac course.  He suffered a fall 07/28/2021 and fractured the head of his humerus.  He had a prolonged recovery involving a sling and physical therapy.  He also sustained [place] airport.  He has some bruising and soft tissue swelling that has been slowly improving.  From the cardiac standpoint, he has been doing well.  He denies any chest pain, shortness of breath, atrial fibrillation, or near syncope.  He had been walking daily until recent fall at the airport.  Swelling has been stable.  He only reports using 1 extra dose of Lasix since last visit.  He does report occasional dizziness associated with new insulin regimen.    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 Wedge defects in left lung without any thrombus visualized, could represent chronic changes or small subsegmental PEs.    Risk Factors: HTN, DM     Cath / PCI:   None    CV Surgery:   None    EP Procedures and Devices:   None    Non-Invasive Evaluation(s):   Ziopatch 9/20: Predominantly sinus  rhythm with rates 57 to 100 bpm, average heart rate 75 bpm.  71 episodes of SVT, longest lasting 17 beats at 91 bpm.  Rare PVCs, brief idioventricular rhythm noted.  No wide-complex tachycardia 2.5% PVC burden.  TTE 3/17: Normal EF, LVH, grade 1 diastolic dysfunction  Ziopatch 2/16: Rare SVE and VE. 29 episodes of SVT, longest episode lasting 20 beats. No atrial fibrillation identified.  TTE from 06/30/13: Normal EF 55-60%, mildly dilated RV, mild RV dysfunction.  Nuclear stress test 07/2011:  Normal myocardial perfusion study  No evidence for significant ischemia or scar EF 62% and RV is mildly dilated.  Minimal coronary calcification is noted involving the LAD and RCA    ______________________________________________________________________    Other past medical history, social history, family history, medications, allergies and problem list reviewed in the medical record.    Current cardiac medications include:   Metoprolol 100 mg twice daily  ASA 81 mg as needed for travel due to VTE history  Furosemide 40 mg once daily with additional dose as needed  Warfarin  Pravastatin 20 mg daily  Losartan 25 mg daily  Jardiance      Objective:     BP 128/70 (BP Site: L Arm, BP Position: Sitting, BP Cuff Size: Large)  - Pulse 81  - SpO2 98%     PHYSICAL EXAMINATION:   GENERAL:  Alert, NAD  CARDIOVASCULAR:  Regular rate and rhythm, soft S1/S2, no murmurs, rubs, or gallops.  1+ b/l LE lower extremity edema.  RESPIRATORY:  Clear to auscultation bilaterally.  No wheezes, crackles, or rhonchi. Normal work of breathing.    ______________________________________________________________________  EKG today shows normal sinus rhythm, left axis deviation, first-degree AV block, poor wave progression    Recent CV pertinent labs:  Lab Results   Component Value Date    LDL Calculated 68 11/28/2021    LDL Calculated 74 04/21/2015    Non-HDL Cholesterol 95 11/28/2021    HDL 36 (L) 11/28/2021    HDL 36 (L) 04/21/2015    INR, POC 3.09 (A) 05/19/2019    INR 1.96 11/28/2021    INR 1.9 (A) 06/02/2019    PRO-BNP 375 (H) 06/29/2014    Creatinine 1.43 (H) 12/28/2021    Creatinine 1.7 (H) 07/09/2017    Creatinine 1.36 08/09/2014    Potassium 4.6 12/28/2021    Potassium 4.6 07/09/2017    BUN 35 (H) 12/28/2021    BUN 49.00 (H) 07/09/2017    TSH 2.202 07/02/2017    TSH 1.20 07/02/2011       Chemistry        Component Value Date/Time    NA 141 12/28/2021 0814    NA 139 07/09/2017 1358    K 4.6 12/28/2021 0814    K 4.6 07/09/2017 1358    CL 105 12/28/2021 0814    CL 104 07/09/2017 1358    CO2 29.3 12/28/2021 0814    CO2 24.2 07/09/2017 1358    BUN 35 (H) 12/28/2021 0814    BUN 49.00 (H) 07/09/2017 1358    CREATININE 1.43 (H) 12/28/2021 0814    CREATININE 1.7 (H) 07/09/2017 1358    GLU 140 12/28/2021 0814        Component Value Date/Time    CALCIUM 9.3 12/28/2021 0814    CALCIUM 8.9 07/09/2017 1358    ALKPHOS 97 12/28/2021 0814    ALKPHOS 91 04/21/2015 0858    AST 23 12/28/2021 0814    AST 24 04/21/2015 0858    ALT 17 12/28/2021  0814    ALT 22 04/21/2015 0858    BILITOT 0.7 12/28/2021 0814    BILITOT 0.5 04/21/2015 1610

## 2022-01-23 NOTE — Unmapped (Signed)
Abstraction Result Flowsheet Data    This patient's last AWV date: [facility] Last Medicare Wellness Visit Date: 07/19/2020  This patients last WCC/CPE date: : Not Found      Reason for Encounter  Reason for Encounter: Outreach  Primary Reason for Outreach: AWV  Text Message: No  MyChart Message: No  Outreach Call Outcome: Left message

## 2022-01-29 NOTE — Unmapped (Signed)
Pt's wife left a voicemail stating that pt has a colonoscopy scheduled for 02/27/22 and they need to know how to adjust pt's insulin for the procedure.    They are asking if pt needs an appt with Dr. Tiburcio Pea to discuss (currently has a f/up scheduled for 04/2022) or if she can advise them via mychart/over the phone.    Dr. Tiburcio Pea,   Please advise.   Thanks!

## 2022-01-30 NOTE — Unmapped (Signed)
Mailed pre procedure anti-coag instructions (hard copy) to patient. See letters tab for details.

## 2022-02-01 ENCOUNTER — Ambulatory Visit: Admit: 2022-02-01 | Discharge: 2022-02-01 | Payer: MEDICARE

## 2022-02-01 DIAGNOSIS — Z7901 Long term (current) use of anticoagulants: Principal | ICD-10-CM

## 2022-02-01 DIAGNOSIS — I4891 Unspecified atrial fibrillation: Principal | ICD-10-CM

## 2022-02-01 LAB — CBC W/ AUTO DIFF
BASOPHILS ABSOLUTE COUNT: 0 10*9/L (ref 0.0–0.1)
BASOPHILS RELATIVE PERCENT: 0.5 %
EOSINOPHILS ABSOLUTE COUNT: 0.1 10*9/L (ref 0.0–0.5)
EOSINOPHILS RELATIVE PERCENT: 1 %
HEMATOCRIT: 44.2 % (ref 39.0–48.0)
HEMOGLOBIN: 15 g/dL (ref 12.9–16.5)
LYMPHOCYTES ABSOLUTE COUNT: 1.1 10*9/L (ref 1.1–3.6)
LYMPHOCYTES RELATIVE PERCENT: 13.5 %
MEAN CORPUSCULAR HEMOGLOBIN CONC: 33.9 g/dL (ref 32.0–36.0)
MEAN CORPUSCULAR HEMOGLOBIN: 28.4 pg (ref 25.9–32.4)
MEAN CORPUSCULAR VOLUME: 83.6 fL (ref 77.6–95.7)
MEAN PLATELET VOLUME: 7.6 fL (ref 6.8–10.7)
MONOCYTES ABSOLUTE COUNT: 0.5 10*9/L (ref 0.3–0.8)
MONOCYTES RELATIVE PERCENT: 6 %
NEUTROPHILS ABSOLUTE COUNT: 6.5 10*9/L (ref 1.8–7.8)
NEUTROPHILS RELATIVE PERCENT: 79 %
PLATELET COUNT: 225 10*9/L (ref 150–450)
RED BLOOD CELL COUNT: 5.29 10*12/L (ref 4.26–5.60)
RED CELL DISTRIBUTION WIDTH: 15 % (ref 12.2–15.2)
WBC ADJUSTED: 8.3 10*9/L (ref 3.6–11.2)

## 2022-02-01 LAB — BASIC METABOLIC PANEL
ANION GAP: 7 mmol/L (ref 5–14)
BLOOD UREA NITROGEN: 23 mg/dL (ref 9–23)
BUN / CREAT RATIO: 19
CALCIUM: 9 mg/dL (ref 8.7–10.4)
CHLORIDE: 106 mmol/L (ref 98–107)
CO2: 29 mmol/L (ref 20.0–31.0)
CREATININE: 1.22 mg/dL — ABNORMAL HIGH
EGFR CKD-EPI (2021) MALE: 62 mL/min/{1.73_m2} (ref >=60)
GLUCOSE RANDOM: 133 mg/dL (ref 70–179)
POTASSIUM: 4.8 mmol/L (ref 3.4–4.8)
SODIUM: 142 mmol/L (ref 135–145)

## 2022-02-01 LAB — PROTIME-INR
INR: 2.55
PROTIME: 30 s — ABNORMAL HIGH (ref 9.8–12.8)

## 2022-02-01 LAB — MAGNESIUM: MAGNESIUM: 1.9 mg/dL (ref 1.6–2.6)

## 2022-02-01 LAB — TACROLIMUS LEVEL: TACROLIMUS BLOOD: 4.5 ng/mL

## 2022-02-01 LAB — PHOSPHORUS: PHOSPHORUS: 3.3 mg/dL (ref 2.4–5.1)

## 2022-02-01 NOTE — Unmapped (Signed)
ORTHOPAEDIC NOTE     Juliahna Wiswell L. 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     Follow up: 6 weeks with nonop sports med MD for large-volume injection, 4 months with myself       Chief Complaint:     Recheck right shoulder     SUBJECTIVE:     HPI: David Ellis is a RHD 76 y.o. 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  He states he has been discharged from therapy.  He has pain when he is sitting at the computer for long periods of time and when he tries to raise himself up from his chair.       Allergies  Allergies   Allergen Reactions    Egg Derived Rash    Enalapril Cough    Epinephrine Palpitations    Grass Pollen-[place], Standard Itching    Levofloxacin Other (See Comments)     Per patient    Lisinopril Rash    Mepivacaine Hcl Palpitations    Penicillins Other (See Comments)     As child  Tolerated amp/sulbactam without side effects during 10/2014 hospitalization     Past Medical History  Past Medical History:   Diagnosis Date    Arthritis     Atrial flutter (CMS-HCC)     Cataract     Diabetes mellitus (CMS-HCC) Diabetic nephropathy (CMS-HCC)     Diabetic retinopathy (CMS-HCC)     Fractures     Ganglion cyst     Hand injury     Heart disease     Hyperlipidemia     Hypertension     Impaired mobility     Joint pain     Lack of access to transportation     Osteomyelitis (CMS-HCC) 11/2014    Pulmonary embolism (CMS-HCC) 05/2014    Retinopathy due to secondary diabetes (CMS-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/2013    Squamous cell skin cancer 11/2013    Stroke (CMS-HCC)     Tear of meniscus of knee     Transplanted kidney 08/21/2011        PHYSICAL EXAM:     MSK: Right shoulder  Inspection: No edema, no erythema, skin intact  Palpation: No tenderness when I palpate along the shoulder  ROM: Forward flexion 90, abduction 70, external rotation 20  Strength: 4/5 strength in all planes range of motion of the shoulder  normal sensation RUE  radial pulses easily palpable     Imaging   Four views of the right Shoulder independently reviewed and  interpreted by myself show unchanged alignment of comminuted impacted proximal humerus fracture with increasing callus summation. No other obvious fractures, lucencies, dislocations, or acute abnormalities.    MEDICAL DECISION MAKING (level of service defined by 2/3 elements)     Number/Complexity of Problems Addressed 1 acute, uncomplicated illness or injury (99203/99213)   Amount/Complexity of Data to be Reviewed/Analyzed Independent interpretation of a test performed by another physician/other qualified health care professional (99204/99214)   Risk of Complications/Morbidity/Mortality of Management Physical Therapy/Occupational Therapy (99203/99213)   DME ORDER:  Dx:  ,                   cc:  [name], MD  *Patient note was created using Dragon Dictation sotware. Errors in syntax or grammar may not have been identified and edited on initial review.  '

## 2022-02-02 MED FILL — PEG 3350-ELECTROLYTES 236 GRAM-22.74 GRAM-6.74 GRAM-5.86 GRAM SOLUTION: ORAL | 1 days supply | Qty: 4000 | Fill #0

## 2022-02-08 NOTE — Unmapped (Signed)
Abstraction Result Flowsheet Data    This patient's last AWV date: [facility] Last Medicare Wellness Visit Date: 07/19/2020  This patients last WCC/CPE date: : Not Found      Reason for Encounter  Reason for Encounter: Outreach  Primary Reason for Outreach: AWV  Text Message: No  MyChart Message: No  Outreach Call Outcome: Declined (call pt declined , sees his PCP on regular)

## 2022-02-13 ENCOUNTER — Ambulatory Visit: Admit: 2022-02-13 | Discharge: 2022-02-14 | Payer: MEDICARE

## 2022-02-13 DIAGNOSIS — E113593 Type 2 diabetes mellitus with proliferative diabetic retinopathy without macular edema, bilateral: Principal | ICD-10-CM

## 2022-02-13 DIAGNOSIS — H04123 Dry eye syndrome of bilateral lacrimal glands: Principal | ICD-10-CM

## 2022-02-13 DIAGNOSIS — H35373 Puckering of macula, bilateral: Principal | ICD-10-CM

## 2022-02-13 NOTE — Unmapped (Signed)
[facility] Clinical Assessment & Refill Coordination Note    David Ellis, DOB: 21-Jun-1945  Phone: [phone] (home)     All above HIPAA information was verified with patient and wife both     Was a translator used for this call? No    Specialty Medication(s):   Transplant: Myfortic 180mg  and tacrolimus 1mg      Current Outpatient Medications   Medication Sig Dispense Refill   ??? acetaminophen (TYLENOL) 325 MG tablet Take by mouth.     ??? aspirin (ECOTRIN) 81 MG tablet Take 1 tablet (81 mg total) by mouth daily as needed (when flying).     ??? BD ULTRA-FINE NANO PEN NEEDLE 32 gauge x 5/32 (4 mm) Ndle USE 3 TIMES DAILY BEFORE MEALS 300 each 3   ??? blood-glucose meter kit Use as instructed. 1 each 0   ??? cholecalciferol, vitamin D3-50 mcg, 2,000 unit,, 50 mcg (2,000 unit) tablet Take 1 tablet (50 mcg total) by mouth daily.     ??? empagliflozin (JARDIANCE) 10 mg tablet Take 1 tablet (10 mg total) by mouth every morning. 90 tablet 3   ??? flash glucose sensor (FREESTYLE LIBRE 14 DAY SENSOR) kit by Other route every fourteen (14) days. 2 each 11   ??? furosemide (LASIX) 40 MG tablet Take 1 tablet (40 mg total) by mouth daily. 40 mg daily. And 1 additional tablet for edema.     ??? gabapentin (NEURONTIN) 300 MG capsule TAKE 1 CAPSULE BY MOUTH TWICE DAILY AS DIRECTED (Patient taking differently: Take 1 capsule (300 mg total) by mouth two (2) times a day.) 180 capsule 3   ??? HUMALOG KWIKPEN INSULIN 100 unit/mL injection pen INJECT 15-25 UNITS WITH MEALS + CORRECTION **MAX 100 UNITS A DAY** 60 mL 3   ??? insulin degludec 200 unit/mL (3 mL) InPn Inject 0.4 mL (80 Units total) under the skin daily. Titrate up to 100units daily as instructed. 45 mL 3   ??? losartan (COZAAR) 25 MG tablet Take 1 tablet (25 mg total) by mouth daily. 90 tablet 3   ??? magnesium chloride (SLOW_MAG) 64 mg TbEC Take 128 mg by mouth three (3) times a day (at 6am, noon and 6pm). (2 tablets) 9am 1 pm and 9 pm     ??? metoprolol succinate (TOPROL-XL) 100 MG 24 hr tablet TAKE ONE TABLET TWICE DAILY 180 tablet 1   ??? miscellaneous medical supply Misc 1 application by Miscellaneous route daily as needed. Lightweight wheelchair. 1 each 0   ??? miscellaneous medical supply Misc 1 application by Miscellaneous route nightly. Recommend CPAP 14 cm H2O with EPR 3 and heated humidifier for nasal dryness, mask: ResMed Activa Lt nasal mask (Large). 1 each 0   ??? multivitamin capsule Take 1 capsule by mouth daily.     ??? MYFORTIC 180 mg EC tablet Take 3 tablets (540 mg total) by mouth two (2) times a day. 540 tablet 3   ??? pravastatin (PRAVACHOL) 20 MG tablet TAKE ONE TABLET BY MOUTH EVERY DAY 90 tablet 3   ??? tacrolimus (PROGRAF) 1 MG capsule Take 3 capsules (3 mg total) by mouth in the morning AND 2 capsules (2 mg total) in the evening. 450 capsule 3   ??? TRULICITY 1.5 mg/0.5 mL PnIj INJECT 1.5MG  (0.5ML) SUBCUTANEOUSLY ONCEA WEEK 2 mL 11   ??? warfarin (JANTOVEN) 2.5 MG tablet TAKE 1 TABLET BY MOUTH ONCE DAILY 30 tablet 11   ??? warfarin (JANTOVEN) 5 MG tablet TAKE 1 TABLET BY MOUTH  DAILY WITH EVENING MEAL. TAKE 6 DAYS A WEEK (TUESDAY THROUGH SUNDAY) AS DIRECTED. 90 tablet 0     No current facility-administered medications for this visit.     Facility-Administered Medications Ordered in Other Visits   Medication Dose Route Frequency Provider Last Rate Last Admin   ??? bevacizumab    PRN (once a day) Melvyn Novas, MD   2.5 mg at 01/28/17 0750        Changes to medications: Syeed reports no changes at this time.    Allergies   Allergen Reactions   ??? Egg Derived Rash   ??? Enalapril Cough   ??? Epinephrine Palpitations   ??? Grass Pollen-[place], Standard Itching   ??? Levofloxacin Other (See Comments)     Per patient   ??? Lisinopril Rash   ??? Mepivacaine Hcl Palpitations   ??? Penicillins Other (See Comments)     As child  Tolerated amp/sulbactam without side effects during 10/2014 hospitalization       Changes to allergies: No    SPECIALTY MEDICATION ADHERENCE     Myfortic 180mg   : 10 days of medicine on hand   Tacrolimus 1mg   : 10 days of medicine on hand       Medication Adherence    Patient reported X missed doses in the last month: 0  Specialty Medication: myfortic 180mg   Patient is on additional specialty medications: Yes  Additional Specialty Medications: Tacrolimus 1mg   Patient Reported Additional Medication X Missed Doses in the Last Month: 0      Adherence tools used: patient uses a pill box to manage medications      Support network for adherence: family member              Specialty medication(s) dose(s) confirmed: Regimen is correct and unchanged.     Are there any concerns with adherence? No    Adherence counseling provided? Not needed    CLINICAL MANAGEMENT AND INTERVENTION      Clinical Benefit Assessment:    Do you feel the medicine is effective or helping your condition? Yes    Clinical Benefit counseling provided? Not needed    Adverse Effects Assessment:    Are you experiencing any side effects? No    Are you experiencing difficulty administering your medicine? No    Quality of Life Assessment:    How many days over the past month did your transplant  keep you from your normal activities? For example, brushing your teeth or getting up in the morning. 0    Have you discussed this with your provider? Not needed    Acute Infection Status:    Acute infections noted within Epic:  MDR Acinetobacter, MRSA  Patient reported infection: None    Therapy Appropriateness:    Is therapy appropriate and patient progressing towards therapeutic goals? Yes, therapy is appropriate and should be continued    DISEASE/MEDICATION-SPECIFIC INFORMATION      N/A    PATIENT SPECIFIC NEEDS     - Does the patient have any physical, cognitive, or cultural barriers? No    - Is the patient high risk? Yes, patient is taking a REMS drug. Medication is dispensed in compliance with REMS program    - Does the patient require a Care Management Plan? No     SOCIAL DETERMINANTS OF HEALTH     At the [facility], we have learned that life circumstances - like trouble affording food, housing, utilities, or transportation can affect the health of  many of our patients.   That is why we wanted to ask: are you currently experiencing any life circumstances that are negatively impacting your health and/or quality of life? Patient declined to answer    Social Determinants of Health     Financial Resource Strain: Low Risk  (07/19/2020)    Overall Financial Resource Strain (CARDIA)    ??? Difficulty of Paying Living Expenses: Not very hard   Internet Connectivity: Not on file   Food Insecurity: No Food Insecurity (07/19/2020)    Hunger Vital Sign    ??? Worried About Running Out of Food in the Last Year: Never true    ??? Ran Out of Food in the Last Year: Never true   Tobacco Use: Low Risk  (02/01/2022)    Patient History    ??? Smoking Tobacco Use: Never    ??? Smokeless Tobacco Use: Never    ??? Passive Exposure: Never   Housing/Utilities: Low Risk  (07/19/2020)    Housing/Utilities    ??? Within the past 12 months, have you ever stayed: outside, in a car, in a tent, in an overnight shelter, or temporarily in someone else's home (i.e. couch-surfing)?: No    ??? Are you worried about losing your housing?: No    ??? Within the past 12 months, have you been unable to get utilities (heat, electricity) when it was really needed?: No   Alcohol Use: Not At Risk (07/19/2020)    Alcohol Use    ??? How often do you have a drink containing alcohol?: Never    ??? How many drinks containing alcohol do you have on a typical day when you are drinking?: 1 - 2    ??? How often do you have 5 or more drinks on one occasion?: Never   Transportation Needs: No Transportation Needs (07/19/2020)    PRAPARE - Transportation    ??? Lack of Transportation (Medical): No    ??? Lack of Transportation (Non-Medical): No   Substance Use: Low Risk  (07/19/2020)    Substance Use    ??? Taken prescription drugs for non-medical reasons: Never    ??? Taken illegal drugs: Never    ??? Patient indicated they have taken drugs in the past year for non-medical reasons: Yes, [positive answer(s)]: Not on file   Health Literacy: Low Risk  (07/19/2020)    Health Literacy    ??? : Never   Physical Activity: Sufficiently Active (07/19/2020)    Exercise Vital Sign    ??? Days of Exercise per Week: 5 days    ??? Minutes of Exercise per Session: 30 min   Interpersonal Safety: Not on file   Stress: No Stress Concern Present (07/19/2020)    [employer] of Occupational Health - Occupational Stress Questionnaire    ??? Feeling of Stress : Only a little   Intimate Partner Violence: Not At Risk (07/19/2020)    Humiliation, Afraid, Rape, and Kick questionnaire    ??? Fear of Current or Ex-Partner: No    ??? Emotionally Abused: No    ??? Physically Abused: No    ??? Sexually Abused: No   Depression: At risk (08/21/2021)    PHQ-2    ??? PHQ-2 Score: 4   Social Connections: Moderately Integrated (07/19/2020)    Social Connection and Isolation Panel [NHANES]    ??? Frequency of Communication with Friends and Family: More than three times a week    ??? Frequency of Social Gatherings with Friends and Family: Once a week    ???  Attends Religious Services: 1 to 4 times per year    ??? Active Member of Clubs or Organizations: No    ??? Attends Banker Meetings: Never    ??? Marital Status: Married       Would you be willing to receive help with any of the needs that you have identified today? Not applicable       SHIPPING     Specialty Medication(s) to be Shipped:   Transplant: Myfortic 180mg  and tacrolimus 1mg     Other medication(s) to be shipped: No additional medications requested for fill at this time     Changes to insurance: No    Delivery Scheduled: Yes, Expected medication delivery date: 02/20/2022.     Medication will be delivered via Next Day Courier to the confirmed prescription address in [facility].    The patient will receive a drug information handout for each medication shipped and additional FDA Medication Guides as required.  Verified that patient has previously received a Conservation officer, historic buildings and a Surveyor, mining.    The patient or caregiver noted above participated in the development of this care plan and knows that they can request review of or adjustments to the care plan at any time.      All of the patient's questions and concerns have been addressed.    [name]   [facility] Specialty Pharmacist

## 2022-02-13 NOTE — Unmapped (Unsigned)
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  I reviewed the resident???s note.  I agree with the resident???s findings and plan including interpretation of images.     EXTENDED OPHTHALMOSCOPY RESULTS:  OD: MAs, PRP 360  OS: MAs, PRP 360

## 2022-02-14 ENCOUNTER — Ambulatory Visit: Admit: 2022-02-14 | Discharge: 2022-02-15 | Payer: MEDICARE

## 2022-02-14 DIAGNOSIS — Z1211 Encounter for screening for malignant neoplasm of colon: Principal | ICD-10-CM

## 2022-02-14 DIAGNOSIS — Z86010 Personal history of colonic polyps: Principal | ICD-10-CM

## 2022-02-14 MED ORDER — ENOXAPARIN 100 MG/ML SUBCUTANEOUS SYRINGE
Freq: Two times a day (BID) | SUBCUTANEOUS | 1 refills | 10 days | Status: CP
Start: 2022-02-14 — End: ?

## 2022-02-14 NOTE — Unmapped (Addendum)
Planning on colonoscopy in 13 days.    Issues   1) colonoscopy prep.  This is already set up by GI labs and should be fine.  3 days dietary stepdown, and clearnace for the last 24 hours.    When not eating foods, it is important continue to ingest calories.  When not eating foods, would drink ginger ale or sprite with sugar).    2) Diabetes:  Currently taking jardiance 10 mg daily and tresiba insulin 70 units daily.    MORNING OF COLONOSCOPY (Nothing to eat) Hold jardiance the morning of the colonoscopy.  Decrease tresiba insulin to 35 units the morning of the colonoscopy.  No humalog insulin that morning.    DAY PRIOR TO COLONOSCOPY:  Since your calorie intake will be less, I would hold the Jardiance.  .  Check sugars 4 times a day to make sure no low sugars.  Goal on those three days is between 80-200.   3).  Anti coagulation.  Remaisn on coumadin for afib and LARGE PE.  This is high risk.  I would recommend we bridge with coumadin to lovenox.   5 days prior to colonoscopy, stop coumadin.   2 days prior to colonoscopy, start lovenox 100 mg subcutaneous twice a day.  HOLD LOVENOX the morning of colonoscopy.    The evening of the colonoscopy restart lovenox (same dose) (if they say it's ok, meaning that there wasn't a large risk of bleeding).  Contniue lovenox twice a day for up to 5 days until INR is 2-3.    The evening of the colonoscopy, restart coumadin prior dose.  Check INR 4 days.

## 2022-02-14 NOTE — Unmapped (Signed)
Patient ID: David Ellis is a 76 y.o. male who presents for eval of medical issues.      Assessment/Plan:        History of colonic polyps  Planning on colonoscopy in 13 days.    Issues   1) colonoscopy prep.  This is already set up by GI labs and should be fine.  3 days dietary stepdown, and clearnace for the last 24 hours.    When not eating foods, it is important continue to ingest calories.  When not eating foods, would drink ginger ale or sprite with sugar).    2) Diabetes:  Currently taking jardiance 10 mg daily and tresiba insulin 70 units daily.    MORNING OF COLONOSCOPY (Nothing to eat) Hold jardiance the morning of the colonoscopy.  Decrease tresiba insulin to 35 units the morning of the colonoscopy.  No humalog insulin that morning.    DAY PRIOR TO COLONOSCOPY:  Since your calorie intake will be less, I would hold the Jardiance.  .  Check sugars 4 times a day to make sure no low sugars.  Goal on those three days is between 80-200.   3).  Anti coagulation.  Remaisn on coumadin for afib and LARGE PE.  This is high risk.  I would recommend we bridge with coumadin to lovenox.   5 days prior to colonoscopy, stop coumadin.   2 days prior to colonoscopy, start lovenox 100 mg subcutaneous twice a day.  HOLD LOVENOX the morning of colonoscopy.    The evening of the colonoscopy restart lovenox (same dose) (if they say it's ok, meaning that there wasn't a large risk of bleeding).  Contniue lovenox twice a day for up to 5 days until INR is 2-3.    The evening of the colonoscopy, restart coumadin prior dose.  Check INR 4 days.      Orders Placed This Encounter   Procedures    INFLUENZA QUAD ADJUVANTED 32YR UP(FLUAD)       Medication adherence and barriers to the treatment plan have been addressed. Opportunities to optimize healthy behaviors have been discussed. Patient / caregiver voiced understanding.       Return in about 2 months (around 04/16/2022).     Subjective:     Current Health Status  Patient Active Problem List    Diagnosis Date Noted    Diabetic ulcer of left foot associated with type 2 diabetes mellitus (CMS-HCC) 07/03/2014    OSA on CPAP 02/17/2014    Routine general medical examination at a health care facility 11/17/2013    Atrial fibrillation (CMS-HCC) 07/06/2013    Right ventricular dysfunction 06/30/2013    Other pulmonary embolism without acute cor pulmonale (CMS-HCC) 05/23/2013    Transplanted kidney     Mixed hyperlipidemia 10/30/2011    Type II diabetes mellitus with renal manifestations, uncontrolled 03/26/2011    Essential (primary) hypertension 10/10/2010    Encounter for screening colonoscopy 02/14/2022    Non-healing open wound of toe 07/27/2021    At high risk for falls 07/27/2021    Syncope 02/03/2019    Immunosuppressed status (CMS-HCC) 09/02/2018    Class 2 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 01/01/2018    Hypoglycemia associated with type 2 diabetes mellitus (CMS-HCC) 09/03/2016    BCC (basal cell carcinoma), face 07/18/2015    Left knee pain 11/09/2014    Toe amputation status 08/10/2014    Acquired absence of other toe(s), unspecified side (CMS-HCC) 08/10/2014    Type 2 diabetes mellitus with foot ulcer (  CODE) (CMS-HCC) 07/03/2014    Diabetic polyneuropathy associated with type 2 diabetes mellitus (CMS-HCC) 03/15/2014    Aftercare following organ transplant 08/27/2013    Edema 06/30/2013    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    He is here today for anticipated colonoscopy on Sep 26.    Routine colonoscopy, but MAY need biopsies.      He remains on coumadin for afib and large PE.  Remaisn on tresiba insulin and also jardiance and humalog for DM.    No other complaints.    Allergies   Allergen Reactions    Egg Derived Rash    Enalapril Cough Epinephrine Palpitations    Grass Pollen-[place], Standard Itching    Levofloxacin Other (See Comments)     Per patient    Lisinopril Rash    Mepivacaine Hcl Palpitations    Penicillins Other (See Comments)     As child  Tolerated amp/sulbactam without side effects during 10/2014 hospitalization       Current Outpatient Medications   Medication Sig Dispense Refill    acetaminophen (TYLENOL) 325 MG tablet Take by mouth.      aspirin (ECOTRIN) 81 MG tablet Take 1 tablet (81 mg total) by mouth daily as needed (when flying).      BD ULTRA-FINE NANO PEN NEEDLE 32 gauge x 5/32 (4 mm) Ndle USE 3 TIMES DAILY BEFORE MEALS 300 each 3    blood-glucose meter kit Use as instructed. 1 each 0    cholecalciferol, vitamin D3-50 mcg, 2,000 unit,, 50 mcg (2,000 unit) tablet Take 1 tablet (50 mcg total) by mouth daily.      empagliflozin (JARDIANCE) 10 mg tablet Take 1 tablet (10 mg total) by mouth every morning. 90 tablet 3    flash glucose sensor (FREESTYLE LIBRE 14 DAY SENSOR) kit by Other route every fourteen (14) days. 2 each 11    furosemide (LASIX) 40 MG tablet Take 1 tablet (40 mg total) by mouth daily. 40 mg daily. And 1 additional tablet for edema.      gabapentin (NEURONTIN) 300 MG capsule TAKE 1 CAPSULE BY MOUTH TWICE DAILY AS DIRECTED (Patient taking differently: Take 1 capsule (300 mg total) by mouth two (2) times a day.) 180 capsule 3    HUMALOG KWIKPEN INSULIN 100 unit/mL injection pen INJECT 15-25 UNITS WITH MEALS + CORRECTION **MAX 100 UNITS A DAY** 60 mL 3    insulin degludec 200 unit/mL (3 mL) InPn Inject 0.4 mL (80 Units total) under the skin daily. Titrate up to 100units daily as instructed. 45 mL 3    losartan (COZAAR) 25 MG tablet Take 1 tablet (25 mg total) by mouth daily. 90 tablet 3    magnesium chloride (SLOW_MAG) 64 mg TbEC Take 128 mg by mouth three (3) times a day (at 6am, noon and 6pm). (2 tablets) 9am 1 pm and 9 pm      metoprolol succinate (TOPROL-XL) 100 MG 24 hr tablet TAKE ONE TABLET TWICE DAILY 180 tablet 1    miscellaneous medical supply Misc 1 application by Miscellaneous route daily as needed. Lightweight wheelchair. 1 each 0    miscellaneous medical supply Misc 1 application by Miscellaneous route nightly. Recommend CPAP 14 cm  H2O with EPR 3 and heated humidifier for nasal dryness, mask: ResMed Activa Lt nasal mask (Large). 1 each 0    multivitamin capsule Take 1 capsule by mouth daily.      MYFORTIC 180 mg EC tablet Take 3 tablets (540 mg total) by mouth two (2) times a day. 540 tablet 3    pravastatin (PRAVACHOL) 20 MG tablet TAKE ONE TABLET BY MOUTH EVERY DAY 90 tablet 3    tacrolimus (PROGRAF) 1 MG capsule Take 3 capsules (3 mg total) by mouth in the morning AND 2 capsules (2 mg total) in the evening. 450 capsule 3    TRULICITY 1.5 mg/0.5 mL PnIj INJECT 1.5MG  (0.5ML) SUBCUTANEOUSLY ONCEA WEEK 2 mL 11    warfarin (JANTOVEN) 2.5 MG tablet TAKE 1 TABLET BY MOUTH ONCE DAILY 30 tablet 11    warfarin (JANTOVEN) 5 MG tablet TAKE 1 TABLET BY MOUTH DAILY WITH EVENING MEAL. TAKE 6 DAYS A WEEK (TUESDAY THROUGH SUNDAY) AS DIRECTED. 90 tablet 0    enoxaparin (LOVENOX) 100 mg/mL injection Inject 1 mL (100 mg total) under the skin Two (2) times a day. 20 mL 1     No current facility-administered medications for this visit.     Facility-Administered Medications Ordered in Other Visits   Medication Dose Route Frequency Provider Last Rate Last Admin    bevacizumab    PRN (once a day) Melvyn Novas, MD   2.5 mg at 01/28/17 0750       Past Medical History:   Diagnosis Date    Arthritis     Atrial flutter (CMS-HCC)     Cataract     Diabetes mellitus (CMS-HCC)     Diabetic nephropathy (CMS-HCC)     Diabetic retinopathy (CMS-HCC)     Fractures     Ganglion cyst     Hand injury     Heart disease     Hyperlipidemia     Hypertension     Impaired mobility     Joint pain     Lack of access to transportation     Osteomyelitis (CMS-HCC) 11/2014    Pulmonary embolism (CMS-HCC) 05/2014    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    PR AMPUTATION METATARSAL+TOE,SINGLE Left 07/06/2014    Procedure: AMPUTATION, METATARSAL, WITH TOE SINGLE;  Surgeon: Marion Downer, MD;  Location: MAIN [facility];  Service: Vascular    PR AMPUTATION METATARSAL+TOE,SINGLE Left 10/28/2014    Procedure: AMPUTATION, METATARSAL, WITH TOE SINGLE;  Surgeon: Maple Mirza, MD;  Location: MAIN [facility];  Service: Vascular    PR VITRECTOMY,PANRETINAL LASER RX Right 11/26/2016    Procedure: VITRECTOMY, MECHANICAL, PARS PLANA APPROACH; WITH ENDOLASER PANRETINAL PHOTOCOAGULATION;  Surgeon: Melvyn Novas, MD;  Location: [facility] OR [facility];  Service: Ophthalmology    PR Hessie Knows LASER RX Left 01/28/2017    Procedure: VITRECTOMY, MECHANICAL, PARS PLANA APPROACH; WITH ENDOLASER PANRETINAL PHOTOCOAGULATION;  Surgeon: Melvyn Novas, MD;  [facility]: [facility];  Service: Ophthalmology    SKIN BIOPSY      VASECTOMY         Family  History   Problem Relation Age of Onset    Kidney disease Mother     Diabetes Mother     Hypertension Mother     Kidney disease Father     Cancer Father     Ulcers Father     Kidney disease Maternal Grandmother     Diabetes Maternal Grandmother     Glaucoma Neg Hx     Macular degeneration Neg Hx     Strabismus Neg Hx        Social History     Tobacco Use    Smoking status: Never     Passive exposure: Never    Smokeless tobacco: Never   Vaping Use    Vaping Use: Never used   Substance Use Topics    Alcohol use: No    Drug use: No             Objective:       Vital Signs  BP 112/70 (BP Site: L Arm, BP Position: Sitting, BP Cuff Size: Medium)  - Pulse 73  - Wt (!) 109.1 kg (240 lb 8 oz)  - SpO2 97%  - BMI 31.73 kg/m??      Exam  Well appearing male in nad.

## 2022-02-14 NOTE — Unmapped (Addendum)
Problem List Items Addressed This Visit          Unprioritized    Encounter for screening colonoscopy - Primary    History of colonic polyps     Planning on colonoscopy in 13 days.    Issues   1) colonoscopy prep.  This is already set up by GI labs and should be fine.  3 days dietary stepdown, and clearnace for the last 24 hours.    When not eating foods, it is important continue to ingest calories.  When not eating foods, would drink ginger ale or sprite with sugar).    2) Diabetes:  Currently taking jardiance 10 mg daily and tresiba insulin 70 units daily.    MORNING OF COLONOSCOPY (Nothing to eat) Hold jardiance the morning of the colonoscopy.  Decrease tresiba insulin to 35 units the morning of the colonoscopy.  No humalog insulin that morning.    DAY PRIOR TO COLONOSCOPY:  Since your calorie intake will be less, I would hold the Jardiance.  .  Check sugars 4 times a day to make sure no low sugars.  Goal on those three days is between 80-200.   3).  Anti coagulation.  Remaisn on coumadin for afib and LARGE PE.  This is high risk.  I would recommend we bridge with coumadin to lovenox.   5 days prior to colonoscopy, stop coumadin.   2 days prior to colonoscopy, start lovenox 100 mg subcutaneous twice a day.  HOLD LOVENOX the morning of colonoscopy.    The evening of the colonoscopy restart lovenox (same dose) (if they say it's ok, meaning that there wasn't a large risk of bleeding).  Contniue lovenox twice a day for up to 5 days until INR is 2-3.    The evening of the colonoscopy, restart coumadin prior dose.  Check INR 4 days.

## 2022-02-15 ENCOUNTER — Ambulatory Visit: Admit: 2022-02-15 | Discharge: 2022-02-16 | Payer: MEDICARE

## 2022-02-15 NOTE — Unmapped (Unsigned)
SPORTS MEDICINE RETURN VISIT    ASSESSMENT AND PLAN      Diagnosis ICD-10-CM Associated Orders   1. Decreased range of motion of right shoulder  M25.611            -- Discussed ongoing improvement with Physical Therapy (both formal and HEP), He reports 97% better. Considering his current trajectory it is agreeable that continue conservative care and defers injection today.   -- He understands the injection is still an option should he plateau or regress in his rehab.   -- Ultimate goal is to play golf again but his shoulder is not his rate limiting symptom at this time, his balance and instability secondary to LE neuropathy is the bigger issue.       No follow-ups on file.    Procedure(s):  None today    SUBJECTIVE     Chief Complaint:   Chief Complaint   Patient presents with    Right Shoulder - Pain     Eval R shoulder. States had PT. overhead. Some limited ROM. Anterior/ posterior pain. Had some tylenol but not over a week. Numbness. Tingling. Used to have elbow pain.         History of Present Illness: 76 y.o. male who presents for consideration of LVI for adhesive capsulitis s/p right proximal humerus fracture on 07/27/21.  Has continued to have pain when sitting at his computer for long periods of time.  Reports 97% improvement in symptoms, is pleased with current progression and questions whether he needs an injection at this time.       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 as calculated from the following:    Height as of 12/28/21: 185.4 cm (6' 1).    Weight as of 02/14/22: 109.1 kg (240 lb 8 oz).  Gen: Well-appearing male in no acute distress  MSK:   MSK: Right shoulder  Inspection: No edema, no erythema, skin intact  Palpation: No tenderness with direct palpation  ROM: Forward flexion 110, abduction 100, external rotation 35  Strength: 4+/5 strength in all planes range of motion of the shoulder  normal sensation RUE  radial pulses easily palpable    Prior Exam:   MSK: Right shoulder  Inspection: No edema, no  erythema, skin intact  Palpation: No tenderness when I palpate along the shoulder  ROM: Forward flexion 90, abduction 70, external rotation 20  Strength: 4/5 strength in all planes range of motion of the shoulder  normal sensation RUE  radial pulses easily palpable    Imaging/other tests:     XR 02/01/22: healing comminuted, minimally displaced R proximal humerus fx.     Verner Mccrone A. [facility] DO  Primary Care Sports Medicine Fellow  [facility]      Orthopaedic PROMIS:          02/15/2022   [facility]   Upper Extremity Physical Function CAT Score 10       PRO Status:  Patient completed PRO.      ADMINISTRATIVE     I have personally reviewed and interpreted the images (as available).  Point-of-care ultrasound imaging is on file and stored in a permanent location (if performed).  I have personally reviewed prior records and incorporated relevant information above (as available).    MEDICAL DECISION MAKING (level of service defined by 2/3 elements)     Number/Complexity of Problems Addressed 1 acute, uncomplicated illness or injury (99203/99213)   Amount/Complexity of Data to be Reviewed/Analyzed 2 points: Review prior notes (1 point per unique source); Review test results (1 point per unique test); Order tests (1 point per unique test) (99203/99213)   Risk of Complications/Morbidity/Mortality of Management Physical Therapy/Occupational Therapy (99203/99213)     TIME     Total Time for E/M Services on the Date of Encounter Time-based coding not utilized for this encounter     CONSULTATION     Consultation services provided No     MODIFIER -25     Significant, Separately Billable Evaluation and Management No       PROCEDURES     Procedures     DME     DME ORDER:  Dx:  ,

## 2022-02-19 MED FILL — MYFORTIC 180 MG TABLET,DELAYED RELEASE: ORAL | 30 days supply | Qty: 180 | Fill #3

## 2022-02-19 MED FILL — TACROLIMUS 1 MG CAPSULE, IMMEDIATE-RELEASE: ORAL | 30 days supply | Qty: 150 | Fill #10

## 2022-02-20 NOTE — Unmapped (Signed)
Spoke with patient's wife to return her call regarding her husband's upcoming procedure. David Ellis reports that he is a Kidney transplant patient and takes medications for that. Advised not to alter the transplant meds. Will need to take them at least 2 hours prior to time of procedure on that date. Go ahead and take them as normal, with adjusted timing.     No further questions at this time.

## 2022-02-26 NOTE — Unmapped (Signed)
Pt's wife left VM stating that she has a medication question surrounding pt's scheduled procedure tomorrow.     Returned call. Wife states patient takes Magnesium to supplement his anti-rejection medications. She states the magnesium pills are white in color. Informed pt, okay to take with antirejection meds today and tomorrow, as long as they are taken before 7:30am (2 hrs before procedure).    Pt has been off warfarin for several days, is bridging with lovenox, per given instructions.

## 2022-02-27 ENCOUNTER — Ambulatory Visit: Admit: 2022-02-27 | Discharge: 2022-02-27 | Payer: MEDICARE

## 2022-02-27 ENCOUNTER — Encounter: Admit: 2022-02-27 | Discharge: 2022-02-27 | Payer: MEDICARE | Attending: Anesthesiology | Primary: Anesthesiology

## 2022-02-27 MED ADMIN — propofoL (DIPRIVAN) injection: INTRAVENOUS | @ 14:00:00 | Stop: 2022-02-27

## 2022-02-27 MED ADMIN — sodium chloride (NS) 0.9 % infusion: 10 mL/h | INTRAVENOUS | @ 14:00:00 | Stop: 2022-02-27

## 2022-02-27 MED ADMIN — lidocaine (XYLOCAINE) 20 mg/mL (2 %) injection: INTRAVENOUS | @ 14:00:00 | Stop: 2022-02-27

## 2022-02-27 NOTE — Unmapped (Signed)
Patient's spouse called, patient is getting his colonoscopy done today. They were told to check his INR 4 days after his colonoscopy, which would be on Saturday. They have been attempting to find a [facility] [facility] site that will do labs on the weekend, and are wondering if they can have the INR done on Friday instead, or should maybe wait until Monday? Please advise.

## 2022-02-27 NOTE — Unmapped (Signed)
Friday morning please.    [name], M.D.     [facility] Internal [facility] at [facility]   [address]  [address]  [place], [place]  [address]  [phone]

## 2022-02-27 NOTE — Unmapped (Signed)
Spoke to patient's spouse regarding getting INR done on Friday morning, they verbalized understanding and agreement.

## 2022-03-02 ENCOUNTER — Ambulatory Visit: Admit: 2022-03-02 | Discharge: 2022-03-03 | Payer: MEDICARE

## 2022-03-02 LAB — CBC W/ AUTO DIFF
BASOPHILS ABSOLUTE COUNT: 0 10*9/L (ref 0.0–0.1)
BASOPHILS RELATIVE PERCENT: 0.4 %
EOSINOPHILS ABSOLUTE COUNT: 0.1 10*9/L (ref 0.0–0.5)
EOSINOPHILS RELATIVE PERCENT: 1.6 %
HEMATOCRIT: 44.6 % (ref 39.0–48.0)
HEMOGLOBIN: 14.6 g/dL (ref 12.9–16.5)
LYMPHOCYTES ABSOLUTE COUNT: 1.1 10*9/L (ref 1.1–3.6)
LYMPHOCYTES RELATIVE PERCENT: 16.6 %
MEAN CORPUSCULAR HEMOGLOBIN CONC: 32.9 g/dL (ref 32.0–36.0)
MEAN CORPUSCULAR HEMOGLOBIN: 28.2 pg (ref 25.9–32.4)
MEAN CORPUSCULAR VOLUME: 85.7 fL (ref 77.6–95.7)
MEAN PLATELET VOLUME: 8 fL (ref 6.8–10.7)
MONOCYTES ABSOLUTE COUNT: 0.6 10*9/L (ref 0.3–0.8)
MONOCYTES RELATIVE PERCENT: 8.9 %
NEUTROPHILS ABSOLUTE COUNT: 4.8 10*9/L (ref 1.8–7.8)
NEUTROPHILS RELATIVE PERCENT: 72.5 %
NUCLEATED RED BLOOD CELLS: 0 /100{WBCs} (ref <=4)
PLATELET COUNT: 185 10*9/L (ref 150–450)
RED BLOOD CELL COUNT: 5.2 10*12/L (ref 4.26–5.60)
RED CELL DISTRIBUTION WIDTH: 14.4 % (ref 12.2–15.2)
WBC ADJUSTED: 6.7 10*9/L (ref 3.6–11.2)

## 2022-03-02 LAB — BASIC METABOLIC PANEL
ANION GAP: 5 mmol/L (ref 5–14)
BLOOD UREA NITROGEN: 18 mg/dL (ref 9–23)
BUN / CREAT RATIO: 16
CALCIUM: 9.5 mg/dL (ref 8.7–10.4)
CHLORIDE: 108 mmol/L — ABNORMAL HIGH (ref 98–107)
CO2: 29.9 mmol/L (ref 20.0–31.0)
CREATININE: 1.12 mg/dL — ABNORMAL HIGH
EGFR CKD-EPI (2021) MALE: 69 mL/min/{1.73_m2} (ref >=60)
GLUCOSE RANDOM: 165 mg/dL (ref 70–179)
POTASSIUM: 5.4 mmol/L — ABNORMAL HIGH (ref 3.4–4.8)
SODIUM: 143 mmol/L (ref 135–145)

## 2022-03-02 LAB — MAGNESIUM: MAGNESIUM: 2 mg/dL (ref 1.6–2.6)

## 2022-03-02 LAB — PROTIME-INR
INR: 1.41
PROTIME: 16.2 s — ABNORMAL HIGH (ref 9.8–12.8)

## 2022-03-02 LAB — TACROLIMUS LEVEL: TACROLIMUS BLOOD: 5.5 ng/mL

## 2022-03-02 LAB — PHOSPHORUS: PHOSPHORUS: 3.6 mg/dL (ref 2.4–5.1)

## 2022-03-05 ENCOUNTER — Other Ambulatory Visit: Admit: 2022-03-05 | Discharge: 2022-03-06 | Payer: MEDICARE

## 2022-03-05 LAB — PROTIME-INR
INR: 1.73
PROTIME: 20 s — ABNORMAL HIGH (ref 9.8–12.8)

## 2022-03-09 NOTE — Unmapped (Signed)
Spoke to patient, patient verbalized understanding and agreement, stated he would get the INR done most likely this coming Monday.

## 2022-03-09 NOTE — Unmapped (Signed)
That's fine to reschedule INR to next week.

## 2022-03-09 NOTE — Unmapped (Signed)
Patient called, plan was for patient to have an INR check today. However patient stated he missed or miss-took a dose on Wednesday. Patient stated that since this would throw off the INR he is now planning on doing this on Monday or Tuesday unless otherwise advised to go ahead and do the INR today. Please advise.

## 2022-03-12 ENCOUNTER — Ambulatory Visit: Admit: 2022-03-12 | Discharge: 2022-03-13 | Payer: MEDICARE

## 2022-03-12 LAB — PROTIME-INR
INR: 1.68
PROTIME: 19.4 s — ABNORMAL HIGH (ref 9.8–12.8)

## 2022-03-12 MED ORDER — WARFARIN 5 MG TABLET
ORAL_TABLET | 0 refills | 0 days
Start: 2022-03-12 — End: ?

## 2022-03-12 NOTE — Unmapped (Signed)
Patient is requesting the following refill  Requested Prescriptions     Pending Prescriptions Disp Refills    warfarin (JANTOVEN) 5 MG tablet [Pharmacy Med Name: WARFARIN SODIUM 5 MG TAB] 90 tablet 0     Sig: TAKE 1 TABLET BY MOUTH DAILY WITH EVENING MEAL. TAKE 6 DAYS A WEEK (TUESDAY THROUGH SUNDAY) AS DIRECTED.       Order pended. Please advise. Thanks    Last OV: 02/14/2022   Next OV: Visit date not found

## 2022-03-13 MED ORDER — WARFARIN 5 MG TABLET
ORAL_TABLET | 0 refills | 0 days | Status: CP
Start: 2022-03-13 — End: ?

## 2022-03-15 NOTE — Unmapped (Signed)
[facility]GM [facility], forwarded to provider for signature.

## 2022-03-16 ENCOUNTER — Ambulatory Visit: Admit: 2022-03-16 | Discharge: 2022-03-17 | Payer: MEDICARE

## 2022-03-16 LAB — PROTIME-INR
INR: 1.95
PROTIME: 21.2 s — ABNORMAL HIGH (ref 9.9–12.6)

## 2022-03-21 DIAGNOSIS — I4891 Unspecified atrial fibrillation: Principal | ICD-10-CM

## 2022-03-21 MED ORDER — METOPROLOL SUCCINATE ER 100 MG TABLET,EXTENDED RELEASE 24 HR
ORAL_TABLET | 1 refills | 0 days | Status: CP
Start: 2022-03-21 — End: ?

## 2022-03-21 NOTE — Unmapped (Signed)
[facility] Refill Coordination Note    Specialty Medication(s) to be Shipped:   Transplant: Myfortic 180mg  and tacrolimus 1mg     Other medication(s) to be shipped: No additional medications requested for fill at this time     BOYKIN COSPER, DOB: [birth year]  Phone: [phone] (home)       All above HIPAA information was verified with patient.     Was a Nurse, learning disability used for this call? No    Completed refill call assessment today to schedule patient's medication shipment from the [facility] [phone]).  All relevant notes have been reviewed.     Specialty medication(s) and dose(s) confirmed: Regimen is correct and unchanged.   Changes to medications: Traegan reports no changes at this time.  Changes to insurance: No  New side effects reported not previously addressed with a pharmacist or physician: None reported  Questions for the pharmacist: No    Confirmed patient received a Conservation officer, historic buildings and a Surveyor, mining with first shipment. The patient will receive a drug information handout for each medication shipped and additional FDA Medication Guides as required.       DISEASE/MEDICATION-SPECIFIC INFORMATION        N/A    SPECIALTY MEDICATION ADHERENCE     Medication Adherence    Patient reported X missed doses in the last month: 0  Specialty Medication: MYFORTIC 180 MG EC tablet (mycophenolate)  Patient is on additional specialty medications: Yes  Additional Specialty Medications: tacrolimus 1 MG capsule (PROGRAF)  Patient Reported Additional Medication X Missed Doses in the Last Month: 0  Patient is on more than two specialty medications: No      Adherence tools used: patient uses a pill box to manage medications      Support network for adherence: family member                    Were doses missed due to medication being on hold? No    Myfortic 180 mg: 10 days of medicine on hand   tacrolimus 1 mg: 10 days of medicine on hand       REFERRAL TO PHARMACIST Referral to the pharmacist: Not needed      [facility]     Shipping address confirmed in Epic.     Delivery Scheduled: Yes, Expected medication delivery date: 03/27/22.     Medication will be delivered via Next Day Courier to the prescription address in Epic WAM.    [place] A Jhonathan Desroches   [facility] Specialty Technician

## 2022-03-21 NOTE — Unmapped (Signed)
Patient is requesting the following refill  Requested Prescriptions     Pending Prescriptions Disp Refills    metoPROLOL succinate (TOPROL-XL) 100 MG 24 hr tablet [Pharmacy Med Name: METOPROLOL SUCCINATE ER 100 MG TAB] 180 tablet 1     Sig: TAKE ONE TABLET TWICE DAILY       Recent Visits  Date Type Provider Dept   02/14/22 Office Visit Min, Dorann Ou, MD [facility] Internal Medicine [facility] [place]   01/18/22 Office Visit Zenaida Deed, Elder Negus, MD [facility] Internal Medicine Cardiology   07/18/21 Office Visit Min, Dorann Ou, MD [facility] Internal Medicine [facility] [place]   06/15/21 Office Visit Zenaida Deed, Elder Negus, MD [facility] Internal Medicine Cardiology   Showing recent visits within past 365 days with a meds authorizing provider and meeting all other requirements  Future Appointments  Date Type Provider Dept   04/16/22 Appointment Min, Dorann Ou, MD [facility] Internal Medicine [facility] [place]   07/26/22 Appointment Zenaida Deed, Elder Negus, MD [facility] Internal Medicine Cardiology   Showing future appointments within next 365 days with a meds authorizing provider and meeting all other requirements       Labs: Vitals:   BP Readings from Last 3 Encounters:   02/27/22 121/62   02/14/22 112/70   01/18/22 128/70    and   Pulse Readings from Last 3 Encounters:   02/27/22 68   02/14/22 73   01/18/22 81

## 2022-03-23 ENCOUNTER — Ambulatory Visit: Admit: 2022-03-23 | Discharge: 2022-03-24 | Payer: MEDICARE

## 2022-03-23 LAB — PROTIME-INR
INR: 2.9
PROTIME: 31.2 s — ABNORMAL HIGH (ref 9.9–12.6)

## 2022-03-26 MED FILL — TACROLIMUS 1 MG CAPSULE, IMMEDIATE-RELEASE: ORAL | 30 days supply | Qty: 150 | Fill #11

## 2022-03-26 MED FILL — MYFORTIC 180 MG TABLET,DELAYED RELEASE: ORAL | 30 days supply | Qty: 180 | Fill #4

## 2022-03-28 ENCOUNTER — Ambulatory Visit: Admit: 2022-03-28 | Discharge: 2022-03-29 | Payer: MEDICARE | Attending: "Endocrinology | Primary: "Endocrinology

## 2022-03-28 DIAGNOSIS — E1129 Type 2 diabetes mellitus with other diabetic kidney complication: Principal | ICD-10-CM

## 2022-03-28 NOTE — Unmapped (Signed)
Libre CGM linked to clinic, report scanned into media. POC glucose and A1C done today. PP 1 pm. 197 mg/dL.

## 2022-03-28 NOTE — Unmapped (Signed)
ASSESSMENT / PLAN  1. 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)  2. Encounter for long-term (current) use of insulin (CMS-HCC)  Keep up the great work with estimated HgbA1c 7.4 based on CGM readings and only 1% hypoglycemia while on mdi of insulin, GLP1a and SGLT2i.    If you are having fasting glucoses < 100, please reduce tresiba by 5 units at a time [initially 65units for a week].  If glucoses again trend up to > 120, you can increase a few units at a time.   It can take 3-4 days for [place] to reach steady state.      Will need to continue to focus on prandial coverage to reduce hyperglycemia.  - TSH; Future    3. Dizzy  4. History of kidney transplant  Monitored closly by Tplant/Nephrology on SGLT2i w proteinuria.  In setting of dizzyness with standing and while standing (and recent mildly elevated K, and SBP < 100) split jardiance and only take 5mg  daily.   Review with Nephrology and PCP in case they want to adjust either losartan or metoprolol as well.    5.  Diabetic polyneuropathy associated with type 2 diabetes mellitus (CMS-HCC)  Foot exam today intact. Qualifies for DM shoes w hx of amputation/foot deformity.    MEDICARE:  *The patient is currently using the therapeutic CGM as prescribed.  *The patient has diabetes.  *The patient is insulin-treated with 3 or more daily administrations of insulin (MDII)  prior to use of a CGM.   *The patient???s insulin treatment regimen requires frequent adjustments by the patient based on sliding scale.  *Within the last six months, I had an in-person visit with the beneficiary to evaluate their diabetes control and determine that the above criteria are met.   *Every six months I will continue to have an in-person visit with the beneficiary to assess adherence to their CGM regimen and diabetes treatment plan.      Return in about 6 months (around 09/27/2022).      SUBJECTIVE  PCP: [name]  Tplant: Amy Mottle  Optho:  Herma Ard  Wound: Keagy  Podiatry: Dr. Ether Griffins, [facility]   Derm: Davy Pique, [facility]    HPI: David Ellis is a 76 y.o. male who I last saw 10/2021 returns for follow up of T2DM with hx of kidney transplant.    Here w wife [name].    At last visit,we reduced [place] dose and now taking:  [place] 70units.  Humalog max up to 25u TIDAC [if <100, does not take, if 100-150 takes 10units, if 150-200 takes 15units,etc]  Trulicity 1.5mg  weekly.    In 12/2021, due to rise in urine protein, nephrology also added empagliflozin 10mg  daily.   Subsequent Nl K in 01/2022, and elevated K 5.4 in 9/20233.  GFR stable to improved.  FU w Nephrology 06/2021.     CGM downloaded and interpreted x 72 hours or more. Daily trends reviewed including patterns for 56% target range, only 1% hypoglycemia, GMI 7.4%.  Noted post prandial rises, followed by downward trend overnight.             Appetite OK. 10 lb loss since 08/2021. Largely stable at 240 lbs.  Feels pretty good.  Has episodes of transient pressure/pain behind eyes. No associated fever/sinus congestion.  Can wake shivering chills in the AM.  After putting on coat/cup of coffee resolves. Not during day.   Also in past several months transient dizzy spells when  standing or with standing. Normal glucose.  Hx of syncope in shower from excess BP control several years ago.   This is on top of poor balance -- has not been able to golf x 3 yrs.  Reduced vision. With stable optho exam.  Goes to PT twice a week and can now straighten L arm. Has not had PT to focus on balance.     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 Maintains  ARB and SGLT2i.  Peripheral neuropathy w hx of amputation.  On Neurontin.  Foot exam today.  BP tightly controlled. Maintained on BB and ARB. Prn lasix for edema  Lipids on low dose pravastatin 20.       Endocrine Obtained / Updated Problem List:   1. T2DM dx 1992, insulin roughly 2000:   Proliferative DR s/p hx vitreous hemorrhage and endolaser therapy..  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 No monofilament 07/2021  Hypoglycemic emergency requiring EMS 03/2011.   Historically, Victoza, Ozempic, Trulicity were not covered or too $$.   Offered to check cpeptide to see if he would qualify for insulin pump under medcare, though he is not interested.    Off MTF since end of 2015/early 2016.   2. 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   3. HTN   4. Nephrolithiasis   5. OSA on cpap   6. Aflutter s/p ablation 06/2011, atrial fibrillation  7. PE    Patient Active Problem List   Diagnosis    Type II diabetes mellitus with renal manifestations, uncontrolled    Essential (primary) hypertension    History of kidney transplant    Mixed hyperlipidemia    Retinopathy due to secondary diabetes (CMS-HCC)    Transplanted kidney    Other pulmonary embolism without acute cor pulmonale (CMS-HCC)    Edema    Right ventricular dysfunction    Atrial fibrillation (CMS-HCC)    Aftercare following organ transplant    Diverticulosis of colon    Routine general medical examination at a health care facility    OSA on CPAP    Diabetic polyneuropathy associated with type 2 diabetes mellitus (CMS-HCC)    Diabetic ulcer of left foot associated with type 2 diabetes mellitus (CMS-HCC)    Toe amputation status    Left knee pain    BCC (basal cell carcinoma), face    Acquired absence of other toe(s), unspecified side (CMS-HCC)    History of colonic polyps    Type 2 diabetes mellitus with foot ulcer (CODE) (CMS-HCC)    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)    Hypoglycemia associated with type 2 diabetes mellitus (CMS-HCC)    Class 2 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    Immunosuppressed status (CMS-HCC)    Syncope    Non-healing open wound of toe    At high risk for falls    Encounter for screening colonoscopy     Past Medical History:   Diagnosis Date    Arthritis     Atrial flutter (CMS-HCC)     Cataract     Diabetes mellitus (CMS-HCC)     Diabetic nephropathy (CMS-HCC)     Diabetic retinopathy (CMS-HCC)     Fractures     Ganglion cyst     Hand injury     Heart disease  Hyperlipidemia     Hypertension     Impaired mobility     Joint pain     Lack of access to transportation     Osteomyelitis (CMS-HCC) 11/2014    Pulmonary embolism (CMS-HCC) 05/2014    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      aspirin (ECOTRIN) 81 MG tablet Take 1 tablet (81 mg total) by mouth daily as needed (when flying).      BD ULTRA-FINE NANO PEN NEEDLE 32 gauge x 5/32 (4 mm) Ndle USE 3 TIMES DAILY BEFORE MEALS 300 each 3    blood-glucose meter kit Use as instructed. 1 each 0    cholecalciferol, vitamin D3-50 mcg, 2,000 unit,, 50 mcg (2,000 unit) tablet Take 1 tablet (50 mcg total) by mouth daily.      empagliflozin (JARDIANCE) 10 mg tablet Take 1 tablet (10 mg total) by mouth every morning. 90 tablet 3    enoxaparin (LOVENOX) 100 mg/mL injection Inject 1 mL (100 mg total) under the skin Two (2) times a day. 20 mL 1    flash glucose sensor (FREESTYLE LIBRE 14 DAY SENSOR) kit by Other route every fourteen (14) days. 2 each 11    furosemide (LASIX) 40 MG tablet Take 1 tablet (40 mg total) by mouth daily. 40 mg daily. And 1 additional tablet for edema.      gabapentin (NEURONTIN) 300 MG capsule TAKE 1 CAPSULE BY MOUTH TWICE DAILY AS DIRECTED (Patient taking differently: Take 1 capsule (300 mg total) by mouth two (2) times a day.) 180 capsule 3    HUMALOG KWIKPEN INSULIN 100 unit/mL injection pen INJECT 15-25 UNITS WITH MEALS + CORRECTION **MAX 100 UNITS A DAY** 60 mL 3    insulin degludec 200 unit/mL (3 mL) InPn Inject 0.4 mL (80 Units total) under the skin daily. Titrate up to 100units daily as instructed. 45 mL 3    losartan (COZAAR) 25 MG tablet Take 1 tablet (25 mg total) by mouth daily. 90 tablet 3    magnesium chloride (SLOW_MAG) 64 mg TbEC Take 128 mg by mouth three (3) times a day (at 6am, noon and 6pm). (2 tablets) 9am 1 pm and 9 pm      metoPROLOL succinate (TOPROL-XL) 100 MG 24 hr tablet TAKE ONE TABLET TWICE DAILY 180 tablet 1    miscellaneous medical supply Misc 1 application by Miscellaneous route daily as needed. Lightweight wheelchair. 1 each 0    miscellaneous medical supply Misc 1 application by Miscellaneous route nightly. Recommend CPAP 14 cm H2O with EPR 3 and heated humidifier for nasal dryness, mask: ResMed Activa Lt nasal mask (Large). 1 each 0    multivitamin capsule Take 1 capsule by mouth daily.      MYFORTIC 180 mg EC tablet Take 3 tablets (540 mg total) by mouth two (2) times a day. 540 tablet 3    pravastatin (PRAVACHOL) 20 MG tablet TAKE ONE TABLET BY MOUTH EVERY DAY 90 tablet 3    tacrolimus (PROGRAF) 1 MG capsule Take 3 capsules (3 mg total) by mouth in the morning AND 2 capsules (2 mg total) in the evening. 450 capsule 3  TRULICITY 1.5 mg/0.5 mL PnIj INJECT 1.5MG  (0.5ML) SUBCUTANEOUSLY ONCEA WEEK 2 mL 11    warfarin (JANTOVEN) 2.5 MG tablet TAKE 1 TABLET BY MOUTH ONCE DAILY 30 tablet 11    warfarin (JANTOVEN) 5 MG tablet TAKE 1 TABLET BY MOUTH DAILY WITH EVENING MEAL. TAKE 6 DAYS A WEEK (TUESDAY THROUGH SUNDAY) AS DIRECTED. 90 tablet 0     No current facility-administered medications for this visit.     Facility-Administered Medications Ordered in Other Visits   Medication Dose Route Frequency Provider Last Rate Last Admin    bevacizumab    PRN (once a day) Melvyn Novas, MD   2.5 mg at 01/28/17 1610       Social History: reviewed: his wife was kidney donor. rare etoh. no cig. 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     Family History: 47yo son has uncertain clotting disorder.    Other past medical history, medications, allergies and problem list are reviewed in the medical record    REVIEW OF SYSTEMS: Pertinent positives and negatives as in HPI, otherwise, all other systems reviewed are negative.     OBJECTIVE   Vitals:    03/28/22 1405 03/28/22 1453   BP: 92/64 99/50   Pulse: 81 86   Temp: 37.1 ??C (98.7 ??F)    TempSrc: Temporal    Weight: (!) 109 kg (240 lb 6.4 oz)    Height: 185.4 cm (6' 0.99)      Body mass index is 31.72 kg/m??.    BP Readings from Last 3 Encounters:   02/27/22 121/62   02/14/22 112/70   01/18/22 128/70     Wt Readings from Last 3 Encounters:   03/28/22 (!) 109 kg (240 lb 6.4 oz)   02/27/22 (!) 108.9 kg (240 lb)   02/14/22 (!) 109.1 kg (240 lb 8 oz)     PSYCH: calm, cooperative with full affect  EYES: eomi, sclera anicteric  LE:  No pitting edema  No DP nor PT detected  Skin intact, but flaking  S/p amputation of left lateral toes with incision intact    Pertinent labs:  Office Visit on 03/28/2022   Component Date Value Ref Range Status    Glucose, POC 03/28/2022 197 (H)  70 - 179 mg/dL Final    HGB R6E, POC 45/40/9811 7.6 (H)  <7.0 % Final    A1c Glycemic Goal: <7.0%     **Goals should be individualized; more or less stringent A1c glycemic goals may be appropriate for individual patients.      (Adopted from: 2020 ADA Standards of Medical Care In Diabetes)   Point of Care A1c testing is not FDA-approved for the diagnosis of Diabetes.     EST AVERAGE GLUCOSE, POC 03/28/2022 171  mg/dL Final   Appointment on 03/23/2022   Component Date Value Ref Range Status    PT 03/23/2022 31.2 (H)  9.9 - 12.6 sec Final    INR 03/23/2022 2.90   Final   Appointment on 03/16/2022   Component Date Value Ref Range Status    PT 03/16/2022 21.2 (H)  9.9 - 12.6 sec Final    INR 03/16/2022 1.95   Final   Appointment on 03/12/2022   Component Date Value Ref Range Status    PT 03/12/2022 19.4 (H)  9.8 - 12.8 sec Final    INR 03/12/2022 1.68   Final   Lab on 03/05/2022   Component Date Value Ref Range Status PT 03/05/2022 20.0 (H)  9.8 - 12.8 sec Final  INR 03/05/2022 1.73   Final   Appointment on 03/02/2022   Component Date Value Ref Range Status    Sodium 03/02/2022 143  135 - 145 mmol/L Final    Potassium 03/02/2022 5.4 (H)  3.4 - 4.8 mmol/L Final    Chloride 03/02/2022 108 (H)  98 - 107 mmol/L Final    CO2 03/02/2022 29.9  20.0 - 31.0 mmol/L Final    Anion Gap 03/02/2022 5  5 - 14 mmol/L Final    BUN 03/02/2022 18  9 - 23 mg/dL Final    Creatinine 57/84/6962 1.12 (H)  0.60 - 1.10 mg/dL Final    BUN/Creatinine Ratio 03/02/2022 16   Final    eGFR CKD-EPI (2021) Male 03/02/2022 69  >=60 mL/min/1.57m2 Final    eGFR calculated with CKD-EPI 2021 equation in accordance with [employer] and [employer] of Nephrology Task Force recommendations.    Glucose 03/02/2022 165  70 - 179 mg/dL Final    Calcium 95/28/4132 9.5  8.7 - 10.4 mg/dL Final    Magnesium 44/06/270 2.0  1.6 - 2.6 mg/dL Final    Phosphorus 53/66/4403 3.6  2.4 - 5.1 mg/dL Final    Tacrolimus, Timed 03/02/2022 5.5  ng/mL Final    PT 03/02/2022 16.2 (H)  9.8 - 12.8 sec Final    INR 03/02/2022 1.41   Final    WBC 03/02/2022 6.7  3.6 - 11.2 10*9/L Final    RBC 03/02/2022 5.20  4.26 - 5.60 10*12/L Final    HGB 03/02/2022 14.6  12.9 - 16.5 g/dL Final    HCT 47/42/5956 44.6  39.0 - 48.0 % Final    MCV 03/02/2022 85.7  77.6 - 95.7 fL Final    MCH 03/02/2022 28.2  25.9 - 32.4 pg Final    MCHC 03/02/2022 32.9  32.0 - 36.0 g/dL Final    RDW 38/75/6433 14.4  12.2 - 15.2 % Final    MPV 03/02/2022 8.0  6.8 - 10.7 fL Final    Platelet 03/02/2022 185  150 - 450 10*9/L Final    nRBC 03/02/2022 0  <=4 /100 WBCs Final    Neutrophils % 03/02/2022 72.5  % Final    Lymphocytes % 03/02/2022 16.6  % Final    Monocytes % 03/02/2022 8.9  % Final    Eosinophils % 03/02/2022 1.6  % Final    Basophils % 03/02/2022 0.4  % Final    Absolute Neutrophils 03/02/2022 4.8  1.8 - 7.8 10*9/L Final    Absolute Lymphocytes 03/02/2022 1.1  1.1 - 3.6 10*9/L Final    Absolute Monocytes 03/02/2022 0.6  0.3 - 0.8 10*9/L Final    Absolute Eosinophils 03/02/2022 0.1  0.0 - 0.5 10*9/L Final    Absolute Basophils 03/02/2022 0.0  0.0 - 0.1 10*9/L Final   Admission on 02/27/2022, Discharged on 02/27/2022   Component Date Value Ref Range Status    Diagnosis 02/27/2022    Final                    Value:This result contains rich text formatting which cannot be displayed here.    Clinical History 02/27/2022    Final                    Value:This result contains rich text formatting which cannot be displayed here.    Gross Description 02/27/2022    Final  Value:This result contains rich text formatting which cannot be displayed here.    Microscopic Description 02/27/2022    Final                    Value:This result contains rich text formatting which cannot be displayed here.    Disclaimer 02/27/2022    Final                    Value:This result contains rich text formatting which cannot be displayed here.     Lab Results   Component Value Date    Hemoglobin A1C 8.3 (H) 11/28/2021    Hemoglobin A1C 8.4 (H) 09/20/2021    Hemoglobin A1C 8.6 (H) 07/28/2021    Hemoglobin A1C 9.0 (H) 06/08/2021    HB A1C, RAP/HGATE 6.7 (H) 04/07/2012    HB A1C, RAP/HGATE 7.4 (H) 03/30/2011    HGB A1C, POC 8.8 (H) 07/28/2020    HGB A1C, POC 7.3 (H) 01/27/2020    HGB A1C, POC 9.5 (H) 11/18/2018    HGB A1C, POC 9.3 (H) 04/24/2018    Hemoglobin A1c 7.6 (H) 05/17/2014     No components found for: SODIUM  Lab Results   Component Value Date    K 5.4 (H) 03/02/2022     Lab Results   Component Value Date    CREATININE 1.12 (H) 03/02/2022     Lab Results   Component Value Date    PTH 114.5 (H) 08/21/2019    CALCIUM 9.5 03/02/2022     Lab Results   Component Value Date    ALT 17 12/28/2021    ALT 22 04/21/2015     Lab Results   Component Value Date    CHOL 131 11/28/2021     Lab Results   Component Value Date    LDL 68 11/28/2021     Lab Results   Component Value Date    HDL 36 (L) 11/28/2021     Lab Results Component Value Date    TRIG 133 11/28/2021     Lab Results   Component Value Date    TSH 2.202 07/02/2017    TSH 1.840 07/13/2015    TSH 1.420 07/09/2015     Lab Results   Component Value Date    TSH 2.202 07/02/2017     No results found for: CBC  No results found for: VITAMINB12  Lab Results   Component Value Date    VITD2 <5 08/17/2015    VITD3 42 08/17/2015    VITDTOTAL 31.9 12/28/2021     No results found for: Christa See, MSHCGMOM, MALBCRERAT  Lab Results   Component Value Date    Protein, Ur 41.9 12/28/2021    Protein, Ur 15 08/05/2014    Protein/Creatinine Ratio, Urine 0.314 12/28/2021    Protein/Creatinine Ratio, Urine 0.116 08/05/2014       IMAGING:

## 2022-03-28 NOTE — Unmapped (Addendum)
In setting of dizzyness with standing and while standing (and recent mildly elevated K) split jardiance and only take 5mg  daily.     Review with Nephrology and PCP in case they want to adjust either losartan or metoprolol as well.    If you are waking with chills, take temperature. Can check TSH w labs next week.    If you are having fasting glucoses < 100, please reduce tresiba by 5 units at a time [initially 65units for a week].  If glucoses again trend up to > 120, you can increase a few units at a time.   It can take 3-4 days for [place] to reach steady state.      Keep up the great work with estimated HgbA1c 7.4 based on CGM readings and only 1% hypoglycemia.

## 2022-04-02 ENCOUNTER — Other Ambulatory Visit: Admit: 2022-04-02 | Discharge: 2022-04-03 | Payer: MEDICARE

## 2022-04-02 LAB — PROTIME-INR
INR: 2.63
PROTIME: 28.4 s — ABNORMAL HIGH (ref 9.9–12.6)

## 2022-04-02 LAB — CBC W/ AUTO DIFF
BASOPHILS ABSOLUTE COUNT: 0 10*9/L (ref 0.0–0.1)
BASOPHILS RELATIVE PERCENT: 0.4 %
EOSINOPHILS ABSOLUTE COUNT: 0.1 10*9/L (ref 0.0–0.5)
EOSINOPHILS RELATIVE PERCENT: 1.6 %
HEMATOCRIT: 44.7 % (ref 39.0–48.0)
HEMOGLOBIN: 14.8 g/dL (ref 12.9–16.5)
LYMPHOCYTES ABSOLUTE COUNT: 0.9 10*9/L — ABNORMAL LOW (ref 1.1–3.6)
LYMPHOCYTES RELATIVE PERCENT: 11.2 %
MEAN CORPUSCULAR HEMOGLOBIN CONC: 33.1 g/dL (ref 32.0–36.0)
MEAN CORPUSCULAR HEMOGLOBIN: 28.2 pg (ref 25.9–32.4)
MEAN CORPUSCULAR VOLUME: 85.2 fL (ref 77.6–95.7)
MEAN PLATELET VOLUME: 7.6 fL (ref 6.8–10.7)
MONOCYTES ABSOLUTE COUNT: 0.6 10*9/L (ref 0.3–0.8)
MONOCYTES RELATIVE PERCENT: 7.8 %
NEUTROPHILS ABSOLUTE COUNT: 6 10*9/L (ref 1.8–7.8)
NEUTROPHILS RELATIVE PERCENT: 79 %
NUCLEATED RED BLOOD CELLS: 0 /100{WBCs} (ref <=4)
PLATELET COUNT: 211 10*9/L (ref 150–450)
RED BLOOD CELL COUNT: 5.25 10*12/L (ref 4.26–5.60)
RED CELL DISTRIBUTION WIDTH: 14.4 % (ref 12.2–15.2)
WBC ADJUSTED: 7.6 10*9/L (ref 3.6–11.2)

## 2022-04-02 LAB — BASIC METABOLIC PANEL
ANION GAP: 5 mmol/L (ref 5–14)
BLOOD UREA NITROGEN: 33 mg/dL — ABNORMAL HIGH (ref 9–23)
BUN / CREAT RATIO: 23
CALCIUM: 9.4 mg/dL (ref 8.7–10.4)
CHLORIDE: 109 mmol/L — ABNORMAL HIGH (ref 98–107)
CO2: 28.3 mmol/L (ref 20.0–31.0)
CREATININE: 1.43 mg/dL — ABNORMAL HIGH
EGFR CKD-EPI (2021) MALE: 51 mL/min/{1.73_m2} — ABNORMAL LOW (ref >=60)
GLUCOSE RANDOM: 135 mg/dL (ref 70–179)
POTASSIUM: 5.8 mmol/L — ABNORMAL HIGH (ref 3.4–4.8)
SODIUM: 142 mmol/L (ref 135–145)

## 2022-04-02 LAB — TSH: THYROID STIMULATING HORMONE: 1.164 u[IU]/mL (ref 0.550–4.780)

## 2022-04-02 LAB — TACROLIMUS LEVEL: TACROLIMUS BLOOD: 4 ng/mL

## 2022-04-02 LAB — PHOSPHORUS: PHOSPHORUS: 3.9 mg/dL (ref 2.4–5.1)

## 2022-04-02 LAB — MAGNESIUM: MAGNESIUM: 2.4 mg/dL (ref 1.6–2.6)

## 2022-04-02 NOTE — Unmapped (Signed)
Normal thyroid function

## 2022-04-05 DIAGNOSIS — E875 Hyperkalemia: Principal | ICD-10-CM

## 2022-04-16 ENCOUNTER — Ambulatory Visit: Admit: 2022-04-16 | Discharge: 2022-04-17 | Payer: MEDICARE

## 2022-04-16 DIAGNOSIS — I10 Essential (primary) hypertension: Principal | ICD-10-CM

## 2022-04-16 DIAGNOSIS — E875 Hyperkalemia: Principal | ICD-10-CM

## 2022-04-16 DIAGNOSIS — Z940 Kidney transplant status: Principal | ICD-10-CM

## 2022-04-16 DIAGNOSIS — R799 Abnormal finding of blood chemistry, unspecified: Principal | ICD-10-CM

## 2022-04-16 DIAGNOSIS — I4891 Unspecified atrial fibrillation: Principal | ICD-10-CM

## 2022-04-16 DIAGNOSIS — I2699 Other pulmonary embolism without acute cor pulmonale: Principal | ICD-10-CM

## 2022-04-16 LAB — COMPREHENSIVE METABOLIC PANEL
ALBUMIN: 3.5 g/dL (ref 3.4–5.0)
ALKALINE PHOSPHATASE: 79 U/L (ref 46–116)
ALT (SGPT): 15 U/L (ref 10–49)
ANION GAP: 7 mmol/L (ref 5–14)
AST (SGOT): 23 U/L (ref <=34)
BILIRUBIN TOTAL: 0.6 mg/dL (ref 0.3–1.2)
BLOOD UREA NITROGEN: 25 mg/dL — ABNORMAL HIGH (ref 9–23)
BUN / CREAT RATIO: 20
CALCIUM: 9.2 mg/dL (ref 8.7–10.4)
CHLORIDE: 106 mmol/L (ref 98–107)
CO2: 30 mmol/L (ref 20.0–31.0)
CREATININE: 1.27 mg/dL — ABNORMAL HIGH
EGFR CKD-EPI (2021) MALE: 59 mL/min/{1.73_m2} — ABNORMAL LOW (ref >=60)
GLUCOSE RANDOM: 177 mg/dL (ref 70–179)
POTASSIUM: 5.8 mmol/L — ABNORMAL HIGH (ref 3.4–4.8)
PROTEIN TOTAL: 6.9 g/dL (ref 5.7–8.2)
SODIUM: 143 mmol/L (ref 135–145)

## 2022-04-16 LAB — LIPID PANEL
CHOLESTEROL/HDL RATIO SCREEN: 4.1 (ref 1.0–4.5)
CHOLESTEROL: 142 mg/dL (ref <=200)
HDL CHOLESTEROL: 35 mg/dL — ABNORMAL LOW (ref 40–60)
LDL CHOLESTEROL CALCULATED: 80 mg/dL (ref 40–99)
NON-HDL CHOLESTEROL: 107 mg/dL (ref 70–130)
TRIGLYCERIDES: 137 mg/dL (ref 0–150)
VLDL CHOLESTEROL CAL: 27.4 mg/dL (ref 12–42)

## 2022-04-16 LAB — CBC W/ AUTO DIFF
BASOPHILS ABSOLUTE COUNT: 0.1 10*9/L (ref 0.0–0.1)
BASOPHILS RELATIVE PERCENT: 0.8 %
EOSINOPHILS ABSOLUTE COUNT: 0.1 10*9/L (ref 0.0–0.5)
EOSINOPHILS RELATIVE PERCENT: 1.1 %
HEMATOCRIT: 44 % (ref 39.0–48.0)
HEMOGLOBIN: 14.7 g/dL (ref 12.9–16.5)
LYMPHOCYTES ABSOLUTE COUNT: 0.8 10*9/L — ABNORMAL LOW (ref 1.1–3.6)
LYMPHOCYTES RELATIVE PERCENT: 10.6 %
MEAN CORPUSCULAR HEMOGLOBIN CONC: 33.5 g/dL (ref 32.0–36.0)
MEAN CORPUSCULAR HEMOGLOBIN: 28.3 pg (ref 25.9–32.4)
MEAN CORPUSCULAR VOLUME: 84.6 fL (ref 77.6–95.7)
MEAN PLATELET VOLUME: 7.8 fL (ref 6.8–10.7)
MONOCYTES ABSOLUTE COUNT: 0.6 10*9/L (ref 0.3–0.8)
MONOCYTES RELATIVE PERCENT: 7.1 %
NEUTROPHILS ABSOLUTE COUNT: 6.3 10*9/L (ref 1.8–7.8)
NEUTROPHILS RELATIVE PERCENT: 80.4 %
PLATELET COUNT: 211 10*9/L (ref 150–450)
RED BLOOD CELL COUNT: 5.21 10*12/L (ref 4.26–5.60)
RED CELL DISTRIBUTION WIDTH: 14.1 % (ref 12.2–15.2)
WBC ADJUSTED: 7.8 10*9/L (ref 3.6–11.2)

## 2022-04-16 LAB — PROTIME-INR
INR: 2.6
PROTIME: 28.3 s — ABNORMAL HIGH (ref 9.9–12.6)

## 2022-04-16 MED ORDER — TACROLIMUS 1 MG CAPSULE, IMMEDIATE-RELEASE
ORAL_CAPSULE | ORAL | 3 refills | 90 days
Start: 2022-04-16 — End: ?

## 2022-04-16 NOTE — Unmapped (Signed)
Check creatinine and tac level today; forward to nephrology.

## 2022-04-16 NOTE — Unmapped (Signed)
Problem List Items Addressed This Visit          High    Atrial fibrillation (CMS-HCC) - Primary    Relevant Orders    Lipid Panel    PT-INR    Transplanted kidney    Relevant Orders    Comprehensive Metabolic Panel    CBC w/ Differential    Tacrolimus Level, Timed     Other Visit Diagnoses       Abnormal finding of blood chemistry, unspecified        Relevant Orders    Lipid Panel

## 2022-04-16 NOTE — Unmapped (Signed)
Patient ID: David Ellis is a 76 y.o. male who presents for eval of medical issues.      Assessment/Plan:        Atrial fibrillation (CMS-HCC)  Tolerating coumadin well without bleeding.  Check INR today.    REmains on toprol and well rate controlled.    Type II diabetes mellitus with renal manifestations, uncontrolled (CMS-HCC)  Doing well with medical therapy through Dr. Tiburcio Pea.        Transplanted kidney  Check creatinine and tac level today; forward to nephrology.        Other pulmonary embolism without acute cor pulmonale (CMS-HCC)  REmains on [facility] for this and afib.    TOlerating well.        Essential (primary) hypertension  BP well controlled on current therapy.        Orders Placed This Encounter   Procedures    Comprehensive Metabolic Panel    Lipid Panel    CBC w/ Differential    PT-INR    Tacrolimus Level, Timed       Medication adherence and barriers to the treatment plan have been addressed. Opportunities to optimize healthy behaviors have been discussed. Patient / caregiver voiced understanding.       Return in about 6 months (around 10/15/2022).     Subjective:     Current Health Status  Patient Active Problem List    Diagnosis Date Noted    Diabetic ulcer of left foot associated with type 2 diabetes mellitus (CMS-HCC) 07/03/2014    OSA on CPAP 02/17/2014    Routine general medical examination at a health care facility 11/17/2013    Atrial fibrillation (CMS-HCC) 07/06/2013    Right ventricular dysfunction 06/30/2013    Other pulmonary embolism without acute cor pulmonale (CMS-HCC) 05/23/2013    Transplanted kidney     Mixed hyperlipidemia 10/30/2011    Type II diabetes mellitus with renal manifestations, uncontrolled 03/26/2011    Essential (primary) hypertension 10/10/2010    Encounter for screening colonoscopy 02/14/2022    Non-healing open wound of toe 07/27/2021    At high risk for falls 07/27/2021    Syncope 02/03/2019    Immunosuppressed status (CMS-HCC) 09/02/2018    Class 2 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 01/01/2018    Hypoglycemia associated with type 2 diabetes mellitus (CMS-HCC) 09/03/2016    BCC (basal cell carcinoma), face 07/18/2015    Left knee pain 11/09/2014    Toe amputation status 08/10/2014    Acquired absence of other toe(s), unspecified side (CMS-HCC) 08/10/2014    Type 2 diabetes mellitus with foot ulcer (CODE) (CMS-HCC) 07/03/2014    Diabetic polyneuropathy associated with type 2 diabetes mellitus (CMS-HCC) 03/15/2014    Aftercare following organ transplant 08/27/2013    Edema 06/30/2013    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    Did have some diffiulty with low sugars while having colonoscopy.  Coumadin/lovenox transition did well.   All of this has settled.  Just saw Dr. Tiburcio Pea for diabetes and doing ok.      Has had flu shot.  Not yet with COVID shot or RSV shot  And has not yet had COVID.    Coumad 5 mg daily.  Otherwise, doing well.      The patient is without complaints of chest pain, sob, fever, chills, nausea, vomiting, diarrhea, constipation, BRBPR, melena, abdominal pains, weight changes, confusion, headaches.      Allergies   Allergen Reactions    Egg Derived Rash    Enalapril Cough    Epinephrine Palpitations    Grass Pollen-[place], Standard Itching    Levofloxacin Other (See Comments)     Per patient    Lisinopril Rash    Mepivacaine Hcl Palpitations    Penicillins Other (See Comments)     As child  Tolerated amp/sulbactam without side effects during 10/2014 hospitalization       Current Outpatient Medications   Medication Sig Dispense Refill    acetaminophen (TYLENOL) 325 MG tablet Take by mouth.      BD ULTRA-FINE NANO PEN NEEDLE 32 gauge x 5/32 (4 mm) Ndle USE 3 TIMES DAILY BEFORE MEALS 300 each 3    blood-glucose meter kit Use as instructed. 1 each 0    cholecalciferol, vitamin D3-50 mcg, 2,000 unit,, 50 mcg (2,000 unit) tablet Take 1 tablet (50 mcg total) by mouth daily.      empagliflozin (JARDIANCE) 10 mg tablet Take 1 tablet (10 mg total) by mouth every morning. 90 tablet 3    flash glucose sensor (FREESTYLE LIBRE 14 DAY SENSOR) kit by Other route every fourteen (14) days. 2 each 11    furosemide (LASIX) 40 MG tablet Take 1 tablet (40 mg total) by mouth daily. 40 mg daily. And 1 additional tablet for edema.      gabapentin (NEURONTIN) 300 MG capsule TAKE 1 CAPSULE BY MOUTH TWICE DAILY AS DIRECTED (Patient taking differently: Take 1 capsule (300 mg total) by mouth two (2) times a day.) 180 capsule 3    HUMALOG KWIKPEN INSULIN 100 unit/mL injection pen INJECT 15-25 UNITS WITH MEALS + CORRECTION **MAX 100 UNITS A DAY** 60 mL 3    insulin degludec 200 unit/mL (3 mL) InPn Inject 0.4 mL (80 Units total) under the skin daily. Titrate up to 100units daily as instructed. 45 mL 3    losartan (COZAAR) 25 MG tablet Take 1 tablet (25 mg total) by mouth daily. 90 tablet 3    magnesium chloride (SLOW_MAG) 64 mg TbEC Take 128 mg by mouth three (3) times a day (at 6am, noon and 6pm). (2 tablets) 9am 1 pm and 9 pm      metoPROLOL succinate (TOPROL-XL) 100 MG 24 hr tablet TAKE ONE TABLET TWICE DAILY 180 tablet 1    miscellaneous medical supply Misc 1 application by Miscellaneous route daily as needed. Lightweight wheelchair. 1 each 0    miscellaneous medical supply Misc 1 application by Miscellaneous route nightly. Recommend CPAP 14 cm H2O with EPR 3 and heated humidifier for nasal dryness, mask: ResMed Activa Lt nasal mask (Large). 1 each 0    multivitamin capsule Take 1 capsule by mouth daily.      MYFORTIC 180 mg EC tablet Take 3 tablets (540 mg total) by mouth two (2) times a day. 540 tablet 3    pravastatin (PRAVACHOL) 20 MG tablet TAKE ONE TABLET BY MOUTH EVERY DAY 90 tablet 3    tacrolimus (PROGRAF) 1 MG capsule Take 3 capsules (3 mg total) by mouth in the morning AND 2 capsules (2 mg total) in the evening. 450 capsule 3    TRULICITY 1.5 mg/0.5 mL PnIj INJECT 1.5MG  (0.5ML) SUBCUTANEOUSLY ONCEA WEEK 2 mL 11    warfarin (JANTOVEN) 5  MG tablet TAKE 1 TABLET BY MOUTH DAILY WITH EVENING MEAL. TAKE 6 DAYS A WEEK (TUESDAY THROUGH SUNDAY) AS DIRECTED. 90 tablet 0    aspirin (ECOTRIN) 81 MG tablet Take 1 tablet (81 mg total) by mouth daily as needed (when flying). (Patient not taking: Reported on 04/16/2022)       No current facility-administered medications for this visit.     Facility-Administered Medications Ordered in Other Visits   Medication Dose Route Frequency Provider Last Rate Last Admin    bevacizumab    PRN (once a day) Waunita Schooner Margaree Mackintosh, MD   2.5 mg at 01/28/17 0750       Past Medical History:   Diagnosis Date    Arthritis     Atrial flutter (CMS-HCC)     Cataract     Diabetes mellitus (CMS-HCC)     Diabetic nephropathy (CMS-HCC)     Diabetic retinopathy (CMS-HCC)     Fractures     Ganglion cyst     Hand injury     Heart disease     Hyperlipidemia     Hypertension     Impaired mobility     Joint pain     Lack of access to transportation     Osteomyelitis (CMS-HCC) 11/2014    Pulmonary embolism (CMS-HCC) 05/2014    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    PR AMPUTATION METATARSAL+TOE,SINGLE Left 07/06/2014    Procedure: AMPUTATION, METATARSAL, WITH TOE SINGLE;  Surgeon: Marion Downer, MD;  Location: MAIN OR [facility];  Service: Vascular    PR AMPUTATION METATARSAL+TOE,SINGLE Left 10/28/2014    Procedure: AMPUTATION, METATARSAL, WITH TOE SINGLE;  Surgeon: Maple Mirza, MD;  Location: MAIN OR [place];  Service: Vascular    PR COLONOSCOPY FLX DX W/COLLJ SPEC WHEN PFRMD N/A 02/27/2022    Procedure: COLONOSCOPY, FLEXIBLE, PROXIMAL TO SPLENIC FLEXURE; DIAGNOSTIC, W/WO COLLECTION SPECIMEN BY BRUSH OR WASH;  Surgeon: Luanne Bras, MD;  Location: [facility] PROCED[facility];  Service: Gastroenterology    PR COLONOSCOPY W/BIOPSY SINGLE/MULTIPLE  02/27/2022    Procedure: COLONOSCOPY, FLEXIBLE, PROXIMAL TO SPLENIC FLEXURE; WITH BIOPSY, SINGLE OR MULTIPLE;  Surgeon: Luanne Bras, MD;  Location: [facility] PROC[facility];  Service: Gastroenterology    PR COLSC FLX W/RMVL OF TUMOR POLYP LESION SNARE TQ Left 02/27/2022    Procedure: COLONOSCOPY FLEX; W/REMOV TUMOR/LES BY SNARE;  Surgeon: Luanne Bras, MD;  Location: [facility];  Service: Gastroenterology    PR VITRECTOMY,PANRETINAL LASER RX Right 11/26/2016    Procedure: VITRECTOMY, MECHANICAL, PARS PLANA APPROACH; WITH ENDOLASER PANRETINAL PHOTOCOAGULATION;  Surgeon: Melvyn Novas, MD;  [facility];  Service: Ophthalmology    PR Hessie Knows LASER RX Left 01/28/2017    Procedure: VITRECTOMY, MECHANICAL, PARS PLANA APPROACH; WITH ENDOLASER PANRETINAL PHOTOCOAGULATION;  Surgeon: Melvyn Novas, MD;  Location: [facility] OR [facility];  Service: Ophthalmology    SKIN BIOPSY      VASECTOMY         Family History   Problem Relation Age of Onset    Kidney disease Mother  Diabetes Mother     Hypertension Mother     Kidney disease Father     Cancer Father     Ulcers Father     Kidney disease Maternal Grandmother     Diabetes Maternal Grandmother     Glaucoma Neg Hx     Macular degeneration Neg Hx     Strabismus Neg Hx        Social History     Tobacco Use    Smoking status: Never     Passive exposure: Never    Smokeless tobacco: Never   Vaping Use    Vaping Use: Never used   Substance Use Topics    Alcohol use: No    Drug use: No             Objective:       Vital Signs  BP 120/62 (BP Site: L Arm, BP Position: Sitting, BP Cuff Size: Medium)  - Pulse 69  - Wt (!) 109.4 kg (241 lb 1.6 oz)  - SpO2 98%  - BMI 31.82 kg/m??      Exam  Physical Exam:    Well developed, well nourished male in no acute distress.      Vitals:    04/16/22 1108   BP: 120/62   Pulse: 69   SpO2: 98%     LUNGS:  CTA  CARDIOVASCULAR:  RRR without murmurs, or gallops, or rubs.  Pulses 2+ bilaterally and symmetric.    ABDOMEN:  NABS/ NT/ND/soft.  NO hsm nor abdominal masses.    EXTREMITIES:  No c/c/e.  No ulcers or abrasions.

## 2022-04-16 NOTE — Unmapped (Signed)
Tolerating coumadin well without bleeding.  Check INR today.    REmains on toprol and well rate controlled.

## 2022-04-16 NOTE — Unmapped (Signed)
BP well controlled on current therapy.

## 2022-04-16 NOTE — Unmapped (Signed)
Doing well with medical therapy through Dr. Tiburcio Pea.

## 2022-04-16 NOTE — Unmapped (Signed)
REmains on [facility] for this and afib.    TOlerating well.

## 2022-04-17 DIAGNOSIS — Z940 Kidney transplant status: Principal | ICD-10-CM

## 2022-04-17 LAB — TACROLIMUS LEVEL, TIMED: TACROLIMUS BLOOD: 7.7 ng/mL

## 2022-04-17 MED ORDER — TACROLIMUS 1 MG CAPSULE, IMMEDIATE-RELEASE
ORAL_CAPSULE | ORAL | 3 refills | 90 days | Status: CP
Start: 2022-04-17 — End: ?
  Filled 2022-04-20: qty 150, 30d supply, fill #0

## 2022-04-17 NOTE — Unmapped (Signed)
[facility] Refill Coordination Note    Specialty Medication(s) to be Shipped:   Transplant: Myfortic 180mg  and tacrolimus 1mg     Other medication(s) to be shipped: No additional medications requested for fill at this time     David Ellis, DOB: 04-16-1946  Phone: [phone] (home)       All above HIPAA information was verified with patient.     Was a Nurse, learning disability used for this call? No    Completed refill call assessment today to schedule patient's medication shipment from the [facility] [phone]).  All relevant notes have been reviewed.     Specialty medication(s) and dose(s) confirmed: Regimen is correct and unchanged.   Changes to medications: David Ellis reports no changes at this time.  Changes to insurance: No  New side effects reported not previously addressed with a pharmacist or physician: None reported  Questions for the pharmacist: No    Confirmed patient received a Conservation officer, historic buildings and a Surveyor, mining with first shipment. The patient will receive a drug information handout for each medication shipped and additional FDA Medication Guides as required.       DISEASE/MEDICATION-SPECIFIC INFORMATION        N/A    SPECIALTY MEDICATION ADHERENCE     Medication Adherence    Patient reported X missed doses in the last month: 0  Specialty Medication: MYFORTIC 180 MG EC tablet (mycophenolate)  Patient is on additional specialty medications: Yes  Additional Specialty Medications: tacrolimus 1 MG capsule (PROGRAF)  Patient Reported Additional Medication X Missed Doses in the Last Month: 0  Patient is on more than two specialty medications: No      Adherence tools used: patient uses a pill box to manage medications      Support network for adherence: family member                    Were doses missed due to medication being on hold? No    MYFORTIC 180  mg: 5 days of medicine on hand   tacrolimus 1 mg: 5 days of medicine on hand       REFERRAL TO PHARMACIST Referral to the pharmacist: Not needed      [facility]     Shipping address confirmed in Epic.     Delivery Scheduled: Yes, Expected medication delivery date: 04/20/22.     Medication will be delivered via Same Day Courier to the prescription address in Epic WAM.    David Ellis   [facility] Specialty Technician

## 2022-04-18 ENCOUNTER — Other Ambulatory Visit: Payer: Self-pay | Admitting: Medical

## 2022-04-18 ENCOUNTER — Ambulatory Visit
Admission: RE | Admit: 2022-04-18 | Discharge: 2022-04-18 | Disposition: A | Discharge status: Home / Self Care | Payer: Medicare Other | Source: Ambulatory Visit | Attending: Medical | Admitting: Medical

## 2022-04-18 DIAGNOSIS — M25551 Pain in right hip: Secondary | ICD-10-CM

## 2022-04-20 MED FILL — MYFORTIC 180 MG TABLET,DELAYED RELEASE: ORAL | 30 days supply | Qty: 180 | Fill #5

## 2022-04-24 ENCOUNTER — Ambulatory Visit (INDEPENDENT_AMBULATORY_CARE_PROVIDER_SITE_OTHER): Payer: Medicare Other | Admitting: Dermatology

## 2022-04-24 DIAGNOSIS — L578 Other skin changes due to chronic exposure to nonionizing radiation: Secondary | ICD-10-CM

## 2022-04-24 DIAGNOSIS — L821 Other seborrheic keratosis: Secondary | ICD-10-CM

## 2022-04-24 DIAGNOSIS — Z85828 Personal history of other malignant neoplasm of skin: Secondary | ICD-10-CM

## 2022-04-24 DIAGNOSIS — L814 Other melanin hyperpigmentation: Secondary | ICD-10-CM

## 2022-04-24 DIAGNOSIS — L853 Xerosis cutis: Secondary | ICD-10-CM

## 2022-04-24 DIAGNOSIS — L820 Inflamed seborrheic keratosis: Secondary | ICD-10-CM | POA: Diagnosis not present

## 2022-04-24 DIAGNOSIS — Z1283 Encounter for screening for malignant neoplasm of skin: Secondary | ICD-10-CM

## 2022-04-24 DIAGNOSIS — L219 Seborrheic dermatitis, unspecified: Secondary | ICD-10-CM | POA: Diagnosis not present

## 2022-04-24 DIAGNOSIS — D692 Other nonthrombocytopenic purpura: Secondary | ICD-10-CM

## 2022-04-24 MED ORDER — KETOCONAZOLE 2 % EX CREA: TOPICAL_CREAM | CUTANEOUS | 5 refills | Status: DC

## 2022-04-24 MED ORDER — HYDROCORTISONE 2.5 % EX CREA: TOPICAL_CREAM | CUTANEOUS | 5 refills | Status: DC

## 2022-04-24 NOTE — Progress Notes (Signed)
Follow-Up Visit   Subjective  Larry Bright is a 76 y.o. male who presents for the following: Follow-up.  The patient presents for Upper Body Skin Exam (UBSE) for skin cancer screening and mole check.  The patient has spots, moles and lesions to be evaluated, some may be new or changing. He has a history of BCCs and SCCs.   Patient accompanied by wife.   The following portions of the chart were reviewed this encounter and updated as appropriate:       Review of Systems:  No other skin or systemic complaints except as noted in HPI or Assessment and Plan.  Objective  Well appearing patient in no apparent distress; mood and affect are within normal limits.  All skin waist up examined.  face Pink patches with greasy scale.   Right Upper Lip Indistinct slightly depressed pink/flesh scaly macule.    Assessment & Plan  Skin cancer screening performed today.  Actinic Damage - chronic, secondary to cumulative UV radiation exposure/sun exposure over time - diffuse scaly erythematous macules with underlying dyspigmentation - Recommend daily broad spectrum sunscreen SPF 30+ to sun-exposed areas, reapply every 2 hours as needed.  - Recommend staying in the shade or wearing long sleeves, sun glasses (UVA+UVB protection) and wide brim hats (4-inch brim around the entire circumference of the hat). - Call for new or changing lesions.  History of Basal Cell Carcinoma of the Skin - No evidence of recurrence today left med cheek, right nasal dorsum  - Recommend regular full body skin exams - Recommend daily broad spectrum sunscreen SPF 30+ to sun-exposed areas, reapply every 2 hours as needed.  - Call if any new or changing lesions are noted between office visits  History of Squamous Cell Carcinoma of the Skin - No evidence of recurrence today of the Left Forearm, Left Earlobe, Left Antihelix  - Recommend regular full body skin exams - Recommend daily broad spectrum sunscreen SPF  30+ to sun-exposed areas, reapply every 2 hours as needed.  - Call if any new or changing lesions are noted between office visits  Seborrheic Keratoses - Stuck-on, waxy, tan-brown papules and/or plaques  - Benign-appearing - Discussed benign etiology and prognosis. - Observe - Call for any changes  Lentigines - Scattered tan macules - Due to sun exposure - Benign-appearing, observe - Recommend daily broad spectrum sunscreen SPF 30+ to sun-exposed areas, reapply every 2 hours as needed. - Call for any changes  Purpura - Chronic; persistent and recurrent.  Treatable, but not curable. - Violaceous macules and patches - Benign - Related to trauma, age, sun damage and/or use of blood thinners, chronic use of topical and/or oral steroids - Observe - Can use OTC arnica containing moisturizer such as Dermend Bruise Formula if desired - Call for worsening or other concerns  Seborrheic dermatitis face  Chronic and persistent condition with duration or expected duration over one year. Condition is symptomatic / bothersome to patient. Not to goal.  Seborrheic Dermatitis  -  is a chronic persistent rash characterized by pinkness and scaling most commonly of the mid face but also can occur on the scalp (dandruff), ears; mid chest, mid back and groin.  It tends to be exacerbated by stress and cooler weather.  People who have neurologic disease may experience new onset or exacerbation of existing seborrheic dermatitis.  The condition is not curable but treatable and can be controlled.  Start ketoconazole 2% cream Apply QD/BID as directed Start hydrocortisone 2.5% cream Apply  QD/BID as directed  ketoconazole (NIZORAL) 2 % cream - face Apply to affected areas face/ears once to twice daily as directed.  hydrocortisone 2.5 % cream - face Apply to affected areas face once to twice daily as directed.  Inflamed seborrheic keratosis Right Upper Lip  vs AK.  Recheck on follow-up.  Patient  states this was location of skin cancer treated years ago. Tender to touch. If not clear, consider bx  Destruction of lesion - Right Upper Lip  Destruction method: cryotherapy   Informed consent: discussed and consent obtained   Lesion destroyed using liquid nitrogen: Yes   Region frozen until ice ball extended beyond lesion: Yes   Outcome: patient tolerated procedure well with no complications   Post-procedure details: wound care instructions given   Additional details:  Prior to procedure, discussed risks of blister formation, small wound, skin dyspigmentation, or rare scar following cryotherapy. Recommend Vaseline ointment to treated areas while healing.   Xerosis - diffuse xerotic patches - recommend gentle, hydrating skin care - gentle skin care handout given  Return in about 6 months (around 10/23/2022) for Hx AKs, Hx SCC, Hx BCC.  IJamesetta Orleans, CMA, am acting as scribe for Brendolyn Patty, MD .  Documentation: I have reviewed the above documentation for accuracy and completeness, and I agree with the above.  Brendolyn Patty MD

## 2022-04-24 NOTE — Patient Instructions (Addendum)
Cryotherapy Aftercare  Wash gently with soap and water everyday.   Apply Vaseline and Band-Aid daily until healed.   Seborrheic Dermatitis  Apply to pink, dry, scaly areas of the face/ears.  Mix hydrocortisone with ketaconazole 2% twice a day. If improved, decrease to hydrocortisone and ketaconazole mixed once a day. If still clear, decrease to ketaconazole only.  Due to recent changes in healthcare laws, you may see results of your pathology and/or laboratory studies on MyChart before the doctors have had a chance to review them. We understand that in some cases there may be results that are confusing or concerning to you. Please understand that not all results are received at the same time and often the doctors may need to interpret multiple results in order to provide you with the best plan of care or course of treatment. Therefore, we ask that you please give [place] 2 business days to thoroughly review all your results before contacting the office for clarification. Should we see a critical lab result, you will be contacted sooner.   If You Need Anything After Your Visit  If you have any questions or concerns for your doctor, please call our main line at [phone] and press option 4 to reach your doctor's medical assistant. If no one answers, please leave a voicemail as directed and we will return your call as soon as possible. Messages left after 4 pm will be answered the following business day.   You may also send [place] a message via Industry. We typically respond to MyChart messages within 1-2 business days.  For prescription refills, please ask your pharmacy to contact our office. Our fax number is [phone].  If you have an urgent issue when the clinic is closed that cannot wait until the next business day, you can page your doctor at the number below.    Please note that while we do our best to be available for urgent issues outside of office hours, we are not available 24/7.   If  you have an urgent issue and are unable to reach [place], you may choose to seek medical care at your doctor's office, retail clinic, urgent care center, or emergency room.  If you have a medical emergency, please immediately call 911 or go to the emergency department.  Pager Numbers  - Dr. Nehemiah Massed: [phone]  - Dr. Laurence Ferrari: [phone]  - Dr. Nicole Kindred: [phone]  In the event of inclement weather, please call our main line at [phone] for an update on the status of any delays or closures.  Dermatology Medication Tips: Please keep the boxes that topical medications come in in order to help keep track of the instructions about where and how to use these. Pharmacies typically print the medication instructions only on the boxes and not directly on the medication tubes.   If your medication is too expensive, please contact our office at [phone] option 4 or send [place] a message through [place].   We are unable to tell what your co-pay for medications will be in advance as this is different depending on your insurance coverage. However, we may be able to find a substitute medication at lower cost or fill out paperwork to get insurance to cover a needed medication.   If a prior authorization is required to get your medication covered by your insurance company, please allow [place] 1-2 business days to complete this process.  Drug prices often vary depending on where the prescription is filled and some pharmacies may offer cheaper prices.  The website www.goodrx.com contains coupons for medications through different pharmacies. The prices here do not account for what the cost may be with help from insurance (it may be cheaper with your insurance), but the website can give you the price if you did not use any insurance.  - You can print the associated coupon and take it with your prescription to the pharmacy.  - You may also stop by our office during regular business hours and pick up a GoodRx  coupon card.  - If you need your prescription sent electronically to a different pharmacy, notify our office through [facility] or by phone at [phone] option 4.     Si Usted Necesita Algo Despus de Su Visita  Tambin puede enviarnos un mensaje a travs de Pharmacist, community. Por lo general respondemos a los mensajes de MyChart en el transcurso de 1 a 2 das hbiles.  Para renovar recetas, por favor pida a su farmacia que se ponga en contacto con nuestra oficina. Harland Dingwall de fax es [place] [phone].  Si tiene un asunto urgente cuando la clnica est cerrada y que no puede esperar hasta el siguiente da hbil, puede llamar/localizar a su doctor(a) al nmero que aparece a continuacin.   Por favor, tenga en cuenta que aunque hacemos todo lo posible para estar disponibles para asuntos urgentes fuera del horario de [place], no estamos disponibles las 24 horas del da, los 7 das de la [place].   Si tiene un problema urgente y no puede comunicarse con nosotros, puede optar por buscar atencin mdica  en el consultorio de su doctor(a), en una clnica privada, en un centro de atencin urgente o en una sala de emergencias.  Si tiene Engineering geologist, por favor llame inmediatamente al 911 o vaya a la sala de emergencias.  Nmeros de bper  - Dr. Nehemiah Massed: [phone]  - Dra. Moye: [phone]  - Dra. Nicole Kindred: [phone]  En caso de inclemencias del [place], por favor llame a Johnsie Kindred principal al [phone] para una actualizacin sobre el [place] de cualquier retraso o cierre.  Consejos para la medicacin en dermatologa: Por favor, guarde las cajas en las que vienen los medicamentos de uso tpico para ayudarle a seguir las instrucciones sobre dnde y cmo usarlos. Las farmacias generalmente imprimen las instrucciones del medicamento slo en las cajas y no directamente en los tubos del [place].   Si su medicamento es muy caro, por favor, pngase en contacto con  Zigmund Daniel llamando al [phone] y presione la opcin 4 o envenos un mensaje a travs de Pharmacist, community.   No podemos decirle cul ser su copago por los medicamentos por adelantado ya que esto es diferente dependiendo de la cobertura de su seguro. Sin embargo, es posible que podamos encontrar un medicamento sustituto a Electrical engineer un formulario para que el seguro cubra el medicamento que se considera necesario.   Si se requiere una autorizacin previa para que su compaa de seguros [place] su medicamento, por favor permtanos de 1 a 2 das hbiles para completar este proceso.  Los precios de los medicamentos varan con frecuencia dependiendo del Environmental consultant de dnde se surte la receta y alguna farmacias pueden ofrecer precios ms baratos.  El sitio web www.goodrx.com tiene cupones para medicamentos de Airline pilot. Los precios aqu no tienen en cuenta lo que podra costar con la ayuda del seguro (puede ser ms barato con su seguro), pero el sitio web puede darle el precio si no utiliz Research scientist (physical sciences).  -  imprimir el cupn correspondiente y llevarlo con su receta a la farmacia.  - Tambin puede pasar por nuestra oficina durante el horario de atencin regular y recoger una tarjeta de cupones de GoodRx.  - Si necesita que su receta se enve electrnicamente a una farmacia diferente, informe a nuestra oficina a travs de MyChart de [place] o por telfono llamando al [phone] y presione la opcin 4.  

## 2022-04-30 DIAGNOSIS — E875 Hyperkalemia: Principal | ICD-10-CM

## 2022-05-03 NOTE — Unmapped (Signed)
PCP:  David Blew, MD      ASSESSMENT/PLAN: [name] is a 76 y.o. 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    ## S/P LURD kidney transplant 2013 with usual baseline creatinine ~1.6-2.0, but running lower more recently.  Has a history of a DQ 7 donor specific antibody noted on 09/17/2011 with all subsequent HLA antibody screens revealing no DSA's.     ## High risk medication monitoring: Tac troughs generally running at goal 4-6. Most recent was up to 7.7, but this may not have been a true trough as it was drawn at 11:30am. Continue Prograf 3 mg qAM, 2 mg qPM. Continue monthly tacrolimus trough levels, potassium and creatinine for toxicity monitoring.    ## Hyperkalemia: Potassium 4.6 today 05/08/22.  Counseled to avoid high potassium foods. He agreed that if it rises again to >5.5 on a routine basis, he would prefer to add a potassium binder rather than to stop his losartan which is nephroprotective in the setting of diabetes and albuminuria.     ## Stage A2G3a CKD:  Has persistent albuminuria so continue losartan 25 mg daily and Jardiance 10mg  daily. Below his baseline creatinine on 04/16/22.     ## HTN: Well controlled - BP in clinic today 137/70. Continue metoprolol, losartan and lasix.    ## T2DM: HbA1c 7.6% in October followed by David Ellis in endocrinology - currently on Trulicity, insulin, Evaristo Bury and Novolog today as per endocrinology. May be getting small benefit from Jardiance with respect to glycemic control, but this is primarily prescribed for cardiorenal benefits. History of proliferative retinopathy s/p PPV/endolaser treatments, last evaluated by ophthalmology in 08/2021.     ## ASHD/CVA history. Continue pravastatin and ASA.     ## A-Fib. Continue warfarin. INR f/b David Ellis. Have previously discussed possibility of transitioning to DOAC, but he feels since he has to get blood drawn monthly anyways, he may as well stay on the warfarin.    ## HM: Had his covid vaccinations and is UTD on flu, pneumonia and shingrix. Colonoscopy 02/2022. Continue biannual follow-up with dermatology re: recurrent basal cell CA. Given high risk for severe COVID19 infection, he received Evusheld injections on 06/28/20, 08/26/20, 02/28/21.    ## High falls risk: Discussed importance of utilizing walker and cane whenever possible and will send referral for physical therapy for balance training.    [name] will follow up with David Ellis in March for his annual post-transplant evaluation and then will see me 3 months after that.     David Agrusa K. Abdulmalik Darco, MD  Nephrology and Hypertension  [email or web address]  [phone]  __________________________________________________________________________    HPI: David Ellis is a 76 y.o. man s/p LURD kidney transplant (from his wife, David Ellis) 08/21/2011 and with a B/L creatinine ~1.4-1.7mg /dl. 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     He has had multiple complications since his transplant including a large, provoked (long car ride) pulmonary embolus requiring thrombolysis (Dec 2014), osteomyelitis of his left foot requiring 4th/5th toe amputation (2016), recurrent basal cell CA, CVA s/p thombolysis (07/2015). He  has significant PAD and neuropathy and has no sensation on either leg below his knees. He also has severe retinopathy and his vision is quite poor so no longer drives.     Interval history:   Since last visit on 10/2021 David Ellis underwent a colonoscopy on 02/27/2022. His shoulder has finally healed up after sustaining a right proximal humerus fracture last Feb complicated by adhesive capsulitis.    He has been taking a half dose of jardiance for the past 3 weeks due to dizziness. He has not be walking much recently but is planning to increase the frequency. Overall he has been doing well. He was advised to have PT to help with balance and is asking for a referral for this.    PAST MEDICAL HISTORY:  Past Medical History:   Diagnosis Date    Arthritis     Atrial flutter (CMS-HCC)     Cataract     Diabetes mellitus (CMS-HCC)     Diabetic nephropathy (CMS-HCC)     Diabetic retinopathy (CMS-HCC)     Fractures     Ganglion cyst     Hand injury     Heart disease     Hyperlipidemia     Hypertension     Impaired mobility     Joint pain     Lack of access to transportation     Osteomyelitis (CMS-HCC) 11/2014    Pulmonary embolism (CMS-HCC) 05/2014    Retinopathy due to secondary diabetes (CMS-HCC) 01/2013    Squamous cell skin cancer 11/2013    Stroke (CMS-HCC)     Tear of meniscus of knee     Transplanted kidney 08/21/2011       ALLERGIES  Egg derived; Enalapril; Epinephrine; Grass pollen-bermuda, standard; Levofloxacin; Lisinopril; Mepivacaine hcl; and Penicillins    MEDICATIONS:  Current Outpatient Medications   Medication Sig Dispense Refill    hydrocortisone 2.5 % cream       ketoconazole (NIZORAL) 2 % cream       acetaminophen (TYLENOL) 325 MG tablet Take by mouth.      aspirin (ECOTRIN) 81 MG tablet Take 1 tablet (81 mg total) by mouth daily as needed (when flying). (Patient not taking: Reported on 04/16/2022)      BD ULTRA-FINE NANO PEN NEEDLE 32 gauge x 5/32 (4 mm) Ndle USE 3 TIMES DAILY BEFORE MEALS 300 each 3    blood-glucose meter kit Use as instructed. 1 each 0    cholecalciferol, vitamin D3-50 mcg, 2,000 unit,, 50 mcg (2,000 unit) tablet Take 1 tablet (50 mcg total) by mouth daily.      empagliflozin (JARDIANCE) 10 mg tablet Take 1 tablet (10 mg total) by mouth every morning. 90 tablet 3    flash glucose sensor (FREESTYLE LIBRE 14 DAY SENSOR) kit by Other route every fourteen (14) days. 2 each 11    furosemide (LASIX) 40 MG tablet Take 1 tablet (40 mg total) by mouth daily. 40 mg daily. And 1 additional tablet for edema.      gabapentin (NEURONTIN) 300 MG capsule TAKE 1 CAPSULE BY MOUTH TWICE DAILY AS DIRECTED (Patient taking differently: Take 1 capsule (300 mg total) by mouth two (2) times a day.) 180 capsule 3    HUMALOG KWIKPEN INSULIN 100 unit/mL injection pen INJECT 15-25 UNITS WITH MEALS + CORRECTION **MAX 100 UNITS A DAY** 60 mL 3    insulin degludec 200 unit/mL (3 mL) InPn Inject 0.4 mL (80 Units total) under the skin daily. Titrate up to 100units daily  as instructed. 45 mL 3    losartan (COZAAR) 25 MG tablet Take 1 tablet (25 mg total) by mouth daily. 90 tablet 3    magnesium chloride (SLOW_MAG) 64 mg TbEC Take 128 mg by mouth three (3) times a day (at 6am, noon and 6pm). (2 tablets) 9am 1 pm and 9 pm      metoPROLOL succinate (TOPROL-XL) 100 MG 24 hr tablet TAKE ONE TABLET TWICE DAILY 180 tablet 1    miscellaneous medical supply Misc 1 application by Miscellaneous route daily as needed. Lightweight wheelchair. 1 each 0    miscellaneous medical supply Misc 1 application by Miscellaneous route nightly. Recommend CPAP 14 cm H2O with EPR 3 and heated humidifier for nasal dryness, mask: ResMed Activa Lt nasal mask (Large). 1 each 0    multivitamin capsule Take 1 capsule by mouth daily.      MYFORTIC 180 mg EC tablet Take 3 tablets (540 mg total) by mouth two (2) times a day. 540 tablet 3    pravastatin (PRAVACHOL) 20 MG tablet TAKE ONE TABLET BY MOUTH EVERY DAY 90 tablet 3    tacrolimus (PROGRAF) 1 MG capsule Take 3 capsules (3 mg total) by mouth in the morning AND 2 capsules (2 mg total) in the evening. 450 capsule 3    TRULICITY 1.5 mg/0.5 mL PnIj INJECT 1.5MG  (0.5ML) SUBCUTANEOUSLY ONCEA WEEK 2 mL 11    warfarin (JANTOVEN) 5 MG tablet TAKE 1 TABLET BY MOUTH DAILY WITH EVENING MEAL. TAKE 6 DAYS A WEEK (TUESDAY THROUGH SUNDAY) AS DIRECTED. 90 tablet 0     No current facility-administered medications for this visit.     Facility-Administered Medications Ordered in Other Visits   Medication Dose Route Frequency Provider Last Rate Last Admin    bevacizumab    PRN (once a day) Melvyn Novas, MD   2.5 mg at 01/28/17 0750     PHYSICAL EXAM:  Vitals:    05/08/22 1014   BP: 137/70   Pulse: 75   Temp: 36.3 ??C (97.4 ??F)     CONSTITUTIONAL: Alert,well appearing, no distress  HEENT: Moist mucous membranes, oropharynx clear without erythema or exudate  NECK: Supple, no lymphadenopathy  CARDIOVASCULAR: Regular, normal S1/S2 heart sounds, no murmurs, no rubs.   PULM: Clear to auscultation bilaterally  GASTROINTESTINAL: Soft, active bowel sounds, nontender  EXTREMITIES: Trace B/L woody edema bilaterally  SKIN: No rashes     MEDICAL DECISION MAKING  Lab Results   Component Value Date    NA 143 04/16/2022    K 4.6 05/08/2022    CO2 30.0 04/16/2022    BUN 25 (H) 04/16/2022    CREATININE 1.27 (H) 04/16/2022    CALCIUM 9.2 04/16/2022    PHOS 3.9 04/02/2022     Lab Results   Component Value Date    WBC 7.8 04/16/2022    HGB 14.7 04/16/2022    PLT 211 04/16/2022     Lab Results   Component Value Date    TACROLIMUS 7.7 04/16/2022     Lab Results   Component Value Date    A1C 7.6 (H) 03/28/2022       Scribe's Attestation: Trey Sailors, MD obtained and performed the history, physical exam and medical decision making elements that were entered into the chart. Signed by Allie Dimmer, Scribe, on May 08, 2022 11:31 AM.     Attending Statement: Documentation assistance provided by the Scribe. I was present during the time the encounter was recorded. The information recorded by  the Scribe was done at my direction and has been reviewed and validated by me.

## 2022-05-08 ENCOUNTER — Ambulatory Visit: Admit: 2022-05-08 | Discharge: 2022-05-09 | Payer: MEDICARE

## 2022-05-08 DIAGNOSIS — R2689 Other abnormalities of gait and mobility: Principal | ICD-10-CM

## 2022-05-08 DIAGNOSIS — E875 Hyperkalemia: Principal | ICD-10-CM

## 2022-05-08 LAB — POTASSIUM: POTASSIUM: 4.6 mmol/L (ref 3.4–4.8)

## 2022-05-10 NOTE — Unmapped (Signed)
[facility] Refill Coordination Note    Specialty Medication(s) to be Shipped:   Transplant: Myfortic 180mg  and tacrolimus 1mg  pt is going out of town    Other medication(s) to be shipped: No additional medications requested for fill at this time     David Ellis, DOB: March 02, 1946  Phone: [phone] (home)       All above HIPAA information was verified with patient.     Was a Nurse, learning disability used for this call? No    Completed refill call assessment today to schedule patient's medication shipment from the [facility] [phone]).  All relevant notes have been reviewed.     Specialty medication(s) and dose(s) confirmed: Regimen is correct and unchanged.   Changes to medications: David Ellis reports no changes at this time.  Changes to insurance: No  New side effects reported not previously addressed with a pharmacist or physician: None reported  Questions for the pharmacist: No    Confirmed patient received a Conservation officer, historic buildings and a Surveyor, mining with first shipment. The patient will receive a drug information handout for each medication shipped and additional FDA Medication Guides as required.       DISEASE/MEDICATION-SPECIFIC INFORMATION        N/A    SPECIALTY MEDICATION ADHERENCE     Medication Adherence    Patient reported X missed doses in the last month: 0  Specialty Medication: MYFORTIC 180 MG EC tablet (mycophenolate)  Patient is on additional specialty medications: Yes  Additional Specialty Medications: tacrolimus 1 MG capsule (PROGRAF)  Patient Reported Additional Medication X Missed Doses in the Last Month: 0  Patient is on more than two specialty medications: No      Adherence tools used: patient uses a pill box to manage medications      Support network for adherence: family member                    Were doses missed due to medication being on hold? No    MYFORTIC 180  mg: 5 days of medicine on hand   tacrolimus 1 mg: 5 days of medicine on hand REFERRAL TO PHARMACIST     Referral to the pharmacist: Not needed      [facility]     Shipping address confirmed in Epic.     Delivery Scheduled: Yes, Expected medication delivery date: 05/11/22.     Medication will be delivered via Same Day Courier to the prescription address in Epic WAM.    [name]   [facility] Specialty Technician

## 2022-05-11 MED FILL — TACROLIMUS 1 MG CAPSULE, IMMEDIATE-RELEASE: ORAL | 30 days supply | Qty: 150 | Fill #1

## 2022-05-11 MED FILL — MYFORTIC 180 MG TABLET,DELAYED RELEASE: ORAL | 30 days supply | Qty: 180 | Fill #6

## 2022-05-17 MED ORDER — WARFARIN 5 MG TABLET
ORAL_TABLET | 0 refills | 0 days | Status: CP
Start: 2022-05-17 — End: ?

## 2022-05-17 NOTE — Unmapped (Signed)
Patient is requesting the following refill  Requested Prescriptions     Pending Prescriptions Disp Refills    warfarin (JANTOVEN) 5 MG tablet [Pharmacy Med Name: WARFARIN SODIUM 5 MG TAB] 90 tablet 0     Sig: TAKE 1 TABLET BY MOUTH DAILY WITH EVENING MEAL. TAKE 6 DAYS A WEEK (TUESDAY THROUGH SUNDAY) AS DIRECTED.       Order pended. Please advise. Thanks    Last OV: 04/16/2022   Next OV: 10/15/2022

## 2022-05-22 ENCOUNTER — Ambulatory Visit
Admit: 2022-05-22 | Discharge: 2022-05-22 | Disposition: A | Discharge status: Home / Self Care | Payer: MEDICARE | Attending: Emergency Medicine

## 2022-05-22 ENCOUNTER — Emergency Department
Admit: 2022-05-22 | Discharge: 2022-05-22 | Disposition: A | Discharge status: Home / Self Care | Payer: MEDICARE | Attending: Emergency Medicine

## 2022-05-22 DIAGNOSIS — Z940 Kidney transplant status: Principal | ICD-10-CM

## 2022-05-22 DIAGNOSIS — E875 Hyperkalemia: Principal | ICD-10-CM

## 2022-05-22 DIAGNOSIS — Z0189 Encounter for other specified special examinations: Principal | ICD-10-CM

## 2022-05-22 LAB — PROTIME-INR
INR: 2.86
INR: 2.94
PROTIME: 30.8 s — ABNORMAL HIGH (ref 9.9–12.6)
PROTIME: 31.6 s — ABNORMAL HIGH (ref 9.9–12.6)

## 2022-05-22 LAB — CBC W/ AUTO DIFF
BASOPHILS ABSOLUTE COUNT: 0 10*9/L (ref 0.0–0.1)
BASOPHILS ABSOLUTE COUNT: 0 10*9/L (ref 0.0–0.1)
BASOPHILS RELATIVE PERCENT: 0.4 %
BASOPHILS RELATIVE PERCENT: 0.6 %
EOSINOPHILS ABSOLUTE COUNT: 0.1 10*9/L (ref 0.0–0.5)
EOSINOPHILS ABSOLUTE COUNT: 0.1 10*9/L (ref 0.0–0.5)
EOSINOPHILS RELATIVE PERCENT: 0.7 %
EOSINOPHILS RELATIVE PERCENT: 1.5 %
HEMATOCRIT: 44.8 % (ref 39.0–48.0)
HEMATOCRIT: 44.5 % (ref 39.0–48.0)
HEMOGLOBIN: 15 g/dL (ref 12.9–16.5)
HEMOGLOBIN: 14.8 g/dL (ref 12.9–16.5)
LYMPHOCYTES ABSOLUTE COUNT: 0.9 10*9/L — ABNORMAL LOW (ref 1.1–3.6)
LYMPHOCYTES ABSOLUTE COUNT: 1.3 10*9/L (ref 1.1–3.6)
LYMPHOCYTES RELATIVE PERCENT: 9.1 %
LYMPHOCYTES RELATIVE PERCENT: 16.8 %
MEAN CORPUSCULAR HEMOGLOBIN CONC: 33.4 g/dL (ref 32.0–36.0)
MEAN CORPUSCULAR HEMOGLOBIN CONC: 33.1 g/dL (ref 32.0–36.0)
MEAN CORPUSCULAR HEMOGLOBIN: 28.1 pg (ref 25.9–32.4)
MEAN CORPUSCULAR HEMOGLOBIN: 28.1 pg (ref 25.9–32.4)
MEAN CORPUSCULAR VOLUME: 84.3 fL (ref 77.6–95.7)
MEAN CORPUSCULAR VOLUME: 84.7 fL (ref 77.6–95.7)
MEAN PLATELET VOLUME: 7.6 fL (ref 6.8–10.7)
MEAN PLATELET VOLUME: 7.6 fL (ref 6.8–10.7)
MONOCYTES ABSOLUTE COUNT: 0.6 10*9/L (ref 0.3–0.8)
MONOCYTES ABSOLUTE COUNT: 0.6 10*9/L (ref 0.3–0.8)
MONOCYTES RELATIVE PERCENT: 6.1 %
MONOCYTES RELATIVE PERCENT: 7.6 %
NEUTROPHILS ABSOLUTE COUNT: 8.4 10*9/L — ABNORMAL HIGH (ref 1.8–7.8)
NEUTROPHILS ABSOLUTE COUNT: 5.5 10*9/L (ref 1.8–7.8)
NEUTROPHILS RELATIVE PERCENT: 83.7 %
NEUTROPHILS RELATIVE PERCENT: 73.5 %
NUCLEATED RED BLOOD CELLS: 0 /100{WBCs} (ref <=4)
NUCLEATED RED BLOOD CELLS: 0 /100{WBCs} (ref <=4)
PLATELET COUNT: 198 10*9/L (ref 150–450)
PLATELET COUNT: 206 10*9/L (ref 150–450)
RED BLOOD CELL COUNT: 5.32 10*12/L (ref 4.26–5.60)
RED BLOOD CELL COUNT: 5.26 10*12/L (ref 4.26–5.60)
RED CELL DISTRIBUTION WIDTH: 14.1 % (ref 12.2–15.2)
RED CELL DISTRIBUTION WIDTH: 14.2 % (ref 12.2–15.2)
WBC ADJUSTED: 10.1 10*9/L (ref 3.6–11.2)
WBC ADJUSTED: 7.5 10*9/L (ref 3.6–11.2)

## 2022-05-22 LAB — COMPREHENSIVE METABOLIC PANEL
ALBUMIN: 3.6 g/dL (ref 3.4–5.0)
ALKALINE PHOSPHATASE: 91 U/L (ref 46–116)
ALT (SGPT): 16 U/L (ref 10–49)
ANION GAP: 6 mmol/L (ref 5–14)
AST (SGOT): 23 U/L (ref <=34)
BILIRUBIN TOTAL: 0.4 mg/dL (ref 0.3–1.2)
BLOOD UREA NITROGEN: 22 mg/dL (ref 9–23)
BUN / CREAT RATIO: 18
CALCIUM: 9.3 mg/dL (ref 8.7–10.4)
CHLORIDE: 105 mmol/L (ref 98–107)
CO2: 28.8 mmol/L (ref 20.0–31.0)
CREATININE: 1.24 mg/dL — ABNORMAL HIGH
EGFR CKD-EPI (2021) MALE: 61 mL/min/{1.73_m2} (ref >=60)
GLUCOSE RANDOM: 242 mg/dL — ABNORMAL HIGH (ref 70–179)
POTASSIUM: 4.4 mmol/L (ref 3.4–4.8)
PROTEIN TOTAL: 6.9 g/dL (ref 5.7–8.2)
SODIUM: 140 mmol/L (ref 135–145)

## 2022-05-22 LAB — BASIC METABOLIC PANEL
ANION GAP: 4 mmol/L — ABNORMAL LOW (ref 5–14)
BLOOD UREA NITROGEN: 23 mg/dL (ref 9–23)
BUN / CREAT RATIO: 18
CALCIUM: 9.8 mg/dL (ref 8.7–10.4)
CHLORIDE: 109 mmol/L — ABNORMAL HIGH (ref 98–107)
CO2: 29.3 mmol/L (ref 20.0–31.0)
CREATININE: 1.3 mg/dL — ABNORMAL HIGH
EGFR CKD-EPI (2021) MALE: 58 mL/min/{1.73_m2} — ABNORMAL LOW (ref >=60)
GLUCOSE RANDOM: 120 mg/dL (ref 70–179)
POTASSIUM: 6.3 mmol/L (ref 3.4–4.8)
SODIUM: 142 mmol/L (ref 135–145)

## 2022-05-22 LAB — TACROLIMUS LEVEL: TACROLIMUS BLOOD: 4.5 ng/mL

## 2022-05-22 LAB — PHOSPHORUS: PHOSPHORUS: 3.7 mg/dL (ref 2.4–5.1)

## 2022-05-22 LAB — MAGNESIUM
MAGNESIUM: 2.1 mg/dL (ref 1.6–2.6)
MAGNESIUM: 2 mg/dL (ref 1.6–2.6)

## 2022-05-22 MED ORDER — LOKELMA 10 GRAM ORAL POWDER PACKET
PACK | ORAL | 0 refills | 0.00000 days | Status: CP
Start: 2022-05-22 — End: 2022-05-22

## 2022-05-22 NOTE — Unmapped (Signed)
Initial pharmacy did not have Lokelma. After calling multiple pharmacies, I located it at [facility]. Prescription sent there- now requiring PA when it's run by pharmacist. Pt advised to go to the ER for treatment today. Educated on potential for serious issues such as cardiac arrhythmias with high potassium levels. Pt plans to go to [facility] immediately.

## 2022-05-22 NOTE — Unmapped (Signed)
Addended by: Burns Spain on: 05/22/2022 10:34 AM     Modules accepted: Orders

## 2022-05-22 NOTE — Unmapped (Addendum)
Received page for critical lab value. Serum potassium is 6.3. Spoke with Dr. Christy Sartorius, on-call transplant nephrologist, who requested I place orders for lokelma 10g two doses today and then daily for 3 days. Per Dr. Christy Sartorius, today's doses should be 6 hours apart. Dr. Arvin Collard, pt primary nephrologist, was added to the message as well and requested pt continue to take lokelma every other day once he's completed the initial 5 doses.     Spoke with pt and his wife- they verbalized understanding of this plan and had no further questions.

## 2022-05-23 DIAGNOSIS — Z940 Kidney transplant status: Principal | ICD-10-CM

## 2022-05-23 DIAGNOSIS — E875 Hyperkalemia: Principal | ICD-10-CM

## 2022-05-23 MED ORDER — LOKELMA 10 GRAM ORAL POWDER PACKET
PACK | 0 refills | 0 days | Status: CP
Start: 2022-05-23 — End: ?
  Filled 2022-06-06: qty 11, 22d supply, fill #0

## 2022-05-23 NOTE — Unmapped (Signed)
Pt here for elevated potassium , labs drawn this morning.  Potassium 6.3 per lab results.  Pt denies any pain or concerns.  Kidney transplant pt.

## 2022-05-23 NOTE — Unmapped (Signed)
[facility]  Emergency Department Provider Note     ED Clinical Impression     Final diagnoses:   Routine lab draw (Primary)      Impression, Medical Decision Making, ED Course     Impression: 76 y.o. male with a past medical history of type 2 diabetes mellitus, hyperlipidemia, hypertension, LURD kidney transplant in 2013 (complicated by a massive PE in 2014, osteomyelitis requiring lateral foot amputation in 2016, CVA in 2017), A-fib (on Warfarin), and stage 3 CKD who presents with asymptomatic hyperkalemia as described below.     DDx/MDM: Patient presented for hyperkalemia, will redraw BMP and reevaluate.  If hyperkalemia is key firmed plan to discuss with nephrology versus patient's outpatient team.  Will obtain EKG.    Diagnostic workup as below.     Orders Placed This Encounter   Procedures    CBC w/ Differential    Comprehensive Metabolic Panel    PT-INR    Magnesium    Cardiac Monitoring    ECG 12 Lead    ECG 12 Lead       ED Course as of 05/22/22 2337   Tue May 22, 2022   1620 Admit interpretation of EKG.  Sinus rhythm first-degree AV block, rate 77, PR 218, QRS 94, QTc 427.  No peaked T waves.  No ST elevation.   1648 CBC w/ Differential:    WBC 7.5   RBC 5.26   HGB 14.8   HCT 44.5   MCV 84.7   MCH 28.1   MCHC 33.1   RDW 14.2   MPV 7.6   Platelet 206   nRBC 0   Neutrophils % 73.5   Lymphocytes % 16.8   Monocytes % 7.6   Eosinophils % 1.5   Basophils % 0.6   Absolute Neutrophils 5.5   Absolute Lymphocytes 1.3   Absolute Monocytes  0.6   Absolute Eosinophils 0.1   Absolute Basophils  0.0  nonactionable   1709 Magnesium: 2.0   1709 Comprehensive Metabolic Panel(!):    Sodium 140   Potassium 4.4   Chloride 105   CO2 28.8   Anion Gap 6   Bun 22   Creatinine 1.24(!)   BUN/Creatinine Ratio 18   eGFR CKD-EPI (2021) Male 61   Glucose 242(!)   Calcium 9.3   Albumin 3.6   Total Protein 6.9   Total Bilirubin 0.4   SGOT (AST) 23   ALT 16   Alkaline Phosphatase 91  Potassium 4.4, creatinine 1.24.  Potassium within normal limits.  Creatinine at baseline.   1719 Patient stable for discharge.  Discussed discharge instructions and return cautions with the patient expressed understanding.       Independent Interpretation of Studies: I have independently interpreted the following studies:  See ED course.    ____________________________________________    The case was discussed with the attending physician, who is in agreement with the above assessment and plan.      History     Chief Complaint  Chief Complaint   Patient presents with    Abnormal Lab       HPI   EDMOND GONNERING is a 76 y.o. male with past medical history as below who presents with hyperkalemia. Per chart review, patient received a call from his transplant team earlier today informing him that his labs from this morning showed hyperkalemia to 6.3. He was initially recommended to start PO Lokelma at home. However, he was unable to receive this medication 2/2 stock  issues at multiple local pharmacies as well as his insurance requiring a prior authorization for the medication. He was subsequently recommended to present to the ED for treatment. He states that he has not experienced similar problems with potassium levels previously. He currently denies chest pain, shortness of breath, abdominal pain, nausea, vomiting, diarrhea, urinary symptoms, rhinorrhea, coughs, or fevers.    Outside Historian(s): I have obtained additional history/collateral from none.    External Records Reviewed: I have reviewed Nephrology Notes 05/22/22 & 05/08/22 for history.    Past Medical History:   Diagnosis Date    Arthritis     Atrial flutter (CMS-HCC)     Cataract     Diabetes mellitus (CMS-HCC)     Diabetic nephropathy (CMS-HCC)     Diabetic retinopathy (CMS-HCC)     Fractures     Ganglion cyst     Hand injury     Heart disease     Hyperlipidemia     Hypertension     Impaired mobility     Joint pain     Lack of access to transportation     Osteomyelitis (CMS-HCC) 11/2014    Pulmonary embolism (CMS-HCC) 05/2014    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    PR AMPUTATION METATARSAL+TOE,SINGLE Left 07/06/2014    Procedure: AMPUTATION, METATARSAL, WITH TOE SINGLE;  Surgeon: Marion Downer, MD;  Location: MAIN OR [facility];  Service: Vascular    PR AMPUTATION METATARSAL+TOE,SINGLE Left 10/28/2014    Procedure: AMPUTATION, METATARSAL, WITH TOE SINGLE;  Surgeon: Maple Mirza, MD;  Location: MAIN OR [place];  Service: Vascular    PR COLONOSCOPY FLX DX W/COLLJ SPEC WHEN PFRMD N/A 02/27/2022    Procedure: COLONOSCOPY, FLEXIBLE, PROXIMAL TO SPLENIC FLEXURE; DIAGNOSTIC, W/WO COLLECTION SPECIMEN BY BRUSH OR WASH;  Surgeon: Luanne Bras, MD;  [facility]ED[facility];  Service: Gastroenterology    PR COLONOSCOPY W/BIOPSY SINGLE/MULTIPLE  02/27/2022    Procedure: COLONOSCOPY, FLEXIBLE, PROXIMAL TO SPLENIC FLEXURE; WITH BIOPSY, SINGLE OR MULTIPLE;  Surgeon: Luanne Bras, MD;  Location: [facility]ED[facility];  Service: Gastroenterology    PR COLSC FLX W/RMVL OF TUMOR POLYP LESION SNARE TQ Left 02/27/2022    Procedure: COLONOSCOPY FLEX; W/REMOV TUMOR/LES BY SNARE;  Surgeon: Luanne Bras, MD;  Location: [facility];  Service: Gastroenterology    PR VITRECTOMY,PANRETINAL LASER RX Right 11/26/2016    Procedure: VITRECTOMY, MECHANICAL, PARS PLANA APPROACH; WITH ENDOLASER PANRETINAL PHOTOCOAGULATION;  Surgeon: Melvyn Novas, MD;  Location: [facility] OR [facility];  Service: Ophthalmology    PR Hessie Knows LASER RX Left 01/28/2017    Procedure: VITRECTOMY, MECHANICAL, PARS PLANA APPROACH; WITH ENDOLASER PANRETINAL PHOTOCOAGULATION;  Surgeon: Melvyn Novas, MD; Location: [facility] OR [facility];  Service: Ophthalmology    SKIN BIOPSY      VASECTOMY         No current facility-administered medications for this encounter.    Current Outpatient Medications:     acetaminophen (TYLENOL) 325 MG tablet, Take by mouth., Disp: , Rfl:     aspirin (ECOTRIN) 81 MG tablet, Take 1  tablet (81 mg total) by mouth daily as needed (when flying). (Patient not taking: Reported on 04/16/2022), Disp: , Rfl:     BD ULTRA-FINE NANO PEN NEEDLE 32 gauge x 5/32 (4 mm) Ndle, USE 3 TIMES DAILY BEFORE MEALS, Disp: 300 each, Rfl: 3    blood-glucose meter kit, Use as instructed., Disp: 1 each, Rfl: 0    cholecalciferol, vitamin D3-50 mcg, 2,000 unit,, 50 mcg (2,000 unit) tablet, Take 1 tablet (50 mcg total) by mouth daily., Disp: , Rfl:     empagliflozin (JARDIANCE) 10 mg tablet, Take 1 tablet (10 mg total) by mouth every morning., Disp: 90 tablet, Rfl: 3    flash glucose sensor (FREESTYLE LIBRE 14 DAY SENSOR) kit, by Other route every fourteen (14) days., Disp: 2 each, Rfl: 11    furosemide (LASIX) 40 MG tablet, Take 1 tablet (40 mg total) by mouth daily. 40 mg daily. And 1 additional tablet for edema., Disp: , Rfl:     gabapentin (NEURONTIN) 300 MG capsule, TAKE 1 CAPSULE BY MOUTH TWICE DAILY AS DIRECTED (Patient taking differently: Take 1 capsule (300 mg total) by mouth two (2) times a day.), Disp: 180 capsule, Rfl: 3    HUMALOG KWIKPEN INSULIN 100 unit/mL injection pen, INJECT 15-25 UNITS WITH MEALS + CORRECTION **MAX 100 UNITS A DAY**, Disp: 60 mL, Rfl: 3    hydrocortisone 2.5 % cream, , Disp: , Rfl:     insulin degludec 200 unit/mL (3 mL) InPn, Inject 0.4 mL (80 Units total) under the skin daily. Titrate up to 100units daily as instructed., Disp: 45 mL, Rfl: 3    ketoconazole (NIZORAL) 2 % cream, , Disp: , Rfl:     LOKELMA 10 gram PwPk packet, One dose daily for 4 days starting tomorrow, and every other day starting 12/24., Disp: 30 packet, Rfl: 0    losartan (COZAAR) 25 MG tablet, Take 1 tablet (25 mg total) by mouth daily., Disp: 90 tablet, Rfl: 3    magnesium chloride (SLOW_MAG) 64 mg TbEC, Take 128 mg by mouth three (3) times a day (at 6am, noon and 6pm). (2 tablets) 9am 1 pm and 9 pm, Disp: , Rfl:     metoPROLOL succinate (TOPROL-XL) 100 MG 24 hr tablet, TAKE ONE TABLET TWICE DAILY, Disp: 180 tablet, Rfl: 1    miscellaneous medical supply Misc, 1 application by Miscellaneous route daily as needed. Lightweight wheelchair., Disp: 1 each, Rfl: 0    miscellaneous medical supply Misc, 1 application by Miscellaneous route nightly. Recommend CPAP 14 cm H2O with EPR 3 and heated humidifier for nasal dryness, mask: ResMed Activa Lt nasal mask (Large)., Disp: 1 each, Rfl: 0    multivitamin capsule, Take 1 capsule by mouth daily., Disp: , Rfl:     MYFORTIC 180 mg EC tablet, Take 3 tablets (540 mg total) by mouth two (2) times a day., Disp: 540 tablet, Rfl: 3    pravastatin (PRAVACHOL) 20 MG tablet, TAKE ONE TABLET BY MOUTH EVERY DAY, Disp: 90 tablet, Rfl: 3    tacrolimus (PROGRAF) 1 MG capsule, Take 3 capsules (3 mg total) by mouth in the morning AND 2 capsules (2 mg total) in the evening., Disp: 450 capsule, Rfl: 3    TRULICITY 1.5 mg/0.5 mL PnIj, INJECT 1.5MG  (0.5ML) SUBCUTANEOUSLY ONCEA WEEK, Disp: 2 mL, Rfl: 11    warfarin (JANTOVEN) 5 MG tablet, TAKE 1 TABLET BY MOUTH DAILY WITH EVENING MEAL. TAKE 6 DAYS A WEEK (TUESDAY THROUGH SUNDAY) AS DIRECTED., Disp: 90 tablet, Rfl: 0  Facility-Administered Medications Ordered in Other Encounters:     bevacizumab, , , PRN (once a day), Melvyn Novas, MD, 2.5 mg at 01/28/17 0750    Allergies  Egg derived; Enalapril; Epinephrine; Grass pollen-bermuda, standard; Levofloxacin; Lisinopril; Mepivacaine hcl; and Penicillins    Family History  Family History   Problem Relation Age of Onset    Kidney disease Mother     Diabetes Mother     Hypertension Mother     Kidney disease Father     Cancer Father     Ulcers Father     Kidney disease Maternal Grandmother     Diabetes Maternal Grandmother     Glaucoma Neg Hx     Macular degeneration Neg Hx     Strabismus Neg Hx        Social History  Social History     Tobacco Use    Smoking status: Never     Passive exposure: Never    Smokeless tobacco: Never   Vaping Use    Vaping Use: Never used   Substance Use Topics    Alcohol use: No    Drug use: No        Physical Exam     VITAL SIGNS:      Vitals:    05/22/22 1603 05/22/22 1634   BP: 153/76 165/76   Pulse: 79 80   Resp: 22 (S) 20   Temp: 37.1 ??C (98.8 ??F)    TempSrc: Oral    SpO2: 99% 97%   Weight: (!) 109.7 kg (241 lb 14.4 oz)        Constitutional: Alert and oriented. No acute distress.  Eyes: Conjunctivae are normal.  HEENT: Normocephalic and atraumatic. Conjunctivae clear. No congestion. Moist mucous membranes.   Cardiovascular: Rate as above, regular rhythm. Normal and symmetric distal pulses. Brisk capillary refill. Normal skin turgor.  Respiratory: Normal respiratory effort. Breath sounds are normal. There are no wheezing or crackles heard.  Gastrointestinal: Soft, non-distended, non-tender.  Genitourinary: Deferred.  Musculoskeletal: Non-tender with normal range of motion in all extremities.  Neurologic: Normal speech and language. No gross focal neurologic deficits are appreciated. Patient is moving all extremities equally, face is symmetric at rest and with speech.  Skin: Skin is warm, dry and intact. No rash noted.  Psychiatric: Mood and affect are normal. Speech and behavior are normal.     Radiology     No orders to display       Pertinent labs & imaging results that were available during my care of the patient were independently interpreted by me and considered in my medical decision making (see chart for details).    Portions of this record have been created using Scientist, clinical (histocompatibility and immunogenetics). Dictation errors have been sought, but may not have been identified and corrected.    Documentation assistance was provided by Cherly Hensen, Scribe, on May 22, 2022 at 4:16 PM for Cheryle Horsfall, MD.    Documentation assistance provided by the above mentioned scribe. I was present during the time the encounter was recorded. The information recorded by the scribe was done at my direction and has been reviewed and validated by me.        Cheryle Horsfall, MD  Resident  05/22/22 [phone]

## 2022-05-24 ENCOUNTER — Ambulatory Visit: Admit: 2022-05-24 | Discharge: 2022-05-25 | Payer: MEDICARE

## 2022-05-24 DIAGNOSIS — Z79899 Other long term (current) drug therapy: Principal | ICD-10-CM

## 2022-05-24 DIAGNOSIS — Z940 Kidney transplant status: Principal | ICD-10-CM

## 2022-05-24 LAB — POTASSIUM: POTASSIUM: 4.5 mmol/L (ref 3.4–4.8)

## 2022-05-29 NOTE — Unmapped (Signed)
Patient's wife called and left message stating  we sent Dr. Tiburcio Pea a message on the 14th ZO:XWRU cancelling one of his Rx and the other needing something special done. She states please read that and tell us what to do, it needs to be done in the new year or it won't be covered.    Chart review shows patient's message from 05/17/22 [name],  It has recently come to our attention that [employer] is changing the coverage for2 of my medications.We would like your guidance and advice going forward. As per the letter we received, they have notified us as of January 1,2024,TRESIBA will no longer be covered. ???you can switch to a covered drug or you doctor can request an exception.??? Currently we have a 3 month supply.Secondly, Trulicity now ???requires prior review to confirm appropriate use under Medicare Part D law.??? How does that impact our current situation?We trust you and yours will have a safe and merry Christmas.    Message routed to Dr. Tiburcio Pea to review alternatives for insulin degludec per protocol.     Mychart message sent to patient.

## 2022-05-31 ENCOUNTER — Ambulatory Visit: Admit: 2022-05-31 | Discharge: 2022-06-01 | Payer: MEDICARE

## 2022-05-31 NOTE — Unmapped (Signed)
ORTHOPAEDIC NOTE     Boston Catarino L. 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 fracture sustained on 07/27/2021     PLAN:     I offered appointment for large-volume ultrasound-guided injection for his adhesive capsulitis but currently he is happy with his motion and declined  He will continue working on range of motion exercises of his shoulder but understands that he will have some terminal loss of range of motion of his shoulder  He will contact me if he wants to proceed with an ultrasound-guided injection  -Advised OTC analgesic PRN pain  -Discussed treatment options and patient was amenable to the above plan and was instructed to call and be seen if there is any increasing pain or concerns.     Follow up: As needed       Chief Complaint:     Recheck right shoulder     SUBJECTIVE:     HPI: David Ellis is a  76 y.o. with a PMHx as below presenting to Clinic for reevaluation of right proximal humerus fracture sustained on 07/27/2021.  He has been working with a pulley system and states he is overall happy with his shoulder motion is he is able to comb his hair.  He has moved a shelf down 2 inches so he can reach it.  He never proceeded with a large-volume injection that I offered previously.  Currently is more concerned about his balance.  He does occasionally taking Tylenol for pain.       Allergies  Allergies   Allergen Reactions    Egg Derived Rash    Enalapril Cough    Epinephrine Palpitations    Grass Pollen-B[place]uda, Standard Itching    Levofloxacin Other (See Comments)     Per patient    Lisinopril Rash    Mepivacaine Hcl Palpitations    Penicillins Other (See Comments)     As child  Tolerated amp/sulbactam without side effects during 10/2014 hospitalization     Past Medical History  Past Medical History:   Diagnosis Date    Arthritis     Atrial flutter (CMS-HCC)     Cataract     Diabetes mellitus (CMS-HCC)     Diabetic nephropathy (CMS-HCC)     Diabetic retinopathy (CMS-HCC)     Fractures     Ganglion cyst     Hand injury     Heart disease     Hyperlipidemia     Hypertension     Impaired mobility     Joint pain     Lack of access to transportation     Osteomyelitis (CMS-HCC) 11/2014    Pulmonary embolism (CMS-HCC) 05/2014    Retinopathy due to secondary diabetes (CMS-HCC) 01/2013    Squamous cell skin cancer 11/2013    Stroke (CMS-HCC)     Tear of meniscus of knee     Transplanted kidney 08/21/2011        PHYSICAL EXAM:     MSK: Right shoulder  Inspection: No edema, no erythema, skin intact  Palpation: Mild tenderness when I palpate on the proximal humerus  ROM: Abduction 70, forward flexion 80  Strength: Excellent strength in all planes range of motion of shoulder  normal sensation RUE  radial pulses easily palpable  Imaging   no new films obtained today    MEDICAL DECISION MAKING (level of service defined by 2/3 elements)     Number/Complexity of Problems Addressed 1 acute, uncomplicated illness or injury (99203/99213)   Amount/Complexity of Data to be Reviewed/Analyzed --MINIMAL (99202/99212--   Risk of Complications/Morbidity/Mortality of Management --LOW Risk of Morbidity from Additional Diagnostic Testing or Treatment (99203/99213)--   DME ORDER:  Dx:  ,                   cc:  Min, Dorann Ou, MD  *Patient note was created using Dragon Dictation sotware. Errors in syntax or grammar may not have been identified and edited on initial review.

## 2022-06-01 NOTE — Unmapped (Signed)
[facility] Refill Coordination Note    Specialty Medication(s) to be Shipped:   Transplant: Myfortic 180mg  and tacrolimus 1mg     Other medication(s) to be shipped:  David Ellis, DOB: September 29, 1945  Phone: [phone] (home)       All above HIPAA information was verified with patient.     Was a Nurse, learning disability used for this call? No    Completed refill call assessment today to schedule patient's medication shipment from the [facility] [phone]).  All relevant notes have been reviewed.     Specialty medication(s) and dose(s) confirmed: Regimen is correct and unchanged.   Changes to medications: David Ellis reports no changes at this time.  Changes to insurance: No  New side effects reported not previously addressed with a pharmacist or physician: None reported  Questions for the pharmacist: No    Confirmed patient received a Conservation officer, historic buildings and a Surveyor, mining with first shipment. The patient will receive a drug information handout for each medication shipped and additional FDA Medication Guides as required.       DISEASE/MEDICATION-SPECIFIC INFORMATION        N/A    SPECIALTY MEDICATION ADHERENCE     Medication Adherence    Patient reported X missed doses in the last month: 0  Specialty Medication: MYFORTIC 180 MG EC tablet (mycophenolate)  Patient is on additional specialty medications: Yes  Additional Specialty Medications: tacrolimus 1 MG capsule (PROGRAF)  Patient Reported Additional Medication X Missed Doses in the Last Month: 0  Patient is on more than two specialty medications: No      Adherence tools used: patient uses a pill box to manage medications      Support network for adherence: family member                    Were doses missed due to medication being on hold? No    MYFORTIC 180  mg: 7 days of medicine on hand   tacrolimus 1 mg: 7 days of medicine on hand       REFERRAL TO PHARMACIST     Referral to the pharmacist: Not needed      [facility] Shipping address confirmed in Epic.     Delivery Scheduled: Yes, Expected medication delivery date: 06/06/22.     Medication will be delivered via Same Day Courier to the prescription address in Epic WAM.    David Ellis   [facility] Specialty Technician

## 2022-06-06 MED FILL — MYFORTIC 180 MG TABLET,DELAYED RELEASE: ORAL | 30 days supply | Qty: 180 | Fill #7

## 2022-06-06 MED FILL — TACROLIMUS 1 MG CAPSULE, IMMEDIATE-RELEASE: ORAL | 30 days supply | Qty: 150 | Fill #2

## 2022-06-06 NOTE — Unmapped (Signed)
Patient's wife stating patient needs PA for Trulicity and that insurance no longer covering [place].    Last appointment: 03/28/22  Upcoming appointment: 09/27/22    Per chart review, patient prescribed degludec u200; unable to substitute per clinic protocol.    PA for Trulicity 1.5mg  submitted via [email or web address], awaiting determination.     Key: [id number]    Message routed to Dr. Tiburcio Pea. Mychart message sent to patient.

## 2022-06-12 DIAGNOSIS — G629 Polyneuropathy, unspecified: Principal | ICD-10-CM

## 2022-06-12 MED ORDER — GABAPENTIN 300 MG CAPSULE
ORAL_CAPSULE | 3 refills | 0 days
Start: 2022-06-12 — End: ?

## 2022-06-12 MED ORDER — LOSARTAN 25 MG TABLET
ORAL_TABLET | Freq: Every day | ORAL | 3 refills | 0 days
Start: 2022-06-12 — End: ?

## 2022-06-14 MED ORDER — GABAPENTIN 300 MG CAPSULE
ORAL_CAPSULE | 3 refills | 0 days | Status: CP
Start: 2022-06-14 — End: ?

## 2022-06-14 MED ORDER — LOSARTAN 25 MG TABLET
ORAL_TABLET | Freq: Every day | ORAL | 3 refills | 90 days | Status: CP
Start: 2022-06-14 — End: ?

## 2022-06-27 DIAGNOSIS — E1129 Type 2 diabetes mellitus with other diabetic kidney complication: Principal | ICD-10-CM

## 2022-06-27 MED ORDER — TRULICITY 1.5 MG/0.5 ML SUBCUTANEOUS PEN INJECTOR
SUBCUTANEOUS | 11 refills | 28 days | Status: CP
Start: 2022-06-27 — End: ?

## 2022-06-27 MED ORDER — INSULIN GLARGINE (U-300) CONC. 300 UNIT/ML (1.5 ML) SUBCUTANEOUS PEN
Freq: Every day | SUBCUTANEOUS | 3 refills | 102 days | Status: CP
Start: 2022-06-27 — End: ?

## 2022-06-27 MED ORDER — HUMALOG KWIKPEN (U-100) INSULIN 100 UNIT/ML SUBCUTANEOUS
3 refills | 0 days | Status: CP
Start: 2022-06-27 — End: ?

## 2022-06-27 NOTE — Unmapped (Signed)
Patient requesting Libre sensor prescription be sent to [facility].    Prescription for Libre 14 day sensors submitted to [employer], forwarded to provider for signature.

## 2022-06-27 NOTE — Unmapped (Signed)
David Ellis reached out to me (his kidney transplant RN coordinator) a couple of times this week, and this morning left a detailed vm. His inquiries related to a need for new BG sensors and he also had questions about whether or not his BG info was still being uploaded to Epic for his Endocrine team to see.    Epic chat message sent to Dr Leretha Pol for follow up.

## 2022-06-27 NOTE — Unmapped (Signed)
Patient requesting David Ellis 14 day sensor refill to [facility]. Per chart review, I completed this 06/27/22.

## 2022-06-27 NOTE — Unmapped (Signed)
[facility] Refill Coordination Note    Specialty Medication(s) to be Shipped:   Transplant: Myfortic 180mg  and tacrolimus 1mg     Other medication(s) to be shipped: No additional medications requested for fill at this time     David Ellis, DOB: 02-02-1946  Phone: [phone] (home)       All above HIPAA information was verified with patient.     Was a Nurse, learning disability used for this call? No    Completed refill call assessment today to schedule patient's medication shipment from the [facility] [phone]).  All relevant notes have been reviewed.     Specialty medication(s) and dose(s) confirmed: Regimen is correct and unchanged.   Changes to medications: [name] reports no changes at this time.  Changes to insurance: No  New side effects reported not previously addressed with a pharmacist or physician: None reported  Questions for the pharmacist: No    Confirmed patient received a Conservation officer, historic buildings and a Surveyor, mining with first shipment. The patient will receive a drug information handout for each medication shipped and additional FDA Medication Guides as required.       DISEASE/MEDICATION-SPECIFIC INFORMATION        N/A    SPECIALTY MEDICATION ADHERENCE     Medication Adherence    Patient reported X missed doses in the last month: 0  Specialty Medication: Myfortic 180mg   Patient is on additional specialty medications: Yes  Additional Specialty Medications: Tacrolimus 1mg   Patient Reported Additional Medication X Missed Doses in the Last Month: 0  Patient is on more than two specialty medications: No      Adherence tools used: patient uses a pill box to manage medications      Support network for adherence: family member                    Were doses missed due to medication being on hold? No    Myfortic 180 mg: 7 days of medicine on hand   Tacrolimus 1 mg: 7 days of medicine on hand     REFERRAL TO PHARMACIST     Referral to the pharmacist: Not needed      [facility] Shipping address confirmed in Epic.     Delivery Scheduled: Yes, Expected medication delivery date: 07/04/22.     Medication will be delivered via Next Day Courier to the prescription address in Epic WAM.    [name], [facility]   [facility] Specialty Pharmacist

## 2022-06-27 NOTE — Unmapped (Signed)
Returned pt call. No answer so left detailed vm for him to call back or reach out via MyChart with request/concerns.

## 2022-06-27 NOTE — Unmapped (Signed)
Reviewed and discussed w RN Nocero then patient via phone.    Trulicity does not need PA b/c not a new start. Continue.  Appears that glargine is preferred over [place].  I have sent in substitute.   Confirmed I signed parachute request to continue Freestyle LIbre.   I am also sending updated script for new year for Humalog.      1. 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)  - insulin glargine U-300 conc (TOUJEO SOLOSTAR) 300 unit/mL (1.5 mL) injection pen; Inject 70 Units under the skin daily.  Dispense: 24 mL; Refill: 3  - HUMALOG KWIKPEN INSULIN 100 unit/mL injection pen; INJECT 15-25 UNITS WITH MEALS + CORRECTION **MAX 100 UNITS A DAY**  Dispense: 60 mL; Refill: 3  - dulaglutide (TRULICITY) 1.5 mg/0.5 mL PnIj; Inject 0.5 mL (1.5 mg total) under the skin every seven (7) days.  Dispense: 2 mL; Refill: 11

## 2022-07-03 ENCOUNTER — Ambulatory Visit: Admit: 2022-07-03 | Discharge: 2022-07-04 | Payer: MEDICARE

## 2022-07-03 DIAGNOSIS — M7501 Adhesive capsulitis of right shoulder: Principal | ICD-10-CM

## 2022-07-03 MED ADMIN — triamcinolone acetonide (KENALOG) injection 20 mg: 20 mg | INTRA_ARTICULAR | @ 13:00:00 | Stop: 2022-07-03

## 2022-07-03 MED FILL — TACROLIMUS 1 MG CAPSULE, IMMEDIATE-RELEASE: ORAL | 30 days supply | Qty: 150 | Fill #3

## 2022-07-03 MED FILL — MYFORTIC 180 MG TABLET,DELAYED RELEASE: ORAL | 30 days supply | Qty: 180 | Fill #8

## 2022-07-03 NOTE — Unmapped (Signed)
SPORTS MEDICINE RETURN VISIT    ASSESSMENT AND PLAN      Diagnosis ICD-10-CM Associated Orders   1. Adhesive capsulitis of right shoulder  M75.01 AMB REFERRAL TO PHYSICAL THERAPY     Lg Joint Inj: R glenohumeral             Assessment & Plan  Right proximal humeral fracture complicated by posttraumatic adhesive capsulitis now status post ultrasound-guided large-volume injection.  Tolerated well.  Aftercare return precautions given.  We did discuss that he may not experience further improvement in his range of motion after this injection depending on how much of his limited range of motion is secondary to his now healed fracture versus how much is due to frozen shoulder.  Referred back to physical therapy for continued range of motion.  Aftercare return precautions given.  He is comfortable with this plan     Post-injection ROM:  Abd: 90, AROM and PROM  Flex: 90, AROM and PROM  ER: 20      Return if symptoms worsen or fail to improve.    Procedure(s):  Large Volume Glenohumeral Joint Injection with manipulation    Consent   After discussing the various treatment options for the condition, It was agreed that a large volume injection with manipulation would be the next appropriate step in treatment. The nature of and the indications for this  injection were reviewed in detail with the patient today. The inherent risks of injection including infection, allergic reaction, increased pain, incomplete relief or temporary relief of symptoms, alterations of blood glucose levels requiring careful monitoring and treatment as indicated, tendon, ligament or articular cartilage rupture or degeneration, nerve injury, skin depigmentation, and/or fatty atrophy were discussed.   Procedure   After the risks and benefits of the procedure were explained,verbal consent was given, and a procedural time-out was performed. The glenohumeral joint and surrounding structures were visualized with ultrasound and the glenohumeral joint space was identified  The site for the injection was properly marked and prepped with Chlorhexadine solution.  The injection site was anesthetized with ethyl chloride and 3cc of 1% Lidocaine with a 25 gauge 1.5 inch needle. Using ultrasound guidance, the glenohumeral joint was visualized and injected with a total of 25 cc of fluid including sterile water ropivacaine 20 milligrams of Kenalog, 4using a sterile technique and a 22 gauge 2.5 inch needle.  Capsular distention was visualized during the entire procedure.  During injection, there was unrestricted flow and care was taken not to inject corticosteroid into the skin or subcutaneous tissues. There were no complications during the procedure.      Post procedure a sterile band-aide was applied.  Then the patient was placed in a supine position we then did a manipulation in clinic in the forward flexion abduction internal and external rotation directions. The patient tolerated the manipulation well with only mild to moderate discomfort.  Post-injection instructions were given regarding post-procedure care, when to follow up in clinic and what to expect from the procedure. The patient tolerated the injection well and was discharged without complication.    NEED FOR SONOGRAPHIC GUIDANCE    Given the complexity of this problem, the anatomic location of this structure, sonographic guidance is recommended to prevent injury to neurovascular structures and confirm accuracy of injection. The accuracy of doing these injections blind is poor and the benefit to the patient by using ultrasound guidance is significant to avoid complications.     Reference:  American Medical Society for Sports Medicine (AMSSM)  position statement: interventional musculoskeletal ultrasound in sports medicine.  Morey Hummingbird MM, Adams E, Berkoff D, Concoff AL, Jiles Crocker J Sports Med. 2014 Oct 20. pii: [id number]. doi: [id number]        SUBJECTIVE     Chief Complaint:   Chief Complaint   Patient presents with    Right Shoulder - Pain, Injection(s)       Interval HPI 07/03/2022: 77 y.o. male presents for follow-up on his right shoulder.  He has continued with physical therapy at [facility] and has noted some small improvements in his range of motion.  After discussion with Barbara Cower he would like to proceed with large-volume injection today.  Concerned that it will hurt.    History of Present Illness: 77 y.o. male who presents for consideration of LVI for adhesive capsulitis s/p right proximal humerus fracture on 07/27/21.  Has continued to have pain when sitting at his computer for long periods of time.  Reports 97% improvement in symptoms, is pleased with current progression and questions whether he needs an injection at this time.      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 as calculated from the following:    Height as of 05/08/22: 185.4 cm (6' 1).    Weight as of 05/22/22: 109.7 kg (241 lb 14.4 oz).  Gen: Well-appearing male in no acute distress  MSK:   RIGHT SHOULDER  Inspection/palpation: No swelling, erythema, deformity, atrophy or hypertrophy noted  Tenderness: laterally, underneath the acromion  Range of Motion: Fwd Elevation: 70; ABD: 70; IR: lateral glute; ER: 0; ABD/IR: deferred; ABD/ER: deferred  Strength: normal  Provocative Tests:   deferred       Imaging/other tests: no new imaging       Orthopaedic PROMIS:          02/15/2022   [facility]   Upper Extremity Physical Function CAT Score 10       PRO Status:  Incomplete.       ADMINISTRATIVE     I have personally reviewed and interpreted the images (as available).  Point-of-care ultrasound imaging is on file and stored in a permanent location (if performed).  I have personally reviewed prior records and  incorporated relevant information above (as available).    Note written with [employer] Scribe service.        MODIFIER 25 (Significant, Separately Billable Evaluation and Management)     Documentation to ensure appropriate insurance payment for medically necessary work    Per the [employer] (rev. 06/04/2021) Chapter 13, Section B. Evaluation & Management (E&M) Services, paragraph 5:   ???In general, E&M services on the same date of service as the minor surgical procedure are included in the payment for the procedure???However, a significant and separately identifiable E&M service unrelated to the decision to perform the minor surgical procedure is separately reportable with modifier 25. The E&M service and minor surgical procedure do not require different diagnoses.???    Per the American Medical Association in Reporting CPT Modifier 25 [CPT??Geophysicist/field seismologist (Online). 2023;33(11):1-12.] Page 1, Appropriate Use, paragraph 1:   ???Modifier 25 is used to indicate that a patient's condition required a significant, separately identifiable evaluation and management (E/M) service above and beyond that associated with another procedure or service being reported by the same physician or other qualified health care professional [facility]) on the same date. This service must be above and beyond the other service provided or beyond the usual preoperative and postoperative care associated with the procedure or service that was performed on that same date, and it must be substantiated by documentation in the patient's record that satisfies the relevant criteria for the respective E/M service to be reported.???    Per the American Medical Association in Reporting CPT Modifier 25 [CPT??Geophysicist/field seismologist (Online). 2023;33(11):1-12.] Page 2, Considerations, bullet point 2 (Requires awareness of usual preoperative and postoperative services):   ???Pre- and post-operative services typically associated with a procedure include the following and cannot be reported with a separate E/M services code:   Review of patient's relevant past medical history,  Assessment of the problem area to be treated by surgical or other service,  Formulation and explanation of the clinical diagnosis,  Review and explanation of the procedure to the patient, family, or caregiver,  Discussion of alternative treatments or diagnostic options,  Obtaining informed consent,  Providing postoperative care instructions,  Discussion of any further treatment and follow up after the procedure???    As the service provider for this encounter, I attest that the patient's condition required a significant, separately identifiable, medical necessary evaluation and management service in addition to the procedure performed on the same date of service. The evaluation and management service was above and beyond the usual preoperative and postoperative care associated with the procedure. The specific elements of the encounter that represent evaluation and management service above and beyond the usual preoperative and postoperative care associated with the procedure include, but are not limited to:  Reviewed the patient's relevant past surgical history, social history and family history  Reviewed the patient's medications  Reviewed the patient's allergies  Independently obtained history of present illness and relevant review of systems  Independently reviewed laboratory, imaging and/or other data  Independently synthesized history, physical examination, laboratory, imaging and/or other data to formulate a management plan including elements separate from the procedure itself and usual postprocedural care       PROCEDURES     Lg Joint Inj: R glenohumeral on 07/03/2022 8:20 AM  Indications: pain  Details: ultrasound-guided  Laterality: right  Location: shoulder  Medications: 20 mg triamcinolone acetonide 10 mg/mL    Medical Care Team Attestation: All ProcDoc orders were read back and verbally confirmed  with the procedure provider, including but not limited to patient name, medication name, dose, and route, before any actions were taken.  Provider Attestation: The information documented by members of my medical care team was reviewed and verified for accuracy by me.           DME     DME ORDER:  Dx:  ,

## 2022-07-03 NOTE — Unmapped (Signed)
Plan:Large Volume injection for Adhesive Capsulitis    You had a large volume injection today. This procedure was done for your frozen shoulder. Your shoulder was first numbed with lidocaine followed by an injection that included a large amount of sterile saline and a very small amount of cortisone, or steroid. The steroid we use is called Kenalog. The volume in the injection is meant to stretch out your shoulder capsule which is stuck down onto the joint structures limiting your motion and causing pain.     You received a corticosteroid injection to reduce pain and inflammation.  Please note that it can take up to 2 weeks for this injection to fully work.  While many people will feel relief sooner, please be patient.      The injection contained a corticosteroid and a numbing agent.  The numbing agent can last for 1-6 hours.  After this wears off you may have increased pain until the steroid has a chance to work.    What are some of the possible side effects of a steroid injection?    Common side effects:  temporarily elevated blood sugar (in diabetic patients) that can last a few days   flushing of the skin, especially the face  temporary rise in blood pressure  discoloration or atrophy of the skin at the injection site    Call your doctor at once if you have:  persistent worsening pain or swelling, fever;  blurred vision, tunnel vision, eye pain, or seeing halos around lights;  fast or slow heartbeats;  increased blood pressure that is associated with severe headache, blurred vision, pounding in your neck or ears, anxiety, nosebleed;  headaches, ringing in your ears, dizziness, nausea, vision problems, pain behind your eyes    This is not a complete list of side effects and others may occur. Call your doctor for medical advice about side effects.     What other drugs may be affected after the injection?  Many drugs can interact with steroids. Not all possible interactions are listed here. Tell your doctor about all your current medicines and any you start or stop using, especially:  an antibiotic or antifungal medication;  birth control pills or hormone replacement therapy;  a blood thinner (warfarin, Coumadin, and others);  a diuretic or water pill;  insulin or oral diabetes medicine;  medicine to treat tuberculosis;  a nonsteroidal anti-inflammatory drug or NSAID (aspirin, ibuprofen, naproxen, diclofenac, indomethacin, Advil, Aleve, Celebrex, and many others); or  seizure medication.          It is very important that you go to Physical therapy or do the exercises right away and regularly so that your shoulder does not lose the range of motion we were able to gain in clinic today. You may experience some soreness afterwards and this can last for 3-4 hours.      You can take Tylenol if you do have pain following the injection, but the most important goal is to keep your shoulder moving. This procedure is not an immediate fix but rather a ???jump start??? to getting better.  It is critical that you do your home exercises every day.          Thank you for coming to [facility] and our clinic today!     We aim to provide you with the highest quality, individualized care.  If you have any unanswered questions after the visit, please do not hesitate to reach out to [place] on MyChart  or leave a message for the nurse.  ?  MyChart messages: These messages can be sent to your provider and will be checked by their clinical support staff.? The messages are checked throughout the day during normal business hours from 8:30 am-4:00 pm Monday-Friday, however responses may take up to 48 hours.? Please use this method of communication for non-urgent and non-emergent concerns, questions, refill requests or inquiries only.? ?Our team will help respond to all of your questions.? Please note that you may be asked to see a provider by either a telehealth or in person visit if it is deemed your questions are best handled in the clinic setting in person.??  ?  Please keep in mind, these messages are not real time communications, so be patient when waiting for a response.    If you do not have access to MyChart, do not know how to use MyChart or have an issue that may require more extensive discussion, please call the nurses' call line: [phone] (CP2) and [phone] ([place]).? This line is checked throughout the day and will be responded to as time allows.? Please note that return calls could take up to 48 hours, depending on the nature of the need.?  ?  If you have an issue that requires emergent attention that cannot wait; consider coming to our [facility] walk-in clinic, or go to the nearest Emergency Department.    If you need to schedule future appointments, please call [phone].     We look forward to seeing you again in the future and appreciate you choosing [facility] for your care!    Thank you,    Lilla Shook, MD, FAAFP            We provide innovative and comprehensive patient centered care that is supported by evidence-based research                                                                                                    RESEARCH PARTICIPATION    Please check out our current research studies to see if you or someone you know may qualify at:    [email or web address]

## 2022-07-04 ENCOUNTER — Ambulatory Visit: Admit: 2022-07-04 | Discharge: 2022-07-05 | Payer: MEDICARE

## 2022-07-04 DIAGNOSIS — Z125 Encounter for screening for malignant neoplasm of prostate: Principal | ICD-10-CM

## 2022-07-04 DIAGNOSIS — Z0000 Encounter for general adult medical examination without abnormal findings: Principal | ICD-10-CM

## 2022-07-04 LAB — CBC W/ AUTO DIFF
BASOPHILS ABSOLUTE COUNT: 0 10*9/L (ref 0.0–0.1)
BASOPHILS RELATIVE PERCENT: 0.2 %
EOSINOPHILS ABSOLUTE COUNT: 0 10*9/L (ref 0.0–0.5)
EOSINOPHILS RELATIVE PERCENT: 0 %
HEMATOCRIT: 43 % (ref 39.0–48.0)
HEMOGLOBIN: 14.4 g/dL (ref 12.9–16.5)
LYMPHOCYTES ABSOLUTE COUNT: 0.6 10*9/L — ABNORMAL LOW (ref 1.1–3.6)
LYMPHOCYTES RELATIVE PERCENT: 6.5 %
MEAN CORPUSCULAR HEMOGLOBIN CONC: 33.4 g/dL (ref 32.0–36.0)
MEAN CORPUSCULAR HEMOGLOBIN: 27.9 pg (ref 25.9–32.4)
MEAN CORPUSCULAR VOLUME: 83.6 fL (ref 77.6–95.7)
MEAN PLATELET VOLUME: 7.8 fL (ref 6.8–10.7)
MONOCYTES ABSOLUTE COUNT: 0.4 10*9/L (ref 0.3–0.8)
MONOCYTES RELATIVE PERCENT: 4.3 %
NEUTROPHILS ABSOLUTE COUNT: 7.9 10*9/L — ABNORMAL HIGH (ref 1.8–7.8)
NEUTROPHILS RELATIVE PERCENT: 89 %
NUCLEATED RED BLOOD CELLS: 0 /100{WBCs} (ref <=4)
PLATELET COUNT: 236 10*9/L (ref 150–450)
RED BLOOD CELL COUNT: 5.15 10*12/L (ref 4.26–5.60)
RED CELL DISTRIBUTION WIDTH: 14.4 % (ref 12.2–15.2)
WBC ADJUSTED: 8.9 10*9/L (ref 3.6–11.2)

## 2022-07-04 LAB — RENAL FUNCTION PANEL
ALBUMIN: 3.3 g/dL — ABNORMAL LOW (ref 3.4–5.0)
ANION GAP: 6 mmol/L (ref 5–14)
BLOOD UREA NITROGEN: 30 mg/dL — ABNORMAL HIGH (ref 9–23)
BUN / CREAT RATIO: 25
CALCIUM: 8.9 mg/dL (ref 8.7–10.4)
CHLORIDE: 105 mmol/L (ref 98–107)
CO2: 28.5 mmol/L (ref 20.0–31.0)
CREATININE: 1.21 mg/dL — ABNORMAL HIGH
EGFR CKD-EPI (2021) MALE: 63 mL/min/{1.73_m2} (ref >=60)
GLUCOSE RANDOM: 274 mg/dL — ABNORMAL HIGH (ref 70–179)
PHOSPHORUS: 4.4 mg/dL (ref 2.4–5.1)
POTASSIUM: 5 mmol/L — ABNORMAL HIGH (ref 3.4–4.8)
SODIUM: 139 mmol/L (ref 135–145)

## 2022-07-04 LAB — PROTIME-INR
INR: 1.68
PROTIME: 18.4 s — ABNORMAL HIGH (ref 9.9–12.6)

## 2022-07-04 LAB — PSA: PROSTATE SPECIFIC ANTIGEN: 1.14 ng/mL (ref 0.00–4.00)

## 2022-07-04 LAB — TACROLIMUS LEVEL, TROUGH: TACROLIMUS, TROUGH: 4.4 ng/mL — ABNORMAL LOW (ref 5.0–15.0)

## 2022-07-04 LAB — MAGNESIUM: MAGNESIUM: 2.1 mg/dL (ref 1.6–2.6)

## 2022-07-13 DIAGNOSIS — I4891 Unspecified atrial fibrillation: Principal | ICD-10-CM

## 2022-07-13 MED ORDER — WARFARIN 5 MG TABLET
ORAL_TABLET | 0 refills | 0 days
Start: 2022-07-13 — End: ?

## 2022-07-13 MED ORDER — METOPROLOL SUCCINATE ER 100 MG TABLET,EXTENDED RELEASE 24 HR
ORAL_TABLET | 1 refills | 0 days
Start: 2022-07-13 — End: ?

## 2022-07-14 MED ORDER — WARFARIN 5 MG TABLET
ORAL_TABLET | 3 refills | 0 days | Status: CP
Start: 2022-07-14 — End: ?

## 2022-07-16 MED ORDER — METOPROLOL SUCCINATE ER 100 MG TABLET,EXTENDED RELEASE 24 HR
ORAL_TABLET | 1 refills | 0 days | Status: CP
Start: 2022-07-16 — End: ?

## 2022-07-16 NOTE — Unmapped (Signed)
Patient is requesting the following refill  Requested Prescriptions     Pending Prescriptions Disp Refills    metoPROLOL succinate (TOPROL-XL) 100 MG 24 hr tablet [Pharmacy Med Name: METOPROLOL SUCCINATE ER 100 MG TAB] 180 tablet 1     Sig: TAKE ONE TABLET TWICE DAILY       Recent Visits  Date Type Provider Dept   04/16/22 Office Visit Min, Dorann Ou, MD [facility] Internal Medicine [facility]   02/14/22 Office Visit Min, Dorann Ou, MD [place] Internal Medicine [facility]   01/18/22 Office Visit Zenaida Deed, Elder Negus, MD [facility] Internal Medicine Cardiology   07/18/21 Office Visit Min, Dorann Ou, MD [place] Internal Medicine [facility]   Showing recent visits within past 365 days with a meds authorizing provider and meeting all other requirements  Future Appointments  Date Type Provider Dept   07/26/22 Appointment Zenaida Deed, Elder Negus, MD [facility] Internal Medicine Cardiology   10/15/22 Appointment Min, Dorann Ou, MD [place] Internal Medicine [facility]   Showing future appointments within next 365 days with a meds authorizing provider and meeting all other requirements       Labs: Vitals:   BP Readings from Last 3 Encounters:   05/22/22 165/76   05/08/22 137/70   04/16/22 120/62    and   Pulse Readings from Last 3 Encounters:   05/22/22 80   05/08/22 75   04/16/22 69

## 2022-07-18 ENCOUNTER — Ambulatory Visit: Admit: 2022-07-18 | Discharge: 2022-07-19 | Payer: MEDICARE

## 2022-07-18 LAB — PROTIME-INR
INR: 2.37
PROTIME: 25.7 s — ABNORMAL HIGH (ref 9.9–12.6)

## 2022-07-25 NOTE — Unmapped (Signed)
Complex Case Management  SUMMARY NOTE    Attempted to contact pt today at Cell number to introduce Complex Case Management services. Left message to return call.; 1st attempt    Discuss at next visit: Introduction to Complex Case Management    Kaileen Bronkema-High Risk Care Coordinator  [facility]  [address], [address]  [place], [place] [address]  She/Her/Hers  P: [phone] F: [phone]  [email or web address]

## 2022-07-26 ENCOUNTER — Ambulatory Visit: Admit: 2022-07-26 | Discharge: 2022-07-27 | Payer: MEDICARE | Attending: Internal Medicine | Primary: Internal Medicine

## 2022-07-26 DIAGNOSIS — L97421 Non-pressure chronic ulcer of left heel and midfoot limited to breakdown of skin: Principal | ICD-10-CM

## 2022-07-26 DIAGNOSIS — I739 Peripheral vascular disease, unspecified: Principal | ICD-10-CM

## 2022-07-26 DIAGNOSIS — I4891 Unspecified atrial fibrillation: Principal | ICD-10-CM

## 2022-07-26 DIAGNOSIS — R609 Edema, unspecified: Principal | ICD-10-CM

## 2022-07-26 DIAGNOSIS — E785 Hyperlipidemia, unspecified: Principal | ICD-10-CM

## 2022-07-26 DIAGNOSIS — I10 Essential (primary) hypertension: Principal | ICD-10-CM

## 2022-07-26 MED ORDER — PRAVASTATIN 40 MG TABLET
ORAL_TABLET | Freq: Every day | ORAL | 3 refills | 90 days | Status: CP
Start: 2022-07-26 — End: ?

## 2022-07-26 NOTE — Unmapped (Signed)
We will repeat the ultrasound of your legs and set you back up to meet with the vascular surgeon    INCREASE pravastatin to 40 mg daily

## 2022-07-26 NOTE — Unmapped (Signed)
Assessment and Plan:   David Ellis is a 77 y.o. 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    1. Atrial fibrillation  Overall doing very well.  Zio patch 02/2019 without recurrent atrial fibrillation.  He is regular on examination today.  Plan to continue metoprolol at current dose.  Previously reviewed anticoagulation options and he has preferred to stay on warfarin.  No changes today.  INR managed through Dr. Dorthula Rue office.    2. Hypertension  Blood pressure at goal.  Reviewed chemistry profile 06/2022, electrolytes and renal function are stable.  Continue current regimen.    3. Hyperlipidemia  Reviewed recent lipids 04/2022, non-HDL above goal of 107.  We have elected to utilize pravastatin as opposed to high intensity statin as it has achieved good control and has not affected his renal transplant meds as much.  At this point, recommend increasing pravastatin to 40 mg daily.  Patient agreeable, prescription sent to pharmacy.    4.  Edema, PAD  Patient reports edema well-controlled recently.  Does have significant venous stasis changes bilaterally.  Patient and wife also concerned about PAD and history of wounds in his legs.  She has noted more redness in right lower extremity, this is somewhat cool to touch.  Has been lost to follow-up by vascular surgery.  They express interest in reestablishing care.  Reviewed arterial duplex 08/2020 showing hemodynamically significant stenosis of right popliteal artery as well as left lower extremity runoff disease. I have reordered arterial duplex and submitted vascular surgery referral to reestablish care.  Continue warfarin, uptitrating statin as above.    I personally spent greater than 40 minutes in face-to-face and non-face-to-face care of this patient, which includes all pre, intra, and post visit time on the date of service.    Vevelyn Francois, MD  [facility] Internal Medicine--Cardiology  Office [phone]  Office [phone]      Subjective:   PCP: Sharlee Blew, MD  Patient: David Ellis  DOB: 17-May-1946    Reason for visit:  Afib, HTN, RV failure  HPI: David Ellis is a 77 y.o. 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  Since his last visit, he denies chest pain, shortness of breath, palpitations, or near syncope.  His wife expresses concern about his right leg.  She has noted new redness in that leg.  Patient reports no issues with lower extremity wounds recently.  He does note feeling blood rushing to his head when he bends over.    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 Wedge defects in left lung without any thrombus visualized, could represent chronic changes or small subsegmental PEs.    Risk Factors: HTN, DM     Cath / PCI:   None    CV Surgery:   None  EP Procedures and Devices:   None    Non-Invasive Evaluation(s):   Arterial duplex 08/2020: Severe right popliteal stenosis and runoff disease.  Left lower extremity runoff disease  Ziopatch 9/20: Predominantly sinus rhythm with rates 57 to 100 bpm, average heart rate 75 bpm.  71 episodes of SVT, longest lasting 17 beats at 91 bpm.  Rare PVCs, brief idioventricular rhythm noted.  No wide-complex tachycardia 2.5% PVC burden.  TTE 3/17: Normal EF, LVH, grade 1 diastolic dysfunction  Ziopatch 2/16: Rare SVE and VE. 29 episodes of SVT, longest episode lasting 20 beats. No atrial fibrillation identified.  TTE from 06/30/13: Normal EF 55-60%, mildly dilated RV, mild RV dysfunction.  Nuclear stress test 07/2011:  Normal myocardial perfusion study  No evidence for significant ischemia or scar EF 62% and RV is mildly dilated.  Minimal coronary calcification is noted involving the LAD and RCA    ______________________________________________________________________    Other past medical history, social history, family history, medications, allergies and problem list reviewed in the medical record.    Current cardiac medications include:   Metoprolol 100 mg twice daily  ASA 81 mg as needed for travel due to VTE history  Furosemide 40 mg once daily with additional dose as needed  Warfarin  Pravastatin 20 mg daily, increase to 40 mg daily today  Losartan 25 mg daily  Jardiance 5 mg daily      Objective:     BP 116/64 (BP Site: R Arm, BP Position: Sitting, BP Cuff Size: Large)  - Pulse 74  - Wt (!) 108.4 kg (239 lb)  - SpO2 96%  - BMI 31.53 kg/m??     PHYSICAL EXAMINATION:   GENERAL:  Alert, NAD  CARDIOVASCULAR:  Regular rate and rhythm, soft S1/S2, no murmurs, rubs, or gallops.  1+ b/l LE lower extremity edema with venous stasis changes bilaterally.  Right lower extremity cooler to touch than left lower extremity  RESPIRATORY:  Clear to auscultation bilaterally.  No wheezes, crackles, or rhonchi. Normal work of breathing.    ______________________________________________________________________  EKG 01/2022 shows normal sinus rhythm, left axis deviation, first-degree AV block, poor wave progression    Recent CV pertinent labs:  Lab Results   Component Value Date    LDL Calculated 80 04/16/2022    LDL Calculated 74 04/21/2015    Non-HDL Cholesterol 107 04/16/2022    HDL 35 (L) 04/16/2022    HDL 36 (L) 04/21/2015 INR, POC 3.09 (A) 05/19/2019    INR 2.37 07/18/2022    INR 1.9 (A) 06/02/2019    PRO-BNP 375 (H) 06/29/2014    Creatinine 1.21 (H) 07/04/2022    Creatinine 1.7 (H) 07/09/2017    Creatinine 1.36 08/09/2014    Potassium 5.0 (H) 07/04/2022    Potassium 4.6 07/09/2017    BUN 30 (H) 07/04/2022    BUN 49.00 (H) 07/09/2017    TSH 1.164 04/02/2022    TSH 1.20 07/02/2011       Chemistry        Component Value Date/Time    NA 139 07/04/2022 0812    NA 139 07/09/2017 1358    K 5.0 (H) 07/04/2022 0812    K 4.6 07/09/2017 1358    CL 105 07/04/2022 0812    CL 104 07/09/2017 1358    CO2 28.5 07/04/2022 0812    CO2 24.2 07/09/2017 1358    BUN 30 (H) 07/04/2022 0812    BUN 49.00 (H) 07/09/2017 1358    CREATININE 1.21 (H) 07/04/2022 1610  CREATININE 1.7 (H) 07/09/2017 1358    GLU 274 (H) 07/04/2022 0812        Component Value Date/Time    CALCIUM 8.9 07/04/2022 0812    CALCIUM 8.9 07/09/2017 1358    ALKPHOS 91 05/22/2022 1635    ALKPHOS 91 04/21/2015 0858    AST 23 05/22/2022 1635    AST 24 04/21/2015 0858    ALT 16 05/22/2022 1635    ALT 22 04/21/2015 0858    BILITOT 0.4 05/22/2022 1635    BILITOT 0.5 04/21/2015 1610

## 2022-07-27 DIAGNOSIS — I739 Peripheral vascular disease, unspecified: Principal | ICD-10-CM

## 2022-07-27 NOTE — Unmapped (Signed)
Pt wife LM stating they have called 4 different pharmacies, including [facility], and no one has Trulicity in stock or has a reliable supplier for it.   Requesting advice for possible alternative medication until Trulicity can be obtained.     Msg routed to Dr Tiburcio Pea.

## 2022-07-28 NOTE — Unmapped (Unsigned)
Prior authorization (PA) for OZEMPIC 0.25/0.5 MG submitted in covermymeds on 07/27/22:  Key: [id number]   PA Case ID: [id number]   Status: PENDING    PA submission notes: N/A    Saddie Benders?? PharmD, CPP, BCACP, CDCES, completed 07/27/22 18:18

## 2022-07-28 NOTE — Unmapped (Signed)
Patient currently prescribed Trulicity 1.5 mg, but unable to refill medication due to ongoing national shortages. Trulicity 1.5 mg is the only strength impacted. Last A1c = 7.6% in October 2023.     Patient has the option to uptitrate from Trulicity 1.5 mg to 3 mg OR switch to weekly Ozempic (0.5 mg = 1.5 mg Trulicity) vs. daily Victoza (1.8 mg = 1.5 mg Trulicity). Given that patient is also prescribed basal and prandial insulin, would favor conversion to Ozempic, which will likely need a PA. Additionally, Victoza is also on shortage through March 2024.  Mounjaro appears to be non-formulary/preferred on this patient's plan.     Message forwarded to Dr. Tiburcio Pea for her awareness.     Saddie Benders?? PharmD, CPP, BCACP, CDCES, completed 07/27/22 18:03

## 2022-07-28 NOTE — Unmapped (Signed)
Referral received, imaging orders entered, message sent ok to schedule

## 2022-07-31 NOTE — Unmapped (Signed)
[facility] Refill Coordination Note    Specialty Medication(s) to be Shipped:   Transplant: Myfortic 180mg  and tacrolimus 1mg     Other medication(s) to be shipped: No additional medications requested for fill at this time     David Ellis, DOB: Nov 19, 1945  Phone: [phone] (home)       All above HIPAA information was verified with patient.     Was a Nurse, learning disability used for this call? No    Completed refill call assessment today to schedule patient's medication shipment from the [facility] [phone]).  All relevant notes have been reviewed.     Specialty medication(s) and dose(s) confirmed: Regimen is correct and unchanged.   Changes to medications: Haran reports no changes at this time.  Changes to insurance: No  New side effects reported not previously addressed with a pharmacist or physician: None reported  Questions for the pharmacist: No    Confirmed patient received a Conservation officer, historic buildings and a Surveyor, mining with first shipment. The patient will receive a drug information handout for each medication shipped and additional FDA Medication Guides as required.       DISEASE/MEDICATION-SPECIFIC INFORMATION        N/A    SPECIALTY MEDICATION ADHERENCE     Medication Adherence    Patient reported X missed doses in the last month: 0  Specialty Medication: MYFORTIC 180 MG EC tablet (mycophenolate)  Patient is on additional specialty medications: Yes  Additional Specialty Medications: tacrolimus 1 MG capsule (PROGRAF)  Patient Reported Additional Medication X Missed Doses in the Last Month: 0  Patient is on more than two specialty medications: No  Adherence tools used: patient uses a pill box to manage medications  Support network for adherence: family member              Were doses missed due to medication being on hold? No    MYFORTIC 180  mg: 7 days of medicine on hand   tacrolimus 1 mg: 7 days of medicine on hand       REFERRAL TO PHARMACIST     Referral to the pharmacist: Not needed      [facility]     Shipping address confirmed in Epic.     Delivery Scheduled: Yes, Expected medication delivery date: 08/03/22.     Medication will be delivered via Same Day Courier to the prescription address in Epic WAM.    David Ellis   [facility] Specialty Technician

## 2022-08-01 MED ORDER — OZEMPIC 0.25 MG OR 0.5 MG (2 MG/3 ML) SUBCUTANEOUS PEN INJECTOR
SUBCUTANEOUS | 3 refills | 84.00000 days | Status: CP
Start: 2022-08-01 — End: 2022-08-01

## 2022-08-01 NOTE — Unmapped (Signed)
Pt has not read my 07/28/22 mychart reply to him.    Please notify him that I have sent alternate ozempic which he should start taking 0.5mg  once weekly when out of trulicity 1.5mg  weekly (these doses are roughly equivalent).     Pharm D Kome has already obtained PA for the change to ozempic.     Tyou-

## 2022-08-02 DIAGNOSIS — E1129 Type 2 diabetes mellitus with other diabetic kidney complication: Principal | ICD-10-CM

## 2022-08-02 MED ORDER — OZEMPIC 0.25 MG OR 0.5 MG (2 MG/3 ML) SUBCUTANEOUS PEN INJECTOR
SUBCUTANEOUS | 3 refills | 84 days | Status: CP
Start: 2022-08-02 — End: ?
  Filled 2022-08-14: qty 3, 28d supply, fill #0

## 2022-08-02 NOTE — Unmapped (Signed)
Called patient and informed him per Dr. Tiburcio Pea, Please notify him that I have sent alternate ozempic which he should start taking 0.5mg  once weekly when out of trulicity 1.5mg  weekly (these doses are roughly equivalent). Pharm D Kome has already obtained PA for the change to ozempic.     Patient verbalized understanding. Patient states he will call [facility] pharmacy and see if they have Ozempic 0.5 mg in stock. Patient states he prefers [facility] over [facility] [employer]. If Shared services has the Ozempic 0.5 mg in stock, he can notify our clinic and we can assist with transferring prescription.

## 2022-08-02 NOTE — Unmapped (Signed)
Patient informed Dr. Tiburcio Pea has received his message and that she has sent a prescription for Ozempic 0.5 mg to [facility]. Patient verbalized understanding.

## 2022-08-03 MED FILL — MYFORTIC 180 MG TABLET,DELAYED RELEASE: ORAL | 30 days supply | Qty: 180 | Fill #9

## 2022-08-03 MED FILL — TACROLIMUS 1 MG CAPSULE, IMMEDIATE-RELEASE: ORAL | 30 days supply | Qty: 150 | Fill #4

## 2022-08-07 NOTE — Unmapped (Signed)
Complex Case Management  SUMMARY NOTE    Attempted to contact pt today at Cell number to introduce Complex Case Management services. Left message to return call.; 2nd attempt, letter sent.    Discuss at next visit: Introduction to Complex Case Management    Beulah Gandy Risk Care Coordinator  [facility]  [address]  [place], [place] [address]  She/Her/Hers  P: [phone] F: [phone]  [email or web address]      .

## 2022-08-08 NOTE — Unmapped (Addendum)
Complex Case Management  SUMMARY NOTE    High Risk Care Coordinator  spoke with patient and verified correct patient using two identifiers today to introduce the Complex Case Management program.     Discussed the following:  Program Services, Expectations of participation, and Verified Demographics    Program status: Declined    Nakeia Calvi-High Risk Care Coordinator  [facility]  [address], [address]  [place], [place] [address]  She/Her/Hers  P: [phone] F: [phone]  [email or web address]

## 2022-08-08 NOTE — Unmapped (Signed)
COMPLEX CASE MANAGEMENT   Brief Note        [facility] received a call from patient in response to message left. Patient is interested in Complex Case Management Services and would like a call back at 3:00 pm today to complete initial assessment.    Beulah Gandy Risk Care Coordinator  [facility]  [address], [address]  [place], [place] [address]  She/Her/Hers  P: [phone] F: [phone]  [email or web address]

## 2022-08-10 NOTE — Unmapped (Signed)
Returned pt call. Discussed timing of labs, imaging and neph visit on 3/27. He will get labs prior to seeing [facility] and is aware imaging is at [facility]

## 2022-08-14 ENCOUNTER — Ambulatory Visit: Admit: 2022-08-14 | Discharge: 2022-08-15 | Payer: MEDICARE

## 2022-08-14 DIAGNOSIS — E1129 Type 2 diabetes mellitus with other diabetic kidney complication: Principal | ICD-10-CM

## 2022-08-14 DIAGNOSIS — H4311 Vitreous hemorrhage, right eye: Principal | ICD-10-CM

## 2022-08-14 DIAGNOSIS — H4312 Vitreous hemorrhage, left eye: Principal | ICD-10-CM

## 2022-08-14 DIAGNOSIS — E113593 Type 2 diabetes mellitus with proliferative diabetic retinopathy without macular edema, bilateral: Principal | ICD-10-CM

## 2022-08-14 DIAGNOSIS — Z961 Presence of intraocular lens: Principal | ICD-10-CM

## 2022-08-14 DIAGNOSIS — H35373 Puckering of macula, bilateral: Principal | ICD-10-CM

## 2022-08-14 MED ORDER — TRULICITY 1.5 MG/0.5 ML SUBCUTANEOUS PEN INJECTOR
SUBCUTANEOUS | 11 refills | 28 days
Start: 2022-08-14 — End: ?

## 2022-08-14 NOTE — Unmapped (Unsigned)
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    EXTENDED OPHTHALMOSCOPY RESULTS:  OD: MAs, PRP 360  OS: MAs, PRP 360

## 2022-08-16 MED ORDER — TRULICITY 1.5 MG/0.5 ML SUBCUTANEOUS PEN INJECTOR
SUBCUTANEOUS | 11 refills | 28 days | Status: CP
Start: 2022-08-16 — End: 2023-08-16

## 2022-08-21 DIAGNOSIS — Z1159 Encounter for screening for other viral diseases: Principal | ICD-10-CM

## 2022-08-21 DIAGNOSIS — Z940 Kidney transplant status: Principal | ICD-10-CM

## 2022-08-21 DIAGNOSIS — Z79899 Other long term (current) drug therapy: Principal | ICD-10-CM

## 2022-08-21 MED ORDER — PEN NEEDLE 32 GAUGE X 5/32" (4 MM)
3 refills | 0 days | Status: CP
Start: 2022-08-21 — End: ?

## 2022-08-22 NOTE — Unmapped (Signed)
[facility] Clinical Assessment & Refill Coordination Note    David Ellis, DOB: 08-25-1945  Phone: [phone] (home)     All above HIPAA information was verified with patient.     Was a Nurse, learning disability used for this call? No    Specialty Medication(s):   Transplant: Myfortic 180mg  and tacrolimus 1mg      Current Outpatient Medications   Medication Sig Dispense Refill    acetaminophen (TYLENOL) 325 MG tablet Take by mouth.      aspirin (ECOTRIN) 81 MG tablet Take 1 tablet (81 mg total) by mouth daily as needed (when flying). (Patient not taking: Reported on 04/16/2022)      blood-glucose meter kit Use as instructed. 1 each 0    cholecalciferol, vitamin D3-50 mcg, 2,000 unit,, 50 mcg (2,000 unit) tablet Take 1 tablet (50 mcg total) by mouth daily.      dulaglutide (TRULICITY) 1.5 mg/0.5 mL PnIj Inject 0.5 mL (1.5 mg total) under the skin every seven (7) days. (Patient not taking: Reported on 08/22/2022) 2 mL 11    dulaglutide (TRULICITY) 1.5 mg/0.5 mL PnIj Inject 0.5 mL (1.5 mg total) under the skin once a week. 2 mL 11    empagliflozin (JARDIANCE) 10 mg tablet Take 1 tablet (10 mg total) by mouth every morning. 90 tablet 3    empagliflozin (JARDIANCE) 10 mg tablet Take 1 tablet (10 mg total) by mouth daily. (Patient not taking: Reported on 07/26/2022)      flash glucose sensor (FREESTYLE LIBRE 14 DAY SENSOR) kit by Other route every fourteen (14) days. 2 each 11    furosemide (LASIX) 40 MG tablet Take 1 tablet (40 mg total) by mouth daily. 40 mg daily. And 1 additional tablet for edema.      gabapentin (NEURONTIN) 300 MG capsule TAKE 1 CAPSULE BY MOUTH TWICE DAILY AS DIRECTED 180 capsule 3    HUMALOG KWIKPEN INSULIN 100 unit/mL injection pen INJECT 15-25 UNITS WITH MEALS + CORRECTION **MAX 100 UNITS A DAY** 60 mL 3    hydrocortisone 2.5 % cream       insulin degludec 200 unit/mL (3 mL) InPn Inject 0.4 mL (80 Units total) under the skin daily. Titrate up to 100units daily as instructed. 45 mL 3    insulin glargine U-300 conc (TOUJEO SOLOSTAR) 300 unit/mL (1.5 mL) injection pen Inject 70 Units under the skin daily. 24 mL 3    ketoconazole (NIZORAL) 2 % cream       LOKELMA 10 gram PwPk packet DOSE: 1 PACKET (10 grams) every other day.  Empty entire contents of ONE packet into a glass with 45 mL of water. Stir well and drink immediately; if powder remains in the glass, add water, stir and drink immediately; repeat until no powder remains. 11 packet 0    losartan (COZAAR) 25 MG tablet TAKE 1 TABLET BY MOUTH DAILY. 90 tablet 3    magnesium chloride (SLOW_MAG) 64 mg TbEC Take 128 mg by mouth three (3) times a day (at 6am, noon and 6pm). (2 tablets) 9am 1 pm and 9 pm      metoPROLOL succinate (TOPROL-XL) 100 MG 24 hr tablet TAKE ONE TABLET TWICE DAILY 180 tablet 1    miscellaneous medical supply Misc 1 application by Miscellaneous route daily as needed. Lightweight wheelchair. 1 each 0    miscellaneous medical supply Misc 1 application by Miscellaneous route nightly. Recommend CPAP 14 cm H2O with EPR 3 and heated humidifier for nasal dryness, mask: ResMed  Activa Lt nasal mask (Large). 1 each 0    multivitamin capsule Take 1 capsule by mouth daily.      MYFORTIC 180 mg EC tablet Take 3 tablets (540 mg total) by mouth two (2) times a day. 540 tablet 3    PEN NEEDLE 32 gauge x 5/32 (4 mm) Ndle USE 3 TIMES DAILY BEFORE MEALS 300 each 3    pravastatin (PRAVACHOL) 40 MG tablet Take 1 tablet (40 mg total) by mouth daily. 90 tablet 3    semaglutide (OZEMPIC) 0.25 mg or 0.5 mg (2 mg/3 mL) PnIj Inject 0.5 mg under the skin once a week. 9 mL 3    tacrolimus (PROGRAF) 1 MG capsule Take 3 capsules (3 mg total) by mouth in the morning AND 2 capsules (2 mg total) in the evening. 450 capsule 3    warfarin (JANTOVEN) 5 MG tablet TAKE 1 TABLET BY MOUTH DAILY WITH EVENING MEAL. TAKE 6 DAYS A WEEK (TUESDAY THROUGH SUNDAY) AS DIRECTED. 90 tablet 3     No current facility-administered medications for this visit. Facility-Administered Medications Ordered in Other Visits   Medication Dose Route Frequency Provider Last Rate Last Admin    bevacizumab    PRN (once a day) Melvyn Novas, MD   2.5 mg at 01/28/17 0750        Changes to medications: David Ellis reports no changes at this time.    Allergies   Allergen Reactions    Penicillin G Other (See Comments)    Egg Derived Rash    Enalapril Cough    Epinephrine Palpitations    Grass Pollen-[place], Standard Itching    Levofloxacin Other (See Comments)     Per patient    Lisinopril Rash    Mepivacaine Hcl Palpitations    Penicillins Other (See Comments)     As child  Tolerated amp/sulbactam without side effects during 10/2014 hospitalization       Changes to allergies: No    SPECIALTY MEDICATION ADHERENCE     Myfortic 180mg   : 12 days of medicine on hand   Tacrolimus 1mg   : 12 days of medicine on hand       Medication Adherence    Patient reported X missed doses in the last month: 0  Specialty Medication: myfortic 180mg   Patient is on additional specialty medications: Yes  Additional Specialty Medications: Tacrolimus 1mg   Patient Reported Additional Medication X Missed Doses in the Last Month: 0  Adherence tools used: patient uses a pill box to manage medications  Support network for adherence: family member          Specialty medication(s) dose(s) confirmed: Regimen is correct and unchanged.     Are there any concerns with adherence? No    Adherence counseling provided? Not needed    CLINICAL MANAGEMENT AND INTERVENTION      Clinical Benefit Assessment:    Do you feel the medicine is effective or helping your condition? Yes    Clinical Benefit counseling provided? Not needed    Adverse Effects Assessment:    Are you experiencing any side effects? No    Are you experiencing difficulty administering your medicine? No    Quality of Life Assessment:    Quality of Life    Rheumatology  Oncology  Dermatology  Cystic Fibrosis          How many days over the past month did your transplant  keep you from your normal activities? For example, brushing your teeth or getting up  in the morning. 0    Have you discussed this with your provider? Not needed    Acute Infection Status:    Acute infections noted within Epic:  MDR Acinetobacter, MRSA  Patient reported infection: None    Therapy Appropriateness:    Is therapy appropriate and patient progressing towards therapeutic goals? Yes, therapy is appropriate and should be continued    DISEASE/MEDICATION-SPECIFIC INFORMATION      N/A    Solid Organ Transplant: Not Applicable    PATIENT SPECIFIC NEEDS     Does the patient have any physical, cognitive, or cultural barriers? No    Is the patient high risk? Yes, patient is taking a REMS drug. Medication is dispensed in compliance with REMS program    Did the patient require a clinical intervention? No    Does the patient require physician intervention or other additional services (i.e., nutrition, smoking cessation, social work)? No    SOCIAL DETERMINANTS OF HEALTH     At [facility], we have learned that life circumstances - like trouble affording food, housing, utilities, or transportation can affect the health of many of our patients.   That is why we wanted to ask: are you currently experiencing any life circumstances that are negatively impacting your health and/or quality of life? No    Social Determinants of Health     Financial Resource Strain: Low Risk  (07/19/2020)    Overall Financial Resource Strain (CARDIA)     Difficulty of Paying Living Expenses: Not very hard   Internet Connectivity: Not on file   Food Insecurity: No Food Insecurity (07/19/2020)    Hunger Vital Sign     Worried About Running Out of Food in the Last Year: Never true     Ran Out of Food in the Last Year: Never true   Tobacco Use: Low Risk  (08/14/2022)    Patient History     Smoking Tobacco Use: Never     Smokeless Tobacco Use: Never     Passive Exposure: Never   Housing/Utilities: Low Risk  (07/19/2020) Housing/Utilities     Within the past 12 months, have you ever stayed: outside, in a car, in a tent, in an overnight shelter, or temporarily in someone else's home (i.e. couch-surfing)?: No     Are you worried about losing your housing?: No     Within the past 12 months, have you been unable to get utilities (heat, electricity) when it was really needed?: No   Alcohol Use: Not At Risk (07/19/2020)    Alcohol Use     How often do you have a drink containing alcohol?: Never     How many drinks containing alcohol do you have on a typical day when you are drinking?: 1 - 2     How often do you have 5 or more drinks on one occasion?: Never   Transportation Needs: No Transportation Needs (07/19/2020)    PRAPARE - Transportation     Lack of Transportation (Medical): No     Lack of Transportation (Non-Medical): No   Substance Use: Low Risk  (07/19/2020)    Substance Use     Taken prescription drugs for non-medical reasons: Never     Taken illegal drugs: Never     Patient indicated they have taken drugs in the past year for non-medical reasons: Yes, [positive answer(s)]: Not on file   Health Literacy: Low Risk  (07/19/2020)    Health Literacy     : Never  Physical Activity: Sufficiently Active (07/19/2020)    Exercise Vital Sign     Days of Exercise per Week: 5 days     Minutes of Exercise per Session: 30 min   Interpersonal Safety: Not on file   Stress: No Stress Concern Present (07/19/2020)    [employer] of Occupational Health - Occupational Stress Questionnaire     Feeling of Stress : Only a little   Intimate Partner Violence: Not At Risk (07/19/2020)    Humiliation, Afraid, Rape, and Kick questionnaire     Fear of Current or Ex-Partner: No     Emotionally Abused: No     Physically Abused: No     Sexually Abused: No   Depression: At risk (08/21/2021)    PHQ-2     PHQ-2 Score: 4   Social Connections: Moderately Integrated (07/19/2020)    Social Connection and Isolation Panel [NHANES]     Frequency of Communication with Friends and Family: More than three times a week     Frequency of Social Gatherings with Friends and Family: Once a week     Attends Religious Services: 1 to 4 times per year     Active Member of [employer] or Organizations: No     Attends Banker Meetings: Never     Marital Status: Married       Would you be willing to receive help with any of the needs that you have identified today? Not applicable       SHIPPING     Specialty Medication(s) to be Shipped:   Transplant: Myfortic 180mg  and tacrolimus 1mg     Other medication(s) to be shipped: No additional medications requested for fill at this time     Changes to insurance: No    Patient was informed of new phone menu: No    Delivery Scheduled: Yes, Expected medication delivery date: 08/30/2022.     Medication will be delivered via Next Day Courier to the confirmed prescription address in [facility].    The patient will receive a drug information handout for each medication shipped and additional FDA Medication Guides as required.  Verified that patient has previously received a Conservation officer, historic buildings and a Surveyor, mining.    The patient or caregiver noted above participated in the development of this care plan and knows that they can request review of or adjustments to the care plan at any time.      All of the patient's questions and concerns have been addressed.    [name], PharmD   [facility] Specialty Pharmacist

## 2022-08-27 NOTE — Unmapped (Signed)
TRF UNOS form

## 2022-08-29 ENCOUNTER — Ambulatory Visit: Admit: 2022-08-29 | Discharge: 2022-08-30 | Payer: MEDICARE

## 2022-08-29 ENCOUNTER — Encounter: Admit: 2022-08-29 | Discharge: 2022-08-30 | Payer: MEDICARE | Attending: Nephrology | Primary: Nephrology

## 2022-08-29 ENCOUNTER — Ambulatory Visit: Admit: 2022-08-29 | Discharge: 2022-08-30 | Payer: MEDICARE | Attending: Nephrology | Primary: Nephrology

## 2022-08-29 DIAGNOSIS — Z1159 Encounter for screening for other viral diseases: Principal | ICD-10-CM

## 2022-08-29 DIAGNOSIS — Z940 Kidney transplant status: Principal | ICD-10-CM

## 2022-08-29 DIAGNOSIS — Z79899 Other long term (current) drug therapy: Principal | ICD-10-CM

## 2022-08-29 LAB — MAGNESIUM: MAGNESIUM: 2.1 mg/dL (ref 1.6–2.6)

## 2022-08-29 LAB — PROTEIN / CREATININE RATIO, URINE
CREATININE, URINE: 112.5 mg/dL
PROTEIN URINE: 37.1 mg/dL
PROTEIN/CREAT RATIO, URINE: 0.33

## 2022-08-29 LAB — COMPREHENSIVE METABOLIC PANEL
ALBUMIN: 3.4 g/dL (ref 3.4–5.0)
ALKALINE PHOSPHATASE: 90 U/L (ref 46–116)
ALT (SGPT): 12 U/L (ref 10–49)
ANION GAP: 5 mmol/L (ref 5–14)
AST (SGOT): 22 U/L (ref <=34)
BILIRUBIN TOTAL: 0.6 mg/dL (ref 0.3–1.2)
BLOOD UREA NITROGEN: 29 mg/dL — ABNORMAL HIGH (ref 9–23)
BUN / CREAT RATIO: 23
CALCIUM: 9.1 mg/dL (ref 8.7–10.4)
CHLORIDE: 105 mmol/L (ref 98–107)
CO2: 29.6 mmol/L (ref 20.0–31.0)
CREATININE: 1.26 mg/dL — ABNORMAL HIGH
EGFR CKD-EPI (2021) MALE: 59 mL/min/{1.73_m2} — ABNORMAL LOW (ref >=60)
GLUCOSE RANDOM: 77 mg/dL (ref 70–99)
POTASSIUM: 4.7 mmol/L (ref 3.4–4.8)
PROTEIN TOTAL: 6.8 g/dL (ref 5.7–8.2)
SODIUM: 140 mmol/L (ref 135–145)

## 2022-08-29 LAB — URINALYSIS WITH MICROSCOPY
BACTERIA: NONE SEEN /HPF
BILIRUBIN UA: NEGATIVE
BLOOD UA: NEGATIVE
GLUCOSE UA: >1000 — AB
KETONES UA: NEGATIVE
LEUKOCYTE ESTERASE UA: NEGATIVE
NITRITE UA: NEGATIVE
PH UA: 5.5 (ref 5.0–9.0)
PROTEIN UA: 30 — AB
RBC UA: 2 /HPF (ref <=3)
SPECIFIC GRAVITY UA: 1.021 (ref 1.003–1.030)
SQUAMOUS EPITHELIAL: <1 /HPF (ref 0–5)
UROBILINOGEN UA: <2
WBC UA: 1 /HPF (ref <=2)

## 2022-08-29 LAB — CBC W/ AUTO DIFF
BASOPHILS ABSOLUTE COUNT: 0 10*9/L (ref 0.0–0.1)
BASOPHILS RELATIVE PERCENT: 0.2 %
EOSINOPHILS ABSOLUTE COUNT: 0.1 10*9/L (ref 0.0–0.5)
EOSINOPHILS RELATIVE PERCENT: 0.7 %
HEMATOCRIT: 43.8 % (ref 39.0–48.0)
HEMOGLOBIN: 14.6 g/dL (ref 12.9–16.5)
LYMPHOCYTES ABSOLUTE COUNT: 0.9 10*9/L — ABNORMAL LOW (ref 1.1–3.6)
LYMPHOCYTES RELATIVE PERCENT: 8.9 %
MEAN CORPUSCULAR HEMOGLOBIN CONC: 33.4 g/dL (ref 32.0–36.0)
MEAN CORPUSCULAR HEMOGLOBIN: 27.9 pg (ref 25.9–32.4)
MEAN CORPUSCULAR VOLUME: 83.6 fL (ref 77.6–95.7)
MEAN PLATELET VOLUME: 7.7 fL (ref 6.8–10.7)
MONOCYTES ABSOLUTE COUNT: 0.7 10*9/L (ref 0.3–0.8)
MONOCYTES RELATIVE PERCENT: 6.6 %
NEUTROPHILS ABSOLUTE COUNT: 8.4 10*9/L — ABNORMAL HIGH (ref 1.8–7.8)
NEUTROPHILS RELATIVE PERCENT: 83.6 %
PLATELET COUNT: 219 10*9/L (ref 150–450)
RED BLOOD CELL COUNT: 5.24 10*12/L (ref 4.26–5.60)
RED CELL DISTRIBUTION WIDTH: 14.7 % (ref 12.2–15.2)
WBC ADJUSTED: 10.1 10*9/L (ref 3.6–11.2)

## 2022-08-29 LAB — HEMOGLOBIN A1C
ESTIMATED AVERAGE GLUCOSE: 157 mg/dL
HEMOGLOBIN A1C: 7.1 % — ABNORMAL HIGH (ref 4.8–5.6)

## 2022-08-29 LAB — PHOSPHORUS: PHOSPHORUS: 3.3 mg/dL (ref 2.4–5.1)

## 2022-08-29 MED FILL — TACROLIMUS 1 MG CAPSULE, IMMEDIATE-RELEASE: ORAL | 30 days supply | Qty: 150 | Fill #5

## 2022-08-29 MED FILL — MYFORTIC 180 MG TABLET,DELAYED RELEASE: ORAL | 30 days supply | Qty: 180 | Fill #10

## 2022-08-29 NOTE — Unmapped (Signed)
Transplant Nephrology Telephone Visit    Assessment and Plan    David Ellis is a 77 y.o. 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 He is seen today for his annual evaluation in the transplant clinic. He continues to follow closely with Dr. Arvin Collard.    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   - Serum creatinine is 1.26 mg/dL (baseline 0.9-8.1 mg/dL).   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   - He has a remote history of a HLA-DQ 7 donor specific antibody (noted on 09/17/2011) with all subsequent HLA antibody screens revealing no DSA's (most recent 08/29/22)  - UP/C today is 0.330 consistent with findings over the past 3 years.   - BK viral load undetectable 08/29/22  - Diabetic nephropathy is the most likely cause of the sub-nephrotic proteinuria.   - Continue losartan and Jardiance    Immunosuppression management.    - He is tolerating his current immunosuppressive regimen well.   - Tacrolimus target level is 3.5 and just below target of 4-7   - Will continue mycophenolate sodium 540 mg twice daily and tacrolimus at 3 mg AM/2 mg PM.     BP management  - Has long-standing history of hypertension  - BP today is 96/68 with recent history of abdominal pain and nausea since resuming GLP-1 agonist therapy with Ozempic 0.5 mg weekly.   - Current antihypertensive regimen includes metoprolol succinate 100 mg bid, losartan 25 mg daily, and furosemide 40 mg daily and prn edema  - Will have patient hold furosemide until BP improves      Immunizations/Infection Prevention.   - Pneumococcal vaccines 07/13/16 Prevnar-13 08/26/13. Prevnar-20 now due  - Flu vaccine 02/14/22  - Shingrix x 2 completed  - COVID-19 vaccine x 6 doses, most recently 04/18/22  - RSV vaccine 04/18/22     History of  DVT and pulmonary embolus.   - He will continue Coumadin. Diabetes mellitus, type 2.    - Hemoglobin A1c 7.1.   - Treatment includes Ozempic 0.5 mg weekly, Jardiance and humulin with CGM  - He will follow in Endocrinology clinic and with Dr. Arvin Collard.     Atrial fibrillation, S/P ablation.  - Denies palpitations, exam with regular rhythm today  - He will continue Coumadin and metoprolol XL    CVA, status post alteplase 07/2015.   - Denies new neurologic symptoms or residual deficits.  - Continue coumadin and statin.    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  - No active foot ulcers or claudication at this time    Post-transplant hyperparathyroidism, CKD-MBD.    - Serum Ca 9.1, phos 3.3    Cancer screening.   - He sees Dermatology at least every 6 months  - Renal US today (08/29/22) reveals no renal masses.   - Colonoscopy 02/27/22 with fragments of tubular adenomas   - PSA 1.14 on 07/04/22    Obesity  - BMI 31.35 kg/m2 down from 34.07 kg/m2 in 08/2020   - Tolerated Trulicity well, but is experiencing GI symptoms with Ozempic.     Follow-up.    - He will continue to follow with his multiple physicians.  He will be seen in transplant clinic in 1 year.     History of Present Illness    Transplant History:  David Ellis is  77 y.o. 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  He has no history of rejection with baseline serum creatinine 1.2-1.7 mg/dL. 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    His posttransplant course was complicated by a pulmonary embolus in December 2014, foot infections necessitating amputation of 2 toes, and a stroke in February 2017.    Interval history:   He is seen today for his annual transplant visit. He complains of dizziness upon standing. He lost weight on Trulicity then was unable to fill the medication due to shortage in supply. He started Ozempic 0.5 mg weekly on 07/27/22 and has been experiencing nausea and intermittent abdominal pain. BP has been lower than usual. He denies dysuria, allograft tenderness, fever, chills, diarrhea, palpitations, chest pain, headaches or tremors. He has dyspnea with exertion and peripheral edema but these are chronic symptoms that are not worse. He denies foot ulcers or claudication. He continues to have unsteady gait related to balance problems due to neuropathy. He denies focal neurologic deficits.      Review of Systems    All other systems are reviewed and are negative. A 10 systems review is completed.    Medications    Current Outpatient Medications   Medication Sig Dispense Refill    acetaminophen (TYLENOL) 325 MG tablet Take by mouth.      blood-glucose meter kit Use as instructed. 1 each 0    cholecalciferol, vitamin D3-50 mcg, 2,000 unit,, 50 mcg (2,000 unit) tablet Take 1 tablet (50 mcg total) by mouth daily.      empagliflozin (JARDIANCE) 10 mg tablet Take 1 tablet (10 mg total) by mouth every morning. (Patient taking differently: Take 0.5 tablets (5 mg total) by mouth every morning.) 90 tablet 3    flash glucose sensor (FREESTYLE LIBRE 14 DAY SENSOR) kit by Other route every fourteen (14) days. 2 each 11    furosemide (LASIX) 40 MG tablet Take 1 tablet (40 mg total) by mouth daily. 40 mg daily. And 1 additional tablet for edema.      gabapentin (NEURONTIN) 300 MG capsule TAKE 1 CAPSULE BY MOUTH TWICE DAILY AS DIRECTED 180 capsule 3    HUMALOG KWIKPEN INSULIN 100 unit/mL injection pen INJECT 15-25 UNITS WITH MEALS + CORRECTION **MAX 100 UNITS A DAY** 60 mL 3    hydrocortisone 2.5 % cream       insulin degludec 200 unit/mL (3 mL) InPn Inject 0.4 mL (80 Units total) under the skin daily. Titrate up to 100units daily as instructed. (Patient taking differently: Inject 0.325 mL (65 Units total) under the skin daily. Titrate up to 100units daily as instructed.) 45 mL 3    insulin glargine U-300 conc (TOUJEO SOLOSTAR) 300 unit/mL (1.5 mL) injection pen Inject 70 Units under the skin daily. 24 mL 3    ketoconazole (NIZORAL) 2 % cream       losartan (COZAAR) 25 MG tablet TAKE 1 TABLET BY MOUTH DAILY. 90 tablet 3    magnesium chloride (SLOW_MAG) 64 mg TbEC Take 128 mg by mouth three (3) times a day (at 6am, noon and 6pm). (2 tablets) 9am 1 pm and 9 pm      metoPROLOL succinate (TOPROL-XL) 100 MG 24 hr tablet TAKE ONE TABLET TWICE DAILY 180 tablet 1    miscellaneous medical supply Misc 1 application by Miscellaneous route daily as needed. Lightweight wheelchair. 1  each 0    miscellaneous medical supply Misc 1 application by Miscellaneous route nightly. Recommend CPAP 14 cm H2O with EPR 3 and heated humidifier for nasal dryness, mask: ResMed Activa Lt nasal mask (Large). 1 each 0    multivitamin capsule Take 1 capsule by mouth daily.      MYFORTIC 180 mg EC tablet Take 3 tablets (540 mg total) by mouth two (2) times a day. 540 tablet 3    PEN NEEDLE 32 gauge x 5/32 (4 mm) Ndle USE 3 TIMES DAILY BEFORE MEALS 300 each 3    pravastatin (PRAVACHOL) 40 MG tablet Take 1 tablet (40 mg total) by mouth daily. 90 tablet 3    semaglutide (OZEMPIC) 0.25 mg or 0.5 mg (2 mg/3 mL) PnIj Inject 0.5 mg under the skin once a week. 9 mL 3    tacrolimus (PROGRAF) 1 MG capsule Take 3 capsules (3 mg total) by mouth in the morning AND 2 capsules (2 mg total) in the evening. 450 capsule 3    warfarin (JANTOVEN) 5 MG tablet TAKE 1 TABLET BY MOUTH DAILY WITH EVENING MEAL. TAKE 6 DAYS A WEEK (TUESDAY THROUGH SUNDAY) AS DIRECTED. 90 tablet 3    aspirin (ECOTRIN) 81 MG tablet Take 1 tablet (81 mg total) by mouth daily as needed (when flying). (Patient not taking: Reported on 04/16/2022)      dulaglutide (TRULICITY) 1.5 mg/0.5 mL PnIj Inject 0.5 mL (1.5 mg total) under the skin every seven (7) days. 2 mL 11    dulaglutide (TRULICITY) 1.5 mg/0.5 mL PnIj Inject 0.5 mL (1.5 mg total) under the skin once a week. (Patient not taking: Reported on 08/29/2022) 2 mL 11    empagliflozin (JARDIANCE) 10 mg tablet Take 1 tablet (10 mg total) by mouth daily.      LOKELMA 10 gram PwPk packet DOSE: 1 PACKET (10 grams) every other day.  Empty entire contents of ONE packet into a glass with 45 mL of water. Stir well and drink immediately; if powder remains in the glass, add water, stir and drink immediately; repeat until no powder remains. (Patient not taking: Reported on 08/29/2022) 11 packet 0     No current facility-administered medications for this visit.     Facility-Administered Medications Ordered in Other Visits   Medication Dose Route Frequency Provider Last Rate Last Admin    bevacizumab    PRN (once a day) Melvyn Novas, MD   2.5 mg at 01/28/17 0750       Physical Exam    BP 96/68 Comment: manual bp - Temp 36.3 ??C (97.3 ??F) (Temporal)  - Ht 185.4 cm (6' 1)  - Wt (!) 107.8 kg (237 lb 9.6 oz)  - BMI 31.35 kg/m??   General: Patient is a pleasant male in no apparent distress.  Eyes: Sclera anicteric.  Neck: Supple without LAD/JVD/bruits.  Lungs: Clear to auscultation bilaterally, no wheezes/rales/rhonchi.  Cardiovascular: Regular rate and rhythm without murmurs, rubs or gallops.  Abdomen: Soft, notender/nondistended. Positive bowel sounds. No hepatosplenomegaly, masses or bruits appreciated.  Extremities:  1+ LE edema  Skin: Without rash  Neurological:  gait unsteady, distal sensory loss in LEs .  Psychiatric: Mood and affect appropriate.    Laboratory Results

## 2022-08-29 NOTE — Unmapped (Signed)
Saw patient in clinic    Was on trulicity and had to switch to ozempic. Since then has had stomach pain for a few days after injection. Has had 2 injections. +reflux    Drinking good    Decreased appetite    BS, wears libre. AM 70-95    BP in clinic 96/68 not checking at home but when he checks 120/80's    +feels flushed sometimes when he first stands up    +DOE    +cold hands/ neuropathy    +balance issues    Denies dizziness, HA, CP, heart palpitations, swelling, urinary problems, fevers, or tremors    Labs today and took tac at 10pm

## 2022-08-30 LAB — CMV DNA, QUANTITATIVE, PCR: CMV VIRAL LD: NOT DETECTED

## 2022-08-30 LAB — BK VIRUS QUANTITATIVE PCR, BLOOD: BK BLOOD RESULT: NOT DETECTED

## 2022-08-30 LAB — TACROLIMUS LEVEL: TACROLIMUS BLOOD: 3.5 ng/mL

## 2022-09-03 LAB — HLA DS POST TRANSPLANT
ANTI-DONOR DRW #2 MFI: 7 MFI
ANTI-DONOR HLA-A #1 MFI: 0 MFI
ANTI-DONOR HLA-B #1 MFI: 8 MFI
ANTI-DONOR HLA-B #2 MFI: 39 MFI
ANTI-DONOR HLA-C #1 MFI: 0 MFI
ANTI-DONOR HLA-C #2 MFI: 3 MFI
ANTI-DONOR HLA-DQB #1 MFI: 15 MFI
ANTI-DONOR HLA-DQB #2 MFI: 71 MFI
ANTI-DONOR HLA-DR #1 MFI: 157 MFI
ANTI-DONOR HLA-DR #2 MFI: 623 MFI

## 2022-09-03 LAB — FSAB CLASS 1 ANTIBODY SPECIFICITY: HLA CLASS 1 ANTIBODY RESULT: NEGATIVE

## 2022-09-03 LAB — FSAB CLASS 2 ANTIBODY SPECIFICITY: HLA CL2 AB RESULT: NEGATIVE

## 2022-09-17 ENCOUNTER — Ambulatory Visit: Admit: 2022-09-17 | Discharge: 2022-09-17 | Payer: MEDICARE | Attending: Vascular Surgery | Primary: Vascular Surgery

## 2022-09-17 ENCOUNTER — Ambulatory Visit: Admit: 2022-09-17 | Discharge: 2022-09-17 | Payer: MEDICARE

## 2022-09-17 DIAGNOSIS — I739 Peripheral vascular disease, unspecified: Principal | ICD-10-CM

## 2022-09-17 DIAGNOSIS — L97421 Non-pressure chronic ulcer of left heel and midfoot limited to breakdown of skin: Principal | ICD-10-CM

## 2022-09-17 DIAGNOSIS — E1129 Type 2 diabetes mellitus with other diabetic kidney complication: Principal | ICD-10-CM

## 2022-09-17 MED ORDER — TRULICITY 1.5 MG/0.5 ML SUBCUTANEOUS PEN INJECTOR
11 refills | 0 days
Start: 2022-09-17 — End: ?

## 2022-09-17 NOTE — Unmapped (Unsigned)
[place] [facility]    HPI:  David Ellis is a 77 y.o. male with past medical history of arthritis, atrial flutter, DM, heart disease, HLD, HTN, osteomyelitis, PE (2015), squamous cell skin cancer, stroke, and a transplanted kidney (2013), who presents today for evaluation of right lower extremity coldness and a h/o wounds, concerning for PAD.    Today, [name] reports ***.      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    PR AMPUTATION METATARSAL+TOE,SINGLE Left 07/06/2014    Procedure: AMPUTATION, METATARSAL, WITH TOE SINGLE;  Surgeon: Marion Downer, MD;  Location: MAIN OR [facility];  Service: Vascular    PR AMPUTATION METATARSAL+TOE,SINGLE Left 10/28/2014    Procedure: AMPUTATION, METATARSAL, WITH TOE SINGLE;  Surgeon: Maple Mirza, MD;  Location: MAIN OR [place];  Service: Vascular    PR COLONOSCOPY FLX DX W/COLLJ SPEC WHEN PFRMD N/A 02/27/2022    Procedure: COLONOSCOPY, FLEXIBLE, PROXIMAL TO SPLENIC FLEXURE; DIAGNOSTIC, W/WO COLLECTION SPECIMEN BY BRUSH OR WASH;  Surgeon: Luanne Bras, MD;  Location: [facility];  Service: Gastroenterology    PR COLONOSCOPY W/BIOPSY SINGLE/MULTIPLE  02/27/2022    Procedure: COLONOSCOPY, FLEXIBLE, PROXIMAL TO SPLENIC FLEXURE; WITH BIOPSY, SINGLE OR MULTIPLE;  Surgeon: Luanne Bras, MD;  Location: [facility];  Service: Gastroenterology    PR COLSC FLX W/RMVL OF TUMOR POLYP LESION SNARE TQ Left 02/27/2022    Procedure: COLONOSCOPY FLEX; W/REMOV TUMOR/LES BY SNARE;  Surgeon: Luanne Bras, MD;  Location: [facility];  Service: Gastroenterology    PR VITRECTOMY,PANRETINAL LASER RX Right 11/26/2016    Procedure: VITRECTOMY, MECHANICAL, PARS PLANA APPROACH; WITH ENDOLASER PANRETINAL PHOTOCOAGULATION;  Surgeon: Melvyn Novas, MD;  Location: [facility] OR [facility];  Service: Ophthalmology    PR Hessie Knows LASER RX Left 01/28/2017    Procedure: VITRECTOMY, MECHANICAL, PARS PLANA APPROACH; WITH ENDOLASER PANRETINAL PHOTOCOAGULATION;  Surgeon: Melvyn Novas, MD;  Location: [facility] OR [facility];  Service: Ophthalmology    SKIN BIOPSY      VASECTOMY         Family History   Problem Relation Age of Onset    Kidney disease Mother     Diabetes Mother     Hypertension Mother     Kidney disease Father     Cancer Father  Ulcers Father     Kidney disease Maternal Grandmother     Diabetes Maternal Grandmother     Glaucoma Neg Hx     Macular degeneration Neg Hx     Strabismus Neg Hx        Social History     Socioeconomic History    Marital status: Married   Tobacco Use    Smoking status: Never     Passive exposure: Never    Smokeless tobacco: Never   Vaping Use    Vaping status: Never Used   Substance and Sexual Activity    Alcohol use: No    Drug use: No   Other Topics Concern    Do you use sunscreen? Yes     Comment: long sleeves & floppy hat    Tanning bed use? No    Excessive sun exposure? No    Blistering sunburns? No   Social History Narrative    Works for the city of [employer].    Retired from the [employer].      Married.      Two healthy children.       Social Determinants of Health     Financial Resource Strain: Low Risk  (07/19/2020)    Overall Financial Resource Strain (CARDIA)     Difficulty of Paying Living Expenses: Not very hard   Food Insecurity: No Food Insecurity (07/19/2020)    Hunger Vital Sign     Worried About Running Out of Food in the Last Year: Never true     Ran Out of Food in the Last Year: Never true   Transportation Needs: No Transportation Needs (07/19/2020)    PRAPARE - Therapist, art (Medical): No     Lack of Transportation (Non-Medical): No   Physical Activity: Sufficiently Active (07/19/2020)    Exercise Vital Sign     Days of Exercise per Week: 5 days     Minutes of Exercise per Session: 30 min   Stress: No Stress Concern Present (07/19/2020)    [employer] of Occupational Health - Occupational Stress Questionnaire     Feeling of Stress : Only a little   Social Connections: Moderately Integrated (07/19/2020)    Social Connection and Isolation Panel [NHANES]     Frequency of Communication with Friends and Family: More than three times a week     Frequency of Social Gatherings with Friends and Family: Once a week     Attends Religious Services: 1 to 4 times per year     Active Member of [employer] or Organizations: No     Attends Banker Meetings: Never     Marital Status: Married       Current Outpatient Medications   Medication Instructions    acetaminophen (TYLENOL) 325 MG tablet Oral    aspirin (ECOTRIN) 81 mg, Daily PRN    blood-glucose meter kit Use as instructed.    cholecalciferol (vitamin D3-50 mcg (2,000 unit)) 2,000 Units, Oral, Daily (standard)    empagliflozin (JARDIANCE) 10 mg, Oral, Every morning    empagliflozin (JARDIANCE) 10 mg, Oral, Daily (standard)    flash glucose sensor (FREESTYLE LIBRE 14 DAY SENSOR) kit Other, Every 14 days    furosemide (LASIX) 40 mg, Oral, Daily (standard), 40 mg daily. And 1 additional tablet for edema.    gabapentin (NEURONTIN) 300 MG capsule TAKE 1 CAPSULE BY MOUTH TWICE DAILY AS DIRECTED    HUMALOG KWIKPEN INSULIN 100 unit/mL injection pen INJECT 15-25  UNITS WITH MEALS + CORRECTION **MAX 100 UNITS A DAY**    hydrocortisone 2.5 % cream     insulin degludec 80 Units, Subcutaneous, Daily (standard), Titrate up to 100units daily as instructed.    insulin glargine U-300 conc (TOUJEO SOLOSTAR) 70 Units, Subcutaneous, Daily (standard)    ketoconazole (NIZORAL) 2 % cream     LOKELMA 10 gram PwPk packet DOSE: 1 PACKET (10 grams) every other day.  Empty entire contents of ONE packet into a glass with 45 mL of water. Stir well and drink immediately; if powder remains in the glass, add water, stir and drink immediately; repeat until no powder remains.    losartan (COZAAR) 25 mg, Oral, Daily (standard)    magnesium chloride (SLOW-MAG) 128 mg, Oral, 3 times a day, (2 tablets) 9am 1 pm and 9 pm    metoPROLOL succinate (TOPROL-XL) 100 MG 24 hr tablet TAKE ONE TABLET TWICE DAILY    miscellaneous medical supply Misc 1 application., Miscellaneous, Daily PRN, Lightweight wheelchair.    miscellaneous medical supply Misc 1 application., Miscellaneous, Nightly, Recommend CPAP 14 cm H2O with EPR 3 and heated humidifier for nasal dryness, mask: ResMed Activa Lt nasal mask (Large).    multivitamin capsule 1 capsule, Oral, Daily (standard)    MYFORTIC 540 mg, Oral, 2 times a day    OZEMPIC 0.5 mg, Subcutaneous, Weekly    PEN NEEDLE 32 gauge x 5/32 (4 mm) Ndle USE 3 TIMES DAILY BEFORE MEALS    pravastatin (PRAVACHOL) 40 mg, Oral, Daily (standard)    tacrolimus (PROGRAF) 1 MG capsule Take 3 capsules (3 mg total) by mouth in the morning AND 2 capsules (2 mg total) in the evening.    TRULICITY 1.5 mg, Subcutaneous, Every 7 days    TRULICITY 1.5 mg, Subcutaneous, Weekly    warfarin (JANTOVEN) 5 MG tablet TAKE 1 TABLET BY MOUTH DAILY WITH EVENING MEAL. TAKE 6 DAYS A WEEK (TUESDAY THROUGH SUNDAY) AS DIRECTED.       Allergies   Allergen Reactions    Penicillin G Other (See Comments)    Egg Derived Rash    Enalapril Cough    Epinephrine Palpitations    Grass Pollen-[place], Standard Itching    Levofloxacin Other (See Comments)     Per patient    Lisinopril Rash    Mepivacaine Hcl Palpitations    Penicillins Other (See Comments)     As child  Tolerated amp/sulbactam without side effects during 10/2014 hospitalization       PE:  There were no vitals filed for this visit.  General: 77 y.o. malein NAD  Psych : A,A Ox 3  Neuro: Grossly Intact  Neck: Supple, no evidence of JVD , no evidence of thyromegaly  Chest : CTAB  Heart: RRR, No M/G/R  Abdomen: soft, NT,ND BS+  Upper extremities : warm and well perfused. 2+ radial pulses to BUE.   Lower extremities: warm and well perfused. Palpable DP/PT pulses.     ASSESSMENT:  77 y.o. male who presents for evaluation of right lower extremity coldness and a h/o wounds, concerning for PAD. Patient presents today with reports of ***. Imaging today demonstrates ***.     PLAN:  - ***  - RTC in ***. Return precautions advised.     DIAGNOSTICS:   BLE Arterial Duplex (09/17/2022):

## 2022-09-20 NOTE — Unmapped (Signed)
[facility] Refill Coordination Note    Specialty Medication(s) to be Shipped:   Transplant: Myfortic 180 mg and tacrolimus 1mg     Other medication(s) to be shipped: No additional medications requested for fill at this time     David Ellis, DOB: [birth year]  Phone: [phone] (home)       All above HIPAA information was verified with patient.     Was a Nurse, learning disability used for this call? No    Completed refill call assessment today to schedule patient's medication shipment from the [facility] [phone]).  All relevant notes have been reviewed.     Specialty medication(s) and dose(s) confirmed: Regimen is correct and unchanged.   Changes to medications: Phuc reports no changes at this time.  Changes to insurance: No  New side effects reported not previously addressed with a pharmacist or physician: None reported  Questions for the pharmacist: No    Confirmed patient received a Conservation officer, historic buildings and a Surveyor, mining with first shipment. The patient will receive a drug information handout for each medication shipped and additional FDA Medication Guides as required.       DISEASE/MEDICATION-SPECIFIC INFORMATION        N/A    SPECIALTY MEDICATION ADHERENCE     Medication Adherence    Patient reported X missed doses in the last month: 0  Specialty Medication: MYFORTIC 180 mg EC tablet (mycophenolate)  Patient is on additional specialty medications: Yes  Additional Specialty Medications: tacrolimus 1 MG capsule (PROGRAF)  Patient Reported Additional Medication X Missed Doses in the Last Month: 0  Patient is on more than two specialty medications: No  Adherence tools used: patient uses a pill box to manage medications  Support network for adherence: family member              Were doses missed due to medication being on hold? No    Myfortic 180 mg: 4 days of medicine on hand   tacrolimus 1 mg: 4 days of medicine on hand       REFERRAL TO PHARMACIST     Referral to the pharmacist: Not needed      [facility] [address]     Shipping address confirmed in Epic.       Delivery Scheduled: Yes, Expected medication delivery date: 09/24/22.     Medication will be delivered via Same Day Courier to the prescription address in Epic WAM.    Quintella Reichert   [facility] Specialty Technician

## 2022-09-21 MED ORDER — TRULICITY 1.5 MG/0.5 ML SUBCUTANEOUS PEN INJECTOR
11 refills | 0 days | Status: CP
Start: 2022-09-21 — End: ?

## 2022-09-24 MED FILL — MYFORTIC 180 MG TABLET,DELAYED RELEASE: ORAL | 30 days supply | Qty: 180 | Fill #11

## 2022-09-24 MED FILL — TACROLIMUS 1 MG CAPSULE, IMMEDIATE-RELEASE: ORAL | 30 days supply | Qty: 150 | Fill #6

## 2022-09-26 ENCOUNTER — Ambulatory Visit: Admit: 2022-09-26 | Discharge: 2022-09-27 | Payer: MEDICARE | Attending: "Endocrinology | Primary: "Endocrinology

## 2022-09-26 DIAGNOSIS — I739 Peripheral vascular disease, unspecified: Principal | ICD-10-CM

## 2022-09-26 DIAGNOSIS — Z794 Long term (current) use of insulin: Principal | ICD-10-CM

## 2022-09-26 DIAGNOSIS — E1142 Type 2 diabetes mellitus with diabetic polyneuropathy: Principal | ICD-10-CM

## 2022-09-26 DIAGNOSIS — S98139A Complete traumatic amputation of one unspecified lesser toe, initial encounter: Principal | ICD-10-CM

## 2022-09-26 DIAGNOSIS — E1129 Type 2 diabetes mellitus with other diabetic kidney complication: Principal | ICD-10-CM

## 2022-09-26 NOTE — Unmapped (Addendum)
With reduced appetite and reduced intake, you are having less hyperglycemia, but also more hypoglycemia.   We need to reduce insulin to reflect this change.     Reduce once daily long acting insulin to 60units intially.   If you continue to have overnight glucoses < 90, reduce another 5units at a time.     Reduce Humalog at meal times:   100-200 10 units  200-300 15 units    I would not restart Trulicity nor ozempic with low appetite and early satiety.     I will complete [employer] paperwork and  fax directly.

## 2022-09-26 NOTE — Unmapped (Signed)
Meter downloaded. POC glucose done today. PP 9:30 am. 97 mg/dL.

## 2022-09-26 NOTE — Unmapped (Signed)
LV 03/2022:      Trulicity does not need PA b/c not a new start. Continue.  Appears that glargine is preferred over [place].  I have sent in substitute.   Confirmed I signed parachute request to continue Freestyle LIbre.   I am also sending updated script for new year for Humalog.      ASSESSMENT / PLAN  1. 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)  2. Encounter for long-term (current) use of insulin (CMS-HCC)  Keep up the great work with estimated HgbA1c 7.4 based on CGM readings and only 1% hypoglycemia while on mdi of insulin, GLP1a and SGLT2i.    If you are having fasting glucoses < 100, please reduce tresiba by 5 units at a time [initially 65units for a week].  If glucoses again trend up to > 120, you can increase a few units at a time.   It can take 3-4 days for [place] to reach steady state.      Will need to continue to focus on prandial coverage to reduce hyperglycemia.  - TSH; Future    3. Dizzy  4. History of kidney transplant  Monitored closly by Tplant/Nephrology on SGLT2i w proteinuria.  In setting of dizzyness with standing and while standing (and recent mildly elevated K, and SBP < 100) split jardiance and only take 5mg  daily.   Review with Nephrology and PCP in case they want to adjust either losartan or metoprolol as well.    5.  Diabetic polyneuropathy associated with type 2 diabetes mellitus (CMS-HCC)  Foot exam today intact. Qualifies for DM shoes w hx of amputation/foot deformity.    MEDICARE:  *The patient is currently using the therapeutic CGM as prescribed.  *The patient has diabetes.  *The patient is insulin-treated with 3 or more daily administrations of insulin (MDII)  prior to use of a CGM.   *The patient???s insulin treatment regimen requires frequent adjustments by the patient based on sliding scale.  *Within the last six months, I had an in-person visit with the beneficiary to evaluate their diabetes control and determine that the above criteria are met. *Every six months I will continue to have an in-person visit with the beneficiary to assess adherence to their CGM regimen and diabetes treatment plan.      No follow-ups on file.      SUBJECTIVE  PCP: [name]  Tplant: Amy Mottle  Optho:  Herma Ard  Wound: Keagy  Podiatry: Dr. Ether Griffins, [facility]   Derm: Davy Pique, [facility]    HPI: David Ellis is a 77 y.o. male who I last saw 10/2021 returns for follow up of T2DM with hx of kidney transplant.    Here w wife [name].    At last visit,we reduced [place] dose and now taking:  [place] 70units.  Humalog max up to 25u TIDAC [if <100, does not take, if 100-150 takes 10units, if 150-200 takes 15units,etc]  Trulicity 1.5mg  weekly.    In 12/2021, due to rise in urine protein, nephrology also added empagliflozin 10mg  daily.   Subsequent Nl K in 01/2022, and elevated K 5.4 in 9/20233.  GFR stable to improved.  FU w Nephrology 06/2021.     CGM downloaded and interpreted x 72 hours or more. Daily trends reviewed including patterns for 56% target range, only 1% hypoglycemia, GMI 7.4%.  Noted post prandial rises, followed by downward trend overnight.             Appetite OK.  10 lb loss since 08/2021. Largely stable at 240 lbs.  Feels pretty good.  Has episodes of transient pressure/pain behind eyes. No associated fever/sinus congestion.  Can wake shivering chills in the AM.  After putting on coat/cup of coffee resolves. Not during day.   Also in past several months transient dizzy spells when standing or with standing. Normal glucose.  Hx of syncope in shower from excess BP control several years ago.   This is on top of poor balance -- has not been able to golf x 3 yrs.  Reduced vision. With stable optho exam.  Goes to PT twice a week and can now straighten L arm. Has not had PT to focus on balance.     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 Maintains  ARB and SGLT2i.  Peripheral neuropathy w hx of amputation. On Neurontin.  Foot exam today.  BP tightly controlled. Maintained on BB and ARB. Prn lasix for edema  Lipids on low dose pravastatin 20.       Endocrine Obtained / Updated Problem List:   1. T2DM dx 1992, insulin roughly 2000:   Proliferative DR s/p hx vitreous hemorrhage and endolaser therapy..  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 No monofilament 07/2021  Hypoglycemic emergency requiring EMS 03/2011.   Historically, Victoza, Ozempic, Trulicity were not covered or too $$.   Offered to check cpeptide to see if he would qualify for insulin pump under medcare, though he is not interested.    Off MTF since end of 2015/early 2016.   2. 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   3. HTN   4. Nephrolithiasis   5. OSA on cpap   6. Aflutter s/p ablation 06/2011, atrial fibrillation  7. PE    Patient Active Problem List   Diagnosis    Type II diabetes mellitus with renal manifestations, uncontrolled    Essential (primary) hypertension    History of kidney transplant    Mixed hyperlipidemia    Retinopathy due to secondary diabetes (CMS-HCC)    Transplanted kidney    Other pulmonary embolism without acute cor pulmonale (CMS-HCC)    Edema    Right ventricular dysfunction    Atrial fibrillation (CMS-HCC)    Aftercare following organ transplant    Diverticulosis of colon    Routine general medical examination at a health care facility    OSA on CPAP    Diabetic polyneuropathy associated with type 2 diabetes mellitus (CMS-HCC)    Diabetic ulcer of left foot associated with type 2 diabetes mellitus (CMS-HCC)    Toe amputation status    Left knee pain    BCC (basal cell carcinoma), face    Acquired absence of other toe(s), unspecified side (CMS-HCC)    History of colonic polyps    Type 2 diabetes mellitus with foot ulcer (CODE) (CMS-HCC)    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)    Hypoglycemia associated with type 2 diabetes mellitus (CMS-HCC)    Class 2 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    Immunosuppressed status (CMS-HCC)    Syncope    Non-healing open wound of toe    At high risk for falls    Encounter for screening colonoscopy    Arthralgia of right elbow    Pain in joint of right shoulder    Pain  of right hip joint     Past Medical History:   Diagnosis Date    Arthritis     Atrial flutter (CMS-HCC)     Cataract     Diabetes mellitus (CMS-HCC)     Diabetic nephropathy (CMS-HCC)     Diabetic retinopathy (CMS-HCC)     Fractures     Ganglion cyst     Hand injury     Heart disease     Hyperlipidemia     Hypertension     Impaired mobility     Joint pain     Lack of access to transportation     Osteomyelitis (CMS-HCC) 11/2014    Pulmonary embolism (CMS-HCC) 05/2014    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      aspirin (ECOTRIN) 81 MG tablet Take 1 tablet (81 mg total) by mouth daily as needed (when flying).      blood-glucose meter kit Use as instructed. 1 each 0    cholecalciferol, vitamin D3-50 mcg, 2,000 unit,, 50 mcg (2,000 unit) tablet Take 1 tablet (50 mcg total) by mouth daily.      dulaglutide (TRULICITY) 1.5 mg/0.5 mL PnIj Inject 0.5 mL (1.5 mg total) under the skin every seven (7) days. 2 mL 11    dulaglutide (TRULICITY) 1.5 mg/0.5 mL PnIj Inject 0.5 mL (1.5 mg total) under the skin once a week. 2 mL 11    empagliflozin (JARDIANCE) 10 mg tablet Take 1 tablet (10 mg total) by mouth every morning. (Patient taking differently: Take 0.5 tablets (5 mg total) by mouth every morning.) 90 tablet 3    empagliflozin (JARDIANCE) 10 mg tablet Take 1 tablet (10 mg total) by mouth daily.      flash glucose sensor (FREESTYLE LIBRE 14 DAY SENSOR) kit by Other route every fourteen (14) days. 2 each 11    furosemide (LASIX) 40 MG tablet Take 1 tablet (40 mg total) by mouth daily. 40 mg daily. And 1 additional tablet for edema.      gabapentin (NEURONTIN) 300 MG capsule TAKE 1 CAPSULE BY MOUTH TWICE DAILY AS DIRECTED 180 capsule 3    HUMALOG KWIKPEN INSULIN 100 unit/mL injection pen INJECT 15-25 UNITS WITH MEALS + CORRECTION **MAX 100 UNITS A DAY** 60 mL 3    hydrocortisone 2.5 % cream       insulin degludec 200 unit/mL (3 mL) InPn Inject 0.4 mL (80 Units total) under the skin daily. Titrate up to 100units daily as instructed. (Patient taking differently: Inject 0.325 mL (65 Units total) under the skin daily. Titrate up to 100units daily as instructed.) 45 mL 3    insulin glargine U-300 conc (TOUJEO SOLOSTAR) 300 unit/mL (1.5 mL) injection pen Inject 70 Units under the skin daily. 24 mL 3    ketoconazole (NIZORAL) 2 % cream       LOKELMA 10 gram PwPk packet DOSE: 1 PACKET (10 grams) every other day.  Empty entire contents of ONE packet into a glass with 45 mL of water. Stir well and drink immediately; if powder remains in the glass, add water, stir and drink immediately; repeat until no powder remains. (Patient taking differently: DOSE:  1 PACKET (10 grams) every other day.  Empty entire contents of ONE packet into a glass with 45 mL of water. Stir well and drink immediately; if powder remains in the glass, add water, stir and drink immediately; repeat until no powder remains.) 11 packet 0    losartan (COZAAR) 25 MG tablet TAKE 1 TABLET BY MOUTH DAILY. 90 tablet 3    magnesium chloride (SLOW_MAG) 64 mg TbEC Take 128 mg by mouth three (3) times a day (at 6am, noon and 6pm). (2 tablets) 9am 1 pm and 9 pm      metoPROLOL succinate (TOPROL-XL) 100 MG 24 hr tablet TAKE ONE TABLET TWICE DAILY 180 tablet 1    miscellaneous medical supply Misc 1 application by Miscellaneous route daily as needed. Lightweight wheelchair. 1 each 0    miscellaneous medical supply Misc 1 application by Miscellaneous route nightly. Recommend CPAP 14 cm H2O with EPR 3 and heated humidifier for nasal dryness, mask: ResMed Activa Lt nasal mask (Large). 1 each 0    multivitamin capsule Take 1 capsule by mouth daily.      MYFORTIC 180 mg EC tablet Take 3 tablets (540 mg total) by mouth two (2) times a day. 540 tablet 3    PEN NEEDLE 32 gauge x 5/32 (4 mm) Ndle USE 3 TIMES DAILY BEFORE MEALS 300 each 3    pravastatin (PRAVACHOL) 40 MG tablet Take 1 tablet (40 mg total) by mouth daily. 90 tablet 3    semaglutide (OZEMPIC) 0.25 mg or 0.5 mg (2 mg/3 mL) PnIj Inject 0.5 mg under the skin once a week. 9 mL 3    tacrolimus (PROGRAF) 1 MG capsule Take 3 capsules (3 mg total) by mouth in the morning AND 2 capsules (2 mg total) in the evening. 450 capsule 3    TRULICITY 1.5 mg/0.5 mL PnIj INJECT 0.5ML (1.5MG  TOTAL) UNDER THE SKIN EVERY 7 DAYS 2 mL 11    warfarin (JANTOVEN) 5 MG tablet TAKE 1 TABLET BY MOUTH DAILY WITH EVENING MEAL. TAKE 6 DAYS A WEEK (TUESDAY THROUGH SUNDAY) AS DIRECTED. 90 tablet 3     No current facility-administered medications for this visit.     Facility-Administered Medications Ordered in Other Visits   Medication Dose Route Frequency Provider Last Rate Last Admin    bevacizumab    PRN (once a day) Melvyn Novas, MD   2.5 mg at 01/28/17 1610       Social History: reviewed: his wife was kidney donor. rare etoh. no cig. 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     Family History: 47yo son has uncertain clotting disorder.    Other past medical history, medications, allergies and problem list are reviewed in the medical record    REVIEW OF SYSTEMS: Pertinent positives and negatives as in HPI, otherwise, all other systems reviewed are negative.     OBJECTIVE   Vitals:    09/26/22 1310   BP: 122/66   Pulse: 70   Temp: 35.8 ??C (96.5 ??F)   Weight: (!) 107.3 kg (236 lb 9.6 oz)     Body mass index is 31.22 kg/m??.    BP Readings from Last 3 Encounters:   09/26/22 122/66   09/17/22 135/69   08/29/22 96/68     Wt Readings from Last 3 Encounters:   09/26/22 (!) 107.3 kg (236 lb 9.6 oz)   09/17/22 (!) 107 kg (236 lb)   08/29/22 (!) 107.8 kg (237 lb 9.6 oz)     PSYCH: calm, cooperative with  full affect  EYES: eomi, sclera anicteric  LE:  No pitting edema  No DP nor PT detected  Skin intact, but flaking  S/p amputation of left lateral toes with incision intact    Pertinent labs:  Office Visit on 09/26/2022   Component Date Value Ref Range Status    Glucose, POC 09/26/2022 97  70 - 179 mg/dL Final   Appointment on 08/29/2022   Component Date Value Ref Range Status    Color, UA 08/29/2022 Yellow   Final    Clarity, UA 08/29/2022 Clear   Final    Specific Gravity, UA 08/29/2022 1.021  1.003 - 1.030 Final    pH, UA 08/29/2022 5.5  5.0 - 9.0 Final    Leukocyte Esterase, UA 08/29/2022 Negative  Negative Final    Nitrite, UA 08/29/2022 Negative  Negative Final    Protein, UA 08/29/2022 30 mg/dL (A)  Negative Final    Glucose, UA 08/29/2022 >1000 mg/dL (A)  Negative Final    Ketones, UA 08/29/2022 Negative  Negative Final    Urobilinogen, UA 08/29/2022 <2.0 mg/dL  <2.9 mg/dL Final    Bilirubin, UA 08/29/2022 Negative  Negative Final    Blood, UA 08/29/2022 Negative  Negative Final    RBC, UA 08/29/2022 2  <=3 /HPF Final    WBC, UA 08/29/2022 1  <=2 /HPF Final    Squam Epithel, UA 08/29/2022 <1  0 - 5 /HPF Final    Bacteria, UA 08/29/2022 None Seen  None Seen /HPF Final    Mucus, UA 08/29/2022 Rare (A)  None Seen /HPF Final    Creat U 08/29/2022 112.5  Undefined mg/dL Final    Protein, Ur 56/21/3086 37.1  Undefined mg/dL Final    Protein/Creatinine Ratio, Urine 08/29/2022 0.330  Undefined Final    Urine Culture, Comprehensive 08/29/2022 NO GROWTH   Final   Appointment on 08/29/2022   Component Date Value Ref Range Status    Hemoglobin A1C 08/29/2022 7.1 (H)  4.8 - 5.6 % Final    Estimated Average Glucose 08/29/2022 157  mg/dL Final    Donor ID [id number]   Final    Donor HLA-A Antigen #1 08/29/2022 A31   Final    Anti-Donor HLA-A #1 MFI 08/29/2022 0  <1000 MFI Final    Donor HLA-B Antigen #1 08/29/2022 B39   Final    Anti-Donor HLA-B #1 MFI 08/29/2022 8  <1000 MFI Final    Donor HLA-B Antigen #2 08/29/2022 B62   Final    Anti-Donor HLA-B #2 MFI 08/29/2022 39  <1000 MFI Final    Donor HLA-C Antigen #1 08/29/2022 C4   Final    Anti-Donor HLA-C #1 MFI 08/29/2022 0  <1000 MFI Final    Donor HLA-C Antigen #2 08/29/2022 C12   Final    Anti-Donor HLA-C #2 MFI 08/29/2022 3  <1000 MFI Final    Donor HLA-DR Antigen #1 08/29/2022 DR1   Final    Anti-Donor HLA-DR #1 MFI 08/29/2022 157  <1000 MFI Final    Donor HLA-DR Antigen #2 08/29/2022 DR4   Final    Anti-Donor HLA-DR #2 MFI 08/29/2022 623  <1000 MFI Final    Donor DRw Antigen #2 08/29/2022 DR53   Final    Anti-Donor DRw #2 MFI 08/29/2022 7  <1000 MFI Final    Donor HLA-DQB Antigen #1 08/29/2022 DQ5   Final    Anti-Donor HLA-DQB #1 MFI 08/29/2022 15  <1000 MFI Final    Donor HLA-DQB Antigen #2 08/29/2022 DQ7  Final    Anti-Donor HLA-DQB #2 MFI 08/29/2022 71  <1000 MFI Final    DSA Comment 08/29/2022    Final    Comment: The HLA antigens listed are donor antigens and the corresponding MFI value.  MFI values >= 1000 are considered positive.  A negative result does not exclude the presence of low level DSA.  Blank donor antigen and MFI fields indicate unavailable donor HLA   type or lack of appropriate single antigen bead to determine DSA.  As such, the presence or absence of DSA to the locus cannot be confirmed.     Donor specific antibody (DSA) testing is performed with a  solid phase multiplex single antigen bead array method.  All procedures and reagents have been validated and performance characteristics determined by the [facility].    Certain of these tests have not been cleared/approved by the U.S. Food and Drug Administration (FDA).  The FDA has determined that such approval/clearance is not necessary because this laboratory is certified under the Clinical Laboratory Improvements   Amendments to perform high complexity testing.  This test is used for clinical purposes.  It                            should not be regarded as investigational or for research.  All HLA typings are performed using molecular methodologies.  HLA-A, B, C, DR, and DQ results are   serological equivalents of the molecular type.        HLA Class 2 Antibody Result 08/29/2022 Negative   Final    HLA Class 2 Antibody Comment 08/29/2022    Final    All procedures and reagents have been validated and performance characteristics determined by the [facility]. Certain of these tests have not been cleared/approved by the U.S. Food and Drug Administration (FDA). The FDA has determined   that such approval/clearance is not necessary because this laboratory is certified under the Clinical Laboratory Improvement Amendments to perform high complexity testing. This test is used for clinical purposes. It should not be regarded as   investigational or for research. All HLA typings performed using molecular methodologies. HLA-A, B, C, DR, and DQ results are serological equivalents of the molecular type.  MFI values => 1000 are considered positive.  HLA Class I antibody testing is performed with a  solid phase multiplex single antigen bead array method.  A negative result does not exclude the presence of low level antibody.    HLA Class 1 Antibody Result 08/29/2022 Negative   Final    HLA Class 1 Antibody Comment 08/29/2022    Final    All procedures and reagents have been validated and performance characteristics determined by the [facility]. Certain of these tests have not been cleared/approved by the U.S. Food and Drug Administration (FDA). The FDA has determined   that such approval/clearance is not necessary because this laboratory is certified under the Clinical Laboratory Improvement Amendments to perform high complexity testing. This test is used for clinical purposes. It should not be regarded as   investigational or for research. All HLA typings performed using molecular methodologies. HLA-A, B, C, DR, and DQ results are serological equivalents of the molecular type.  MFI values => 1000 are considered positive.  HLA Class I antibody testing is performed with a  solid phase multiplex single antigen bead array method.  A negative result does not exclude the presence of low level antibody.  Tacrolimus, Timed 08/29/2022 3.5  ng/mL Final    Phosphorus 08/29/2022 3.3  2.4 - 5.1 mg/dL Final    Magnesium 32/20/2542 2.1  1.6 - 2.6 mg/dL Final    Sodium 70/62/3762 140  135 - 145 mmol/L Final    Potassium 08/29/2022 4.7  3.4 - 4.8 mmol/L Final    Chloride 08/29/2022 105  98 - 107 mmol/L Final    CO2 08/29/2022 29.6  20.0 - 31.0 mmol/L Final    Anion Gap 08/29/2022 5  5 - 14 mmol/L Final    BUN 08/29/2022 29 (H)  9 - 23 mg/dL Final    Creatinine 83/15/1761 1.26 (H)  0.73 - 1.18 mg/dL Final    BUN/Creatinine Ratio 08/29/2022 23   Final    eGFR CKD-EPI (2021) Male 08/29/2022 59 (L)  >=60 mL/min/1.39m2 Final    eGFR calculated with CKD-EPI 2021 equation in accordance with [employer] and [employer] of Nephrology Task Force recommendations.    Glucose 08/29/2022 77  70 - 99 mg/dL Final    Calcium 60/73/7106 9.1  8.7 - 10.4 mg/dL Final    Albumin 26/94/8546 3.4  3.4 - 5.0 g/dL Final    Total Protein 08/29/2022 6.8  5.7 - 8.2 g/dL Final    Total Bilirubin 08/29/2022 0.6  0.3 - 1.2 mg/dL Final    AST 27/08/5007 22  <=34 U/L Final    ALT 08/29/2022 12  10 - 49 U/L Final    Alkaline Phosphatase 08/29/2022 90  46 - 116 U/L Final    CMV Viral Ld 08/29/2022 Not Detected  Not Detected Final    BK Blood Result 08/29/2022 Not Detected  Not Detected Final    WBC 08/29/2022 10.1  3.6 - 11.2 10*9/L Final    RBC 08/29/2022 5.24  4.26 - 5.60 10*12/L Final    HGB 08/29/2022 14.6  12.9 - 16.5 g/dL Final    HCT 38/18/2993 43.8  39.0 - 48.0 % Final    MCV 08/29/2022 83.6  77.6 - 95.7 fL Final    MCH 08/29/2022 27.9  25.9 - 32.4 pg Final    MCHC 08/29/2022 33.4  32.0 - 36.0 g/dL Final    RDW 71/69/6789 14.7  12.2 - 15.2 % Final    MPV 08/29/2022 7.7  6.8 - 10.7 fL Final    Platelet 08/29/2022 219  150 - 450 10*9/L Final    Neutrophils % 08/29/2022 83.6  % Final    Lymphocytes % 08/29/2022 8.9  % Final    Monocytes % 08/29/2022 6.6  % Final    Eosinophils % 08/29/2022 0.7  % Final    Basophils % 08/29/2022 0.2  % Final    Absolute Neutrophils 08/29/2022 8.4 (H)  1.8 - 7.8 10*9/L Final    Absolute Lymphocytes 08/29/2022 0.9 (L)  1.1 - 3.6 10*9/L Final    Absolute Monocytes 08/29/2022 0.7  0.3 - 0.8 10*9/L Final    Absolute Eosinophils 08/29/2022 0.1  0.0 - 0.5 10*9/L Final    Absolute Basophils 08/29/2022 0.0  0.0 - 0.1 10*9/L Final     Lab Results   Component Value Date    Hemoglobin A1C 7.1 (H) 08/29/2022    Hemoglobin A1C 8.3 (H) 11/28/2021    Hemoglobin A1C 8.4 (H) 09/20/2021    Hemoglobin A1C 8.6 (H) 07/28/2021    HB A1C, RAP/HGATE 6.7 (H) 04/07/2012    HB A1C, RAP/HGATE 7.4 (H) 03/30/2011    HGB A1C, POC 7.6 (H) 03/28/2022    HGB A1C,  POC 8.8 (H) 07/28/2020    HGB A1C, POC 7.3 (H) 01/27/2020    HGB A1C, POC 9.5 (H) 11/18/2018    Hemoglobin A1c 7.6 (H) 05/17/2014     No components found for: SODIUM  Lab Results   Component Value Date    K 4.7 08/29/2022     Lab Results   Component Value Date    CREATININE 1.26 (H) 08/29/2022     Lab Results   Component Value Date    PTH 114.5 (H) 08/21/2019    CALCIUM 9.1 08/29/2022     Lab Results   Component Value Date    ALT 12 08/29/2022    ALT 22 04/21/2015     Lab Results   Component Value Date    CHOL 142 04/16/2022     Lab Results   Component Value Date    LDL 80 04/16/2022     Lab Results   Component Value Date    HDL 35 (L) 04/16/2022     Lab Results   Component Value Date    TRIG 137 04/16/2022     Lab Results   Component Value Date    TSH 1.164 04/02/2022    TSH 2.202 07/02/2017    TSH 1.840 07/13/2015     Lab Results   Component Value Date    TSH 1.164 04/02/2022     No results found for: CBC  No results found for: VITAMINB12  Lab Results   Component Value Date    VITD2 <5 08/17/2015    VITD3 42 08/17/2015    VITDTOTAL 31.9 12/28/2021     No results found for: Christa See, MSHCGMOM, MALBCRERAT  Lab Results   Component Value Date    Protein, Ur 37.1 08/29/2022    Protein, Ur 15 08/05/2014    Protein/Creatinine Ratio, Urine 0.330 08/29/2022    Protein/Creatinine Ratio, Urine 0.116 08/05/2014       IMAGING:

## 2022-09-26 NOTE — Unmapped (Signed)
ASSESSMENT / PLAN  1. 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)  2. Encounter for long-term (current) use of insulin (CMS-HCC)  Maintained on mdi of insulin with estimated HgbA1c 6.7 (per CGM) and 5% hypoglycemia.   This is in part due to loss of appetite and reduced intake that persists 3 weeks after last dose of GLP1a suspicious for gastroparesis.  Maintains low dose SGLT2i in part for renal protection.    With reduced appetite and reduced intake, you are having less hyperglycemia, but also more hypoglycemia.   We need to reduce insulin to reflect this change.     Reduce once daily long acting insulin Evaristo Bury changing to [place] due to insurance request) to 60units intially.   If you continue to have overnight glucoses < 90, reduce another 5units at a time.     Reduce Humalog at meal times:   100-200 10 units  200-300 15 units    I would not restart Trulicity nor ozempic with low appetite and early satiety.   (For now, I am not removing from med list due to difficulty getting approval, and in case we re-start in future).    3. Diabetic polyneuropathy associated with type 2 diabetes mellitus (CMS-HCC)  4. PAD (peripheral artery disease) (CMS-HCC)  5. Amputated toe, unspecified laterality (CMS-HCC)  I will complete Limbionic form w foot exam documented below.  MEDICARE:  1. I am treating this patient under comprehensive plan of care for diabetes and have discussed proper foot care with the patient.   2. Patient has:  -history of partial or complete amputation of the foot  -peripheral neuropathy with evidence of callus formation  -foot deformity  -poor circulation  3. I agree the patient will benefit from diabetes shoes and custom inserts as prescribed.     MEDICARE:  *The patient is currently using the therapeutic CGM as prescribed.  *The patient has diabetes.  *The patient is insulin-treated with 3 or more daily administrations of insulin (MDII)  prior to use of a CGM.   *The patient???s insulin treatment regimen requires frequent adjustments by the patient based on sliding scale.  *Within the last six months, I had an in-person visit with the beneficiary to evaluate their diabetes control and determine that the above criteria are met.   *Every six months I will continue to have an in-person visit with the beneficiary to assess adherence to their CGM regimen and diabetes treatment plan.      Return in about 6 months (around 03/28/2023).      SUBJECTIVE  PCP: [name]  Tplant: Amy Mottle  Optho:  Herma Ard  Wound: Keagy  Podiatry: Dr. Ether Griffins, [facility]   Derm: Davy Pique, [facility]    HPI: David Ellis is a 77 y.o. male who I last saw 03/2022 returns for follow up of T2DM with hx of kidney transplant.    Here w wife [name].    08/2022 HgbA1c 7.1     Due to national shortage of Trulicity 1.5mg  (last dose 5 wks ago), obtained approval for ozempic 0.5mg  which he took x 2 doses (last dose 3 weeks ago), but stopped b/c SE abdominal pain/nausea.    No longer having abdominal pain, nor nausea, but irritable bowel and  +bloating with early satiety.  Relying on Peptobismol.  No diarrhea/constipation. [Several weeks ago had dark stools that resolved. No Fe. They were eating beets. Colonoscopy 02/2022]    He has had reduced appetite for 15mo to a  year -- prior to start of ozempic, and persists off all GLP1a.  4 lb loss since 03/2022 visit, and GFR stable, yet reduced insulin requirements and increase in hypoglycemia.     Currently Maintains:  Freestyle Libre from [place].  Evaristo Bury 65units (was on 70units last visit w concerns risk hypoglycemia), but due to insurance, will be changing to glargine u300 (Toujeo) .   Maintains Humalog max up to 25u TIDAC [if <100, does not take, if 100-150 takes 10units, if 150-200 takes 15units, etc]  Jardiance 5mg .     Review of glucoses in his recorded chart show that several days this past week when glucoses were tihgtly controlled he was not even receiving any Humalog for 24hrs at a time confirming reduced needs of Humalog, and of basal insulin.     CGM downloaded and interpreted x 72 hours or more. 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 Restarting ARB and SGLT2i.  Peripheral neuropathy w hx of amputation.  On Neurontin.  Foot exam today. Providing limbionic forms to complete today.    08/29/22 Dr. Margaretmary Bayley felt he was deydrated w 96/68, stopped lasix/losartan.  HTN controlled today on jardiance 5mg  (1/2 of 10mg ), also metoprolol. Has just received losartan 25mg  to restart. Has lasix prn edema (no edema).  Lipids on pravastatin 40.      Wife is concerned he is depressed.  Not getting out enough.   He feels he is tired. Gets tired standing more so than walking. No dypsnea.  Has recently completed PT for arm fracture, but now has been offered PT for balance.  08/2022 no anemia.     Endocrine Obtained / Updated Problem List:   1. T2DM dx 1992, insulin roughly 2000:   Proliferative DR s/p hx vitreous hemorrhage and endolaser therapy..  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 No monofilament 07/2021  Hypoglycemic emergency requiring EMS 03/2011.   Historically, Victoza, Ozempic, Trulicity were not covered or too $$.   Offered to check cpeptide to see if he would qualify for insulin pump under medcare, though was not interested.    Off MTF since end of 2015/early 2016.   2. 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   3. HTN   4. Nephrolithiasis   5. OSA on cpap   6. Aflutter s/p ablation 06/2011, atrial fibrillation  7. PE    Patient Active Problem List   Diagnosis    Type II diabetes mellitus with renal manifestations, uncontrolled    Essential (primary) hypertension    History of kidney transplant    Mixed hyperlipidemia    Retinopathy due to secondary diabetes (CMS-HCC)    Transplanted kidney    Other pulmonary embolism without acute cor pulmonale (CMS-HCC)    Edema    Right ventricular dysfunction    Atrial fibrillation (CMS-HCC)    Aftercare following organ transplant    Diverticulosis of colon    Routine general medical examination at a health care facility    OSA on CPAP    Diabetic polyneuropathy associated with type 2 diabetes mellitus (CMS-HCC)    Diabetic ulcer  of left foot associated with type 2 diabetes mellitus (CMS-HCC)    Toe amputation status    Left knee pain    BCC (basal cell carcinoma), face    Acquired absence of other toe(s), unspecified side (CMS-HCC)    History of colonic polyps    Type 2 diabetes mellitus with foot ulcer (CODE) (CMS-HCC)    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)    Hypoglycemia associated with type 2 diabetes mellitus (CMS-HCC)    Class 2 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    Immunosuppressed status (CMS-HCC)    Syncope    Non-healing open wound of toe    At high risk for falls    Encounter for screening colonoscopy    Arthralgia of right elbow    Pain in joint of right shoulder    Pain of right hip joint     Past Medical History:   Diagnosis Date    Arthritis     Atrial flutter (CMS-HCC)     Cataract     Diabetes mellitus (CMS-HCC)     Diabetic nephropathy (CMS-HCC)     Diabetic retinopathy (CMS-HCC)     Fractures     Ganglion cyst     Hand injury     Heart disease     Hyperlipidemia     Hypertension     Impaired mobility     Joint pain     Lack of access to transportation     Osteomyelitis (CMS-HCC) 11/2014    Pulmonary embolism (CMS-HCC) 05/2014    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      aspirin (ECOTRIN) 81 MG tablet Take 1 tablet (81 mg total) by mouth daily as needed (when flying).      blood-glucose meter kit Use as instructed. 1 each 0    cholecalciferol, vitamin D3-50 mcg, 2,000 unit,, 50 mcg (2,000 unit) tablet Take 1 tablet (50 mcg total) by mouth daily.      dulaglutide (TRULICITY) 1.5 mg/0.5 mL PnIj Inject 0.5 mL (1.5 mg total) under the skin every seven (7) days. 2 mL 11    dulaglutide (TRULICITY) 1.5 mg/0.5 mL PnIj Inject 0.5 mL (1.5 mg total) under the skin once a week. 2 mL 11    empagliflozin (JARDIANCE) 10 mg tablet Take 1 tablet (10 mg total) by mouth every morning. (Patient taking differently: Take 0.5 tablets (5 mg total) by mouth every morning.) 90 tablet 3    empagliflozin (JARDIANCE) 10 mg tablet Take 1 tablet (10 mg total) by mouth daily.      flash glucose sensor (FREESTYLE LIBRE 14 DAY SENSOR) kit by Other route every fourteen (14) days. 2 each 11    furosemide (LASIX) 40 MG tablet Take 1 tablet (40 mg total) by mouth daily. 40 mg daily. And 1 additional tablet for edema.      gabapentin (NEURONTIN) 300 MG capsule TAKE 1 CAPSULE BY MOUTH TWICE DAILY AS DIRECTED 180 capsule 3    HUMALOG KWIKPEN INSULIN 100 unit/mL injection pen INJECT 15-25 UNITS WITH MEALS + CORRECTION **MAX 100 UNITS A DAY** 60 mL 3    hydrocortisone 2.5 % cream  insulin degludec 200 unit/mL (3 mL) InPn Inject 0.4 mL (80 Units total) under the skin daily. Titrate up to 100units daily as instructed. (Patient taking differently: Inject 0.325 mL (65 Units total) under the skin daily. Titrate up to 100units daily as instructed.) 45 mL 3    insulin glargine U-300 conc (TOUJEO SOLOSTAR) 300 unit/mL (1.5 mL) injection pen Inject 70 Units under the skin daily. 24 mL 3    ketoconazole (NIZORAL) 2 % cream       LOKELMA 10 gram PwPk packet DOSE: 1 PACKET (10 grams) every other day. Empty entire contents of ONE packet into a glass with 45 mL of water. Stir well and drink immediately; if powder remains in the glass, add water, stir and drink immediately; repeat until no powder remains. (Patient taking differently: DOSE: 1 PACKET (10 grams) every other day.  Empty entire contents of ONE packet into a glass with 45 mL of water. Stir well and drink immediately; if powder remains in the glass, add water, stir and drink immediately; repeat until no powder remains.) 11 packet 0    losartan (COZAAR) 25 MG tablet TAKE 1 TABLET BY MOUTH DAILY. 90 tablet 3    magnesium chloride (SLOW_MAG) 64 mg TbEC Take 128 mg by mouth three (3) times a day (at 6am, noon and 6pm). (2 tablets) 9am 1 pm and 9 pm      metoPROLOL succinate (TOPROL-XL) 100 MG 24 hr tablet TAKE ONE TABLET TWICE DAILY 180 tablet 1    miscellaneous medical supply Misc 1 application by Miscellaneous route daily as needed. Lightweight wheelchair. 1 each 0    miscellaneous medical supply Misc 1 application by Miscellaneous route nightly. Recommend CPAP 14 cm H2O with EPR 3 and heated humidifier for nasal dryness, mask: ResMed Activa Lt nasal mask (Large). 1 each 0    multivitamin capsule Take 1 capsule by mouth daily.      MYFORTIC 180 mg EC tablet Take 3 tablets (540 mg total) by mouth two (2) times a day. 540 tablet 3    PEN NEEDLE 32 gauge x 5/32 (4 mm) Ndle USE 3 TIMES DAILY BEFORE MEALS 300 each 3    pravastatin (PRAVACHOL) 40 MG tablet Take 1 tablet (40 mg total) by mouth daily. 90 tablet 3    semaglutide (OZEMPIC) 0.25 mg or 0.5 mg (2 mg/3 mL) PnIj Inject 0.5 mg under the skin once a week. 9 mL 3    tacrolimus (PROGRAF) 1 MG capsule Take 3 capsules (3 mg total) by mouth in the morning AND 2 capsules (2 mg total) in the evening. 450 capsule 3    TRULICITY 1.5 mg/0.5 mL PnIj INJECT 0.5ML (1.5MG  TOTAL) UNDER THE SKIN EVERY 7 DAYS 2 mL 11    warfarin (JANTOVEN) 5 MG tablet TAKE 1 TABLET BY MOUTH DAILY WITH EVENING MEAL. TAKE 6 DAYS A WEEK (TUESDAY THROUGH SUNDAY) AS DIRECTED. 90 tablet 3     No current facility-administered medications for this visit.     Facility-Administered Medications Ordered in Other Visits   Medication Dose Route Frequency Provider Last Rate Last Admin    bevacizumab    PRN (once a day) Melvyn Novas, MD   2.5 mg at 01/28/17 1610       Social History: reviewed: his wife was kidney donor. rare etoh. no cig. 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 Grew up in [place].  reports that he has never smoked. He has never been exposed to tobacco smoke. He has never used smokeless tobacco. He  reports that he does not drink alcohol and does not use drugs.    Family History: 47yo son has uncertain clotting disorder. family history includes Cancer in his father; Diabetes in his maternal grandmother and mother; Hypertension in his mother; Kidney disease in his father, maternal grandmother, and mother; Ulcers in his father.    OBJECTIVE   Vitals:    09/26/22 1310   BP: 122/66   Pulse: 70   Temp: 35.8 ??C (96.5 ??F)   Weight: (!) 107.3 kg (236 lb 9.6 oz)     Body mass index is 31.22 kg/m??.    BP Readings from Last 3 Encounters:   09/26/22 122/66   09/17/22 135/69   08/29/22 96/68     Wt Readings from Last 3 Encounters:   09/26/22 (!) 107.3 kg (236 lb 9.6 oz)   09/17/22 (!) 107 kg (236 lb)   08/29/22 (!) 107.8 kg (237 lb 9.6 oz)     calm, cooperative with flat affect, appears fatigued, but maintains sense of humor  eomi, sclera anicteric  LE:  No pitting edema  Scaley dry skin throughout legs/feet  No DP nor PT detected bilaterally  S/p amputation of left 4/5 toes with incision intact  R lateral foot with lateral boney protuberance/foot deformity w callous    Pertinent labs:  Office Visit on 09/26/2022   Component Date Value Ref Range Status    Glucose, POC 09/26/2022 97  70 - 179 mg/dL Final   Appointment on 08/29/2022   Component Date Value Ref Range Status    Color, UA 08/29/2022 Yellow   Final    Clarity, UA 08/29/2022 Clear   Final    Specific Gravity, UA 08/29/2022 1.021  1.003 - 1.030 Final    pH, UA 08/29/2022 5.5  5.0 - 9.0 Final    Leukocyte Esterase, UA 08/29/2022 Negative  Negative Final    Nitrite, UA 08/29/2022 Negative  Negative Final    Protein, UA 08/29/2022 30 mg/dL (A)  Negative Final    Glucose, UA 08/29/2022 >1000 mg/dL (A)  Negative Final    Ketones, UA 08/29/2022 Negative  Negative Final    Urobilinogen, UA 08/29/2022 <2.0 mg/dL  <1.6 mg/dL Final    Bilirubin, UA 08/29/2022 Negative  Negative Final    Blood, UA 08/29/2022 Negative  Negative Final    RBC, UA 08/29/2022 2  <=3 /HPF Final    WBC, UA 08/29/2022 1  <=2 /HPF Final    Squam Epithel, UA 08/29/2022 <1  0 - 5 /HPF Final    Bacteria, UA 08/29/2022 None Seen  None Seen /HPF Final    Mucus, UA 08/29/2022 Rare (A)  None Seen /HPF Final    Creat U 08/29/2022 112.5  Undefined mg/dL Final    Protein, Ur 10/96/0454 37.1  Undefined mg/dL Final    Protein/Creatinine Ratio, Urine 08/29/2022 0.330  Undefined Final    Urine Culture, Comprehensive 08/29/2022 NO GROWTH   Final   Appointment on 08/29/2022   Component Date Value Ref Range Status    Hemoglobin A1C 08/29/2022 7.1 (H)  4.8 - 5.6 % Final    Estimated Average Glucose 08/29/2022 157  mg/dL Final    Donor ID 09/81/1912 [id number]   Final    Donor HLA-A Antigen #1 08/29/2022 A31   Final    Anti-Donor HLA-A #1 MFI 08/29/2022 0  <1000 MFI Final    Donor HLA-B Antigen #1 08/29/2022 B39   Final    Anti-Donor HLA-B #1 MFI 08/29/2022 8  <1000 MFI  Final    Donor HLA-B Antigen #2 08/29/2022 B62   Final    Anti-Donor HLA-B #2 MFI 08/29/2022 39  <1000 MFI Final    Donor HLA-C Antigen #1 08/29/2022 C4   Final    Anti-Donor HLA-C #1 MFI 08/29/2022 0  <1000 MFI Final    Donor HLA-C Antigen #2 08/29/2022 C12   Final    Anti-Donor HLA-C #2 MFI 08/29/2022 3  <1000 MFI Final    Donor HLA-DR Antigen #1 08/29/2022 DR1   Final    Anti-Donor HLA-DR #1 MFI 08/29/2022 157  <1000 MFI Final    Donor HLA-DR Antigen #2 08/29/2022 DR4   Final    Anti-Donor HLA-DR #2 MFI 08/29/2022 623  <1000 MFI Final    Donor DRw Antigen #2 08/29/2022 DR53   Final    Anti-Donor DRw #2 MFI 08/29/2022 7  <1000 MFI Final    Donor HLA-DQB Antigen #1 08/29/2022 DQ5   Final    Anti-Donor HLA-DQB #1 MFI 08/29/2022 15  <1000 MFI Final    Donor HLA-DQB Antigen #2 08/29/2022 DQ7   Final    Anti-Donor HLA-DQB #2 MFI 08/29/2022 71  <1000 MFI Final    DSA Comment 08/29/2022    Final    Comment: The HLA antigens listed are donor antigens and the corresponding MFI value.  MFI values >= 1000 are considered positive.  A negative result does not exclude the presence of low level DSA.  Blank donor antigen and MFI fields indicate unavailable donor HLA   type or lack of appropriate single antigen bead to determine DSA.  As such, the presence or absence of DSA to the locus cannot be confirmed.     Donor specific antibody (DSA) testing is performed with a  solid phase multiplex single antigen bead array method.  All procedures and reagents have been validated and performance characteristics determined by the [facility].    Certain of these tests have not been cleared/approved by the U.S. Food and Drug Administration (FDA).  The FDA has determined that such approval/clearance is not necessary because this laboratory is certified under the Clinical Laboratory Improvements   Amendments to perform high complexity testing.  This test is used for clinical purposes.  It                            should not be regarded as investigational or for research.  All HLA typings are performed using molecular methodologies.  HLA-A, B, C, DR, and DQ results are   serological equivalents of the molecular type.        HLA Class 2 Antibody Result 08/29/2022 Negative   Final    HLA Class 2 Antibody Comment 08/29/2022    Final    All procedures and reagents have been validated and performance characteristics determined by the [facility]. Certain of these tests have not been cleared/approved by the U.S. Food and Drug Administration (FDA). The FDA has determined   that such approval/clearance is not necessary because this laboratory is certified under the Clinical Laboratory Improvement Amendments to perform high complexity testing. This test is used for clinical purposes. It should not be regarded as   investigational or for research. All HLA typings performed using molecular methodologies. HLA-A, B, C, DR, and DQ results are serological equivalents of the molecular type.  MFI values => 1000 are considered positive.  HLA Class I antibody testing is performed with a  solid phase multiplex single  antigen bead array method.  A negative result does not exclude the presence of low level antibody.    HLA Class 1 Antibody Result 08/29/2022 Negative   Final    HLA Class 1 Antibody Comment 08/29/2022    Final    All procedures and reagents have been validated and performance characteristics determined by the [facility]. Certain of these tests have not been cleared/approved by the U.S. Food and Drug Administration (FDA). The FDA has determined   that such approval/clearance is not necessary because this laboratory is certified under the Clinical Laboratory Improvement Amendments to perform high complexity testing. This test is used for clinical purposes. It should not be regarded as   investigational or for research. All HLA typings performed using molecular methodologies. HLA-A, B, C, DR, and DQ results are serological equivalents of the molecular type.  MFI values => 1000 are considered positive.  HLA Class I antibody testing is performed with a  solid phase multiplex single antigen bead array method.  A negative result does not exclude the presence of low level antibody.    Tacrolimus, Timed 08/29/2022 3.5  ng/mL Final    Phosphorus 08/29/2022 3.3  2.4 - 5.1 mg/dL Final    Magnesium 16/03/9603 2.1  1.6 - 2.6 mg/dL Final    Sodium 54/02/8118 140  135 - 145 mmol/L Final    Potassium 08/29/2022 4.7  3.4 - 4.8 mmol/L Final    Chloride 08/29/2022 105  98 - 107 mmol/L Final    CO2 08/29/2022 29.6  20.0 - 31.0 mmol/L Final    Anion Gap 08/29/2022 5  5 - 14 mmol/L Final    BUN 08/29/2022 29 (H)  9 - 23 mg/dL Final    Creatinine 14/78/2956 1.26 (H)  0.73 - 1.18 mg/dL Final    BUN/Creatinine Ratio 08/29/2022 23   Final    eGFR CKD-EPI (2021) Male 08/29/2022 59 (L)  >=60 mL/min/1.8m2 Final    eGFR calculated with CKD-EPI 2021 equation in accordance with [employer] and [employer] of Nephrology Task Force recommendations.    Glucose 08/29/2022 77  70 - 99 mg/dL Final    Calcium 21/30/8657 9.1  8.7 - 10.4 mg/dL Final    Albumin 84/69/6295 3.4  3.4 - 5.0 g/dL Final    Total Protein 08/29/2022 6.8  5.7 - 8.2 g/dL Final    Total Bilirubin 08/29/2022 0.6  0.3 - 1.2 mg/dL Final    AST 28/41/3244 22  <=34 U/L Final    ALT 08/29/2022 12  10 - 49 U/L Final    Alkaline Phosphatase 08/29/2022 90  46 - 116 U/L Final    CMV Viral Ld 08/29/2022 Not Detected  Not Detected Final    BK Blood Result 08/29/2022 Not Detected  Not Detected Final    WBC 08/29/2022 10.1  3.6 - 11.2 10*9/L Final    RBC 08/29/2022 5.24  4.26 - 5.60 10*12/L Final    HGB 08/29/2022 14.6  12.9 - 16.5 g/dL Final    HCT 06/06/7251 43.8  39.0 - 48.0 % Final    MCV 08/29/2022 83.6  77.6 - 95.7 fL Final    MCH 08/29/2022 27.9  25.9 - 32.4 pg Final    MCHC 08/29/2022 33.4  32.0 - 36.0 g/dL Final    RDW 66/44/0347 14.7  12.2 - 15.2 % Final    MPV 08/29/2022 7.7  6.8 - 10.7 fL Final    Platelet 08/29/2022 219  150 - 450 10*9/L Final    Neutrophils %  08/29/2022 83.6  % Final    Lymphocytes % 08/29/2022 8.9  % Final    Monocytes % 08/29/2022 6.6  % Final    Eosinophils % 08/29/2022 0.7  % Final    Basophils % 08/29/2022 0.2  % Final    Absolute Neutrophils 08/29/2022 8.4 (H)  1.8 - 7.8 10*9/L Final    Absolute Lymphocytes 08/29/2022 0.9 (L)  1.1 - 3.6 10*9/L Final    Absolute Monocytes 08/29/2022 0.7  0.3 - 0.8 10*9/L Final    Absolute Eosinophils 08/29/2022 0.1 0.0 - 0.5 10*9/L Final    Absolute Basophils 08/29/2022 0.0  0.0 - 0.1 10*9/L Final     Lab Results   Component Value Date    Hemoglobin A1C 7.1 (H) 08/29/2022    Hemoglobin A1C 8.3 (H) 11/28/2021    Hemoglobin A1C 8.4 (H) 09/20/2021    Hemoglobin A1C 8.6 (H) 07/28/2021    HB A1C, RAP/HGATE 6.7 (H) 04/07/2012    HB A1C, RAP/HGATE 7.4 (H) 03/30/2011    HGB A1C, POC 7.6 (H) 03/28/2022    HGB A1C, POC 8.8 (H) 07/28/2020    HGB A1C, POC 7.3 (H) 01/27/2020    HGB A1C, POC 9.5 (H) 11/18/2018    Hemoglobin A1c 7.6 (H) 05/17/2014     No components found for: SODIUM  Lab Results   Component Value Date    K 4.7 08/29/2022     Lab Results   Component Value Date    CREATININE 1.26 (H) 08/29/2022     Lab Results   Component Value Date    PTH 114.5 (H) 08/21/2019    CALCIUM 9.1 08/29/2022     Lab Results   Component Value Date    ALT 12 08/29/2022    ALT 22 04/21/2015     Lab Results   Component Value Date    CHOL 142 04/16/2022     Lab Results   Component Value Date    LDL 80 04/16/2022     Lab Results   Component Value Date    HDL 35 (L) 04/16/2022     Lab Results   Component Value Date    TRIG 137 04/16/2022     Lab Results   Component Value Date    TSH 1.164 04/02/2022    TSH 2.202 07/02/2017    TSH 1.840 07/13/2015     Lab Results   Component Value Date    TSH 1.164 04/02/2022     No results found for: CBC  No results found for: VITAMINB12  Lab Results   Component Value Date    VITD2 <5 08/17/2015    VITD3 42 08/17/2015    VITDTOTAL 31.9 12/28/2021     No results found for: Christa See, MSHCGMOM, MALBCRERAT  Lab Results   Component Value Date    Protein, Ur 37.1 08/29/2022    Protein, Ur 15 08/05/2014    Protein/Creatinine Ratio, Urine 0.330 08/29/2022    Protein/Creatinine Ratio, Urine 0.116 08/05/2014       IMAGING:

## 2022-10-01 DIAGNOSIS — R109 Unspecified abdominal pain: Principal | ICD-10-CM

## 2022-10-02 ENCOUNTER — Ambulatory Visit: Admit: 2022-10-02 | Discharge: 2022-10-03 | Payer: MEDICARE

## 2022-10-02 DIAGNOSIS — I4891 Unspecified atrial fibrillation: Principal | ICD-10-CM

## 2022-10-02 LAB — PROTIME-INR
INR: 8.86
INR: 9.89
INR: 9.96
PROTIME: 92.3 s (ref 9.9–12.6)
PROTIME: 102.7 s (ref 9.9–12.6)
PROTIME: 103.4 s (ref 9.9–12.6)

## 2022-10-02 LAB — RENAL FUNCTION PANEL
ALBUMIN: 3.1 g/dL — ABNORMAL LOW (ref 3.4–5.0)
ANION GAP: 4 mmol/L — ABNORMAL LOW (ref 5–14)
BLOOD UREA NITROGEN: 24 mg/dL — ABNORMAL HIGH (ref 9–23)
BUN / CREAT RATIO: 20
CALCIUM: 9.2 mg/dL (ref 8.7–10.4)
CHLORIDE: 110 mmol/L — ABNORMAL HIGH (ref 98–107)
CO2: 26.6 mmol/L (ref 20.0–31.0)
CREATININE: 1.2 mg/dL — ABNORMAL HIGH
EGFR CKD-EPI (2021) MALE: 63 mL/min/{1.73_m2} (ref >=60)
GLUCOSE RANDOM: 115 mg/dL (ref 70–179)
PHOSPHORUS: 3.2 mg/dL (ref 2.4–5.1)
POTASSIUM: 5.2 mmol/L — ABNORMAL HIGH (ref 3.4–4.8)
SODIUM: 141 mmol/L (ref 135–145)

## 2022-10-02 LAB — TACROLIMUS LEVEL, TROUGH: TACROLIMUS, TROUGH: 5 ng/mL (ref 5.0–15.0)

## 2022-10-02 LAB — CBC W/ AUTO DIFF
BASOPHILS ABSOLUTE COUNT: 0 10*9/L (ref 0.0–0.1)
BASOPHILS RELATIVE PERCENT: 0.5 %
EOSINOPHILS ABSOLUTE COUNT: 0.1 10*9/L (ref 0.0–0.5)
EOSINOPHILS RELATIVE PERCENT: 0.8 %
HEMATOCRIT: 42.8 % (ref 39.0–48.0)
HEMOGLOBIN: 14.2 g/dL (ref 12.9–16.5)
LYMPHOCYTES ABSOLUTE COUNT: 0.9 10*9/L — ABNORMAL LOW (ref 1.1–3.6)
LYMPHOCYTES RELATIVE PERCENT: 11.5 %
MEAN CORPUSCULAR HEMOGLOBIN CONC: 33.3 g/dL (ref 32.0–36.0)
MEAN CORPUSCULAR HEMOGLOBIN: 28.1 pg (ref 25.9–32.4)
MEAN CORPUSCULAR VOLUME: 84.3 fL (ref 77.6–95.7)
MEAN PLATELET VOLUME: 7.3 fL (ref 6.8–10.7)
MONOCYTES ABSOLUTE COUNT: 0.6 10*9/L (ref 0.3–0.8)
MONOCYTES RELATIVE PERCENT: 7.9 %
NEUTROPHILS ABSOLUTE COUNT: 6.2 10*9/L (ref 1.8–7.8)
NEUTROPHILS RELATIVE PERCENT: 79.3 %
NUCLEATED RED BLOOD CELLS: 0 /100{WBCs} (ref <=4)
PLATELET COUNT: 265 10*9/L (ref 150–450)
RED BLOOD CELL COUNT: 5.07 10*12/L (ref 4.26–5.60)
RED CELL DISTRIBUTION WIDTH: 15 % (ref 12.2–15.2)
WBC ADJUSTED: 7.9 10*9/L (ref 3.6–11.2)

## 2022-10-02 LAB — HEPATIC FUNCTION PANEL
ALBUMIN: 3.1 g/dL — ABNORMAL LOW (ref 3.4–5.0)
ALKALINE PHOSPHATASE: 80 U/L (ref 46–116)
ALT (SGPT): 11 U/L (ref 10–49)
AST (SGOT): 22 U/L (ref <=34)
BILIRUBIN DIRECT: 0.1 mg/dL (ref 0.00–0.30)
BILIRUBIN TOTAL: 0.5 mg/dL (ref 0.3–1.2)
PROTEIN TOTAL: 6.1 g/dL (ref 5.7–8.2)

## 2022-10-02 LAB — MAGNESIUM: MAGNESIUM: 2 mg/dL (ref 1.6–2.6)

## 2022-10-02 LAB — LIPASE: LIPASE: 28 U/L (ref 12–53)

## 2022-10-02 NOTE — Unmapped (Signed)
Emergency Department Provider Note      ED Clinical Impression     Final diagnoses:   Supratherapeutic INR (Primary)       Initial Impression, ED Course, Assessment and Plan     David Ellis is a 77 y.o. male with past medical history of atrial flutter, diabetes, diabetic retinopathy, neuropathy, pulmonary embolism, stroke and renal transplant on tacrolimus who presents with elevated INR.  Patient had routine INR checked by his primary care provider today and it was found to be significantly elevated.  He was sent to the hospital for confirmation test, and INR was 9.89.  He has had no bleeding, specifically no bleeding from his gums, ears, nose.  States he had some dark stool several weeks ago associated with some intermittent abdominal discomfort that is worse with eating.  States he was seen by his primary care provider for similar and outpatient ultrasound of his gallbladder was ordered.  Patient states he has been eating a bland diet, and pain has improved in the last 2 days.    On exam he appears in no acute distress, but does appear chronically ill.  His vitals are stable.  He has no significant abdominal tenderness with palpation.  He has no lower extremity edema.  His lung sounds are clear.  No active bleeding from the nose, or gums.    Labs have been reviewed and the [facility] is normal.  H&H is normal.  Platelet count is normal.  Creatinine is 1.20 which is baseline for the patient.  Initial INR was 8.86.  Recheck shortly after noon was 9.89.  Mag is normal.    I have pulled the warfarin dosing and administration policy for [facility].  Does recommend with INR of 6-10 does recommend holding Coumadin and rechecking INR until within therapeutic range with reducing Coumadin dose when restarting by 15 to 25%.    I have discussed with patient giving how close his INR is to 10 with his comorbidities I would recommend admission for monitoring of his INR.  It has climbed 1 point just today, and again his last dose of Coumadin was last night.  He has no signs of active bleeding currently, and is hemodynamically stable with normal H&H, as well as no recent trauma.  There is no indication for IV vitamin K.  I do recommend close monitoring of his INR as an inpatient, and patient has been notified.  I will also CT his abdomen.    4:15 PM  Case signed out to Dr Darryl Nestle pending CT/imaging.          Labs     Labs Reviewed   HEPATIC FUNCTION PANEL   LIPASE         History     Chief Complaint  Abnormal Lab      HPI   Patient was seen by me at 4:00 PM.    Patient is a 77 y.o. male with past medical history of atrial flutter, diabetes, diabetic neuropathy, retinopathy, pulmonary embolism, stroke, and kidney transplant on tacrolimus who presents for elevated INR.  Patient had routine INR check this morning by his primary care provider, Dr. Dellis Filbert.  INR was elevated at 8.86.  It was last checked in February of this year and was 2.37.  According to his wife he has been taking his Coumadin 5 mg nightly.  His last dose was last night.  He was actually sent to the hospital for recheck INR which was drawn just after  noon and was 9.89.  According to the patient he has had no bleeding from his gums, nose, no blood in his urine.  States he did have some dark stool 2 to 3 weeks ago that lasted for several days, but then subsided without intervention.  His last bowel movement was this morning, and was normal.  He denies any chest pain or difficulty breathing.  No lower extremity swelling.  He has had no fever, or recent sickness.    Patient tells me he has had pain in his abdomen now for the last 2 weeks.  It has been mostly in the left upper aspect of the abdomen.  Eating seems to make it worse.  He was seen by his primary care provider and reports an outpatient ultrasound was ordered of the gallbladder although I do not see an order in the computer.  Patient tells me he has been eating a bland diet, rice and chicken for the last 2 days although he has not had very much to eat today.  He has noticed an improvement in his pain with a bland diet.    Previous chart, nursing notes, and vital signs reviewed.      Pertinent labs & imaging results that were available during my care of the patient were reviewed by me and considered in my medical decision making (see chart for details).    Past Medical History:   Diagnosis Date    Arthritis     Atrial flutter (CMS-HCC)     Cataract     Diabetes mellitus (CMS-HCC)     Diabetic nephropathy (CMS-HCC)     Diabetic retinopathy (CMS-HCC)     Fractures     Ganglion cyst     Hand injury     Heart disease     Hyperlipidemia     Hypertension     Impaired mobility     Joint pain     Lack of access to transportation     Osteomyelitis (CMS-HCC) 11/2014    Pulmonary embolism (CMS-HCC) 05/2014    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    PR AMPUTATION METATARSAL+TOE,SINGLE Left 07/06/2014    Procedure: AMPUTATION, METATARSAL, WITH TOE SINGLE;  Surgeon: Marion Downer, MD;  Location: MAIN OR [facility];  Service: Vascular    PR AMPUTATION METATARSAL+TOE,SINGLE Left 10/28/2014    Procedure: AMPUTATION, METATARSAL, WITH TOE SINGLE;  Surgeon: Maple Mirza, MD;  Location: MAIN OR [place];  Service: Vascular    PR COLONOSCOPY FLX DX W/COLLJ SPEC WHEN PFRMD N/A 02/27/2022    Procedure: COLONOSCOPY, FLEXIBLE, PROXIMAL TO SPLENIC FLEXURE; DIAGNOSTIC, W/WO COLLECTION SPECIMEN BY BRUSH OR WASH;  Surgeon: Luanne Bras, MD;  Location: [facility] PROCED[facility];  Service: Gastroenterology    PR COLONOSCOPY W/BIOPSY SINGLE/MULTIPLE  02/27/2022    Procedure: COLONOSCOPY, FLEXIBLE, PROXIMAL TO SPLENIC FLEXURE; WITH BIOPSY, SINGLE OR MULTIPLE; Surgeon: Luanne Bras, MD;  Location: [facility] PROCED[facility];  Service: Gastroenterology    PR COLSC FLX W/RMVL OF TUMOR POLYP LESION SNARE TQ Left 02/27/2022    Procedure: COLONOSCOPY FLEX; W/REMOV TUMOR/LES BY SNARE;  Surgeon: Luanne Bras, MD;  Location: [facility] PROC[facility];  Service: Gastroenterology    PR Hessie Knows LASER RX Right 11/26/2016    Procedure: VITRECTOMY, MECHANICAL, PARS PLANA APPROACH; WITH ENDOLASER PANRETINAL PHOTOCOAGULATION;  Surgeon: Melvyn Novas, MD;  Location: [facility] OR [facility];  Service: Ophthalmology    PR Hessie Knows LASER RX Left 01/28/2017    Procedure: VITRECTOMY, MECHANICAL, PARS PLANA APPROACH; WITH ENDOLASER PANRETINAL PHOTOCOAGULATION;  Surgeon: Melvyn Novas, MD;  [facility]: [facility] OR [facility];  Service: Ophthalmology    SKIN BIOPSY      VASECTOMY         No current facility-administered medications for this encounter.    Current Outpatient Medications:     acetaminophen (TYLENOL) 325 MG tablet, Take by mouth., Disp: , Rfl:     aspirin (ECOTRIN) 81 MG tablet, Take 1 tablet (81 mg total) by mouth daily as needed (when flying)., Disp: , Rfl:     blood-glucose meter kit, Use as instructed., Disp: 1 each, Rfl: 0    cholecalciferol, vitamin D3-50 mcg, 2,000 unit,, 50 mcg (2,000 unit) tablet, Take 1 tablet (50 mcg total) by mouth daily., Disp: , Rfl:     dulaglutide (TRULICITY) 1.5 mg/0.5 mL PnIj, Inject 0.5 mL (1.5 mg total) under the skin every seven (7) days., Disp: 2 mL, Rfl: 11    dulaglutide (TRULICITY) 1.5 mg/0.5 mL PnIj, Inject 0.5 mL (1.5 mg total) under the skin once a week., Disp: 2 mL, Rfl: 11    empagliflozin (JARDIANCE) 10 mg tablet, Take 1 tablet (10 mg total) by mouth every morning. (Patient taking differently: Take 0.5 tablets (5 mg total) by mouth every morning.), Disp: 90 tablet, Rfl: 3    empagliflozin (JARDIANCE) 10 mg tablet, Take 1 tablet (10 mg total) by mouth daily., Disp: , Rfl:     flash glucose sensor (FREESTYLE LIBRE 14 DAY SENSOR) kit, by Other route every fourteen (14) days., Disp: 2 each, Rfl: 11    furosemide (LASIX) 40 MG tablet, Take 1 tablet (40 mg total) by mouth daily. 40 mg daily. And 1 additional tablet for edema., Disp: , Rfl:     gabapentin (NEURONTIN) 300 MG capsule, TAKE 1 CAPSULE BY MOUTH TWICE DAILY AS DIRECTED, Disp: 180 capsule, Rfl: 3    HUMALOG KWIKPEN INSULIN 100 unit/mL injection pen, INJECT 15-25 UNITS WITH MEALS + CORRECTION **MAX 100 UNITS A DAY**, Disp: 60 mL, Rfl: 3    hydrocortisone 2.5 % cream, , Disp: , Rfl:     insulin degludec 200 unit/mL (3 mL) InPn, Inject 0.4 mL (80 Units total) under the skin daily. Titrate up to 100units daily as instructed. (Patient taking differently: Inject 0.325 mL (65 Units total) under the skin daily. Titrate up to 100units daily as instructed.), Disp: 45 mL, Rfl: 3    insulin glargine U-300 conc (TOUJEO SOLOSTAR) 300 unit/mL (1.5 mL) injection pen, Inject 70 Units under the skin daily., Disp: 24 mL, Rfl: 3    ketoconazole (NIZORAL) 2 % cream, , Disp: , Rfl:     LOKELMA 10 gram PwPk packet, DOSE: 1 PACKET (10 grams) every other day.  Empty entire contents of ONE packet into a glass with 45 mL of water. Stir well and drink immediately; if powder remains in the glass, add water, stir and drink immediately; repeat until no powder remains. (Patient taking differently: DOSE: 1 PACKET (10 grams) every other day.  Empty entire contents of ONE packet into a glass with 45 mL of water. Stir  well and drink immediately; if powder remains in the glass, add water, stir and drink immediately; repeat until no powder remains.), Disp: 11 packet, Rfl: 0    losartan (COZAAR) 25 MG tablet, TAKE 1 TABLET BY MOUTH DAILY., Disp: 90 tablet, Rfl: 3    magnesium chloride (SLOW_MAG) 64 mg TbEC, Take 128 mg by mouth three (3) times a day (at 6am, noon and 6pm). (2 tablets) 9am 1 pm and 9 pm, Disp: , Rfl:     metoPROLOL succinate (TOPROL-XL) 100 MG 24 hr tablet, TAKE ONE TABLET TWICE DAILY, Disp: 180 tablet, Rfl: 1    miscellaneous medical supply Misc, 1 application by Miscellaneous route daily as needed. Lightweight wheelchair., Disp: 1 each, Rfl: 0    miscellaneous medical supply Misc, 1 application by Miscellaneous route nightly. Recommend CPAP 14 cm H2O with EPR 3 and heated humidifier for nasal dryness, mask: ResMed Activa Lt nasal mask (Large)., Disp: 1 each, Rfl: 0    multivitamin capsule, Take 1 capsule by mouth daily., Disp: , Rfl:     MYFORTIC 180 mg EC tablet, Take 3 tablets (540 mg total) by mouth two (2) times a day., Disp: 540 tablet, Rfl: 3    PEN NEEDLE 32 gauge x 5/32 (4 mm) Ndle, USE 3 TIMES DAILY BEFORE MEALS, Disp: 300 each, Rfl: 3    pravastatin (PRAVACHOL) 40 MG tablet, Take 1 tablet (40 mg total) by mouth daily., Disp: 90 tablet, Rfl: 3    semaglutide (OZEMPIC) 0.25 mg or 0.5 mg (2 mg/3 mL) PnIj, Inject 0.5 mg under the skin once a week., Disp: 9 mL, Rfl: 3    tacrolimus (PROGRAF) 1 MG capsule, Take 3 capsules (3 mg total) by mouth in the morning AND 2 capsules (2 mg total) in the evening., Disp: 450 capsule, Rfl: 3    TRULICITY 1.5 mg/0.5 mL PnIj, INJECT 0.5ML (1.5MG  TOTAL) UNDER THE SKIN EVERY 7 DAYS, Disp: 2 mL, Rfl: 11    warfarin (JANTOVEN) 5 MG tablet, TAKE 1 TABLET BY MOUTH DAILY WITH EVENING MEAL. TAKE 6 DAYS A WEEK (TUESDAY THROUGH SUNDAY) AS DIRECTED., Disp: 90 tablet, Rfl: 3    Facility-Administered Medications Ordered in Other Encounters:     bevacizumab, , , PRN (once a day), Melvyn Novas, MD, 2.5 mg at 01/28/17 0750    Allergies  Penicillin g; Egg derived; Enalapril; Epinephrine; Grass pollen-bermuda, standard; Levofloxacin; Lisinopril; Mepivacaine hcl; and Penicillins    Family History   Problem Relation Age of Onset    Kidney disease Mother     Diabetes Mother     Hypertension Mother     Kidney disease Father     Cancer Father     Ulcers Father     Kidney disease Maternal Grandmother     Diabetes Maternal Grandmother     Glaucoma Neg Hx Macular degeneration Neg Hx     Strabismus Neg Hx        Social History  Social History     Tobacco Use    Smoking status: Never     Passive exposure: Never    Smokeless tobacco: Never   Vaping Use    Vaping status: Never Used   Substance Use Topics    Alcohol use: No    Drug use: No       Physical Exam     VITAL SIGNS:    Vitals:    10/02/22 1416   BP: 127/65   Pulse: 62   Resp: 18   Temp: 36.8 ??  C (98.2 ??F)   TempSrc: Oral   SpO2: 98%         Constitutional: Alert and oriented. Well appearing and in no distress.  Eyes: Conjunctivae are normal.   ENT: No congestion. No epistaxis. Mucous membranes are moist without lesions/ulcerations. Posterior   oropharynx is patent.   Neck: Supple.  Full ROM without pain.  Hematological/Lymphatic/Immunilogical: No cervical lymphadenopathy.  Cardiovascular: Normal rate, regular rhythm.  No gallops, murmurs, or clicks.  Respiratory: Normal respiratory effort. Breath sounds are normal.  Gastrointestinal: Soft and nontender. BS active. No guarding or rigidity.  Musculoskeletal: Normal range of motion in all extremities. No LE edema.   Neurologic: Normal speech and language. Cranial nerves II-XII intact (PERRL, EOMI, normal sensation in V1, V2 and V3 distributions, symmetric smile and eyebrow raise, normal hearing, tongue protrudes midline and patient moves it to the L and R without difficulty, uvula midline, 5/5 strength with shoulder shrug).  5 out of 5 strength in finger grip, biceps, triceps, deltoids bilaterally.  5 out of 5 strength with hip flexion, knee flexion and extension, ankle dorsiflexion, and ankle plantar flexion.  Normal sensation in upper extremities and lower extremities bilaterally.  No pronator drift.  Normal coordination finger-nose testing and rapid alternating movements.  2+ reflexes and biceps and patella bilaterally.  Negative Romberg sign.  Steady gait. Able to walk on tiptoes and heels without difficulty.  Normal tandem gait.  Skin: Skin is warm, dry and intact. No rash noted.  Psychiatric: Mood and affect are normal. Speech and behavior are normal.            Quentin Angst, FNP  10/09/22 [phone]

## 2022-10-02 NOTE — Unmapped (Signed)
Currently has been waiting for an hour and a half waiting to hear what to do , and if order was in yet.  Please advise.

## 2022-10-02 NOTE — Unmapped (Signed)
Pt states that he had blood work done earlier today and his INR was elevated- pt was told to come back and have it done again- pt had lab redrawn and it was still over five and was told to come over here and have a vitamin K infusion done

## 2022-10-03 LAB — TACROLIMUS LEVEL, TROUGH: TACROLIMUS, TROUGH: 4.2 ng/mL — ABNORMAL LOW (ref 5.0–15.0)

## 2022-10-03 LAB — CBC
HEMATOCRIT: 39.7 % (ref 39.0–48.0)
HEMOGLOBIN: 13.3 g/dL (ref 12.9–16.5)
MEAN CORPUSCULAR HEMOGLOBIN CONC: 33.5 g/dL (ref 32.0–36.0)
MEAN CORPUSCULAR HEMOGLOBIN: 28.3 pg (ref 25.9–32.4)
MEAN CORPUSCULAR VOLUME: 84.5 fL (ref 77.6–95.7)
MEAN PLATELET VOLUME: 7.5 fL (ref 6.8–10.7)
PLATELET COUNT: 234 10*9/L (ref 150–450)
RED BLOOD CELL COUNT: 4.7 10*12/L (ref 4.26–5.60)
RED CELL DISTRIBUTION WIDTH: 14.9 % (ref 12.2–15.2)
WBC ADJUSTED: 7.1 10*9/L (ref 3.6–11.2)

## 2022-10-03 LAB — BASIC METABOLIC PANEL
ANION GAP: 3 mmol/L — ABNORMAL LOW (ref 5–14)
BLOOD UREA NITROGEN: 27 mg/dL — ABNORMAL HIGH (ref 9–23)
BUN / CREAT RATIO: 20
CALCIUM: 8.8 mg/dL (ref 8.7–10.4)
CHLORIDE: 112 mmol/L — ABNORMAL HIGH (ref 98–107)
CO2: 27.3 mmol/L (ref 20.0–31.0)
CREATININE: 1.32 mg/dL — ABNORMAL HIGH
EGFR CKD-EPI (2021) MALE: 56 mL/min/{1.73_m2} — ABNORMAL LOW (ref >=60)
GLUCOSE RANDOM: 109 mg/dL (ref 70–179)
POTASSIUM: 5.1 mmol/L — ABNORMAL HIGH (ref 3.4–4.8)
SODIUM: 142 mmol/L (ref 135–145)

## 2022-10-03 LAB — PROTIME-INR
INR: 8.5
PROTIME: 88.6 s (ref 9.9–12.6)

## 2022-10-03 MED ORDER — POLYETHYLENE GLYCOL 3350 17 GRAM ORAL POWDER PACKET
PACK | Freq: Every day | ORAL | 0 refills | 30 days | Status: CP | PRN
Start: 2022-10-03 — End: 2022-11-02

## 2022-10-03 MED ADMIN — gabapentin (NEURONTIN) capsule 300 mg: 300 mg | ORAL

## 2022-10-03 MED ADMIN — mycophenolate (MYFORTIC) EC tablet 540 mg: 540 mg | ORAL | @ 02:00:00

## 2022-10-03 MED ADMIN — tacrolimus (PROGRAF) capsule 2 mg: 2 mg | ORAL | @ 02:00:00

## 2022-10-03 MED ADMIN — gabapentin (NEURONTIN) capsule 300 mg: 300 mg | ORAL | @ 13:00:00 | Stop: 2022-10-03

## 2022-10-03 MED ADMIN — metoPROLOL succinate (Toprol-XL) 24 hr tablet 100 mg: 100 mg | ORAL

## 2022-10-03 MED ADMIN — mycophenolate (MYFORTIC) EC tablet 540 mg: 540 mg | ORAL | @ 13:00:00 | Stop: 2022-10-03

## 2022-10-03 MED ADMIN — insulin glargine (LANTUS) injection 50 Units: 50 [IU] | SUBCUTANEOUS | @ 16:00:00 | Stop: 2022-10-03

## 2022-10-03 MED ADMIN — empagliflozin (JARDIANCE) tablet 5 mg: 5 mg | ORAL | @ 16:00:00 | Stop: 2022-10-03

## 2022-10-03 MED ADMIN — tacrolimus (PROGRAF) capsule 3 mg: 3 mg | ORAL | @ 13:00:00 | Stop: 2022-10-03

## 2022-10-03 MED ADMIN — pravastatin (PRAVACHOL) tablet 40 mg: 40 mg | ORAL | @ 13:00:00 | Stop: 2022-10-03

## 2022-10-03 MED ADMIN — metoPROLOL succinate (Toprol-XL) 24 hr tablet 100 mg: 100 mg | ORAL | @ 13:00:00 | Stop: 2022-10-03

## 2022-10-03 MED ADMIN — insulin lispro (HumaLOG) injection 0-20 Units: 0-20 [IU] | SUBCUTANEOUS | @ 16:00:00 | Stop: 2022-10-03

## 2022-10-03 NOTE — Unmapped (Signed)
OCCUPATIONAL THERAPY  Evaluation (10/03/22 [id number], and [id number])    Patient Name:  David Ellis       Medical Record Number: [id number]   Date of Birth: 12-Feb-1946  Sex: Male      Post-Discharge Occupational Therapy Recommendations:   3x weekly     OT DME Recommendations: None -              OT Treatment Diagnosis:  eval for eval and treat    Assessment  Problem List: Decreased strength, Decreased coordination, Decreased activity tolerance, Impaired balance, Decreased endurance, Fall risk, Impaired ADLs  Personal Factors/Comorbidities (Occupational Profile and History Review): Expanded (Moderate)  Assessment of Occupational Performance : Balance, Endurance, Mobility, Sensation, Strength  Clinical Decision Making: Moderate Complexity  Assessment: David Ellis is a 77 y.o. male whose presentation is complicated by history of atrial flutter, diabetes, diabetic retinopathy, neuropathy, pulmonary embolism, stroke and renal transplant on tacrolimus who presents with elevated INR. OT eval spearated to 2 session 2/2 pt requesting to eat breakfast. Pt reports prior to admission he was grossly min A for dressing, bathing, and bed mobility. Pt reports at baseline he is independent with mobility using cane for household distances, and rollator for community mobility. Pt lives with wife who can assist PRN. Therapist educated pt on role of OT, OT POC, and safety during functional transfers. Pt is currently functioning below baseline, requiring min A / CGA for functional transfers, CGA for functional mobility, max A for LE dressing, and CGA/ supervision for toileting tasks. Pt attempted to stand for grooming tasks, however pt noted with decreased balance, therapist graded task down to complete in sitting with supervision. Pt demonstrates deficits including decreased endurance, decreased strength, and decreased balance decreasing independence. Skilled acute care OT recommended to address these deficits and maximize independence. Recommend 3x with family assist at DC.     After review of the patient's occupational profile and history, assessment of occupational performance, clinical decision making, and development of POC, the patient presents as a moderate complexity case. Based on the daily activity AM-PAC raw score of 18/24, the patient is considered to be 46.6% impaired with self care.    Today's Interventions: ADL retraining, Balance activities, Compensatory tech. training, Conservation, Education - Patient, Endurance activities, Functional mobility, Positioning, Transfer training  Today's Interventions: endurance training, strength training, balance training, functional transfers, functional mobility, pt education, LE dressing, UE dressing, seated grooming tasks    Activity Tolerance During Today's Session  Tolerated treatment well    Plan  Planned Frequency of Treatment:  1-2x per day for: 3-4x week       Planned Interventions:  Education (Patient/Family/Caregiver), Self-Care/Home Training, Therapeutic Exercise, Therapeutic Activity      GOALS:   Patient and Family Goals: return home    Short Term:  SHORT GOAL #1: Pt will complete toilet transfer and toileting tasks with MOD I using LRD   Time Frame : 1 week  SHORT GOAL #2: Pt will complete standing grooming task x5 mins using LRD and MOD I   Time Frame : 1 week  SHORT GOAL #3: Pt will complete full body ADL with min A using LRD   Time Frame : 1 week                   Long Term Goal #1: Pt will maximize independence with self care tasks in 8 weeks  Time Frame: 4 weeks    Prognosis:  Good  Positive Indicators:  PLOF  Barriers to Discharge: Impaired Balance    Subjective  Medical Updates Since Last Visit: eval  Prior Functional Status pt reports prior to admission he is grossly min A for dressing, bathing, and bed mobility. independent with mobility using cane in the home, rollator for community. no recent falls in 6 months            Patient / Caregiver reports: I usually need help getting out of bed Pt agreeable to OT session    Past Medical History:   Diagnosis Date    Arthritis     Atrial flutter (CMS-HCC)     Cataract     Diabetes mellitus (CMS-HCC)     Diabetic nephropathy (CMS-HCC)     Diabetic retinopathy (CMS-HCC)     Fractures     Ganglion cyst     Hand injury     Heart disease     Hyperlipidemia     Hypertension     Impaired mobility     Joint pain     Lack of access to transportation     Osteomyelitis (CMS-HCC) 11/2014    Pulmonary embolism (CMS-HCC) 05/2014    Retinopathy due to secondary diabetes (CMS-HCC) 01/2013    Squamous cell skin cancer 11/2013    Stroke (CMS-HCC)     Tear of meniscus of knee     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    PR AMPUTATION METATARSAL+TOE,SINGLE Left 07/06/2014    Procedure: AMPUTATION, METATARSAL, WITH TOE SINGLE;  Surgeon: Marion Downer, MD;  Location: MAIN OR [facility];  Service: Vascular    PR AMPUTATION METATARSAL+TOE,SINGLE Left 10/28/2014    Procedure: AMPUTATION, METATARSAL, WITH TOE SINGLE;  Surgeon: Maple Mirza, MD;  Location: MAIN OR [place];  Service: Vascular    PR COLONOSCOPY FLX DX W/COLLJ SPEC WHEN PFRMD N/A 02/27/2022    Procedure: COLONOSCOPY, FLEXIBLE, PROXIMAL TO SPLENIC FLEXURE; DIAGNOSTIC, W/WO COLLECTION SPECIMEN BY BRUSH OR WASH;  Surgeon: Luanne Bras, MD;  Location[facility];  Service: Gastroenterology    PR COLONOSCOPY W/BIOPSY SINGLE/MULTIPLE  02/27/2022    Procedure: COLONOSCOPY, FLEXIBLE, PROXIMAL TO SPLENIC FLEXURE; WITH BIOPSY, SINGLE OR MULTIPLE;  Surgeon: Luanne Bras, MD;  Location: [facility];  Service: Gastroenterology    PR COLSC FLX W/RMVL OF TUMOR POLYP LESION SNARE TQ Left 02/27/2022    Procedure: COLONOSCOPY FLEX; W/REMOV TUMOR/LES BY SNARE;  Surgeon: Luanne Bras, MD;  Location: [facility];  Service: Gastroenterology    PR VITRECTOMY,PANRETINAL LASER RX Right 11/26/2016    Procedure: VITRECTOMY, MECHANICAL, PARS PLANA APPROACH; WITH ENDOLASER PANRETINAL PHOTOCOAGULATION;  Surgeon: Melvyn Novas, MD;  [facility];  Service: Ophthalmology    PR Hessie Knows LASER RX Left 01/28/2017    Procedure: VITRECTOMY, MECHANICAL, PARS PLANA APPROACH; WITH ENDOLASER PANRETINAL PHOTOCOAGULATION;  Surgeon: Melvyn Novas, MD;  Location: [facility] OR [facility];  Service: Ophthalmology    SKIN BIOPSY      VASECTOMY      Family History   Problem Relation Age of  Onset    Kidney disease Mother     Diabetes Mother     Hypertension Mother     Kidney disease Father     Cancer Father     Ulcers Father     Kidney disease Maternal Grandmother     Diabetes Maternal Grandmother     Glaucoma Neg Hx     Macular degeneration Neg Hx     Strabismus Neg Hx         Penicillin g; Egg derived; Enalapril; Epinephrine; Grass pollen-bermuda, standard; Levofloxacin; Lisinopril; Mepivacaine hcl; and Penicillins     Objective Findings  Precautions / Restrictions  Falls precautions       Weight Bearing  Non-applicable    Required Braces or Orthoses  Non-applicable    Communication Preference  Verbal    Pain  pt reported no pain    Equipment / Environment  Vascular access (PIV, TLC, Port-a-cath, PICC)    Living Situation  Living Environment: House  Lives With: Spouse  Home Living: Two level home, Able to Live on main level with bedroom/bathroom, Stairs to enter with rails, Walk-in shower, Shower chair with back  Caregiver Identified?: Yes  Caregiver Availability: 24 hours  Caregiver Ability: Research officer, trade union available at home: Rollator, Agricultural consultant, Paediatric nurse with back     Cognition   Orientation Level:  Oriented x 4   Arousal/Alertness:  Appropriate responses to stimuli   Attention Span:  Appears intact   Memory:  Appears intact   Following Commands:  Follows all commands and directions without difficulty   Safety Judgment:  Good awareness of safety precautions   Awareness of Errors and Problem Solving:  Patient self-corrected errors   Comments: pt appears slow to response, requires verbal cues for attention to task    Vision / Hearing   Vision: No acute deficits identified, Wears glasses all the time, Glasses present     Hearing: No deficit identified         Hand Function:  Right Hand Function: Right hand grip strength, ROM and coordination WNL  Left Hand Function: Left hand grip strength, ROM and coordination WNL  Hand Function comments: reports hands are  always cold    Skin Inspection:  Skin Inspection: Intact where visualized  Skin Inspection comment: mild redness on side of face    ROM / Strength:  UE ROM/Strength: Left Impaired/Limited, Right Impaired/Limited  RUE Impairment: Reduced strength  LUE Impairment: Reduced strength  LE ROM/Strength: Left Impaired/Limited, Right Impaired/Limited  RLE Impairment: Reduced strength  LLE Impairment: Reduced strength    Coordination:  Coordination: Impaired, Decreased accuracy, Decreased speed    Sensation:  RUE Sensation: RUE intact  LUE Sensation: LUE intact  RLE Sensation: RLE impaired  RLE Sensation Impairment: chronic peripheral neuropathy  LLE Sensation: LLE impaired  LLE Sensation Impairment: chronic peripheral neuropathy    Balance:  Static Sitting-Level of Assistance: Stand by Camera operator of Assistance: Stand by Surveyor, mining of Assistance: Contact guard  Dynamic Standing - Level of Assistance: Contact guard  Standing Balance comments: reports feeling unbalanced with static standing, CGA provided    Functional Mobility  Transfers: Min assist  Bed Mobility - Needs Assistance: Min assist  Ambulation: functional mobility x10 feet using RW and CGA, pt noted with shuffling gait, slow pace    ADLs  ADLs: Needs assistance with ADLs  ADLs - Needs Assistance: Grooming, Toileting, UB dressing, LB dressing  Grooming - Needs Assistance: Supervision, Performed seated  Toileting - Needs Assistance: Contact Guard assist  UB Dressing - Needs Assistance: Set Up Assist, Performed seated  LB Dressing - Needs Assistance: Total Assist  AM-PAC-Daily Activity  Lower Body Dressing assistance needs: A lot - Maximum/Moderate Assistance  Bathing assistance needs: A Little - Minimal/Contact Guard Assist/Supervision  Toileting assistance needs: A Little - Minimal/Contact Guard Assist/Supervision  Upper Body Dressing assistance needs: A Little - Minimal/Contact Guard Assist/Supervision  Personal Grooming assistance needs: A Little - Minimal/Contact Guard Assist/Supervision  Eating Meals assistance needs: None - Modified Independent/Independent    Daily Activity Score:  Daily Activity Score: 18    Score (in points): % of Functional Impairment, Limitation, Restriction  6: 100% impaired, limited, restricted  7-8: At least 80%, but less than 100% impaired, limited restricted  9-13: At least 60%, but less than 80% impaired, limited restricted  14-19: At least 40%, but less than 60% impaired, limited restricted  20-22: At least 20%, but less than 40% impaired, limited restricted  23: At least 1%, but less than 20% impaired, limited restricted  24: 0% impaired, limited restricted    Vitals / Orthostatics  Vitals/Orthostatics: NAD    Medical Staff Made Aware: RN aware    Patient at end of session: All needs in reach, In chair, Nurse notified     Occupational Therapy Session Duration  OT Individual [mins]: 46         I attest that I have reviewed the above information.  Signed: Girard Cooter, OT  Filed 10/03/2022

## 2022-10-03 NOTE — Unmapped (Addendum)
David Ellis is a 77 y.o. male whose presentation is complicated by history of atrial flutter, diabetes, diabetic retinopathy, neuropathy, pulmonary embolism, stroke and renal transplant on tacrolimus who presents with elevated INR. He was found to have LUQ abdominal mass in the area of the pancreatic tail concerning for pancreatic neoplasm. Oncology was consulted and patient was established with the undiagnosed cancer clinic for an expedited cancer workup. Further description of hospital course as below:    New LUQ Abdominal Mass concerning for pancreatic malignancy - LLQ Abdominal Pain - Constipation - Decreased Appetite / Weight Loss  Given weight loss and abdominal pain present for 3 weeks, CT AP obtained in ED. 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 Reassuringly pt w/o evidence of pancreatitis or hepatic dysfunction on initial labs. 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 Also, fecaliths noted on CT and possible that abdominal pain 2/2 this which improved with bowel movements. Oncology was consulted with referral made to the undiagnosed cancer clinic for expedited cancer workup.     Elevated INR  INR up to 9.96 on admission. Thought to be due to a supra therapeutic coumadin dose iso decreased PO intake, no other significant abnormalities on initial labs. No signs or symptoms of active bleeding. Held warfarin and trended PT-INR with improvement of INR to 8.50 on the day of discharge. Did not do any Vitamin K given improvement on its own. Held Warfarin throughout hospitalization with repeat INR check arranged on 5/3.     ESRD s/p Kidney Transplant  Pt follows w/Dr. Arvin Collard, baseline Cr 1.2 and Cr stable at baseline on admission. Will continue home immunosuppressant regimen, notify Dr. Arvin Collard of admission in AM. No infectious signs or symptoms on admission, no tenderness of transplanted kidney or signs of inflammation on CT. Consider nephrology c/s for risk/benefit discussion abdominal MRI as above. Continued home myfortic 540 mg BID and home tacrolimus 3 mg q morning, 2 mg q evening with daily tac trough.

## 2022-10-03 NOTE — Unmapped (Signed)
New admit to 4BT at 1826. Dr's in with patient currently.

## 2022-10-03 NOTE — Unmapped (Signed)
Care Management  Initial Transition Planning Assessment  Type of Residence: Mailing Address:  [address]  [place] [address]  Contacts: Accompanied by: Family member  Patient Phone Number: [phone] (home)           Medical Provider(s): Min, Dorann Ou, MD  Reason for Admission: Admitting Diagnosis:  Supratherapeutic INR [R79.1]  Past Medical History:   has a past medical history of Arthritis, Atrial flutter (CMS-HCC), Cataract, Diabetes mellitus (CMS-HCC), Diabetic nephropathy (CMS-HCC), Diabetic retinopathy (CMS-HCC), Fractures, Ganglion cyst, Hand injury, Heart disease, Hyperlipidemia, Hypertension, Impaired mobility, Joint pain, Lack of access to transportation, Osteomyelitis (CMS-HCC) (11/2014), Pulmonary embolism (CMS-HCC) (05/2014), Retinopathy due to secondary diabetes (CMS-HCC) (01/2013), Squamous cell skin cancer (11/2013), Stroke (CMS-HCC), Tear of meniscus of knee, and Transplanted kidney (08/21/2011).  Past Surgical History:   has a past surgical history that includes Nephrectomy transplanted organ (Right); pr amputation metatarsal+toe,single (Left, 07/06/2014); pr amputation metatarsal+toe,single (Left, 10/28/2014); Skin biopsy; pr vitrectomy,panretinal laser rx (Right, 11/26/2016); pr vitrectomy,panretinal laser rx (Left, 01/28/2017); Eye surgery; Vasectomy; pr colonoscopy flx dx w/collj spec when pfrmd (N/A, 02/27/2022); pr colonoscopy w/biopsy single/multiple (02/27/2022); and pr colsc flx w/rmvl of tumor polyp lesion snare tq (Left, 02/27/2022).   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  [id number] - [place], [place] - [address]  [facility] - [place] - [address]  [facility] [address], [place] - [address]    Transportation home:  Friend              General  Care Manager assessed the patient by : Telephone conversation with family (Wife)  Orientation Level: Oriented X4  Functional level prior to admission: Independent  Reason for referral: Discharge Planning    Contact/Decision Maker  Extended Emergency Contact Information  Primary Emergency Contact: Laverne,Cheryl  Address: [address]           [place], [place] [address] [place]  Mobile Phone: [phone]  Relation: Spouse    Legal Next of Kin / Guardian / POA / Advance Directives     HCDM (HCPOA): Banghart,Cheryl - Spouse - [phone]    Advance Directive (Medical Treatment)  Does patient have an advance directive covering medical treatment?: Patient has advance directive covering medical treatment, copy in chart.    Health Care Decision Maker [HCDM] (Medical & Mental Health Treatment)  Healthcare Decision Maker: HCDM documented in the HCDM/Contact Info section.  Information offered on HCDM, Medical & Mental Health advance directives:: Patient given information.  Referral Made: No         Readmission Information    Have you been hospitalized in the last 30 days?: No   Did the following happen with your discharge?  Patient Information  Lives with: Spouse/significant other    Type of Residence: Private residence        Location/Detail: [address] [place] [address]    Support Systems/Concerns: Spouse    Responsibilities/Dependents at home?: No    Home Care services in place prior to admission?: No                  Equipment Currently Used at Home: cane, straight, other (see comments) (Rollator, C-Pap)  Current HME Agency (Name/Phone #): [facility]    Currently receiving outpatient dialysis?: No       Financial Information       Need for financial assistance?: No       Social Determinants of Health  Social Determinants of Health     Financial Resource Strain: Low Risk  (10/03/2022)    Overall Financial Resource Strain (CARDIA)     Difficulty of Paying Living Expenses: Not hard at all   Internet Connectivity: Not on file   Food Insecurity: No Food Insecurity (07/19/2020)    Hunger Vital Sign     Worried About Running Out of Food in the Last Year: Never true     Ran Out of Food in the Last Year: Never true   Tobacco Use: Low Risk  (09/17/2022)    Patient History     Smoking Tobacco Use: Never     Smokeless Tobacco Use: Never     Passive Exposure: Never   Housing/Utilities: Low Risk  (07/19/2020)    Housing/Utilities     Within the past 12 months, have you ever stayed: outside, in a car, in a tent, in an overnight shelter, or temporarily in someone else's home (i.e. couch-surfing)?: No     Are you worried about losing your housing?: No     Within the past 12 months, have you been unable to get utilities (heat, electricity) when it was really needed?: No   Alcohol Use: Not At Risk (07/19/2020)    Alcohol Use     How often do you have a drink containing alcohol?: Never     How many drinks containing alcohol do you have on a typical day when you are drinking?: 1 - 2     How often do you have 5 or more drinks on one occasion?: Never   Transportation Needs: No Transportation Needs (10/03/2022)    PRAPARE - Transportation     Lack of Transportation (Medical): No     Lack of Transportation (Non-Medical): No   Substance Use: Low Risk  (07/19/2020)    Substance Use     Taken prescription drugs for non-medical reasons: Never     Taken illegal drugs: Never     Patient indicated they have taken drugs in the past year for non-medical reasons: Yes, [positive answer(s)]: Not on file   Health Literacy: Low Risk  (07/19/2020)    Health Literacy     : Never   Physical Activity: Sufficiently Active (07/19/2020)    Exercise Vital Sign     Days of Exercise per Week: 5 days     Minutes of Exercise per Session: 30 min   Interpersonal Safety: Not on file   Stress: No Stress Concern Present (07/19/2020)    [employer] of Occupational Health - Occupational Stress Questionnaire     Feeling of Stress : Only a little   Intimate Partner Violence: Not At Risk (07/19/2020)    Humiliation, Afraid, Rape, and Kick questionnaire  Fear of Current or Ex-Partner: No     Emotionally Abused: No     Physically Abused: No     Sexually Abused: No   Depression: At risk (08/21/2021)    PHQ-2     PHQ-2 Score: 4   Social Connections: Moderately Integrated (07/19/2020)    Social Connection and Isolation Panel [NHANES]     Frequency of Communication with Friends and Family: More than three times a week     Frequency of Social Gatherings with Friends and Family: Once a week     Attends Religious Services: 1 to 4 times per year     Active Member of [employer] or Organizations: No     Attends Engineer, structural: Never     Marital Status: Married       Complex Discharge Information    Is patient identified as a difficult/complex discharge?: No    Interventions:       Discharge Needs Assessment  Concerns to be Addressed: no discharge needs identified    Clinical Risk Factors: > 65    Barriers to taking medications: No    Prior overnight hospital stay or ED visit in last 90 days: No              Anticipated Changes Related to Illness: none    Equipment Needed After Discharge: other (see comments) (TBD)    Discharge Facility/Level of Care Needs:      Readmission  Risk of Unplanned Readmission Score:  %  Predictive Model Details   No score data available for [facility] Risk of Unplanned Readmission     Readmitted Within the Last 30 Days? (No if blank)   Patient at risk for readmission?: No    Discharge Plan  Screen findings are: Care Manager reviewed the plan of the patient's care with the Multidisciplinary Team. No discharge planning needs identified at this time. Care Manager will continue to manage plan and monitor patient's progress with the team.    Expected Discharge Date: 10/03/2022    Expected Transfer from Critical Care:         Patient and/or family were provided with choice of facilities / services that are available and appropriate to meet post hospital care needs?: N/A       Initial Assessment complete?: Yes

## 2022-10-03 NOTE — Unmapped (Signed)
Patient resting in bed, CPAP on. No signs of pain or distress at this time. Will continue to monitor.   Problem: Adult Inpatient Plan of Care  Goal: Plan of Care Review  Outcome: Progressing  Goal: Patient-Specific Goal (Individualized)  Outcome: Progressing  Goal: Absence of Hospital-Acquired Illness or Injury  Outcome: Progressing  Intervention: Identify and Manage Fall Risk  Recent Flowsheet Documentation  Taken 10/02/2022 2000 by Anders Simmonds, RN  Safety Interventions:   low bed   lighting adjusted for tasks/safety  Intervention: Prevent Skin Injury  Recent Flowsheet Documentation  Taken 10/02/2022 2000 by Anders Simmonds, RN  Positioning for Skin: Supine/Back  Goal: Optimal Comfort and Wellbeing  Outcome: Progressing  Goal: Readiness for Transition of Care  Outcome: Progressing  Goal: Rounds/Family Conference  Outcome: Progressing     Problem: Fall Injury Risk  Goal: Absence of Fall and Fall-Related Injury  Outcome: Progressing  Intervention: Promote Injury-Free Environment  Recent Flowsheet Documentation  Taken 10/02/2022 2000 by Anders Simmonds, RN  Safety Interventions:   low bed   lighting adjusted for tasks/safety     Problem: Self-Care Deficit  Goal: Improved Ability to Complete Activities of Daily Living  Outcome: Progressing

## 2022-10-03 NOTE — Unmapped (Signed)
Warfarin Therapeutic Monitoring Pharmacy Note    David Ellis is a 77 y.o. male continuing warfarin.     Indication: atrial fibrillation/atrial flutter    Prior Dosing Information: Home regimen: warfarin 5 mg po daily (last dose 5 mg at 9 PM on 10/01/22)       Source(s) of information used to determine prior to admission dosing: Patient/Caregiver/Office visit (internal medicine) on 04/16/22     Goals:  Therapeutic Drug Levels  INR range: 2-3    Additional Clinical Monitoring/Outcomes  Monitor hemoglobin and platelets  Monitor for signs and symptoms of bleeding  Monitor liver function (LFTs, bilirubin)    Results:  Lab Results   Component Value Date    INR 9.96 10/02/2022    INR 9.89 10/02/2022    INR 8.86 10/02/2022     Pharmacokinetic Considerations and Significant Drug Interactions:   Drug Interactions  not applicable    Bridge Therapy  None required    Concurrent Antiplatelet Medications  not applicable    Assessment/Plan:  Recommendation(s)  INR is supratherapeutic.  Hold dose tonight and warfarin dose by INR    Follow-up  Next INR to be obtained: daily with AM labs    A pharmacist will continue to monitor and recommend INRs/dose changes as appropriate    Please page service pharmacist with questions/clarifications.    Phillips Climes, RPH

## 2022-10-03 NOTE — Unmapped (Addendum)
Warfarin Therapeutic Monitoring Pharmacy Note    David Ellis is a 77 y.o. male continuing warfarin.     Indication:  History of DVT    Prior Dosing Information: Home regimen: 5mg  once daily       Source(s) of information used to determine prior to admission dosing: Clinic Note    Goals:  Therapeutic Drug Levels  INR range: 2-3    Additional Clinical Monitoring/Outcomes  Monitor hemoglobin and platelets  Monitor for signs and symptoms of bleeding  Monitor liver function (LFTs, bilirubin)    Results:  Lab Results   Component Value Date    INR 8.50 10/03/2022    INR 9.96 10/02/2022    INR 9.89 10/02/2022       Pharmacokinetic Considerations and Significant Drug Interactions:   Drug Interactions  not applicable    Bridge Therapy  None required    Concurrent Antiplatelet Medications  not applicable    Assessment/Plan:  Recommendation(s)  INR is supratherapeutic.  Hold waffarin  If patient discharges, patient should continue to hold warfarin and have INR checked on 10/05/22.   Consider restart at 50% of previous dose when INR is back in Range     Follow-up  Next INR to be obtained: daily with AM labs    A pharmacist will continue to monitor and recommend INRs/dose changes as appropriate    Please page service pharmacist with questions/clarifications.    Truddie Coco, Pharm D, BCPS, BCGP

## 2022-10-03 NOTE — Unmapped (Signed)
Pharmacist Discharge Note  Patient Name: David Ellis  Reason for admission: Elevated INR  Reason for writing this note: high risk medication    Highlighted medication changes with rationale (if applicable):  - Hold warfarin.  Recheck INR on Friday 5/3    Medication access:  - No barriers identified    Outpatient follow-up:  [ ]  INR on 5/3.  Consider restarting warfarin with a 50% reduction when INR is back in therapeutic range.  Monitor INR more frequently until warfarin dose is stable again.     Ronnald Ramp, BCPS, BCGP      Future Appointments   Date Time Provider Department Center   10/08/2022 11:00 AM Min, Dorann Ou, MD [facility]   10/15/2022 10:40 AM Min, Dorann Ou, MD [facility]   11/13/2022 11:30 AM Mottl, Odis Hollingshead, MD [facility]   01/24/2023  9:20 AM Yehuda Budd, MD [facility]   02/14/2023  9:30 AM Melvyn Novas, MD [facility]   04/09/2023  9:20 AM Donzetta Kohut, MD [facility]

## 2022-10-03 NOTE — Unmapped (Incomplete Revision)
Geriatrics ([facility]) History & Physical    Assessment & Plan:   David Ellis is a 77 y.o. male whose presentation is complicated by history of atrial flutter, diabetes, diabetic retinopathy, neuropathy, pulmonary embolism, stroke and renal transplant on tacrolimus who presents with elevated INR.     Chief Problem  Elevated INR  Active Problems:    * Diabetic Neuropathy       Left Sided - Abdominal Discomfort      Weight Loss - Decreased Appetite      Constipation      ESRD s/p Kidey Transplant*  Chronic Problems     T2DM     HTN     Diabetic Retinopathy     Afib     Hyperlipidemia      Active Problems    Elevated INR  Likely due to a supra therapeutic coumadin dose iso decreased PO intake, no other significant abnormalities on initial labs. No signs or symptoms of active bleeding. Hold warfarin and trend PT-INR, consider vitamin K reversal if active bleeding or INR >10.   -hold warfarin  -daily PT-INR  -appreciate pharmacy assistance  -monitor for signs of active bleeding  -type and screen active    LLQ Abdominal Pain - Constipation - Decreased Appetite / Weight Loss  Given weight loss and abdominal pain present for 3 weeks, CT AP obtained in ED. 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 Reassuringly pt w/o evidence of pancreatitis or hepatic dysfunction on initial labs. 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 Also, fecaliths noted on CT and possible that abdominal pain 2/2 this rather than mass, will trial miralax and encourage fluids.  -Consider Oncology, Nephrology c/s in AM to discuss workup of pancreatic mass  -miralax daily    ESRD s/p Kidney Transplant  Pt follows w/Dr. Mottl, baseline Cr 1.2 and Cr stable at baseline on admission. Will continue home immunosuppressant regimen, notify Dr. Mottl of admission in AM. No infectious signs or symptoms on admission, no tenderness of transplanted kidney or signs of inflammation on CT. Consider nephrology c/s for risk/benefit discussion abdominal MRI as above.  -continue home myfortic 540 mg BID  -continue home tacrolimus 3 mg q morning, 2 mg q evening  -daily BMP  -daily tac trough    4Ms     Mentation CAM: CAM: Negative  CAM-S: not yet  6CIT: Not yet completed (please touch base with nursing)  Delirium Order Set: Ordered (appropriate for any patient >65)   Mobility Baseline functional status: Independent in ADLs  Current functional status: Needs Assistance with ADLs  PT/OT Ordered: Yes   Medications Reviewed home medications, screening for potentially inappropriate medications: Yes  Medications recommended de-prescribing: none   What Matters Geri Assessment complete: No, needs to be done       Chronic Problems    T2DM c/b Diabetic Retinopathy: dose-reduce home lantus from 60 to 50 units 2/2 decreased PO intake, SSI, plan to add back nutritional insulin 5/1  HTN: hold home losartan iso hyperkalemia, held outpt  Afib: s/p ablation  Hyperlipidemia: continue home pravastatin      The patient's presentation is complicated by the following clinically significant conditions requiring additional evaluation and treatment: - Chronic kidney disease POA requiring further investigation, treatment, or monitoring   - Hyperkalemia POA requiring further investigation, treatment, or monitoring           Issues Impacting Complexity of Management:  -The patient is at high risk of complications from elevated INR      Checklist:  Diet: Regular Diet  DVT PPx: Patient Already on Full Anticoagulation with warfarin  Code Status: Prior  Dispo: Patient appropriate for Observation based on expectation of ongoing need for hospitalization less than two midnights and/or low intensity of services provided    Team Contact Information:   Primary Team: Geriatrics ([facility])  Primary Resident: Nina  Wallace, MS3; Leianna Barga, MD  Resident's Pager: [phone] (Geriatrics Senior Resident)    Chief Concern:   No Principal Problem: There is no principal problem currently on the Problem List. Please update the Problem List and refresh.      Subjective:   David Ellis is a 77 y.o. male with pertinent PMHx of atrial flutter, diabetes, diabetic retinopathy, neuropathy, pulmonary embolism, stroke and renal transplant on tacrolimus who presents with elevated INR.         History obtained by patient.       HPI:  Patient presented to the ED by recommendation of his PCP after multiple elevated INRs since seeing his PCP. His INR was elevated at 8.86 this morning, he was sent to the hospital for recheck INR that remained elevated at 9.89. He has been taken Coumadin 5mg nightly and his last INR in February was 2.37. He denies any recent bleeding from nose, gums, superficial wounds, urine, or stools. He was advised to stay in the ED for a vitamin K infusion. He denies any SOB, chest pain, edema, fever, or recent illness.    He reports left sided abdominal pain for the last few weeks. The pain is described as pressure-like, and is low in intensity. Onset was 3 weeks ago. Symptoms have been unchanged  since with some improvement over the past few days. Aggravating factors include fatty foods and excessive eating.  Alleviating factors include eating smaller and plain meals. Associated symptoms include anorexia and constipation. The patient denies diarrhea, dysuria, fever, hematochezia, nausea, and vomiting.  He reports some black stools but attributes to eating beets frequently. He denies any radiating abdominal pain or history of gallstones. He notes that eating blander foods that aren't fatty help and he has been eating a more bland diet. This HPI was also endorsed by his wife who witnessed his decrease in appetite over the last month.       Pertinent Surgical Hx  Past Surgical History:   Procedure Laterality Date    EYE SURGERY      NEPHRECTOMY TRANSPLANTED ORGAN Right     LURD - spouse received in 2013.    PR AMPUTATION METATARSAL+TOE,SINGLE Left 07/06/2014    Procedure: AMPUTATION, METATARSAL, WITH TOE SINGLE;  Surgeon: Raghuveer Vallabhaneni, MD;  Location: MAIN OR [place];  Service: Vascular    PR AMPUTATION METATARSAL+TOE,SINGLE Left 10/28/2014    Procedure: AMPUTATION, METATARSAL, WITH TOE SINGLE;  Surgeon: Kaidon A Marston, MD;  Location: MAIN OR [place];  Service: Vascular    PR COLONOSCOPY FLX DX W/COLLJ SPEC WHEN PFRMD N/A 02/27/2022    Procedure: COLONOSCOPY, FLEXIBLE, PROXIMAL TO SPLENIC FLEXURE; DIAGNOSTIC, W/WO COLLECTION SPECIMEN BY BRUSH OR WASH;  Surgeon: Hansen, Jonathan James, MD;  Location: [facility] [place];  Service: Gastroenterology    PR COLONOSCOPY W/BIOPSY SINGLE/MULTIPLE  02/27/2022    Procedure: COLONOSCOPY, FLEXIBLE, PROXIMAL TO SPLENIC FLEXURE; WITH BIOPSY, SINGLE OR MULTIPLE;  Surgeon: Hansen, Jonathan James, MD;  Location: [facility] [place];  Service: Gastroenterology      PR COLSC FLX W/RMVL OF TUMOR POLYP LESION SNARE TQ Left 02/27/2022    Procedure: COLONOSCOPY FLEX; W/REMOV TUMOR/LES BY SNARE;  Surgeon: Hansen, Jonathan James, MD;  Location: [facility] [place];  Service: Gastroenterology    PR VITRECTOMY,PANRETINAL LASER RX Right 11/26/2016    Procedure: VITRECTOMY, MECHANICAL, PARS PLANA APPROACH; WITH ENDOLASER PANRETINAL PHOTOCOAGULATION;  Surgeon: Jan Niklas Ulrich, MD;  Location: [facility] [place];  Service: Ophthalmology    PR VITRECTOMY,PANRETINAL LASER RX Left 01/28/2017    Procedure: VITRECTOMY, MECHANICAL, PARS PLANA APPROACH; WITH ENDOLASER PANRETINAL PHOTOCOAGULATION;  Surgeon: Jan Niklas Ulrich, MD;  Location: [facility] [place];  Service: Ophthalmology    SKIN BIOPSY      VASECTOMY          Pertinent Family Hx  Per chart, no new pertinent family history.    Pertinent Social Hx   Per chart and HPI, no new social hx.   Social History     Tobacco Use Smoking Status Never    Passive exposure: Never   Smokeless Tobacco Never     Alcohol Use: Not At Risk (07/19/2020)    Alcohol Use     How often do you have a drink containing alcohol?: Never     How many drinks containing alcohol do you have on a typical day when you are drinking?: 1 - 2     How often do you have 5 or more drinks on one occasion?: Never      Social History     Social History Narrative    Works for the city of [place].    Retired from the [employer].      Married.      Two healthy children.          Allergies  Penicillin g; Egg derived; Enalapril; Epinephrine; Grass pollen-bermuda, standard; Levofloxacin; Lisinopril; Mepivacaine hcl; and Penicillins    I reviewed the Medication List. The current list is Accurate  Prior to Admission medications    Medication Dose, Route, Frequency   acetaminophen (TYLENOL) 325 MG tablet Oral   aspirin (ECOTRIN) 81 MG tablet 81 mg, Oral, Daily PRN   blood-glucose meter kit Use as instructed.   cholecalciferol, vitamin D3-50 mcg, 2,000 unit,, 50 mcg (2,000 unit) tablet 2,000 Units, Oral, Daily (standard)   dulaglutide (TRULICITY) 1.5 mg/0.5 mL PnIj 1.5 mg, Subcutaneous, Every 7 days   dulaglutide (TRULICITY) 1.5 mg/0.5 mL PnIj 1.5 mg, Subcutaneous, Weekly   empagliflozin (JARDIANCE) 10 mg tablet 10 mg, Oral, Every morning  Patient taking differently: Take 0.5 tablets (5 mg total) by mouth every morning.   flash glucose sensor (FREESTYLE LIBRE 14 DAY SENSOR) kit Other, Every 14 days   furosemide (LASIX) 40 MG tablet 40 mg, Oral, Daily (standard), 40 mg daily. And 1 additional tablet for edema.   gabapentin (NEURONTIN) 300 MG capsule TAKE 1 CAPSULE BY MOUTH TWICE DAILY AS DIRECTED   HUMALOG KWIKPEN INSULIN 100 unit/mL injection pen INJECT 15-25 UNITS WITH MEALS + CORRECTION **MAX 100 UNITS A DAY**   hydrocortisone 2.5 % cream    insulin degludec 200 unit/mL (3 mL) InPn 80 Units, Subcutaneous, Daily (standard), Titrate up to 100units daily as instructed.  Patient taking differently: Inject 0.325 mL (65 Units total) under the skin daily. Titrate up to 100units daily as instructed.   insulin glargine U-300 conc (TOUJEO SOLOSTAR) 300 unit/mL (1.5 mL) injection pen 70 Units, Subcutaneous, Daily (standard)   ketoconazole (NIZORAL) 2 % cream    LOKELMA 10 gram PwPk   packet DOSE: 1 PACKET (10 grams) every other day.  Empty entire contents of ONE packet into a glass with 45 mL of water. Stir well and drink immediately; if powder remains in the glass, add water, stir and drink immediately; repeat until no powder remains.  Patient taking differently: DOSE: 1 PACKET (10 grams) every other day.  Empty entire contents of ONE packet into a glass with 45 mL of water. Stir well and drink immediately; if powder remains in the glass, add water, stir and drink immediately; repeat until no powder remains.   losartan (COZAAR) 25 MG tablet 25 mg, Oral, Daily (standard)   magnesium chloride (SLOW_MAG) 64 mg TbEC 128 mg, Oral, 3 times a day, (2 tablets) 9am 1 pm and 9 pm   metoPROLOL succinate (TOPROL-XL) 100 MG 24 hr tablet TAKE ONE TABLET TWICE DAILY   miscellaneous medical supply Misc 1 application., Miscellaneous, Daily PRN, Lightweight wheelchair.   miscellaneous medical supply Misc 1 application., Miscellaneous, Nightly, Recommend CPAP 14 cm H2O with EPR 3 and heated humidifier for nasal dryness, mask: ResMed Activa Lt nasal mask (Large).   MYFORTIC 180 mg EC tablet 540 mg, Oral, 2 times a day   PEN NEEDLE 32 gauge x 5/32 (4 mm) Ndle USE 3 TIMES DAILY BEFORE MEALS   pravastatin (PRAVACHOL) 40 MG tablet 40 mg, Oral, Daily (standard)   semaglutide (OZEMPIC) 0.25 mg or 0.5 mg (2 mg/3 mL) PnIj 0.5 mg, Subcutaneous, Weekly   tacrolimus (PROGRAF) 1 MG capsule Take 3 capsules (3 mg total) by mouth in the morning AND 2 capsules (2 mg total) in the evening.   TRULICITY 1.5 mg/0.5 mL PnIj INJECT 0.5ML (1.5MG TOTAL) UNDER THE SKIN EVERY 7 DAYS   warfarin (JANTOVEN) 5 MG tablet TAKE 1 TABLET BY MOUTH DAILY WITH EVENING MEAL. TAKE 6 DAYS A WEEK (TUESDAY THROUGH SUNDAY) AS DIRECTED.   empagliflozin (JARDIANCE) 10 mg tablet 10 mg, Oral, Daily (standard)   multivitamin capsule 1 capsule, Oral, Daily (standard)       Designated Healthcare Decision Maker:  David Ellis currently has decisional capacity for healthcare decision-making and is able to designate a surrogate healthcare decision maker. David Ellis's designated healthcare decision maker(s) is/are David Ellis the patient's spouse) as denoted by stated patient preference.    Objective:   Physical Exam:  Temp:  [36.8 ??C (98.2 ??F)] 36.8 ??C (98.2 ??F)  Heart Rate:  [62-65] 65  SpO2 Pulse:  [64] 64  Resp:  [16-18] 16  BP: (126-127)/(65-111) 126/111  SpO2:  [97 %-98 %] 97 %    Gen: NAD, converses   Eyes: Sclera anicteric, EOMI grossly normal   HENT: Atraumatic, normocephalic  Neck: Trachea midline  Heart: RRR  Lungs: CTAB, no crackles or wheezes  Abdomen: Soft, NTND, no palpable organomegaly, NABS  Extremities: No edema  Neuro: Grossly symmetric, non-focal    Skin:  bilateral red discoloration noted lower extremities, multiple seborrhaic keratoses noted over abdomen  Psych: Alert, oriented     Grayce Budden Burris Benoit Meech, MD  Internal Medicine PGY-2

## 2022-10-03 NOTE — Unmapped (Signed)
Tacrolimus Therapeutic Monitoring Pharmacy Note    David Ellis is a 77 y.o. male continuing tacrolimus.     Indication: Kidney transplant     Date of Transplant:  08/21/2011       Prior Dosing Information: Home regimen tacrolimus 3 mg AM/2 mg PM      Source(s) of information used to determine prior to admission dosing:  Office Visit from 08/29/2022 in [facility] [place]    Goals:  Therapeutic Drug Levels  Tacrolimus trough goal:  4 - 7 ng/ml    Additional Clinical Monitoring/Outcomes  Monitor renal function (SCr and urine output) and liver function (LFTs)  Monitor for signs/symptoms of adverse events (e.g., hyperglycemia, hyperkalemia, hypomagnesemia, hypertension, headache, tremor)    Results:   Tacrolimus level:  5.0 at 09:11 on 10/02/22    Pharmacokinetic Considerations and Significant Drug Interactions:  Concurrent hepatotoxic medications: None identified  Concurrent CYP3A4 substrates/inhibitors: None identified  Concurrent nephrotoxic medications: None identified    Assessment/Plan:  Recommendedation(s)  Continue current regimen of tacrolimus 3 mg AM/2 mg PM    Follow-up  Next level to be determined by primary team.   A pharmacist will continue to monitor and recommend levels as appropriate    Please page service pharmacist with questions/clarifications.    Phillips Climes, RPH

## 2022-10-03 NOTE — Unmapped (Signed)
Physician Discharge Summary [facility]BR  4 BT1 HBR  [address]  [place] [address]  Dept: [phone]  Loc: [phone]     Identifying Information:   David Ellis  Sep 04, 1945  [id number]    Primary Care Physician: Sharlee Blew, MD     Code Status: Full Code    Admit Date: 10/02/2022    Discharge Date: 10/03/2022     Discharge To: Home    Discharge Service: [facility] - Geriatrics Floor Team (MED A Alvester Morin)     Discharge Attending Physician: Yolanda Bonine, MD    Discharge Diagnoses:   Principal Problem:    Supratherapeutic INR (POA: Yes)  Active Problems:    Essential (primary) hypertension (POA: Yes)    History of kidney transplant (POA: Not Applicable)    Mixed hyperlipidemia (POA: Yes)    Transplanted kidney (POA: Not Applicable)    Diverticulosis of colon (POA: Yes)    OSA on CPAP (POA: Yes)    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) (POA: Yes)    Immunosuppressed status (CMS-HCC) (POA: Yes)    Left upper quadrant abdominal mass (POA: Unknown)  Resolved Problems:    * No resolved hospital problems. [facility] Course:   CARTIER LLANES is a 77 y.o. male whose presentation is complicated by history of atrial flutter, diabetes, diabetic retinopathy, neuropathy, pulmonary embolism, stroke and renal transplant on tacrolimus who presents with elevated INR. He was found to have LUQ abdominal mass in the area of the pancreatic tail concerning for pancreatic neoplasm. Oncology was consulted and patient was established with the undiagnosed cancer clinic for an expedited cancer workup. Further description of hospital course as below:    New LUQ Abdominal Mass concerning for pancreatic malignancy - LLQ Abdominal Pain - Constipation - Decreased Appetite / Weight Loss  Given weight loss and abdominal pain present for 3 weeks, CT AP obtained in ED. 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 Reassuringly pt w/o evidence of pancreatitis or hepatic dysfunction on initial labs. 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 Also, fecaliths noted on CT and possible that abdominal pain 2/2 this which improved with bowel movements. Oncology was consulted with referral made to the undiagnosed cancer clinic for expedited cancer workup.     Elevated INR  INR up to 9.96 on admission. Thought to be due to a supra therapeutic coumadin dose iso decreased PO intake, no other significant abnormalities on initial labs. No signs or symptoms of active bleeding. Held warfarin and trended PT-INR with improvement of INR to 8.50 on the day of discharge. Did not do any Vitamin K given improvement on its own. Held Warfarin throughout hospitalization with repeat INR check arranged on 5/3.     ESRD s/p Kidney Transplant  Pt follows w/Dr. Arvin Collard, baseline Cr 1.2 and Cr stable at baseline on admission. Will continue home immunosuppressant regimen, notify Dr. Arvin Collard of admission in AM. No infectious signs or symptoms on admission, no tenderness of transplanted kidney or signs of inflammation on CT. Consider nephrology c/s for risk/benefit discussion abdominal MRI as above. Continued home myfortic 540 mg BID and home tacrolimus 3 mg q morning, 2 mg q evening with daily tac trough.    The patient's hospital stay has been complicated by the following clinically significant conditions requiring additional evaluation and  treatment or having a significant effect of this patient's care: - Medication-induced coagulopathy with bleeding POA requiring further treatment, investigation or monitoring     Outpatient Provider Follow Up Issues:   [ ]  New LUQ abdominal mass concerning for Pancreatic Malignancy  [ ]  INR checks, next check on 5/3  [ ]  PET/CT Scan  [ ]  CEA, CA 19-9    Touchbase with Outpatient Provider:  Warm Handoff: Completed on 10/03/22 by Clotilde Dieter, MD  (Intern) via Epic Secure Chat    Procedures:  None  ______________________________________________________________________  Discharge Medications:      Your Medication List        STOP taking these medications      insulin glargine U-300 conc 300 unit/mL (1.5 mL) injection pen  Commonly known as: TOUJEO SOLOSTAR     OZEMPIC 0.25 mg or 0.5 mg (2 mg/3 mL) Pnij  Generic drug: semaglutide     TRULICITY 1.5 mg/0.5 mL Pnij  Generic drug: dulaglutide     warfarin 5 MG tablet  Commonly known as: JANTOVEN            START taking these medications      polyethylene glycol 17 gram packet  Commonly known as: MIRALAX  Take 17 g by mouth daily as needed (constipation).            CONTINUE taking these medications      acetaminophen 325 MG tablet  Commonly known as: TYLENOL  Take by mouth.     aspirin 81 MG tablet  Commonly known as: ECOTRIN  Take 1 tablet (81 mg total) by mouth daily as needed (when flying).     blood-glucose meter kit  Use as instructed.     cholecalciferol (vitamin D3-50 mcg (2,000 unit)) 50 mcg (2,000 unit) tablet  Take 1 tablet (50 mcg total) by mouth daily.     empagliflozin 10 mg tablet  Commonly known as: JARDIANCE  Take 0.5 tablets (5 mg total) by mouth every morning.     FREESTYLE LIBRE 14 DAY SENSOR  by Other route every fourteen (14) days.     furosemide 40 MG tablet  Commonly known as: LASIX  Take 1 tablet (40 mg total) by mouth daily. 40 mg daily. And 1 additional tablet for edema.     gabapentin 300 MG capsule  Commonly known as: NEURONTIN  TAKE 1 CAPSULE BY MOUTH TWICE DAILY AS DIRECTED     HumaLOG KwikPen Insulin 100 unit/mL injection pen  Generic drug: insulin lispro  INJECT 15-25 UNITS WITH MEALS + CORRECTION **MAX 100 UNITS A DAY**     hydrocortisone 2.5 % cream     insulin degludec 200 unit/mL (3 mL) Inpn  Inject 0.4 mL (80 Units total) under the skin daily. Titrate up to 100units daily as instructed. ketoconazole 2 % cream  Commonly known as: NIZORAL     LOKELMA 10 gram Pwpk packet  Generic drug: sodium zirconium cyclosilicate  DOSE: 1 PACKET (10 grams) every other day.  Empty entire contents of ONE packet into a glass with 45 mL of water. Stir well and drink immediately; if powder remains in the glass, add water, stir and drink immediately; repeat until no powder remains.     losartan 25 MG tablet  Commonly known as: COZAAR  TAKE 1 TABLET BY MOUTH DAILY.     magnesium chloride 64 mg magnesium Tbec  Commonly known as: SLOW-MAG  Take 128 mg by mouth three (3) times a day (at 6am, noon and  6pm). (2 tablets) 9am 1 pm and 9 pm     metoPROLOL succinate 100 MG 24 hr tablet  Commonly known as: Toprol-XL  TAKE ONE TABLET TWICE DAILY     miscellaneous medical supply Misc  1 application by Miscellaneous route daily as needed. Lightweight wheelchair.     miscellaneous medical supply Misc  1 application by Miscellaneous route nightly. Recommend CPAP 14 cm H2O with EPR 3 and heated humidifier for nasal dryness, mask: ResMed Activa Lt nasal mask (Large).     multivitamin capsule  Take 1 capsule by mouth daily.     MYFORTIC 180 mg EC tablet  Generic drug: mycophenolate  Take 3 tablets (540 mg total) by mouth two (2) times a day.     PEN NEEDLE 32 gauge x 5/32 (4 mm) Ndle  Generic drug: pen needle, diabetic  USE 3 TIMES DAILY BEFORE MEALS     pravastatin 40 MG tablet  Commonly known as: PRAVACHOL  Take 1 tablet (40 mg total) by mouth daily.     tacrolimus 1 MG capsule  Commonly known as: PROGRAF  Take 3 capsules (3 mg total) by mouth in the morning AND 2 capsules (2 mg total) in the evening.              Allergies:  Penicillin g; Egg derived; Enalapril; Epinephrine; Grass pollen-bermuda, standard; Levofloxacin; Lisinopril; Mepivacaine hcl; and Penicillins  ______________________________________________________________________  Pending Test Results:  Pending Labs       Order Current Status    Tacrolimus Level, Trough In process            Most Recent Labs:  All lab results last 24 hours -   Recent Results (from the past 24 hour(s))   Hepatic function panel (LFT's)    Collection Time: 10/02/22  4:46 PM   Result Value Ref Range    Albumin 3.1 (L) 3.4 - 5.0 g/dL    Total Protein 6.1 5.7 - 8.2 g/dL    Total Bilirubin 0.5 0.3 - 1.2 mg/dL    Bilirubin, Direct 1.61 0.00 - 0.30 mg/dL    AST 22 <=09 U/L    ALT 11 10 - 49 U/L    Alkaline Phosphatase 80 46 - 116 U/L   Lipase    Collection Time: 10/02/22  4:46 PM   Result Value Ref Range    Lipase 28 12 - 53 U/L   PT-INR    Collection Time: 10/02/22  5:46 PM   Result Value Ref Range    PT 103.4 (HH) 9.9 - 12.6 sec    INR 9.96    POCT Glucose    Collection Time: 10/02/22  7:43 PM   Result Value Ref Range    Glucose, POC 115 70 - 179 mg/dL   Type and Screen    Collection Time: 10/03/22  7:17 AM   Result Value Ref Range    Blood Type A NEG     Antibody Screen NEG    Basic metabolic panel    Collection Time: 10/03/22  7:17 AM   Result Value Ref Range    Sodium 142 135 - 145 mmol/L    Potassium 5.1 (H) 3.4 - 4.8 mmol/L    Chloride 112 (H) 98 - 107 mmol/L    CO2 27.3 20.0 - 31.0 mmol/L    Anion Gap 3 (L) 5 - 14 mmol/L    BUN 27 (H) 9 - 23 mg/dL    Creatinine 6.04 (H) 0.73 - 1.18 mg/dL  BUN/Creatinine Ratio 20     eGFR CKD-EPI (2021) Male 56 (L) >=60 mL/min/1.43m2    Glucose 109 70 - 179 mg/dL    Calcium 8.8 8.7 - 16.1 mg/dL   CBC    Collection Time: 10/03/22  7:17 AM   Result Value Ref Range    WBC 7.1 3.6 - 11.2 10*9/L    RBC 4.70 4.26 - 5.60 10*12/L    HGB 13.3 12.9 - 16.5 g/dL    HCT 09.6 04.5 - 40.9 %    MCV 84.5 77.6 - 95.7 fL    MCH 28.3 25.9 - 32.4 pg    MCHC 33.5 32.0 - 36.0 g/dL    RDW 81.1 91.4 - 78.2 %    MPV 7.5 6.8 - 10.7 fL    Platelet 234 150 - 450 10*9/L   PT-INR    Collection Time: 10/03/22  7:17 AM   Result Value Ref Range    PT 88.6 (HH) 9.9 - 12.6 sec    INR 8.50    POCT Glucose    Collection Time: 10/03/22  9:04 AM   Result Value Ref Range    Glucose, POC 89 70 - 179 mg/dL POCT Glucose    Collection Time: 10/03/22 11:15 AM   Result Value Ref Range    Glucose, POC 228 (H) 70 - 179 mg/dL       Relevant Studies/Radiology:  CT abdomen pelvis without contrast    Result Date: 10/02/2022  EXAM: CT ABDOMEN PELVIS WO CONTRAST ACCESSION: [id number] UN CLINICAL INDICATION: 77 years old with L sided abd pain x 2 weeks, h/o kidney transplant no contrast  COMPARISON: None. TECHNIQUE: A spiral CT scan was obtained without IV contrast from the lung bases to the pubic symphysis.  Images were reconstructed in the axial plane. Coronal and sagittal reformatted images were also provided for further evaluation. Evaluation of the solid organs and vasculature is limited in the absence of intravenous contrast. FINDINGS: LOWER CHEST: Multiple solid nodules in the visualized lower lungs, for example middle lobe perifissural measuring 0.6 cm (2:10). Right pleural effusion. LIVER: Normal liver contour. No focal liver lesion on non-contrast examination. BILIARY: Cholelithiasis. No intrahepatic biliary ductal dilatation. SPLEEN: Normal in size and contour. PANCREAS: Mass in the area of the pancreatic tail measuring 5.3 x 3.7 x 3.7 cm (2:52, 4:70). The mass abuts the inferior border of the greater curvature of the stomach and the superior border of the proximal jejunum. Crescentic fat density is seen along the anterior portion of the mass (2:49), possibly within the mass or adjacent mesenteric fat. No gross ductal dilatation. ADRENAL GLANDS: Left adrenal nodule measuring 2.8 cm. The right adrenal gland is normal. KIDNEYS/URETERS: Atrophic native kidneys with subcentimeter lesions too small to characterize. Right lower quadrant renal transplant. No hydronephrosis. No nephrolithiasis. paracolic gutter, BLADDER: Unremarkable. REPRODUCTIVE ORGANS: Unremarkable. GI TRACT: No findings of bowel obstruction or acute inflammation. Normal appendix (2:94). PERITONEUM/RETROPERITONEUM AND MESENTERY: No free air. The inferior right paracolic gutter,. No fluid collection. VASCULATURE: Atherosclerotic calcifications of the abdominal aorta and branch vessels. Normal caliber aorta. Otherwise, limited evaluation without contrast. LYMPH NODES: Enlarged porta hepatis, periaortic, and aortocaval lymph nodes with a reference aortocaval node measuring 2.1 cm (2:66). BONES and SOFT TISSUES: No aggressive osseous lesions. No focal soft tissue lesions.     --Left upper quadrant abdominal mass measuring 5.3 x 3.7 x 3.7 cm. The mass is in the area of the pancreatic tail and a pancreatic neoplasm is favored. It abuts the adjacent stomach and  proximal jejunum and gastric/enteric neoplasm cannot be entirely excluded. Lack of contrast limits evaluation. Nonemergent MRI of the abdomen is recommended. --Abdominal lymphadenopathy and left adrenal nodule which is suspicious for metastasis. -- Partially visualized solid pulmonary nodules and right pleural effusion. This may be infectious or inflammatory or metastatic. -- Layering fluid in the right paracolic gutter, nonspecific   ______________________________________________________________________  Discharge Instructions:   Activity Instructions       Activity as tolerated                       Follow Up instructions and Outpatient Referrals     Ambulatory referral to Hematology / Oncology      Call MD for:  difficulty breathing, headache or visual disturbances      Call MD for:  persistent nausea or vomiting      Call MD for:  severe uncontrolled pain      Call MD for:  temperature >38.5 Celsius      Discharge instructions          Appointments which have been scheduled for you      Oct 08, 2022 11:00 AM  (Arrive by 10:45 AM)  RETURN CONTINUITY with Sharlee Blew, MD  [facility] INTERNAL MEDICINE [place] [facility] REGION) [address]  [address]  [place] [address]  [phone]        Oct 15, 2022 10:40 AM  (Arrive by 10:25 AM)  RETURN CONTINUITY with Sharlee Blew, MD  [facility] INTERNAL MEDICINE [place] [facility] REGION) [address]  [address]  [place] [address]  [phone]        Nov 13, 2022 11:30 AM  (Arrive by 11:15 AM)  RETURN NEPHROLOGY SPECIAL with Amy Vinnie Langton, MD  [facility] [place] [facility] REGION) [address]  [address]  [place] [address]  [phone]        Jan 24, 2023 9:20 AM  (Arrive by 9:10 AM)  RETURN CARDIOLOGY with Yehuda Budd, MD  [facility] INTERNAL MEDICINE CARDIOLOGY [facility]) [address] [place] [address]  [phone]        Feb 14, 2023 9:30 AM  RETURN  RETINA with Melvyn Novas, MD  [facility] OPHTHALMOLOGY [address] [facility] REGION) [address]  [place] [address]  [phone]        Apr 09, 2023 9:20 AM  (Arrive by 9:05 AM)  RETURN  DIABETES with Donzetta Kohut, MD  [facility] DIABETES AND ENDOCRINOLOGY [place] [facility] REGION) [address]  [place] [address]  [phone]             ______________________________________________________________________  Discharge Day Services:  BP 166/69  - Pulse 60  - Temp 36 ??C (96.8 ??F) (Temporal)  - Resp 19  - Ht 185.4 cm (6' 0.99)  - Wt (!) 107.5 kg (236 lb 14.4 oz)  - SpO2 97%  - BMI 31.26 kg/m??     Pt seen on the day of discharge and determined appropriate for discharge.    Condition at Discharge: stable    Length of Discharge: I spent greater than 30 mins in the discharge of this patient.    Clotilde Dieter, MD  [facility] Internal Medicine, PGY-1

## 2022-10-03 NOTE — Unmapped (Addendum)
PHYSICAL THERAPY             Patient Name:  David Ellis       Medical Record Number: [id number]   Date of Birth: July 26, 1945  Sex: Male        Post-Discharge Physical Therapy Recommendations:  PT Post Acute Discharge Recommendations: Skilled PT services indicated, 3x weekly   PT DME Recommendations: None           Treatment Diagnosis: eval and treat, unsteady ambulation, deconditioning     Activity Tolerance: Tolerated treatment well     ASSESSMENT  Problem List: Decreased strength, Impaired sensation, Impaired balance, Decreased endurance, Decreased mobility, Gait deviation, Fall risk, Impaired ADLs      Assessment : David Ellis is a 77 y.o. male whose presentation is complicated by history of atrial flutter, diabetes, diabetic retinopathy, neuropathy, pulmonary embolism, stroke and renal transplant on tacrolimus who presents with elevated INR. Patient reports that prior to admission he needed min assist for some transfers and ADL's. He ambulated mod I with a SPC in the house, rollator in the community. He currently presents to PT with some weakness, impaired balance slightly below baseline, decreased activity tolerance, and below baseline functional mobility. He needed min assist to stand from recliner and commode, CGA to ambulate 41ft x 2 trials and 33ft x 1 trial. He needed min assist sit to supine. His 5 item raw AMPAC score is 15/20 with patient presenting as 44.61%impaired with basic functional mobility.  After review of contributing co-morbidities and personal factors, clinical presentation and exam findings, patient demonstrates low complexity for evaluation and development of POC. He will benefit from 3x post acute services. Pt has hospital D/C pending soon.      Today's Interventions: Balance activities, Endurance activities, Gait training, Patient/Family/Caregiver Education, Therapeutic activity     Personal Factors/Comorbidities Present: 3+   Examination of Body System: Musculoskeletal, Neurological, Integumentary  Clinical Presentation: Stable    Clinical Decision Making: Low        PLAN  Planned Frequency of Treatment:  1-2x per day for: 2-3x week       Planned Interventions: Gait training, Education (Patient/Family/Caregiver), Home exercise program, Neuromuscular re-education, Self-care / Home Management training, Therapeutic Exercise, Therapeutic Activity     Goals:   Patient and Family Goals: to improve balance     SHORT GOAL #1: Patient will perform functional transfers with CGA, LRAD               Time Frame : 2 weeks  SHORT GOAL #2: Patient will ambulate with LRAD, SBA, 157ft              Time Frame : 2 weeks  SHORT GOAL #3: Patient will negotiate 3 steps with B handrails, CGA, 2 weeks                                                          Long Term Goal #1: Patient will return to PLOF in 6 weeks        Prognosis:  Good  Positive Indicators: assist from spouse, motivation  Barriers to Discharge: None     SUBJECTIVE  Patient reports: This is the best 25$ I ever spent. Patient discussing methods he and spouse use for pt assist. Patient agreeable to work with PT  today.     Services patient receives prior to admission: PT  Prior Functional Status: Patient reports he is independent with ambulation using a SPC in the house, rollator in the community. He reports use of a lift chair for some transfers, but from EOB he and spouse use a lift stander device where his spouse holds the one side of the ring and patient pulls on the other to stand. Pt denies any recent falls.  Equipment available at home: Rollator, Agricultural consultant, Paediatric nurse with back, Straight cane, Lift-recliner        Past Medical History:   Diagnosis Date    Arthritis     Atrial flutter (CMS-HCC)     Cataract     Diabetes mellitus (CMS-HCC)     Diabetic nephropathy (CMS-HCC)     Diabetic retinopathy (CMS-HCC)     Fractures     Ganglion cyst     Hand injury     Heart disease     Hyperlipidemia     Hypertension     Impaired mobility     Joint pain     Lack of access to transportation     Osteomyelitis (CMS-HCC) 11/2014    Pulmonary embolism (CMS-HCC) 05/2014    Retinopathy due to secondary diabetes (CMS-HCC) 01/2013    Squamous cell skin cancer 11/2013    Stroke (CMS-HCC)     Tear of meniscus of knee     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    PR AMPUTATION METATARSAL+TOE,SINGLE Left 07/06/2014    Procedure: AMPUTATION, METATARSAL, WITH TOE SINGLE;  Surgeon: Marion Downer, MD;  Location: MAIN OR [facility];  Service: Vascular    PR AMPUTATION METATARSAL+TOE,SINGLE Left 10/28/2014    Procedure: AMPUTATION, METATARSAL, WITH TOE SINGLE;  Surgeon: Maple Mirza, MD;  Location: MAIN OR [place];  Service: Vascular    PR COLONOSCOPY FLX DX W/COLLJ SPEC WHEN PFRMD N/A 02/27/2022    Procedure: COLONOSCOPY, FLEXIBLE, PROXIMAL TO SPLENIC FLEXURE; DIAGNOSTIC, W/WO COLLECTION SPECIMEN BY BRUSH OR WASH;  Surgeon: Luanne Bras, MD;  Location: [facility];  Service: Gastroenterology    PR COLONOSCOPY W/BIOPSY SINGLE/MULTIPLE  02/27/2022    Procedure: COLONOSCOPY, FLEXIBLE, PROXIMAL TO SPLENIC FLEXURE; WITH BIOPSY, SINGLE OR MULTIPLE;  Surgeon: Luanne Bras, MD;  Location: [facility];  Service: Gastroenterology    PR COLSC FLX W/RMVL OF TUMOR POLYP LESION SNARE TQ Left 02/27/2022    Procedure: COLONOSCOPY FLEX; W/REMOV TUMOR/LES BY SNARE;  Surgeon: Luanne Bras, MD;  Location: [facility];  Service: Gastroenterology    PR VITRECTOMY,PANRETINAL LASER RX Right 11/26/2016    Procedure: VITRECTOMY, MECHANICAL, PARS PLANA APPROACH; WITH ENDOLASER PANRETINAL PHOTOCOAGULATION; Surgeon: Melvyn Novas, MD;  Location: [facility];  Service: Ophthalmology    PR Hessie Knows LASER RX Left 01/28/2017    Procedure: VITRECTOMY, MECHANICAL, PARS PLANA APPROACH; WITH ENDOLASER PANRETINAL PHOTOCOAGULATION;  Surgeon: Melvyn Novas, MD;  Location: [facility] OR [facility];  Service: Ophthalmology  SKIN BIOPSY      VASECTOMY               Family History   Problem Relation Age of Onset    Kidney disease Mother     Diabetes Mother     Hypertension Mother     Kidney disease Father     Cancer Father     Ulcers Father     Kidney disease Maternal Grandmother     Diabetes Maternal Grandmother     Glaucoma Neg Hx     Macular degeneration Neg Hx     Strabismus Neg Hx         Allergies: Penicillin g; Enalapril; Epinephrine; Grass pollen-bermuda, standard; Levofloxacin; Lisinopril; Mepivacaine hcl; and Penicillins                  Objective Findings  Precautions / Restrictions  Precautions: Falls precautions  Weight Bearing Status: Non-applicable  Required Braces or Orthoses: Non-applicable     Communication Preference: Verbal, Visual          Pain Comments: no c/o pain during session  Medical Tests / Procedures: Reviewed in EPIC  Equipment / Environment: Vascular access (PIV, TLC, Port-a-cath, PICC)     Vitals/Orthostatics : VSS per chart, NAD, Asymptomatic     Living Situation  Living Environment: House  Lives With: Spouse  Home Living: Two level home, Able to Live on main level with bedroom/bathroom, Stairs to enter with rails, Walk-in shower, Shower chair with back, Built-in shower seat, Grab bars in shower, Grab bars around toilet (has a basement)  Rail placement (outside): Bilateral rails  Number of Stairs to Enter (outside): 3  Caregiver Identified?: Yes  Caregiver Availability: 24 hours  Caregiver Ability: Limited lifting      Cognition: WFL  Cognition comment: alert  Orientation: Oriented x4  Visual/Perception: Wears Glasses/Contacts  Visual/Perception comment: for reading  Hearing: No deficit identified     Skin Inspection: Intact where visualized     Upper Extremities  UE ROM: Left WFL, Right WFL  UE Strength: Right WFL, Left WFL  UE comment: decreased dexterity at hands    Lower Extremities  LE ROM: Right WFL, Left WFL  LE Strength: Right Impaired/Limited, Left Impaired/Limited  LE comment: impaired for hamstring and hip extensors. Assist needed to weightshift to stand    Trunk and Cervical ROM  Cervical ROM: WFL  Trunk ROM: Impaired  Trunk ROM comment : decreased core strength, pt sliding down in chair, BUE assist needed and therapist min assist for pt to sit upright     Coordination: WFL  Proprioception: Not tested  Sensation: Impaired (numbness B lower legs from knees to feet)    Static Sitting-Level of Assistance: Contact guard  Dynamic Sitting-Level of Assistance: Minimum assistance    Static Standing-Level of Assistance: Contact guard  Dynamic Standing - Level of Assistance: Minimum assistance  Standing Balance comments: at RW      Bed Mobility:  (sit to supine)  Supine to Sit assistance level: Minimal assist, patient does 75% or more  Bed Mobility: min assist BLE onto bed sit to supine     Transfers: Sit to Stand  Sit to Stand assistance level: Minimal assist, patient does 75% or more  Transfer comments: min assist for weightshift, pt uses B recliner armrests to push to stand, pulls on grab bar to stand from commode      Gait Level of Assistance: Contact guard assist, steadying assist  Gait Assistive Device: Rolling walker  Gait Distance Ambulated (ft): 15 ft (68ft x 2 trials, 40ft x 1 trial with RW, CGA)  Skilled Treatment Performed: pt with shorter steps, decreased foot clearance, unsteady weightshifts     Stairs: n/t            Endurance: fair    AM-PAC-5 click  Difficulty turning over In bed?: A Little - Minimal/Contact Guard Assist/Supervision  Difficulty sitting down/standing up from chair with arms? : A Little - Minimal/Contact Guard Assist/Supervision  Difficulty moving from supine to sitting on edge of bed?: A Little - Minimal/Contact Guard Assist/Supervision  Help moving to and from bed from wheelchair?: A Little - Minimal/Contact Guard Assist/Supervision  Help currently needed walking in a hospital room?: A Little - Minimal/Contact Guard Assist/Supervision      Basic Mobility Score:  15    Score (in points): % of Functional Impairment, Limitation, Restriction  5: 100% impaired, limited, restricted  6-7: At least 80%, but less than 100% impaired, limited restricted  8-11: At least 60%, but less than 80% impaired, limited restricted  12-16: At least 40%, but less than 60% impaired, limited restricted  17-18: At least 20%, but less than 40% impaired, limited restricted  19: At least 1%, but less than 20% impaired, limited restricted  20: 0% impaired, limited restricted                 Medical Staff Made Aware: RN    57 mins assessment/treatment time    I attest that I have reviewed the above information.  Signed: Garey Ham, PT  Filed 10/03/2022

## 2022-10-03 NOTE — Unmapped (Signed)
Pt is alert and oriented, no complaints of pain. Vitals are within range. He was able to work with PT and OT. He was given his discharge instructions and had no questions at this time. He will be leaving via private vehicle with his wife and family friend.         Problem: Adult Inpatient Plan of Care  Goal: Plan of Care Review  Outcome: Discharged to Home  Goal: Patient-Specific Goal (Individualized)  Outcome: Discharged to Home  Goal: Absence of Hospital-Acquired Illness or Injury  Outcome: Discharged to Home  Intervention: Identify and Manage Fall Risk  Recent Flowsheet Documentation  Taken 10/03/2022 1400 by Durwin Glaze, RN  Safety Interventions:   fall reduction program maintained   low bed  Taken 10/03/2022 1200 by Durwin Glaze, RN  Safety Interventions:   fall reduction program maintained   low bed  Taken 10/03/2022 1000 by Durwin Glaze, RN  Safety Interventions:   fall reduction program maintained   low bed  Taken 10/03/2022 0805 by Durwin Glaze, RN  Safety Interventions:   bed alarm   fall reduction program maintained   low bed  Intervention: Prevent Skin Injury  Recent Flowsheet Documentation  Taken 10/03/2022 0909 by Durwin Glaze, RN  Positioning for Skin: Sitting in Chair  Taken 10/03/2022 0805 by Durwin Glaze, RN  Positioning for Skin: Supine/Back  Goal: Optimal Comfort and Wellbeing  Outcome: Discharged to Home  Goal: Readiness for Transition of Care  Outcome: Discharged to Home  Goal: Rounds/Family Conference  Outcome: Discharged to Home     Problem: Fall Injury Risk  Goal: Absence of Fall and Fall-Related Injury  Outcome: Discharged to Home  Intervention: Promote Injury-Free Environment  Recent Flowsheet Documentation  Taken 10/03/2022 1400 by Durwin Glaze, RN  Safety Interventions:   fall reduction program maintained   low bed  Taken 10/03/2022 1200 by Durwin Glaze, RN  Safety Interventions:   fall reduction program maintained   low bed  Taken 10/03/2022 1000 by Durwin Glaze, RN  Safety Interventions:   fall reduction program maintained   low bed  Taken 10/03/2022 0805 by Durwin Glaze, RN  Safety Interventions:   bed alarm   fall reduction program maintained   low bed     Problem: Self-Care Deficit  Goal: Improved Ability to Complete Activities of Daily Living  Outcome: Discharged to Home

## 2022-10-03 NOTE — Unmapped (Signed)
Thank you.   He was admitted yesterday.    [name], M.D.     [facility] at [facility]   [address]  [address]  [place], [place]  [address]  [phone]

## 2022-10-03 NOTE — Unmapped (Cosign Needed)
Geriatrics ([facility]) History & Physical    Assessment & Plan:   David Ellis is a 77 y.o. male whose presentation is complicated by history of atrial flutter, diabetes, diabetic retinopathy, neuropathy, pulmonary embolism, stroke and renal transplant on tacrolimus who presents with elevated INR.     Chief Problem  Elevated INR  Active Problems:    * Diabetic Neuropathy       Left Sided - Abdominal Discomfort      Weight Loss - Decreased Appetite      Constipation      ESRD s/p Kidey Transplant*  Chronic Problems     T2DM     HTN     Diabetic Retinopathy     Afib     Hyperlipidemia      Active Problems    Elevated INR  Likely due to a supra therapeutic coumadin dose iso decreased PO intake, no other significant abnormalities on initial labs. No signs or symptoms of active bleeding. Hold warfarin and trend PT-INR, consider vitamin K reversal if active bleeding or INR >10.   -hold warfarin  -daily PT-INR  -appreciate pharmacy assistance  -monitor for signs of active bleeding  -type and screen active    LLQ Abdominal Pain - Constipation - Decreased Appetite / Weight Loss  Given weight loss and abdominal pain present for 3 weeks, CT AP obtained in ED. 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 Reassuringly pt w/o evidence of pancreatitis or hepatic dysfunction on initial labs. 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 Also, fecaliths noted on CT and possible that abdominal pain 2/2 this rather than mass, will trial miralax and encourage fluids.  -Consider Oncology, Nephrology c/s in AM to discuss workup of pancreatic mass  -miralax daily    ESRD s/p Kidney Transplant  Pt follows w/Dr. Mottl, baseline Cr 1.2 and Cr stable at baseline on admission. Will continue home immunosuppressant regimen, notify Dr. Mottl of admission in AM. No infectious signs or symptoms on admission, no tenderness of transplanted kidney or signs of inflammation on CT. Consider nephrology c/s for risk/benefit discussion abdominal MRI as above.  -continue home myfortic 540 mg BID  -continue home tacrolimus 3 mg q morning, 2 mg q evening  -daily BMP  -daily tac trough    4Ms     Mentation CAM: CAM: Negative  CAM-S: not yet  6CIT: Not yet completed (please touch base with nursing)  Delirium Order Set: Ordered (appropriate for any patient >65)   Mobility Baseline functional status: Independent in ADLs  Current functional status: Needs Assistance with ADLs  PT/OT Ordered: Yes   Medications Reviewed home medications, screening for potentially inappropriate medications: Yes  Medications recommended de-prescribing: none   What Matters Geri Assessment complete: No, needs to be done       Chronic Problems    T2DM c/b Diabetic Retinopathy: dose-reduce home lantus from 60 to 50 units 2/2 decreased PO intake, SSI, plan to add back nutritional insulin 5/1  HTN: hold home losartan iso hyperkalemia, held outpt  Afib: s/p ablation  Hyperlipidemia: continue home pravastatin      The patient's presentation is complicated by the following clinically significant conditions requiring additional evaluation and treatment: - Chronic kidney disease POA requiring further investigation, treatment, or monitoring   - Hyperkalemia POA requiring further investigation, treatment, or monitoring           Issues Impacting Complexity of Management:  -The patient is at high risk of complications from elevated INR      Checklist:  Diet: Regular Diet  DVT PPx: Patient Already on Full Anticoagulation with warfarin  Code Status: Prior  Dispo: Patient appropriate for Observation based on expectation of ongoing need for hospitalization less than two midnights and/or low intensity of services provided    Team Contact Information:   Primary Team: Geriatrics ([facility])  Primary Resident: Nina  Wallace, MS3; Laksh Hinners, MD  Resident's Pager: [phone] (Geriatrics Senior Resident)    Chief Concern:   No Principal Problem: There is no principal problem currently on the Problem List. Please update the Problem List and refresh.      Subjective:   David Ellis is a 77 y.o. male with pertinent PMHx of atrial flutter, diabetes, diabetic retinopathy, neuropathy, pulmonary embolism, stroke and renal transplant on tacrolimus who presents with elevated INR.         History obtained by patient.       HPI:  Patient presented to the ED by recommendation of his PCP after multiple elevated INRs since seeing his PCP. His INR was elevated at 8.86 this morning, he was sent to the hospital for recheck INR that remained elevated at 9.89. He has been taken Coumadin 5mg nightly and his last INR in February was 2.37. He denies any recent bleeding from nose, gums, superficial wounds, urine, or stools. He was advised to stay in the ED for a vitamin K infusion. He denies any SOB, chest pain, edema, fever, or recent illness.    He reports left sided abdominal pain for the last few weeks. The pain is described as pressure-like, and is low in intensity. Onset was 3 weeks ago. Symptoms have been unchanged  since with some improvement over the past few days. Aggravating factors include fatty foods and excessive eating.  Alleviating factors include eating smaller and plain meals. Associated symptoms include anorexia and constipation. The patient denies diarrhea, dysuria, fever, hematochezia, nausea, and vomiting.  He reports some black stools but attributes to eating beets frequently. He denies any radiating abdominal pain or history of gallstones. He notes that eating blander foods that aren't fatty help and he has been eating a more bland diet. This HPI was also endorsed by his wife who witnessed his decrease in appetite over the last month.       Pertinent Surgical Hx  Past Surgical History:   Procedure Laterality Date    EYE SURGERY      NEPHRECTOMY TRANSPLANTED ORGAN Right     LURD - spouse received in 2013.    PR AMPUTATION METATARSAL+TOE,SINGLE Left 07/06/2014    Procedure: AMPUTATION, METATARSAL, WITH TOE SINGLE;  Surgeon: Raghuveer Vallabhaneni, MD;  Location: MAIN OR [place];  Service: Vascular    PR AMPUTATION METATARSAL+TOE,SINGLE Left 10/28/2014    Procedure: AMPUTATION, METATARSAL, WITH TOE SINGLE;  Surgeon: Alesandro A Marston, MD;  Location: MAIN OR [place];  Service: Vascular    PR COLONOSCOPY FLX DX W/COLLJ SPEC WHEN PFRMD N/A 02/27/2022    Procedure: COLONOSCOPY, FLEXIBLE, PROXIMAL TO SPLENIC FLEXURE; DIAGNOSTIC, W/WO COLLECTION SPECIMEN BY BRUSH OR WASH;  Surgeon: Hansen, Jonathan James, MD;  Location: [facility] [place];  Service: Gastroenterology    PR COLONOSCOPY W/BIOPSY SINGLE/MULTIPLE  02/27/2022    Procedure: COLONOSCOPY, FLEXIBLE, PROXIMAL TO SPLENIC FLEXURE; WITH BIOPSY, SINGLE OR MULTIPLE;  Surgeon: Hansen, Jonathan James, MD;  Location: [facility] [place];  Service: Gastroenterology      PR COLSC FLX W/RMVL OF TUMOR POLYP LESION SNARE TQ Left 02/27/2022    Procedure: COLONOSCOPY FLEX; W/REMOV TUMOR/LES BY SNARE;  Surgeon: Hansen, Jonathan James, MD;  Location: [facility] [place];  Service: Gastroenterology    PR VITRECTOMY,PANRETINAL LASER RX Right 11/26/2016    Procedure: VITRECTOMY, MECHANICAL, PARS PLANA APPROACH; WITH ENDOLASER PANRETINAL PHOTOCOAGULATION;  Surgeon: Jan Niklas Ulrich, MD;  Location: [facility] [place];  Service: Ophthalmology    PR VITRECTOMY,PANRETINAL LASER RX Left 01/28/2017    Procedure: VITRECTOMY, MECHANICAL, PARS PLANA APPROACH; WITH ENDOLASER PANRETINAL PHOTOCOAGULATION;  Surgeon: Jan Niklas Ulrich, MD;  Location: [facility] [place];  Service: Ophthalmology    SKIN BIOPSY      VASECTOMY          Pertinent Family Hx  Per chart, no new pertinent family history.    Pertinent Social Hx   Per chart and HPI, no new social hx.   Social History     Tobacco Use Smoking Status Never    Passive exposure: Never   Smokeless Tobacco Never     Alcohol Use: Not At Risk (07/19/2020)    Alcohol Use     How often do you have a drink containing alcohol?: Never     How many drinks containing alcohol do you have on a typical day when you are drinking?: 1 - 2     How often do you have 5 or more drinks on one occasion?: Never      Social History     Social History Narrative    Works for the city of [place].    Retired from the [employer].      Married.      Two healthy children.          Allergies  Penicillin g; Egg derived; Enalapril; Epinephrine; Grass pollen-bermuda, standard; Levofloxacin; Lisinopril; Mepivacaine hcl; and Penicillins    I reviewed the Medication List. The current list is Accurate  Prior to Admission medications    Medication Dose, Route, Frequency   acetaminophen (TYLENOL) 325 MG tablet Oral   aspirin (ECOTRIN) 81 MG tablet 81 mg, Oral, Daily PRN   blood-glucose meter kit Use as instructed.   cholecalciferol, vitamin D3-50 mcg, 2,000 unit,, 50 mcg (2,000 unit) tablet 2,000 Units, Oral, Daily (standard)   dulaglutide (TRULICITY) 1.5 mg/0.5 mL PnIj 1.5 mg, Subcutaneous, Every 7 days   dulaglutide (TRULICITY) 1.5 mg/0.5 mL PnIj 1.5 mg, Subcutaneous, Weekly   empagliflozin (JARDIANCE) 10 mg tablet 10 mg, Oral, Every morning  Patient taking differently: Take 0.5 tablets (5 mg total) by mouth every morning.   flash glucose sensor (FREESTYLE LIBRE 14 DAY SENSOR) kit Other, Every 14 days   furosemide (LASIX) 40 MG tablet 40 mg, Oral, Daily (standard), 40 mg daily. And 1 additional tablet for edema.   gabapentin (NEURONTIN) 300 MG capsule TAKE 1 CAPSULE BY MOUTH TWICE DAILY AS DIRECTED   HUMALOG KWIKPEN INSULIN 100 unit/mL injection pen INJECT 15-25 UNITS WITH MEALS + CORRECTION **MAX 100 UNITS A DAY**   hydrocortisone 2.5 % cream    insulin degludec 200 unit/mL (3 mL) InPn 80 Units, Subcutaneous, Daily (standard), Titrate up to 100units daily as instructed.  Patient taking differently: Inject 0.325 mL (65 Units total) under the skin daily. Titrate up to 100units daily as instructed.   insulin glargine U-300 conc (TOUJEO SOLOSTAR) 300 unit/mL (1.5 mL) injection pen 70 Units, Subcutaneous, Daily (standard)   ketoconazole (NIZORAL) 2 % cream    LOKELMA 10 gram PwPk   packet DOSE: 1 PACKET (10 grams) every other day.  Empty entire contents of ONE packet into a glass with 45 mL of water. Stir well and drink immediately; if powder remains in the glass, add water, stir and drink immediately; repeat until no powder remains.  Patient taking differently: DOSE: 1 PACKET (10 grams) every other day.  Empty entire contents of ONE packet into a glass with 45 mL of water. Stir well and drink immediately; if powder remains in the glass, add water, stir and drink immediately; repeat until no powder remains.   losartan (COZAAR) 25 MG tablet 25 mg, Oral, Daily (standard)   magnesium chloride (SLOW_MAG) 64 mg TbEC 128 mg, Oral, 3 times a day, (2 tablets) 9am 1 pm and 9 pm   metoPROLOL succinate (TOPROL-XL) 100 MG 24 hr tablet TAKE ONE TABLET TWICE DAILY   miscellaneous medical supply Misc 1 application., Miscellaneous, Daily PRN, Lightweight wheelchair.   miscellaneous medical supply Misc 1 application., Miscellaneous, Nightly, Recommend CPAP 14 cm H2O with EPR 3 and heated humidifier for nasal dryness, mask: ResMed Activa Lt nasal mask (Large).   MYFORTIC 180 mg EC tablet 540 mg, Oral, 2 times a day   PEN NEEDLE 32 gauge x 5/32 (4 mm) Ndle USE 3 TIMES DAILY BEFORE MEALS   pravastatin (PRAVACHOL) 40 MG tablet 40 mg, Oral, Daily (standard)   semaglutide (OZEMPIC) 0.25 mg or 0.5 mg (2 mg/3 mL) PnIj 0.5 mg, Subcutaneous, Weekly   tacrolimus (PROGRAF) 1 MG capsule Take 3 capsules (3 mg total) by mouth in the morning AND 2 capsules (2 mg total) in the evening.   TRULICITY 1.5 mg/0.5 mL PnIj INJECT 0.5ML (1.5MG TOTAL) UNDER THE SKIN EVERY 7 DAYS   warfarin (JANTOVEN) 5 MG tablet TAKE 1 TABLET BY MOUTH DAILY WITH EVENING MEAL. TAKE 6 DAYS A WEEK (TUESDAY THROUGH SUNDAY) AS DIRECTED.   empagliflozin (JARDIANCE) 10 mg tablet 10 mg, Oral, Daily (standard)   multivitamin capsule 1 capsule, Oral, Daily (standard)       Designated Healthcare Decision Maker:  Mr. Fetterman currently has decisional capacity for healthcare decision-making and is able to designate a surrogate healthcare decision maker. Mr. Luo's designated healthcare decision maker(s) is/are CHERYL Biermann the patient's spouse) as denoted by stated patient preference.    Objective:   Physical Exam:  Temp:  [36.8 ??C (98.2 ??F)] 36.8 ??C (98.2 ??F)  Heart Rate:  [62-65] 65  SpO2 Pulse:  [64] 64  Resp:  [16-18] 16  BP: (126-127)/(65-111) 126/111  SpO2:  [97 %-98 %] 97 %    Gen: NAD, converses   Eyes: Sclera anicteric, EOMI grossly normal   HENT: Atraumatic, normocephalic  Neck: Trachea midline  Heart: RRR  Lungs: CTAB, no crackles or wheezes  Abdomen: Soft, NTND, no palpable organomegaly, NABS  Extremities: No edema  Neuro: Grossly symmetric, non-focal    Skin:  bilateral red discoloration noted lower extremities, multiple seborrhaic keratoses noted over abdomen  Psych: Alert, oriented     Nicolette Gieske Burris Halbert Jesson, MD  Internal Medicine PGY-2

## 2022-10-03 NOTE — Unmapped (Cosign Needed)
Geriatrics ([facility]) History & Physical    Assessment & Plan:   David Ellis is a 77 y.o. male whose presentation is complicated by history of atrial flutter, diabetes, diabetic retinopathy, neuropathy, pulmonary embolism, stroke and renal transplant on tacrolimus who presents with elevated INR.     Chief Problem  Elevated INR  Active Problems:    * Diabetic Neuropathy       Left Sided - Abdominal Discomfort      Weight Loss - Decreased Appetite      Constipation      ESRD s/p Kidey Transplant*  Chronic Problems     T2DM     HTN     Diabetic Retinopathy     Afib     Hyperlipidemia      Active Problems    Elevated INR  Likely due to a supra therapeutic coumadin dose iso decreased PO intake, no other significant abnormalities on initial labs. No signs or symptoms of active bleeding. Hold warfarin and trend PT-INR, consider vitamin K reversal if active bleeding or INR >10.   -hold warfarin  -daily PT-INR  -appreciate pharmacy assistance  -monitor for signs of active bleeding  -type and screen active    LLQ Abdominal Pain - Constipation - Decreased Appetite / Weight Loss  Given weight loss and abdominal pain present for 3 weeks, CT AP obtained in ED. 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 Reassuringly pt w/o evidence of pancreatitis or hepatic dysfunction on initial labs. 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 Also, fecaliths noted on CT and possible that abdominal pain 2/2 this rather than mass, will trial miralax and encourage fluids.  -Consider Oncology, Nephrology c/s in AM to discuss workup of pancreatic mass  -miralax daily    ESRD s/p Kidney Transplant  Pt follows w/Dr. Arvin Collard, baseline Cr 1.2 and Cr stable at baseline on admission. Will continue home immunosuppressant regimen, notify Dr. Arvin Collard of admission in AM. No infectious signs or symptoms on admission, no tenderness of transplanted kidney or signs of inflammation on CT. Consider nephrology c/s for risk/benefit discussion abdominal MRI as above.  -continue home myfortic 540 mg BID  -continue home tacrolimus 3 mg q morning, 2 mg q evening  -daily BMP  -daily tac trough        Mentation CAM: CAM: Negative  CAM-S: not yet  6CIT: Not yet completed (please touch base with nursing)  Delirium Order Set: Ordered (appropriate for any patient >65)   Mobility Baseline functional status: Independent in ADLs  Current functional status: Needs Assistance with ADLs  PT/OT Ordered: Yes   Medications Reviewed home medications, screening for potentially inappropriate medications: Yes  Medications recommended de-prescribing: none   What Matters Geri Assessment complete: No, needs to be done       Chronic Problems    T2DM c/b Diabetic Retinopathy: dose-reduce home lantus from 60 to 50 units 2/2 decreased PO intake, SSI, plan to add back nutritional insulin 5/1  HTN: hold home losartan iso hyperkalemia, held outpt  Afib: s/p ablation  Hyperlipidemia: continue home pravastatin      The patient's presentation is complicated by the following clinically significant conditions requiring additional evaluation and treatment: - Chronic kidney disease POA requiring further investigation, treatment, or monitoring   - Hyperkalemia POA requiring further investigation, treatment, or monitoring  Issues Impacting Complexity of Management:  -The patient is at high risk of complications from elevated INR      Checklist:  Diet: Regular Diet  DVT PPx: Patient Already on Full Anticoagulation with warfarin  Code Status: Prior  Dispo: Patient appropriate for Observation based on expectation of ongoing need for hospitalization less than two midnights and/or low intensity of services provided    Team Contact Information:   Primary Team: Geriatrics ([facility])  Primary Resident: Lorrene Reid, MS3; Cristy Hilts, MD  Resident's Pager: [phone] (Geriatrics Senior Resident)    Chief Concern:   No Principal Problem: There is no principal problem currently on the Problem List. Please update the Problem List and refresh.      Subjective:   David Ellis is a 77 y.o. male with pertinent PMHx of atrial flutter, diabetes, diabetic retinopathy, neuropathy, pulmonary embolism, stroke and renal transplant on tacrolimus who presents with elevated INR.         History obtained by patient.       HPI:  Patient presented to the ED by recommendation of his PCP after multiple elevated INRs since seeing his PCP. His INR was elevated at 8.86 this morning, he was sent to the hospital for recheck INR that remained elevated at 9.89. He has been taken Coumadin 5mg  nightly and his last INR in February was 2.37. He denies any recent bleeding from nose, gums, superficial wounds, urine, or stools. He was advised to stay in the ED for a vitamin K infusion. He denies any SOB, chest pain, edema, fever, or recent illness.    He reports left sided abdominal pain for the last few weeks. The pain is described as pressure-like, and is low in intensity. Onset was 3 weeks ago. Symptoms have been unchanged  since with some improvement over the past few days. Aggravating factors include fatty foods and excessive eating.  Alleviating factors include eating smaller and plain meals. Associated symptoms include anorexia and constipation. The patient denies diarrhea, dysuria, fever, hematochezia, nausea, and vomiting.  He reports some black stools but attributes to eating beets frequently. He denies any radiating abdominal pain or history of gallstones. He notes that eating blander foods that aren't fatty help and he has been eating a more bland diet. This HPI was also endorsed by his wife who witnessed his decrease in appetite over the last month.       Pertinent Surgical Hx  Past Surgical History:   Procedure Laterality Date    EYE SURGERY      NEPHRECTOMY TRANSPLANTED ORGAN Right     LURD - spouse received in 2013.    PR AMPUTATION METATARSAL+TOE,SINGLE Left 07/06/2014    Procedure: AMPUTATION, METATARSAL, WITH TOE SINGLE;  Surgeon: Marion Downer, MD;  [facility]: [facility];  Service: Vascular    PR AMPUTATION METATARSAL+TOE,SINGLE Left 10/28/2014    Procedure: AMPUTATION, METATARSAL, WITH TOE SINGLE;  Surgeon: Maple Mirza, MD;  Location: MAIN OR [place];  Service: Vascular    PR COLONOSCOPY FLX DX W/COLLJ SPEC WHEN PFRMD N/A 02/27/2022    Procedure: COLONOSCOPY, FLEXIBLE, PROXIMAL TO SPLENIC FLEXURE; DIAGNOSTIC, W/WO COLLECTION SPECIMEN BY BRUSH OR WASH;  Surgeon: Luanne Bras, MD;  Location: [facility]ED[facility];  Service: Gastroenterology    PR COLONOSCOPY W/BIOPSY SINGLE/MULTIPLE  02/27/2022    Procedure: COLONOSCOPY, FLEXIBLE, PROXIMAL TO SPLENIC FLEXURE; WITH BIOPSY, SINGLE OR MULTIPLE;  Surgeon: Luanne Bras, MD;  Location: [facility] PROCED[facility];  Service: Gastroenterology  PR COLSC FLX W/RMVL OF TUMOR POLYP LESION SNARE TQ Left 02/27/2022    Procedure: COLONOSCOPY FLEX; W/REMOV TUMOR/LES BY SNARE;  Surgeon: Luanne Bras, MD;  Location: [facility] PROC[facility];  Service: Gastroenterology    PR VITRECTOMY,PANRETINAL LASER RX Right 11/26/2016    Procedure: VITRECTOMY, MECHANICAL, PARS PLANA APPROACH; WITH ENDOLASER PANRETINAL PHOTOCOAGULATION;  Surgeon: Melvyn Novas, MD;  [facility]: [facility] OR [facility];  Service: Ophthalmology    PR Hessie Knows LASER RX Left 01/28/2017    Procedure: VITRECTOMY, MECHANICAL, PARS PLANA APPROACH; WITH ENDOLASER PANRETINAL PHOTOCOAGULATION;  Surgeon: Melvyn Novas, MD;  Location: [facility] OR [facility];  Service: Ophthalmology    SKIN BIOPSY      VASECTOMY          Pertinent Family Hx  Per chart, no new pertinent family history.    Pertinent Social Hx   Per chart and HPI, no new social hx.   Social History     Tobacco Use Smoking Status Never    Passive exposure: Never   Smokeless Tobacco Never     Alcohol Use: Not At Risk (07/19/2020)    Alcohol Use     How often do you have a drink containing alcohol?: Never     How many drinks containing alcohol do you have on a typical day when you are drinking?: 1 - 2     How often do you have 5 or more drinks on one occasion?: Never      Social History     Social History Narrative    Works for the city of [employer].    Retired from the [employer].      Married.      Two healthy children.          Allergies  Penicillin g; Egg derived; Enalapril; Epinephrine; Grass pollen-bermuda, standard; Levofloxacin; Lisinopril; Mepivacaine hcl; and Penicillins    I reviewed the Medication List. The current list is Accurate  Prior to Admission medications    Medication Dose, Route, Frequency   acetaminophen (TYLENOL) 325 MG tablet Oral   aspirin (ECOTRIN) 81 MG tablet 81 mg, Oral, Daily PRN   blood-glucose meter kit Use as instructed.   cholecalciferol, vitamin D3-50 mcg, 2,000 unit,, 50 mcg (2,000 unit) tablet 2,000 Units, Oral, Daily (standard)   dulaglutide (TRULICITY) 1.5 mg/0.5 mL PnIj 1.5 mg, Subcutaneous, Every 7 days   dulaglutide (TRULICITY) 1.5 mg/0.5 mL PnIj 1.5 mg, Subcutaneous, Weekly   empagliflozin (JARDIANCE) 10 mg tablet 10 mg, Oral, Every morning  Patient taking differently: Take 0.5 tablets (5 mg total) by mouth every morning.   flash glucose sensor (FREESTYLE LIBRE 14 DAY SENSOR) kit Other, Every 14 days   furosemide (LASIX) 40 MG tablet 40 mg, Oral, Daily (standard), 40 mg daily. And 1 additional tablet for edema.   gabapentin (NEURONTIN) 300 MG capsule TAKE 1 CAPSULE BY MOUTH TWICE DAILY AS DIRECTED   HUMALOG KWIKPEN INSULIN 100 unit/mL injection pen INJECT 15-25 UNITS WITH MEALS + CORRECTION **MAX 100 UNITS A DAY**   hydrocortisone 2.5 % cream    insulin degludec 200 unit/mL (3 mL) InPn 80 Units, Subcutaneous, Daily (standard), Titrate up to 100units daily as instructed.  Patient taking differently: Inject 0.325 mL (65 Units total) under the skin daily. Titrate up to 100units daily as instructed.   insulin glargine U-300 conc (TOUJEO SOLOSTAR) 300 unit/mL (1.5 mL) injection pen 70 Units, Subcutaneous, Daily (standard)   ketoconazole (NIZORAL) 2 % cream    LOKELMA 10 gram PwPk  packet DOSE: 1 PACKET (10 grams) every other day.  Empty entire contents of ONE packet into a glass with 45 mL of water. Stir well and drink immediately; if powder remains in the glass, add water, stir and drink immediately; repeat until no powder remains.  Patient taking differently: DOSE: 1 PACKET (10 grams) every other day.  Empty entire contents of ONE packet into a glass with 45 mL of water. Stir well and drink immediately; if powder remains in the glass, add water, stir and drink immediately; repeat until no powder remains.   losartan (COZAAR) 25 MG tablet 25 mg, Oral, Daily (standard)   magnesium chloride (SLOW_MAG) 64 mg TbEC 128 mg, Oral, 3 times a day, (2 tablets) 9am 1 pm and 9 pm   metoPROLOL succinate (TOPROL-XL) 100 MG 24 hr tablet TAKE ONE TABLET TWICE DAILY   miscellaneous medical supply Misc 1 application., Miscellaneous, Daily PRN, Lightweight wheelchair.   miscellaneous medical supply Misc 1 application., Miscellaneous, Nightly, Recommend CPAP 14 cm H2O with EPR 3 and heated humidifier for nasal dryness, mask: ResMed Activa Lt nasal mask (Large).   MYFORTIC 180 mg EC tablet 540 mg, Oral, 2 times a day   PEN NEEDLE 32 gauge x 5/32 (4 mm) Ndle USE 3 TIMES DAILY BEFORE MEALS   pravastatin (PRAVACHOL) 40 MG tablet 40 mg, Oral, Daily (standard)   semaglutide (OZEMPIC) 0.25 mg or 0.5 mg (2 mg/3 mL) PnIj 0.5 mg, Subcutaneous, Weekly   tacrolimus (PROGRAF) 1 MG capsule Take 3 capsules (3 mg total) by mouth in the morning AND 2 capsules (2 mg total) in the evening.   TRULICITY 1.5 mg/0.5 mL PnIj INJECT 0.5ML (1.5MG  TOTAL) UNDER THE SKIN EVERY 7 DAYS   warfarin (JANTOVEN) 5 MG tablet TAKE 1 TABLET BY MOUTH DAILY WITH EVENING MEAL. TAKE 6 DAYS A WEEK (TUESDAY THROUGH SUNDAY) AS DIRECTED.   empagliflozin (JARDIANCE) 10 mg tablet 10 mg, Oral, Daily (standard)   multivitamin capsule 1 capsule, Oral, Daily (standard)       Librarian, academic:  Mr. Mcaneny currently has decisional capacity for healthcare decision-making and is able to designate a surrogate healthcare decision maker. Mr. Cheairs designated healthcare decision maker(s) is/are CHERYL Madeira the patient's spouse) as denoted by stated patient preference.    Objective:   Physical Exam:  Temp:  [36.8 ??C (98.2 ??F)] 36.8 ??C (98.2 ??F)  Heart Rate:  [62-65] 65  SpO2 Pulse:  [64] 64  Resp:  [16-18] 16  BP: (126-127)/(65-111) 126/111  SpO2:  [97 %-98 %] 97 %    Gen: NAD, converses   Eyes: Sclera anicteric, EOMI grossly normal   HENT: Atraumatic, normocephalic  Neck: Trachea midline  Heart: RRR  Lungs: CTAB, no crackles or wheezes  Abdomen: Soft, NTND, no palpable organomegaly, NABS  Extremities: No edema  Neuro: Grossly symmetric, non-focal    Skin:  bilateral red discoloration noted lower extremities, multiple seborrhaic keratoses noted over abdomen  Psych: Alert, oriented     Alejandra Barna Laurena Bering, MD  Internal Medicine PGY-2

## 2022-10-03 NOTE — Unmapped (Signed)
Pt compliant with CPAP therapy.

## 2022-10-03 NOTE — Unmapped (Signed)
BRIEF ONCOLOGY TREATMENT PLAN NOTE  Please note that this is a brief note before confirmed tissue diagnosis. As such, recommendations should be interpreted as tentative/preliminary and interpreted in the context of the overall clinical picture.    Requesting Attending Physician:  Yolanda Bonine, MD  Service Requesting Consult: Geriatrics ([facility])  Primary Oncologist: TBD    Reason for Consult: Pancreatic mass    Assessment: David Ellis is a 77 y.o. man with PMH atrial flutter, diabetes, diabetic retinopathy, neuropathy, pulmonary embolism, stroke and renal transplant on tacrolimus who was admitted for elevated INR in the setting of weight loss and abdominal pain x3 weeks.    CT A/P without contrast 10/02/22 in the ED to assess abdominal pain noted:  Impression      --Left upper quadrant abdominal mass measuring 5.3 x 3.7 x 3.7 cm. The mass is in the area of the pancreatic tail and a pancreatic neoplasm is favored. It abuts the adjacent stomach and proximal jejunum and gastric/enteric neoplasm cannot be entirely excluded. Lack of contrast limits evaluation. Nonemergent MRI of the abdomen is recommended.      --Abdominal lymphadenopathy and left adrenal nodule which is suspicious for metastasis. (Aortocaval LN 2.1cm, L adrenal nodule 2.8cm).      -- Partially visualized solid pulmonary nodules (up to 0.6cm) and right pleural effusion. This may be infectious or inflammatory or metastatic.      -- Layering fluid in the right paracolic gutter, nonspecific     Neoplastic differential includes pancreatic neoplasms as well as neoplasm of the stomach, GI tract, and less likely lung, adrenal, or melanoma. Patient management include disease staging and management varies significantly and is determined by pathology. The latter relies on an accurate definitive diagnosis, which requires an adequate tissue sample. The next step in this patient's management, therefore, is to obtain tissue diagnosis, following, medical oncology can provide effective consultation regarding prognosis, treatment and disposition.    Per discussion with primary team, patient is currently OK for discharge, with improved appetite since admission and resolution of abdominal pain.    Recommendations:   - Please obtain CEA, [id number]  - Plan for PET/CT whole body as an outpatient to help delineate biopsy targets; biopsy would need to happen pending INR recovery and in coordination with cardiology managing anticoagulation plan  - Will help arrange follow up with [facility] undiagnosed cancer clinic.    This patient has been discussed with Dr. Randolph Bing. These recommendations were discussed with the primary team.     Please contact the Solid oncology consult fellow: [phone] with any further questions.    12:27 PM 10/03/2022   Modesto Charon, MD  Hematology/Oncology, PGY-5

## 2022-10-04 DIAGNOSIS — R935 Abnormal findings on diagnostic imaging of other abdominal regions, including retroperitoneum: Principal | ICD-10-CM

## 2022-10-04 DIAGNOSIS — R1900 Intra-abdominal and pelvic swelling, mass and lump, unspecified site: Principal | ICD-10-CM

## 2022-10-04 DIAGNOSIS — R591 Generalized enlarged lymph nodes: Principal | ICD-10-CM

## 2022-10-05 ENCOUNTER — Ambulatory Visit: Admit: 2022-10-05 | Discharge: 2022-10-06 | Payer: MEDICARE

## 2022-10-05 LAB — PROTIME-INR
INR: 2.71
PROTIME: 29.2 s — ABNORMAL HIGH (ref 9.9–12.6)

## 2022-10-05 NOTE — Unmapped (Signed)
I called to let him know I had heard from admission team of findings in hospital, and discussed via Secure Chat w team as well as Dr. Chinita Pester and Dellis Filbert.  So sorry to hear of findings on CT.  When I saw him on 09/26/22 we had discussed staying off of Trulicity and Ozempic in light of his symptoms, but with steady weight loss since starting GLP and normal LFT 08/29/2022 I did not order any imaging and opted to see if sx improved off medication.   Also no red flags of sudden hyperglycemia as you might see with new onset pancreatic mass/ weight loss/ new onset diabetes.     Reviewed that he has appt with Dr. Dan Europe on Monday. Reviewed that any imaging (especially if requires contrast) will be reviewed w Dr. Chinita Pester.      Reviewed that treatment (especially if it involves glucocorticoids) could cause shifts in glucose control.   Maintain CGM and insulin.    Let me know if sig trends of hyper/hypoglycemia and we can adjust insulin and schedule follow up as needed.    Pt expresses understanding and appreciation.    In terms of providing optimal patient care, I have personal concerns as his MD that the subspecialty focus on keeping glucoses normal, adjusting insulin, documenting for medicare to keep CGM coming, documenting foot exam for requested DM shoes, all in time of a return visit all contributed to not being more proactive in evaluating GI symptoms -- especially when the chief GI sx can be a known SE of a medication we have worked hard to keep him supplied with.   Important reminder that some types of weight loss / GI sx on GLP1a needs further questioning.

## 2022-10-05 NOTE — Unmapped (Signed)
Multidisciplinary Oncology Program Intake Form    Referral Receive Date: 10/03/22    Rapid Access Appointment was offered and declined  No    Reason for Referral: Left upper quadrant abdominal mass     Disease Group: UCC  Specialty: Medical Oncology    Referral Method: Epic    Insurance  Primary Insurance: Medicare  Authorization Obtained: Yes    Record Collection: Records  Requested: N/A  Date Requested: N/A  Date Received: N/A  External Facility Name: N/A  Phone / Fax: N/A    Requested: N/A Imaging  Date Requested: N/A  Date Received: N/A  External Facility Name: N/A  Phone / Fax: N/A  Pt. Informed of possible charges associated with imaging? NA    Requested: N/A Pathology  Date Requested: N/A  Date Received: N/A  External Facility Name: N/A  Phone / Fax: N/A  Pt. Informed of possible charges associated with pathology? NA    COVID Screening  Travel Screening Completed: Yes  Has patient tested positive for COVID in the past 21 days?: No    Close the Loop Communication:    Referring Provider Contacted: No  Patient Contacted: Yes    Welcome Packet   Date Sent: 10/05/22  Sent: Email

## 2022-10-06 NOTE — Unmapped (Signed)
MEDICAL ONCOLOGY INITIAL CONSULTATION NOTE     Encounter Date:  10/08/2022  Patient Name:  David Ellis  Patient Age:  77 y.o.  Medical Record Number:  [id number]    This is a consult requested by:   Yolanda Bonine, Md  [address]  [facility]  [place],  [place] [address]      Primary Care Provider:  Sharlee Blew, MD  [address]  [place] [address]    DIAGNOSIS:     Mass in the tail of the pancreas    ASSESSMENT:      David Ellis is a 77 y.o. male with atrial flutter, diabetes, diabetic retinopathy, neuropathy, pulmonary embolism, stroke and renal transplant on tacrolimus who was recently admitted with an elevated INR due to coumadin and poor PO intake. During the admission he was found to have LUQ abdominal mass in the area of the pancreatic tail concerning for pancreatic neoplasm. Oncology was consulted and patient was established with the undiagnosed cancer clinic for an expedited cancer workup. Explained to patient and his wife the role of the undiagnosed clinic. Let them know that patient does not have a cancer diagnosis yet, as tissue diagnosis is still pending. Explained that we will get a CT Chest to complete staging scans and identify any potential biopsy sites in the chest. We will reach out in the mean time to the patient's cardiologist to prepare an anticoagulation plan once we are able to biopsy.     Patient being classified as ECOG 2, a biopsy was recommended. The rationale behind this decision primarily stems from the need to obtain a definitive tissue diagnosis. The imaging findings suggest a mass in the pancreatic tail region, but there is also a possibility that this mass could be gastric or enteric in origin due to its proximity to the stomach and proximal jejunum. A biopsy will allow for accurate histological examination, which is crucial for determining the exact nature of the mass. Identifying the type of neoplasm--whether it is a pancreatic, gastric, or enteric tumor--is essential not only for determining the appropriate treatment plan but also for providing the patient and his family with clear information regarding prognosis and potential treatment options. Even though the procedure carries risks, especially in older patients with reduced physical reserves, the potential benefits of obtaining a clear diagnosis and the possibility of identifying a treatable condition justify the decision to proceed with a biopsy.    We introduced the palliative care team as they will help [place] manage his symptoms and continue with goals of care conversations as we move through the diagnosis and treatment plan discussions. We explained that we would start some medications for treatment of his pain and nausea. All questions and concerns were answered.       RECOMMENDATIONS AND PLAN:     Pancreatic Mass  - Proceed with EUS or IR node biopsy after cardiology for the anticoagulation. Have reached out to patients cardiologist for recommendations on anticoagulation   - Referral placed to IR / GI for consideration of possible biopsies.   -- Given the patient's CHA2DS2-VASc score of 7 and low bleeding risk for the biopsy procedure, would recommend discontinuing warfarin with bridging with the Lovenox bridging plan previously utilized for his colonoscopy.   --- 5 days before the procedure: Discontinue warfarin.   --- 3 days before the procedure: Initiate Lovenox 110 mg subcutaneously twice daily.   --- On the morning of the procedure: Do  not administer Lovenox or warfarin.  --- On the night of the procedure: Resume warfarin at the prior dosage.   --- The morning after the procedure: Restart Lovenox 110 mg subcutaneously twice daily until INR reaches 2.0.   --- Check PT/INR 4 days after restarting warfarin.   --- Upon achieving an INR of 2.0 or greater: Discontinue Lovenox.   - Obtain Chest CT without contrast to complete staging and determine possible chest biopsy site.  - PET scheduled will cancel if not needed.  - Genetic referral if pancreatic cancer is confirmed  - Obtain Ca 19.9 once diagnosis is made  - Obtain CEA once diagnosis is made    2. Abdominal pain / Nausea  - Pain control with tylenol as the pt has been doing but if it gets worse the pt should call and we will try opioids prn.   - Continue Nexium   - Prescribed compazine 10 mg q6h PRN for nausea  - Referral placed to Palliative care for symptom management     3. Weight loss  - Referral placed to Nutrition    4. Hyperkalemia   - Noted on latest labs.   - To follow with primary care and nephrology  - Increase Lokelma to 15 g every other day in the meantime.    RTC: Video follow up in two weeks     Patient seen and discussed with Dr. Dan Europe, Jason Fila, * in clinic.     Victorio Palm, MD PhD  Hematology / Oncology Fellow    HISTORY OF PRESENTATION:    David Ellis is a 77 y.o. male who is seen in consultation at the request of Mournighan, Alain Marion, Md for evaluation of his newly found mass in the tail of the pancreas concerning for malignancy. Pertinent oncologic history is reviewed with the patient and in the medical records, including:    Currently, David Ellis reports that he had been experiencing abdominal pain and weight loss for 3 weeks, which was initially attributed to changes in diabetes medication side effects. Patient states however that probably since Thanksgiving he has had a decreased appetite. States that his pain waxes and wains. At best 0/10 at worst 7/10. Describes pain as mostly in LUQ and middle lower quadrant with defecation. Has not been taking much for pain. Reports some melanotic stools that come and go.         PAST MEDICAL AND SURGICAL HISTORY:     Past Medical History:   Diagnosis Date    Arthritis     Atrial flutter (CMS-HCC)     Cataract     Diabetes mellitus (CMS-HCC)     Diabetic nephropathy (CMS-HCC)     Diabetic retinopathy (CMS-HCC)     Fractures     Ganglion cyst     Hand injury     Heart disease     Hyperlipidemia     Hypertension     Impaired mobility     Joint pain     Lack of access to transportation     Osteomyelitis (CMS-HCC) 11/2014    Pulmonary embolism (CMS-HCC) 05/2014    Retinopathy due to secondary diabetes (CMS-HCC) 01/2013    Squamous cell skin cancer 11/2013    Stroke (CMS-HCC)     Tear of meniscus of knee     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    PR AMPUTATION METATARSAL+TOE,SINGLE Left 07/06/2014    Procedure: AMPUTATION, METATARSAL, WITH TOE SINGLE;  Surgeon: Marion Downer, MD;  Location: MAIN OR [facility];  Service: Vascular    PR AMPUTATION METATARSAL+TOE,SINGLE Left 10/28/2014    Procedure: AMPUTATION, METATARSAL, WITH TOE SINGLE;  Surgeon: Maple Mirza, MD;  Location: MAIN OR [facility];  Service: Vascular    PR COLONOSCOPY FLX DX W/COLLJ SPEC WHEN PFRMD N/A 02/27/2022    Procedure: COLONOSCOPY, FLEXIBLE, PROXIMAL TO SPLENIC FLEXURE; DIAGNOSTIC, W/WO COLLECTION SPECIMEN BY BRUSH OR WASH;  Surgeon: Luanne Bras, MD;  Location[facility];  Service: Gastroenterology    PR COLONOSCOPY W/BIOPSY SINGLE/MULTIPLE  02/27/2022    Procedure: COLONOSCOPY, FLEXIBLE, PROXIMAL TO SPLENIC FLEXURE; WITH BIOPSY, SINGLE OR MULTIPLE;  Surgeon: Luanne Bras, MD;  Location: [facility];  Service: Gastroenterology    PR COLSC FLX W/RMVL OF TUMOR POLYP LESION SNARE TQ Left 02/27/2022    Procedure: COLONOSCOPY FLEX; W/REMOV TUMOR/LES BY SNARE;  Surgeon: Luanne Bras, MD;  Location: [facility];  Service: Gastroenterology    PR VITRECTOMY,PANRETINAL LASER RX Right 11/26/2016    Procedure: VITRECTOMY, MECHANICAL, PARS PLANA APPROACH; WITH ENDOLASER PANRETINAL PHOTOCOAGULATION;  Surgeon: Melvyn Novas, MD;  Location: [facility] OR [facility];  Service: Ophthalmology    PR Hessie Knows LASER RX Left 01/28/2017    Procedure: VITRECTOMY, MECHANICAL, PARS PLANA APPROACH; WITH ENDOLASER PANRETINAL PHOTOCOAGULATION;  Surgeon: Melvyn Novas, MD;  Location: [facility] OR [facility];  Service: Ophthalmology    SKIN BIOPSY      VASECTOMY        Autoimmune history  No.    FAMILY HISTORY:   His family history includes Cancer in his father; Diabetes in his maternal grandmother and mother; Hypertension in his mother; Kidney disease in his father, maternal grandmother, and mother; Ulcers in his father.  He He indicated that his mother is deceased. He indicated that his father is deceased. He indicated that the status of his maternal grandmother is unknown. He indicated that the status of his neg hx is unknown.        SOCIAL HISTORY:     Social History     Tobacco Use    Smoking status: Never     Passive exposure: Never    Smokeless tobacco: Never   Substance Use Topics    Alcohol use: No     Social History     Social History Narrative    Works for the city of [employer].    Retired from the [employer].      Married.      Two healthy children.       Social Determinants of Health     Financial Resource Strain: Low Risk  (10/03/2022)    Overall Financial Resource Strain (CARDIA)     Difficulty of Paying Living Expenses: Not hard at all   Internet Connectivity: Not on file   Food Insecurity: No Food Insecurity (07/19/2020)    Hunger Vital Sign     Worried About Running Out of Food in the Last Year: Never true     Ran Out of Food in the Last Year: Never true   Tobacco Use: Low Risk  (10/03/2022)    Patient History     Smoking Tobacco Use: Never     Smokeless Tobacco Use: Never     Passive Exposure: Never   Housing/Utilities:  Low Risk  (07/19/2020)    Housing/Utilities     Within the past 12 months, have you ever stayed: outside, in a car, in a tent, in an overnight shelter, or temporarily in someone else's home (i.e. couch-surfing)?: No     Are you worried about losing your housing?: No     Within the past 12 months, have you been unable to get utilities (heat, electricity) when it was really needed?: No   Alcohol Use: Not At Risk (07/19/2020)    Alcohol Use     How often do you have a drink containing alcohol?: Never     How many drinks containing alcohol do you have on a typical day when you are drinking?: 1 - 2     How often do you have 5 or more drinks on one occasion?: Never   Transportation Needs: No Transportation Needs (10/03/2022)    PRAPARE - Transportation     Lack of Transportation (Medical): No     Lack of Transportation (Non-Medical): No   Substance Use: Low Risk  (07/19/2020)    Substance Use     Taken prescription drugs for non-medical reasons: Never     Taken illegal drugs: Never     Patient indicated they have taken drugs in the past year for non-medical reasons: Yes, [positive answer(s)]: Not on file   Health Literacy: Low Risk  (07/19/2020)    Health Literacy     : Never   Physical Activity: Sufficiently Active (07/19/2020)    Exercise Vital Sign     Days of Exercise per Week: 5 days     Minutes of Exercise per Session: 30 min   Interpersonal Safety: Not on file   Stress: No Stress Concern Present (07/19/2020)    [employer] of Occupational Health - Occupational Stress Questionnaire     Feeling of Stress : Only a little   Intimate Partner Violence: Not At Risk (07/19/2020)    Humiliation, Afraid, Rape, and Kick questionnaire     Fear of Current or Ex-Partner: No     Emotionally Abused: No     Physically Abused: No     Sexually Abused: No   Depression: At risk (08/21/2021)    PHQ-2     PHQ-2 Score: 4   Social Connections: Moderately Integrated (07/19/2020)    Social Connection and Isolation Panel [NHANES]     Frequency of Communication with Friends and Family: More than three times a week     Frequency of Social Gatherings with Friends and Family: Once a week     Attends Religious Services: 1 to 4 times per year     Active Member of [employer] or Organizations: No     Attends Banker Meetings: Never     Marital Status: Married       MEDICATIONS AND ALLERGIES:     Current Outpatient Medications   Medication Sig Dispense Refill    acetaminophen (TYLENOL) 325 MG tablet Take by mouth.      aspirin (ECOTRIN) 81 MG tablet Take 1 tablet (81 mg total) by mouth daily as needed (when flying).      blood-glucose meter kit Use as instructed. 1 each 0    cholecalciferol, vitamin D3-50 mcg, 2,000 unit,, 50 mcg (2,000 unit) tablet Take 1 tablet (50 mcg total) by mouth daily.      empagliflozin (JARDIANCE) 10 mg tablet Take 0.5 tablets (5 mg total) by mouth every morning.      flash glucose sensor (FREESTYLE  LIBRE 14 DAY SENSOR) kit by Other route every fourteen (14) days. 2 each 11    furosemide (LASIX) 40 MG tablet Take 1 tablet (40 mg total) by mouth daily. 40 mg daily. And 1 additional tablet for edema.      gabapentin (NEURONTIN) 300 MG capsule TAKE 1 CAPSULE BY MOUTH TWICE DAILY AS DIRECTED 180 capsule 3    HUMALOG KWIKPEN INSULIN 100 unit/mL injection pen INJECT 15-25 UNITS WITH MEALS + CORRECTION **MAX 100 UNITS A DAY** 60 mL 3    hydrocortisone 2.5 % cream       insulin degludec 200 unit/mL (3 mL) InPn Inject 0.4 mL (80 Units total) under the skin daily. Titrate up to 100units daily as instructed. (Patient taking differently: Inject 0.325 mL (65 Units total) under the skin daily. Titrate up to 100units daily as instructed.) 45 mL 3    ketoconazole (NIZORAL) 2 % cream       LOKELMA 10 gram PwPk packet DOSE: 1 PACKET (10 grams) every other day.  Empty entire contents of ONE packet into a glass with 45 mL of water. Stir well and drink immediately; if powder remains in the glass, add water, stir and drink immediately; repeat until no powder remains. (Patient taking differently: DOSE: 1 PACKET (10 grams) every other day.  Empty entire contents of ONE packet into a glass with 45 mL of water. Stir well and drink immediately; if powder remains in the glass, add water, stir and drink immediately; repeat until no powder remains.) 11 packet 0    losartan (COZAAR) 25 MG tablet TAKE 1 TABLET BY MOUTH DAILY. 90 tablet 3    magnesium chloride (SLOW_MAG) 64 mg TbEC Take 128 mg by mouth three (3) times a day (at 6am, noon and 6pm). (2 tablets) 9am 1 pm and 9 pm      metoPROLOL succinate (TOPROL-XL) 100 MG 24 hr tablet TAKE ONE TABLET TWICE DAILY 180 tablet 1    miscellaneous medical supply Misc 1 application by Miscellaneous route daily as needed. Lightweight wheelchair. 1 each 0    miscellaneous medical supply Misc 1 application by Miscellaneous route nightly. Recommend CPAP 14 cm H2O with EPR 3 and heated humidifier for nasal dryness, mask: ResMed Activa Lt nasal mask (Large). 1 each 0    multivitamin capsule Take 1 capsule by mouth daily.      MYFORTIC 180 mg EC tablet Take 3 tablets (540 mg total) by mouth two (2) times a day. 540 tablet 3    PEN NEEDLE 32 gauge x 5/32 (4 mm) Ndle USE 3 TIMES DAILY BEFORE MEALS 300 each 3    polyethylene glycol (MIRALAX) 17 gram packet Take 17 g by mouth daily as needed (constipation). 30 packet 0    pravastatin (PRAVACHOL) 40 MG tablet Take 1 tablet (40 mg total) by mouth daily. 90 tablet 3    tacrolimus (PROGRAF) 1 MG capsule Take 3 capsules (3 mg total) by mouth in the morning AND 2 capsules (2 mg total) in the evening. 450 capsule 3     No current facility-administered medications for this visit.     Facility-Administered Medications Ordered in Other Visits   Medication Dose Route Frequency Provider Last Rate Last Admin    bevacizumab    PRN (once a day) Waunita Schooner Margaree Mackintosh, MD   2.5 mg at 01/28/17 0750     He is allergic to penicillin g; enalapril; epinephrine; grass pollen-bermuda, standard; levofloxacin; lisinopril; mepivacaine hcl; and penicillins.    REVIEW OF  SYSTEMS:   Review of Systems   Constitutional:  Positive for weight loss. Negative for fever.   Respiratory:  Positive for shortness of breath.    Gastrointestinal:  Negative for blood in stool and constipation.   Genitourinary: Negative for dysuria, frequency, hematuria and urgency.   Neurological:  Negative for tingling.   Psychiatric/Behavioral:  The patient does not have insomnia.          PHYSICAL EXAM:   Vital Signs for this encounter reviewed in EPIC including:   BP 137/63  - Pulse 64  - Temp 36.4 ??C (97.6 ??F) (Temporal)  - Resp 16  - Ht 185.4 cm (6' 1)  - Wt (!) 108.6 kg (239 lb 8 oz)  - SpO2 95%  - BMI 31.60 kg/m??   ECOG Performance Status:  2  General:  Alert and oriented, in no acute distress  HEENT:  [place] AT. Conjunctivae and sclerae clear without icterus.   Back:  No spinal or CVA tenderness to palpation  Respiratory:  Clear to auscultation bilaterally, with normal work of breathing and no wheezing or crackles audible.  Heart:  Regular rate and rhythm, S1, S2 normal, no murmur  Full body skin exam:  Seborrheic Keratoses throughout his back  Lymph Nodes:  No palpable cervical, supraclavicular, axillary or inguinal adenopathy.  Gastrointestinal:  Soft, non-tender, bowel sounds normal, no masses palpable, no organomegaly, no rigidity. Negative fluid wave.   Extremities:  No cyanosis or edema, warm and well-perfused.  Neurologic:  Cranial nerves II-XII grossly intact bilaterally, alert and oriented X 3, thought content appropriate, normal coordination and steady gait,      DIAGNOSTIC STUDIES:   Imaging  Imaging studies of the abdomen were personally reviewed  and interpreted by me and I agree with the radiologist assessment  Pathology  Molecular Genetics    LABORATORY DATA:   Results of each test were reviewed and analyzed as part of this visit     Lab Results   Component Value Date/Time    WBC 9.7 10/08/2022 12:04 PM    WBC 7.7 04/21/2015 08:58 AM    WBC 5.9 11/29/2014 12:00 AM    HGB 13.6 10/08/2022 12:04 PM    HGB 14.9 04/21/2015 08:58 AM    HCT 40.3 10/08/2022 12:04 PM    HCT 45.5 04/21/2015 08:58 AM    PLT 246 10/08/2022 12:04 PM    PLT 272 04/21/2015 08:58 AM    MCV 83.5 10/08/2022 12:04 PM    MCV 80 04/21/2015 08:58 AM    LDH 200 10/08/2022 12:04 PM    LDH 573 08/20/2012 07:00 AM    NA 142 10/08/2022 12:04 PM    NA 139 07/09/2017 01:58 PM    K 5.5 (H) 10/08/2022 12:04 PM    K 4.6 07/09/2017 01:58 PM    CREATININE 1.26 (H) 10/08/2022 12:04 PM    CREATININE 1.7 (H) 07/09/2017 01:58 PM    BUN 24 (H) 10/08/2022 12:04 PM    BUN 49.00 (H) 07/09/2017 01:58 PM    GLU 165 10/08/2022 12:04 PM    MG 2.0 10/02/2022 09:11 AM    MG 2.1 04/21/2015 08:58 AM    AST 16 10/08/2022 12:04 PM    AST 24 04/21/2015 08:58 AM    ALT 9 (L) 10/08/2022 12:04 PM    ALT 22 04/21/2015 08:58 AM       CT ABD and PELVIS 10/02/2022    Impression      --Left upper quadrant abdominal mass measuring 5.3  x 3.7 x 3.7 cm. The mass is in the area of the pancreatic tail and a pancreatic neoplasm is favored. It abuts the adjacent stomach and proximal jejunum and gastric/enteric neoplasm cannot be entirely excluded. Lack of contrast limits evaluation. Nonemergent MRI of the abdomen is recommended.      --Abdominal lymphadenopathy and left adrenal nodule which is suspicious for metastasis.      -- Partially visualized solid pulmonary nodules and right pleural effusion. This may be infectious or inflammatory or metastatic.      -- Layering fluid in the right paracolic gutter, nonspecific     Narrative   EXAM: CT ABDOMEN PELVIS WO CONTRAST   ACCESSION: [id number] UN   CLINICAL INDICATION: 77 years old with L sided abd pain x 2 weeks, h/o kidney transplant no contrast        COMPARISON: None.      TECHNIQUE: A spiral CT scan was obtained without IV contrast from the lung bases to the pubic symphysis.  Images were reconstructed in the axial plane. Coronal and sagittal reformatted images were also provided for further evaluation.      Evaluation of the solid organs and vasculature is limited in the absence of intravenous contrast.         FINDINGS:      LOWER CHEST: Multiple solid nodules in the visualized lower lungs, for example middle lobe perifissural measuring 0.6 cm (2:10). Right pleural effusion.      LIVER: Normal liver contour. No focal liver lesion on non-contrast examination.      BILIARY: Cholelithiasis. No intrahepatic biliary ductal dilatation.      SPLEEN: Normal in size and contour.      PANCREAS: Mass in the area of the pancreatic tail measuring 5.3 x 3.7 x 3.7 cm (2:52, 4:70). The mass abuts the inferior border of the greater curvature of the stomach and the superior border of the proximal jejunum. Crescentic fat density is seen along the anterior portion of the mass (2:49), possibly within the mass or adjacent mesenteric fat. No gross ductal dilatation.      ADRENAL GLANDS: Left adrenal nodule measuring 2.8 cm. The right adrenal gland is normal.      KIDNEYS/URETERS: Atrophic native kidneys with subcentimeter lesions too small to characterize. Right lower quadrant renal transplant. No hydronephrosis. No nephrolithiasis. paracolic gutter,      BLADDER: Unremarkable.      REPRODUCTIVE ORGANS: Unremarkable.      GI TRACT: No findings of bowel obstruction or acute inflammation. Normal appendix (2:94).      PERITONEUM/RETROPERITONEUM AND MESENTERY: No free air. The inferior right paracolic gutter,. No fluid collection.      VASCULATURE: Atherosclerotic calcifications of the abdominal aorta and branch vessels. Normal caliber aorta. Otherwise, limited evaluation without contrast.      LYMPH NODES: Enlarged porta hepatis, periaortic, and aortocaval lymph nodes with a reference aortocaval node measuring 2.1 cm (2:66).      BONES and SOFT TISSUES: No aggressive osseous lesions. No focal soft tissue lesions.                   I saw and evaluated the patient, participating in all portions of the service.  I reviewed Dr Rennie Plowman note.  I agree with his findings and plan.     Midge Aver, MD   This note is assisted with Office manager and it may have typographical or grammatical errors.

## 2022-10-08 ENCOUNTER — Ambulatory Visit: Admit: 2022-10-08 | Discharge: 2022-10-09 | Payer: MEDICARE

## 2022-10-08 DIAGNOSIS — Z940 Kidney transplant status: Principal | ICD-10-CM

## 2022-10-08 DIAGNOSIS — K8689 Other specified diseases of pancreas: Principal | ICD-10-CM

## 2022-10-08 DIAGNOSIS — R1902 Left upper quadrant abdominal swelling, mass and lump: Principal | ICD-10-CM

## 2022-10-08 DIAGNOSIS — R791 Abnormal coagulation profile: Principal | ICD-10-CM

## 2022-10-08 LAB — CBC W/ AUTO DIFF
BASOPHILS ABSOLUTE COUNT: 0.1 10*9/L (ref 0.0–0.1)
BASOPHILS RELATIVE PERCENT: 0.6 %
EOSINOPHILS ABSOLUTE COUNT: 0.1 10*9/L (ref 0.0–0.5)
EOSINOPHILS RELATIVE PERCENT: 0.8 %
HEMATOCRIT: 40.3 % (ref 39.0–48.0)
HEMOGLOBIN: 13.6 g/dL (ref 12.9–16.5)
LYMPHOCYTES ABSOLUTE COUNT: 0.8 10*9/L — ABNORMAL LOW (ref 1.1–3.6)
LYMPHOCYTES RELATIVE PERCENT: 8.1 %
MEAN CORPUSCULAR HEMOGLOBIN CONC: 33.7 g/dL (ref 32.0–36.0)
MEAN CORPUSCULAR HEMOGLOBIN: 28.2 pg (ref 25.9–32.4)
MEAN CORPUSCULAR VOLUME: 83.5 fL (ref 77.6–95.7)
MEAN PLATELET VOLUME: 7.5 fL (ref 6.8–10.7)
MONOCYTES ABSOLUTE COUNT: 0.7 10*9/L (ref 0.3–0.8)
MONOCYTES RELATIVE PERCENT: 6.7 %
NEUTROPHILS ABSOLUTE COUNT: 8.1 10*9/L — ABNORMAL HIGH (ref 1.8–7.8)
NEUTROPHILS RELATIVE PERCENT: 83.8 %
PLATELET COUNT: 246 10*9/L (ref 150–450)
RED BLOOD CELL COUNT: 4.82 10*12/L (ref 4.26–5.60)
RED CELL DISTRIBUTION WIDTH: 14.8 % (ref 12.2–15.2)
WBC ADJUSTED: 9.7 10*9/L (ref 3.6–11.2)

## 2022-10-08 LAB — PROTIME-INR
INR: 1.43
PROTIME: 15.9 s — ABNORMAL HIGH (ref 9.9–12.6)

## 2022-10-08 LAB — COMPREHENSIVE METABOLIC PANEL
ALBUMIN: 3.1 g/dL — ABNORMAL LOW (ref 3.4–5.0)
ALKALINE PHOSPHATASE: 81 U/L (ref 46–116)
ALT (SGPT): 9 U/L — ABNORMAL LOW (ref 10–49)
ANION GAP: 5 mmol/L (ref 5–14)
AST (SGOT): 16 U/L (ref <=34)
BILIRUBIN TOTAL: 0.7 mg/dL (ref 0.3–1.2)
BLOOD UREA NITROGEN: 24 mg/dL — ABNORMAL HIGH (ref 9–23)
BUN / CREAT RATIO: 19
CALCIUM: 8.9 mg/dL (ref 8.7–10.4)
CHLORIDE: 113 mmol/L — ABNORMAL HIGH (ref 98–107)
CO2: 24 mmol/L (ref 20.0–31.0)
CREATININE: 1.26 mg/dL — ABNORMAL HIGH
EGFR CKD-EPI (2021) MALE: 59 mL/min/{1.73_m2} — ABNORMAL LOW (ref >=60)
GLUCOSE RANDOM: 165 mg/dL (ref 70–179)
POTASSIUM: 5.5 mmol/L — ABNORMAL HIGH (ref 3.4–4.8)
PROTEIN TOTAL: 6.1 g/dL (ref 5.7–8.2)
SODIUM: 142 mmol/L (ref 135–145)

## 2022-10-08 LAB — LACTATE DEHYDROGENASE: LACTATE DEHYDROGENASE: 200 U/L (ref 120–246)

## 2022-10-09 DIAGNOSIS — K8689 Other specified diseases of pancreas: Principal | ICD-10-CM

## 2022-10-09 MED ORDER — PROCHLORPERAZINE MALEATE 10 MG TABLET
ORAL_TABLET | Freq: Four times a day (QID) | ORAL | 0 refills | 8 days | Status: CP | PRN
Start: 2022-10-09 — End: 2022-11-08

## 2022-10-09 NOTE — Unmapped (Signed)
Appointment on May 13 changed to video.

## 2022-10-09 NOTE — Unmapped (Signed)
I spoke with patine    Next Monday, can you change his appointment to VIDEO?      [facility] CT [facility] the [facility].    Tomorrow, he will restart coumadin at 2.5 mg daily.      [name], M.D.     [facility] at [facility]   [address]  [address]  [place], [place]  [address]  [phone]

## 2022-10-10 DIAGNOSIS — K8689 Other specified diseases of pancreas: Principal | ICD-10-CM

## 2022-10-11 MED ORDER — ENOXAPARIN 120 MG/0.8 ML SUBCUTANEOUS SYRINGE
Freq: Two times a day (BID) | SUBCUTANEOUS | 1 refills | 7 days | Status: CP
Start: 2022-10-11 — End: ?

## 2022-10-11 NOTE — Unmapped (Signed)
-----   Message -----  From: Sharlee Blew, MD  Sent: 10/11/2022   8:53 AM EDT  To: Mahlon Gammon Brinleigh Tew, RN; *  Subject: RE: updated orders needed                        I commented to Dr. Dan Europe that what she outlined was perfect.  Do you need scripts from me?    [name], M.D.     [facility] Internal Medicine at [facility]   [address]  [address]  [place], [place]  [address]  [phone]       -- Given the patient's CHA2DS2-VASc score of 7 and low bleeding risk for the biopsy procedure, would recommend discontinuing warfarin with bridging with the Lovenox bridging plan previously utilized for his colonoscopy.     --- 5 days before the procedure: Discontinue warfarin.   --- 3 days before the procedure: Initiate Lovenox 110 mg subcutaneously twice daily.   --- On the morning of the procedure: Do not administer Lovenox or warfarin.  --- On the night of the procedure: Resume warfarin at the prior dosage.   --- The morning after the procedure: Restart Lovenox 110 mg subcutaneously twice daily until INR reaches 2.0.   --- Check PT/INR 4 days after restarting warfarin.   --- Upon achieving an INR of 2.0 or greater: Discontinue Lovenox.

## 2022-10-11 NOTE — Unmapped (Signed)
Spoke with patient.    As we just spoke on the phone.    We have restarted your coumadin at a lower dose (2.5 mg daily instead of 5 mg daily) due to the high high INR recently.    As well, you have just been scheduled for endoscopic ultrasound with probably biopsy on May 15.      And the recommendations are as follows:    -- Given the patient's CHA2DS2-VASc score of 7 and low bleeding risk for the biopsy procedure, would recommend discontinuing warfarin with bridging with the Lovenox bridging plan previously utilized for his colonoscopy.   --- 5 days before the procedure: Discontinue warfarin.   --- 3 days before the procedure: Initiate Lovenox 110 mg subcutaneously twice daily.   --- On the morning of the procedure: Do not administer Lovenox or warfarin.   --- On the night of the procedure: Resume warfarin at the prior dosage.   --- The morning after the procedure: Restart Lovenox 110 mg subcutaneously twice daily until INR reaches 2.0.   --- Check PT/INR 4 days after restarting warfarin.   --- Upon achieving an INR of 2.0 or greater: Discontinue Lovenox.     THEREFORE:    Take coumadin tonight (2.5 mg)   STop coumadin tomorrow (may 10) and remain off of coumadin until after the procedure.  On Sunday May 12, start lovenox 110 mg subcutaneous twice a day every day  The morning of the procedure (May 15) do NOT take your lovenox.      May 15, procedure:      Assuming the procedure goes well and no bleeding, then the evening of the procedure (may 15), restart your coumadin at 2.5 mg daily (lower dose than prior)  Assuming the procedure goes well and no bleeding (the proceduralist will tell you if a problem), restart the lovenox 110 mg twice a day.    Check PT/INR on May 19.    Once INR hits 2, we'll stop the lovenox.    I have sent script for lovenox 14 doses (7 days) to your pharmacy with a refill.      Let me know if further questions.    [name], M.D.     [facility] Internal Medicine at [facility]   [address]  [address]  [place], [place]  [address]  [phone]

## 2022-10-11 NOTE — Unmapped (Signed)
Received call from [facility] for clarification on patient's lovenox prescription. They want to confirm if the quantity is for 10mL, or for 10 syringes. Please advise.

## 2022-10-11 NOTE — Unmapped (Signed)
Spoke to [place] at [facility] regarding lovenox dosage as stated per Dr. Dellis Filbert. Prescription received by pharmacy reads 105 mg dose (0.94mL) every 12 hours, which may change total quantity needed. Also stated Lovenox comes in 0.42mL syringes. Phone number for [facility] is [phone].

## 2022-10-11 NOTE — Unmapped (Signed)
He needs dosing for 110 mg shots  Take twice a day for seven days.  This means 14 doses.      I thought I estimated 10 ml, but please check with them.  If needed, I'm happy to speak with them please get phone number.    [name], M.D.     [facility] Internal Medicine at [facility]   [address]  [address]  [place], [place]  [address]  [phone]

## 2022-10-12 MED ORDER — ENOXAPARIN 120 MG/0.8 ML SUBCUTANEOUS SYRINGE
1 refills | 0 days
Start: 2022-10-12 — End: ?

## 2022-10-12 NOTE — Unmapped (Signed)
Sherry from [facility] called, is wanting to verify the number of doses patient is to receive of lovenox 105mg  to be given every 12 hours. Four doses has been given to patient for yesterday and today, but they wanted to verify how many doses the patient is to receive total for how many days. There is also 1 refill listed on prescription, they wanted clarification if there is to be a refill on this medication or not. [facility] number is [phone].

## 2022-10-12 NOTE — Unmapped (Signed)
I spoke with Cordelia Pen and they have the specific directions.  105 mg twice a day X 7 days. 14 doses.  1 refill    [name], M.D.     [facility] Internal Medicine at [facility]   [address]  [address]  [place], [place]  [address]  [phone]

## 2022-10-12 NOTE — Unmapped (Signed)
I spoke with pharmacy and confirmed with them.    [name], M.D.     [facility] Internal Medicine at [facility]   [address]  [address]  [place], [place]  [address]  [phone]

## 2022-10-15 ENCOUNTER — Telehealth: Admit: 2022-10-15 | Discharge: 2022-10-16 | Payer: MEDICARE

## 2022-10-15 DIAGNOSIS — I4891 Unspecified atrial fibrillation: Principal | ICD-10-CM

## 2022-10-15 DIAGNOSIS — Z940 Kidney transplant status: Principal | ICD-10-CM

## 2022-10-15 MED ORDER — MYFORTIC 180 MG TABLET,DELAYED RELEASE
ORAL_TABLET | Freq: Two times a day (BID) | ORAL | 3 refills | 90.00000 days | Status: CP
Start: 2022-10-15 — End: 2022-10-15
  Filled 2022-10-18: qty 60, 30d supply, fill #0

## 2022-10-15 NOTE — Unmapped (Addendum)
5/14 update: coordinator LF verified myfortic dosing is now 180mg  bid, she has let patient and wife know -ef    5/13: patient and wife unaware of dosage change myfortic rx sent in today. Messaged clinic to verify dosing and that patient has been notified -Massie Maroon    [facility] Clinical Assessment & Refill Coordination Note    David Ellis, DOB: 11/03/1945  Phone: [phone] (home)     All above HIPAA information was verified with patient.     Was a Nurse, learning disability used for this call? No    Specialty Medication(s):   Transplant: Myfortic 180mg  and tacrolimus 1mg      Current Outpatient Medications   Medication Sig Dispense Refill    acetaminophen (TYLENOL) 325 MG tablet Take by mouth.      aspirin (ECOTRIN) 81 MG tablet Take 1 tablet (81 mg total) by mouth daily as needed (when flying).      blood-glucose meter kit Use as instructed. 1 each 0    cholecalciferol, vitamin D3-50 mcg, 2,000 unit,, 50 mcg (2,000 unit) tablet Take 1 tablet (50 mcg total) by mouth daily.      empagliflozin (JARDIANCE) 10 mg tablet Take 0.5 tablets (5 mg total) by mouth every morning.      enoxaparin (LOVENOX) 120 mg/0.8 mL Syrg Inject 0.7 mL (105 mg total) under the skin every twelve (12) hours. 10 mL 1    flash glucose sensor (FREESTYLE LIBRE 14 DAY SENSOR) kit by Other route every fourteen (14) days. 2 each 11    furosemide (LASIX) 40 MG tablet Take 1 tablet (40 mg total) by mouth daily. 40 mg daily. And 1 additional tablet for edema.      gabapentin (NEURONTIN) 300 MG capsule TAKE 1 CAPSULE BY MOUTH TWICE DAILY AS DIRECTED 180 capsule 3    HUMALOG KWIKPEN INSULIN 100 unit/mL injection pen INJECT 15-25 UNITS WITH MEALS + CORRECTION **MAX 100 UNITS A DAY** 60 mL 3    hydrocortisone 2.5 % cream       insulin degludec 200 unit/mL (3 mL) InPn Inject 0.4 mL (80 Units total) under the skin daily. Titrate up to 100units daily as instructed. (Patient taking differently: Inject 0.325 mL (65 Units total) under the skin daily. Titrate up to 100units daily as instructed.) 45 mL 3    ketoconazole (NIZORAL) 2 % cream       LOKELMA 10 gram PwPk packet DOSE: 1 PACKET (10 grams) every other day.  Empty entire contents of ONE packet into a glass with 45 mL of water. Stir well and drink immediately; if powder remains in the glass, add water, stir and drink immediately; repeat until no powder remains. (Patient taking differently: DOSE: 1 PACKET (10 grams) every other day.  Empty entire contents of ONE packet into a glass with 45 mL of water. Stir well and drink immediately; if powder remains in the glass, add water, stir and drink immediately; repeat until no powder remains.) 11 packet 0    losartan (COZAAR) 25 MG tablet TAKE 1 TABLET BY MOUTH DAILY. 90 tablet 3    magnesium chloride (SLOW_MAG) 64 mg TbEC Take 128 mg by mouth three (3) times a day (at 6am, noon and 6pm). (2 tablets) 9am 1 pm and 9 pm      metoPROLOL succinate (TOPROL-XL) 100 MG 24 hr tablet TAKE ONE TABLET TWICE DAILY 180 tablet 1    miscellaneous medical supply Misc 1 application by Miscellaneous route daily as needed. Lightweight wheelchair. 1  each 0    miscellaneous medical supply Misc 1 application by Miscellaneous route nightly. Recommend CPAP 14 cm H2O with EPR 3 and heated humidifier for nasal dryness, mask: ResMed Activa Lt nasal mask (Large). 1 each 0    multivitamin capsule Take 1 capsule by mouth daily.      MYFORTIC 180 mg EC tablet Take 2 tablets (360 mg total) by mouth two (2) times a day. 360 tablet 3    PEN NEEDLE 32 gauge x 5/32 (4 mm) Ndle USE 3 TIMES DAILY BEFORE MEALS 300 each 3    polyethylene glycol (MIRALAX) 17 gram packet Take 17 g by mouth daily as needed (constipation). 30 packet 0    pravastatin (PRAVACHOL) 40 MG tablet Take 1 tablet (40 mg total) by mouth daily. 90 tablet 3    prochlorperazine (COMPAZINE) 10 MG tablet Take 1 tablet (10 mg total) by mouth every six (6) hours as needed for nausea. 30 tablet 0    tacrolimus (PROGRAF) 1 MG capsule Take 3 capsules (3 mg total) by mouth in the morning AND 2 capsules (2 mg total) in the evening. 450 capsule 3     No current facility-administered medications for this visit.     Facility-Administered Medications Ordered in Other Visits   Medication Dose Route Frequency Provider Last Rate Last Admin    bevacizumab    PRN (once a day) Melvyn Novas, MD   2.5 mg at 01/28/17 0750        Changes to medications: David Ellis reports no changes at this time.    Allergies   Allergen Reactions    Penicillin G Other (See Comments)    Enalapril Cough    Epinephrine Palpitations    Grass Pollen-[place], Standard Itching    Levofloxacin Other (See Comments)     Per patient    Lisinopril Rash    Mepivacaine Hcl Palpitations    Penicillins Other (See Comments)     As child  Tolerated amp/sulbactam without side effects during 10/2014 hospitalization       Changes to allergies: No    SPECIALTY MEDICATION ADHERENCE     Myfortic 180mg   : 7 days of medicine on hand   Tacrolimus 1mg   : 7 days of medicine on hand       Medication Adherence    Patient reported X missed doses in the last month: 0  Specialty Medication: myfortic 180mg   Patient is on additional specialty medications: Yes  Additional Specialty Medications: Tacrolimus 1mg   Patient Reported Additional Medication X Missed Doses in the Last Month: 0  Adherence tools used: patient uses a pill box to manage medications  Support network for adherence: family member          Specialty medication(s) dose(s) confirmed: Patient reports changes to the regimen as follows: new rx sent in for myfortic 360mg  bid, however patient and wife unaware. Have reached out to clinic to clarify and verify they will followup with patient if any changes      Are there any concerns with adherence? No    Adherence counseling provided? Not needed    CLINICAL MANAGEMENT AND INTERVENTION      Clinical Benefit Assessment:    Do you feel the medicine is effective or helping your condition? Yes    Clinical Benefit counseling provided? Not needed    Adverse Effects Assessment:    Are you experiencing any side effects? No    Are you experiencing difficulty administering your medicine? No  Quality of Life Assessment:    Quality of Life    Rheumatology  Oncology  Dermatology  Cystic Fibrosis          How many days over the past month did your transplant  keep you from your normal activities? For example, brushing your teeth or getting up in the morning. 0    Have you discussed this with your provider? Not needed    Acute Infection Status:    Acute infections noted within Epic:  No active infections  Patient reported infection: None    Therapy Appropriateness:    Is therapy appropriate and patient progressing towards therapeutic goals? Yes, therapy is appropriate and should be continued    DISEASE/MEDICATION-SPECIFIC INFORMATION      N/A    Solid Organ Transplant: Not Applicable    PATIENT SPECIFIC NEEDS     Does the patient have any physical, cognitive, or cultural barriers? No    Is the patient high risk? Yes, patient is taking a REMS drug. Medication is dispensed in compliance with REMS program    Did the patient require a clinical intervention? No    Does the patient require physician intervention or other additional services (i.e., nutrition, smoking cessation, social work)? No    SOCIAL DETERMINANTS OF HEALTH     At the [facility], we have learned that life circumstances - like trouble affording food, housing, utilities, or transportation can affect the health of many of our patients.   That is why we wanted to ask: are you currently experiencing any life circumstances that are negatively impacting your health and/or quality of life? No    Social Determinants of Health     Financial Resource Strain: Low Risk  (10/03/2022)    Overall Financial Resource Strain (CARDIA)     Difficulty of Paying Living Expenses: Not hard at all   Internet Connectivity: Not on file   Food Insecurity: No Food Insecurity (07/19/2020) Hunger Vital Sign     Worried About Running Out of Food in the Last Year: Never true     Ran Out of Food in the Last Year: Never true   Tobacco Use: Low Risk  (10/03/2022)    Patient History     Smoking Tobacco Use: Never     Smokeless Tobacco Use: Never     Passive Exposure: Never   Housing/Utilities: Low Risk  (07/19/2020)    Housing/Utilities     Within the past 12 months, have you ever stayed: outside, in a car, in a tent, in an overnight shelter, or temporarily in someone else's home (i.e. couch-surfing)?: No     Are you worried about losing your housing?: No     Within the past 12 months, have you been unable to get utilities (heat, electricity) when it was really needed?: No   Alcohol Use: Not At Risk (07/19/2020)    Alcohol Use     How often do you have a drink containing alcohol?: Never     How many drinks containing alcohol do you have on a typical day when you are drinking?: 1 - 2     How often do you have 5 or more drinks on one occasion?: Never   Transportation Needs: No Transportation Needs (10/03/2022)    PRAPARE - Transportation     Lack of Transportation (Medical): No     Lack of Transportation (Non-Medical): No   Substance Use: Low Risk  (07/19/2020)    Substance Use     Taken  prescription drugs for non-medical reasons: Never     Taken illegal drugs: Never     Patient indicated they have taken drugs in the past year for non-medical reasons: Yes, [positive answer(s)]: Not on file   Health Literacy: Low Risk  (07/19/2020)    Health Literacy     : Never   Physical Activity: Sufficiently Active (07/19/2020)    Exercise Vital Sign     Days of Exercise per Week: 5 days     Minutes of Exercise per Session: 30 min   Interpersonal Safety: Not on file   Stress: No Stress Concern Present (07/19/2020)    [employer] of Occupational Health - Occupational Stress Questionnaire     Feeling of Stress : Only a little   Intimate Partner Violence: Not At Risk (07/19/2020)    Humiliation, Afraid, Rape, and Kick questionnaire     Fear of Current or Ex-Partner: No     Emotionally Abused: No     Physically Abused: No     Sexually Abused: No   Depression: At risk (08/21/2021)    PHQ-2     PHQ-2 Score: 4   Social Connections: Moderately Integrated (07/19/2020)    Social Connection and Isolation Panel [NHANES]     Frequency of Communication with Friends and Family: More than three times a week     Frequency of Social Gatherings with Friends and Family: Once a week     Attends Religious Services: 1 to 4 times per year     Active Member of [employer] or Organizations: No     Attends Banker Meetings: Never     Marital Status: Married       Would you be willing to receive help with any of the needs that you have identified today? Not applicable       SHIPPING     Specialty Medication(s) to be Shipped:   N/a    Other medication(s) to be shipped: No additional medications requested for fill at this time     Changes to insurance: No    Delivery Scheduled:  delivery already set up by another team member      Medication will be delivered via  n/a'  to the confirmed  n/a  address in [place].    The patient will receive a drug information handout for each medication shipped and additional FDA Medication Guides as required.  Verified that patient has previously received a Conservation officer, historic buildings and a Surveyor, mining.    The patient or caregiver noted above participated in the development of this care plan and knows that they can request review of or adjustments to the care plan at any time.      All of the patient's questions and concerns have been addressed.    [name], PharmD   [facility] Specialty Pharmacist

## 2022-10-15 NOTE — Unmapped (Signed)
Clinical Assessment Needed For: Dose Change  Medication: Myfortic 180 mg tablets  Last Fill Date/Day Supply: 09/24/22 / 30  Copay $0  Was previous dose already scheduled to fill: Yes    Notes to Pharmacist: Previous dose scheduled to fill 5/16.

## 2022-10-15 NOTE — Unmapped (Signed)
[facility] Refill Coordination Note    Specialty Medication(s) to be Shipped:   Transplant: Myfortic 180 mg and tacrolimus 1mg     Other medication(s) to be shipped: No additional medications requested for fill at this time     David Ellis, DOB: 04-25-1946  Phone: [phone] (home)       All above HIPAA information was verified with patient.     Was a Nurse, learning disability used for this call? No    Completed refill call assessment today to schedule patient's medication shipment from the [facility] [phone]).  All relevant notes have been reviewed.     Specialty medication(s) and dose(s) confirmed: Regimen is correct and unchanged.   Changes to medications: David Ellis reports no changes at this time.  Changes to insurance: No  New side effects reported not previously addressed with a pharmacist or physician: None reported  Questions for the pharmacist: No    Confirmed patient received a Conservation officer, historic buildings and a Surveyor, mining with first shipment. The patient will receive a drug information handout for each medication shipped and additional FDA Medication Guides as required.       DISEASE/MEDICATION-SPECIFIC INFORMATION        N/A    SPECIALTY MEDICATION ADHERENCE     Medication Adherence    Patient reported X missed doses in the last month: 0  Specialty Medication: MYFORTIC 180 mg EC tablet (mycophenolate)  Patient is on additional specialty medications: Yes  Additional Specialty Medications: tacrolimus 1 MG capsule (PROGRAF)  Patient Reported Additional Medication X Missed Doses in the Last Month: 0  Patient is on more than two specialty medications: No  Adherence tools used: patient uses a pill box to manage medications  Support network for adherence: family member              Were doses missed due to medication being on hold? No    Myfortic 180 mg: 7 days of medicine on hand   tacrolimus 1 mg: 7 days of medicine on hand       REFERRAL TO PHARMACIST     Referral to the pharmacist: Not needed      [facility]     Shipping address confirmed in Epic.       Delivery Scheduled: Yes, Expected medication delivery date: 10/18/22.     Medication will be delivered via Same Day Courier to the prescription address in Epic WAM.    David Ellis   [facility] Specialty Technician

## 2022-10-15 NOTE — Unmapped (Signed)
Pt request for RX Refill

## 2022-10-15 NOTE — Unmapped (Signed)
Problem List Items Addressed This Visit          High    Atrial fibrillation (CMS-HCC) - Primary    Relevant Orders    Protime-INR

## 2022-10-15 NOTE — Unmapped (Signed)
TeleHealth Video Encounter  This medical encounter was conducted virtually using [email or web address]  TeleHealth protocols.    Patient ID: David Ellis is a 77 y.o. male who presents by video interaction for evaluation of diabetes, anti coagulation, pancreatic cystic lesion.    Present on Video Call: Patient and his wife.    Assessment/Plan:      Cystic mass of pancreas  Noted on CT scan.  Concerning for malignancy and need to rule out.  Pl,anning for EUS biopsy this week.    Discussed plans for his medicaitons today (and wew have sent him specific instructions on mychart, which he and his wife have)  See abofe and see notes for plans.    THese have been discussed with team and everyone is on board.      Type II diabetes mellitus with renal manifestations, uncontrolled  Has been under decent control  WIth upcoming biopsy, planning   NPO that morning  Decresae tresiba from 60 units that morning to 30 and   Hold humalog insulin.    After procedure, when restarts diet,   Restart humalog with meals andn next morning increase tresiba back up to 60 (assuing he is eating well)   If not eating, h'ell reach out to me.    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  Plans have been forwardd to him.  Restart coumadin after the procedure with low dose 2.5 mg daily (previously on 5) to make sure we don't over do the INR again  Discussed importance of compliance and importanc eof close INR labs.      Preventive services addressed today  We did not review preventive services today    Return in about 4 weeks (around 11/12/2022), or VIDEO.       Subjective:     HPI  I spoke with patient via video appointment.    He reports mild pain in the LUQ over the past month.  This seems to have started when he started ozempic.    Weight loss noted as well.    When the coumadin INR came back at 10, sent to ER.  There he was noted to have continued pain and CT showed pancreatic cystic mass.      RE: the meds, he stopped the coumadin Friday.    Started lovenox yesterday 105 mg twice a day.    Procedure is Wednesday.     Plans to restart coumadin on WEd 2.5 mg daily and   Lovenox 105 mg on Thu morning.     As well, decrease LA insulin 60 units to 30 units the morning of the procedure.         Objective:     As part of this Video Visit, no in-person exam was conducted.  Video interaction permitted the following observations.    General: WEll appearingmale in nad.  RESP: Relaxed respiratory effort.   SKIN: No rashes noted.  NEURO: Normal coordination.  No tremors observed.  PSYCH: Alert and oriented.  Speech fluent and sensible.  Calm affect.           The patient reports they are physically located in [place] and is currently: at home. I conducted a audio/video visit. I spent  22m 36s on the video call with the patient. I spent an additional 3 minutes on pre- and post-visit activities on the date of service .

## 2022-10-16 ENCOUNTER — Ambulatory Visit: Payer: Medicare Other | Admitting: Dermatology

## 2022-10-16 ENCOUNTER — Ambulatory Visit: Admit: 2022-10-16 | Discharge: 2022-10-17 | Payer: MEDICARE

## 2022-10-16 NOTE — Unmapped (Signed)
Noted on CT scan.  Concerning for malignancy and need to rule out.  Pl,anning for EUS biopsy this week.    Discussed plans for his medicaitons today (and wew have sent him specific instructions on mychart, which he and his wife have)  See abofe and see notes for plans.    THese have been discussed with team and everyone is on board.

## 2022-10-16 NOTE — Unmapped (Signed)
Discussed pt's recent health changes w/ Dr Margaretmary Bayley and called pt to advise he decrease myfortic to 180mg  bid. Pt also reports new weight gain, swelling in his abdomen. Dr Margaretmary Bayley advised he restart Lasix at 40mg  daily and touch base again with me later in the week w/ an update on fluid status. Pt and his wife appreciated the call and will make changes starting today.

## 2022-10-16 NOTE — Unmapped (Signed)
Has been under decent control  WIth upcoming biopsy, planning   NPO that morning  Decresae tresiba from 60 units that morning to 30 and   Hold humalog insulin.    After procedure, when restarts diet,   Restart humalog with meals andn next morning increase tresiba back up to 60 (assuing he is eating well)   If not eating, h'ell reach out to me.

## 2022-10-16 NOTE — Unmapped (Signed)
With PE and high CHADS vasc score, we have elected to bridge him with lovenox while off of ocumadin.  Plans have been forwardd to him.  Restart coumadin after the procedure with low dose 2.5 mg daily (previously on 5) to make sure we don't over do the INR again  Discussed importance of compliance and importanc eof close INR labs.

## 2022-10-17 ENCOUNTER — Encounter
Admit: 2022-10-17 | Discharge: 2022-10-17 | Payer: MEDICARE | Attending: Certified Registered" | Primary: Certified Registered"

## 2022-10-17 ENCOUNTER — Ambulatory Visit: Admit: 2022-10-17 | Discharge: 2022-10-17 | Payer: MEDICARE

## 2022-10-17 MED ADMIN — vasopressin (VASOSTRICT) injection: INTRAVENOUS | @ 15:00:00 | Stop: 2022-10-17

## 2022-10-17 MED ADMIN — glycopyrrolate (ROBINUL) injection: INTRAVENOUS | @ 15:00:00 | Stop: 2022-10-17

## 2022-10-17 MED ADMIN — propofol (DIPRIVAN) infusion 10 mg/mL: INTRAVENOUS | @ 15:00:00 | Stop: 2022-10-17

## 2022-10-17 MED ADMIN — phenylephrine 0.8 mg/10 mL (80 mcg/mL) injection: INTRAVENOUS | @ 15:00:00 | Stop: 2022-10-17

## 2022-10-17 MED ADMIN — esmolol (BREVIBLOC) injection: INTRAVENOUS | @ 15:00:00 | Stop: 2022-10-17

## 2022-10-17 MED ADMIN — lidocaine (XYLOCAINE) 20 mg/mL (2 %) injection: INTRAVENOUS | @ 15:00:00 | Stop: 2022-10-17

## 2022-10-17 MED ADMIN — EPINEPHrine 100 mcg/10 mL (10 mcg/mL) injection: INTRAVENOUS | @ 15:00:00 | Stop: 2022-10-17

## 2022-10-18 MED FILL — TACROLIMUS 1 MG CAPSULE, IMMEDIATE-RELEASE: ORAL | 30 days supply | Qty: 150 | Fill #7

## 2022-10-22 ENCOUNTER — Ambulatory Visit: Admit: 2022-10-22 | Discharge: 2022-10-23 | Payer: MEDICARE

## 2022-10-22 LAB — PROTIME-INR
INR: 1.37
PROTIME: 15.1 s — ABNORMAL HIGH (ref 9.9–12.6)

## 2022-10-22 MED ORDER — WARFARIN 2.5 MG TABLET
ORAL_TABLET | Freq: Every day | ORAL | 12 refills | 30 days | Status: CP
Start: 2022-10-22 — End: ?

## 2022-10-22 NOTE — Unmapped (Signed)
Patient is requesting the following refill  Requested Prescriptions     Pending Prescriptions Disp Refills    warfarin (JANTOVEN) 2.5 MG tablet [Pharmacy Med Name: WARFARIN SODIUM 2.5 MG TAB] 30 tablet 0     Sig: TAKE 1 TABLET BY MOUTH ONCE DAILY       Order pended. Please advise. Thanks    Last OV: 04/16/2022   Next OV: Visit date not found

## 2022-10-23 NOTE — Unmapped (Signed)
PRE-PROCEDURE HISTORY AND PHYSICAL EXAM    David Ellis presents for his scheduled UGI W/ TRANSENDOSCOPIC ULTRASOUND GUIDED INTRAMURAL/TRANSMURAL FINE NEEDLE ASPIRATION/BIOPSY(S), ESOPHAGUS.    The indication for the procedure(s) is Pancreatic mass [K86.89].    There have been no significant recent changes in the patient's medical status.    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    PR AMPUTATION METATARSAL+TOE,SINGLE Left 07/06/2014    Procedure: AMPUTATION, METATARSAL, WITH TOE SINGLE;  Surgeon: Marion Downer, MD;  Location: MAIN OR [facility];  Service: Vascular    PR AMPUTATION METATARSAL+TOE,SINGLE Left 10/28/2014    Procedure: AMPUTATION, METATARSAL, WITH TOE SINGLE;  Surgeon: Maple Mirza, MD;  Location: MAIN OR [place];  Service: Vascular    PR COLONOSCOPY FLX DX W/COLLJ SPEC WHEN PFRMD N/A 02/27/2022    Procedure: COLONOSCOPY, FLEXIBLE, PROXIMAL TO SPLENIC FLEXURE; DIAGNOSTIC, W/WO COLLECTION SPECIMEN BY BRUSH OR WASH;  Surgeon: Luanne Bras, MD;  Location: [facility];  Service: Gastroenterology    PR COLONOSCOPY W/BIOPSY SINGLE/MULTIPLE  02/27/2022    Procedure: COLONOSCOPY, FLEXIBLE, PROXIMAL TO SPLENIC FLEXURE; WITH BIOPSY, SINGLE OR MULTIPLE;  Surgeon: Luanne Bras, MD; Location: [facility] PROCEDURES [facility];  Service: Gastroenterology    PR COLSC FLX W/RMVL OF TUMOR POLYP LESION SNARE TQ Left 02/27/2022    Procedure: COLONOSCOPY FLEX; W/REMOV TUMOR/LES BY SNARE;  Surgeon: Luanne Bras, MD;  Location: [facility] PROCED[facility];  Service: Gastroenterology    PR UPGI ENDOSCOPY W/US FN BX N/A 10/17/2022    Procedure: UGI W/ TRANSENDOSCOPIC ULTRASOUND GUIDED INTRAMURAL/TRANSMURAL FINE NEEDLE ASPIRATION/BIOPSY(S), ESOPHAGUS;  Surgeon: Kela Millin, MD;  [facility];  Service: Gastroenterology    PR VITRECTOMY,PANRETINAL LASER RX Right 11/26/2016    Procedure: VITRECTOMY, MECHANICAL, PARS PLANA APPROACH; WITH ENDOLASER PANRETINAL PHOTOCOAGULATION;  Surgeon: Melvyn Novas, MD;  Location: [facility] OR [facility];  Service: Ophthalmology    PR Hessie Knows LASER RX Left 01/28/2017    Procedure: VITRECTOMY, MECHANICAL, PARS PLANA APPROACH; WITH ENDOLASER PANRETINAL PHOTOCOAGULATION;  Surgeon: Melvyn Novas, MD;  Location: [facility] OR [facility];  Service: Ophthalmology    SKIN BIOPSY      VASECTOMY         Allergies  Allergies   Allergen Reactions    Penicillin G Other (See Comments)    Enalapril Cough    Epinephrine Palpitations    Grass Pollen-[place], Standard Itching    Levofloxacin Other (See Comments)     Per patient  Lisinopril Rash    Mepivacaine Hcl Palpitations    Penicillins Other (See Comments)     As child  Tolerated amp/sulbactam without side effects during 10/2014 hospitalization       Medications  acetaminophen, aspirin, blood-glucose meter, (cholecalciferol (vitamin D3-50 mcg (2,000 unit))), empagliflozin, enoxaparin, furosemide, gabapentin, hydrocortisone, insulin lispro, ketoconazole, losartan, magnesium chloride, metoPROLOL succinate, multivitamin, mycophenolate, (pen needle, diabetic), polyethylene glycol, pravastatin, prochlorperazine, sodium zirconium cyclosilicate, tacrolimus, and warfarin    Physical Examination  Vitals:    10/17/22 1130   BP: 160/69   Pulse: 72   Resp: 15   Temp:    SpO2: 95%     Body mass index is 31.14 kg/m??.    General:  Well-appearing, conversant, NAD     Mental Status: AAOx3, thoughts organized     Lungs: Bilateral chest expansion, no wheeze, unlabored breathing     Heart: Regular rate and rhythm      Abdomen: Soft, non-tender, non-distended     Neuro:  Grossly intact             ASSESSMENT AND PLAN  David Ellis has been evaluated and deemed appropriate to undergo the planned UGI W/ TRANSENDOSCOPIC ULTRASOUND GUIDED INTRAMURAL/TRANSMURAL FINE NEEDLE ASPIRATION/BIOPSY(S), ESOPHAGUS.    The patient, or his authorized representative, was provided a printed handout that explained the nature and benefits of the procedure(s), the most frequent risks, and alternatives, if any.  I personally reviewed this information with the patient, or his authorized representative, and answered all questions.    Edyth Gunnels, MD   [facility]

## 2022-10-23 NOTE — Unmapped (Signed)
Outpatient Clinic note           {    Coding tips - Do not edit this text, it will delete upon signing of note!    Telephone visits [phone] for Physicians and APPs and [phone] for Non- Physician Clinicians)- Only use minutes on the phone to determine level of service.    Video visits [phone] - Use either level of medical decision making just as an in-person visit OR time which includes both minutes on video and pre/post minutes to determine the level of service.      :75688}  The patient reports they are physically located in [place] and is currently: {patient location:[address]90}. I conducted a {phone audio video visit:101857} .        *** Cancer Reassessment:    Referring Physician: Referring, None Per Patient  [address]  [place],  [place] [address].       PCP: Sharlee Blew, MD    1. Abnormal CT scan  IPMN disclosed. So , not panc cancer. The nodes are worrisome.  . I spoke to radiology and contrasted CT  or MRI of the abdomen is the next step as we could find something that is more biopsiable.  The plan is to get a MRI abdomen. If this shows dz in the liver that we can bx, that will be the next step. If it does not, then, we will need a contrasted Chest CT. If the contrasted chest CT shows nodularity in the lung near the pleura, then, we can check the fluid. If that does not look worrisome, then we will need to bx the nodes in the abdomen by IR.       2. Immunosuppressed status (CMS-HCC)  ***    3. Renal insufficiency  ***    4. Multiple lung nodules  ***    5. IPMN  Consistent w EUS and Bx. Cont to monitor.     Hematology/Oncology History    No history exists.          Interim History:***      ECOG PS: ***      Problem List:   Patient Active Problem List   Diagnosis    Essential (primary) hypertension    History of kidney transplant    Mixed hyperlipidemia    Transplanted kidney    Other pulmonary embolism without acute cor pulmonale (CMS-HCC)    Edema    Right ventricular dysfunction    Atrial fibrillation (CMS-HCC)    Aftercare following organ transplant    Diverticulosis of colon    Routine general medical examination at a health care facility    OSA on CPAP    Diabetic polyneuropathy associated with type 2 diabetes mellitus (CMS-HCC)    Left knee pain    BCC (basal cell carcinoma), face    Acquired absence of other toe(s), unspecified side (CMS-HCC)    History of colonic polyps    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)    Hypoglycemia associated with type 2 diabetes mellitus (CMS-HCC)    PAD (peripheral artery disease) (CMS-HCC)    Class 2 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    Immunosuppressed status (CMS-HCC)    Syncope    Non-healing open wound of toe    At high risk for falls    Encounter for long-term (current) use of insulin (CMS-HCC)    Arthralgia of right elbow    Pain in joint of right shoulder  Pain of right hip joint    Supratherapeutic INR    Left upper quadrant abdominal mass    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      aspirin (ECOTRIN) 81 MG tablet Take 1 tablet (81 mg total) by mouth daily as needed (when flying).      cholecalciferol, vitamin D3-50 mcg, 2,000 unit,, 50 mcg (2,000 unit) tablet Take 1 tablet (50 mcg total) by mouth daily.      empagliflozin (JARDIANCE) 10 mg tablet Take 0.5 tablets (5 mg total) by mouth every morning.      enoxaparin (LOVENOX) 120 mg/0.8 mL Syrg Inject 0.7 mL (105 mg total) under the skin every twelve (12) hours. 10 mL 1    furosemide (LASIX) 40 MG tablet Take 1 tablet (40 mg total) by mouth daily. 40 mg daily. And 1 additional tablet for edema.      gabapentin (NEURONTIN) 300 MG capsule TAKE 1 CAPSULE BY MOUTH TWICE DAILY AS DIRECTED 180 capsule 3    HUMALOG KWIKPEN INSULIN 100 unit/mL injection pen INJECT 15-25 UNITS WITH MEALS + CORRECTION **MAX 100 UNITS A DAY** 60 mL 3    hydrocortisone 2.5 % cream  (Patient not taking: Reported on 10/17/2022)      ketoconazole (NIZORAL) 2 % cream  (Patient not taking: Reported on 10/17/2022)      LOKELMA 10 gram PwPk packet DOSE: 1 PACKET (10 grams) every other day.  Empty entire contents of ONE packet into a glass with 45 mL of water. Stir well and drink immediately; if powder remains in the glass, add water, stir and drink immediately; repeat until no powder remains. (Patient taking differently: DOSE: 1 PACKET (10 grams) every other day.  Empty entire contents of ONE packet into a glass with 45 mL of water. Stir well and drink immediately; if powder remains in the glass, add water, stir and drink immediately; repeat until no powder remains.) 11 packet 0    losartan (COZAAR) 25 MG tablet TAKE 1 TABLET BY MOUTH DAILY. (Patient not taking: Reported on 10/17/2022) 90 tablet 3    magnesium chloride (SLOW_MAG) 64 mg TbEC Take 128 mg by mouth three (3) times a day (at 6am, noon and 6pm). (2 tablets) 9am 1 pm and 9 pm      metoPROLOL succinate (TOPROL-XL) 100 MG 24 hr tablet TAKE ONE TABLET TWICE DAILY 180 tablet 1    multivitamin capsule Take 1 capsule by mouth daily.      MYFORTIC 180 mg EC tablet Take 1 tablet (180 mg total) by mouth two (2) times a day. 180 tablet 3    PEN NEEDLE 32 gauge x 5/32 (4 mm) Ndle USE 3 TIMES DAILY BEFORE MEALS 300 each 3    polyethylene glycol (MIRALAX) 17 gram packet Take 17 g by mouth daily as needed (constipation). (Patient not taking: Reported on 10/17/2022) 30 packet 0    pravastatin (PRAVACHOL) 40 MG tablet Take 1 tablet (40 mg total) by mouth daily. 90 tablet 3    prochlorperazine (COMPAZINE) 10 MG tablet Take 1 tablet (10 mg total) by mouth every six (6) hours as needed for nausea. 30 tablet 0  tacrolimus (PROGRAF) 1 MG capsule Take 3 capsules (3 mg total) by mouth in the morning AND 2 capsules (2 mg total) in the evening. 450 capsule 3    warfarin (JANTOVEN) 2.5 MG tablet TAKE 1 TABLET BY MOUTH ONCE DAILY 30 tablet 12     No current facility-administered medications for this visit.     Facility-Administered Medications Ordered in Other Visits   Medication Dose Route Frequency Provider Last Rate Last Admin    bevacizumab    PRN (once a day) Melvyn Novas, MD   2.5 mg at 01/28/17 0750       Personal and Social History:   Social History     Social History Narrative    Works for the city of [employer].    Retired from the [employer].      Married.      Two healthy children.         Family History   Problem Relation Age of Onset    Kidney disease Mother     Diabetes Mother     Hypertension Mother     Kidney disease Father     Cancer Father     Ulcers Father     Kidney disease Maternal Grandmother     Diabetes Maternal Grandmother     Glaucoma Neg Hx     Macular degeneration Neg Hx     Strabismus Neg Hx        Review of Systems: A complete review of systems was obtained including: Constitutional, Eyes, ENT, Cardiovascular, Respiratory, GI, GU, Musculoskeletal, Skin, Neurological, Psychiatric, Endocrine, Heme/Lymphatic, and Allergic/Immunologic systems. All other systems reviewed and are negative to the patient???s management except for what was mentioned in the interim history.       Physical Examination:  Vital Signs: There were no vitals taken for this visit.  General:  Healthy-appearing ***in no acute distress.        Labs:  Appointment on 10/22/2022   Component Date Value    PT 10/22/2022 15.1 (H)     INR 10/22/2022 1.37      GI procedure:  A round mass was identified in the region of the pancreatic body. The        mass was hypoechoic, heterogenous and likely mixed solid and cystic. The        mass measured 37 mm by 34 mm in maximal cross-sectional diameter. The        endosonographic borders were well-defined. The remainder of the pancreas        was examined but difficult to visualize. The pancreatic duct was not        adequately visualized in this region. Fine needle biopsy was performed.     Impression:            - There was no sign of significant pathology in the                          common bile duct.                         - One stone was visualized endosonographically in the                          gallbladder.                         -  A cystic lesion was seen in the pancreatic head.                         - A mass was identified in the pancreatic body. Fine                          needle biopsy performed. 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  Recommendation:        - Discharge patient to home (with escort).                         - Patient has a contact number available for                          emergencies. The signs and symptoms of potential                          delayed complications were discussed with the patient.                          Return to normal activities tomorrow. Written                          discharge instructions were provided to the patient.                         - Resume previous diet.                         - Continue present medications.                         - Resume Coumadin (warfarin) and Lovenox (enoxaparin)                          bridge as detailed by managing physician. Ok to                          restart lovenox tomorrow.    Impression      Bilateral innumerable subcentimeter nodules measuring up to 7 mm consistent with pulmonary metastasis.   Large right and trace left pleural effusions.   Ill-defined 4.2 cm hypodense mass in the distal pancreatic body and left paracolic loculated small fluid. Recommend correlation with prior abdomen CT.        Narrative   EXAM: CT CHEST WO CONTRAST   ACCESSION: [id number] UN      CLINICAL INDICATION: staging for malignancy  - K86.89 - Pancreatic mass      TECHNIQUE: Contiguous noncontrast axial images were reconstructed through the chest following a single breath hold helical acquisition. Images were reformatted in the axial. coronal, and sagittal planes. MIP slabs were also constructed.      COMPARISON: None      FINDINGS:  LUNGS, AIRWAYS, AND PLEURA: Bilateral innumerable subcentimeter nodules measuring up to 7 mm. Central airways are patent. Large right and trace left pleural effusions. No pneumothorax      MEDIASTINUM AND LYMPH NODES: No enlarged intrathoracic, axillary, or supraclavicular lymph nodes. No mediastinal mass or other abnormality.      HEART AND VASCULATURE: Cardiac chambers are normal in size. Moderate coronary artery calcifications. No pericardial effusion. Aorta is normal in caliber with calcifications. Main pulmonary artery is normal in size.      CHEST WALL AND BONES: Remote fracture in the left ninth posterior rib.      UPPER ABDOMEN: Ill-defined 4.2 cm hypodense mass in the distal pancreatic body. Left paracolic loculated small fluid.      OTHER: No other findings.       Narrative   EXAM: CT ABDOMEN PELVIS WO CONTRAST   ACCESSION: [id number] UN   CLINICAL INDICATION: 77 years old with L sided abd pain x 2 weeks, h/o kidney transplant no contrast        COMPARISON: None.      TECHNIQUE: A spiral CT scan was obtained without IV contrast from the lung bases to the pubic symphysis.  Images were reconstructed in the axial plane. Coronal and sagittal reformatted images were also provided for further evaluation.      Evaluation of the solid organs and vasculature is limited in the absence of intravenous contrast.         FINDINGS:      LOWER CHEST: Multiple solid nodules in the visualized lower lungs, for example middle lobe perifissural measuring 0.6 cm (2:10). Right pleural effusion.      LIVER: Normal liver contour. No focal liver lesion on non-contrast examination.      BILIARY: Cholelithiasis. BONES: Remote fracture in the left ninth posterior rib.      UPPER ABDOMEN: Ill-defined 4.2 cm hypodense mass in the distal pancreatic body. Left paracolic loculated small fluid.      OTHER: No other findings.       Narrative   EXAM: CT ABDOMEN PELVIS WO CONTRAST   ACCESSION: [id number] UN   CLINICAL INDICATION: 77 years old with L sided abd pain x 2 weeks, h/o kidney transplant no contrast        COMPARISON: None.      TECHNIQUE: A spiral CT scan was obtained without IV contrast from the lung bases to the pubic symphysis.  Images were reconstructed in the axial plane. Coronal and sagittal reformatted images were also provided for further evaluation.      Evaluation of the solid organs and vasculature is limited in the absence of intravenous contrast.         FINDINGS:      LOWER CHEST: Multiple solid nodules in the visualized lower lungs, for example middle lobe perifissural measuring 0.6 cm (2:10). Right pleural effusion.      LIVER: Normal liver contour. No focal liver lesion on non-contrast examination.      BILIARY: Cholelithiasis. No intrahepatic biliary ductal dilatation.      SPLEEN: Normal in size and contour.      PANCREAS: Mass in the area of the pancreatic tail measuring 5.3 x 3.7 x 3.7 cm (2:52, 4:70). The mass abuts the inferior border of the greater curvature of the stomach and the superior border of the proximal jejunum. Crescentic fat density is seen along the anterior portion of the mass (2:49), possibly within the mass or adjacent mesenteric fat. No gross ductal dilatation.  ADRENAL GLANDS: Left adrenal nodule measuring 2.8 cm. The right adrenal gland is normal.      KIDNEYS/URETERS: Atrophic native kidneys with subcentimeter lesions too small to characterize. Right lower quadrant renal transplant. No hydronephrosis. No nephrolithiasis. paracolic gutter,      BLADDER: Unremarkable.      REPRODUCTIVE ORGANS: Unremarkable.      GI TRACT: No findings of bowel obstruction or acute inflammation. Normal appendix (2:94).      PERITONEUM/RETROPERITONEUM AND MESENTERY: No free air. The inferior right paracolic gutter,. No fluid collection.      VASCULATURE: Atherosclerotic calcifications of the abdominal aorta and branch vessels. Normal caliber aorta. Otherwise, limited evaluation without contrast.      LYMPH NODES: Enlarged porta hepatis, periaortic, and aortocaval lymph nodes with a reference aortocaval node measuring 2.1 cm (2:66).      BONES and SOFT TISSUES: No aggressive osseous lesions. No focal soft tissue lesions.           This note is assisted with Office manager and it may have typographical or grammatical errors.

## 2022-10-24 ENCOUNTER — Telehealth: Admit: 2022-10-24 | Discharge: 2022-10-25 | Payer: MEDICARE

## 2022-10-24 DIAGNOSIS — R918 Other nonspecific abnormal finding of lung field: Principal | ICD-10-CM

## 2022-10-24 DIAGNOSIS — R110 Nausea: Principal | ICD-10-CM

## 2022-10-24 DIAGNOSIS — R9389 Abnormal findings on diagnostic imaging of other specified body structures: Principal | ICD-10-CM

## 2022-10-24 DIAGNOSIS — R0602 Shortness of breath: Principal | ICD-10-CM

## 2022-10-24 DIAGNOSIS — D849 Immunodeficiency, unspecified: Principal | ICD-10-CM

## 2022-10-24 DIAGNOSIS — K8689 Other specified diseases of pancreas: Principal | ICD-10-CM

## 2022-10-24 DIAGNOSIS — N289 Disorder of kidney and ureter, unspecified: Principal | ICD-10-CM

## 2022-10-24 DIAGNOSIS — G4733 Obstructive sleep apnea (adult) (pediatric): Principal | ICD-10-CM

## 2022-10-24 MED ORDER — PROCHLORPERAZINE MALEATE 10 MG TABLET
ORAL_TABLET | Freq: Four times a day (QID) | ORAL | 6 refills | 8 days | Status: CP | PRN
Start: 2022-10-24 — End: 2022-11-23

## 2022-10-24 NOTE — Unmapped (Signed)
Addended by: Roland Rack on: 10/24/2022 12:55 PM     Modules accepted: Orders

## 2022-10-24 NOTE — Unmapped (Signed)
Addended by: Roland Rack on: 10/24/2022 12:57 PM     Modules accepted: Orders

## 2022-10-26 ENCOUNTER — Ambulatory Visit: Admit: 2022-10-26 | Discharge: 2022-10-27 | Payer: MEDICARE

## 2022-10-26 LAB — PROTIME-INR
INR: 1.84
PROTIME: 20.2 s — ABNORMAL HIGH (ref 9.9–12.6)

## 2022-10-26 MED ORDER — TRAMADOL 50 MG TABLET
ORAL_TABLET | Freq: Four times a day (QID) | ORAL | 0 refills | 8 days | Status: CP | PRN
Start: 2022-10-26 — End: ?

## 2022-10-26 MED ADMIN — gadobenate dimeglumine (MULTIHANCE) 529 mg/mL (0.1mmol/0.2mL) solution 10 mL: 10 mL | INTRAVENOUS | @ 20:00:00 | Stop: 2022-10-26

## 2022-10-26 NOTE — Unmapped (Signed)
Sent in script for tramadol. He did not want to try an opioid. Will also have him optimize Tylenol.

## 2022-10-26 NOTE — Unmapped (Signed)
Called and spoke with pt wife a couple of times today.  She did say he has been a little more uncomfortable and felt more bloated.  Dr Rozell Searing put in a script for tramadol and I let her know she can pick that up for breakthrough pain.  She is aware since MRI is being done so late we will not be able to come up with a plan going forward until next Tuesday after the holiday.  I will contact her then to go over plan based on MRI.  Wife is on board with this plan.      She has also been in touch Dr  Dellis Filbert about his anticoagulation as well as Dr Arvin Collard his nephrologist who will help with lasix and or ivf if contrasted CT is needed and any ongoing fluid issues that may come up prior to that.      Made sure pt wife has nurse triage phone number if needed over the weekend [phone].

## 2022-10-29 MED ORDER — ENOXAPARIN 120 MG/0.8 ML SUBCUTANEOUS SYRINGE
Freq: Two times a day (BID) | SUBCUTANEOUS | 1 refills | 7 days | Status: CP
Start: 2022-10-29 — End: ?

## 2022-10-30 ENCOUNTER — Ambulatory Visit: Admit: 2022-10-30 | Discharge: 2022-10-31 | Payer: MEDICARE

## 2022-10-30 DIAGNOSIS — R9389 Abnormal findings on diagnostic imaging of other specified body structures: Principal | ICD-10-CM

## 2022-10-30 DIAGNOSIS — K8689 Other specified diseases of pancreas: Principal | ICD-10-CM

## 2022-10-30 DIAGNOSIS — D379 Neoplasm of uncertain behavior of digestive organ, unspecified: Principal | ICD-10-CM

## 2022-10-30 DIAGNOSIS — D372 Neoplasm of uncertain behavior of small intestine: Principal | ICD-10-CM

## 2022-10-30 LAB — PROTIME-INR
INR: 3.81
PROTIME: 40.7 s — ABNORMAL HIGH (ref 9.9–12.6)

## 2022-10-30 NOTE — Unmapped (Signed)
Hi,     David Ellis contacted the [employer] requesting to speak with the care team of David Ellis to discuss:    Missed a call from Nurse Bunch, wanted follow up as soon as possible.    Please contact [name]  at [phone].    Thank you,   Kelli Hope  [facility]   [phone]

## 2022-10-30 NOTE — Unmapped (Signed)
Call handled by [facility] nurse, Laneta Simmers.  Please see his telephone note.

## 2022-10-30 NOTE — Unmapped (Signed)
Called and spoke with pt and his wife to let him know we are putting in a new order for EUS to biopsy one of the lymph nodes seen on MRI.  We also put in GI surg onc and med onc referrals.  She is going to call and see if pall care can do a tele visit for his upcoming appt.  Per wife tramadol is helping with his pain.

## 2022-10-31 MED ORDER — APIXABAN 5 MG TABLET
ORAL_TABLET | Freq: Two times a day (BID) | ORAL | 6 refills | 30 days | Status: CP
Start: 2022-10-31 — End: 2022-10-31

## 2022-10-31 NOTE — Unmapped (Signed)
Spoke to patient and patient's wife [name]. Patient has an endoscopy scheduled for Tuesday, June 4th and may need another lovenox bridge. They were told by their pharmacy that he has exceeded the 57 day limit for lovenox and per pharmacy will need a prior authorization or a call placed by provider to patient's insurance to get more lovenox approved.

## 2022-10-31 NOTE — Unmapped (Signed)
Thank you all very very much.    I just spoke with him and his wife and     1) confirmed the challenging situation that we have and uncertain of diagnosis and overall concerning perioperative risk (without a specific medical risk); he voices understanding and agreemenbt with this assessment.    2) notified them that we're ALL speaking and are all trying to be intentional and thoughtful in discussions,   3) we are stopping coumadin today (this is five days prior to the procedure next week).  Will let thecoumadin completely leave the body and will not bridge with lovenox as Gillman's procedure requested no lovenox X 3 days.    4) after the procedure, he will start ELIQUIS 5 mg bid.  Script called in and he will pick up and let meknow if cost is terribly high.  Do not plan to go back to coumadin for the near future.    5) While I commented that it would be good to have a positive diagnosis of cancer to justify the risk of the pancreas surgery, it sounds to me that even if negative, the recommendation would/might be to go to surgery anyways.  I believe that he and his wife should make this decision prior to biposy next Tuesday.    He has apointment with Dr. Dan Europe tomorrow by Ermalinda Barrios and I'm wondering, Drenda Freeze, if you can speak with him re: this.      All of my comments will be documented in a telephone note and will be sent to him with the script for eliquis which he'll start after the biopsy.    [name], M.D.     [facility] at [facility]   [address]  [address]  [place], [place]  [address]  [phone]

## 2022-10-31 NOTE — Unmapped (Signed)
Upper EUS  Procedure #1     Procedure #2   [id number]  MRN   David Ellis  Endoscopist     Is the patient's health insurance [employer], [facility]), or [employer]?     Urgent procedure     Are you pregnant?     Are you in the process of scheduling or awaiting results of a heart ultrasound, stress test, or catheterization to evaluate new or worsening chest pain, dizziness, or shortness of breath?   TRUE  Do you take: Plavix (clopidogrel), Coumadin (warfarin), Lovenox (enoxaparin), Pradaxa (dabigatran), Effient (prasugrel), Xarelto (rivaroxaban), Eliquis (apixaban), Pletal (cilostazol), or Brilinta (ticagrelor)?   Lovenox       Which of the above medications are you taking?   and coumadin       What is the name of the medical practice that manages this medication?   on file in chart       What is the name of the medical provider who manages this medication?     Do you have hemophilia, von Willebrand disease, or low platelets?     Do you have a pacemaker or implanted cardiac defibrillator?     Has a [place] [facility] provider specified the location(s)?     Which location(s) did the [facility] provider specify?        [facility]        [place]        [facility]        [facility]     Is procedure indication for variceal banding (this does NOT include variceal screening)?     Have you had a heart attack, stroke or heart stent placement within the past 6 months?     Month of event     Year of event (ONLY ENTER LAST 2 DIGITS)        6  Height (feet)   1  Height (inches)   235  Weight (pounds)   31.0  BMI          Did the ordering provider specify a bowel prep?          What bowel prep was specified?     Do you have chronic kidney disease?     Do you have chronic constipation or have you had poor quality bowel preps for past colonoscopies?     Do you have Crohn's disease or ulcerative colitis?     Have you had weight loss surgery?          When you walk around your house or grocery store, do you have to stop and rest due to shortness of breath, chest pain, or light-headedness?     Do you ever use supplemental oxygen?     Have you been hospitalized for cirrhosis of the liver or heart failure in the last 12 months?     Have you been treated for mouth or throat cancer with radiation or surgery?     Have you been told that it is difficult for doctors to insert a breathing tube in you during anesthesia?     Have you had a heart or lung transplant?          Are you on dialysis?     Do you have cirrhosis of the liver?     Do you have myasthenia gravis?     Is the patient a prisoner?          Have you been diagnosed with sleep apnea  or do you wear a CPAP machine at night?     Are you younger than 30?     Have you previously received propofol sedation administered by an anesthesiologist for a GI procedure?     Do you drink an average of more than 3 drinks of alcohol per day?     Do you regularly take suboxone or any prescription medications for chronic pain?     Do you regularly take Ativan, Klonopin, Xanax, Valium, lorazepam, clonazepam, alprazolam, or diazepam?     Have you previously had difficulty with sedation during a GI procedure?     Have you been diagnosed with PTSD?     Are you allergic to fentanyl or midazolam (Versed)?     Do you take medications for HIV?   ################# ## ###################################################################################################################   MRN:          [id number]   Anticoag Review:  Yes   Nurse Triage:  No   GI Clinic Consult:  No   Procedure(s):  Upper EUS     0   [facility]     [place]     [facility]   Endoscopist:  Gilman   Urgent:            No   Prep:                                 ################# ## ###################################################################################################################

## 2022-10-31 NOTE — Unmapped (Signed)
Multidisciplinary Oncology Program Intake Form    Referral Receive Date: 10/30/22    Rapid Access Appointment was offered and declined  N/A    Reason for Referral: pancreas mass  Initial Consultation: No treatment started for diagnosis  Disease Group: Gastrointestinal   Specialty:  Medical Oncology and Surgery    Referral Method:  Epic    Insurance  Primary Insurance: Medicare    Authorization Obtained: NA      Record Collection:     RECORDS -  INTERNAL REFERRAL      COVID Screening  Travel Screening Completed: Yes  Has patient tested positive for COVID in the past 21 days?: No    Close the Loop Communication:    Referring Provider Contacted: Yes  Patient Contacted: Yes    Welcome Packet   Date Sent: N/A  Sent: NNPP

## 2022-10-31 NOTE — Unmapped (Signed)
Outpatient Clinic note             The patient reports they are physically located in [place] and is currently: at home. I conducted a audio/video visit. I spent 20 minutes on the video call with the patient. I spent an additional 20 minutes on pre- and post-visit activities on the date of service .        Referring Physician: Felisa Zechman, Jason Fila, Md  [address]  [address] [place],  [place] [address]. [phone]      PCP: Sharlee Blew, MD    1. 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 A UGI endoscopy has been set up for 6/6. Today we discussed the possibilities. If the nodes are + for malignancy, it is unlikely that the pt would be a candidate for surgery. He may be a candidate for chemo. He would have to discuss his GOC with the treating oncologist. I went over broadly what palliative chemo could entail. I explained that his doctors (me, Dr Dellis Filbert and Dr Zenaida Deed) are worried that his health would preclude a surg and moreover if cancer is found in a node, it would not make biologic since unless Dr Selena Batten felt a surgery was palliative.    I will set my calendar to check for the path on June 11. If a cancer is disclosed that is not GI origin, I will refer him to the correct clinic. If the bx is non diagnostic, I would prefer not to think about that because I would rather not put him through a third bx.     Dr Dellis Filbert is coordinating the anticagulation again.           2. Immunosuppressed status (CMS-HCC)  Pt is at high risk for malignancy, increasing the pretest prob of these scans  3. Symtoms  Abdominal pain    Cont tramadol. Pt taking it approx bid.   Cont tylenol in between.     Constipation  The pt gets pain when he has BM. He will cont to use stool softeners.     4. Multiple lung nodules  Not discussed today. Interim History:The pt and his wife are on the video w me. THe pt is doing ok. Abd pain in the left upper quad radiating toward the bk. Pain w BMs. Knot in the left chest wall when he had a lovenox shot.     ECOG PS: 1      Problem List:   Patient Active Problem List   Diagnosis    Essential (primary) hypertension    History of kidney transplant    Mixed hyperlipidemia    Transplanted kidney    Other pulmonary embolism without acute cor pulmonale (CMS-HCC)    Edema    Right ventricular dysfunction    Atrial fibrillation (CMS-HCC)    Aftercare following organ transplant    Diverticulosis of colon    Routine general medical examination at a health care facility    OSA on CPAP    Diabetic polyneuropathy associated with type 2 diabetes mellitus (CMS-HCC)    Left knee pain    BCC (basal cell carcinoma), face    Acquired absence of other toe(s), unspecified side (CMS-HCC)    History of colonic polyps    Type 2 diabetes  mellitus with other diabetic kidney complication (CMS-HCC)    Hypoglycemia associated with type 2 diabetes mellitus (CMS-HCC)    PAD (peripheral artery disease) (CMS-HCC)    Class 2 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    Immunosuppressed status (CMS-HCC)    Syncope    Non-healing open wound of toe    At high risk for falls    Encounter for long-term (current) use of insulin (CMS-HCC)    Arthralgia of right elbow    Pain in joint of right shoulder    Pain of right hip joint    Supratherapeutic INR    Left upper quadrant abdominal mass    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      aspirin (ECOTRIN) 81 MG tablet Take 1 tablet (81 mg total) by mouth daily as needed (when flying). cholecalciferol, vitamin D3-50 mcg, 2,000 unit,, 50 mcg (2,000 unit) tablet Take 1 tablet (50 mcg total) by mouth daily.      empagliflozin (JARDIANCE) 10 mg tablet Take 0.5 tablets (5 mg total) by mouth every morning.      enoxaparin (LOVENOX) 120 mg/0.8 mL Syrg Inject 0.7 mL (105 mg total) under the skin every twelve (12) hours. 10 mL 1    furosemide (LASIX) 40 MG tablet Take 1 tablet (40 mg total) by mouth daily. 40 mg daily. And 1 additional tablet for edema.      gabapentin (NEURONTIN) 300 MG capsule TAKE 1 CAPSULE BY MOUTH TWICE DAILY AS DIRECTED 180 capsule 3    HUMALOG KWIKPEN INSULIN 100 unit/mL injection pen INJECT 15-25 UNITS WITH MEALS + CORRECTION **MAX 100 UNITS A DAY** 60 mL 3    hydrocortisone 2.5 % cream  (Patient not taking: Reported on 10/17/2022)      ketoconazole (NIZORAL) 2 % cream  (Patient not taking: Reported on 10/17/2022)      LOKELMA 10 gram PwPk packet DOSE: 1 PACKET (10 grams) every other day.  Empty entire contents of ONE packet into a glass with 45 mL of water. Stir well and drink immediately; if powder remains in the glass, add water, stir and drink immediately; repeat until no powder remains. (Patient taking differently: DOSE: 1 PACKET (10 grams) every other day.  Empty entire contents of ONE packet into a glass with 45 mL of water. Stir well and drink immediately; if powder remains in the glass, add water, stir and drink immediately; repeat until no powder remains.) 11 packet 0    losartan (COZAAR) 25 MG tablet TAKE 1 TABLET BY MOUTH DAILY. (Patient not taking: Reported on 10/17/2022) 90 tablet 3    magnesium chloride (SLOW_MAG) 64 mg TbEC Take 128 mg by mouth three (3) times a day (at 6am, noon and 6pm). (2 tablets) 9am 1 pm and 9 pm      metoPROLOL succinate (TOPROL-XL) 100 MG 24 hr tablet TAKE ONE TABLET TWICE DAILY 180 tablet 1    multivitamin capsule Take 1 capsule by mouth daily.      MYFORTIC 180 mg EC tablet Take 1 tablet (180 mg total) by mouth two (2) times a day. 180 tablet 3    PEN NEEDLE 32 gauge x 5/32 (4 mm)  Ndle USE 3 TIMES DAILY BEFORE MEALS 300 each 3    polyethylene glycol (MIRALAX) 17 gram packet Take 17 g by mouth daily as needed (constipation). (Patient not taking: Reported on 10/17/2022) 30 packet 0    pravastatin (PRAVACHOL) 40 MG tablet Take 1 tablet (40 mg total) by mouth daily. 90 tablet 3    prochlorperazine (COMPAZINE) 10 MG tablet Take 1 tablet (10 mg total) by mouth every six (6) hours as needed for nausea. 30 tablet 6    tacrolimus (PROGRAF) 1 MG capsule Take 3 capsules (3 mg total) by mouth in the morning AND 2 capsules (2 mg total) in the evening. 450 capsule 3    traMADol (ULTRAM) 50 mg tablet Take 1 tablet (50 mg total) by mouth every six (6) hours as needed for pain. 30 tablet 0     No current facility-administered medications for this visit.     Facility-Administered Medications Ordered in Other Visits   Medication Dose Route Frequency Provider Last Rate Last Admin    bevacizumab    PRN (once a day) Melvyn Novas, MD   2.5 mg at 01/28/17 0750       Personal and Social History:   Social History     Social History Narrative    Works for the city of [employer].    Retired from the [employer].      Married.      Two healthy children.         Family History   Problem Relation Age of Onset    Kidney disease Mother     Diabetes Mother     Hypertension Mother     Kidney disease Father     Cancer Father     Ulcers Father     Kidney disease Maternal Grandmother     Diabetes Maternal Grandmother     Glaucoma Neg Hx     Macular degeneration Neg Hx     Strabismus Neg Hx        Review of Systems: A complete review of systems was obtained including: Constitutional, Eyes, ENT, Cardiovascular, Respiratory, GI, GU, Musculoskeletal, Skin, Neurological, Psychiatric, Endocrine, Heme/Lymphatic, and Allergic/Immunologic systems. All other systems reviewed and are negative to the patient???s management except for what was mentioned in the interim history.       Physical Examination:  Vital Signs: There were no vitals taken for this visit.  General:  Tired-appearing.         Labs:  Appointment on 10/30/2022   Component Date Value    PT 10/30/2022 40.7 (H)     INR 10/30/2022 3.81        This note is assisted with Office manager and it may have typographical or grammatical errors.

## 2022-10-31 NOTE — Unmapped (Signed)
I just spoke with family.  We are stopping coumadin and will not restart lovenox.    Will change to eliquis.  I will document this and I have communicated with them . THan kyou.    [name], M.D.     [facility] Internal Medicine at [facility]   [address]  [address]  [place], [place]  [address]  [phone]

## 2022-11-01 ENCOUNTER — Telehealth: Admit: 2022-11-01 | Discharge: 2022-11-02 | Payer: MEDICARE

## 2022-11-01 DIAGNOSIS — R9389 Abnormal findings on diagnostic imaging of other specified body structures: Principal | ICD-10-CM

## 2022-11-01 DIAGNOSIS — D490 Neoplasm of unspecified behavior of digestive system: Principal | ICD-10-CM

## 2022-11-01 DIAGNOSIS — R109 Unspecified abdominal pain: Principal | ICD-10-CM

## 2022-11-01 DIAGNOSIS — Z7901 Long term (current) use of anticoagulants: Principal | ICD-10-CM

## 2022-11-01 DIAGNOSIS — D849 Immunodeficiency, unspecified: Principal | ICD-10-CM

## 2022-11-01 DIAGNOSIS — K5900 Constipation, unspecified: Principal | ICD-10-CM

## 2022-11-01 MED ORDER — APIXABAN 5 MG TABLET
ORAL_TABLET | Freq: Two times a day (BID) | ORAL | 3 refills | 90 days | Status: CP
Start: 2022-11-01 — End: ?

## 2022-11-01 MED ORDER — TRAMADOL 50 MG TABLET
ORAL_TABLET | Freq: Four times a day (QID) | ORAL | 0 refills | 10 days | Status: CP | PRN
Start: 2022-11-01 — End: ?

## 2022-11-01 NOTE — Unmapped (Signed)
Patient also has telephone encounter regarding this, provider responded via telephone encounter.

## 2022-11-01 NOTE — Unmapped (Signed)
COnt to take tramadol twice a day but if you hurt in between go ahead and add a third dose. Also, cont the tylenol. COnt to keep the stools on the soft side.  Use warm compress on the knot that is on the side of the body. Follow Dr Dorthula Rue instructions prior to the next biopsy.

## 2022-11-01 NOTE — Unmapped (Signed)
Patient's spouse [name] called, stating that they had spoken to Dr. Dellis Filbert yesterday and patient was to pick up prescription for Eliquis today. Pharmacy has not received this prescription yet, they are requesting that the Eliquis be sent to [facility] in [place].

## 2022-11-01 NOTE — Unmapped (Signed)
Done.    [name], M.D.     [facility] Internal Medicine at [facility]   [address]  [address]  [place], [place]  [address]  [phone]

## 2022-11-01 NOTE — Unmapped (Signed)
Spoke to patient and patient's spouse to notify them prescription has been sent to requested pharmacy, they verbalized understanding and were appreciative of call.

## 2022-11-02 ENCOUNTER — Ambulatory Visit: Admit: 2022-11-02 | Discharge: 2022-11-03 | Payer: MEDICARE

## 2022-11-02 LAB — RENAL FUNCTION PANEL
ALBUMIN: 2.9 g/dL — ABNORMAL LOW (ref 3.4–5.0)
ANION GAP: 7 mmol/L (ref 5–14)
BLOOD UREA NITROGEN: 33 mg/dL — ABNORMAL HIGH (ref 9–23)
BUN / CREAT RATIO: 27
CALCIUM: 9 mg/dL (ref 8.7–10.4)
CHLORIDE: 105 mmol/L (ref 98–107)
CO2: 28.5 mmol/L (ref 20.0–31.0)
CREATININE: 1.24 mg/dL — ABNORMAL HIGH
EGFR CKD-EPI (2021) MALE: 61 mL/min/{1.73_m2} (ref >=60)
GLUCOSE RANDOM: 175 mg/dL — ABNORMAL HIGH (ref 70–99)
PHOSPHORUS: 3.9 mg/dL (ref 2.4–5.1)
POTASSIUM: 4.7 mmol/L (ref 3.4–4.8)
SODIUM: 140 mmol/L (ref 135–145)

## 2022-11-02 LAB — CBC W/ AUTO DIFF
BASOPHILS ABSOLUTE COUNT: 0.1 10*9/L (ref 0.0–0.1)
BASOPHILS RELATIVE PERCENT: 0.6 %
EOSINOPHILS ABSOLUTE COUNT: 0.1 10*9/L (ref 0.0–0.5)
EOSINOPHILS RELATIVE PERCENT: 1 %
HEMATOCRIT: 43.2 % (ref 39.0–48.0)
HEMOGLOBIN: 14.3 g/dL (ref 12.9–16.5)
LYMPHOCYTES ABSOLUTE COUNT: 0.6 10*9/L — ABNORMAL LOW (ref 1.1–3.6)
LYMPHOCYTES RELATIVE PERCENT: 5.9 %
MEAN CORPUSCULAR HEMOGLOBIN CONC: 33.1 g/dL (ref 32.0–36.0)
MEAN CORPUSCULAR HEMOGLOBIN: 28.2 pg (ref 25.9–32.4)
MEAN CORPUSCULAR VOLUME: 85.3 fL (ref 77.6–95.7)
MEAN PLATELET VOLUME: 8.3 fL (ref 6.8–10.7)
MONOCYTES ABSOLUTE COUNT: 0.8 10*9/L (ref 0.3–0.8)
MONOCYTES RELATIVE PERCENT: 7.7 %
NEUTROPHILS ABSOLUTE COUNT: 9 10*9/L — ABNORMAL HIGH (ref 1.8–7.8)
NEUTROPHILS RELATIVE PERCENT: 84.8 %
NUCLEATED RED BLOOD CELLS: 0 /100{WBCs} (ref <=4)
PLATELET COUNT: 286 10*9/L (ref 150–450)
RED BLOOD CELL COUNT: 5.07 10*12/L (ref 4.26–5.60)
RED CELL DISTRIBUTION WIDTH: 14.9 % (ref 12.2–15.2)
WBC ADJUSTED: 10.6 10*9/L (ref 3.6–11.2)

## 2022-11-02 LAB — PROTIME-INR
INR: 1.97
PROTIME: 21.4 s — ABNORMAL HIGH (ref 9.9–12.6)

## 2022-11-02 LAB — MAGNESIUM: MAGNESIUM: 1.9 mg/dL (ref 1.6–2.6)

## 2022-11-02 LAB — TACROLIMUS LEVEL, TROUGH: TACROLIMUS, TROUGH: 4 ng/mL — ABNORMAL LOW (ref 5.0–15.0)

## 2022-11-05 ENCOUNTER — Ambulatory Visit: Payer: Medicare Other | Admitting: Dermatology

## 2022-11-05 ENCOUNTER — Ambulatory Visit: Admit: 2022-11-05 | Discharge: 2022-11-06 | Payer: MEDICARE

## 2022-11-06 ENCOUNTER — Ambulatory Visit: Admit: 2022-11-06 | Discharge: 2022-11-06 | Payer: MEDICARE

## 2022-11-06 ENCOUNTER — Encounter
Admit: 2022-11-06 | Discharge: 2022-11-06 | Payer: MEDICARE | Attending: Nurse Practitioner | Primary: Nurse Practitioner

## 2022-11-06 DIAGNOSIS — I151 Hypertension secondary to other renal disorders: Principal | ICD-10-CM

## 2022-11-06 DIAGNOSIS — Z79899 Other long term (current) drug therapy: Principal | ICD-10-CM

## 2022-11-06 DIAGNOSIS — Z940 Kidney transplant status: Principal | ICD-10-CM

## 2022-11-06 MED ADMIN — phenylephrine 0.8 mg/10 mL (80 mcg/mL) injection: INTRAVENOUS | @ 13:00:00 | Stop: 2022-11-06

## 2022-11-06 MED ADMIN — lidocaine (PF) (XYLOCAINE-MPF) 20 mg/mL (2 %) injection: INTRAVENOUS | @ 12:00:00 | Stop: 2022-11-06

## 2022-11-06 MED ADMIN — Propofol (DIPRIVAN) injection: INTRAVENOUS | @ 12:00:00 | Stop: 2022-11-06

## 2022-11-06 MED ADMIN — lactated Ringers infusion: 10 mL/h | INTRAVENOUS | @ 12:00:00 | Stop: 2022-11-06

## 2022-11-06 MED ADMIN — lactated Ringers infusion: 10 mL/h | INTRAVENOUS | @ 13:00:00 | Stop: 2022-11-06

## 2022-11-06 MED ADMIN — propofol (DIPRIVAN) infusion 10 mg/mL: INTRAVENOUS | @ 12:00:00 | Stop: 2022-11-06

## 2022-11-06 NOTE — Unmapped (Signed)
PCP:  Sharlee Blew, MD      ASSESSMENT/PLAN: David Ellis is a 77 y.o. patient s/p LURD kidney transplant 2013 complicated by a massive PE in 2014, osteomyelitis requiring lateral foot amputation in 2016, CVA in 2017, deteriorating vision and mobility, with recent profound weight loss and finding of abdominal mass and lymphadenopathy concerning for malignancy.    ## Abdominal Mass: Most likely malignant and meeting with GI oncology/surgery to discuss steps for further diagnosis and treatment. Surgery may not be an option given his complex cardiovascular/medical problems. Biopsy from today pending.    ## Anorexia: Discussed the importance of hydration -- minimum of 30-40oz fluid daily. Encouraged him to take in at least 1200-1500kcal/day even if it means just using dietary supplements.     ## S/P LURD kidney transplant 2013 with prior baseline creatinine ~1.6-2.0, but running lower more recently, possibly due to loss of muscle mass. Will order cystatin C with next lab draw.  Has a history of a DQ 7 donor specific antibody noted on 09/17/2011 with all subsequent HLA antibody screens revealing no DSA's. Now on half dose mycophenolate given his likely malignancy.     ## High risk medication monitoring: Most recent tacrolimus trough on the low side at 4.0, however, I communicated with Dr. Margaretmary Bayley who prefers to keep a lower goal of 2-4 for his troughs in the setting of CA. If requires chemotherapy, will discontinue mycophenolate.    ## Skin lesions: Neither the eschar of his great toe or the abdominal fluid blister appear infected. They will continue to monitor for erythema/warmth/pain.    ## Hyperkalemia: Potassium 4.7 today. Continue lokelma.     ## Stage A2G3a CKD:  Has persistent albuminuria so continue losartan 25 mg daily and Jardiance 10mg  daily. Below his baseline creatinine on 04/16/22.     ## HTN: Well controlled. Continue metoprolol, losartan and lasix.    ## T2DM: HbA1c 7.1% in April followed by Dr. Tiburcio Pea in endocrinology - currently on Jardiance and Humalog. History of proliferative retinopathy s/p PPV/endolaser treatments, last evaluated by ophthalmology in 08/2022.     ## ASHD/CVA history. Continue pravastatin and ASA.     ## A-Fib. Switching to apixiban to ease the burden of INR checks and warfarin bridging for procedures.    ## HM: Had his covid vaccinations and is UTD on flu, pneumonia and shingrix. Colonoscopy 02/2022. Continue biannual follow-up with dermatology re: recurrent basal cell CA. Given high risk for severe COVID19 infection, he received Evusheld injections on 06/28/20, 08/26/20, 02/28/21.    ## High falls risk: Uses a walker and cane at home and wheelchair when out and about. David Ellis struggles at times to get him up when he struggles but is doing okay for now. Discussed reaching out to Dr. Dellis Filbert if/when they require [facility] assistance.     DavidJarom A Abe will follow up with me in 3 mos.    Raden Byington K. Dontavious Emily, MD  Nephrology and Hypertension  [email or web address]  [phone]  __________________________________________________________________________    HPI: David Ellis is a 77 y.o. man s/p LURD kidney transplant (from his wife, David Ellis) 08/21/2011 and with a B/L creatinine ~1.4-1.7mg /dl. 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     He has had multiple complications since his transplant  including a large, provoked (long car ride) pulmonary embolus requiring thrombolysis (Dec 2014), osteomyelitis of his left foot requiring 4th/5th toe amputation (2016), recurrent basal cell CA, CVA s/p thombolysis (07/2015). He has significant PAD and neuropathy and has no sensation on either leg below his knees. He also has severe retinopathy and his vision is quite poor so no longer drives.     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 Today, he underwent a second biopsy as it is most likely the mass showing IPMN has undergone partial malignant transformation. He and his wife, David Ellis, are frustrated at the lack of diagnosis but understand the issues. David Ellis is particularly concerned about his lack of eating and drinking, though he did receive 1.5L saline today during the biopsy. She is also concerned about a persistent abdominal blister on his abdomen where she had given him a lovenox shot several weeks ago and an eschar over his left great toe. David Ellis says he is fatigued and just has no appetite. He is having trouble getting out of bed and standing from the commode. David Ellis is caring for him 24x7, but will be taking a trip with her two youngest grand kids that has been scheduled for the last year and their daughter will stay with David Ellis. No F/C, CP/cough, N/V/D. Urinating well and starting to lose continence.    PAST MEDICAL HISTORY:  Past Medical History:   Diagnosis Date    Arthritis     Atrial flutter (CMS-HCC)     Cataract     Diabetic nephropathy (CMS-HCC)     Diabetic retinopathy (CMS-HCC)     Fractures     Ganglion cyst     Hand injury     Heart disease     Hyperlipidemia     Hypertension     Impaired mobility     Joint pain     Lack of access to transportation     Osteomyelitis (CMS-HCC) 11/2014    Pancreatic cancer (CMS-HCC) 10/02/2022    Pulmonary embolism (CMS-HCC) 05/2014    Retinopathy due to secondary diabetes (CMS-HCC) 01/2013    Squamous cell skin cancer 11/2013    Stroke (CMS-HCC)     Tear of meniscus of knee     Transplanted kidney 08/21/2011       ALLERGIES  Enalapril; Epinephrine; Grass pollen-bermuda, standard; Levofloxacin; Lisinopril; Mepivacaine hcl; and Penicillins    MEDICATIONS:  Current Outpatient Medications   Medication Sig Dispense Refill    acetaminophen (TYLENOL) 325 MG tablet Take by mouth.      apixaban (ELIQUIS) 5 mg Tab Take 1 tablet (5 mg total) by mouth two (2) times a day. 180 tablet 3    aspirin (ECOTRIN) 81 MG tablet Take 1 tablet (81 mg total) by mouth daily as needed (when flying).      cholecalciferol, vitamin D3-50 mcg, 2,000 unit,, 50 mcg (2,000 unit) tablet Take 1 tablet (50 mcg total) by mouth daily.      empagliflozin (JARDIANCE) 10 mg tablet Take 0.5 tablets (5 mg total) by mouth every morning.      furosemide (LASIX) 40 MG tablet Take 1 tablet (40 mg total) by mouth daily. 40 mg daily. And 1 additional tablet for edema. (Patient not taking: Reported on 11/06/2022)      gabapentin (NEURONTIN) 300  MG capsule TAKE 1 CAPSULE BY MOUTH TWICE DAILY AS DIRECTED 180 capsule 3    HUMALOG KWIKPEN INSULIN 100 unit/mL injection pen INJECT 15-25 UNITS WITH MEALS + CORRECTION **MAX 100 UNITS A DAY** 60 mL 3    hydrocortisone 2.5 % cream  (Patient not taking: Reported on 10/17/2022)      ketoconazole (NIZORAL) 2 % cream  (Patient not taking: Reported on 10/17/2022)      LOKELMA 10 gram PwPk packet DOSE: 1 PACKET (10 grams) every other day.  Empty entire contents of ONE packet into a glass with 45 mL of water. Stir well and drink immediately; if powder remains in the glass, add water, stir and drink immediately; repeat until no powder remains. (Patient taking differently: DOSE: 1 PACKET (10 grams) every other day.  Empty entire contents of ONE packet into a glass with 45 mL of water. Stir well and drink immediately; if powder remains in the glass, add water, stir and drink immediately; repeat until no powder remains.) 11 packet 0    losartan (COZAAR) 25 MG tablet TAKE 1 TABLET BY MOUTH DAILY. (Patient not taking: Reported on 10/17/2022) 90 tablet 3    magnesium chloride (SLOW_MAG) 64 mg TbEC Take 128 mg by mouth three (3) times a day (at 6am, noon and 6pm). (2 tablets) 9am 1 pm and 9 pm (Patient not taking: Reported on 11/06/2022)      metoPROLOL succinate (TOPROL-XL) 100 MG 24 hr tablet TAKE ONE TABLET TWICE DAILY 180 tablet 1    multivitamin capsule Take 1 capsule by mouth daily.      MYFORTIC 180 mg EC tablet Take 1 tablet (180 mg total) by mouth two (2) times a day. 180 tablet 3    PEN NEEDLE 32 gauge x 5/32 (4 mm) Ndle USE 3 TIMES DAILY BEFORE MEALS 300 each 3    pravastatin (PRAVACHOL) 40 MG tablet Take 1 tablet (40 mg total) by mouth daily. 90 tablet 3    prochlorperazine (COMPAZINE) 10 MG tablet Take 1 tablet (10 mg total) by mouth every six (6) hours as needed for nausea. 30 tablet 6    tacrolimus (PROGRAF) 1 MG capsule Take 3 capsules (3 mg total) by mouth in the morning AND 2 capsules (2 mg total) in the evening. 450 capsule 3    traMADol (ULTRAM) 50 mg tablet Take 1 tablet (50 mg total) by mouth every six (6) hours as needed for pain. 40 tablet 0     No current facility-administered medications for this visit.     Facility-Administered Medications Ordered in Other Visits   Medication Dose Route Frequency Provider Last Rate Last Admin    bevacizumab    PRN (once a day) Melvyn Novas, MD   2.5 mg at 01/28/17 0750     PHYSICAL EXAM:  Vitals:    11/06/22 1049   BP: 128/60   Pulse: 62   Temp: 35.4 ??C (95.7 ??F)     CONSTITUTIONAL: facial pallor with visible thinning of face and arms but alert and in good spirits, making jokes  HEENT: Moist mucous membranes, oropharynx clear without erythema or exudate  NECK: Supple, no lymphadenopathy  CARDIOVASCULAR: Regular, normal S1/S2 heart sounds, no murmurs, no rubs.   PULM: Clear to auscultation bilaterally  EXTREMITIES: Trace B/L woody edema bilaterally  SKIN: 2.5x2.5cm blister over abdomen left of the umbilicus (at site of prior lovenox injection) without surrounding warmth or erythema; Hard eschar over tip of left great toe without fluctuance or surrounding erythema/warmth  MEDICAL DECISION MAKING  Lab Results   Component Value Date    NA 140 11/02/2022    K 4.7 11/02/2022    CO2 28.5 11/02/2022    BUN 33 (H) 11/02/2022    CREATININE 1.24 (H) 11/02/2022    CALCIUM 9.0 11/02/2022    PHOS 3.9 11/02/2022     Lab Results   Component Value Date    WBC 10.6 11/02/2022    HGB 14.3 11/02/2022    PLT 286 11/02/2022     Lab Results   Component Value Date    TACROLIMUS 4.0 (L) 11/02/2022     Lab Results   Component Value Date    A1C 7.1 (H) 08/29/2022

## 2022-11-07 DIAGNOSIS — C259 Malignant neoplasm of pancreas, unspecified: Principal | ICD-10-CM

## 2022-11-07 NOTE — Unmapped (Addendum)
Per pt conversation with Dr Dan Europe on new bx results and cancer dx.  Pt and wife have opted to move to hospice care.  Will have all GI appts canceled.  I spoke with pt and wife via phone to confirm if they have a hospice agency in mind.  They would like to use [facility].  I have placed call to [place] the liaison for [facility] and awaiting call back to see when they have a SOC.  They also have an inpt hospice facility there in [place] should pt need that moving forward.      Was verified they have pain meds until hospice can get them admitted.      4:00 PM  Spoke with [facility] to confirm they will admit pt tomorrow around 9am.

## 2022-11-07 NOTE — Unmapped (Signed)
I just got off the phone with him.  He and his wife are very disappointed, but very clear on the diagnosis and the outcome.    They expressed heavy appreciation for the care of this entire team.  When I expressed to him (and her) of the care and concern within this text thread, he commented that it gave him some comfort in this difficult time.      His pain is being managed by the tramadol (adding on tylenol) that Drenda Freeze gave him and he has enough for now until   Hospice sees him ntomorrow (Thank you Adam).       Overnight, they will decide on   1) do they want me or the hospice physician to manage; they'll speak with the hospice nurses first and then let me know tomorrow.    2) do they need anything else for pain control; will likely talk to hospice nurses first   3) Would they like to stay at home or enter the hospice home and     I will call them tomorrow after their hospice meeeting.   There were both very sad and disappointed,   But they were both very very gracious for this team of caretakers who have (in his words) kept him alive years longer than was expected.      [name], M.D.     [facility] Internal Medicine at [facility]   [address]  [address]  [place], [place]  [address]  [phone]

## 2022-11-07 NOTE — Unmapped (Signed)
Received call from [facility] with [facility] hospice services, she had spoken to patient and patient's spouse and they are requesting that Dr. Dellis Filbert serve as patient's hospice attending provider. Spoke to Dr. Dellis Filbert, who verified he willing to serve as patient's hospice attending provider, confirmed patient has a terminal diagnosis, and is willing to sign comfort orders for patient. Spoke to [facility] with [facility] and confirmed the above.

## 2022-11-08 DIAGNOSIS — C259 Malignant neoplasm of pancreas, unspecified: Principal | ICD-10-CM

## 2022-11-08 NOTE — Unmapped (Signed)
The referrals for GI Surg & Med/Onc have been cancelled per Donnie Aho the pt is going on Hospice care.

## 2022-11-08 NOTE — Unmapped (Signed)
I spoke with patient today.  He's doing ok,  emotionally reports he's doing ok.    Some mild pain, but hospice saw him today and I'm signing off on emergency box for him.  They will want me to manage the initial outpatient hospice orders, which Im happy to do.    I've counseled him about pancreatic cancer genetic testing. He's going to speak with his wife, but I've the labs to be drawn in [place] and if he decides to draw it, I've ordered it.     [name], M.D.     [facility] Internal Medicine at [facility]   [address]  [address]  [place], [place]  [address]  [phone]   ===View-only below th

## 2022-11-09 NOTE — Unmapped (Signed)
As well, spoke with patient and his wife re: code status.  WE had NOT discussed prior.    I explained that hospice wouldn't be appropriate if still full code and full treatment plans.  HOwever, with the terminal illness, and not plans to treat, if our goal is hospice, and if something happened, life support would not appreciably alter the outcome.  He and his wife voice understanding; they willl discuss and will discuss with hospice an d let meknow next weke.    [name], M.D.     [facility] Internal Medicine at [facility]   [address]  [address]  [place], [place]  [address]  [phone]

## 2022-11-09 NOTE — Unmapped (Signed)
Received a call from patient's spouse [name], stating Dr. Dellis Filbert had called yesterday and spoken to the patient but she was not present. Per patient Dr. Dellis Filbert had wanted to speak to both patient and spouse this morning and they are requesting a call back, they will both be home this morning.

## 2022-11-09 NOTE — Unmapped (Signed)
I spoke with patient and his wife.  He is taking tramadol 50 mg with tylenol about three times a day with improvement.    MOderating the pain pretty well.      They are working with hospice and are happy with care hospice is providing.    They are managing emotionally with all of the ilness, diagnosis, prognosis.      I'll continue to be the physician of record for hospice outpatient.  They'll contact me or covering physician over the weekend.      [name], M.D.     [facility] Internal Medicine at [facility]   [address]  [address]  [place], [place]  [address]  [phone]

## 2022-11-12 ENCOUNTER — Telehealth: Admit: 2022-11-12 | Discharge: 2022-11-13 | Payer: MEDICARE

## 2022-11-15 NOTE — Unmapped (Signed)
David Ellis has been contacted in regards to their refill of MYFORTIC 180 mg EC tablet (mycophenolate) and  tacrolimus 1 MG capsule (PROGRAF). At this time, they have declined refill due to  Currently in assistant living unsure when he will return home  . Refill assessment call date has been updated per the patient's request.

## 2022-11-25 NOTE — Unmapped (Signed)
I spoke with patient's wife and offered her my condolences for her loss today.  She rports that the last 48 hours he deteriorated quickly, and had quite a bit of anxiety, but that once he was placed on morphine and some anxiety medicaitons, he was able to be kept comfortable until his passing yesterday morning.      She expressed appreciation for my call and appreciation for the entire medical team that worked so hard for his health.  She expressed that he felt very forutnate to be taken care of by so many wonderful physicians.      I let her know that I would convey this to his medical team and asked her to let me knwo if she had any further questions or concerns.      [name], M.D.     [facility] Internal Medicine at [facility]   [address]  [address]  [place], [place]  [address]  [phone]

## 2022-11-25 NOTE — Unmapped (Signed)
Victorino Dike,   Could you please place him as deceased?    [name], M.D.     [facility] Internal Medicine at [facility]   [address]  [address]  [place], [place]  [address]  [phone]

## 2022-11-26 NOTE — Unmapped (Addendum)
Made aware today via Epic inbasket that Mr David Ellis passed away in hospice care on Saturday 12-09-2022 due to complications of pancreatic cancer. Transplanted kidney was functioning at his time of death.

## 2022-12-03 DEATH — deceased

## 2022-12-04 NOTE — Unmapped (Signed)
I called spouses phone and left David Ellis message expressing my condolences, and letting her know that if there are left over unopened, unexpired supplies that they could be shared w family, friends, or if she was in the area, she could leave at our office for me and I can get to the educators.  We cannot accept insulin.

## 2023-06-05 IMAGING — CT CT HEAD W/O CM
4 series · 16 of 47 positions shown, 18 images · non-contrast
Comparison: 02/02/2019

CLINICAL DATA: Neck trauma.  Three recent falls



[Series 2: head bone · axial · 0.51mm/px · z∈[-181,-145]mm · 3 of 93 slices shown]
[im 10/93  bone]
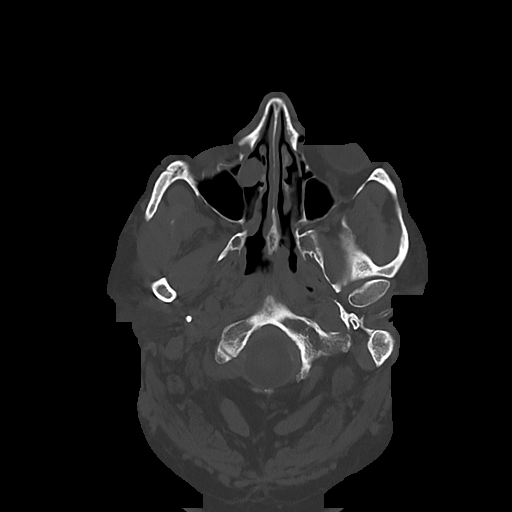
[im 19/93  bone]
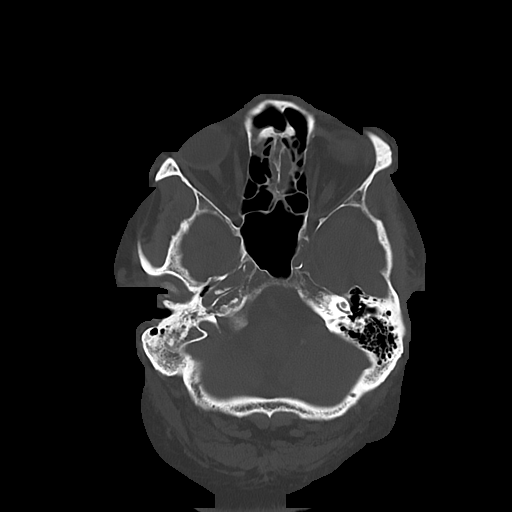
[im 28/93  bone]
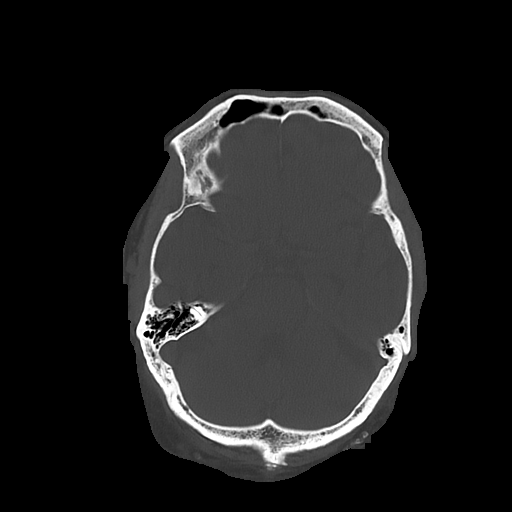

[Series 3: head wo · axial · 0.51mm/px · z∈[-179,-44]mm · 7 of 37 slices shown, 9 images]
[im 5/37  brain]
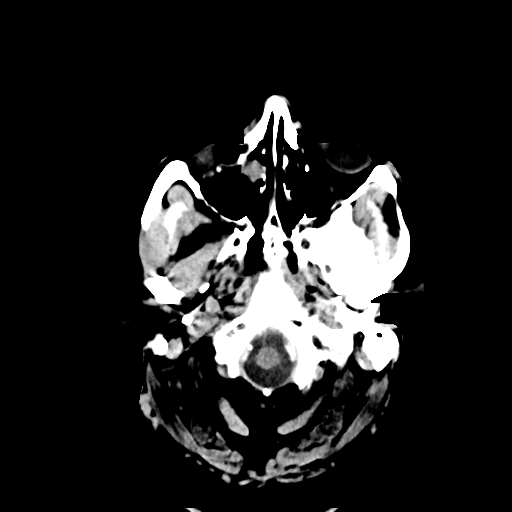
[im 5/37  bone]
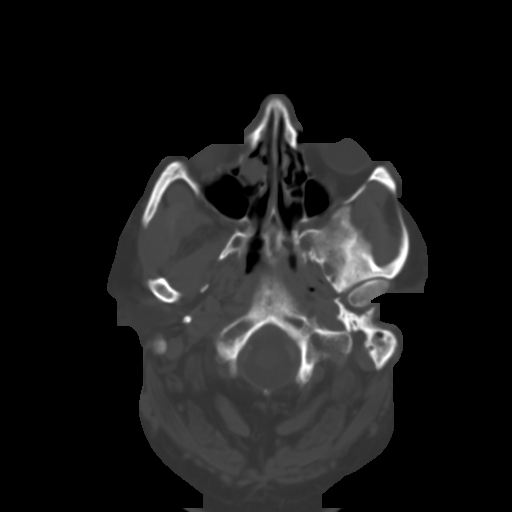
[im 10/37  brain]
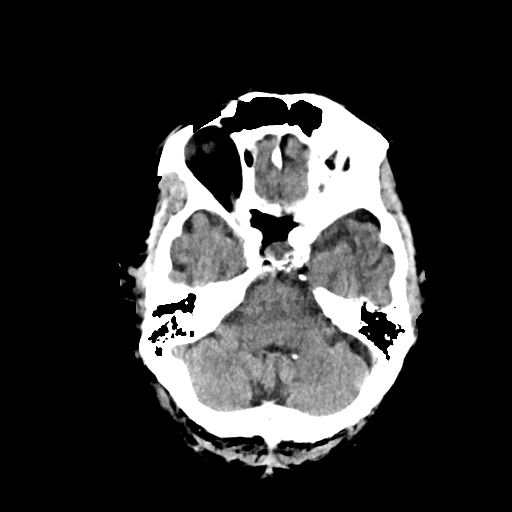
[im 14/37  brain]
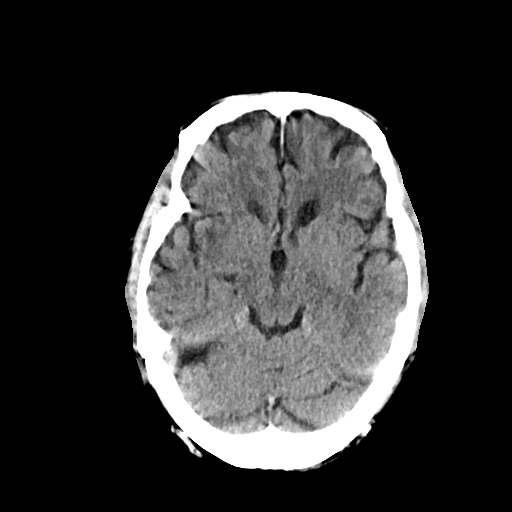
[im 19/37  brain]
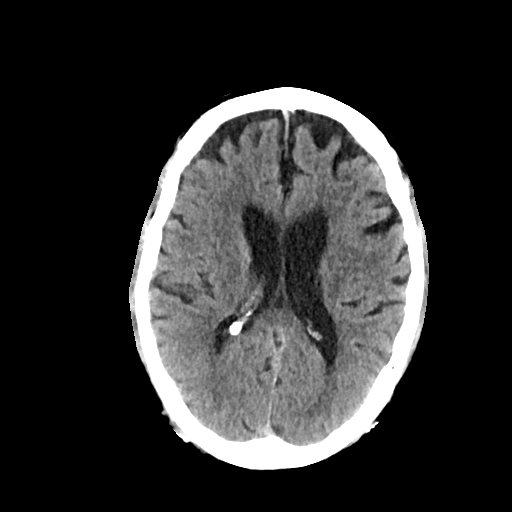
[im 23/37  brain]
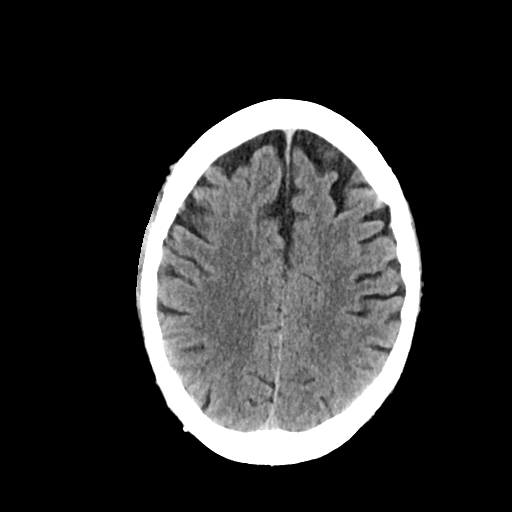
[im 23/37  bone]
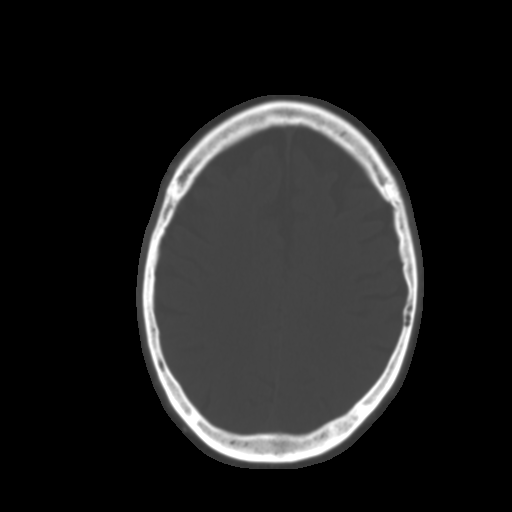
[im 28/37  brain]
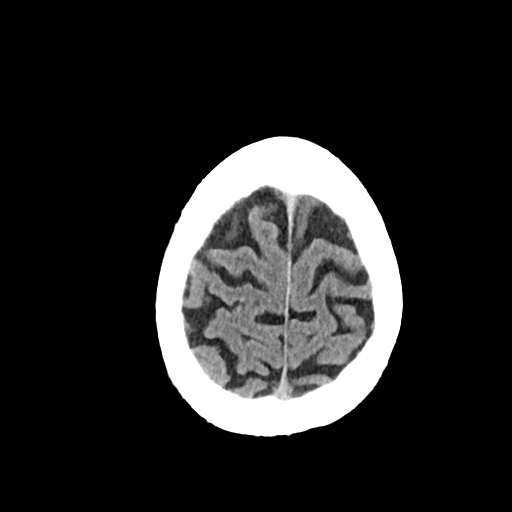
[im 32/37  brain]
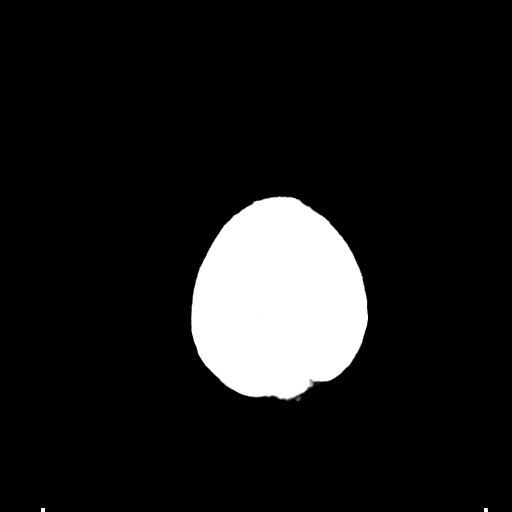

[Series 4: cor soft · coronal · 0.40mm/px · 3 of 76 slices shown]
[im 26/76  brain]
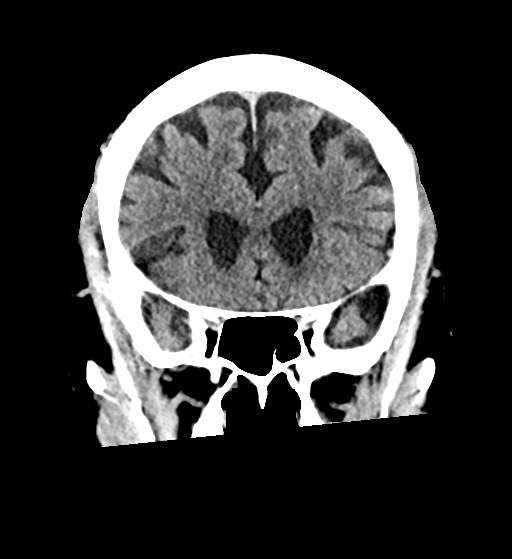
[im 34/76  brain]
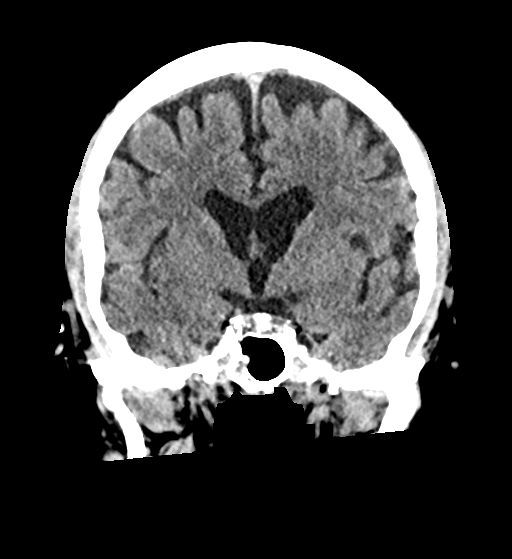
[im 42/76  brain]
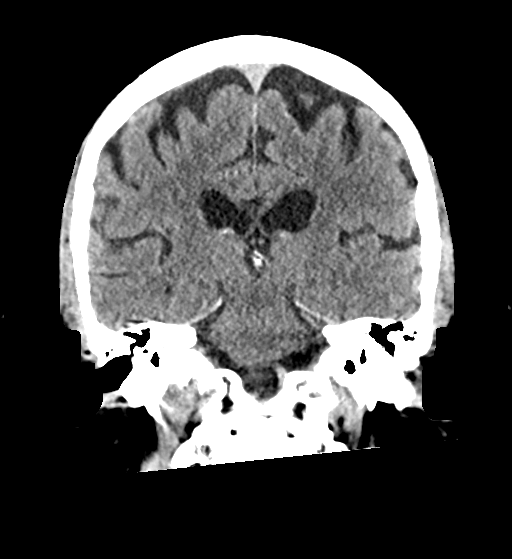

[Series 5: sag soft · sagittal · 0.45mm/px · 3 of 64 slices shown]
[im 22/64  brain]
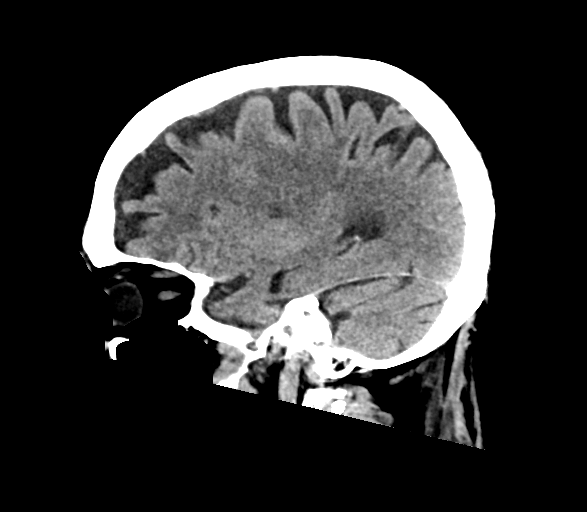
[im 32/64  brain]
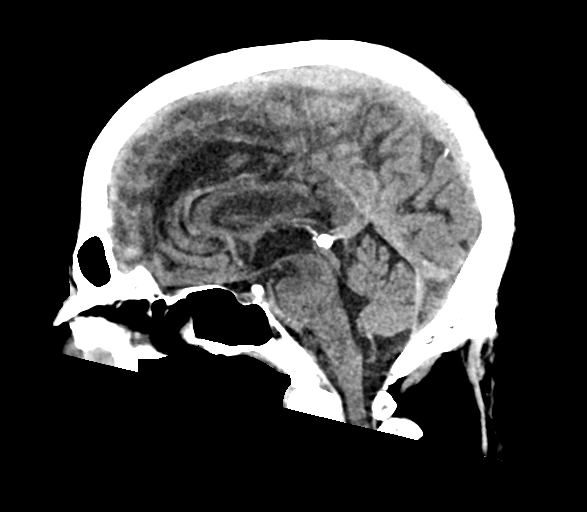
[im 43/64  brain]
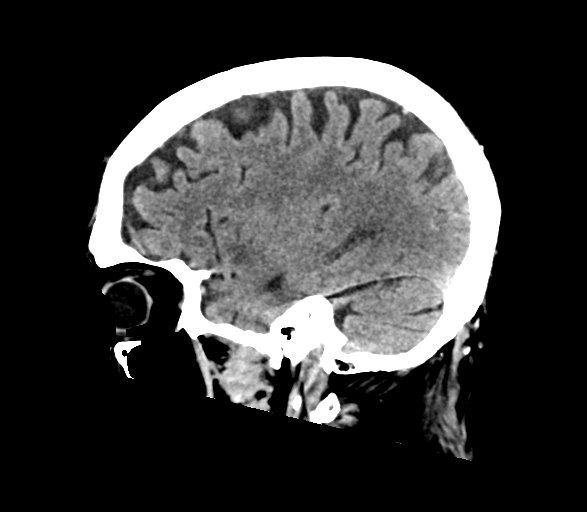

[16 of 47 positions shown; findings below may reference images not displayed]

FINDINGS: CT HEAD FINDINGS

Brain: No evidence of acute infarction, hemorrhage, hydrocephalus,
extra-axial collection or mass lesion/mass effect. Cerebral volume
loss and mild chronic small vessel ischemia in the white matter.

Vascular: No hyperdense vessel or unexpected calcification.

Skull: Negative for fracture

Sinuses/Orbits: No posttraumatic finding. Right anterior nasal
cavity in left nasal cavity/nasopharynx polyps which are similar to
prior. Bilateral cataract resection.

CT CERVICAL SPINE FINDINGS

Alignment: No traumatic malalignment.

Skull base and vertebrae: No acute fracture. No primary bone lesion
or focal pathologic process.

Soft tissues and spinal canal: No prevertebral fluid or swelling. No
visible canal hematoma.

Disc levels: Spondylitic spurring with ankylosis involving most of
the cervical and upper thoracic vertebrae, sparing C2-3 and C6-7.

Upper chest: No visible injury
IMPRESSION: 1. No evidence of acute intracranial or cervical spine injury.
2. Spondylosis with extensive cervicothoracic ankylosis.
3. Nasal polyps.

## 2023-06-05 IMAGING — CR DG ELBOW 2V*R*
1 series · 2 of 2 positions shown · non-contrast
Comparison: No priors.

CLINICAL DATA: 76-year-old male with history of trauma from a fall.
Right elbow pain.

EXAM:
RIGHT ELBOW - 2 VIEW

[Series 1: dg elbow complete right (3+view) · 0.14mm/px · 2 of 2 slices shown]
[im 1/2]
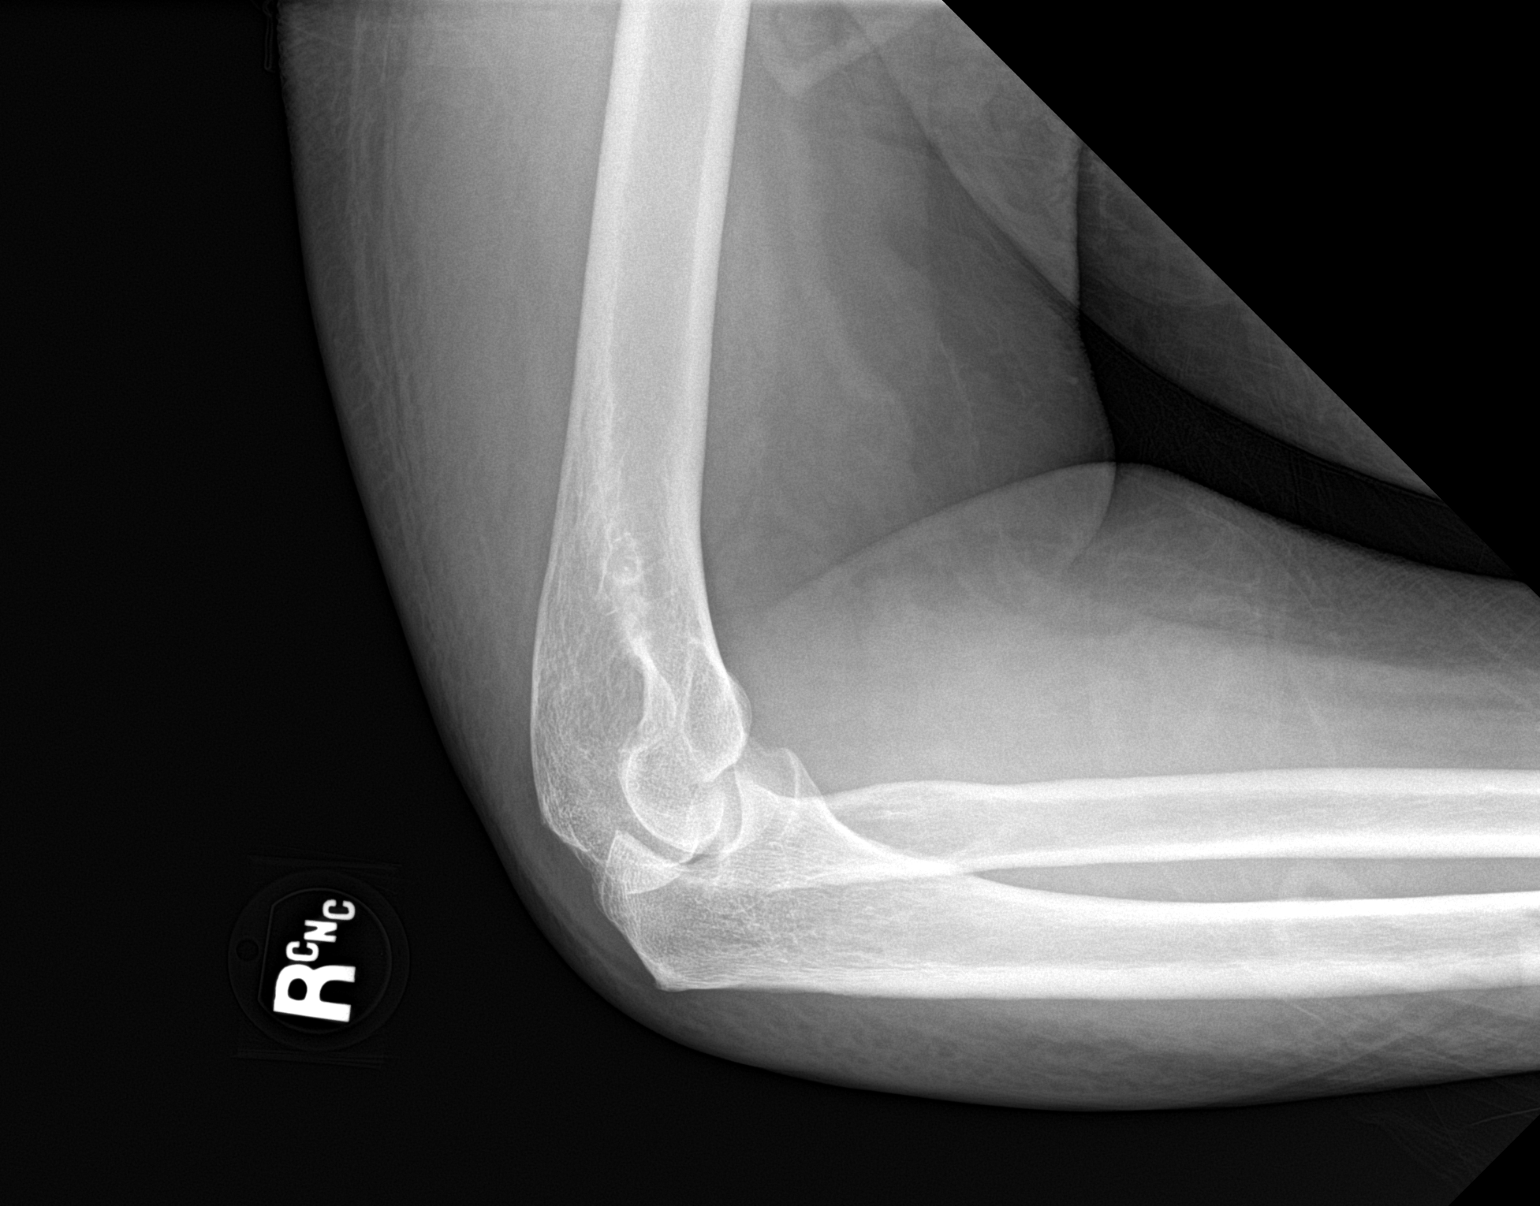
[im 2/2]
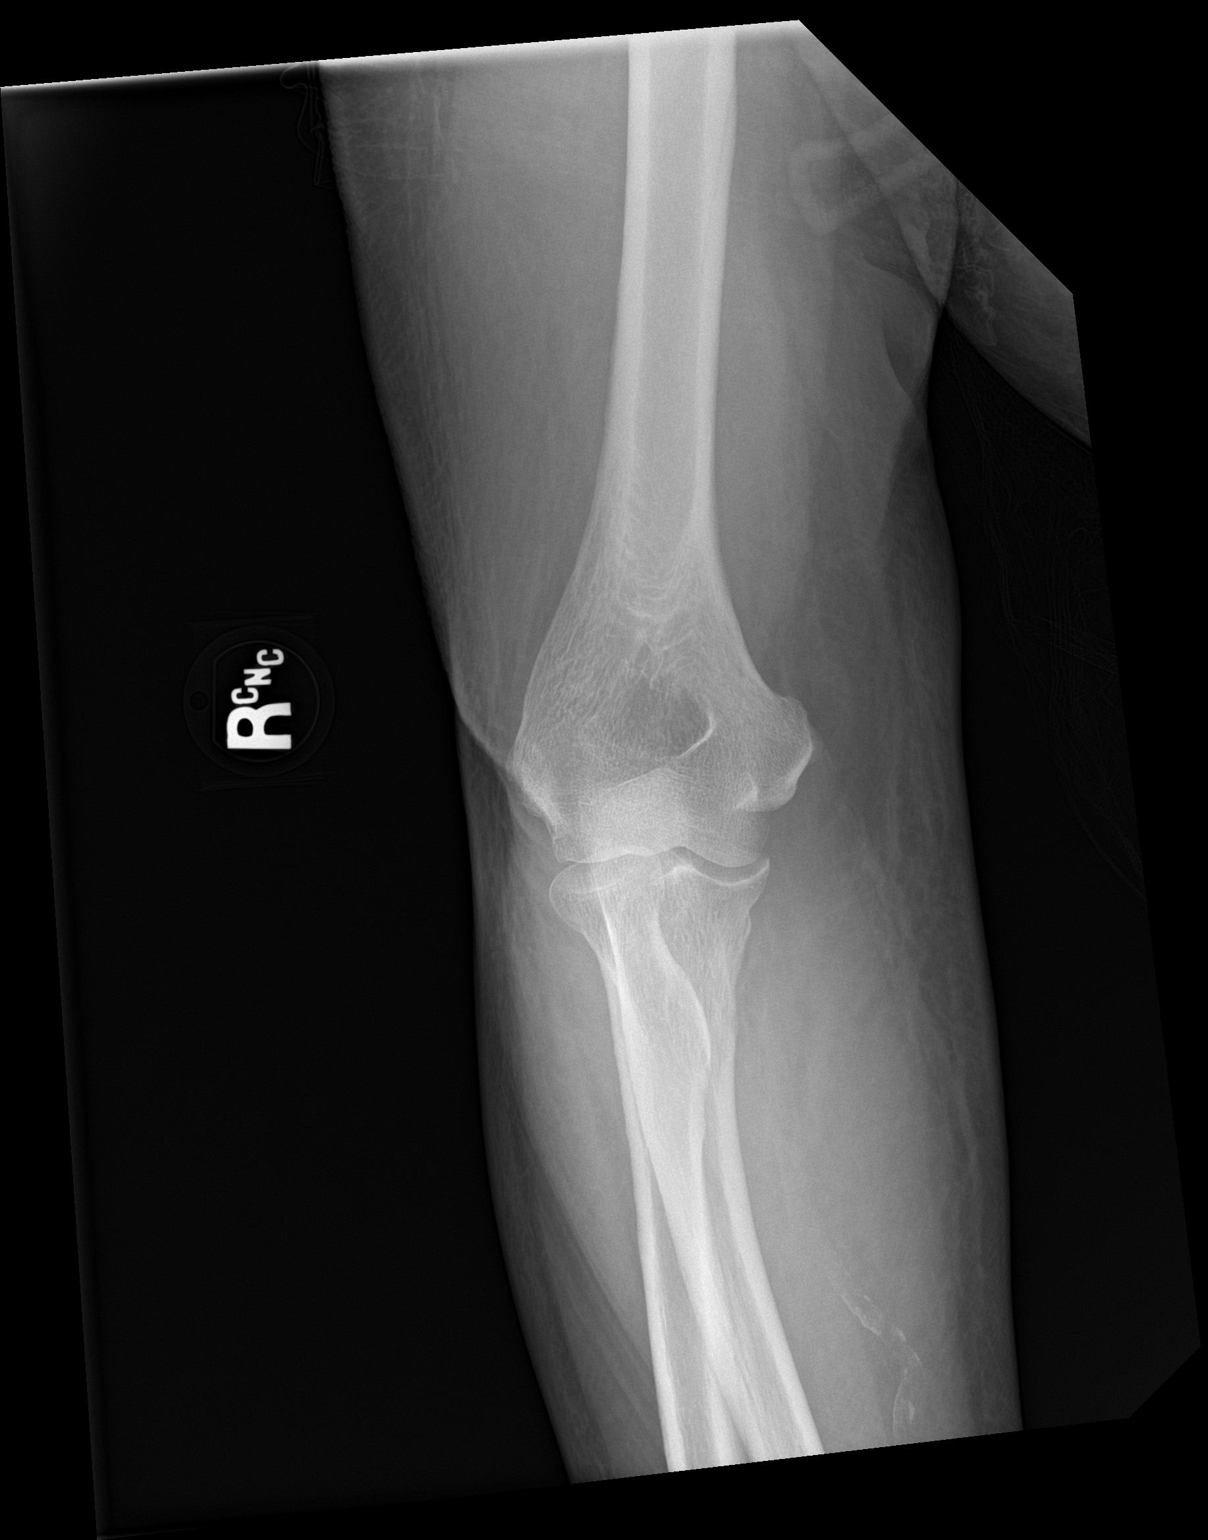

[2 of 2 positions shown; findings below may reference images not displayed]

FINDINGS: 2 nonstandard views of the right elbow demonstrate no definite acute
displaced fracture, subluxation, dislocation, joint or soft tissue
abnormality.
IMPRESSION: 1. No acute radiographic abnormality of the right elbow.

## 2023-06-05 IMAGING — CR DG SHOULDER 2+V*R*
1 series · 2 of 2 positions shown · non-contrast
Comparison: None.

CLINICAL DATA: Fall, shoulder pain

EXAM:
RIGHT SHOULDER - 2+ VIEW

[Series 1: dg shoulder right · 0.14mm/px · 2 of 2 slices shown]
[im 1/2]
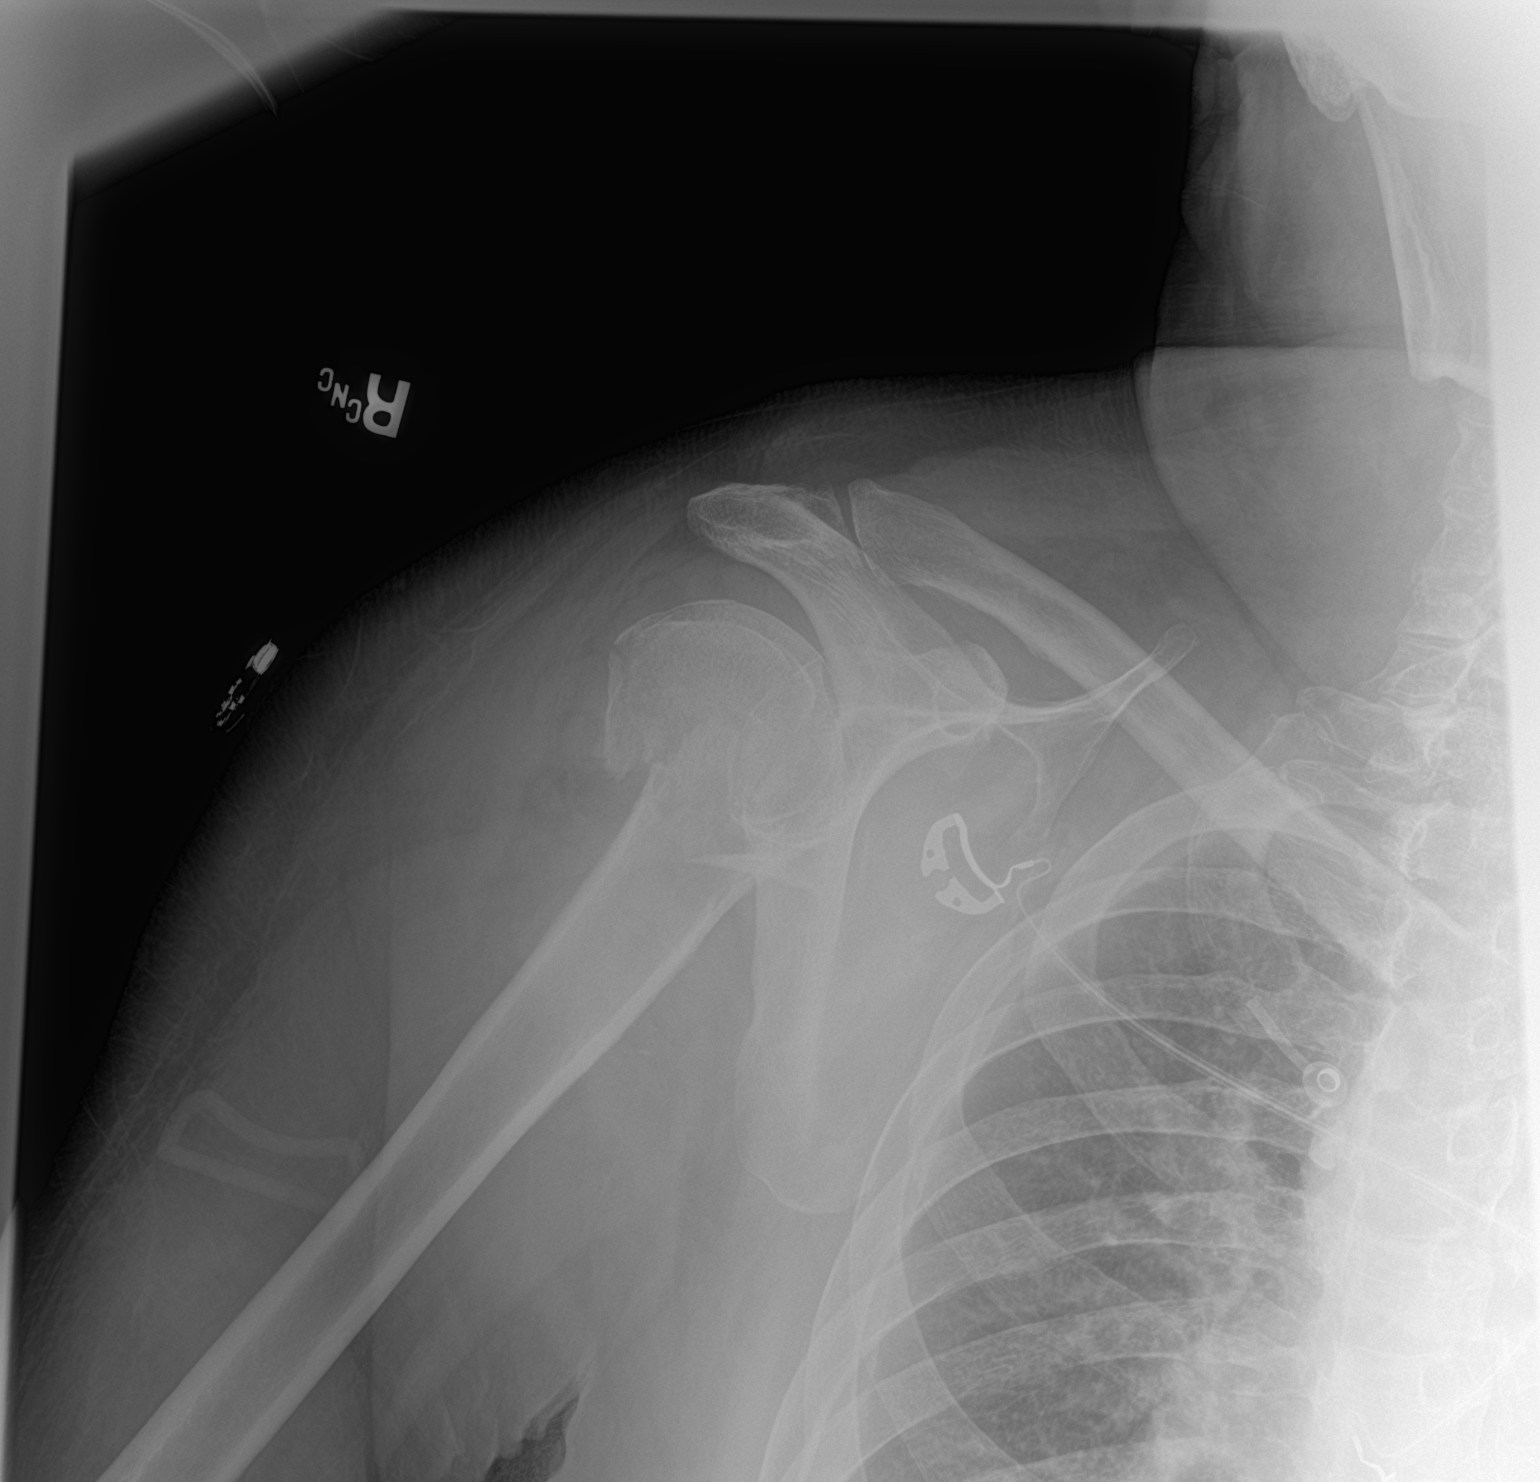
[im 2/2]
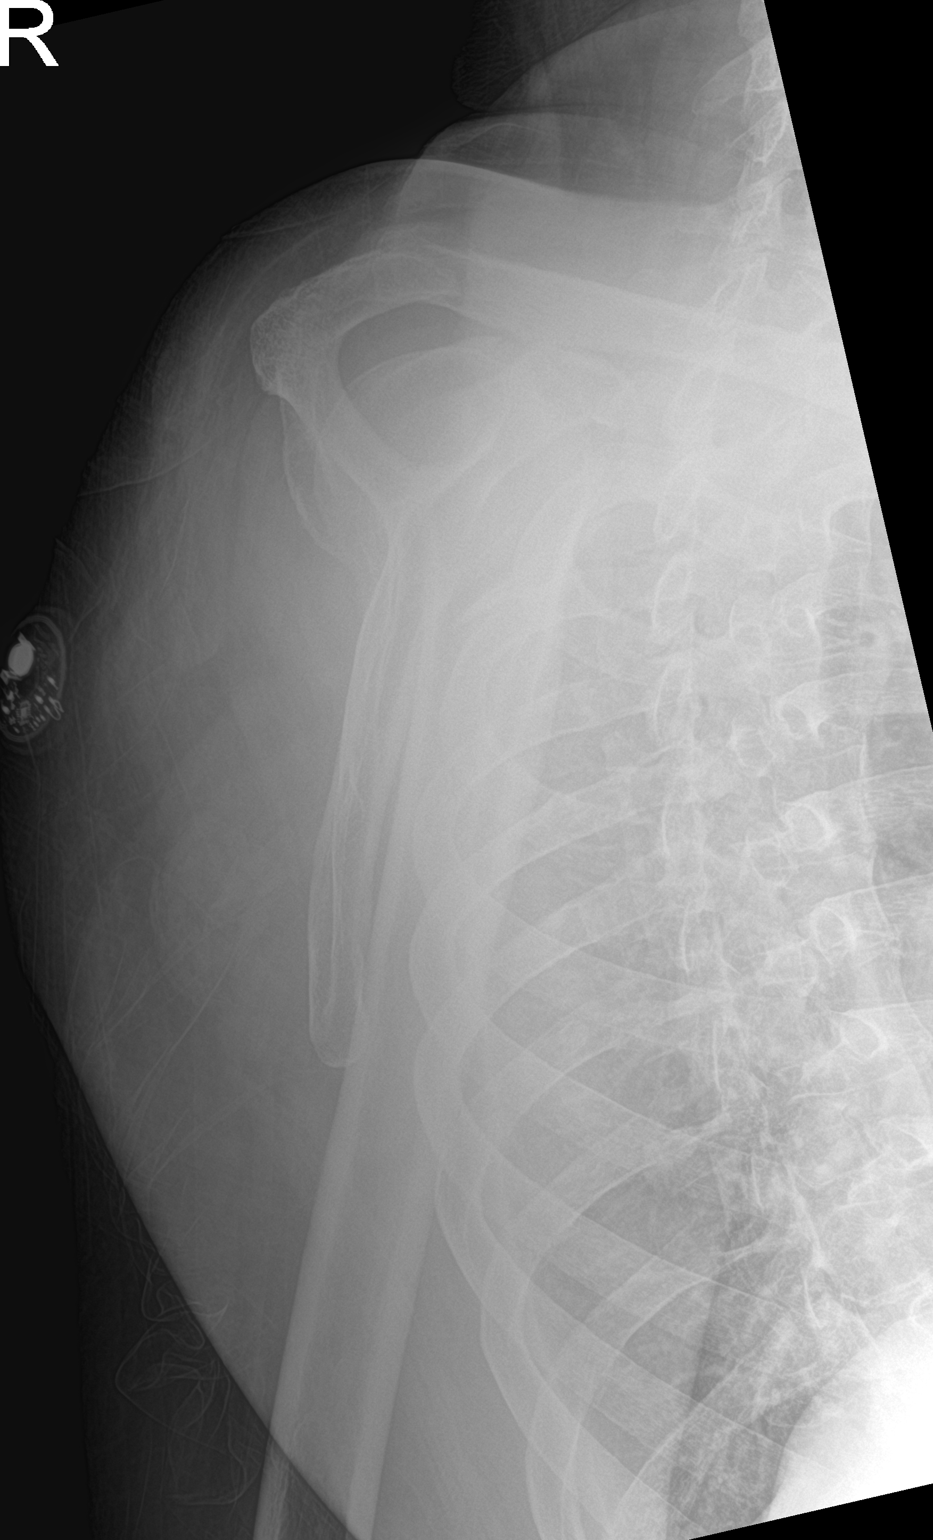

[2 of 2 positions shown; findings below may reference images not displayed]

FINDINGS: Surgical neck fracture of the proximal humerus with the transverse
surgical neck fragment displaced at least 1.2 cm. Relatively
nondisplaced fracture plane noted extending in the expected vicinity
of the greater tuberosity. There is also a triangular 2.7 by 1.0 cm
fragment potentially loose within the joint, projecting over the
expected vicinity of the axillary pouch.
IMPRESSION: 1. At least Neer 2 part fracture of the right proximal humerus, with
a displaced surgical neck fracture component, potentially
nondisplaced greater tuberosity component, and a triangular bony
fragment projecting over the axillary pouch potentially loose within
the joint.

## 2023-06-05 IMAGING — CR DG WRIST 2V*R*
1 series · 2 of 2 positions shown · non-contrast
Comparison: No priors.

CLINICAL DATA: 76-year-old male with history of trauma from a fall.
Right wrist pain.

EXAM:
RIGHT WRIST - 2 VIEW

[Series 1: dg wrist complete right · 0.14mm/px · 2 of 2 slices shown]
[im 1/2]
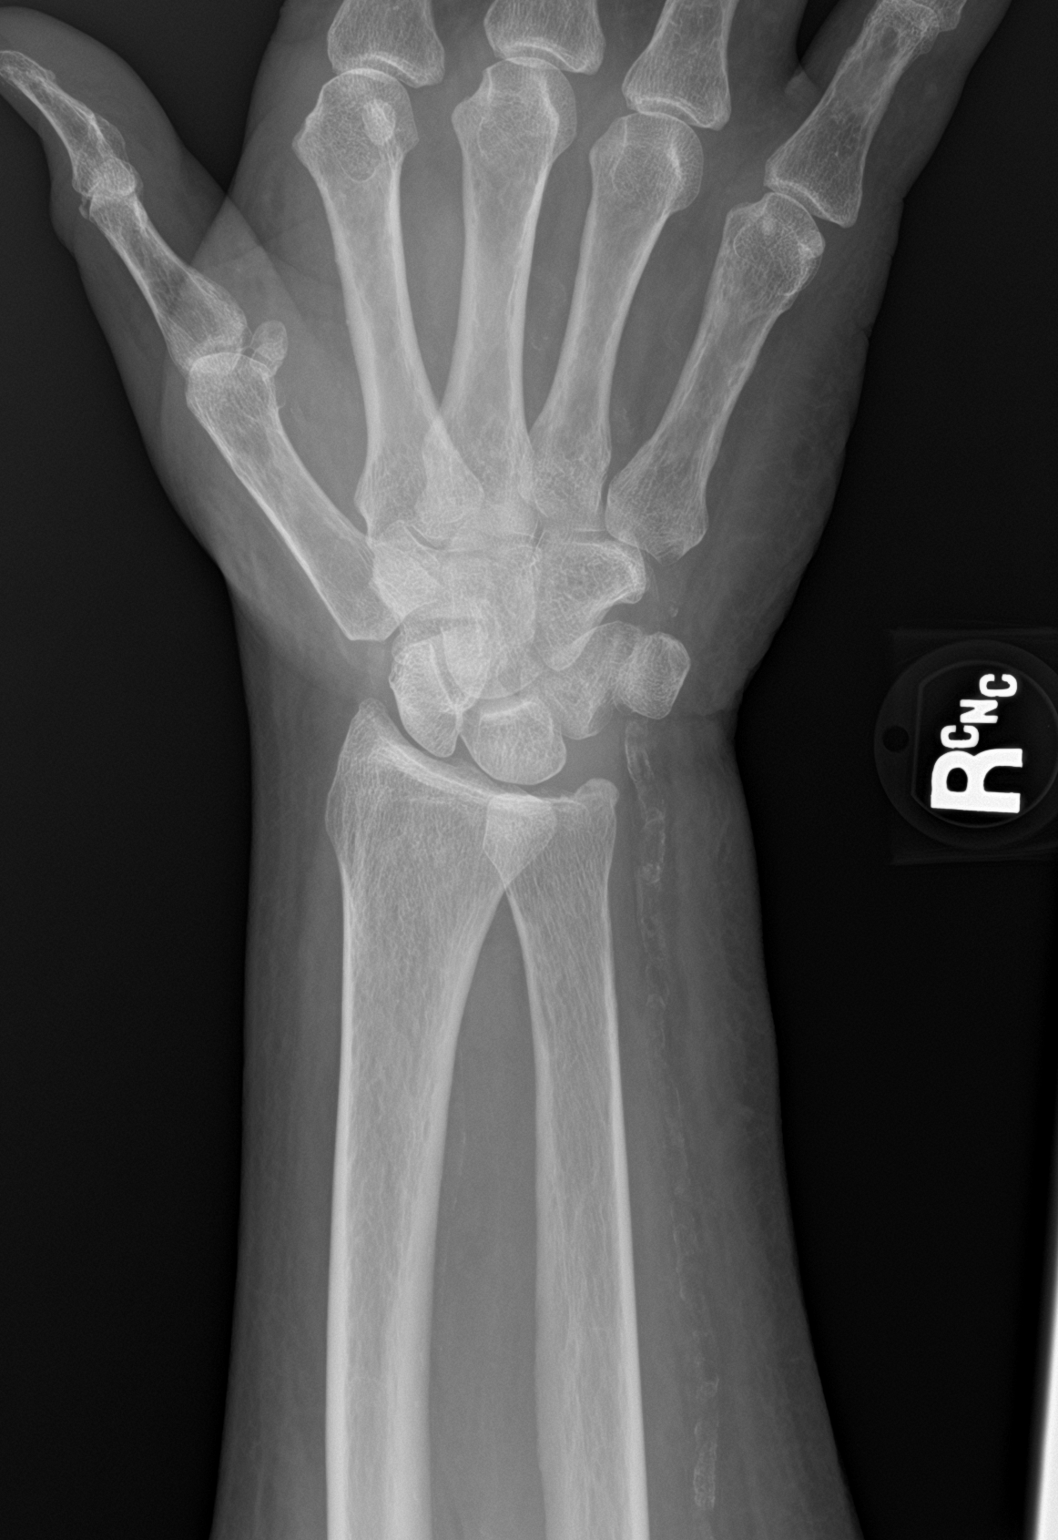
[im 2/2]
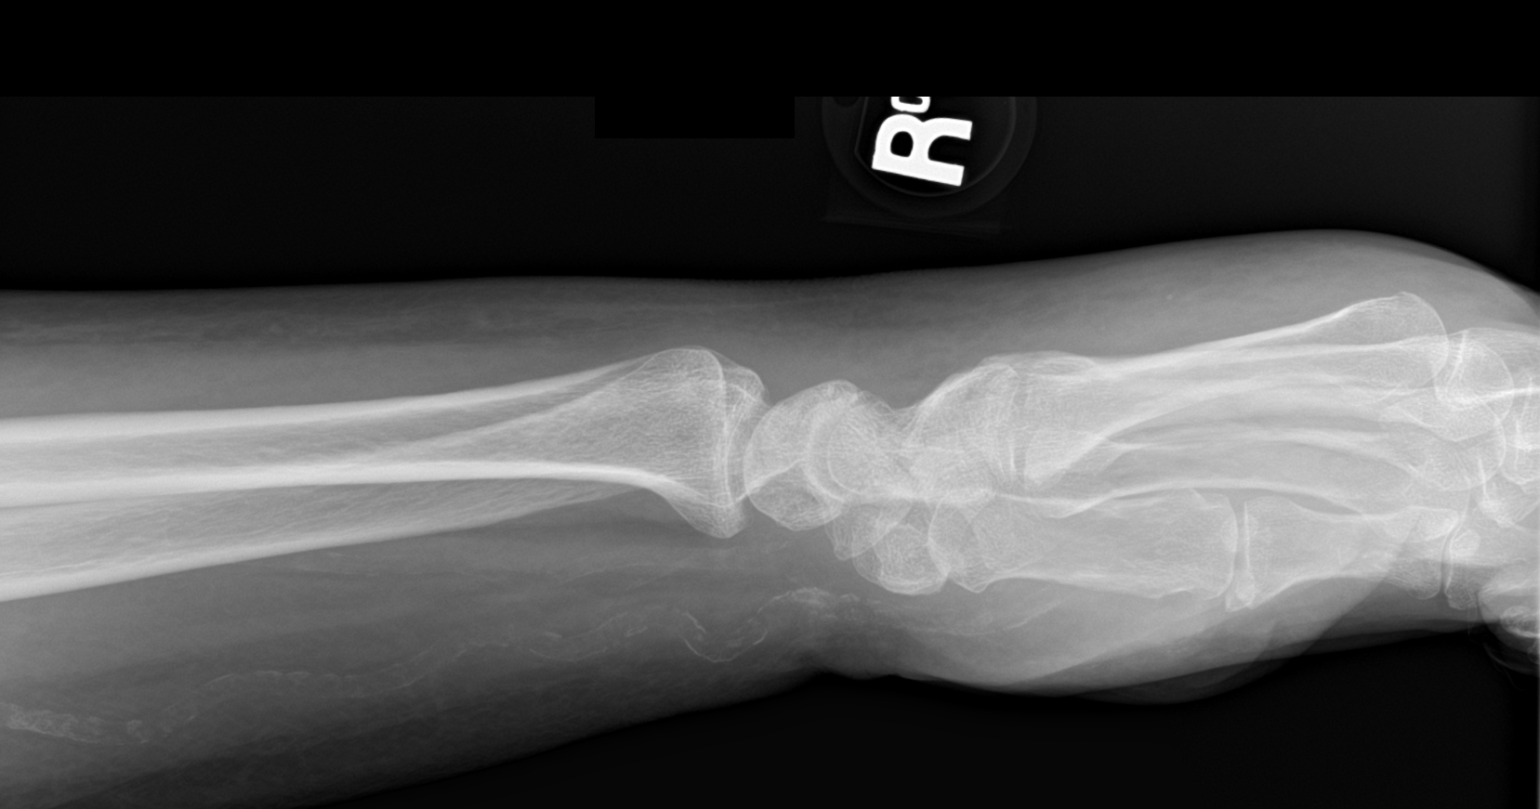

[2 of 2 positions shown; findings below may reference images not displayed]

FINDINGS: Two views of the right wrist demonstrate no acute displaced
fracture, subluxation or dislocation. Numerous vascular
calcifications are noted.
IMPRESSION: 1. No evidence of significant acute traumatic injury to the right
wrist.
2. Atherosclerosis.
